# Patient Record
Sex: Female | Born: 1935
Health system: Southern US, Community
[De-identification: ages and names within clinical notes are randomized; demographics above are authoritative.]

## PROBLEM LIST (undated history)

## (undated) DIAGNOSIS — K5730 Diverticulosis of large intestine without perforation or abscess without bleeding: Secondary | ICD-10-CM

## (undated) DIAGNOSIS — R609 Edema, unspecified: Secondary | ICD-10-CM

## (undated) DIAGNOSIS — R143 Flatulence: Secondary | ICD-10-CM

## (undated) DIAGNOSIS — J3489 Other specified disorders of nose and nasal sinuses: Secondary | ICD-10-CM

## (undated) DIAGNOSIS — R142 Eructation: Secondary | ICD-10-CM

## (undated) DIAGNOSIS — K5289 Other specified noninfective gastroenteritis and colitis: Secondary | ICD-10-CM

## (undated) DIAGNOSIS — E785 Hyperlipidemia, unspecified: Secondary | ICD-10-CM

## (undated) DIAGNOSIS — H3530 Unspecified macular degeneration: Secondary | ICD-10-CM

## (undated) DIAGNOSIS — J069 Acute upper respiratory infection, unspecified: Secondary | ICD-10-CM

## (undated) DIAGNOSIS — I509 Heart failure, unspecified: Secondary | ICD-10-CM

## (undated) DIAGNOSIS — J189 Pneumonia, unspecified organism: Secondary | ICD-10-CM

## (undated) DIAGNOSIS — M810 Age-related osteoporosis without current pathological fracture: Secondary | ICD-10-CM

## (undated) DIAGNOSIS — R197 Diarrhea, unspecified: Secondary | ICD-10-CM

## (undated) DIAGNOSIS — K219 Gastro-esophageal reflux disease without esophagitis: Secondary | ICD-10-CM

## (undated) DIAGNOSIS — R220 Localized swelling, mass and lump, head: Secondary | ICD-10-CM

## (undated) DIAGNOSIS — K5150 Left sided colitis without complications: Secondary | ICD-10-CM

## (undated) DIAGNOSIS — I739 Peripheral vascular disease, unspecified: Secondary | ICD-10-CM

## (undated) DIAGNOSIS — K209 Esophagitis, unspecified without bleeding: Secondary | ICD-10-CM

## (undated) DIAGNOSIS — K222 Esophageal obstruction: Secondary | ICD-10-CM

## (undated) DIAGNOSIS — E859 Amyloidosis, unspecified: Secondary | ICD-10-CM

## (undated) DIAGNOSIS — R141 Gas pain: Secondary | ICD-10-CM

## (undated) DIAGNOSIS — Z800 Family history of malignant neoplasm of digestive organs: Secondary | ICD-10-CM

## (undated) DIAGNOSIS — E039 Hypothyroidism, unspecified: Secondary | ICD-10-CM

## (undated) DIAGNOSIS — C801 Malignant (primary) neoplasm, unspecified: Secondary | ICD-10-CM

## (undated) DIAGNOSIS — J8410 Pulmonary fibrosis, unspecified: Secondary | ICD-10-CM

## (undated) HISTORY — DX: Family history of malignant neoplasm of digestive organs: Z80.0

## (undated) HISTORY — DX: Other specified noninfective gastroenteritis and colitis: K52.89

## (undated) HISTORY — DX: Esophageal obstruction: K22.2

## (undated) HISTORY — DX: Left sided colitis without complications: K51.50

## (undated) HISTORY — DX: Amyloidosis, unspecified: E85.9

## (undated) HISTORY — DX: Diverticulosis of large intestine without perforation or abscess without bleeding: K57.30

## (undated) HISTORY — DX: Edema, unspecified: R60.9

## (undated) HISTORY — DX: Age-related osteoporosis without current pathological fracture: M81.0

## (undated) HISTORY — DX: Gastro-esophageal reflux disease without esophagitis: K21.9

## (undated) HISTORY — DX: Peripheral vascular disease, unspecified: I73.9

## (undated) HISTORY — DX: Pneumonia, unspecified organism: J18.9

## (undated) HISTORY — DX: Esophagitis, unspecified without bleeding: K20.90

## (undated) HISTORY — PX: FOOT SURGERY: SHX648

## (undated) HISTORY — DX: Eructation: R14.2

## (undated) HISTORY — DX: Gas pain: R14.3

## (undated) HISTORY — DX: Hypothyroidism, unspecified: E03.9

## (undated) HISTORY — DX: Unspecified macular degeneration: H35.30

## (undated) HISTORY — DX: Diarrhea, unspecified: R19.7

## (undated) HISTORY — DX: Hyperlipidemia, unspecified: E78.5

## (undated) HISTORY — PX: CATARACT EXTRACTION W/PHACO: SHX586

## (undated) HISTORY — PX: ABDOMINAL HYSTERECTOMY: SHX81

## (undated) HISTORY — DX: Esophagitis, unspecified: K20.9

## (undated) HISTORY — DX: Eructation: R14.1

## (undated) HISTORY — PX: CHOLECYSTECTOMY: SHX55

## (undated) HISTORY — PX: THYROIDECTOMY, PARTIAL: SHX18

## (undated) HISTORY — DX: Flatulence: R14.1

---

## 1997-02-07 ENCOUNTER — Encounter: Payer: Self-pay | Admitting: Gastroenterology

## 1998-11-26 ENCOUNTER — Ambulatory Visit (HOSPITAL_COMMUNITY): Admission: RE | Admit: 1998-11-26 | Discharge: 1998-11-27 | Payer: Self-pay | Admitting: *Deleted

## 1999-02-10 ENCOUNTER — Ambulatory Visit (HOSPITAL_COMMUNITY): Admission: RE | Admit: 1999-02-10 | Discharge: 1999-02-10 | Payer: Self-pay | Admitting: Gastroenterology

## 2002-06-28 ENCOUNTER — Ambulatory Visit (HOSPITAL_COMMUNITY): Admission: RE | Admit: 2002-06-28 | Discharge: 2002-06-28 | Payer: Self-pay | Admitting: Otolaryngology

## 2002-06-28 ENCOUNTER — Encounter: Payer: Self-pay | Admitting: Otolaryngology

## 2004-02-18 ENCOUNTER — Emergency Department (HOSPITAL_COMMUNITY): Admission: EM | Admit: 2004-02-18 | Discharge: 2004-02-18 | Payer: Self-pay | Admitting: Emergency Medicine

## 2004-02-18 ENCOUNTER — Encounter: Payer: Self-pay | Admitting: Emergency Medicine

## 2004-02-19 ENCOUNTER — Ambulatory Visit (HOSPITAL_BASED_OUTPATIENT_CLINIC_OR_DEPARTMENT_OTHER): Admission: RE | Admit: 2004-02-19 | Discharge: 2004-02-19 | Payer: Self-pay | Admitting: Orthopedic Surgery

## 2004-05-01 ENCOUNTER — Other Ambulatory Visit: Admission: RE | Admit: 2004-05-01 | Discharge: 2004-05-01 | Payer: Self-pay | Admitting: Dermatology

## 2004-05-01 ENCOUNTER — Ambulatory Visit: Payer: Self-pay | Admitting: Gastroenterology

## 2004-05-15 ENCOUNTER — Ambulatory Visit: Payer: Self-pay | Admitting: Gastroenterology

## 2004-05-15 DIAGNOSIS — K5730 Diverticulosis of large intestine without perforation or abscess without bleeding: Secondary | ICD-10-CM | POA: Insufficient documentation

## 2004-07-24 ENCOUNTER — Encounter (HOSPITAL_COMMUNITY): Admission: RE | Admit: 2004-07-24 | Discharge: 2004-08-23 | Payer: Self-pay | Admitting: Orthopedic Surgery

## 2004-09-18 ENCOUNTER — Ambulatory Visit: Payer: Self-pay | Admitting: Cardiology

## 2005-07-12 ENCOUNTER — Emergency Department (HOSPITAL_COMMUNITY): Admission: EM | Admit: 2005-07-12 | Discharge: 2005-07-13 | Payer: Self-pay | Admitting: Emergency Medicine

## 2005-07-27 ENCOUNTER — Ambulatory Visit: Payer: Self-pay | Admitting: Gastroenterology

## 2005-07-30 ENCOUNTER — Ambulatory Visit: Payer: Self-pay | Admitting: Gastroenterology

## 2005-08-07 ENCOUNTER — Ambulatory Visit: Payer: Self-pay | Admitting: Gastroenterology

## 2005-08-07 ENCOUNTER — Encounter (INDEPENDENT_AMBULATORY_CARE_PROVIDER_SITE_OTHER): Payer: Self-pay | Admitting: Specialist

## 2005-08-07 DIAGNOSIS — K5150 Left sided colitis without complications: Secondary | ICD-10-CM | POA: Insufficient documentation

## 2005-08-12 ENCOUNTER — Ambulatory Visit: Payer: Self-pay | Admitting: Gastroenterology

## 2005-08-28 ENCOUNTER — Ambulatory Visit: Payer: Self-pay | Admitting: Gastroenterology

## 2006-01-14 ENCOUNTER — Ambulatory Visit: Payer: Self-pay | Admitting: Gastroenterology

## 2006-01-19 ENCOUNTER — Ambulatory Visit: Payer: Self-pay | Admitting: Cardiology

## 2006-04-22 ENCOUNTER — Ambulatory Visit: Payer: Self-pay | Admitting: Cardiology

## 2006-09-11 ENCOUNTER — Emergency Department (HOSPITAL_COMMUNITY): Admission: EM | Admit: 2006-09-11 | Discharge: 2006-09-11 | Payer: Self-pay | Admitting: Emergency Medicine

## 2006-10-05 ENCOUNTER — Encounter: Admission: RE | Admit: 2006-10-05 | Discharge: 2006-10-05 | Payer: Self-pay | Admitting: Pulmonary Disease

## 2006-10-19 ENCOUNTER — Encounter: Admission: RE | Admit: 2006-10-19 | Discharge: 2006-10-19 | Payer: Self-pay | Admitting: Pulmonary Disease

## 2006-10-26 ENCOUNTER — Ambulatory Visit: Payer: Self-pay | Admitting: Gastroenterology

## 2007-03-21 ENCOUNTER — Ambulatory Visit: Payer: Self-pay | Admitting: Gastroenterology

## 2007-04-25 ENCOUNTER — Ambulatory Visit: Payer: Self-pay | Admitting: Cardiology

## 2007-05-09 DIAGNOSIS — E039 Hypothyroidism, unspecified: Secondary | ICD-10-CM | POA: Insufficient documentation

## 2007-05-09 DIAGNOSIS — K5289 Other specified noninfective gastroenteritis and colitis: Secondary | ICD-10-CM | POA: Insufficient documentation

## 2007-05-09 DIAGNOSIS — E785 Hyperlipidemia, unspecified: Secondary | ICD-10-CM | POA: Insufficient documentation

## 2007-05-09 DIAGNOSIS — K219 Gastro-esophageal reflux disease without esophagitis: Secondary | ICD-10-CM | POA: Insufficient documentation

## 2008-01-10 ENCOUNTER — Telehealth: Payer: Self-pay | Admitting: Gastroenterology

## 2008-04-25 ENCOUNTER — Encounter: Payer: Self-pay | Admitting: Gastroenterology

## 2008-06-27 ENCOUNTER — Ambulatory Visit: Payer: Self-pay | Admitting: Cardiology

## 2008-10-16 DIAGNOSIS — M810 Age-related osteoporosis without current pathological fracture: Secondary | ICD-10-CM | POA: Insufficient documentation

## 2008-10-16 DIAGNOSIS — R609 Edema, unspecified: Secondary | ICD-10-CM | POA: Insufficient documentation

## 2008-11-30 ENCOUNTER — Telehealth: Payer: Self-pay | Admitting: Gastroenterology

## 2009-01-03 ENCOUNTER — Ambulatory Visit: Payer: Self-pay | Admitting: Gastroenterology

## 2009-02-26 ENCOUNTER — Telehealth: Payer: Self-pay | Admitting: Gastroenterology

## 2009-04-24 ENCOUNTER — Encounter (INDEPENDENT_AMBULATORY_CARE_PROVIDER_SITE_OTHER): Payer: Self-pay | Admitting: *Deleted

## 2009-05-27 ENCOUNTER — Ambulatory Visit: Payer: Self-pay | Admitting: Gastroenterology

## 2009-05-27 DIAGNOSIS — K222 Esophageal obstruction: Secondary | ICD-10-CM | POA: Insufficient documentation

## 2009-06-06 ENCOUNTER — Ambulatory Visit: Payer: Self-pay | Admitting: Gastroenterology

## 2009-12-24 ENCOUNTER — Ambulatory Visit (HOSPITAL_COMMUNITY): Admission: RE | Admit: 2009-12-24 | Discharge: 2009-12-24 | Payer: Self-pay | Admitting: Pulmonary Disease

## 2010-03-01 ENCOUNTER — Emergency Department (HOSPITAL_COMMUNITY): Admission: EM | Admit: 2010-03-01 | Discharge: 2010-03-01 | Payer: Self-pay | Admitting: Emergency Medicine

## 2010-05-11 ENCOUNTER — Encounter: Payer: Self-pay | Admitting: Pulmonary Disease

## 2010-05-20 NOTE — Letter (Signed)
Summary: [facility] Instructions  [facility]  [place], [place] [address]   Phone: [phone]  Fax: [phone]       Cindy [name]    75-11-1932    MRN: [id number]        Procedure Day /Date:TUESDAY 06/04/2009     Arrival Time:1PM     Procedure Time:2PM     Location of Procedure:                    X   [facility] (4th Floor)   PREPARATION FOR COLONOSCOPY WITH MOVIPREP   Starting 5 days prior to your procedure 05/30/2009 do not eat nuts, seeds, popcorn, corn, beans, peas,  salads, or any raw vegetables.  Do not take any fiber supplements (e.g. Metamucil, Citrucel, and Benefiber).  THE DAY BEFORE YOUR PROCEDURE         DATE: 06/03/2009 DAY: MONDAY  1.  Drink clear liquids the entire day-NO SOLID FOOD  2.  Do not drink anything colored red or purple.  Avoid juices with pulp.  No orange juice.  3.  Drink at least 64 oz. (8 glasses) of fluid/clear liquids during the day to prevent dehydration and help the prep work efficiently.  CLEAR LIQUIDS INCLUDE: Water Jello Ice Popsicles Tea (sugar ok, no milk/cream) Powdered fruit flavored drinks Coffee (sugar ok, no milk/cream) Gatorade Juice: apple, white grape, white cranberry  Lemonade Clear bullion, consomm, broth Carbonated beverages (any kind) Strained chicken noodle soup Hard Candy                             4.  In the morning, mix first dose of MoviPrep solution:    Empty 1 Pouch A and 1 Pouch B into the disposable container    Add lukewarm drinking water to the top line of the container. Mix to dissolve    Refrigerate (mixed solution should be used within 24 hrs)  5.  Begin drinking the prep at 5:00 p.m. The MoviPrep container is divided by 4 marks.   Every 15 minutes drink the solution down to the next mark (approximately 8 oz) until the full liter is complete.   6.  Follow completed prep with 16 oz of clear liquid of your choice (Nothing red or purple).  Continue to  drink clear liquids until bedtime.  7.  Before going to bed, mix second dose of MoviPrep solution:    Empty 1 Pouch A and 1 Pouch B into the disposable container    Add lukewarm drinking water to the top line of the container. Mix to dissolve    Refrigerate  THE DAY OF YOUR PROCEDURE      DATE: 06/04/2009 DAY: TUESDAY  Beginning at 9a.m. (5 hours before procedure):         1. Every 15 minutes, drink the solution down to the next mark (approx 8 oz) until the full liter is complete.  2. Follow completed prep with 16 oz. of clear liquid of your choice.    3. You may drink clear liquids until 12:00PM(2 HOURS BEFORE PROCEDURE).   MEDICATION INSTRUCTIONS  Unless otherwise instructed, you should take regular prescription medications with a small sip of water   as early as possible the morning of your procedure.          OTHER INSTRUCTIONS  You will need a responsible adult at least 75 years of age to accompany you and  drive you home.   This person must remain in the waiting room during your procedure.  Wear loose fitting clothing that is easily removed.  Leave jewelry and other valuables at home.  However, you may wish to bring a book to read or  an iPod/MP3 player to listen to music as you wait for your procedure to start.  Remove all body piercing jewelry and leave at home.  Total time from sign-in until discharge is approximately 2-3 hours.  You should go home directly after your procedure and rest.  You can resume normal activities the  day after your procedure.  The day of your procedure you should not:   Drive   Make legal decisions   Operate machinery   Drink alcohol   Return to work  You will receive specific instructions about eating, activities and medications before you leave.    The above instructions have been reviewed and explained to me by   _______________________    I fully understand and can verbalize these instructions  _____________________________ Date _________

## 2010-05-20 NOTE — Progress Notes (Signed)
Summary: REFILL   Phone Note Call from Patient Call back at Home Phone [phone]   Caller: Patient Call For: [place] Reason for Call: Talk to Nurse Summary of Call: PT WANT TO [place]. PHARMACY IS SUPPOSE TO FAX SOMETHING TO NURSE Initial call taken by: Ronalee Red,  January 10, 2008 3:03 PM  Follow-up for Phone Call        RETURNED PTS CALL, SHE STATED SHE NEEDED A REFILL ON HER [place] FAXED OVER FROM [facility]. SENT IN RX BY FAX ALSO GAVE PT SAMPLES TO LAST TILL HER RX COMES IN Follow-up by: Genella Mech CMA,  January 10, 2008 4:22 PM

## 2010-05-20 NOTE — Miscellaneous (Signed)
Summary: Refill request from pharmacy  Clinical Lists Changes  Medications: Added new medication of LIALDA 1.2 GM TBEC (MESALAMINE) 2 tablets by mouth once daily - Signed Rx of LIALDA 1.2 GM TBEC (MESALAMINE) 2 tablets by mouth once daily;  #60 x 1;  Signed;  Entered by: Genella Mech CMA;  Authorized by: Inda Castle MD;  Method used: Printed then faxed to [facility] [place], [place], [place], [place]  [address], Ph: [phone], Fax: [phone]    Prescriptions: LIALDA 1.2 GM TBEC (MESALAMINE) 2 tablets by mouth once daily  #60 x 1   Entered by:   Genella Mech CMA   Authorized by:   Inda Castle MD   Signed by:   Genella Mech CMA on 04/25/2008   Method used:   Printed then faxed to ...       [facility] (retail)       [place] [address]       [place], [place]  [address]       Ph: [phone]       Fax: [phone]   RxID:   [id number]  Pt needs to call for a yearly follow up appointment

## 2010-05-20 NOTE — Progress Notes (Signed)
Summary: Lialda samples   Phone Note Call from Patient Call back at [facility] [phone]   Call For: DR [facility] Reason for Call: Talk to Nurse Summary of Call: Will be coming into town today-wonders if we have any Lialda samples she can pick up? Initial call taken by: Irwin Brakeman [facility],  November 30, 2008 9:05 AM  Follow-up for Phone Call        returned pts call. Informed her that she needed to make her a follow up appointment. She said when she comes in to get her samples she will make a follow up appointment then. She should be seen on a yearly basis for her colitis Follow-up by: Genella Mech CMA Deborra Medina),  November 30, 2008 9:34 AM

## 2010-05-20 NOTE — Procedures (Signed)
Summary: Gastroenterology Flex  Gastroenterology Flex   Imported By: Marlon Pel 05/09/2007 16:58:19  _____________________________________________________________________  External Attachment:    Type:   Image     Comment:   External Document

## 2010-05-20 NOTE — Procedures (Signed)
Summary: Gastroenterology EGD  Gastroenterology EGD   Imported By: Marlon Pel 05/09/2007 16:57:49  _____________________________________________________________________  External Attachment:    Type:   Image     Comment:   External Document

## 2010-05-20 NOTE — Progress Notes (Signed)
Summary: Sitting out in Wait Room-looking for samples   Phone Note Call from Patient   Caller: Pt is here sitting in [place] Call For: Dr Deatra Ina Reason for Call: Talk to Nurse Summary of Call: Her insurance does not want to cover her medicine. Wonders if we have samples of some alternatives she can try before buying. Initial call taken by: Irwin Brakeman [facility],  February 26, 2009 11:25 AM  Follow-up for Phone Call        Pts insurance will not cover her Lialda. She is suppose to have a colonoscopy is January. gave pt samples to last till her procedure Follow-up by: Genella Mech CMA Deborra Medina),  February 26, 2009 11:36 AM

## 2010-05-20 NOTE — Procedures (Signed)
Summary: Colonoscopy  Patient: Cindy [name] Note: All result statuses are Final unless otherwise noted.  Tests: (1) Colonoscopy (COL)   COL Colonoscopy           [facility]     [place], [place]  [address]           COLONOSCOPY PROCEDURE REPORT           PATIENT:  Cindy [name], Cindy [name]  MR#:  [id number]     BIRTHDATE:  Apr 21, 1932, 76 yrs. old  GENDER:  female           ENDOSCOPIST:  Sandy Salaam. Deatra Ina, MD     Referred by:           PROCEDURE DATE:  06/06/2009     PROCEDURE:  Colonoscopy, Diagnostic     ASA CLASS:  Class II     INDICATIONS:  Elevated Risk Screening, family history of colon     cancer Parent           MEDICATIONS:   Fentanyl 50 mcg IV, Versed 7 mg IV           DESCRIPTION OF PROCEDURE:   After the risks benefits and     alternatives of the procedure were thoroughly explained, informed     consent was obtained.  Digital rectal exam was performed and     revealed no abnormalities.   The LB [id number]-[id number] endoscope     was introduced through the anus and advanced to the cecum, which     was identified by both the appendix and ileocecal valve, without     limitations.  The quality of the prep was excellent, using     MoviPrep.  The instrument was then slowly withdrawn as the colon     was fully examined.     <<PROCEDUREIMAGES>>           FINDINGS:  Mild diverticulosis was found in the sigmoid colon (see     image1 and image11).  Scattered diverticula were found transverse     to sigmoid  This was otherwise a normal examination of the colon     (see image2, image4, image7, image9, image12, image14, and     image15).   Retroflexed views in the rectum revealed no     abnormalities.    The scope was then withdrawn from the patient     and the procedure completed.           COMPLICATIONS:  None           ENDOSCOPIC IMPRESSION:     1) Mild diverticulosis in the sigmoid colon     2) Diverticula, scattered in the transverse to  sigmoid     3) Otherwise normal examination     RECOMMENDATIONS:     1) Given your significant family history of colon cancer, you     should have a repeat colonoscopy in 5 years           REPEAT EXAM:  In 5 year(s) for Colonoscopy.           ______________________________     Sandy Salaam. Deatra Ina, MD           CC:  Velvet Bathe, MD           n.     Lorrin Mais:   Sandy Salaam. Alonie Gazzola at 06/06/2009 10:33 AM  Cindy [name], Cindy [name], [id number]  Note: An exclamation mark (!) indicates a result that was not dispersed into the flowsheet. Document Creation Date: 06/06/2009 10:33 AM _______________________________________________________________________  (1) Order result status: Final Collection or observation date-time: 06/06/2009 10:20 Requested date-time:  Receipt date-time:  Reported date-time:  Referring Physician:   Ordering Physician: Erskine Emery [phone]) Specimen Source:  Source: Cindy [name] Order Number: [id number] Lab site:   Appended Document: Colonoscopy    Clinical Lists Changes  Observations: Added new observation of COLONNXTDUE: 05/2014 (06/06/2009 11:04)

## 2010-05-20 NOTE — Letter (Signed)
Summary: Colonoscopy-Changed to Office Visit Letter  [facility]  [address] [place], [place] [address]   Phone: [phone]  Fax: [phone]      April 24, 2009 MRN: [id number]   Cindy [name] [address] [place], [place]  [address]   Dear Ms. Emory,   According to our records, it is time for you to schedule a Colonoscopy. However, after reviewing your medical record, I feel that an office visit would be most appropriate to more completely evaluate you and determine your need for a repeat procedure.  Please call [phone] (option #2) at your convenience to schedule an office visit. If you have any questions, concerns, or feel that this letter is in error, we would appreciate your call.   Sincerely,  Sandy Salaam. Deatra Ina, M.D.  [facility] [phone]

## 2010-05-20 NOTE — Assessment & Plan Note (Signed)
Summary: annual f/u...as.    History of Present Illness Visit Type: Follow-up Visit Primary GI MD: Erskine Emery MD [facility] Primary Provider: [name], MD Chief Complaint: Follow-up visit, No current GI problem, just here for her yearly visit.  Cindy [name] has returned for her annual visit for left-sided colitis.  She continues to do well on a regimen of lialda.  When she misses doses she claims that she can tell as evidenced by some looseness of her stools or lower abdominal discomfort.  She has had no bleeding.  Last colonoscopy in 2006 demonstrated diverticulosis.   GI Review of Systems      Denies abdominal pain, acid reflux, belching, bloating, chest pain, dysphagia with liquids, dysphagia with solids, heartburn, loss of appetite, nausea, vomiting, vomiting blood, weight loss, and  weight gain.        Denies anal fissure, black tarry stools, change in bowel habit, constipation, diarrhea, diverticulosis, fecal incontinence, heme positive stool, hemorrhoids, irritable bowel syndrome, jaundice, light color stool, liver problems, rectal bleeding, and  rectal pain.    Current Medications (verified): 1)  Lialda 1.2 Gm Tbec (Mesalamine) .... 2 Tablets By Mouth Once Daily 2)  Synthroid 25 Mcg Tabs (Levothyroxine Sodium) .... Take One By Mouth Once Daily 3)  Protonix 40 Mg Tbec (Pantoprazole Sodium) .... Take One By Mouth Once Daily  Allergies (verified): 1)  ! Sulfa 2)  ! * Codine  Past History:  Past Medical History: Reviewed history from 10/16/2008 and no changes required. OSTEOPOROSIS (ICD-733.00) EDEMA (ICD-782.3) HYPERLIPIDEMIA (ICD-272.4) HYPOTHYROIDISM (ICD-244.9) DIARRHEA (ICD-787.91) COLITIS (ICD-558.9) CARCINOMA, COLON, FAMILY HX (ICD-V16.0) ABDOMINAL BLOATING (ICD-787.3) GERD (ICD-530.81) ESOPHAGEAL STRICTURE (ICD-530.3) ESOPHAGITIS (ICD-530.10) DIVERTICULOSIS, COLON (ICD-562.10) COLITIS, ULCERATIVE, UNIVERSAL (ICD-556.6)  Past Surgical History: Abdominal  Hysterectomy-Total Cholecystectomy Thyroidectomy  Family History: Family History of Coronary Artery Disease:  Family History of Diabetes: son, neice, brothers Family History of Colon Cancer:mother Family History of Stomach Cancer:sister  Social History: Reviewed history from 10/16/2008 and no changes required. Retired  Married  Tobacco Use - No.  Alcohol Use - no  Review of Systems  The patient denies allergy/sinus, anemia, anxiety-new, arthritis/joint pain, back pain, blood in urine, breast changes/lumps, change in vision, confusion, cough, coughing up blood, depression-new, fainting, fatigue, fever, headaches-new, hearing problems, heart murmur, heart rhythm changes, itching, menstrual pain, muscle pains/cramps, night sweats, nosebleeds, pregnancy symptoms, shortness of breath, skin rash, sleeping problems, sore throat, swelling of feet/legs, swollen lymph glands, thirst - excessive , urination - excessive , urination changes/pain, urine leakage, vision changes, and voice change.    Vital Signs:  Patient profile:   77 year old female Height:      68 inches Weight:      155.8 pounds BMI:     23.77 Pulse rate:   76 / minute Pulse rhythm:   regular BP sitting:   110 / 60  (left arm) Cuff size:   regular  Vitals Entered By: Bernita Buffy CMA ([place]) (January 03, 2009 9:30 AM)  Physical Exam  Additional Exam:  On physical exam she is a healthy-appearing female  skin: anicteric HEENT: normocephalic; PEERLA; no nasal or pharyngeal abnormalities neck: supple nodes: no cervical lymphadenopathy chest: clear to ausculatation and percussion heart: no murmurs, gallops, or rubs abd: soft, nontender; BS normoactive; no abdominal masses, tenderness, organomegaly rectal: deferred ext: no cynanosis, clubbing, edema skeletal: no deformities neuro: oriented x 3; no focal abnormalities    Impression & Recommendations:  Problem # 1:  COLITIS (ICD-558.9) She has left-sided colitis  diagnosed by  sigmoidoscopy in 2007.  She remains in clinical remission on lialda.  Recommendations #1 continue current regimen  Problem # 2:  CARCINOMA, COLON, FAMILY HX (ICD-V16.0) She has a sibling with colon cancer  Recommendations #1 followup colonoscopy in January, 2011  Patient Instructions: 1)  cc Dr. [name]

## 2010-05-20 NOTE — Assessment & Plan Note (Signed)
Summary: OV RECALL...AS.    History of Present Illness Visit Type: Follow-up Visit Primary GI MD: Erskine Emery MD [facility] Primary Provider: [name], MD Chief Complaint: Schedule colon screening History of Present Illness:   Cindy [name] is return for follow up of her left-sided colitis.  Family history is positive for colon cancer.  She was last examined by colonoscopy 5 years ago.  She continues on lialda 2.4 g daily.  Unfortunately, her insurance will no longer cover this medication.  She has no current GI complaints.   GI Review of Systems      Denies abdominal pain, acid reflux, belching, bloating, chest pain, dysphagia with liquids, dysphagia with solids, heartburn, loss of appetite, nausea, vomiting, vomiting blood, weight loss, and  weight gain.        Denies anal fissure, black tarry stools, change in bowel habit, constipation, diarrhea, diverticulosis, fecal incontinence, heme positive stool, hemorrhoids, irritable bowel syndrome, jaundice, light color stool, liver problems, rectal bleeding, and  rectal pain.    Current Medications (verified): 1)  Lialda 1.2 Gm Tbec (Mesalamine) .... 2 Tablets By Mouth Once Daily 2)  Synthroid 25 Mcg Tabs (Levothyroxine Sodium) .... Take One By Mouth Once Daily 3)  Protonix 40 Mg Tbec (Pantoprazole Sodium) .... Take One By Mouth Once Daily  Allergies (verified): 1)  ! Sulfa 2)  ! * Codine  Past History:  Past Medical History: Reviewed history from 10/16/2008 and no changes required. OSTEOPOROSIS (ICD-733.00) EDEMA (ICD-782.3) HYPERLIPIDEMIA (ICD-272.4) HYPOTHYROIDISM (ICD-244.9) DIARRHEA (ICD-787.91) COLITIS (ICD-558.9) CARCINOMA, COLON, FAMILY HX (ICD-V16.0) ABDOMINAL BLOATING (ICD-787.3) GERD (ICD-530.81) ESOPHAGEAL STRICTURE (ICD-530.3) ESOPHAGITIS (ICD-530.10) DIVERTICULOSIS, COLON (ICD-562.10) COLITIS, ULCERATIVE, UNIVERSAL (ICD-556.6)  Past Surgical History: Reviewed history from 01/03/2009 and no changes  required. Abdominal Hysterectomy-Total Cholecystectomy Thyroidectomy  Family History: Reviewed history from 01/03/2009 and no changes required. Family History of Coronary Artery Disease:  Family History of Diabetes: son, neice, brothers Family History of Colon Cancer:mother Family History of Stomach Cancer:sister  Social History: Reviewed history from 10/16/2008 and no changes required. Retired  Married  Tobacco Use - No.  Alcohol Use - no  Review of Systems       The patient complains of allergy/sinus.    Vital Signs:  Patient profile:   77 year old female Height:      68 inches Weight:      159.38 pounds BMI:     24.32 Pulse rate:   60 / minute Pulse rhythm:   regular BP sitting:   110 / 70  (left arm) Cuff size:   regular  Vitals Entered By: June McMurray CMA Deborra Medina) (May 27, 2009 10:04 AM)   Impression & Recommendations:  Problem # 1:  COLITIS (ICD-558.9) Patient remains in clinical remission.  Mentation is #1 switch from lialda to apriso 1.5 g daily  Problem # 2:  CARCINOMA, COLON, FAMILY HX (ICD-V16.0)  Plan followup colonoscopy  Orders: Colonoscopy (Colon)  Patient Instructions: 1)  cc Dr. [name] 2)  Your colonoscopy is scheduled for 06/04/2009 at 2pm 3)  We are sending your MoviPrep to your pharmacy today 4)  We have sent your your Apriso to your mail order pharmacy 5)  The medication list was reviewed and reconciled.  All changed / newly prescribed medications were explained.  A complete medication list was provided to the patient / caregiver. Prescriptions: MOVIPREP 100 GM  SOLR (PEG-KCL-NACL-NASULF-NA ASC-C) As per prep instructions.  #1 x 0   Entered by:   Genella Mech CMA (AAMA)   Authorized  by:   Inda Castle MD   Signed by:   Genella Mech CMA ([place]) on 05/27/2009   Method used:   Electronically to        [facility] (retail)       [place] [address]       [place], [place]  [address]        Ph: [phone]       Fax: [phone]   RxID:   [id number] APRISO 0.375 GM XR24H-CAP (MESALAMINE) TAKE 4 by mouth once daily  #360 x 3   Entered by:   Genella Mech CMA ([place])   Authorized by:   Inda Castle MD   Signed by:   Genella Mech CMA ([place]) on 05/27/2009   Method used:   Faxed to ...       [facility]order)             , [place]         Ph:        Fax: [phone]   RxID:   [id number]

## 2010-05-20 NOTE — Procedures (Signed)
Summary: Gastroenterology Colon  Gastroenterology Colon   Imported By: Marlon Pel 05/09/2007 16:57:12  _____________________________________________________________________  External Attachment:    Type:   Image     Comment:   External Document

## 2010-07-01 LAB — URINALYSIS, ROUTINE W REFLEX MICROSCOPIC
Bilirubin Urine: NEGATIVE
Glucose, UA: NEGATIVE mg/dL
Ketones, ur: NEGATIVE mg/dL
Nitrite: NEGATIVE
Protein, ur: NEGATIVE mg/dL
Specific Gravity, Urine: <1.005 — ABNORMAL LOW (ref 1.005–1.030)
Urobilinogen, UA: 0.2 mg/dL (ref 0.0–1.0)
pH: 5.5 (ref 5.0–8.0)

## 2010-07-01 LAB — CBC
HCT: 37.7 % (ref 36.0–46.0)
Hemoglobin: 12.7 g/dL (ref 12.0–15.0)
MCH: 31.6 pg (ref 26.0–34.0)
MCHC: 33.7 g/dL (ref 30.0–36.0)
MCV: 93.7 fL (ref 78.0–100.0)
Platelets: 187 10*3/uL (ref 150–400)
RBC: 4.03 MIL/uL (ref 3.87–5.11)
RDW: 12.8 % (ref 11.5–15.5)
WBC: 4.6 10*3/uL (ref 4.0–10.5)

## 2010-07-01 LAB — URINE MICROSCOPIC-ADD ON

## 2010-07-01 LAB — DIFFERENTIAL
Basophils Absolute: 0 10*3/uL (ref 0.0–0.1)
Basophils Relative: 0 % (ref 0–1)
Eosinophils Absolute: 0.1 10*3/uL (ref 0.0–0.7)
Eosinophils Relative: 2 % (ref 0–5)
Lymphocytes Relative: 36 % (ref 12–46)
Lymphs Abs: 1.7 10*3/uL (ref 0.7–4.0)
Monocytes Absolute: 0.5 10*3/uL (ref 0.1–1.0)
Monocytes Relative: 12 % (ref 3–12)
Neutro Abs: 2.3 10*3/uL (ref 1.7–7.7)
Neutrophils Relative %: 50 % (ref 43–77)

## 2010-07-01 LAB — COMPREHENSIVE METABOLIC PANEL
ALT: 14 U/L (ref 0–35)
AST: 23 U/L (ref 0–37)
Albumin: 3.5 g/dL (ref 3.5–5.2)
Alkaline Phosphatase: 51 U/L (ref 39–117)
BUN: 8 mg/dL (ref 6–23)
CO2: 23 mEq/L (ref 19–32)
Calcium: 8.2 mg/dL — ABNORMAL LOW (ref 8.4–10.5)
Chloride: 100 mEq/L (ref 96–112)
Creatinine, Ser: 0.57 mg/dL (ref 0.4–1.2)
GFR calc Af Amer: >60 mL/min (ref >60)
GFR calc non Af Amer: >60 mL/min (ref >60)
Glucose, Bld: 111 mg/dL — ABNORMAL HIGH (ref 70–99)
Potassium: 3.5 mEq/L (ref 3.5–5.1)
Sodium: 130 mEq/L — ABNORMAL LOW (ref 135–145)
Total Bilirubin: 0.5 mg/dL (ref 0.3–1.2)
Total Protein: 6.1 g/dL (ref 6.0–8.3)

## 2010-09-02 NOTE — Letter (Signed)
June 27, 2008    [facility] [address], [place]  [place], [place] [address]   RE:  Cindy, [name]  MRN:  [id number]  /  DOB:  1932/07/10   Dear Cindy [name],   Cindy [name] returns to the office for continued assessment and treatment  of moderately severe hyperlipidemia.  She has had LDL levels around 170-  190.  Unfortunately, she has failed to tolerate any number of  medications including multiple statins, ezetimibe, and now WelChol.  She  started that at 1 tablet t.i.d., advanced to 2 tablets t.i.d., but  subsequently developed myalgias prompting her to reduce the dose to 2  tablets nightly.  Her most recent lipids show a total cholesterol of  286, triglycerides of 74, HDL of 82, and LDL of 189.   She continues to enjoy generally good health.  Ulcerative colitis is  under control with her current medication.  She has been told of  osteopenia or osteoporosis in the past, but this has not been checked  for a number of year.  She apparently did not require pharmacologic  agents in the past.  She has been taking calcium and vitamin D.  Her  gynecologist apparently added higher doses of vitamin D.   PHYSICAL EXAMINATION:  GENERAL:  Trim pleasant woman in no acute  distress.  VITAL SIGNS:  The weight is 162, 2 pounds less than last year.  Blood  pressure 115/70, heart rate 65 and regular, respirations 12 and  unlabored.  NECK:  No jugular venous distention; no carotid bruits.  LUNGS:  Straight back; clear lung fields.  CARDIAC:  Normal first and second heart sounds; minimal systolic  ejection murmur.  ABDOMEN:  Soft and nontender; no bruits.  EXTREMITIES:  No edema; normal distal pulses.   IMPRESSION:  Cindy [name] is doing well in terms of her overall health.  Isolated hyperlipidemia is not highly predictive of vascular disease.  It appears that further pharmacologic trials are pointless.  She will  continue her current regime which consists of red yeast rice, coenzyme  Q, fish oil, and  low-dose WelChol.  You may wish to repeat bone  densitometry to determine if she requires pharmacologic therapy of her  osteoporosis.  I will be happy to see this woman at any time in the  future that you deem appropriate.    Sincerely,      Cristopher Estimable. Lattie Haw, MD, [facility]  Electronically Signed    RMR/MedQ  DD: 06/27/2008  DT: 06/28/2008  Job #: [id number]

## 2010-09-02 NOTE — Assessment & Plan Note (Signed)
[facility] OFFICE NOTE   NAME:Morriss, Bridgette Habermann                       MRN:          [id number]  DATE:10/26/2006                            DOB:          1932/07/01    PROBLEM:  Left-sided colitis.   Ms. Elbert has returned for scheduled GI followup.  On Lialda 2.4 grams  a day, she continues to do extremely well.  She is in clinical  remission.  On occasion when she has missed 2 or 3 days of medicine she  has noted a change in her bowel habits.  Pyrosis is very well controlled  with daily Prevacid.  She has no other complaints.   EXAMINATION:  Pulse 76, blood pressure 100/58, weight 156.   IMPRESSION:  1. Left-sided colitis - in remission.  2. Gastroesophageal reflux disease.   RECOMMENDATIONS:  Patient will consider enrollment in IBD trial.  She  will continue with her current medications.     Sandy Salaam. Deatra Ina, MD,FACG  Electronically Signed    RDK/MedQ  DD: 10/26/2006  DT: 10/26/2006  Job #: [id number]   cc:   Percell Miller L. [facility], M.D.

## 2010-09-02 NOTE — Assessment & Plan Note (Signed)
[facility] OFFICE NOTE   NAME:[name], Cindy Habermann                       MRN:          [id number]  DATE:03/21/2007                            DOB:          [birth year]    PROBLEM LIST:  Diverticulitis.   [name] has returned for a scheduled followup.  She continues to  remain in clinical remission.  She has actually lowered her dose to 1.2  g of Lialda daily because of insurance considerations.  She has no GI  complaints.   PHYSICAL EXAMINATION:  Pulse 68, blood pressure 112/70, weight 159.   IMPRESSION:  Left-sided colitis--in remission.   RECOMMENDATIONS:  Continue Lialda.  I would like her to take 2.4 g a day  since this is the standard dose.     Sandy Salaam. Deatra Ina, MD,FACG  Electronically Signed    RDK/MedQ  DD: 03/21/2007  DT: 03/21/2007  Job #: [id number]   cc:   Percell Miller L. Luan Pulling, M.D.

## 2010-09-02 NOTE — Letter (Signed)
April 25, 2007    [place] Cindy [name], M.D.  [address]  [place]  [place] [address]   RE:  Cindy [name]  MRN:  [id number]  /  DOB:  [birth year]   Dear Cindy [name],   Cindy [name] returns the office 6 months beyond her scheduled appointment.  She decided to try nutritional supplements instead of pharmacologic  agents for her hyperlipidemia after finding that cholestyramine was too  much trouble.  She has been taking red yeast rice, coenzyme Q, and fish  oil.  Not surprisingly, there has been no improvement in her lipid  profile.  A recent total cholesterol was 269 with HDL of 83 and LDL of  165.  She describes no cardiopulmonary symptoms other than peripheral  edema, which is intermittent and mild.   CURRENT MEDICATIONS:  Unchanged from her last visit except for the  agents noted above.   PHYSICAL EXAMINATION:  On exam, a pleasant trim woman in no acute  distress.  The weight is 164, 6 pounds more than last year.  The blood  pressure 110/70, heart rate 70 and regular, respirations 18.  NECK:  There were no jugular venous distention; no carotid bruits.  LUNGS:  Clear.  CARDIAC:  Normal first and second heart sound; grade 3-3/0  basilar systolic ejection murmur.  ABDOMEN:  Soft and nontender; no  organomegaly.  EXTREMITIES:  No edema.   IMPRESSION:  Cindy [name] is doing well overall.  I am beginning to think  that our efforts to manage her hyperlipidemia are fruitless, and likely  doomed to failure.  She would like to try one more pharmacologic agent.  We will treat her with Welchol 1 t.i.d., and then 2 t.i.d. with meals if  well tolerated.  A lipid profile will be obtained in 3 months with a  return office visit 9 months thereafter.   We discussed measures for control of her mild pedal edema.  I believe  this reflects venous disease rather than heart disease.    Sincerely,      Cristopher Estimable. Lattie Haw, MD, [facility]  Electronically Signed    RMR/MedQ  DD: 04/25/2007  DT: 04/25/2007  Job #:  [id number]

## 2010-09-05 NOTE — Letter (Signed)
April 22, 2006    Percell Miller L. Luan Pulling, M.D.  [address]  [place], [place] [address]   RE:  Cindy [name], Cindy [name]  MRN:  [id number]  /  DOB:  [birth year]   Dear Ed:   [name] returns to the office for treatment of hyperlipidemia.  She  took pravastatin 20 mg daily with good results over the past 3 months,  but suffered leg discomfort.  She has discontinued the medicine over the  past 3 days with marked improvement of her symptoms.  She also continues  to be concerned about the possibility of hepatic damage.  Otherwise, she  has done well since her last visit without any significant medical  issues.   MEDICATIONS:  Are unchanged from her last visit except for the temporary  addition of pravastatin.   EXAMINATION:  A pleasant, trim woman in no acute distress.  The weight is 158, 4 pounds more than in October.  Blood pressure  110/70, heart rate 64 and regular.  Respirations 16.  NECK:  No jugular venous distention; normal carotid upstrokes without  bruits.  CARDIAC:  Normal 1st and 2nd heart sounds; 4th heart sound present.  ABDOMEN:  Soft and nontender; no bruits.  LUNGS:  Clear.  EXTREMITIES:  No edema; normal distal pulses.   IMPRESSION:  [name] has not tolerated 2 statins.  We could try a  third drug at low dose on a every other day basis, but I doubt that this  will be successful.  We will start cholestyramine at a dose of 4 g  t.i.d.  A repeat lipid profile will be obtained in 1 month.  At that  point, I suspect that the addition of ezetimibe will be necessary.  I  will plan a return office visit in 6 months.    Sincerely,      Cristopher Estimable. Lattie Haw, MD, [facility]  Electronically Signed    RMR/MedQ  DD: 04/22/2006  DT: 04/22/2006  Job #: [id number]

## 2010-09-05 NOTE — Op Note (Signed)
Cindy [name], Cindy [name]                ACCOUNT NO.:  [id number]   MEDICAL RECORD NO.:  [id number]          PATIENT TYPE:  AMB   LOCATION:  [place]                          FACILITY:  [facility]   PHYSICIAN:  Youlanda Mighty. Sypher Brooke Bonito., M.D.DATE OF BIRTH:  08/04/1932   DATE OF PROCEDURE:  02/19/2004  DATE OF DISCHARGE:                                 OPERATIVE REPORT   PREOPERATIVE DIAGNOSIS:  Comminuted fracture of right distal radius with  significant impaction and dorsal angulation.   POSTOPERATIVE DIAGNOSIS:  Comminuted fracture of right distal radius with  significant impaction and dorsal angulation.   OPERATION:  Open reduction and internal fixation of right radius utilizing a  seven peg DVR plate system.   SURGEON:  Youlanda Mighty. Sypher, M.D.   ASSISTANT:  Leverne Humbles, P.A.-C.   ANESTHESIA:  General by LMA/infraclavicular block.   ANESTHESIOLOGIST:  Jessy Oto. Albertina Parr, M.D.   INDICATIONS FOR PROCEDURE:  Cindy [name] is a 77 year old woman who fell on  the afternoon of February 18, 2004, and sustained an obvious fracture of her  right wrist.  She was initially evaluated at [facility] in  [place] where x-rays revealed a comminuted fracture.  She subsequently  was transferred to [facility] emergency room  where she was seen by Dr. Zenia Resides,  attending emergency room physician.  Dr. Zenia Resides reviewed her films and  obtained a hand trauma consult.  After informed consent, she was placed in a  sugar tong splint as she was not n.p.o.  We made arrangements for open  reduction and internal fixation of her wrist at this time.  We advised Ms.  [name] to proceed with open reduction and internal fixation to obtain and  maintain a near anatomic reduction of her fracture and to facilitate early  mobilization of her wrist and hand.  After informed consent, she is brought  to the operating room at this time.   PROCEDURE:  Cindy [name] is brought to the operating room and placed in  supine  position on the operating table.  Following anesthesia consultation  by Dr. Albertina Parr, an infraclavicular block was placed.  She had some  residual discomfort and was uncomfortable lying upon the operating table,  therefore, it was decided to proceed with general anesthesia by LMA.  1 gram  Ancef was administered as an IV prophylactic antibiotic.  Following  exsanguination of the right arm with an Esmarch bandage, the arterial  tourniquet on the proximal brachium was inflated to 230 mmHg.  The procedure  commenced with a standard DVR volar incision.  The subcutaneous tissues were  carefully divided taking care to identify and electrocauterize transverse  veins.  The fascia overlying the flexor carpi radialis was incised  longitudinally followed by release of the floor of the flexor carpi radialis  compartment.  The flexor pollicis longus was retracted in an ulnar direction  and the radial artery gently retracted in a radial direction.  The pronator  quadratus was elevated off the fracture site.  Clot and other debris was  cleared from the fracture site followed by three point molding with traction  to obtain an anatomic reduction.  A seven peg DVR plate was placed with  provisional fixation with a single screw in the sliding slot.  Anatomic  position of the plate was achieved.  The plate was then snugged down and we  proceeded to apply the seven pegs.  A combination of threaded pegs were used  in the proximal row particularly in the radial styloid and smooth pegs on  the ulnar aspect of the fracture.  During tightening of the plate, we got  out of phase, perhaps 3 degrees with respect to the position of the plate  via the radial endplate, however, this was inconsequential and ultimately  excellent fixation of the pegs was achieved anatomically securing the distal  fracture fragments.  The advantage of this position was that we had better  support of the lunate facet with the plate in a  slightly radial rotated  position.  The wound was then thoroughly lavaged with sterile saline  followed by placement of the remaining three cortical screws to secure the  plate to the shaft of the radius.  The pronator quadratus was then repaired  with a mattress suture of 0 Vicryl to the brachial radialis fascia and  periosteum followed by repair of the skin with subdermal sutures of 3-0  Vicryl and intradermal 3-0 Prolene.  A compressive dressing was applied with  the sugar tong splint maintaining the wrist in slight supination.   For aftercare, Cindy [name] has been given prescriptions for Percocet and  Motrin in the emergency room .  She is given Keflex 500 mg 1 p.o. q.8h. x 4  days as a prophylactic antibiotic.      [place]   RVS/MEDQ  D:  02/19/2004  T:  02/19/2004  Job:  [id number]   cc:   Percell Miller L. Luan Pulling, M.D.  [place]  [place] [address]  Fax: [phone]

## 2010-09-05 NOTE — Consult Note (Signed)
NAMETREVA, HUYETT NO.:  [id number]   MEDICAL RECORD NO.:  [id number]          PATIENT TYPE:  EMS   LOCATION:  [place]                         FACILITY:  [facility]   PHYSICIAN:  Youlanda Mighty. Sypher Brooke Bonito., M.D.DATE OF BIRTH:  1933/02/25   DATE OF CONSULTATION:  02/18/2004  DATE OF DISCHARGE:                                   CONSULTATION   CHIEF COMPLAINT:  Pain right wrist status post fall.   REFERRING PHYSICIAN:  Dr. Elberta Fortis T. Zenia Resides, attending emergency room  physician.   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  She developed immediate pain, swelling,  ecchymosis and deformity.  She was transported to [facility] emergency room  where she was evaluated by Dr. Lacretia Leigh who made the clinical diagnosis  of a probable wrist fracture.   X-rays were obtained documenting a comminuted intra-articular displaced  dorsally angulated and impacted fracture of the radius and an ulnar styloid  fracture.   Dr. Zenia Resides requested an upper extremity orthopedic consult.   PAST MEDICAL HISTORY:  Cindy [name]' past medical history is briefly reviewed.  She is basically healthy.   CLINICAL EXAMINATION:  Confirmed that she is an awake and alert 77 year old  woman. Inspection of her arm revealed profound swelling, ecchymosis and a  silver fork deformity.   She was noted to have intact sensibility in the medial ulnar and radial  distribution and intact function of her extrinsic and intrinsic muscles  innervated by the median ulnar and radial nerves.   After informed consent with her son and husband, I recommended proceeding  with casting in situ followed by anticipated reconstruction of her wrist  with a DVR volar plate system.   Arrangements will be made for her to be admitted to the outpatient center in  24 to 48 hours for elective reconstruction  of her wrist after she is n.p.o.  and properly evaluated from a medical stand-point.   After informed consent she was placed in a sugar tong splint maintaining her  fracture in situ.  Once this set she was advised to begin icing.  She was  given prescriptions for Vicodin 5 mg 1 p.o. q.4-6h p.r.n. pain #6 tablets  from the emergency room as a pre-pack.  Also a prescription for Percocet 5/325 1 p.o. q.4h p.r.n. pain #24 tablets  without refill, and Motrin 600 mg 1 p.o. q.6h p.r.n. pain, a total of 20  tablets with one refill.   We will contact her by phone within 24 hours to make arrangements for her  elective reconstructive surgery.      [place]   RVS/MEDQ  D:  02/18/2004  T:  02/19/2004  Job:  [id number]

## 2010-09-05 NOTE — Letter (Signed)
January 19, 2006     Renard Matter, M.D.  [address] [place], [place] [address]   RE:  ALOURA, MATSUOKA  MRN:  [id number]  /  DOB:  June 11, 1932   Dear Cindy [name]:   It is my pleasure reassessing Cindy [name] in the office today at your  request.  As you know, I saw her approximately a year ago for  hyperlipidemia.  She has been reluctant to treat this pharmacologically due  to a previous adverse reaction to atorvastatin.  At the time, I felt  continued dietary measures were appropriate.  She now returns with an even  higher total cholesterol and LDL as well as a lower HDL level.  She remains  asymptomatic.  There is no history of vascular disease.  Updating interval  events, she has had a fairly severe episode of ulcerative colitis that has  now been controlled with medical therapy.   Current medications are unchanged from her last visit except for the  addition of a medication for her inflammatory bowel disease.   PHYSICAL EXAMINATION:  GENERAL:  A trim, pleasant woman.  The weight is 154,  1 pound less than last year.  Blood pressure 100/60, heart rate 80 and  regular, respirations 16.  NECK:  No jugular venous distention; normal carotid upstrokes without  bruits.  LUNGS:  Clear.  CARDIAC:  Normal first and second heart sounds; prominent fourth heart  sound.  ABDOMEN:  Soft and nontender; aortic pulsation not palpable; no bruits.  EXTREMITIES:  No vascular bruits; normal distal pulses.   The most recent lipid profile shows total cholesterol of 288, LDL of 182,  HDL of 88.  Triglycerides are 89.   IMPRESSION:  Cindy [name] remains free of evidence for vascular disease and  cardiovascular risk factors except for her dyslipidemia, her age, and her  use of Premarin.  The latter is undoubtedly contributing to her high HDL  value.  She approaches treatment recommendations for LDL, for which values  above 190 can be treated pharmacologically.  I suggested that we try  Pravastatin  at a low dose.  A repeat lipid profile will be obtained in one  month.  I will reassess this nice woman again in three months.    Sincerely,     Cristopher Estimable. Lattie Haw, MD, [facility]     RMR/MedQ  DD:  01/19/2006  DT:  01/20/2006  Job #:  [id number]   CC:    Percell Miller L. Luan Pulling, M.D.

## 2010-09-05 NOTE — Assessment & Plan Note (Signed)
[facility] OFFICE NOTE   NAME:[name], Cindy Beam                       MRN:          [id number]  DATE:01/14/2006                            DOB:          27-Oct-1932    PROBLEM:  Left sided colitis.   Cindy [name] has returned for scheduled follow up.  On Lialda 2.4 grams a day,  she is feeling quite well.  She is in clinical remission.   PHYSICAL EXAMINATION:  VITAL SIGNS:  Pulse 72, blood pressure 122/64, weight  156.   IMPRESSION:  Left sided colitis - in remission.   RECOMMENDATIONS:  Continue Lialda.       Sandy Salaam. Deatra Ina, MD,FACG      RDK/MedQ  DD:  01/14/2006  DT:  01/15/2006  Job #:  [id number]   cc:   Percell Miller L. Luan Pulling, M.D.

## 2010-10-14 ENCOUNTER — Other Ambulatory Visit: Payer: Self-pay | Admitting: Gastroenterology

## 2010-10-14 MED ORDER — MESALAMINE ER 0.375 G PO CP24: 375.0000 mg | ORAL_CAPSULE | Freq: Every day | ORAL | Status: DC

## 2010-10-14 NOTE — Telephone Encounter (Signed)
MEDICATION SENT TO [facility]

## 2010-12-09 ENCOUNTER — Emergency Department (HOSPITAL_COMMUNITY): Payer: Medicare Other

## 2010-12-09 ENCOUNTER — Emergency Department (HOSPITAL_COMMUNITY)
Admission: EM | Admit: 2010-12-09 | Discharge: 2010-12-09 | Disposition: A | Discharge status: Home / Self Care | Payer: Medicare Other | Attending: Emergency Medicine | Admitting: Emergency Medicine

## 2010-12-09 DIAGNOSIS — IMO0002 Reserved for concepts with insufficient information to code with codable children: Secondary | ICD-10-CM | POA: Insufficient documentation

## 2010-12-09 DIAGNOSIS — W19XXXA Unspecified fall, initial encounter: Secondary | ICD-10-CM | POA: Insufficient documentation

## 2010-12-09 DIAGNOSIS — R1011 Right upper quadrant pain: Secondary | ICD-10-CM | POA: Insufficient documentation

## 2010-12-09 DIAGNOSIS — M25569 Pain in unspecified knee: Secondary | ICD-10-CM | POA: Insufficient documentation

## 2010-12-09 DIAGNOSIS — R079 Chest pain, unspecified: Secondary | ICD-10-CM | POA: Insufficient documentation

## 2010-12-09 DIAGNOSIS — R1013 Epigastric pain: Secondary | ICD-10-CM | POA: Insufficient documentation

## 2010-12-09 DIAGNOSIS — T07XXXA Unspecified multiple injuries, initial encounter: Secondary | ICD-10-CM

## 2010-12-09 LAB — BASIC METABOLIC PANEL
BUN: 12 mg/dL (ref 6–23)
CO2: 28 mEq/L (ref 19–32)
Calcium: 9.4 mg/dL (ref 8.4–10.5)
Chloride: 100 mEq/L (ref 96–112)
Creatinine, Ser: 0.58 mg/dL (ref 0.50–1.10)
GFR calc Af Amer: >60 mL/min (ref >60)
GFR calc non Af Amer: >60 mL/min (ref >60)
Glucose, Bld: 91 mg/dL (ref 70–99)
Potassium: 4.4 mEq/L (ref 3.5–5.1)
Sodium: 135 mEq/L (ref 135–145)

## 2010-12-09 MED ORDER — SODIUM CHLORIDE 0.9 % IV SOLN
Freq: Once | INTRAVENOUS | Status: AC
  Administered 2010-12-09: 16:00:00 via INTRAVENOUS

## 2010-12-09 MED ORDER — OXYCODONE-ACETAMINOPHEN 5-325 MG PO TABS
1.0000 | ORAL_TABLET | Freq: Once | ORAL | Status: AC
  Administered 2010-12-09: 1 via ORAL
  Filled 2010-12-09: qty 1

## 2010-12-09 MED ORDER — HYDROCODONE-ACETAMINOPHEN 7.5-325 MG PO TABS: 1.0000 | ORAL_TABLET | ORAL | Status: AC | PRN

## 2010-12-09 MED ORDER — IOHEXOL 300 MG/ML  SOLN
100.0000 mL | Freq: Once | INTRAMUSCULAR | Status: AC | PRN
  Administered 2010-12-09: 100 mL via INTRAVENOUS

## 2010-12-09 MED ORDER — ONDANSETRON HCL 4 MG PO TABS
4.0000 mg | ORAL_TABLET | Freq: Once | ORAL | Status: AC
  Administered 2010-12-09: 4 mg via ORAL
  Filled 2010-12-09: qty 1

## 2010-12-09 MED ORDER — METHOCARBAMOL 500 MG PO TABS: ORAL_TABLET | ORAL | Status: DC

## 2010-12-09 NOTE — ED Notes (Signed)
Complain of pain in right knee and bilateral rib pain

## 2010-12-09 NOTE — ED Provider Notes (Signed)
History     CSN: [id number] Arrival date & time: 12/09/2010  2:02 PM  Chief Complaint  Patient presents with  . Fall   Patient is a 77 y.o. female presenting with fall. The history is provided by the patient.  Fall The accident occurred 1 to 2 hours ago. The fall occurred while walking. She landed on a hard floor. The point of impact was the left elbow and right knee. The pain is present in the neck and right knee. The pain is at a severity of 7/10. The pain is moderate. She was ambulatory at the scene. There was no drug use involved in the accident. Associated symptoms include abdominal pain. Pertinent negatives include no visual change, no fever, no bowel incontinence, no nausea, no vomiting, no hematuria, no hearing loss and no loss of consciousness. Exacerbated by: nothing. She has tried nothing for the symptoms.    History reviewed. No pertinent past medical history.  Past Surgical History  Procedure Date  . Abdominal hysterectomy   . Cholecystectomy   . Thyroidectomy, partial     No family history on file.  History  Substance Use Topics  . Smoking status: Never Smoker   . Smokeless tobacco: Not on file  . Alcohol Use: No    OB History    Grav Para Term Preterm Abortions TAB SAB Ect Mult Living                  Review of Systems  Constitutional: Negative for fever and activity change.       All ROS Neg except as noted in HPI  HENT: Negative for nosebleeds and neck pain.   Eyes: Negative for photophobia and discharge.  Respiratory: Negative for cough, shortness of breath and wheezing.   Cardiovascular: Negative for chest pain and palpitations.  Gastrointestinal: Positive for abdominal pain. Negative for nausea, vomiting, blood in stool and bowel incontinence.  Genitourinary: Negative for dysuria, frequency and hematuria.  Musculoskeletal: Negative for back pain and arthralgias.  Skin: Negative.   Neurological: Negative for dizziness, seizures, loss of consciousness  and speech difficulty.  Psychiatric/Behavioral: Negative for hallucinations and confusion.    Physical Exam  BP 126/62  Pulse 78  Temp(Src) 98.1 F (36.7 C) (Oral)  Resp 20  Ht 5' 8"  (1.727 m)  Wt 155 lb (70.308 kg)  BMI 23.57 kg/m2  SpO2 98%  Physical Exam  Nursing note and vitals reviewed. Constitutional: She is oriented to person, place, and time. She appears well-developed and well-nourished.  Non-toxic appearance.  HENT:  Head: Normocephalic.  Right Ear: Tympanic membrane and external ear normal.  Left Ear: Tympanic membrane and external ear normal.  Nose: Nose normal.  Mouth/Throat: Oropharynx is clear and moist.  Eyes: EOM and lids are normal. Pupils are equal, round, and reactive to light.  Neck: Carotid bruit is not present.       Soreness at the base of the neck to palpation.  Cardiovascular: Normal rate, regular rhythm, normal heart sounds, intact distal pulses and normal pulses.   Pulmonary/Chest: Breath sounds normal. No respiratory distress.       bilat rib area pain. No deformity.  Abdominal: Soft. Bowel sounds are normal. There is tenderness. There is guarding.       Epigastric and RUQ pain to palpation.  Musculoskeletal: Normal range of motion.       Abrasion of the left elbow. FROM. Mild to mod pain of the right knee. No deformity.  Lymphadenopathy:  Head (right side): No submandibular adenopathy present.       Head (left side): No submandibular adenopathy present.    She has no cervical adenopathy.  Neurological: She is alert and oriented to person, place, and time. She has normal strength. No cranial nerve deficit or sensory deficit.  Skin: Skin is warm and dry.  Psychiatric: She has a normal mood and affect. Her speech is normal.    ED Course  Procedures  MDM xrays an labs reviewed. No fx or dislocation. Pt can be treated at home with medications.      Lenox Ahr, [place] 12/09/10 1819

## 2010-12-09 NOTE — ED Provider Notes (Signed)
Medical screening examination/treatment/procedure(s) were performed by non-physician practitioner and as supervising physician I was immediately available for consultation/collaboration.   Jasper Riling. Alvino Chapel, MD 12/09/10 [phone]

## 2010-12-09 NOTE — ED Notes (Signed)
Pt reports falling today face first out side.  Pt reports catching herself with her left elbow and rt knee.  Pt has some bruising and minor scratches to her extremities but c/o pain bilaterally to her ribs.  Pt reports increased pain upon taking a deep breath.

## 2010-12-09 NOTE — ED Notes (Signed)
Waiting to be reeval and disposition

## 2011-04-02 ENCOUNTER — Encounter: Payer: Self-pay | Admitting: Cardiology

## 2011-04-29 DIAGNOSIS — R000 Tachycardia, unspecified: Secondary | ICD-10-CM | POA: Diagnosis not present

## 2011-04-29 DIAGNOSIS — N390 Urinary tract infection, site not specified: Secondary | ICD-10-CM | POA: Diagnosis not present

## 2011-05-06 ENCOUNTER — Encounter: Payer: Self-pay | Admitting: Gastroenterology

## 2011-05-06 ENCOUNTER — Ambulatory Visit (INDEPENDENT_AMBULATORY_CARE_PROVIDER_SITE_OTHER): Payer: Medicare Other | Admitting: Gastroenterology

## 2011-05-06 DIAGNOSIS — K5289 Other specified noninfective gastroenteritis and colitis: Secondary | ICD-10-CM

## 2011-05-06 DIAGNOSIS — Z800 Family history of malignant neoplasm of digestive organs: Secondary | ICD-10-CM | POA: Diagnosis not present

## 2011-05-06 NOTE — Patient Instructions (Signed)
Call back as needed

## 2011-05-06 NOTE — Assessment & Plan Note (Signed)
Plan followup colonoscopy in 3 years

## 2011-05-06 NOTE — Assessment & Plan Note (Addendum)
Colitis remains in remission on maintenance apriso  Plan to continue with the same.

## 2011-05-06 NOTE — Progress Notes (Signed)
History of Present Illness:  Mrs. Willenbring has returned for followup of her ulcerative colitis. She has left-sided colitis that was diagnosed about 5 years ago. Family history is pertinent for colon cancer. Last colonoscopy 2 years ago demonstrated diverticulosis only. She remains on Apriso. She has no GI complaints except for occasional loose stools.    Review of Systems: Pertinent positive and negative review of systems were noted in the above HPI section. All other review of systems were otherwise negative.    Current Medications, Allergies, Past Medical History, Past Surgical History, Family History and Social History were reviewed in [facility] record  Vital signs were reviewed in today's medical record. Physical Exam: General: Well developed , well nourished, no acute distress Head: Normocephalic and atraumatic Eyes:  sclerae anicteric, EOMI Ears: Normal auditory acuity Mouth: No deformity or lesions Lungs: Clear throughout to auscultation Heart: Regular rate and rhythm; no murmurs, rubs or bruits Abdomen: Soft, non tender and non distended. No masses, hepatosplenomegaly or hernias noted. Normal Bowel sounds Rectal:deferred Musculoskeletal: Symmetrical with no gross deformities  Pulses:  Normal pulses noted Extremities: No clubbing, cyanosis, edema or deformities noted Neurological: Alert oriented x 4, grossly nonfocal Psychological:  Alert and cooperative. Normal mood and affect

## 2011-06-11 DIAGNOSIS — N390 Urinary tract infection, site not specified: Secondary | ICD-10-CM | POA: Diagnosis not present

## 2011-06-25 DIAGNOSIS — Z1231 Encounter for screening mammogram for malignant neoplasm of breast: Secondary | ICD-10-CM | POA: Diagnosis not present

## 2011-06-30 DIAGNOSIS — M2010 Hallux valgus (acquired), unspecified foot: Secondary | ICD-10-CM | POA: Diagnosis not present

## 2011-06-30 DIAGNOSIS — M79609 Pain in unspecified limb: Secondary | ICD-10-CM | POA: Diagnosis not present

## 2011-07-10 DIAGNOSIS — N3020 Other chronic cystitis without hematuria: Secondary | ICD-10-CM | POA: Diagnosis not present

## 2011-07-10 DIAGNOSIS — N3000 Acute cystitis without hematuria: Secondary | ICD-10-CM | POA: Diagnosis not present

## 2011-07-10 DIAGNOSIS — N952 Postmenopausal atrophic vaginitis: Secondary | ICD-10-CM | POA: Diagnosis not present

## 2011-07-14 DIAGNOSIS — N281 Cyst of kidney, acquired: Secondary | ICD-10-CM | POA: Diagnosis not present

## 2011-07-14 DIAGNOSIS — M1610 Unilateral primary osteoarthritis, unspecified hip: Secondary | ICD-10-CM | POA: Diagnosis not present

## 2011-07-14 DIAGNOSIS — N3020 Other chronic cystitis without hematuria: Secondary | ICD-10-CM | POA: Diagnosis not present

## 2011-07-14 DIAGNOSIS — N3000 Acute cystitis without hematuria: Secondary | ICD-10-CM | POA: Diagnosis not present

## 2011-07-24 DIAGNOSIS — N952 Postmenopausal atrophic vaginitis: Secondary | ICD-10-CM | POA: Diagnosis not present

## 2011-07-24 DIAGNOSIS — N3020 Other chronic cystitis without hematuria: Secondary | ICD-10-CM | POA: Diagnosis not present

## 2011-08-03 DIAGNOSIS — Z79899 Other long term (current) drug therapy: Secondary | ICD-10-CM | POA: Diagnosis not present

## 2011-08-03 DIAGNOSIS — E7800 Pure hypercholesterolemia, unspecified: Secondary | ICD-10-CM | POA: Diagnosis not present

## 2011-08-03 DIAGNOSIS — E039 Hypothyroidism, unspecified: Secondary | ICD-10-CM | POA: Diagnosis not present

## 2011-08-25 DIAGNOSIS — N3000 Acute cystitis without hematuria: Secondary | ICD-10-CM | POA: Diagnosis not present

## 2011-08-31 ENCOUNTER — Other Ambulatory Visit: Payer: Self-pay

## 2011-08-31 ENCOUNTER — Encounter (HOSPITAL_COMMUNITY): Payer: Self-pay | Admitting: *Deleted

## 2011-08-31 ENCOUNTER — Emergency Department (HOSPITAL_COMMUNITY): Payer: Medicare Other

## 2011-08-31 ENCOUNTER — Emergency Department (HOSPITAL_COMMUNITY)
Admission: EM | Admit: 2011-08-31 | Discharge: 2011-09-01 | Disposition: A | Discharge status: Home / Self Care | Payer: Medicare Other | Attending: Emergency Medicine | Admitting: Emergency Medicine

## 2011-08-31 DIAGNOSIS — R079 Chest pain, unspecified: Secondary | ICD-10-CM | POA: Diagnosis not present

## 2011-08-31 DIAGNOSIS — R0602 Shortness of breath: Secondary | ICD-10-CM | POA: Diagnosis not present

## 2011-08-31 DIAGNOSIS — R0789 Other chest pain: Secondary | ICD-10-CM

## 2011-08-31 DIAGNOSIS — R071 Chest pain on breathing: Secondary | ICD-10-CM | POA: Diagnosis not present

## 2011-08-31 DIAGNOSIS — R110 Nausea: Secondary | ICD-10-CM | POA: Insufficient documentation

## 2011-08-31 DIAGNOSIS — J984 Other disorders of lung: Secondary | ICD-10-CM | POA: Diagnosis not present

## 2011-08-31 DIAGNOSIS — N390 Urinary tract infection, site not specified: Secondary | ICD-10-CM | POA: Insufficient documentation

## 2011-08-31 DIAGNOSIS — E039 Hypothyroidism, unspecified: Secondary | ICD-10-CM | POA: Insufficient documentation

## 2011-08-31 DIAGNOSIS — Z79899 Other long term (current) drug therapy: Secondary | ICD-10-CM | POA: Insufficient documentation

## 2011-08-31 DIAGNOSIS — R918 Other nonspecific abnormal finding of lung field: Secondary | ICD-10-CM | POA: Diagnosis not present

## 2011-08-31 LAB — DIFFERENTIAL
Basophils Absolute: 0 10*3/uL (ref 0.0–0.1)
Basophils Relative: 1 % (ref 0–1)
Eosinophils Absolute: 0.2 10*3/uL (ref 0.0–0.7)
Eosinophils Relative: 3 % (ref 0–5)
Lymphocytes Relative: 35 % (ref 12–46)
Lymphs Abs: 2.2 10*3/uL (ref 0.7–4.0)
Monocytes Absolute: 0.6 10*3/uL (ref 0.1–1.0)
Monocytes Relative: 9 % (ref 3–12)
Neutro Abs: 3.4 10*3/uL (ref 1.7–7.7)
Neutrophils Relative %: 53 % (ref 43–77)

## 2011-08-31 LAB — CBC
HCT: 34.5 % — ABNORMAL LOW (ref 36.0–46.0)
Hemoglobin: 11.5 g/dL — ABNORMAL LOW (ref 12.0–15.0)
MCH: 31.2 pg (ref 26.0–34.0)
MCHC: 33.3 g/dL (ref 30.0–36.0)
MCV: 93.5 fL (ref 78.0–100.0)
Platelets: 232 10*3/uL (ref 150–400)
RBC: 3.69 MIL/uL — ABNORMAL LOW (ref 3.87–5.11)
RDW: 13.4 % (ref 11.5–15.5)
WBC: 6.5 10*3/uL (ref 4.0–10.5)

## 2011-08-31 MED ORDER — PANTOPRAZOLE SODIUM 40 MG IV SOLR
40.0000 mg | Freq: Once | INTRAVENOUS | Status: AC
  Administered 2011-09-01: 40 mg via INTRAVENOUS
  Filled 2011-08-31: qty 40

## 2011-08-31 MED ORDER — HYDROCODONE-ACETAMINOPHEN 5-325 MG PO TABS
1.0000 | ORAL_TABLET | Freq: Once | ORAL | Status: AC
  Administered 2011-08-31: 1 via ORAL
  Filled 2011-08-31: qty 1

## 2011-08-31 MED ORDER — SODIUM CHLORIDE 0.9 % IV SOLN
Freq: Once | INTRAVENOUS | Status: AC
  Administered 2011-09-01: via INTRAVENOUS

## 2011-08-31 MED ORDER — IBUPROFEN 800 MG PO TABS
800.0000 mg | ORAL_TABLET | Freq: Once | ORAL | Status: AC
  Administered 2011-08-31: 800 mg via ORAL
  Filled 2011-08-31: qty 1

## 2011-08-31 MED ORDER — FAMOTIDINE IN NACL 20-0.9 MG/50ML-% IV SOLN
20.0000 mg | Freq: Once | INTRAVENOUS | Status: AC
  Administered 2011-09-01: 20 mg via INTRAVENOUS
  Filled 2011-08-31: qty 50

## 2011-08-31 NOTE — ED Notes (Addendum)
Chest pain for 1 week ,  Being treated for UTI.  Pt thought was due to med for uti.    CP is intermittent. Lasting  few minutes.  Pain is sharp at times.  Nausea at times.skin warm and dry, color wnl

## 2011-08-31 NOTE — ED Provider Notes (Signed)
History  This chart was scribed for [employer]. Olin Hauser, MD by Cathe Mons. The patient was seen in room APA19/APA19. Patient's care was started at 2156.  CSN: [id number]  Arrival date & time 08/31/11  2156   First MD Initiated Contact with Patient 08/31/11 2311      Chief Complaint  Patient presents with  . Chest Pain    (Consider location/radiation/quality/duration/timing/severity/associated sxs/prior treatment) HPI  Cindy [name] is a 78 y.o. female with a h/o diarrhea, GERD, diverticulitis, and hyperlipidemia who presents to the Emergency Department complaining of 2 days of sudden onset, unchanged, waxing and waning chest pain localized to the left chest and described as "like having a heart attack" with associated SOB and nausea. Pt states that symptoms are worse with exertion and movement. Pt is being treated for a chronic UTI (more than 6 months) by a urologist in [place]. He prescribed Levaquin and Trimethoprim, which she began taking last week. After the first few doses she felt nauseated, but continued taking the medication. The chest pain began 2 days ago, and gets worse when taking the antibiotics. Pt denies vomiting, chills, diarrhea, and fever. Pt denies smoking and alcohol use.   Past Medical History  Diagnosis Date  . Osteoporosis, unspecified   . Edema   . Other and unspecified hyperlipidemia   . Unspecified hypothyroidism   . Diarrhea   . Other and unspecified noninfectious gastroenteritis and colitis   . Family history of malignant neoplasm of gastrointestinal tract   . Flatulence, eructation, and gas pain   . GERD (gastroesophageal reflux disease)   . Stricture and stenosis of esophagus   . Esophagitis, unspecified   . Diverticulosis of colon (without mention of hemorrhage)   . Left sided ulcerative colitis     Past Surgical History  Procedure Date  . Abdominal hysterectomy   . Cholecystectomy   . Thyroidectomy, partial     Family History  Problem  Relation Age of Onset  . Colon cancer Mother   . Stomach cancer Sister   . Diabetes Brother   . Coronary artery disease Other   . Diabetes Other     Niece  . Diabetes Son     History  Substance Use Topics  . Smoking status: Never Smoker   . Smokeless tobacco: Not on file  . Alcohol Use: No    OB History    Grav Para Term Preterm Abortions TAB SAB Ect Mult Living                  Review of Systems  Constitutional: Negative for fever and chills.  HENT: Negative for ear pain and neck pain.   Respiratory: Positive for shortness of breath. Negative for cough.   Cardiovascular: Positive for chest pain.  Gastrointestinal: Positive for nausea. Negative for vomiting and diarrhea.  Musculoskeletal: Negative for back pain.  Skin: Negative for rash.  Neurological: Negative for dizziness, light-headedness and headaches.  All other systems reviewed and are negative.    Allergies  Codeine and Sulfonamide derivatives  Home Medications   Current Outpatient Rx  Name Route Sig Dispense Refill  . ACETAMINOPHEN 500 MG PO TABS Oral Take 500 mg by mouth every 6 (six) hours as needed. For pain     . ASPIRIN EC 81 MG PO TBEC Oral Take 81 mg by mouth every evening.     Marland Kitchen CALCIUM 1200 PO Oral Take 1 tablet by mouth daily.      Marland Kitchen VITAMIN D 1000 UNITS PO  TABS Oral Take 1,000 Units by mouth daily.      Marland Kitchen EZETIMIBE 10 MG PO TABS Oral Take 10 mg by mouth daily.      Marland Kitchen LEVOFLOXACIN 500 MG PO TABS Oral Take 500 mg by mouth daily.    Marland Kitchen LEVOTHYROXINE SODIUM 112 MCG PO TABS Oral Take 112 mcg by mouth daily.      Marland Kitchen MESALAMINE ER 0.375 G PO CP24 Oral Take 375 mg by mouth at bedtime. 3 BY MOUTH ONCE DAILY    . OMEPRAZOLE 20 MG PO CPDR Oral Take 20 mg by mouth daily.      Marland Kitchen PHENAZOPYRIDINE HCL 200 MG PO TABS Oral Take 200 mg by mouth 3 (three) times daily as needed. FOR PAIN    . PROPYLENE GLYCOL 0.6 % OP SOLN Ophthalmic Apply 1 drop to eye daily.    Marland Kitchen TRIMETHOPRIM 100 MG PO TABS Oral Take 100 mg by  mouth every morning.      Triage Vitals: BP 135/65  Pulse 96  Temp(Src) 98.1 F (36.7 C) (Oral)  Resp 18  Ht 5' 8"  (1.727 m)  Wt 155 lb (70.308 kg)  BMI 23.57 kg/m2  SpO2 92%  Physical Exam  Nursing note and vitals reviewed. Constitutional: She is oriented to person, place, and time. She appears well-developed and well-nourished.  HENT:  Head: Normocephalic and atraumatic.  Eyes: Conjunctivae are normal. No scleral icterus.  Neck: Normal range of motion. Neck supple.  Cardiovascular: Normal rate, regular rhythm and normal heart sounds.   No murmur heard. Pulmonary/Chest: Effort normal. No respiratory distress. She exhibits tenderness (tenderness to entire anterior chest with palpation).  Abdominal: Soft. She exhibits no distension. There is no tenderness. There is no rebound.  Musculoskeletal: Normal range of motion. She exhibits no edema.  Neurological: She is alert and oriented to person, place, and time.  Skin: Skin is warm and dry.  Psychiatric: She has a normal mood and affect. Her behavior is normal.    ED Course  Procedures (including critical care time)  DIAGNOSTIC STUDIES: Oxygen Saturation is 95% on room air, adequate by my interpretation.    COORDINATION OF CARE: 11:38AM - discussed labs and scans with pt and husband. They agree with plan.  Results for orders placed during the hospital encounter of 08/31/11  CBC      Component Value Range   WBC 6.5  4.0 - 10.5 (K/uL)   RBC 3.69 (*) 3.87 - 5.11 (MIL/uL)   Hemoglobin 11.5 (*) 12.0 - 15.0 (g/dL)   HCT 34.5 (*) 36.0 - 46.0 (%)   MCV 93.5  78.0 - 100.0 (fL)   MCH 31.2  26.0 - 34.0 (pg)   MCHC 33.3  30.0 - 36.0 (g/dL)   RDW 13.4  11.5 - 15.5 (%)   Platelets 232  150 - 400 (K/uL)  DIFFERENTIAL      Component Value Range   Neutrophils Relative 53  43 - 77 (%)   Neutro Abs 3.4  1.7 - 7.7 (K/uL)   Lymphocytes Relative 35  12 - 46 (%)   Lymphs Abs 2.2  0.7 - 4.0 (K/uL)   Monocytes Relative 9  3 - 12 (%)    Monocytes Absolute 0.6  0.1 - 1.0 (K/uL)   Eosinophils Relative 3  0 - 5 (%)   Eosinophils Absolute 0.2  0.0 - 0.7 (K/uL)   Basophils Relative 1  0 - 1 (%)   Basophils Absolute 0.0  0.0 - 0.1 (K/uL)  BASIC METABOLIC PANEL  Component Value Range   Sodium 138  135 - 145 (mEq/L)   Potassium 3.8  3.5 - 5.1 (mEq/L)   Chloride 103  96 - 112 (mEq/L)   CO2 26  19 - 32 (mEq/L)   Glucose, Bld 104 (*) 70 - 99 (mg/dL)   BUN 11  6 - 23 (mg/dL)   Creatinine, Ser 0.67  0.50 - 1.10 (mg/dL)   Calcium 9.4  8.4 - 10.5 (mg/dL)   GFR calc non Af Amer 83 (*) >90 (mL/min)   GFR calc Af Amer >90  >90 (mL/min)  TROPONIN I      Component Value Range   Troponin I <0.30  <0.30 (ng/mL)  URINALYSIS, ROUTINE W REFLEX MICROSCOPIC      Component Value Range   Color, Urine YELLOW  YELLOW    APPearance CLEAR  CLEAR    Specific Gravity, Urine <1.005 (*) 1.005 - 1.030    pH 7.0  5.0 - 8.0    Glucose, UA NEGATIVE  NEGATIVE (mg/dL)   Hgb urine dipstick NEGATIVE  NEGATIVE    Bilirubin Urine NEGATIVE  NEGATIVE    Ketones, ur NEGATIVE  NEGATIVE (mg/dL)   Protein, ur NEGATIVE  NEGATIVE (mg/dL)   Urobilinogen, UA 1.0  0.0 - 1.0 (mg/dL)   Nitrite POSITIVE (*) NEGATIVE    Leukocytes, UA NEGATIVE  NEGATIVE   URINE MICROSCOPIC-ADD ON      Component Value Range   Squamous Epithelial / LPF MANY (*) RARE    WBC, UA 0-2  <3 (WBC/hpf)   Bacteria, UA FEW (*) RARE    Dg Chest 2 View  09/01/2011  *RADIOLOGY REPORT*  Clinical Data: Left upper chest pain for 6 days.  CHEST - 2 VIEW  Comparison: CT chest 12/09/2010  Findings: Normal heart size and pulmonary vascularity.  Calcified and tortuous aorta.  Emphysematous changes in the lungs with diffuse interstitial changes consistent with fibrosis.  Scarring in the apices.  Normal heart size.  There is some prominence of central pulmonary vascularity suggesting arterial hypertension.  No focal airspace consolidation.  No blunting of costophrenic angles. No pneumothorax.  Calcified  and tortuous aorta.  IMPRESSION: Emphysematous changes and interstitial fibrosis in the lungs with apical scarring.  Suggestion of pulmonary arterial hypertension.  Original Report Authenticated By: Neale Burly, M.D.      Date: 09/01/2011  2214  Rate: 100  Rhythm: normal sinus rhythm  QRS Axis: normal  Intervals: normal  ST/T Wave abnormalities: normal  Conduction Disutrbances: none  Narrative Interpretation: unremarkable      MDM  Patient under treatment for chronic urinary tract infection. She is here with episode of chest pain on Saturday and again tonight. Pain is reproducible with palpation. Labs are unremarkable. UTI is resolving on antibiotics. Troponin is negative, EKG is normal, chest x-ray without any acute findings. Patient was given analgesics with improvement in the discomfort. Reviewed results with both the patient and her husband.Pt stable in ED with no significant deterioration in condition.The patient appears reasonably screened and/or stabilized for discharge and I doubt any other medical condition or other [facility] requiring further screening, evaluation, or treatment in the ED at this time prior to discharge.  I personally performed the services described in this documentation, which was scribed in my presence. The recorded information has been reviewed and considered.  MDM Reviewed: nursing note and vitals Interpretation: labs, ECG and x-ray            Gypsy Balsam. Olin Hauser, MD 09/01/11 2376

## 2011-09-01 LAB — URINALYSIS, ROUTINE W REFLEX MICROSCOPIC
Bilirubin Urine: NEGATIVE
Glucose, UA: NEGATIVE mg/dL
Hgb urine dipstick: NEGATIVE
Ketones, ur: NEGATIVE mg/dL
Leukocytes, UA: NEGATIVE
Nitrite: POSITIVE — AB
Protein, ur: NEGATIVE mg/dL
Specific Gravity, Urine: <1.005 — ABNORMAL LOW (ref 1.005–1.030)
Urobilinogen, UA: 1 mg/dL (ref 0.0–1.0)
pH: 7 (ref 5.0–8.0)

## 2011-09-01 LAB — BASIC METABOLIC PANEL
BUN: 11 mg/dL (ref 6–23)
CO2: 26 mEq/L (ref 19–32)
Calcium: 9.4 mg/dL (ref 8.4–10.5)
Chloride: 103 mEq/L (ref 96–112)
Creatinine, Ser: 0.67 mg/dL (ref 0.50–1.10)
GFR calc Af Amer: >90 mL/min (ref >90)
GFR calc non Af Amer: 83 mL/min — ABNORMAL LOW (ref >90)
Glucose, Bld: 104 mg/dL — ABNORMAL HIGH (ref 70–99)
Potassium: 3.8 mEq/L (ref 3.5–5.1)
Sodium: 138 mEq/L (ref 135–145)

## 2011-09-01 LAB — URINE MICROSCOPIC-ADD ON

## 2011-09-01 LAB — TROPONIN I: Troponin I: <0.3 ng/mL (ref <0.30)

## 2011-09-01 MED ORDER — HYDROCODONE-ACETAMINOPHEN 5-325 MG PO TABS: ORAL_TABLET | ORAL | Status: DC

## 2011-09-01 NOTE — Discharge Instructions (Signed)
Your blood work here tonight was normal, including her heart numbers. Your urine is improving. Finish all of your antibiotics. Followup with Dr. Gaynelle Arabian or with Dr. Luan Pulling. Use the pain medicine as needed for your chest discomfort.   Chest Wall Pain Chest wall pain is pain in or around the bones and muscles of your chest. It may take up to 6 weeks to get better. It may take longer if you must stay physically active in your work and activities.  CAUSES  Chest wall pain may happen on its own. However, it may be caused by:  A viral illness like the flu.   Injury.   Coughing.   Exercise.   Arthritis.   Fibromyalgia.   Shingles.  HOME CARE INSTRUCTIONS   Avoid overtiring physical activity. Try not to strain or perform activities that cause pain. This includes any activities using your chest or your abdominal and side muscles, especially if heavy weights are used.   Put ice on the sore area.   Put ice in a plastic bag.   Place a towel between your skin and the bag.   Leave the ice on for 15 to 20 minutes per hour while awake for the first 2 days.   Only take over-the-counter or prescription medicines for pain, discomfort, or fever as directed by your caregiver.  SEEK IMMEDIATE MEDICAL CARE IF:   Your pain increases, or you are very uncomfortable.   You have a fever.   Your chest pain becomes worse.   You have new, unexplained symptoms.   You have nausea or vomiting.   You feel sweaty or lightheaded.   You have a cough with phlegm (sputum), or you cough up blood.  MAKE SURE YOU:   Understand these instructions.   Will watch your condition.   Will get help right away if you are not doing well or get worse.  Document Released: 04/06/2005 Document Revised: 03/26/2011 Document Reviewed: 12/01/2010 [facility] Patient Information 2012 [place].

## 2011-09-17 ENCOUNTER — Encounter (INDEPENDENT_AMBULATORY_CARE_PROVIDER_SITE_OTHER): Payer: Self-pay

## 2011-09-21 DIAGNOSIS — D235 Other benign neoplasm of skin of trunk: Secondary | ICD-10-CM | POA: Diagnosis not present

## 2011-09-21 DIAGNOSIS — L820 Inflamed seborrheic keratosis: Secondary | ICD-10-CM | POA: Diagnosis not present

## 2011-10-07 DIAGNOSIS — N3020 Other chronic cystitis without hematuria: Secondary | ICD-10-CM | POA: Diagnosis not present

## 2011-10-07 DIAGNOSIS — N952 Postmenopausal atrophic vaginitis: Secondary | ICD-10-CM | POA: Diagnosis not present

## 2011-10-07 DIAGNOSIS — N3000 Acute cystitis without hematuria: Secondary | ICD-10-CM | POA: Diagnosis not present

## 2011-10-27 DIAGNOSIS — N3020 Other chronic cystitis without hematuria: Secondary | ICD-10-CM | POA: Diagnosis not present

## 2011-10-27 DIAGNOSIS — N952 Postmenopausal atrophic vaginitis: Secondary | ICD-10-CM | POA: Diagnosis not present

## 2011-11-30 ENCOUNTER — Telehealth: Payer: Self-pay | Admitting: *Deleted

## 2011-11-30 MED ORDER — MESALAMINE ER 0.375 G PO CP24: 375.0000 mg | ORAL_CAPSULE | Freq: Every day | ORAL | Status: DC

## 2011-12-30 NOTE — Telephone Encounter (Signed)
Error

## 2012-01-12 ENCOUNTER — Telehealth: Payer: Self-pay

## 2012-01-12 MED ORDER — MESALAMINE ER 0.375 G PO CP24: ORAL_CAPSULE | ORAL | Status: DC

## 2012-01-12 MED ORDER — OMEPRAZOLE 20 MG PO CPDR: 20.0000 mg | DELAYED_RELEASE_CAPSULE | Freq: Every day | ORAL | Status: DC

## 2012-01-12 NOTE — Telephone Encounter (Signed)
Pt just "dropped by" to see if we have any samples of Apriso and Prilosec. Samples given to pt.

## 2012-01-15 DIAGNOSIS — Z23 Encounter for immunization: Secondary | ICD-10-CM | POA: Diagnosis not present

## 2012-02-02 ENCOUNTER — Telehealth: Payer: Self-pay | Admitting: Gastroenterology

## 2012-02-02 NOTE — Telephone Encounter (Signed)
Patient will be here tomorrow to pick up samples of Apriso

## 2012-02-03 DIAGNOSIS — N3020 Other chronic cystitis without hematuria: Secondary | ICD-10-CM | POA: Diagnosis not present

## 2012-02-03 DIAGNOSIS — N952 Postmenopausal atrophic vaginitis: Secondary | ICD-10-CM | POA: Diagnosis not present

## 2012-02-08 DIAGNOSIS — J440 Chronic obstructive pulmonary disease with acute lower respiratory infection: Secondary | ICD-10-CM | POA: Diagnosis not present

## 2012-03-29 ENCOUNTER — Telehealth: Payer: Self-pay | Admitting: Gastroenterology

## 2012-03-29 NOTE — Telephone Encounter (Signed)
PATIENT WILL COME IN TO THE OFFICE TOMORROW TO PICK UP SAMPLES

## 2012-04-29 ENCOUNTER — Telehealth: Payer: Self-pay | Admitting: Gastroenterology

## 2012-04-29 NOTE — Telephone Encounter (Signed)
PATIENT TO COME PICK UP SAMPLES TODAY

## 2012-05-27 DIAGNOSIS — N3941 Urge incontinence: Secondary | ICD-10-CM | POA: Diagnosis not present

## 2012-05-27 DIAGNOSIS — N3020 Other chronic cystitis without hematuria: Secondary | ICD-10-CM | POA: Diagnosis not present

## 2012-05-27 DIAGNOSIS — N952 Postmenopausal atrophic vaginitis: Secondary | ICD-10-CM | POA: Diagnosis not present

## 2012-07-07 DIAGNOSIS — M21619 Bunion of unspecified foot: Secondary | ICD-10-CM | POA: Diagnosis not present

## 2012-07-13 ENCOUNTER — Telehealth: Payer: Self-pay | Admitting: *Deleted

## 2012-07-13 NOTE — Telephone Encounter (Signed)
Has lab appt on friday

## 2012-07-14 ENCOUNTER — Encounter: Payer: Self-pay | Admitting: *Deleted

## 2012-07-15 ENCOUNTER — Other Ambulatory Visit: Payer: Medicare Other

## 2012-07-15 DIAGNOSIS — E785 Hyperlipidemia, unspecified: Secondary | ICD-10-CM

## 2012-07-15 LAB — COMPREHENSIVE METABOLIC PANEL
ALT: 11 U/L (ref 0–35)
AST: 18 U/L (ref 0–37)
Albumin: 3.9 g/dL (ref 3.5–5.2)
Alkaline Phosphatase: 63 U/L (ref 39–117)
BUN: 8 mg/dL (ref 6–23)
CO2: 26 mEq/L (ref 19–32)
Calcium: 9.5 mg/dL (ref 8.4–10.5)
Chloride: 101 mEq/L (ref 96–112)
Creat: 0.61 mg/dL (ref 0.50–1.10)
Glucose, Bld: 87 mg/dL (ref 70–99)
Potassium: 4.7 mEq/L (ref 3.5–5.3)
Sodium: 138 mEq/L (ref 135–145)
Total Bilirubin: 0.6 mg/dL (ref 0.3–1.2)
Total Protein: 6.4 g/dL (ref 6.0–8.3)

## 2012-07-15 LAB — CBC
HCT: 39.1 % (ref 36.0–46.0)
Hemoglobin: 13.1 g/dL (ref 12.0–15.0)
MCH: 30.3 pg (ref 26.0–34.0)
MCHC: 33.5 g/dL (ref 30.0–36.0)
MCV: 90.3 fL (ref 78.0–100.0)
Platelets: 273 10*3/uL (ref 150–400)
RBC: 4.33 MIL/uL (ref 3.87–5.11)
RDW: 13.4 % (ref 11.5–15.5)
WBC: 5.9 10*3/uL (ref 4.0–10.5)

## 2012-07-15 LAB — LIPID PANEL
Cholesterol: 221 mg/dL — ABNORMAL HIGH (ref 0–200)
HDL: 50 mg/dL (ref >39)
LDL Cholesterol: 154 mg/dL — ABNORMAL HIGH (ref 0–99)
Total CHOL/HDL Ratio: 4.4 Ratio
Triglycerides: 86 mg/dL (ref <150)
VLDL: 17 mg/dL (ref 0–40)

## 2012-07-15 LAB — TSH: TSH: 3.024 u[IU]/mL (ref 0.350–4.500)

## 2012-08-09 ENCOUNTER — Telehealth: Payer: Self-pay | Admitting: Gastroenterology

## 2012-08-09 NOTE — Telephone Encounter (Signed)
PT ON THE WAY TO PICK UP SAMPLES

## 2012-08-20 ENCOUNTER — Other Ambulatory Visit: Payer: Self-pay | Admitting: Obstetrics & Gynecology

## 2012-09-07 DIAGNOSIS — N952 Postmenopausal atrophic vaginitis: Secondary | ICD-10-CM | POA: Diagnosis not present

## 2012-09-07 DIAGNOSIS — N3941 Urge incontinence: Secondary | ICD-10-CM | POA: Diagnosis not present

## 2012-09-07 DIAGNOSIS — N3020 Other chronic cystitis without hematuria: Secondary | ICD-10-CM | POA: Diagnosis not present

## 2012-09-13 DIAGNOSIS — Z1231 Encounter for screening mammogram for malignant neoplasm of breast: Secondary | ICD-10-CM | POA: Diagnosis not present

## 2012-10-11 DIAGNOSIS — M2010 Hallux valgus (acquired), unspecified foot: Secondary | ICD-10-CM | POA: Diagnosis not present

## 2012-10-11 DIAGNOSIS — G8918 Other acute postprocedural pain: Secondary | ICD-10-CM | POA: Diagnosis not present

## 2012-10-11 DIAGNOSIS — M21619 Bunion of unspecified foot: Secondary | ICD-10-CM | POA: Diagnosis not present

## 2012-10-17 ENCOUNTER — Encounter (INDEPENDENT_AMBULATORY_CARE_PROVIDER_SITE_OTHER): Payer: Self-pay

## 2012-10-22 ENCOUNTER — Other Ambulatory Visit: Payer: Self-pay | Admitting: Obstetrics & Gynecology

## 2012-11-08 ENCOUNTER — Telehealth: Payer: Self-pay | Admitting: Gastroenterology

## 2012-11-11 NOTE — Telephone Encounter (Signed)
Patient to pick up her samples next week

## 2012-12-06 ENCOUNTER — Ambulatory Visit (INDEPENDENT_AMBULATORY_CARE_PROVIDER_SITE_OTHER): Payer: Medicare Other | Admitting: Obstetrics & Gynecology

## 2012-12-06 ENCOUNTER — Encounter: Payer: Self-pay | Admitting: Obstetrics & Gynecology

## 2012-12-06 VITALS — BP 110/80 | Ht 68.0 in | Wt 158.0 lb

## 2012-12-06 DIAGNOSIS — N950 Postmenopausal bleeding: Secondary | ICD-10-CM

## 2012-12-06 MED ORDER — LEVOTHYROXINE SODIUM 125 MCG PO TABS: 125.0000 ug | ORAL_TABLET | Freq: Every day | ORAL | Status: DC

## 2012-12-06 NOTE — Progress Notes (Signed)
Patient ID: Cindy [name], female   DOB: 08-06-1932, 79 y.o.   MRN: [id number] [name] is in stating that she had a small amount of bleeding 4 days ago which she has been having none at all in the past It is noted that she is many years status post hysterectomy She does have a vaginal Femring in place to provide local estrogen which her urologist is using for management of recurrent UTIs  On exam there is no blood in the vault The Femring is removed There is no abnormal discharge There is no evidence of any vaginal lesions or trauma which would explain the spotting  My impression is that the patient had a little vaginal mucosal operation from the [place] which came out as bleeding She is sexually active has sex about 2-3 weeks ago However she does remain somewhat atrophic and the Femring does sitting in fairly snugly which could certainly call some vaginal abrasion  Certainly there is no evidence of anything more serious going on in and [name] is reassured with that information  I'm bumping a percent or125 symptomatic as far as energy than 5 mcg from 112 mcg because she is

## 2012-12-07 ENCOUNTER — Ambulatory Visit: Payer: Medicare Other | Admitting: Obstetrics & Gynecology

## 2012-12-21 DIAGNOSIS — J019 Acute sinusitis, unspecified: Secondary | ICD-10-CM | POA: Diagnosis not present

## 2012-12-22 DIAGNOSIS — N952 Postmenopausal atrophic vaginitis: Secondary | ICD-10-CM | POA: Diagnosis not present

## 2013-01-09 DIAGNOSIS — J210 Acute bronchiolitis due to respiratory syncytial virus: Secondary | ICD-10-CM | POA: Diagnosis not present

## 2013-01-24 ENCOUNTER — Telehealth: Payer: Self-pay | Admitting: Gastroenterology

## 2013-01-24 DIAGNOSIS — J210 Acute bronchiolitis due to respiratory syncytial virus: Secondary | ICD-10-CM | POA: Diagnosis not present

## 2013-01-24 NOTE — Telephone Encounter (Signed)
I called pt to tell her that samples are out front

## 2013-01-25 DIAGNOSIS — M2669 Other specified disorders of temporomandibular joint: Secondary | ICD-10-CM | POA: Diagnosis not present

## 2013-01-25 DIAGNOSIS — M26629 Arthralgia of temporomandibular joint, unspecified side: Secondary | ICD-10-CM | POA: Diagnosis not present

## 2013-01-26 DIAGNOSIS — J320 Chronic maxillary sinusitis: Secondary | ICD-10-CM | POA: Diagnosis not present

## 2013-01-31 DIAGNOSIS — J209 Acute bronchitis, unspecified: Secondary | ICD-10-CM | POA: Diagnosis not present

## 2013-02-07 DIAGNOSIS — M79609 Pain in unspecified limb: Secondary | ICD-10-CM | POA: Diagnosis not present

## 2013-02-09 ENCOUNTER — Emergency Department (HOSPITAL_COMMUNITY): Payer: Medicare Other

## 2013-02-09 ENCOUNTER — Emergency Department (HOSPITAL_COMMUNITY)
Admission: EM | Admit: 2013-02-09 | Discharge: 2013-02-09 | Disposition: A | Discharge status: Home / Self Care | Payer: Medicare Other | Attending: Emergency Medicine | Admitting: Emergency Medicine

## 2013-02-09 ENCOUNTER — Encounter (HOSPITAL_COMMUNITY): Payer: Self-pay | Admitting: Emergency Medicine

## 2013-02-09 DIAGNOSIS — Z7982 Long term (current) use of aspirin: Secondary | ICD-10-CM | POA: Diagnosis not present

## 2013-02-09 DIAGNOSIS — M79604 Pain in right leg: Secondary | ICD-10-CM

## 2013-02-09 DIAGNOSIS — K219 Gastro-esophageal reflux disease without esophagitis: Secondary | ICD-10-CM | POA: Insufficient documentation

## 2013-02-09 DIAGNOSIS — Z79899 Other long term (current) drug therapy: Secondary | ICD-10-CM | POA: Diagnosis not present

## 2013-02-09 DIAGNOSIS — E785 Hyperlipidemia, unspecified: Secondary | ICD-10-CM | POA: Diagnosis not present

## 2013-02-09 DIAGNOSIS — M810 Age-related osteoporosis without current pathological fracture: Secondary | ICD-10-CM | POA: Insufficient documentation

## 2013-02-09 DIAGNOSIS — E039 Hypothyroidism, unspecified: Secondary | ICD-10-CM | POA: Insufficient documentation

## 2013-02-09 DIAGNOSIS — IMO0002 Reserved for concepts with insufficient information to code with codable children: Secondary | ICD-10-CM | POA: Insufficient documentation

## 2013-02-09 DIAGNOSIS — R609 Edema, unspecified: Secondary | ICD-10-CM | POA: Insufficient documentation

## 2013-02-09 DIAGNOSIS — Z792 Long term (current) use of antibiotics: Secondary | ICD-10-CM | POA: Diagnosis not present

## 2013-02-09 DIAGNOSIS — Z8709 Personal history of other diseases of the respiratory system: Secondary | ICD-10-CM | POA: Diagnosis not present

## 2013-02-09 DIAGNOSIS — S9000XA Contusion of unspecified ankle, initial encounter: Secondary | ICD-10-CM | POA: Diagnosis not present

## 2013-02-09 DIAGNOSIS — M7989 Other specified soft tissue disorders: Secondary | ICD-10-CM | POA: Diagnosis not present

## 2013-02-09 DIAGNOSIS — M79609 Pain in unspecified limb: Secondary | ICD-10-CM | POA: Diagnosis not present

## 2013-02-09 HISTORY — DX: Other specified disorders of nose and nasal sinuses: J34.89

## 2013-02-09 HISTORY — DX: Acute upper respiratory infection, unspecified: J06.9

## 2013-02-09 HISTORY — DX: Localized swelling, mass and lump, head: R22.0

## 2013-02-09 NOTE — ED Provider Notes (Signed)
CSN: [id number]     Arrival date & time 02/09/13  1349 History   First MD Initiated Contact with Patient 02/09/13 1456     Chief Complaint  Patient presents with  . Claudication   (Consider location/radiation/quality/duration/timing/severity/associated sxs/prior Treatment) The history is provided by the patient and the spouse.   patient presents for concern for having a blood clot in her right leg. Patient has been spending a lot of time in bed recently started walking on Monday just walking down the hallway got sudden pain in the calf and she noted local bruising around both sides of her ankle with some swelling this would be her right ankle. Patient denied any significant injury is not feels she overturned her ankle is no pain in the ankles her some mild discomfort and swelling noted to the right calf. No shortness of breath no chest pain. Patient seen earlier at urgent care recommended to treat with ice was unable to get confirmation about a DVT. Presented here for more information. Patient's primary care Dr. is at [facility].  Past Medical History  Diagnosis Date  . Osteoporosis, unspecified   . Edema   . Other and unspecified hyperlipidemia   . Unspecified hypothyroidism   . Diarrhea   . Other and unspecified noninfectious gastroenteritis and colitis(558.9)   . Family history of malignant neoplasm of gastrointestinal tract   . Flatulence, eructation, and gas pain   . GERD (gastroesophageal reflux disease)   . Stricture and stenosis of esophagus   . Esophagitis, unspecified   . Diverticulosis of colon (without mention of hemorrhage)   . Left sided ulcerative colitis   . URI (upper respiratory infection)   . Maxillary sinus mass    Past Surgical History  Procedure Laterality Date  . Abdominal hysterectomy    . Cholecystectomy    . Thyroidectomy, partial    . Foot surgery     Family History  Problem Relation Age of Onset  . Colon cancer Mother   . Stomach cancer Sister   .  Diabetes Brother   . Coronary artery disease Other   . Diabetes Other     Niece  . Diabetes Son    History  Substance Use Topics  . Smoking status: Never Smoker   . Smokeless tobacco: Never Used  . Alcohol Use: No   OB History   Grav Para Term Preterm Abortions TAB SAB Ect Mult Living   3 2 2  1  1         Review of Systems  Constitutional: Negative for fever.  HENT: Negative for congestion.   Eyes: Negative for visual disturbance.  Respiratory: Negative for shortness of breath.   Cardiovascular: Negative for chest pain.  Gastrointestinal: Negative for abdominal pain.  Genitourinary: Negative for dysuria and hematuria.  Musculoskeletal: Positive for joint swelling. Negative for back pain and neck pain.  Skin: Negative for rash.  Neurological: Negative for headaches.  Hematological: Does not bruise/bleed easily.  Psychiatric/Behavioral: Negative for confusion.    Allergies  Codeine and Sulfonamide derivatives  Home Medications   Current Outpatient Rx  Name  Route  Sig  Dispense  Refill  . acetaminophen (TYLENOL) 500 MG tablet   Oral   Take 500 mg by mouth every 6 (six) hours as needed. For pain          . aspirin EC 81 MG tablet   Oral   Take 81 mg by mouth 4 (four) times a week.          Marland Kitchen  Calcium Carbonate-Vit D-Min (CALCIUM 1200 PO)   Oral   Take 1 tablet by mouth at bedtime.          . cholecalciferol (VITAMIN D) 1000 UNITS tablet   Oral   Take 1,000 Units by mouth daily.           Marland Kitchen levothyroxine (SYNTHROID, LEVOTHROID) 112 MCG tablet   Oral   Take 112 mcg by mouth daily before breakfast.         . mesalamine (APRISO) 0.375 G 24 hr capsule      3 BY MOUTH ONCE DAILY   48 capsule   0     Samples given to patient    Lot# [id number]           ...   . omeprazole (PRILOSEC) 20 MG capsule   Oral   Take 1 capsule (20 mg total) by mouth daily.   30 capsule   0     Samples given Lot # [id number]  Exp:  4/15      2 ...   . PROAIR HFA 108 (90  BASE) MCG/ACT inhaler   Inhalation   Inhale 2 puffs into the lungs every 4 (four) hours as needed.         Marland Kitchen Propylene Glycol (SYSTANE BALANCE) 0.6 % SOLN   Ophthalmic   Apply 1 drop to eye daily as needed (dry eyes).          . vitamin C (ASCORBIC ACID) 500 MG tablet   Oral   Take 500 mg by mouth daily.         Marland Kitchen ZETIA 10 MG tablet      TAKE ONE TABLET BY MOUTH ONCE DAILY.   30 tablet   11   . amoxicillin-clavulanate (AUGMENTIN) 875-125 MG per tablet   Oral   Take 1 tablet by mouth 2 (two) times daily.         Marland Kitchen azithromycin (ZITHROMAX) 250 MG tablet   Oral   Take 1 tablet by mouth daily.         . cephALEXin (KEFLEX) 250 MG capsule   Oral   Take 1 capsule by mouth 4 (four) times daily - after meals and at bedtime. For 10 days         . ESTRING 2 MG vaginal ring               . fluticasone (FLONASE) 50 MCG/ACT nasal spray   Nasal   Place 2 sprays into the nose daily as needed.         Marland Kitchen levofloxacin (LEVAQUIN) 500 MG tablet   Oral   Take 500 mg by mouth daily. 9/22 for 10 days         . predniSONE (STERAPRED UNI-PAK) 5 MG TABS tablet                BP 116/60  Pulse 88  Temp(Src) 98 F (36.7 C)  Resp 20  Ht 5' 8"  (1.727 m)  Wt 150 lb (68.04 kg)  BMI 22.81 kg/m2  SpO2 98% Physical Exam  Nursing note and vitals reviewed. Constitutional: She is oriented to person, place, and time. She appears well-developed and well-nourished. No distress.  HENT:  Head: Normocephalic.  Mouth/Throat: Oropharynx is clear and moist.  Eyes: Conjunctivae and EOM are normal. Pupils are equal, round, and reactive to light. No scleral icterus.  Neck: Normal range of motion. Neck supple.  Cardiovascular: Normal rate, regular rhythm, normal heart sounds and intact  distal pulses.   No murmur heard. Pulmonary/Chest: Effort normal and breath sounds normal. No respiratory distress.  Abdominal: Soft. Bowel sounds are normal. There is no tenderness.   Musculoskeletal: Normal range of motion. She exhibits edema. She exhibits no tenderness.  Swelling to the right ankle with some ecchymosis evidence of old bruising. Deep purplish in color. Dorsalis pedis pulses 2+ Refill to the toes is 1 second or better. Mild calf discomfort mild swelling there. Achilles tendon appears to be intact no tenderness along the Achilles tendon.  Neurological: She is alert and oriented to person, place, and time. No cranial nerve deficit. She exhibits normal muscle tone. Coordination normal.  Skin: Skin is warm. No rash noted.    ED Course  Procedures (including critical care time) Labs Review Labs Reviewed - No data to display Imaging Review Dg Tibia/fibula Right  02/09/2013   CLINICAL DATA:  Right lower leg pain, throbbing sensation  EXAM: RIGHT TIBIA AND FIBULA - 2 VIEW  COMPARISON:  None.  FINDINGS: There is no evidence of fracture or other focal bone lesions. Soft tissues are unremarkable.  IMPRESSION: Negative.   Electronically Signed   By: Daryll Brod M.D.   On: 02/09/2013 18:12   Dg Ankle Complete Right  02/09/2013   CLINICAL DATA:  Right ankle pain, throbbing sensation, bruising  EXAM: RIGHT ANKLE - COMPLETE 3+ VIEW  COMPARISON:  02/09/2013  FINDINGS: There is no evidence of fracture, dislocation, or joint effusion. There is no evidence of arthropathy or other focal bone abnormality. Soft tissues are unremarkable.  IMPRESSION: Negative.   Electronically Signed   By: Daryll Brod M.D.   On: 02/09/2013 18:12   US Venous Img Lower Unilateral Right  02/09/2013   CLINICAL DATA:  Left lower extremity pain and swelling.  EXAM: RIGHT LOWER EXTREMITY VENOUS ULTRASOUND  TECHNIQUE: Gray-scale sonography with graded compression, as well as color Doppler and duplex ultrasound, were performed to evaluate the deep venous system from the level of the common femoral vein through the popliteal and proximal calf veins. Spectral Doppler was utilized to evaluate flow at rest  and with distal augmentation maneuvers.  COMPARISON:  None.  FINDINGS: Thrombus within deep veins:  None visualized.  Compressibility of deep veins:  Normal.  Duplex waveform respiratory phasicity:  Normal.  Duplex waveform response to augmentation:  Normal.  Venous reflux:  None visualized.  Other findings:  None visualized.  IMPRESSION: Negative venous Doppler examination for deep venous thrombosis.   Electronically Signed   By: Kalman Jewels M.D.   On: 02/09/2013 17:11    EKG Interpretation   None       MDM   1. Leg pain, diffuse, right    Workup for possible deep vein thrombosis was negative by Doppler. X-rays of the ankle and tib-fib without evidence of any bony injury. Patient's bruising around the ankle from may be secondary to a partial muscle tear up in the leg do not think that it was an ankle sprain because she has no tenderness in that area. Followup with her primary care doctor and/or orthopedics advised. Patient's nontoxic no acute distress.    Mervin Kung, MD 02/09/13 [phone]

## 2013-02-09 NOTE — ED Notes (Signed)
Patient c/o right calf pain. Per patient had been recently sick and laying around in bed, got up and started walking, sudden pain in calf. Per patient had swelling in calf and ankle. Large bruising with non-pitting edema noted to right ankle. Patient denies any injury. Patient reports going to urgent care but they were unable to tell her if she had DVT. Patient reports ice with some improvement in swelling.

## 2013-02-10 DIAGNOSIS — Z23 Encounter for immunization: Secondary | ICD-10-CM | POA: Diagnosis not present

## 2013-02-21 ENCOUNTER — Telehealth: Payer: Self-pay | Admitting: Gastroenterology

## 2013-02-23 NOTE — Telephone Encounter (Signed)
Patient came in the office and picked up samples

## 2013-03-02 ENCOUNTER — Other Ambulatory Visit: Payer: Self-pay | Admitting: Otolaryngology

## 2013-03-02 DIAGNOSIS — J322 Chronic ethmoidal sinusitis: Secondary | ICD-10-CM | POA: Diagnosis not present

## 2013-03-02 DIAGNOSIS — J320 Chronic maxillary sinusitis: Secondary | ICD-10-CM | POA: Diagnosis not present

## 2013-03-02 DIAGNOSIS — J328 Other chronic sinusitis: Secondary | ICD-10-CM | POA: Diagnosis not present

## 2013-03-13 ENCOUNTER — Telehealth: Payer: Self-pay | Admitting: Gastroenterology

## 2013-03-13 ENCOUNTER — Emergency Department (HOSPITAL_COMMUNITY): Payer: Medicare Other

## 2013-03-13 ENCOUNTER — Encounter (HOSPITAL_COMMUNITY): Payer: Self-pay | Admitting: Emergency Medicine

## 2013-03-13 ENCOUNTER — Observation Stay (HOSPITAL_COMMUNITY)
Admission: EM | Admit: 2013-03-13 | Discharge: 2013-03-15 | Disposition: A | Discharge status: Home / Self Care | Payer: Medicare Other | Attending: Internal Medicine | Admitting: Internal Medicine

## 2013-03-13 DIAGNOSIS — J40 Bronchitis, not specified as acute or chronic: Principal | ICD-10-CM | POA: Insufficient documentation

## 2013-03-13 DIAGNOSIS — Z800 Family history of malignant neoplasm of digestive organs: Secondary | ICD-10-CM

## 2013-03-13 DIAGNOSIS — E785 Hyperlipidemia, unspecified: Secondary | ICD-10-CM | POA: Diagnosis not present

## 2013-03-13 DIAGNOSIS — R0602 Shortness of breath: Secondary | ICD-10-CM | POA: Diagnosis not present

## 2013-03-13 DIAGNOSIS — K5150 Left sided colitis without complications: Secondary | ICD-10-CM

## 2013-03-13 DIAGNOSIS — R609 Edema, unspecified: Secondary | ICD-10-CM

## 2013-03-13 DIAGNOSIS — F411 Generalized anxiety disorder: Secondary | ICD-10-CM | POA: Diagnosis not present

## 2013-03-13 DIAGNOSIS — K7689 Other specified diseases of liver: Secondary | ICD-10-CM | POA: Diagnosis not present

## 2013-03-13 DIAGNOSIS — I059 Rheumatic mitral valve disease, unspecified: Secondary | ICD-10-CM | POA: Diagnosis not present

## 2013-03-13 DIAGNOSIS — J8410 Pulmonary fibrosis, unspecified: Secondary | ICD-10-CM | POA: Insufficient documentation

## 2013-03-13 DIAGNOSIS — K5289 Other specified noninfective gastroenteritis and colitis: Secondary | ICD-10-CM

## 2013-03-13 DIAGNOSIS — K219 Gastro-esophageal reflux disease without esophagitis: Secondary | ICD-10-CM | POA: Diagnosis not present

## 2013-03-13 DIAGNOSIS — R079 Chest pain, unspecified: Secondary | ICD-10-CM

## 2013-03-13 DIAGNOSIS — I5031 Acute diastolic (congestive) heart failure: Secondary | ICD-10-CM

## 2013-03-13 DIAGNOSIS — R9431 Abnormal electrocardiogram [ECG] [EKG]: Secondary | ICD-10-CM | POA: Diagnosis not present

## 2013-03-13 DIAGNOSIS — Z79899 Other long term (current) drug therapy: Secondary | ICD-10-CM | POA: Diagnosis not present

## 2013-03-13 DIAGNOSIS — Z86711 Personal history of pulmonary embolism: Secondary | ICD-10-CM | POA: Insufficient documentation

## 2013-03-13 DIAGNOSIS — R0789 Other chest pain: Secondary | ICD-10-CM | POA: Insufficient documentation

## 2013-03-13 DIAGNOSIS — J8489 Other specified interstitial pulmonary diseases: Secondary | ICD-10-CM | POA: Diagnosis present

## 2013-03-13 DIAGNOSIS — R599 Enlarged lymph nodes, unspecified: Secondary | ICD-10-CM | POA: Diagnosis not present

## 2013-03-13 DIAGNOSIS — M810 Age-related osteoporosis without current pathological fracture: Secondary | ICD-10-CM

## 2013-03-13 DIAGNOSIS — R0600 Dyspnea, unspecified: Secondary | ICD-10-CM

## 2013-03-13 DIAGNOSIS — K222 Esophageal obstruction: Secondary | ICD-10-CM

## 2013-03-13 DIAGNOSIS — E039 Hypothyroidism, unspecified: Secondary | ICD-10-CM | POA: Diagnosis not present

## 2013-03-13 DIAGNOSIS — Z7982 Long term (current) use of aspirin: Secondary | ICD-10-CM | POA: Insufficient documentation

## 2013-03-13 DIAGNOSIS — K5730 Diverticulosis of large intestine without perforation or abscess without bleeding: Secondary | ICD-10-CM

## 2013-03-13 HISTORY — DX: Pulmonary fibrosis, unspecified: J84.10

## 2013-03-13 LAB — COMPREHENSIVE METABOLIC PANEL
ALT: 9 U/L (ref 0–35)
AST: 17 U/L (ref 0–37)
Albumin: 3.3 g/dL — ABNORMAL LOW (ref 3.5–5.2)
Alkaline Phosphatase: 69 U/L (ref 39–117)
BUN: 9 mg/dL (ref 6–23)
CO2: 23 mEq/L (ref 19–32)
Calcium: 9.5 mg/dL (ref 8.4–10.5)
Chloride: 99 mEq/L (ref 96–112)
Creatinine, Ser: 0.49 mg/dL — ABNORMAL LOW (ref 0.50–1.10)
GFR calc Af Amer: >90 mL/min (ref >90)
GFR calc non Af Amer: >90 mL/min (ref >90)
Glucose, Bld: 121 mg/dL — ABNORMAL HIGH (ref 70–99)
Potassium: 4 mEq/L (ref 3.5–5.1)
Sodium: 132 mEq/L — ABNORMAL LOW (ref 135–145)
Total Bilirubin: 0.1 mg/dL — ABNORMAL LOW (ref 0.3–1.2)
Total Protein: 6.2 g/dL (ref 6.0–8.3)

## 2013-03-13 LAB — CBC WITH DIFFERENTIAL/PLATELET
Basophils Absolute: 0 10*3/uL (ref 0.0–0.1)
Basophils Relative: 1 % (ref 0–1)
Eosinophils Absolute: 0.1 10*3/uL (ref 0.0–0.7)
Eosinophils Relative: 2 % (ref 0–5)
HCT: 35 % — ABNORMAL LOW (ref 36.0–46.0)
Hemoglobin: 11.7 g/dL — ABNORMAL LOW (ref 12.0–15.0)
Lymphocytes Relative: 37 % (ref 12–46)
Lymphs Abs: 2.6 10*3/uL (ref 0.7–4.0)
MCH: 30.8 pg (ref 26.0–34.0)
MCHC: 33.4 g/dL (ref 30.0–36.0)
MCV: 92.1 fL (ref 78.0–100.0)
Monocytes Absolute: 0.8 10*3/uL (ref 0.1–1.0)
Monocytes Relative: 11 % (ref 3–12)
Neutro Abs: 3.5 10*3/uL (ref 1.7–7.7)
Neutrophils Relative %: 50 % (ref 43–77)
Platelets: 275 10*3/uL (ref 150–400)
RBC: 3.8 MIL/uL — ABNORMAL LOW (ref 3.87–5.11)
RDW: 13.5 % (ref 11.5–15.5)
WBC: 7 10*3/uL (ref 4.0–10.5)

## 2013-03-13 LAB — URINALYSIS, ROUTINE W REFLEX MICROSCOPIC
Bilirubin Urine: NEGATIVE
Glucose, UA: NEGATIVE mg/dL
Hgb urine dipstick: NEGATIVE
Ketones, ur: NEGATIVE mg/dL
Leukocytes, UA: NEGATIVE
Nitrite: NEGATIVE
Protein, ur: NEGATIVE mg/dL
Specific Gravity, Urine: 1.016 (ref 1.005–1.030)
Urobilinogen, UA: 0.2 mg/dL (ref 0.0–1.0)
pH: 7 (ref 5.0–8.0)

## 2013-03-13 LAB — POCT I-STAT TROPONIN I: Troponin i, poc: 0 ng/mL (ref 0.00–0.08)

## 2013-03-13 LAB — D-DIMER, QUANTITATIVE: D-Dimer, Quant: 1.43 ug/mL-FEU — ABNORMAL HIGH (ref 0.00–0.48)

## 2013-03-13 LAB — TROPONIN I: Troponin I: <0.3 ng/mL (ref <0.30)

## 2013-03-13 MED ORDER — ASPIRIN 81 MG PO CHEW
324.0000 mg | CHEWABLE_TABLET | Freq: Once | ORAL | Status: AC
  Administered 2013-03-13: 324 mg via ORAL
  Filled 2013-03-13: qty 4

## 2013-03-13 MED ORDER — SODIUM CHLORIDE 0.9 % IV BOLUS (SEPSIS)
500.0000 mL | Freq: Once | INTRAVENOUS | Status: AC
  Administered 2013-03-13: 500 mL via INTRAVENOUS

## 2013-03-13 MED ORDER — IOHEXOL 350 MG/ML SOLN
100.0000 mL | Freq: Once | INTRAVENOUS | Status: AC | PRN
  Administered 2013-03-13: 100 mL via INTRAVENOUS

## 2013-03-13 NOTE — Telephone Encounter (Signed)
.  left samples out front for patient

## 2013-03-13 NOTE — ED Notes (Signed)
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 Patient states that she began having SOB, tightness in her chest, and weakness since 03/03/13.

## 2013-03-13 NOTE — ED Provider Notes (Signed)
TIME SEEN: 7:28 PM  CHIEF COMPLAINT: Chest pain, shortness of breath  HPI: Patient is a 79 year old female with a history of hypothyroidism, hyperlipidemia who presents to the emergency department with complaints of chest pain shortness of breath since a recent sinus surgery on 03/02/13. Patient reports that she had a fungal mass removed by [facility]. She reports anesthesiologist told her that they had a difficult time intubating her. She states she was feeling fine after surgery but the next day began having shortness of breath with exertion and a dry cough. She also complains of chest tightness and feeling very tired and lightheaded. She denies any fever. No lower extremity swelling but states that 3 weeks ago she did have significant amount of pain in her right leg for 2 days improved after massage.  No prior history of PE or DVT. She denies a prior history of MI, stress test or cardiac catheterization. She has seen [facility] in the past. She denies having chest pain or shortness of breath currently at rest.  PCP is Hawkins in [employer] is [employer]) - last seen in 2012  ROS: See HPI Constitutional: no fever  Eyes: no drainage  ENT: no runny nose   Cardiovascular:   chest pain  Resp:  SOB  GI: no vomiting GU: no dysuria Integumentary: no rash  Allergy: no hives  Musculoskeletal: no leg swelling  Neurological: no slurred speech ROS otherwise negative  PAST MEDICAL HISTORY/PAST SURGICAL HISTORY:  Past Medical History  Diagnosis Date  . Osteoporosis, unspecified   . Edema   . Other and unspecified hyperlipidemia   . Unspecified hypothyroidism   . Diarrhea   . Other and unspecified noninfectious gastroenteritis and colitis(558.9)   . Family history of malignant neoplasm of gastrointestinal tract   . Flatulence, eructation, and gas pain   . GERD (gastroesophageal reflux disease)   . Stricture and stenosis of esophagus   . Esophagitis, unspecified   .  Diverticulosis of colon (without mention of hemorrhage)   . Left sided ulcerative colitis   . URI (upper respiratory infection)   . Maxillary sinus mass     MEDICATIONS:  Prior to Admission medications   Medication Sig Start Date End Date Taking? Authorizing Provider  aspirin EC 81 MG tablet Take 81 mg by mouth 4 (four) times a week.    Yes Historical Provider, MD  cephALEXin (KEFLEX) 250 MG capsule Take 1 capsule by mouth 4 (four) times daily - after meals and at bedtime. For 10 days 01/26/13  Yes Historical Provider, MD  cholecalciferol (VITAMIN D) 1000 UNITS tablet Take 1,000 Units by mouth daily.     Yes Historical Provider, MD  fluticasone (FLONASE) 50 MCG/ACT nasal spray Place 2 sprays into the nose daily as needed. 12/17/12  Yes Historical Provider, MD  levothyroxine (SYNTHROID, LEVOTHROID) 112 MCG tablet Take 112 mcg by mouth daily before breakfast.   Yes Historical Provider, MD  mesalamine (APRISO) 0.375 G 24 hr capsule 3 BY MOUTH ONCE DAILY 01/12/12  Yes Inda Castle, MD  naproxen (NAPROSYN) 250 MG tablet Take 250 mg by mouth 2 (two) times daily as needed (pain).   Yes Historical Provider, MD  omeprazole (PRILOSEC) 20 MG capsule Take 1 capsule (20 mg total) by mouth daily. 01/12/12  Yes Inda Castle, MD  Propylene Glycol (SYSTANE BALANCE) 0.6 % SOLN Apply 1 drop to eye daily as needed (dry eyes).    Yes Historical Provider, MD  vitamin C (ASCORBIC ACID) 500 MG tablet Take  500 mg by mouth daily.   Yes Historical Provider, MD  ZETIA 10 MG tablet TAKE ONE TABLET BY MOUTH ONCE DAILY. 10/22/12  Yes Florian Buff, MD    ALLERGIES:  Allergies  Allergen Reactions  . Codeine Nausea Only  . Sulfonamide Derivatives Nausea Only    SOCIAL HISTORY:  History  Substance Use Topics  . Smoking status: Never Smoker   . Smokeless tobacco: Never Used  . Alcohol Use: No    FAMILY HISTORY: Family History  Problem Relation Age of Onset  . Colon cancer Mother   . Stomach cancer Sister   .  Diabetes Brother   . Coronary artery disease Other   . Diabetes Other     Niece  . Diabetes Son    multiple brothers with coronary artery disease.  EXAM: BP 138/71  Pulse 102  Temp(Src) 98.3 F (36.8 C) (Oral)  Resp 16  SpO2 94% CONSTITUTIONAL: Alert and oriented and responds appropriately to questions. Well-appearing; well-nourished HEAD: Normocephalic EYES: Conjunctivae clear, PERRL ENT: normal nose; no rhinorrhea; moist mucous membranes; pharynx without lesions noted NECK: Supple, no meningismus, no LAD  CARD: RRR; S1 and S2 appreciated; no murmurs, no clicks, no rubs, no gallops RESP: Normal chest excursion without splinting or tachypnea; breath sounds clear and equal bilaterally; no wheezes, no rhonchi, no rales,  ABD/GI: Normal bowel sounds; non-distended; soft, non-tender, no rebound, no guarding BACK:  The back appears normal and is non-tender to palpation, there is no CVA tenderness EXT: Normal ROM in all joints; non-tender to palpation; no edema; normal capillary refill; no cyanosis    SKIN: Normal color for age and race; warm NEURO: Moves all extremities equally PSYCH: The patient's mood and manner are appropriate. Grooming and personal hygiene are appropriate.  MEDICAL DECISION MAKING: Patient here with concerning story for ACS. Her EKG shows new T wave inversions in her anterior leads compared to prior. Will obtain cardiac labs, d-dimer given her history of right lower extremity pain. Will give aspirin. Anticipate admission to the hospital for ACS rule out.  ED PROGRESS: Pt's labs are unremarkable. Troponin negative. D-dimer however is elevated. We'll obtain CT of her chest to rule out pulmonary embolus. Chest x-ray clear.   CT chest shows no evidence of pulmonary embolus. Patient does have vascular congestion and interstitial edema. She is stable chronic fibrosis. Will discuss with cardiology given her anterior T wave inversions and chest pain.   Spoke with Dr.  Donneta Romberg with cardiology.  He recommends admission to hospitalist with cardiology consult in AM.  Will d/w hospitalist.  EKG Interpretation    Date/Time:  Monday March 13 2013 18:54:41 EST Ventricular Rate:  106 PR Interval:  166 QRS Duration: 74 QT Interval:  347 QTC Calculation: 461 R Axis:   36 Text Interpretation:  Sinus tachycardia Baseline wander in lead(s) I III aVL aVF When compared with ECG of 08/31/2011 T wave inversions in anterior leads are new Confirmed by Nasia Cannan  DO, Maalle Starrett ([id number]) on 03/13/2013 7:28:05 PM               [place], DO 03/14/13 8891

## 2013-03-14 ENCOUNTER — Encounter (HOSPITAL_COMMUNITY): Payer: Self-pay | Admitting: Internal Medicine

## 2013-03-14 DIAGNOSIS — R0789 Other chest pain: Secondary | ICD-10-CM | POA: Diagnosis not present

## 2013-03-14 DIAGNOSIS — J40 Bronchitis, not specified as acute or chronic: Secondary | ICD-10-CM | POA: Diagnosis present

## 2013-03-14 DIAGNOSIS — R079 Chest pain, unspecified: Secondary | ICD-10-CM

## 2013-03-14 DIAGNOSIS — J8489 Other specified interstitial pulmonary diseases: Secondary | ICD-10-CM | POA: Diagnosis present

## 2013-03-14 DIAGNOSIS — R0609 Other forms of dyspnea: Secondary | ICD-10-CM

## 2013-03-14 DIAGNOSIS — I059 Rheumatic mitral valve disease, unspecified: Secondary | ICD-10-CM | POA: Diagnosis not present

## 2013-03-14 DIAGNOSIS — J8410 Pulmonary fibrosis, unspecified: Secondary | ICD-10-CM | POA: Diagnosis not present

## 2013-03-14 DIAGNOSIS — R0989 Other specified symptoms and signs involving the circulatory and respiratory systems: Secondary | ICD-10-CM | POA: Diagnosis not present

## 2013-03-14 HISTORY — DX: Pulmonary fibrosis, unspecified: J84.10

## 2013-03-14 LAB — HEMOGLOBIN A1C
Hgb A1c MFr Bld: 6.1 % — ABNORMAL HIGH (ref <5.7)
Mean Plasma Glucose: 128 mg/dL — ABNORMAL HIGH (ref <117)

## 2013-03-14 LAB — CBC
HCT: 35.4 % — ABNORMAL LOW (ref 36.0–46.0)
Hemoglobin: 11.6 g/dL — ABNORMAL LOW (ref 12.0–15.0)
MCH: 30.1 pg (ref 26.0–34.0)
MCHC: 32.8 g/dL (ref 30.0–36.0)
MCV: 91.9 fL (ref 78.0–100.0)
Platelets: 271 10*3/uL (ref 150–400)
RBC: 3.85 MIL/uL — ABNORMAL LOW (ref 3.87–5.11)
RDW: 13.4 % (ref 11.5–15.5)
WBC: 7.4 10*3/uL (ref 4.0–10.5)

## 2013-03-14 LAB — TROPONIN I
Troponin I: <0.3 ng/mL (ref <0.30)
Troponin I: <0.3 ng/mL (ref <0.30)
Troponin I: <0.3 ng/mL (ref <0.30)

## 2013-03-14 LAB — CREATININE, SERUM
Creatinine, Ser: 0.52 mg/dL (ref 0.50–1.10)
GFR calc Af Amer: >90 mL/min (ref >90)
GFR calc non Af Amer: 89 mL/min — ABNORMAL LOW (ref >90)

## 2013-03-14 LAB — TSH: TSH: 3.005 u[IU]/mL (ref 0.350–4.500)

## 2013-03-14 LAB — PRO B NATRIURETIC PEPTIDE: Pro B Natriuretic peptide (BNP): 386 pg/mL (ref 0–450)

## 2013-03-14 MED ORDER — MESALAMINE ER 0.375 G PO CP24
1.1250 g | ORAL_CAPSULE | Freq: Every day | ORAL | Status: DC
  Administered 2013-03-14: 1.125 g via ORAL

## 2013-03-14 MED ORDER — LEVOTHYROXINE SODIUM 112 MCG PO TABS
112.0000 ug | ORAL_TABLET | Freq: Every day | ORAL | Status: DC
  Administered 2013-03-14 – 2013-03-15 (×2): 112 ug via ORAL
  Filled 2013-03-14 (×3): qty 1

## 2013-03-14 MED ORDER — PREDNISONE 20 MG PO TABS
40.0000 mg | ORAL_TABLET | Freq: Every day | ORAL | Status: DC
  Administered 2013-03-14 – 2013-03-15 (×2): 40 mg via ORAL
  Filled 2013-03-14 (×3): qty 2

## 2013-03-14 MED ORDER — FLUTICASONE PROPIONATE 50 MCG/ACT NA SUSP: 2.0000 | Freq: Every day | NASAL | Status: DC | PRN

## 2013-03-14 MED ORDER — FUROSEMIDE 40 MG PO TABS
40.0000 mg | ORAL_TABLET | Freq: Two times a day (BID) | ORAL | Status: DC
  Administered 2013-03-14 – 2013-03-15 (×2): 40 mg via ORAL
  Filled 2013-03-14 (×5): qty 1

## 2013-03-14 MED ORDER — VITAMIN D3 25 MCG (1000 UNIT) PO TABS
1000.0000 [IU] | ORAL_TABLET | Freq: Every day | ORAL | Status: DC
  Administered 2013-03-14 – 2013-03-15 (×2): 1000 [IU] via ORAL
  Filled 2013-03-14 (×2): qty 1

## 2013-03-14 MED ORDER — FUROSEMIDE 10 MG/ML IJ SOLN
40.0000 mg | Freq: Once | INTRAMUSCULAR | Status: AC
  Administered 2013-03-14: 40 mg via INTRAVENOUS
  Filled 2013-03-14: qty 4

## 2013-03-14 MED ORDER — NAPROXEN 250 MG PO TABS
250.0000 mg | ORAL_TABLET | Freq: Two times a day (BID) | ORAL | Status: DC | PRN
  Administered 2013-03-14: 250 mg via ORAL
  Filled 2013-03-14 (×2): qty 1

## 2013-03-14 MED ORDER — CEPHALEXIN 250 MG PO CAPS
250.0000 mg | ORAL_CAPSULE | Freq: Three times a day (TID) | ORAL | Status: DC
  Administered 2013-03-14 – 2013-03-15 (×5): 250 mg via ORAL
  Filled 2013-03-14 (×9): qty 1

## 2013-03-14 MED ORDER — ACETAMINOPHEN 325 MG PO TABS
650.0000 mg | ORAL_TABLET | Freq: Four times a day (QID) | ORAL | Status: DC | PRN
  Administered 2013-03-15: 650 mg via ORAL
  Filled 2013-03-14: qty 2

## 2013-03-14 MED ORDER — POTASSIUM CHLORIDE CRYS ER 20 MEQ PO TBCR
20.0000 meq | EXTENDED_RELEASE_TABLET | Freq: Once | ORAL | Status: AC
  Administered 2013-03-14: 20 meq via ORAL
  Filled 2013-03-14: qty 1

## 2013-03-14 MED ORDER — ALBUTEROL SULFATE (5 MG/ML) 0.5% IN NEBU: 2.5000 mg | INHALATION_SOLUTION | RESPIRATORY_TRACT | Status: DC | PRN

## 2013-03-14 MED ORDER — ALBUTEROL SULFATE (5 MG/ML) 0.5% IN NEBU
2.5000 mg | INHALATION_SOLUTION | Freq: Four times a day (QID) | RESPIRATORY_TRACT | Status: DC
  Administered 2013-03-14: 2.5 mg via RESPIRATORY_TRACT
  Filled 2013-03-14: qty 0.5

## 2013-03-14 MED ORDER — ALBUTEROL SULFATE (5 MG/ML) 0.5% IN NEBU
2.5000 mg | INHALATION_SOLUTION | Freq: Four times a day (QID) | RESPIRATORY_TRACT | Status: DC
  Administered 2013-03-14 – 2013-03-15 (×4): 2.5 mg via RESPIRATORY_TRACT
  Filled 2013-03-14 (×4): qty 0.5

## 2013-03-14 MED ORDER — ALPRAZOLAM 0.5 MG PO TABS
0.5000 mg | ORAL_TABLET | Freq: Once | ORAL | Status: AC
  Administered 2013-03-14: 0.5 mg via ORAL
  Filled 2013-03-14: qty 1

## 2013-03-14 MED ORDER — ASPIRIN EC 81 MG PO TBEC
81.0000 mg | DELAYED_RELEASE_TABLET | ORAL | Status: DC
  Administered 2013-03-14: 81 mg via ORAL
  Filled 2013-03-14 (×2): qty 1

## 2013-03-14 MED ORDER — EZETIMIBE 10 MG PO TABS
10.0000 mg | ORAL_TABLET | Freq: Every day | ORAL | Status: DC
  Administered 2013-03-14 – 2013-03-15 (×2): 10 mg via ORAL
  Filled 2013-03-14 (×2): qty 1

## 2013-03-14 MED ORDER — PANTOPRAZOLE SODIUM 40 MG PO TBEC
40.0000 mg | DELAYED_RELEASE_TABLET | Freq: Every day | ORAL | Status: DC
  Administered 2013-03-14 – 2013-03-15 (×2): 40 mg via ORAL
  Filled 2013-03-14 (×2): qty 1

## 2013-03-14 MED ORDER — POLYVINYL ALCOHOL 1.4 % OP SOLN
1.0000 [drp] | OPHTHALMIC | Status: DC | PRN
  Filled 2013-03-14: qty 15

## 2013-03-14 MED ORDER — ACETAMINOPHEN 650 MG RE SUPP: 650.0000 mg | Freq: Four times a day (QID) | RECTAL | Status: DC | PRN

## 2013-03-14 MED ORDER — ENOXAPARIN SODIUM 40 MG/0.4ML ~~LOC~~ SOLN
40.0000 mg | SUBCUTANEOUS | Status: DC
  Administered 2013-03-14: 40 mg via SUBCUTANEOUS
  Filled 2013-03-14 (×2): qty 0.4

## 2013-03-14 MED ORDER — GUAIFENESIN-DM 100-10 MG/5ML PO SYRP: 5.0000 mL | ORAL_SOLUTION | ORAL | Status: DC | PRN

## 2013-03-14 MED ORDER — SODIUM CHLORIDE 0.9 % IJ SOLN
3.0000 mL | Freq: Two times a day (BID) | INTRAMUSCULAR | Status: DC
  Administered 2013-03-14 (×2): 3 mL via INTRAVENOUS

## 2013-03-14 MED ORDER — VITAMIN C 500 MG PO TABS
500.0000 mg | ORAL_TABLET | Freq: Every day | ORAL | Status: DC
  Administered 2013-03-14 – 2013-03-15 (×2): 500 mg via ORAL
  Filled 2013-03-14 (×2): qty 1

## 2013-03-14 MED ORDER — PROPYLENE GLYCOL 0.6 % OP SOLN: 1.0000 [drp] | Freq: Every day | OPHTHALMIC | Status: DC | PRN

## 2013-03-14 MED ORDER — ALBUTEROL SULFATE HFA 108 (90 BASE) MCG/ACT IN AERS
2.0000 | INHALATION_SPRAY | Freq: Four times a day (QID) | RESPIRATORY_TRACT | Status: DC
  Filled 2013-03-14: qty 6.7

## 2013-03-14 MED ORDER — MESALAMINE ER 0.375 G PO CP24: 1.5000 g | ORAL_CAPSULE | Freq: Every day | ORAL | Status: DC

## 2013-03-14 NOTE — Progress Notes (Signed)
Echocardiogram 2D Echocardiogram has been performed.  Cindy [name] 03/14/2013, 10:15 AM

## 2013-03-14 NOTE — Progress Notes (Addendum)
[facility] PROGRESS NOTE   Cindy [name] ZOX:[id number] DOB: 1933-03-18 DOA: 03/13/2013 PCP: [name], MD  Brief narrative: Cindy [name] is an 79 y.o. female with a PMH of hyperlipidemia, hypothyroidism, history of ulcerative colitis who presented to the ED 03/14/2013 with dyspnea on exertion for past 10 days associated with cough and chest tightness for the same duration. Her symptoms were thought to be due to bronchitis and interstitial pneumonitis. She was admitted for further evaluation and workup.  Assessment/Plan: Principal Problem:   Shortness of breath / bronchitis / chronic fibrosis / interstitial pneumonitis Patient underwent a CT angiogram of the chest which showed: No evidence of significant pulmonary embolus. Prominent pulmonary vascularity with interstitial changes likely representing vascular congestion and interstitial edema. Stable chronic fibrosis and mild mediastinal lymphadenopathy. Two-dimensional echocardiogram ordered for further evaluation, which showed grade 2 diastolic dysfunction and an EF of 55-65%. Pro BNP 386. Troponin I negative. Currently on oral prednisone and scheduled bronchodilator treatments, without significant improvement. Also on Keflex secondary to recent sinus surgery, which has been continued. This time, would treat her with Lasix and if this improves her symptoms, attribute her shortness of breath to decompensated diastolic CHF as opposed to an infectious etiology. Active Problems:   HYPOTHYROIDISM Continue home dose of Synthroid. TSH 3.005.   HYPERLIPIDEMIA Continue Zetia.   GERD Continue PPI therapy.   Chest tightness Ruling out ACS with serial enzymes and EKG.  Troponins negative.   Code Status: Full. Family Communication: Husband updated at bedside. Disposition Plan: Home when stable.   IV access:  Peripheral IV  Medical Consultants:  None.  Other  Consultants:  None.  Anti-infectives:  Keflex  HPI/Subjective: Cindy [name] tells me she does not feel significantly improved since admission. Still a bit short of breath and with some chest tightness.  Objective: Filed Vitals:   03/13/13 1849 03/14/13 0100 03/14/13 0113 03/14/13 0500  BP: 138/71 145/67  131/75  Pulse: 102 92  79  Temp: 98.3 F (36.8 C) 98.2 F (36.8 C)  98 F (36.7 C)  TempSrc: Oral Oral  Oral  Resp: 16 14  15   Height:  5' 8"  (1.727 m)    Weight:  72.7 kg (160 lb 4.4 oz)    SpO2: 94% 97% 94% 95%    Intake/Output Summary (Last 24 hours) at 03/14/13 0820 Last data filed at 03/14/13 0319  Gross per 24 hour  Intake      3 ml  Output      0 ml  Net      3 ml    Exam: Gen:  NAD Cardiovascular:  RRR, No M/R/G Respiratory:  Lungs CTAB Gastrointestinal:  Abdomen soft, NT/ND, + BS Extremities:  No C/E/C  Data Reviewed: asic Metabolic Panel:  Recent Labs Lab 03/13/13 2010 03/14/13 0120  NA 132*  --   K 4.0  --   CL 99  --   CO2 23  --   GLUCOSE 121*  --   BUN 9  --   CREATININE 0.49* 0.52  CALCIUM 9.5  --    GFR Estimated Creatinine Clearance: 58.5 ml/min (by C-G formula based on Cr of 0.52). Liver Function Tests:  Recent Labs Lab 03/13/13 2010  AST 17  ALT 9  ALKPHOS 69  BILITOT 0.1*  PROT 6.2  ALBUMIN 3.3*    CBC:  Recent Labs Lab 03/13/13 2010 03/14/13 0120  WBC 7.0 7.4  NEUTROABS 3.5  --   HGB 11.7* 11.6*  HCT 35.0* 35.4*  MCV 92.1 91.9  PLT 275 271   Cardiac Enzymes:  Recent Labs Lab 03/13/13 2010  TROPONINI <0.30   BNP (last 3 results)  Recent Labs  03/13/13 2010  PROBNP 386.0   D-Dimer  Recent Labs  03/13/13 2010  DDIMER 1.43*   Hgb A1c  Recent Labs  03/14/13 0120  HGBA1C 6.1*   Thyroid function studies  Recent Labs  03/14/13 0120  TSH 3.005    Procedures and Diagnostic Studies: Dg Chest 2 View  03/13/2013   CLINICAL DATA:  Chest pain and shortness of breath  EXAM: CHEST  2  VIEW  COMPARISON:  01/09/2013  FINDINGS: Cardiac shadow is stable. Diffuse fibrotic changes are again identified throughout both lungs. No focal infiltrate or sizable effusion is seen. No acute bony abnormality is noted.  IMPRESSION: Chronic changes without acute abnormality.   Electronically Signed   By: Inez Catalina M.D.   On: 03/13/2013 20:39   Ct Angio Chest Pe W/cm &/or Wo Cm  03/13/2013   CLINICAL DATA:  Shortness of breath.  History of pulmonary embolus.  EXAM: CT ANGIOGRAPHY CHEST WITH CONTRAST  TECHNIQUE: Multidetector CT imaging of the chest was performed using the standard protocol during bolus administration of intravenous contrast. Multiplanar CT image reconstructions including MIPs were obtained to evaluate the vascular anatomy.  CONTRAST:  135m OMNIPAQUE IOHEXOL 350 MG/ML SOLN  COMPARISON:  12/09/2010  FINDINGS: Technically adequate study with good opacification of the central and segmental pulmonary arteries. No focal filling defects are demonstrated. No evidence of significant pulmonary embolus. Prominent pulmonary vascularity suggesting fluid overload or failure.  Normal heart size. There is lymphadenopathy in the mediastinum predominantly in the pretracheal and aortopulmonic window region. Lymph nodes appear stable in size since previous study. Largest lymph node is measured at about 11 mm. The esophagus is decompressed. There is slight pleural thickening in the left costophrenic angle, new since previous study. No significant pleural effusions. Images of the upper abdominal organs demonstrate a cyst in the left lobe of the liver, stable since previous study.  There is diffuse peripheral infiltration in the lungs consistent with fibrosis. Since the previous study, there is increased interstitial pattern with thickening of the interlobular septa suggesting edema or interstitial pneumonitis. Patchy fibrotic infiltrative changes in the lung apices are stable since previous study. No  pneumothorax. Airways appear patent. No destructive bone lesions are appreciated. The  Review of the MIP images confirms the above findings.  IMPRESSION: No evidence of significant pulmonary embolus. Prominent pulmonary vascularity with interstitial changes likely representing vascular congestion and interstitial edema. Stable chronic fibrosis and mild mediastinal lymphadenopathy.   Electronically Signed   By: WLucienne CapersM.D.   On: 03/13/2013 22:33    Scheduled Meds: . albuterol  2.5 mg Nebulization Q6H WA  . aspirin EC  81 mg Oral 4 times weekly  . cephALEXin  250 mg Oral TID PC & HS  . cholecalciferol  1,000 Units Oral Daily  . enoxaparin (LOVENOX) injection  40 mg Subcutaneous Q24H  . ezetimibe  10 mg Oral Daily  . furosemide  40 mg Oral BID  . levothyroxine  112 mcg Oral QAC breakfast  . mesalamine  1.5 g Oral Daily  . pantoprazole  40 mg Oral Daily  . predniSONE  40 mg Oral Q breakfast  . sodium chloride  3 mL Intravenous Q12H  . vitamin C  500 mg Oral Daily   Continuous Infusions:   Time spent: 35 minutes with > 50% of time  discussing current diagnostic test results, clinical impression and plan of care.    LOS: 1 day   Cindy [name]  [facility] Pager [phone].   *Please note that the hospitalists switch teams on Wednesdays. Please call the flow manager at [phone] if you are having difficulty reaching the hospitalist taking care of this patient as she can update you and provide the most up-to-date pager number of provider caring for the patient. If 8PM-8AM, please contact night-coverage at [email or web address], password [facility]  03/14/2013, 8:20 AM

## 2013-03-14 NOTE — Progress Notes (Signed)
UR completed 

## 2013-03-14 NOTE — H&P (Signed)
[facility] History and Physical  Cindy [name] TXM:[id number] DOB: Nov 18, 1932 DOA: 03/13/2013  Referring physician: Dr. Leonides Schanz PCP: [name], MD   Chief Complaint:  Dyspnea on exertion for 10 days Chest tightness for 10 days  HPI:  79 year old female with history of Hyperlipidemia, hypothyroidism, history of ulcerative colitis who presented to the ED with dyspnea on exertion for past 10 days associated with cough and chest tightness for the same duration. She denies orthopnea or PND. Patient reports having associated for fungal sinusitis about 10 days back by Dr Erik Obey ( ENT) during which she had difficult intubation. Post surgery she has failed increased dyspnea on exertion associated with cough with clear phlegm. She also had central chest tightness with dyspnea for the same duration. She reports being on different medications for bronchitis for several weeks prior to the sinus infection. She denies any fever or chills, headache, blurred vision, nausea, vomiting, abdominal pain, palpitations, dyspnea, bowel symptoms. She denies any stress test done in the past. Patient does report off and on leg swelling.  Course in the ED Patient's vitals were stable. Blood work done was unremarkable. Chest x-ray done showed diffuse fibrotic changes. Patient had also complained of right leg swelling recently interstitial changes suggestive of possible interstitial pneumonitis. An EKG done showed T-wave inversion in anterior leads which was new compared to previous EKG in 2013.  cardiology fellow on call was consulted and recommended admission to hospital service given low risk of symptoms and consult cardiology if needed.   Review of Systems:  Constitutional: Denies fever, chills, diaphoresis, appetite change. Reports fatigue HEENT: Denies photophobia, ear pain, congestion, sore throat, rhinorrhea, sneezing, mouth sores, trouble swallowing, neck pain, neck stiffness and tinnitus.    Respiratory: SOB, DOE, cough, chest tightness,   Denies wheezing.   Cardiovascular: chest pain, and leg swelling. Denies  Palpitations. Gastrointestinal: Denies nausea, vomiting, abdominal pain, diarrhea, constipation, blood in stool and abdominal distention.  Genitourinary: Denies dysuria, urgency, frequency, hematuria, flank pain and difficulty urinating.  Endocrine: Denies polyuria, polydipsia. Musculoskeletal: Denies myalgias, back pain, joint swelling, arthralgias and gait problem.  Neurological: Denies dizziness, seizures, syncope, weakness, light-headedness, numbness and headaches.  Hematological: Denies adenopathy.  Psychiatric/Behavioral: Denies nervousness, sleep disturbance and agitation   Past Medical History  Diagnosis Date  . Osteoporosis, unspecified   . Edema   . Other and unspecified hyperlipidemia   . Unspecified hypothyroidism   . Diarrhea   . Other and unspecified noninfectious gastroenteritis and colitis(558.9)   . Family history of malignant neoplasm of gastrointestinal tract   . Flatulence, eructation, and gas pain   . GERD (gastroesophageal reflux disease)   . Stricture and stenosis of esophagus   . Esophagitis, unspecified   . Diverticulosis of colon (without mention of hemorrhage)   . Left sided ulcerative colitis   . URI (upper respiratory infection)   . Maxillary sinus mass    Past Surgical History  Procedure Laterality Date  . Abdominal hysterectomy    . Cholecystectomy    . Thyroidectomy, partial    . Foot surgery     Social History:  reports that she has never smoked. She has never used smokeless tobacco. She reports that she does not drink alcohol or use illicit drugs.  Allergies  Allergen Reactions  . Codeine Nausea Only  . Sulfonamide Derivatives Nausea Only    Family History  Problem Relation Age of Onset  . Colon cancer Mother   . Stomach cancer Sister   . Diabetes Brother   .  Coronary artery disease Other   . Diabetes Other      Niece  . Diabetes Son     Prior to Admission medications   Medication Sig Start Date End Date Taking? Authorizing Provider  aspirin EC 81 MG tablet Take 81 mg by mouth 4 (four) times a week.    Yes Historical Provider, MD  cephALEXin (KEFLEX) 250 MG capsule Take 1 capsule by mouth 4 (four) times daily - after meals and at bedtime. For 10 days 01/26/13  Yes Historical Provider, MD  cholecalciferol (VITAMIN D) 1000 UNITS tablet Take 1,000 Units by mouth daily.     Yes Historical Provider, MD  fluticasone (FLONASE) 50 MCG/ACT nasal spray Place 2 sprays into the nose daily as needed. 12/17/12  Yes Historical Provider, MD  levothyroxine (SYNTHROID, LEVOTHROID) 112 MCG tablet Take 112 mcg by mouth daily before breakfast.   Yes Historical Provider, MD  mesalamine (APRISO) 0.375 G 24 hr capsule 3 BY MOUTH ONCE DAILY 01/12/12  Yes Inda Castle, MD  naproxen (NAPROSYN) 250 MG tablet Take 250 mg by mouth 2 (two) times daily as needed (pain).   Yes Historical Provider, MD  omeprazole (PRILOSEC) 20 MG capsule Take 1 capsule (20 mg total) by mouth daily. 01/12/12  Yes Inda Castle, MD  Propylene Glycol (SYSTANE BALANCE) 0.6 % SOLN Apply 1 drop to eye daily as needed (dry eyes).    Yes Historical Provider, MD  vitamin C (ASCORBIC ACID) 500 MG tablet Take 500 mg by mouth daily.   Yes Historical Provider, MD  ZETIA 10 MG tablet TAKE ONE TABLET BY MOUTH ONCE DAILY. 10/22/12  Yes Florian Buff, MD    Physical Exam:  Filed Vitals:   03/13/13 1849  BP: 138/71  Pulse: 102  Temp: 98.3 F (36.8 C)  TempSrc: Oral  Resp: 16  SpO2: 94%    Constitutional: Vital signs reviewed.  Patient is an elderly female lying in bed in no acute distress HEENT: No pallor, no icterus, moist oral mucosa Cardiovascular: RRR, S1 normal, S2 normal, no MRG, Pulmonary/Chest: Equal air entry bilaterally, coarse crackles over bilateral lung bases, no rhonchi or wheeze Abdominal: Soft. Non-tender, non-distended, bowel sounds are  normal, no masses, organomegaly, or guarding present.   extremities: Warm, trace edema CNS: AAO x3  Labs on Admission:  Basic Metabolic Panel:  Recent Labs Lab 03/13/13 2010  NA 132*  K 4.0  CL 99  CO2 23  GLUCOSE 121*  BUN 9  CREATININE 0.49*  CALCIUM 9.5   Liver Function Tests:  Recent Labs Lab 03/13/13 2010  AST 17  ALT 9  ALKPHOS 69  BILITOT 0.1*  PROT 6.2  ALBUMIN 3.3*   No results found for this basename: LIPASE, AMYLASE,  in the last 168 hours No results found for this basename: AMMONIA,  in the last 168 hours CBC:  Recent Labs Lab 03/13/13 2010  WBC 7.0  NEUTROABS 3.5  HGB 11.7*  HCT 35.0*  MCV 92.1  PLT 275   Cardiac Enzymes:  Recent Labs Lab 03/13/13 2010  TROPONINI <0.30   BNP: No components found with this basename: POCBNP,  CBG: No results found for this basename: GLUCAP,  in the last 168 hours  Radiological Exams on Admission: Dg Chest 2 View  03/13/2013   CLINICAL DATA:  Chest pain and shortness of breath  EXAM: CHEST  2 VIEW  COMPARISON:  01/09/2013  FINDINGS: Cardiac shadow is stable. Diffuse fibrotic changes are again identified throughout both lungs. No  focal infiltrate or sizable effusion is seen. No acute bony abnormality is noted.  IMPRESSION: Chronic changes without acute abnormality.   Electronically Signed   By: Inez Catalina M.D.   On: 03/13/2013 20:39   Ct Angio Chest Pe W/cm &/or Wo Cm  03/13/2013   CLINICAL DATA:  Shortness of breath.  History of pulmonary embolus.  EXAM: CT ANGIOGRAPHY CHEST WITH CONTRAST  TECHNIQUE: Multidetector CT imaging of the chest was performed using the standard protocol during bolus administration of intravenous contrast. Multiplanar CT image reconstructions including MIPs were obtained to evaluate the vascular anatomy.  CONTRAST:  117m OMNIPAQUE IOHEXOL 350 MG/ML SOLN  COMPARISON:  12/09/2010  FINDINGS: Technically adequate study with good opacification of the central and segmental pulmonary  arteries. No focal filling defects are demonstrated. No evidence of significant pulmonary embolus. Prominent pulmonary vascularity suggesting fluid overload or failure.  Normal heart size. There is lymphadenopathy in the mediastinum predominantly in the pretracheal and aortopulmonic window region. Lymph nodes appear stable in size since previous study. Largest lymph node is measured at about 11 mm. The esophagus is decompressed. There is slight pleural thickening in the left costophrenic angle, new since previous study. No significant pleural effusions. Images of the upper abdominal organs demonstrate a cyst in the left lobe of the liver, stable since previous study.  There is diffuse peripheral infiltration in the lungs consistent with fibrosis. Since the previous study, there is increased interstitial pattern with thickening of the interlobular septa suggesting edema or interstitial pneumonitis. Patchy fibrotic infiltrative changes in the lung apices are stable since previous study. No pneumothorax. Airways appear patent. No destructive bone lesions are appreciated. The  Review of the MIP images confirms the above findings.  IMPRESSION: No evidence of significant pulmonary embolus. Prominent pulmonary vascularity with interstitial changes likely representing vascular congestion and interstitial edema. Stable chronic fibrosis and mild mediastinal lymphadenopathy.   Electronically Signed   By: WLucienne CapersM.D.   On: 03/13/2013 22:33    EKG: NSR, TWI in v1-v3  Assessment/Plan Principal Problem:   Shortness of breath Symptoms likely in the setting of mild acute bronchitis and interstitial pneumonitis seen on imaging with diffuse fibrotic changes. Patient has bibasilar crackles on exam and trace pedal edema. Check proBNP and order a 2-D echo to evaluate for LV function. -I would place her on oral prednisone and scheduled nebs . -Patient is on Keflex post her recent sinus surgery which I will  continue.  Active Problems: Chest tightness Appears respiratory in nature with interstitial penumonitis seen on imaging. She does have T-wave inversion on anterior leads TDorothea Ogledoes not have typical chest pain symptoms. However her MACE score is 4 and thus will r/o for ACS with serial cardiac enzymes and EKG. Will check pro BNP and 2D echo. If her symptoms subside and she did enzymes are negative she can followup with outpatient cardiology for further evaluation.    HYPOTHYROIDISM Continue Synthroid. We'll check TSH    HYPERLIPIDEMIA Continues Zetia    GERD continue PPI   Diet: Cardiac DVT prophylaxis: Subcutaneous Lovenox  Code Status: Full code Family Communication: Son at bedside Disposition Plan: Home once improved  DLouellen Molder[facility] Pager [phone] If 7PM-7AM, please contact night-coverage [email or web address] Password [facility]11/25/2014, 12:39 AM   Total time spent on admission: 55 minutes

## 2013-03-15 DIAGNOSIS — R0609 Other forms of dyspnea: Secondary | ICD-10-CM | POA: Diagnosis not present

## 2013-03-15 DIAGNOSIS — R079 Chest pain, unspecified: Secondary | ICD-10-CM | POA: Diagnosis not present

## 2013-03-15 DIAGNOSIS — K219 Gastro-esophageal reflux disease without esophagitis: Secondary | ICD-10-CM

## 2013-03-15 DIAGNOSIS — I5031 Acute diastolic (congestive) heart failure: Secondary | ICD-10-CM | POA: Diagnosis not present

## 2013-03-15 DIAGNOSIS — R0989 Other specified symptoms and signs involving the circulatory and respiratory systems: Secondary | ICD-10-CM | POA: Diagnosis not present

## 2013-03-15 DIAGNOSIS — I509 Heart failure, unspecified: Secondary | ICD-10-CM

## 2013-03-15 MED ORDER — POTASSIUM CHLORIDE ER 20 MEQ PO TBCR: 10.0000 meq | EXTENDED_RELEASE_TABLET | Freq: Every day | ORAL | Status: DC

## 2013-03-15 MED ORDER — FUROSEMIDE 40 MG PO TABS: 40.0000 mg | ORAL_TABLET | Freq: Every day | ORAL | Status: DC

## 2013-03-15 MED ORDER — ALPRAZOLAM 0.5 MG PO TABS: 0.5000 mg | ORAL_TABLET | Freq: Every evening | ORAL | Status: AC | PRN

## 2013-03-15 NOTE — Discharge Summary (Signed)
Physician Discharge Summary  Cindy [name] GNF:[id number] DOB: 12-Sep-1932 DOA: 03/13/2013  PCP: [name], MD  Admit date: 03/13/2013 Discharge date: 03/15/2013  Recommendations for Outpatient Follow-up:  1. For diastolic heart failure we have started you on lasix 40 mg  A day. You will be called by [place] cardiology for follow up appointment. Call them (number in follow up section) if you don't hear from them in next few days. 2. Lasix can cause lower potassium so please take potassium supplement 20 meq daily as prescribed.  Discharge Diagnoses:  Principal Problem:   Shortness of breath Active Problems:   HYPOTHYROIDISM   HYPERLIPIDEMIA   GERD   Bronchitis   Chest tightness   Interstitial pneumonitis   Fibrosis of lung    Discharge Condition: medically stable for discharge home today  Diet recommendation: heart healthy diet  History of present illness:  79 y.o. female with a PMH of hyperlipidemia, hypothyroidism, history of ulcerative colitis who presented to the ED 03/14/2013 with dyspnea on exertion for past 10 days prior to this admission associated with cough and chest tightness for the same duration. Her symptoms were thought to be due to bronchitis and interstitial pneumonitis. She was admitted for further evaluation and workup.   Assessment/Plan:   Principal Problem:  Shortness of breath / bronchitis / chronic fibrosis / interstitial pneumonitis  - Patient underwent a CT angiogram of the chest which showed: No evidence of significant pulmonary embolus. Prominent pulmonary vascularity with interstitial changes likely representing vascular congestion and interstitial edema. Stable chronic fibrosis and mild mediastinal lymphadenopathy.  - Two-dimensional echocardiogram ordered for further evaluation, which showed grade 2 diastolic dysfunction and an EF of 55-65%. Pro BNP 386.  - cardiac enzymes negative - will have follow up appt with [place] cardiology - on lasix  40 mg a day which cardio agreed with instead of twice a day regimen - xanax for slight anxiety which pt reported helped very much  Active Problems:  HYPOTHYROIDISM  - Continue home dose of Synthroid. TSH 3.005.  HYPERLIPIDEMIA  - Continue Zetia.  GERD  - Continue PPI therapy.   Code Status: Full.  Family Communication: Husband updated at bedside.    SignedLeisa Lenz, MD  [facility] 03/15/2013, 11:30 AM  Pager #: [phone]  Procedures:  None   Consultations:  [facility], Trish to have them set up appt for the pt  Discharge Exam: Filed Vitals:   03/15/13 0646  BP: 121/66  Pulse: 87  Temp: 97.7 F (36.5 C)  Resp: 18   Filed Vitals:   03/14/13 1933 03/14/13 2202 03/15/13 0646 03/15/13 0903  BP:  114/55 121/66   Pulse:  93 87   Temp:  97.4 F (36.3 C) 97.7 F (36.5 C)   TempSrc:  Oral Oral   Resp:  18 18   Height:      Weight:      SpO2: 95% 97% 97% 96%    General: Pt is alert, follows commands appropriately, not in acute distress Cardiovascular: Regular rate and rhythm, S1/S2 +, no murmurs, no rubs, no gallops Respiratory: Clear to auscultation bilaterally, no wheezing, no crackles, no rhonchi Abdominal: Soft, non tender, non distended, bowel sounds +, no guarding Extremities: no edema, no cyanosis, pulses palpable bilaterally DP and PT Neuro: Grossly nonfocal  Discharge Instructions  Discharge Orders   Future Appointments Provider Department Dept Phone   03/28/2013 11:00 AM Dorothy Spark, MD [place] Office [phone]   Future Orders  Complete By Expires   Call MD for:  difficulty breathing, headache or visual disturbances  As directed    Call MD for:  persistant dizziness or light-headedness  As directed    Call MD for:  persistant nausea and vomiting  As directed    Call MD for:  severe uncontrolled pain  As directed    Diet - low sodium heart healthy  As directed    Discharge instructions  As  directed    Comments:     For diastolic heart failure we have started you on lasix 40 mg  A day. You will be called by [facility] cardiology for follow up appointment. Call them (number in follow up section) if you don't hear from them in next few days. 2. Lasix can cause lower potassium so please take potassium supplement 20 meq daily as prescribed.   Increase activity slowly  As directed        Medication List    STOP taking these medications       amoxicillin-clavulanate 875-125 MG per tablet  Commonly known as:  AUGMENTIN     cephALEXin 250 MG capsule  Commonly known as:  KEFLEX      TAKE these medications       ALPRAZolam 0.5 MG tablet  Commonly known as:  XANAX  Take 1 tablet (0.5 mg total) by mouth at bedtime as needed for anxiety.     aspirin EC 81 MG tablet  Take 81 mg by mouth 4 (four) times a week.     cholecalciferol 1000 UNITS tablet  Commonly known as:  VITAMIN D  Take 1,000 Units by mouth daily.     fluticasone 50 MCG/ACT nasal spray  Commonly known as:  FLONASE  Place 2 sprays into the nose daily as needed.     furosemide 40 MG tablet  Commonly known as:  LASIX  Take 1 tablet (40 mg total) by mouth daily.     levothyroxine 112 MCG tablet  Commonly known as:  SYNTHROID, LEVOTHROID  Take 112 mcg by mouth daily before breakfast.     mesalamine 0.375 G 24 hr capsule  Commonly known as:  APRISO  3 BY MOUTH ONCE DAILY     naproxen 250 MG tablet  Commonly known as:  NAPROSYN  Take 250 mg by mouth 2 (two) times daily as needed (pain).     omeprazole 20 MG capsule  Commonly known as:  PRILOSEC  Take 1 capsule (20 mg total) by mouth daily.     Potassium Chloride ER 20 MEQ Tbcr  Take 10 mEq by mouth daily.     SYSTANE BALANCE 0.6 % Soln  Generic drug:  Propylene Glycol  Apply 1 drop to eye daily as needed (dry eyes).     vitamin C 500 MG tablet  Commonly known as:  ASCORBIC ACID  Take 500 mg by mouth daily.     ZETIA 10 MG tablet  Generic drug:   ezetimibe  TAKE ONE TABLET BY MOUTH ONCE DAILY.           Follow-up Information   Schedule an appointment as soon as possible for a visit with [facility]). (they will call the pt for appointment)    Specialty:  Cardiology   Contact information:   [address] [place] [address] [phone]       The results of significant diagnostics from this hospitalization (including imaging, microbiology, ancillary and laboratory) are listed below for  reference.    Significant Diagnostic Studies: Dg Chest 2 View  03/13/2013   CLINICAL DATA:  Chest pain and shortness of breath  EXAM: CHEST  2 VIEW  COMPARISON:  01/09/2013  FINDINGS: Cardiac shadow is stable. Diffuse fibrotic changes are again identified throughout both lungs. No focal infiltrate or sizable effusion is seen. No acute bony abnormality is noted.  IMPRESSION: Chronic changes without acute abnormality.   Electronically Signed   By: Inez Catalina M.D.   On: 03/13/2013 20:39   Ct Angio Chest Pe W/cm &/or Wo Cm  03/13/2013   CLINICAL DATA:  Shortness of breath.  History of pulmonary embolus.  EXAM: CT ANGIOGRAPHY CHEST WITH CONTRAST  TECHNIQUE: Multidetector CT imaging of the chest was performed using the standard protocol during bolus administration of intravenous contrast. Multiplanar CT image reconstructions including MIPs were obtained to evaluate the vascular anatomy.  CONTRAST:  114m OMNIPAQUE IOHEXOL 350 MG/ML SOLN  COMPARISON:  12/09/2010  FINDINGS: Technically adequate study with good opacification of the central and segmental pulmonary arteries. No focal filling defects are demonstrated. No evidence of significant pulmonary embolus. Prominent pulmonary vascularity suggesting fluid overload or failure.  Normal heart size. There is lymphadenopathy in the mediastinum predominantly in the pretracheal and aortopulmonic window region. Lymph nodes appear stable in size since previous study.  Largest lymph node is measured at about 11 mm. The esophagus is decompressed. There is slight pleural thickening in the left costophrenic angle, new since previous study. No significant pleural effusions. Images of the upper abdominal organs demonstrate a cyst in the left lobe of the liver, stable since previous study.  There is diffuse peripheral infiltration in the lungs consistent with fibrosis. Since the previous study, there is increased interstitial pattern with thickening of the interlobular septa suggesting edema or interstitial pneumonitis. Patchy fibrotic infiltrative changes in the lung apices are stable since previous study. No pneumothorax. Airways appear patent. No destructive bone lesions are appreciated. The  Review of the MIP images confirms the above findings.  IMPRESSION: No evidence of significant pulmonary embolus. Prominent pulmonary vascularity with interstitial changes likely representing vascular congestion and interstitial edema. Stable chronic fibrosis and mild mediastinal lymphadenopathy.   Electronically Signed   By: WLucienne CapersM.D.   On: 03/13/2013 22:33    Microbiology: No results found for this or any previous visit (from the past 240 hour(s)).   Labs: Basic Metabolic Panel:  Recent Labs Lab 03/13/13 2010 03/14/13 0120  NA 132*  --   K 4.0  --   CL 99  --   CO2 23  --   GLUCOSE 121*  --   BUN 9  --   CREATININE 0.49* 0.52  CALCIUM 9.5  --    Liver Function Tests:  Recent Labs Lab 03/13/13 2010  AST 17  ALT 9  ALKPHOS 69  BILITOT 0.1*  PROT 6.2  ALBUMIN 3.3*   No results found for this basename: LIPASE, AMYLASE,  in the last 168 hours No results found for this basename: AMMONIA,  in the last 168 hours CBC:  Recent Labs Lab 03/13/13 2010 03/14/13 0120  WBC 7.0 7.4  NEUTROABS 3.5  --   HGB 11.7* 11.6*  HCT 35.0* 35.4*  MCV 92.1 91.9  PLT 275 271   Cardiac Enzymes:  Recent Labs Lab 03/13/13 2010 03/14/13 0905 03/14/13 1432  03/14/13 2043  TROPONINI <0.30 <0.30 <0.30 <0.30   BNP: BNP (last 3 results)  Recent Labs  03/13/13 2010  PROBNP 386.0  CBG: No results found for this basename: GLUCAP,  in the last 168 hours  Time coordinating discharge: Over 30 minutes

## 2013-03-21 DIAGNOSIS — J320 Chronic maxillary sinusitis: Secondary | ICD-10-CM | POA: Diagnosis not present

## 2013-03-21 DIAGNOSIS — Z9889 Other specified postprocedural states: Secondary | ICD-10-CM | POA: Diagnosis not present

## 2013-03-24 DIAGNOSIS — N952 Postmenopausal atrophic vaginitis: Secondary | ICD-10-CM | POA: Diagnosis not present

## 2013-03-28 ENCOUNTER — Ambulatory Visit (INDEPENDENT_AMBULATORY_CARE_PROVIDER_SITE_OTHER): Payer: Medicare Other | Admitting: Cardiology

## 2013-03-28 ENCOUNTER — Ambulatory Visit (HOSPITAL_COMMUNITY): Payer: Medicare Other | Attending: Cardiology

## 2013-03-28 ENCOUNTER — Encounter: Payer: Self-pay | Admitting: Cardiology

## 2013-03-28 VITALS — BP 124/70 | HR 90 | Ht 68.0 in | Wt 156.1 lb

## 2013-03-28 DIAGNOSIS — R0789 Other chest pain: Secondary | ICD-10-CM

## 2013-03-28 DIAGNOSIS — M79604 Pain in right leg: Secondary | ICD-10-CM

## 2013-03-28 DIAGNOSIS — M79609 Pain in unspecified limb: Secondary | ICD-10-CM

## 2013-03-28 DIAGNOSIS — M79605 Pain in left leg: Secondary | ICD-10-CM

## 2013-03-28 DIAGNOSIS — M7989 Other specified soft tissue disorders: Secondary | ICD-10-CM

## 2013-03-28 DIAGNOSIS — R0602 Shortness of breath: Secondary | ICD-10-CM

## 2013-03-28 DIAGNOSIS — I5031 Acute diastolic (congestive) heart failure: Secondary | ICD-10-CM | POA: Insufficient documentation

## 2013-03-28 LAB — BASIC METABOLIC PANEL
BUN: 8 mg/dL (ref 6–23)
CO2: 27 mEq/L (ref 19–32)
Calcium: 9.2 mg/dL (ref 8.4–10.5)
Chloride: 98 mEq/L (ref 96–112)
Creatinine, Ser: 0.6 mg/dL (ref 0.4–1.2)
GFR: 111.16 mL/min (ref >60.00)
Glucose, Bld: 106 mg/dL — ABNORMAL HIGH (ref 70–99)
Potassium: 3.6 mEq/L (ref 3.5–5.1)
Sodium: 134 mEq/L — ABNORMAL LOW (ref 135–145)

## 2013-03-28 NOTE — Addendum Note (Signed)
Addended by: Eulis Foster on: 03/28/2013 12:45 PM   Modules accepted: Orders

## 2013-03-28 NOTE — Progress Notes (Signed)
Patient ID: Cindy [name], female   DOB: [birth year], 79 y.o.   MRN: [id number]    Patient Name: Cindy [name] Date of Encounter: 03/28/2013  Primary Care Provider:  Alonza [name], [name] Primary Cardiologist:  Cindy [name], H  Problem List   Past Medical History  Diagnosis Date  . Osteoporosis, unspecified   . Edema   . Other and unspecified hyperlipidemia   . Unspecified hypothyroidism   . Diarrhea   . Other and unspecified noninfectious gastroenteritis and colitis(558.9)   . Family history of malignant neoplasm of gastrointestinal tract   . Flatulence, eructation, and gas pain   . GERD (gastroesophageal reflux disease)   . Stricture and stenosis of esophagus   . Esophagitis, unspecified   . Diverticulosis of colon (without mention of hemorrhage)   . Left sided ulcerative colitis   . URI (upper respiratory infection)   . Maxillary sinus mass   . Fibrosis of lung 03/14/2013   Past Surgical History  Procedure Laterality Date  . Abdominal hysterectomy    . Cholecystectomy    . Thyroidectomy, partial    . Foot surgery      Allergies  Allergies  Allergen Reactions  . Codeine Nausea Only  . Sulfonamide Derivatives Nausea Only    HPI  79 y.o. female with a PMH of hyperlipidemia, hypothyroidism, history of ulcerative colitis who was recently underwent a sinus surgery after which she developed symptoms of SOB and lower extremity edema.  Her symptoms were thought to be due to bronchitis and interstitial pneumonitis. She was admitted for further evaluation and workup. She was diagnosed with acute diastolic heart failure and started on oral Lasix with good response. The patient comes 2 weeks after the discharge she states that her shortness of breath has almost resolved, and her lower extremity edema has improved. She denies any orthopnea, paroxysmal nocturnal dyspnea dizziness or syncope. She states that she never had a chest pain, and she doesn't experiences significant  shortness of breath however she's not involved in any physical activity currently. The patient states that she has significant family history of coronary artery disease, her father died of myocardial infarction in his 81s but multiple brothers died of myocardial infarction one of them in at age of 42 and the others at their 51s.  Home Medications  Prior to Admission medications   Medication Sig Start Date End Date Taking? Authorizing Provider  ALPRAZolam Cindy [name]) 0.5 MG tablet Take 1 tablet (0.5 mg total) by mouth at bedtime as needed for anxiety. 03/15/13  Yes Cindy [name], [name]  aspirin EC 81 MG tablet Take 81 mg by mouth 4 (four) times a week.    Yes Historical Provider, [name]  cholecalciferol (VITAMIN D) 1000 UNITS tablet Take 1,000 Units by mouth daily.     Yes Historical Provider, [name]  fluticasone (FLONASE) 50 MCG/ACT nasal spray Place 2 sprays into the nose daily as needed. 12/17/12  Yes Historical Provider, [name]  furosemide (LASIX) 40 MG tablet Take 1 tablet (40 mg total) by mouth daily. 03/15/13  Yes Cindy [name], [name]  levothyroxine (SYNTHROID, LEVOTHROID) 112 MCG tablet Take 112 mcg by mouth daily before breakfast.   Yes Historical Provider, [name]  mesalamine (APRISO) 0.375 G 24 hr capsule 3 BY MOUTH ONCE DAILY 01/12/12  Yes Cindy [name], [name]  naproxen (NAPROSYN) 250 MG tablet Take 250 mg by mouth 2 (two) times daily as needed (pain).   Yes Historical Provider, [name]  omeprazole (PRILOSEC) 20 MG capsule Take 1  capsule (20 mg total) by mouth daily. 01/12/12  Yes Cindy [name], [name]  Potassium Chloride ER 20 MEQ TBCR Take 10 mEq by mouth daily. 03/15/13  Yes Cindy [name], [name]  Propylene Glycol (SYSTANE BALANCE) 0.6 % SOLN Apply 1 drop to eye daily as needed (dry eyes).    Yes Historical Provider, [name]  vitamin C (ASCORBIC ACID) 500 MG tablet Take 500 mg by mouth daily.   Yes Historical Provider, [name]  ZETIA 10 MG tablet TAKE ONE TABLET BY MOUTH ONCE DAILY. 10/22/12  Yes Cindy [name], [name]    Family  History  Family History  Problem Relation Age of Onset  . Colon cancer Mother   . Stomach cancer Sister   . Diabetes Brother   . Coronary artery disease Other   . Diabetes Other     Niece  . Diabetes Son     Social History  History   Social History  . Marital Status: Married    Spouse Name: N/A    Number of Children: N/A  . Years of Education: N/A   Occupational History  . Retired    Social History Main Topics  . Smoking status: Never Smoker   . Smokeless tobacco: Never Used  . Alcohol Use: No  . Drug Use: No  . Sexual Activity: Yes    Birth Control/ Protection: Surgical   Other Topics Concern  . Not on file   Social History Narrative  . No narrative on file     Review of Systems, as per HPI, otherwise negative General:  No chills, fever, night sweats or weight changes.  Cardiovascular:  No chest pain, dyspnea on exertion, edema, orthopnea, palpitations, paroxysmal nocturnal dyspnea. Dermatological: No rash, lesions/masses Respiratory: No cough, dyspnea Urologic: No hematuria, dysuria Abdominal:   No nausea, vomiting, diarrhea, bright red blood per rectum, melena, or hematemesis Neurologic:  No visual changes, wkns, changes in mental status. All other systems reviewed and are otherwise negative except as noted above.  Physical Exam  Blood pressure 124/70, pulse 90, height 5' 8"  (1.727 m), weight 156 lb 1.9 oz (70.816 kg).  General: Pleasant, NAD Psych: Normal affect. Neuro: Alert and oriented X 3. Moves all extremities spontaneously. HEENT: Normal  Neck: Supple without bruits or JVD. Lungs:  Resp regular and unlabored, CTA. Heart: RRR no s3, s4, or murmurs. Abdomen: Soft, non-tender, non-distended, BS + x 4.  Extremities: No clubbing, cyanosis, non-pitting edema up to the knees, calves tender to palpation B/L. DP/PT/Radials 2+ and equal bilaterally.  Labs:  No results found for this basename: CKTOTAL, CKMB, TROPONINI,  in the last 72 hours Lab  Results  Component Value Date   WBC 7.4 03/14/2013   HGB 11.6* 03/14/2013   HCT 35.4* 03/14/2013   MCV 91.9 03/14/2013   PLT 271 03/14/2013   No results found for this basename: NA, K, CL, CO2, BUN, CREATININE, CALCIUM, LABALBU, PROT, BILITOT, ALKPHOS, ALT, AST, GLUCOSE,  in the last 168 hours Lab Results  Component Value Date   CHOL 221* 07/15/2012   HDL 50 07/15/2012   LDLCALC 154* 07/15/2012   TRIG 86 07/15/2012   Lab Results  Component Value Date   DDIMER 1.43* 03/13/2013   No components found with this basename: POCBNP,   Accessory Clinical Findings  Echocardiogram 03/14/2013  - Left ventricle: The cavity size was normal. Wall thickness was increased in a pattern of mild LVH. Systolic function was normal. The estimated ejection fraction was in the range of 60% to  65%. Wall motion was normal; there were no regional wall motion abnormalities. Features are consistent with a pseudonormal left ventricular filling pattern, with concomitant abnormal relaxation and increased filling pressure (grade 2 diastolic dysfunction). - Aortic valve: There was no stenosis. - Mitral valve: Mild to moderate regurgitation. - Right ventricle: Poorly visualized. The cavity size was normal. Systolic function was normal. - Tricuspid valve: Peak RV-RA gradient: 59m Hg (S). - Pulmonary arteries: PA peak pressure: 332mHg (S). - Inferior vena cava: The vessel was normal in size; the respirophasic diameter changes were in the normal range (= 50%); findings are consistent with normal central venous pressure.  ECG - sinus rhythm, 75 beats per minute, negative T waves in V1 through V3 possible anterior ischemia.    Assessment & Plan  Principal Problem:   1. New diagnosis of CHF with preserved LV function, grade 2 diastolic dysfunction.  The patient is almost euvolemic, we will continue lasix 40 mg po daily and 10 mEq of KCl until the next visit, check BMP today  2. Abnormal ECG with possible  anterior ischemia, new dg of CHF, significant FH of CAD, we will order an exercise nuclear stress test   3. Hyperlipidemia - intolerance to statins, currently on Zetia, we will re-evaluate after the stress test, possibly refer to the lipid clinic  4. Hypertension - controlled  5. Lower extremity pain and swelling - order LE venous [place]/L  Follow up in 3 weeks  NEDorothy SparkMD, [facility]2/12/2012, 11:43 AM

## 2013-03-28 NOTE — Patient Instructions (Signed)
Your physician has requested that you have en exercise stress myoview. For further information please visit [email or web address]. Please follow instruction sheet, as given.  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 This test is an ultrasound of the veins in the legs It looks at venous blood flow that carries blood from the heart to the legs. Allow one hour for a Lower Venous exam. Allow thirty minutes for BILAT LOWER EXTRMITY VENOUS DOPPLER Venous exam. There are no restrictions or special instructions.  Your physician recommends that you return for lab work in: [place]

## 2013-04-03 ENCOUNTER — Telehealth: Payer: Self-pay | Admitting: Cardiology

## 2013-04-03 NOTE — Telephone Encounter (Signed)
Reviewed results with patient per her request; patient verbalized understanding.

## 2013-04-03 NOTE — Telephone Encounter (Signed)
New Problem:  Pt is requesting a call back with her test results.

## 2013-04-04 DIAGNOSIS — Z9889 Other specified postprocedural states: Secondary | ICD-10-CM | POA: Diagnosis not present

## 2013-04-04 DIAGNOSIS — J320 Chronic maxillary sinusitis: Secondary | ICD-10-CM | POA: Diagnosis not present

## 2013-04-18 ENCOUNTER — Encounter: Payer: Self-pay | Admitting: Cardiovascular Disease

## 2013-04-18 ENCOUNTER — Ambulatory Visit (HOSPITAL_COMMUNITY): Payer: Medicare Other | Attending: Cardiovascular Disease | Admitting: Radiology

## 2013-04-18 VITALS — BP 134/73 | HR 95 | Ht 68.0 in | Wt 158.0 lb

## 2013-04-18 DIAGNOSIS — I5030 Unspecified diastolic (congestive) heart failure: Secondary | ICD-10-CM | POA: Insufficient documentation

## 2013-04-18 DIAGNOSIS — R0789 Other chest pain: Secondary | ICD-10-CM

## 2013-04-18 DIAGNOSIS — R0602 Shortness of breath: Secondary | ICD-10-CM | POA: Diagnosis not present

## 2013-04-18 DIAGNOSIS — R079 Chest pain, unspecified: Secondary | ICD-10-CM | POA: Diagnosis not present

## 2013-04-18 DIAGNOSIS — Z8249 Family history of ischemic heart disease and other diseases of the circulatory system: Secondary | ICD-10-CM | POA: Insufficient documentation

## 2013-04-18 DIAGNOSIS — I10 Essential (primary) hypertension: Secondary | ICD-10-CM | POA: Diagnosis not present

## 2013-04-18 MED ORDER — TECHNETIUM TC 99M SESTAMIBI GENERIC - CARDIOLITE
11.0000 | Freq: Once | INTRAVENOUS | Status: AC | PRN
  Administered 2013-04-18: 11 via INTRAVENOUS

## 2013-04-18 MED ORDER — TECHNETIUM TC 99M SESTAMIBI GENERIC - CARDIOLITE
33.0000 | Freq: Once | INTRAVENOUS | Status: AC | PRN
  Administered 2013-04-18: 33 via INTRAVENOUS

## 2013-04-18 NOTE — Telephone Encounter (Signed)
Please call  Pt called states that she has no more lasix or potassium pills that were given to her because she has fluid around her heart// she states that now her ankles are swollen. She is calling for results of stress tests as well. She states she was advised that her refill is contingent upon the stress test results// please call back to discuss.

## 2013-04-18 NOTE — Telephone Encounter (Signed)
Spoke with patient who would like to know if Dr. Meda Coffee wants her to continue Lasix 40 mg QD and Potassium supplement 10 mEq QD.  Patient had Rx for these from hospital with no refills and finished those on either 12/25 or 12/26.  Patient states she thinks Dr. Meda Coffee wanted to review the echo before prescribing, so patient is calling to remind Dr. Meda Coffee to review echo and then make decision regarding these 2 meds.  Patient denies SOB, weight gain; states she has noted some bilateral ankle swelling.  I advised patient that I am sending message to Dr. Meda Coffee for advice and that someone from our office will call her with Dr. Francesca Oman advice.

## 2013-04-18 NOTE — Progress Notes (Signed)
[facility] [address] [place], [place] [address] [phone]    Cardiology Nuclear Med Study  Cindy [name] is a 79 y.o. female     MRN : [id number]     DOB: 1933/01/30  Procedure Date: 04/18/2013  Nuclear Med Background Indication for Stress Test:  Evaluation for Ischemia History:  Recently diagnosed w/ Diastolic HF; Echo 8648- EF= 60-65% Cardiac Risk Factors: Family History - CAD, Hypertension and Lipids  Symptoms:  Chest Tightness and SOB   Nuclear Pre-Procedure Caffeine/Decaff Intake:  None NPO After: 7:00pm   Lungs:  clear O2 Sat: 97% on room air. IV 0.9% NS with Angio Cath:  22g  IV Site: R Antecubital  IV Started by:  Mariann Laster Deal, RT-N  Chest Size (in):  36 Cup Size: C  Height: 5' 8"  (1.727 m)  Weight:  158 lb (71.668 kg)  BMI:  Body mass index is 24.03 kg/(m^2). Tech Comments:  N/A    Nuclear Med Study 1 or 2 day study: 1 day  Stress Test Type:  Stress  Reading MD: Jenkins Rouge, MD  Order Authorizing Provider:  K.Nelson, MD  Resting Radionuclide: Technetium 63mSestamibi  Resting Radionuclide Dose: 11.0 mCi   Stress Radionuclide:  Technetium 952mestamibi  Stress Radionuclide Dose: 33.0 mCi           Stress Protocol Rest HR: 95 Stress HR: 126  Rest BP: 134/73 Stress BP: 175/77  Exercise Time (min): 3:30 METS: 4.6           Dose of Adenosine (mg):  n/a Dose of Lexiscan: n/a mg  Dose of Atropine (mg): n/a Dose of Dobutamine: n/a mcg/kg/min (at max HR)  Stress Test Technologist: ShGlade LloydBS-ES  Nuclear Technologist:  StCharlton AmorCNMT     Rest Procedure:  Myocardial perfusion imaging was performed at rest 45 minutes following the intravenous administration of Technetium 9965mstamibi. Rest ECG: NSR - Normal EKG  Stress Procedure:  The patient exercised on the treadmill utilizing the Bruce Protocol for 3:30 minutes. The patient stopped due to being very SOB and denied any chest pain.  Technetium 14m76mtamibi was  injected at peak exercise and myocardial perfusion imaging was performed after a brief delay. Stress ECG: No significant change from baseline ECG  QPS Raw Data Images:  Normal; no motion artifact; normal heart/lung ratio. Stress Images:  Normal homogeneous uptake in all areas of the myocardium. Rest Images:  Normal homogeneous uptake in all areas of the myocardium. Subtraction (SDS):  Normal Transient Ischemic Dilatation (Normal <1.22):  1.03 Lung/Heart Ratio (Normal <0.45):  0.30  Quantitative Gated Spect Images QGS EDV:  54 ml QGS ESV:  8 ml  Impression Exercise Capacity:  Poor exercise capacity. BP Response:  Normal blood pressure response. Clinical Symptoms:  There is dyspnea. ECG Impression:  No significant ST segment change suggestive of ischemia. Comparison with Prior Nuclear Study: No images to compare  Overall Impression:  Normal stress nuclear study. Limited exercise due to dyspnea  LV Ejection Fraction: 84%.  LV Wall Motion:  NL LV Function; NL Wall Motion  PeteJenkins Rouge

## 2013-04-18 NOTE — Telephone Encounter (Signed)
Cheryl, Could you refill Lasix 40 mg PO daily and KCl 10 mEq daily and call this patient about it? Thank you, Houston Siren

## 2013-04-19 MED ORDER — POTASSIUM CHLORIDE ER 10 MEQ PO TBCR: 10.0000 meq | EXTENDED_RELEASE_TABLET | Freq: Every day | ORAL | Status: DC

## 2013-04-19 MED ORDER — FUROSEMIDE 40 MG PO TABS: 40.0000 mg | ORAL_TABLET | Freq: Every day | ORAL | Status: DC

## 2013-04-19 NOTE — Telephone Encounter (Signed)
Spoke to patient refills for Lasix 40 mg and Kdur 10 meq sent to pharmacy.Patient stated swelling in ankles better today.

## 2013-04-26 ENCOUNTER — Ambulatory Visit (INDEPENDENT_AMBULATORY_CARE_PROVIDER_SITE_OTHER): Payer: Medicare Other | Admitting: Cardiology

## 2013-04-26 ENCOUNTER — Telehealth: Payer: Self-pay | Admitting: Cardiology

## 2013-04-26 VITALS — BP 116/69 | HR 94 | Ht 68.0 in | Wt 157.0 lb

## 2013-04-26 DIAGNOSIS — I5031 Acute diastolic (congestive) heart failure: Secondary | ICD-10-CM | POA: Diagnosis not present

## 2013-04-26 DIAGNOSIS — R0789 Other chest pain: Secondary | ICD-10-CM

## 2013-04-26 NOTE — Progress Notes (Signed)
Patient ID: Cindy [name], female   DOB: 1933-04-14, 80 y.o.   MRN: [id number]    Patient Name: Cindy [name] Date of Encounter: 04/26/2013  Primary Care Provider:  [name], MD Primary Cardiologist:  Ena Dawley, H  Problem List   Past Medical History  Diagnosis Date  . Osteoporosis, unspecified   . Edema   . Other and unspecified hyperlipidemia   . Unspecified hypothyroidism   . Diarrhea   . Other and unspecified noninfectious gastroenteritis and colitis(558.9)   . Family history of malignant neoplasm of gastrointestinal tract   . Flatulence, eructation, and gas pain   . GERD (gastroesophageal reflux disease)   . Stricture and stenosis of esophagus   . Esophagitis, unspecified   . Diverticulosis of colon (without mention of hemorrhage)   . Left sided ulcerative colitis   . URI (upper respiratory infection)   . Maxillary sinus mass   . Fibrosis of lung 03/14/2013   Past Surgical History  Procedure Laterality Date  . Abdominal hysterectomy    . Cholecystectomy    . Thyroidectomy, partial    . Foot surgery      Allergies  Allergies  Allergen Reactions  . Codeine Nausea Only  . Sulfonamide Derivatives Nausea Only    HPI  80 y.o. female with a PMH of hyperlipidemia, hypothyroidism, history of ulcerative colitis who was recently underwent a sinus surgery after which she developed symptoms of SOB and lower extremity edema.  Her symptoms were thought to be due to bronchitis and interstitial pneumonitis. She was admitted for further evaluation and workup. She was diagnosed with acute diastolic heart failure and started on oral Lasix with good response. The patient comes 2 weeks after the discharge she states that her shortness of breath has almost resolved, and her lower extremity edema has improved. She denies any orthopnea, paroxysmal nocturnal dyspnea dizziness or syncope. She states that she never had a chest pain, and she doesn't experiences significant  shortness of breath however she's not involved in any physical activity currently. The patient states that she has significant family history of coronary artery disease, her father died of myocardial infarction in his 40s but multiple brothers died of myocardial infarction one of them in at age of 38 and the others at their 94s.  Home Medications  Prior to Admission medications   Medication Sig Start Date End Date Taking? Authorizing Provider  ALPRAZolam Duanne Moron) 0.5 MG tablet Take 1 tablet (0.5 mg total) by mouth at bedtime as needed for anxiety. 03/15/13  Yes Robbie Lis, MD  aspirin EC 81 MG tablet Take 81 mg by mouth 4 (four) times a week.    Yes Historical Provider, MD  cholecalciferol (VITAMIN D) 1000 UNITS tablet Take 1,000 Units by mouth daily.     Yes Historical Provider, MD  fluticasone (FLONASE) 50 MCG/ACT nasal spray Place 2 sprays into the nose daily as needed. 12/17/12  Yes Historical Provider, MD  furosemide (LASIX) 40 MG tablet Take 1 tablet (40 mg total) by mouth daily. 03/15/13  Yes Robbie Lis, MD  levothyroxine (SYNTHROID, LEVOTHROID) 112 MCG tablet Take 112 mcg by mouth daily before breakfast.   Yes Historical Provider, MD  mesalamine (APRISO) 0.375 G 24 hr capsule 3 BY MOUTH ONCE DAILY 01/12/12  Yes Inda Castle, MD  naproxen (NAPROSYN) 250 MG tablet Take 250 mg by mouth 2 (two) times daily as needed (pain).   Yes Historical Provider, MD  omeprazole (PRILOSEC) 20 MG capsule Take 1  capsule (20 mg total) by mouth daily. 01/12/12  Yes Inda Castle, MD  Potassium Chloride ER 20 MEQ TBCR Take 10 mEq by mouth daily. 03/15/13  Yes Robbie Lis, MD  Propylene Glycol (SYSTANE BALANCE) 0.6 % SOLN Apply 1 drop to eye daily as needed (dry eyes).    Yes Historical Provider, MD  vitamin C (ASCORBIC ACID) 500 MG tablet Take 500 mg by mouth daily.   Yes Historical Provider, MD  ZETIA 10 MG tablet TAKE ONE TABLET BY MOUTH ONCE DAILY. 10/22/12  Yes Florian Buff, MD    Family  History  Family History  Problem Relation Age of Onset  . Colon cancer Mother   . Stomach cancer Sister   . Diabetes Brother   . Coronary artery disease Other   . Diabetes Other     Niece  . Diabetes Son     Social History  History   Social History  . Marital Status: Married    Spouse Name: N/A    Number of Children: N/A  . Years of Education: N/A   Occupational History  . Retired    Social History Main Topics  . Smoking status: Never Smoker   . Smokeless tobacco: Never Used  . Alcohol Use: No  . Drug Use: No  . Sexual Activity: Yes    Birth Control/ Protection: Surgical   Other Topics Concern  . Not on file   Social History Narrative  . No narrative on file     Review of Systems, as per HPI, otherwise negative General:  No chills, fever, night sweats or weight changes.  Cardiovascular:  No chest pain, dyspnea on exertion, edema, orthopnea, palpitations, paroxysmal nocturnal dyspnea. Dermatological: No rash, lesions/masses Respiratory: No cough, dyspnea Urologic: No hematuria, dysuria Abdominal:   No nausea, vomiting, diarrhea, bright red blood per rectum, melena, or hematemesis Neurologic:  No visual changes, wkns, changes in mental status. All other systems reviewed and are otherwise negative except as noted above.  Physical Exam  Blood pressure 116/69, pulse 94, height 5' 8"  (1.727 m), weight 157 lb (71.215 kg).  General: Pleasant, NAD Psych: Normal affect. Neuro: Alert and oriented X 3. Moves all extremities spontaneously. HEENT: Normal  Neck: Supple without bruits or JVD. Lungs:  Resp regular and unlabored, CTA. Heart: RRR no s3, s4, or murmurs. Abdomen: Soft, non-tender, non-distended, BS + x 4.  Extremities: No clubbing, cyanosis, non-pitting edema up to the knees, calves tender to palpation B/L. DP/PT/Radials 2+ and equal bilaterally.  Labs:  No results found for this basename: CKTOTAL, CKMB, TROPONINI,  in the last 72 hours Lab Results   Component Value Date   WBC 7.4 03/14/2013   HGB 11.6* 03/14/2013   HCT 35.4* 03/14/2013   MCV 91.9 03/14/2013   PLT 271 03/14/2013   No results found for this basename: NA, K, CL, CO2, BUN, CREATININE, CALCIUM, LABALBU, PROT, BILITOT, ALKPHOS, ALT, AST, GLUCOSE,  in the last 168 hours Lab Results  Component Value Date   CHOL 221* 07/15/2012   HDL 50 07/15/2012   LDLCALC 154* 07/15/2012   TRIG 86 07/15/2012   Lab Results  Component Value Date   DDIMER 1.43* 03/13/2013   No components found with this basename: POCBNP,   Accessory Clinical Findings  Echocardiogram 03/14/2013  - Left ventricle: The cavity size was normal. Wall thickness was increased in a pattern of mild LVH. Systolic function was normal. The estimated ejection fraction was in the range of 60% to 65%. Wall  motion was normal; there were no regional wall motion abnormalities. Features are consistent with a pseudonormal left ventricular filling pattern, with concomitant abnormal relaxation and increased filling pressure (grade 2 diastolic dysfunction). - Aortic valve: There was no stenosis. - Mitral valve: Mild to moderate regurgitation. - Right ventricle: Poorly visualized. The cavity size was normal. Systolic function was normal. - Tricuspid valve: Peak RV-RA gradient: 10m Hg (S). - Pulmonary arteries: PA peak pressure: 32mHg (S). - Inferior vena cava: The vessel was normal in size; the respirophasic diameter changes were in the normal range (= 50%); findings are consistent with normal central venous pressure.  ECG - sinus rhythm, 75 beats per minute, negative T waves in V1 through V3 possible anterior ischemia.  Exercise nuclear stress test: 04/19/2013  Quantitative Gated Spect Images  QGS EDV: 54 ml  QGS ESV: 8 ml  Impression  Exercise Capacity: Poor exercise capacity.  BP Response: Normal blood pressure response.  Clinical Symptoms: There is dyspnea.  ECG Impression: No significant ST segment  change suggestive of ischemia.  Comparison with Prior Nuclear Study: No images to compare  Overall Impression: Normal stress nuclear study. Limited exercise due to dyspnea  LV Ejection Fraction: 84%. LV Wall Motion: NL LV Function; NL Wall Motion  PeJenkins Rouge Assessment & Plan  Principal Problem:   1. New diagnosis of CHF with preserved LV function, grade 2 diastolic dysfunction.  The patient is now euvolemic, we will continue lasix 40 mg po daily and 10 mEq of KCl.  We will check BMP in 2 weeks again.  [facility] 2. Abnormal ECG with possible anterior ischemia, new dg of CHF, significant FH of CAD, negative stress test with poor exercise capacity, cardiac rehab referral  3. Hyperlipidemia - intolerance to statins, currently on Zetia  4. Hypertension - controlled  5. Lower extremity pain and swelling - negative LE venous [place]/L  Follow up in 2 months   NEDorothy SparkMD, FAProvidence Medford Medical Center/10/2013, 9:38 AM

## 2013-04-26 NOTE — Telephone Encounter (Signed)
New message       Pt needs information about diastolicheart failure. Pt states dr Meda Coffee was going to give her that information.

## 2013-04-26 NOTE — Telephone Encounter (Signed)
**Note De-Identified  Obfuscation** Per pt request I mailed information concerning diastolic heart failure to the pt's address.

## 2013-04-26 NOTE — Patient Instructions (Addendum)
**Note De-Identified  Obfuscation** Your physician recommends that you continue on your current medications as directed. Please refer to the Current Medication list given to you today.  Your physician recommends that you return for lab work in: 2 weeks on 06/10/13    Your physician recommends that you schedule a follow-up appointment in: 2 months

## 2013-04-26 NOTE — Addendum Note (Signed)
**Note De-Identified Tinea Nobile Obfuscation** Addended by: Dennie Fetters on: 04/26/2013 10:33 AM   Modules accepted: Orders

## 2013-05-01 ENCOUNTER — Other Ambulatory Visit: Payer: Self-pay

## 2013-05-01 ENCOUNTER — Telehealth: Payer: Self-pay | Admitting: Cardiology

## 2013-05-01 DIAGNOSIS — I5031 Acute diastolic (congestive) heart failure: Secondary | ICD-10-CM

## 2013-05-01 NOTE — Telephone Encounter (Signed)
New message    Status of cardiac rehab referral & information.

## 2013-05-01 NOTE — Telephone Encounter (Signed)
**Note De-Identified  Obfuscation** Pt states that she was advised by Dr Meda Coffee at [facility] on 1/7 that she needs cardiac rehab. No orders have been placed. I placed a Amb referral to cardiac rehab at [facility]. Pt is advised that someone from [facility] cardiac rehab will contact her. She verbalized understanding.

## 2013-05-06 ENCOUNTER — Other Ambulatory Visit: Payer: Self-pay | Admitting: Gastroenterology

## 2013-05-08 NOTE — Telephone Encounter (Signed)
Needs an ov

## 2013-05-09 ENCOUNTER — Other Ambulatory Visit (INDEPENDENT_AMBULATORY_CARE_PROVIDER_SITE_OTHER): Payer: Medicare Other

## 2013-05-09 DIAGNOSIS — R0789 Other chest pain: Secondary | ICD-10-CM | POA: Diagnosis not present

## 2013-05-09 DIAGNOSIS — J320 Chronic maxillary sinusitis: Secondary | ICD-10-CM | POA: Diagnosis not present

## 2013-05-09 LAB — BASIC METABOLIC PANEL
BUN: 9 mg/dL (ref 6–23)
CO2: 28 mEq/L (ref 19–32)
Calcium: 9.2 mg/dL (ref 8.4–10.5)
Chloride: 97 mEq/L (ref 96–112)
Creatinine, Ser: 0.7 mg/dL (ref 0.4–1.2)
GFR: 90.35 mL/min (ref >60.00)
Glucose, Bld: 90 mg/dL (ref 70–99)
Potassium: 4.1 mEq/L (ref 3.5–5.1)
Sodium: 133 mEq/L — ABNORMAL LOW (ref 135–145)

## 2013-05-10 ENCOUNTER — Other Ambulatory Visit: Payer: Medicare Other

## 2013-05-18 ENCOUNTER — Encounter (HOSPITAL_COMMUNITY): Payer: Self-pay

## 2013-05-18 ENCOUNTER — Encounter (HOSPITAL_COMMUNITY)
Admission: RE | Admit: 2013-05-18 | Discharge: 2013-05-18 | Disposition: A | Discharge status: Home / Self Care | Payer: Medicare Other | Source: Ambulatory Visit | Attending: Cardiology | Admitting: Cardiology

## 2013-05-18 VITALS — BP 90/60 | HR 85 | Ht 68.0 in | Wt 158.7 lb

## 2013-05-18 DIAGNOSIS — I5031 Acute diastolic (congestive) heart failure: Secondary | ICD-10-CM

## 2013-05-18 HISTORY — DX: Heart failure, unspecified: I50.9

## 2013-05-18 NOTE — Progress Notes (Signed)
Patient has been referred to Cardiac Rehab by Dr. Ena Dawley due to Acute diastolic heart failure 240.31. During orientation advised patient on arrival and appointment times what to wear, what to do before, during and after exercise. Reviewed attendance and class policy. Talked about inclement weather and class consultation policy. Pt is scheduled to start Cardiac Rehab on 05/22/13 at 9:30. Pt was advised to come to class 5 minutes before class starts. He was also given instructions on meeting with the dietician and attending the Family Structure classes. Pt is eager to get started. Patient was able to do the 6 minute walk test.

## 2013-05-18 NOTE — Patient Instructions (Signed)
Pt has finished orientation and is scheduled to start CR on 05/22/13 at 9:30 am. Pt has been instructed to arrive to class 15 minutes early for scheduled class. Pt has been instructed to wear comfortable clothing and shoes with rubber soles. Pt has been told to take their medications 1 hour prior to coming to class.  If the patient is not going to attend class, he/she has been instructed to call.

## 2013-05-22 ENCOUNTER — Encounter (HOSPITAL_COMMUNITY)
Admission: RE | Admit: 2013-05-22 | Discharge: 2013-05-22 | Disposition: A | Discharge status: Home / Self Care | Payer: Medicare Other | Source: Ambulatory Visit | Attending: Cardiology | Admitting: Cardiology

## 2013-05-22 DIAGNOSIS — I5031 Acute diastolic (congestive) heart failure: Secondary | ICD-10-CM | POA: Insufficient documentation

## 2013-05-22 DIAGNOSIS — I509 Heart failure, unspecified: Secondary | ICD-10-CM | POA: Insufficient documentation

## 2013-05-22 DIAGNOSIS — Z5189 Encounter for other specified aftercare: Secondary | ICD-10-CM | POA: Insufficient documentation

## 2013-05-24 ENCOUNTER — Encounter (HOSPITAL_COMMUNITY)
Admission: RE | Admit: 2013-05-24 | Discharge: 2013-05-24 | Disposition: A | Discharge status: Home / Self Care | Payer: Medicare Other | Source: Ambulatory Visit | Attending: Cardiology | Admitting: Cardiology

## 2013-05-24 DIAGNOSIS — Z5189 Encounter for other specified aftercare: Secondary | ICD-10-CM | POA: Diagnosis not present

## 2013-05-26 ENCOUNTER — Encounter (HOSPITAL_COMMUNITY)
Admission: RE | Admit: 2013-05-26 | Discharge: 2013-05-26 | Disposition: A | Discharge status: Home / Self Care | Payer: Medicare Other | Source: Ambulatory Visit | Attending: Cardiology | Admitting: Cardiology

## 2013-05-26 DIAGNOSIS — Z5189 Encounter for other specified aftercare: Secondary | ICD-10-CM | POA: Diagnosis not present

## 2013-05-29 ENCOUNTER — Encounter (HOSPITAL_COMMUNITY)
Admission: RE | Admit: 2013-05-29 | Discharge: 2013-05-29 | Disposition: A | Discharge status: Home / Self Care | Payer: Medicare Other | Source: Ambulatory Visit | Attending: Cardiology | Admitting: Cardiology

## 2013-05-29 DIAGNOSIS — Z5189 Encounter for other specified aftercare: Secondary | ICD-10-CM | POA: Diagnosis not present

## 2013-05-31 ENCOUNTER — Encounter (HOSPITAL_COMMUNITY)
Admission: RE | Admit: 2013-05-31 | Discharge: 2013-05-31 | Disposition: A | Discharge status: Home / Self Care | Payer: Medicare Other | Source: Ambulatory Visit | Attending: Cardiology | Admitting: Cardiology

## 2013-05-31 DIAGNOSIS — Z5189 Encounter for other specified aftercare: Secondary | ICD-10-CM | POA: Diagnosis not present

## 2013-06-02 ENCOUNTER — Telehealth: Payer: Self-pay | Admitting: Cardiology

## 2013-06-02 ENCOUNTER — Encounter (HOSPITAL_COMMUNITY)
Admission: RE | Admit: 2013-06-02 | Discharge: 2013-06-02 | Disposition: A | Discharge status: Home / Self Care | Payer: Medicare Other | Source: Ambulatory Visit | Attending: Cardiology | Admitting: Cardiology

## 2013-06-02 DIAGNOSIS — I4891 Unspecified atrial fibrillation: Secondary | ICD-10-CM

## 2013-06-02 DIAGNOSIS — Z5189 Encounter for other specified aftercare: Secondary | ICD-10-CM | POA: Diagnosis not present

## 2013-06-02 NOTE — Telephone Encounter (Signed)
**Note De-Identified  Obfuscation** The pt is advised and she agrees with plan to wear holter monitor for 48 hours. Holter monitor ordered and a message has been sent to [facility] pool to call pt to schedule time to attach monitor. The pt verbalized understanding.

## 2013-06-02 NOTE — Telephone Encounter (Signed)
Can we start her on 48 hour holter to screen for a-fib? Thank you, Houston Siren

## 2013-06-02 NOTE — Telephone Encounter (Signed)
New message    C/o left fingers get numb at night. Some on the right hand . 118 heart rate while working around in the house.

## 2013-06-02 NOTE — Telephone Encounter (Signed)
**Note De-identified  Obfuscation** LMTCB

## 2013-06-02 NOTE — Telephone Encounter (Signed)
**Note De-Identified  Obfuscation** The pt states that her left hand and fingers and some fingers on her right hand gets numb during the night and are numb when she first gets up every morning and that her HR was 118 today while cooking. She is concerned and wants to know what she can do. Please advise.

## 2013-06-05 ENCOUNTER — Encounter (HOSPITAL_COMMUNITY)
Admission: RE | Admit: 2013-06-05 | Discharge: 2013-06-05 | Disposition: A | Discharge status: Home / Self Care | Payer: Medicare Other | Source: Ambulatory Visit | Attending: Cardiology | Admitting: Cardiology

## 2013-06-05 DIAGNOSIS — Z5189 Encounter for other specified aftercare: Secondary | ICD-10-CM | POA: Diagnosis not present

## 2013-06-07 ENCOUNTER — Encounter (HOSPITAL_COMMUNITY): Payer: Medicare Other

## 2013-06-09 ENCOUNTER — Encounter: Payer: Self-pay | Admitting: Radiology

## 2013-06-09 ENCOUNTER — Encounter (INDEPENDENT_AMBULATORY_CARE_PROVIDER_SITE_OTHER): Payer: Medicare Other

## 2013-06-09 ENCOUNTER — Encounter (HOSPITAL_COMMUNITY)
Admission: RE | Admit: 2013-06-09 | Discharge: 2013-06-09 | Disposition: A | Discharge status: Home / Self Care | Payer: Medicare Other | Source: Ambulatory Visit | Attending: Cardiology | Admitting: Cardiology

## 2013-06-09 DIAGNOSIS — Z5189 Encounter for other specified aftercare: Secondary | ICD-10-CM | POA: Diagnosis not present

## 2013-06-09 DIAGNOSIS — Z9889 Other specified postprocedural states: Secondary | ICD-10-CM | POA: Diagnosis not present

## 2013-06-09 DIAGNOSIS — I4891 Unspecified atrial fibrillation: Secondary | ICD-10-CM

## 2013-06-09 DIAGNOSIS — J328 Other chronic sinusitis: Secondary | ICD-10-CM | POA: Diagnosis not present

## 2013-06-09 NOTE — Progress Notes (Signed)
Patient ID: Cindy [name], female   DOB: Sep 05, 1932, 80 y.o.   MRN: [id number] Evo 48hr holter applied

## 2013-06-12 ENCOUNTER — Encounter (HOSPITAL_COMMUNITY)
Admission: RE | Admit: 2013-06-12 | Discharge: 2013-06-12 | Disposition: A | Discharge status: Home / Self Care | Payer: Medicare Other | Source: Ambulatory Visit | Attending: Cardiology | Admitting: Cardiology

## 2013-06-12 DIAGNOSIS — Z5189 Encounter for other specified aftercare: Secondary | ICD-10-CM | POA: Diagnosis not present

## 2013-06-13 NOTE — Progress Notes (Signed)
Cardiac Rehabilitation Program Outcomes Report   Orientation:  05/18/2013 1st week report:05/26/2013 Graduate Date:  tbd Discharge Date:  tbd # of sessions completed: 3 DX Acute diastolic Heart Failure  Cardiologist: Ottie Glazier Family MD:  Timmothy Euler Time:  09:30  A.  Exercise Program:  Tolerates exercise @ 3.74 METS for 15 minutes and Walk Test Results:  Pre: Pre Walk Test: Rest HR 85, BP 90/60, O2 98%, RPE 6 and RPD 6, 6 min HR 112, BP 118/72, BP 118/72, O2 90% RPE 9 and RPD 11, Post HR 84, BP 102/60 O2 100% , RPE 6 abd RPD 6. Walked 1245f at 2.3 mph at 2.7 MiETS.  B.  Mental Health:  Good mental attitude  C.  Education/Instruction/Skills  Accurately checks own pulse.  Rest:  103  Exercise:  116, Knows THR for exercise and Uses Perceived Exertion Scale and/or Dyspnea Scale  Uses Perceived Exertion Scale and/or Dyspnea Scale  D.  Nutrition/Weight Control/Body Composition:  Adherence to prescribed nutrition program: good    E.  Blood Lipids    Lab Results  Component Value Date   CHOL 221* 07/15/2012   HDL 50 07/15/2012   LDLCALC 154* 07/15/2012   TRIG 86 07/15/2012   CHOLHDL 4.4 07/15/2012    F.  Lifestyle Changes:  Making positive lifestyle changes and Not smoking:  Quit never smoked  G.  Symptoms noted with exercise:  Asymptomatic  Report Completed By:  NOletta Lamas Bryelle Spiewak RN   Comments:  This is patients 1st week report. She has done well in rehab so far. She achieved a peak METS of 3.74. Her resting HR was 103 and resting BP was 90/60. Her peak HR was 116 and her peak BP was 122/72. A halfway report will follow upon her 18 th visit.

## 2013-06-14 ENCOUNTER — Encounter (HOSPITAL_COMMUNITY)
Admission: RE | Admit: 2013-06-14 | Discharge: 2013-06-14 | Disposition: A | Discharge status: Home / Self Care | Payer: Medicare Other | Source: Ambulatory Visit | Attending: Cardiology | Admitting: Cardiology

## 2013-06-14 DIAGNOSIS — Z5189 Encounter for other specified aftercare: Secondary | ICD-10-CM | POA: Diagnosis not present

## 2013-06-16 ENCOUNTER — Encounter (HOSPITAL_COMMUNITY)
Admission: RE | Admit: 2013-06-16 | Discharge: 2013-06-16 | Disposition: A | Discharge status: Home / Self Care | Payer: Medicare Other | Source: Ambulatory Visit | Attending: Cardiology | Admitting: Cardiology

## 2013-06-16 ENCOUNTER — Telehealth: Payer: Self-pay | Admitting: Cardiology

## 2013-06-16 DIAGNOSIS — Z5189 Encounter for other specified aftercare: Secondary | ICD-10-CM | POA: Diagnosis not present

## 2013-06-16 NOTE — Telephone Encounter (Signed)
lmovm    Monitor was returned on 06/12/13. Dr. Meda Coffee is out of the office & we will call with the results of the monitor early next week Horton Chin RN

## 2013-06-16 NOTE — Telephone Encounter (Signed)
New Problem:   Pt is requesting a call back to hear her heart monitor results.

## 2013-06-19 ENCOUNTER — Encounter (HOSPITAL_COMMUNITY)
Admission: RE | Admit: 2013-06-19 | Discharge: 2013-06-19 | Disposition: A | Discharge status: Home / Self Care | Payer: Medicare Other | Source: Ambulatory Visit | Attending: Cardiology | Admitting: Cardiology

## 2013-06-19 DIAGNOSIS — I10 Essential (primary) hypertension: Secondary | ICD-10-CM | POA: Insufficient documentation

## 2013-06-19 DIAGNOSIS — I5032 Chronic diastolic (congestive) heart failure: Secondary | ICD-10-CM | POA: Diagnosis not present

## 2013-06-19 DIAGNOSIS — I509 Heart failure, unspecified: Secondary | ICD-10-CM | POA: Insufficient documentation

## 2013-06-19 DIAGNOSIS — E039 Hypothyroidism, unspecified: Secondary | ICD-10-CM | POA: Diagnosis not present

## 2013-06-19 DIAGNOSIS — Z5189 Encounter for other specified aftercare: Secondary | ICD-10-CM | POA: Diagnosis not present

## 2013-06-19 DIAGNOSIS — E785 Hyperlipidemia, unspecified: Secondary | ICD-10-CM | POA: Diagnosis not present

## 2013-06-20 NOTE — Telephone Encounter (Signed)
Follow up     Pt want results of her monitor

## 2013-06-20 NOTE — Telephone Encounter (Signed)
I spoke with Dr Nelson/ she will be in clinic tomorrow and will call pt back with monitor results. Pt was told she will be called 06/21/13, pt agreed to plan.

## 2013-06-21 ENCOUNTER — Encounter (HOSPITAL_COMMUNITY)
Admission: RE | Admit: 2013-06-21 | Discharge: 2013-06-21 | Disposition: A | Discharge status: Home / Self Care | Payer: Medicare Other | Source: Ambulatory Visit | Attending: Cardiology | Admitting: Cardiology

## 2013-06-21 ENCOUNTER — Telehealth: Payer: Self-pay | Admitting: Cardiology

## 2013-06-21 NOTE — Telephone Encounter (Signed)
**Note De-Identified  Obfuscation** Per Dr Meda Coffee the pt is advised of normal 48 hour holter results, she verbalized understanding.

## 2013-06-21 NOTE — Telephone Encounter (Signed)
Follow up    Returning call back to [facility]

## 2013-06-21 NOTE — Telephone Encounter (Signed)
**Note De-Identified  Obfuscation** LMTCB. Per Dr Meda Coffee the pts monitor results are normal.

## 2013-06-21 NOTE — Telephone Encounter (Signed)
New message     Returning a call to [employer]

## 2013-06-23 ENCOUNTER — Encounter (HOSPITAL_COMMUNITY): Payer: Medicare Other

## 2013-06-26 ENCOUNTER — Encounter: Payer: Self-pay | Admitting: Cardiology

## 2013-06-26 ENCOUNTER — Ambulatory Visit (INDEPENDENT_AMBULATORY_CARE_PROVIDER_SITE_OTHER): Payer: Medicare Other | Admitting: Cardiology

## 2013-06-26 ENCOUNTER — Telehealth: Payer: Self-pay | Admitting: Gastroenterology

## 2013-06-26 ENCOUNTER — Encounter (HOSPITAL_COMMUNITY): Payer: Medicare Other

## 2013-06-26 VITALS — BP 120/74 | HR 95 | Ht 68.0 in | Wt 155.0 lb

## 2013-06-26 DIAGNOSIS — E785 Hyperlipidemia, unspecified: Secondary | ICD-10-CM

## 2013-06-26 DIAGNOSIS — R0789 Other chest pain: Secondary | ICD-10-CM | POA: Diagnosis not present

## 2013-06-26 DIAGNOSIS — E039 Hypothyroidism, unspecified: Secondary | ICD-10-CM | POA: Diagnosis not present

## 2013-06-26 DIAGNOSIS — R000 Tachycardia, unspecified: Secondary | ICD-10-CM | POA: Insufficient documentation

## 2013-06-26 DIAGNOSIS — N952 Postmenopausal atrophic vaginitis: Secondary | ICD-10-CM | POA: Diagnosis not present

## 2013-06-26 LAB — COMPREHENSIVE METABOLIC PANEL
ALT: 10 U/L (ref 0–35)
AST: 22 U/L (ref 0–37)
Albumin: 4.1 g/dL (ref 3.5–5.2)
Alkaline Phosphatase: 89 U/L (ref 39–117)
BUN: 8 mg/dL (ref 6–23)
CO2: 29 mEq/L (ref 19–32)
Calcium: 9.1 mg/dL (ref 8.4–10.5)
Chloride: 96 mEq/L (ref 96–112)
Creatinine, Ser: 0.8 mg/dL (ref 0.4–1.2)
GFR: 76.92 mL/min (ref >60.00)
Glucose, Bld: 100 mg/dL — ABNORMAL HIGH (ref 70–99)
Potassium: 4.4 mEq/L (ref 3.5–5.1)
Sodium: 133 mEq/L — ABNORMAL LOW (ref 135–145)
Total Bilirubin: 0.5 mg/dL (ref 0.3–1.2)
Total Protein: 7.4 g/dL (ref 6.0–8.3)

## 2013-06-26 LAB — TSH: TSH: 2.6 u[IU]/mL (ref 0.35–5.50)

## 2013-06-26 MED ORDER — METOPROLOL SUCCINATE ER 25 MG PO TB24: 25.0000 mg | ORAL_TABLET | Freq: Every day | ORAL | Status: DC

## 2013-06-26 MED ORDER — FUROSEMIDE 20 MG PO TABS: 20.0000 mg | ORAL_TABLET | Freq: Every day | ORAL | Status: DC

## 2013-06-26 NOTE — Patient Instructions (Signed)
**Note De-Identified  Obfuscation** Your physician has recommended you make the following change in your medication: decrease Lasix to 20 mg daily and start taking Metoprolol XL 25 mg daily  Your physician recommends that you return for lab work in: today and one morning soon in [place]. Please do not eat or drink after midnight the night before labs are drawn.  Your physician wants you to follow-up in: 6 months. You will receive a reminder letter in the mail two months in advance. If you don't receive a letter, please call our office to schedule the follow-up appointment.

## 2013-06-26 NOTE — Progress Notes (Signed)
Patient ID: Cindy [name], female   DOB: [birth year], 80 y.o.   MRN: [id number]     Patient Name: Cindy [name] Date of Encounter: 06/26/2013  Primary Care Provider:  [name], MD Primary Cardiologist:  Dorothy Spark  Problem List   Past Medical History  Diagnosis Date  . Osteoporosis, unspecified   . Edema   . Other and unspecified hyperlipidemia   . Unspecified hypothyroidism   . Diarrhea   . Other and unspecified noninfectious gastroenteritis and colitis(558.9)   . Family history of malignant neoplasm of gastrointestinal tract   . Flatulence, eructation, and gas pain   . GERD (gastroesophageal reflux disease)   . Stricture and stenosis of esophagus   . Esophagitis, unspecified   . Diverticulosis of colon (without mention of hemorrhage)   . Left sided ulcerative colitis   . URI (upper respiratory infection)   . Maxillary sinus mass   . Fibrosis of lung 03/14/2013  . CHF (congestive heart failure)    Past Surgical History  Procedure Laterality Date  . Abdominal hysterectomy    . Cholecystectomy    . Thyroidectomy, partial    . Foot surgery      Allergies  Allergies  Allergen Reactions  . Codeine Nausea Only  . Sulfonamide Derivatives Nausea Only    HPI  80 y.o. female with a PMH of hyperlipidemia, hypothyroidism, history of ulcerative colitis who was recently underwent a sinus surgery after which she developed symptoms of SOB and lower extremity edema.  Her symptoms were thought to be due to bronchitis and interstitial pneumonitis. She was admitted for further evaluation and workup. She was diagnosed with acute diastolic heart failure and started on oral Lasix with good response. The patient comes 2 weeks after the discharge she states that her shortness of breath has almost resolved, and her lower extremity edema has improved. She denies any orthopnea, paroxysmal nocturnal dyspnea dizziness or syncope. She states that she never had a chest pain, and she  doesn't experiences significant shortness of breath however she's not involved in any physical activity currently. The patient states that she has significant family history of coronary artery disease, her father died of myocardial infarction in his 58s but multiple brothers died of myocardial infarction one of them in at age of 21 and the others at their 29s.  She is coming after 2 months,her energy level has increased but still feeling tired even after 2 months of rehab.   Home Medications  Prior to Admission medications   Medication Sig Start Date End Date Taking? Authorizing Provider  ALPRAZolam Duanne Moron) 0.5 MG tablet Take 1 tablet (0.5 mg total) by mouth at bedtime as needed for anxiety. 03/15/13  Yes Robbie Lis, MD  aspirin EC 81 MG tablet Take 81 mg by mouth 4 (four) times a week.    Yes Historical Provider, MD  cholecalciferol (VITAMIN D) 1000 UNITS tablet Take 1,000 Units by mouth daily.     Yes Historical Provider, MD  fluticasone (FLONASE) 50 MCG/ACT nasal spray Place 2 sprays into the nose daily as needed. 12/17/12  Yes Historical Provider, MD  furosemide (LASIX) 40 MG tablet Take 1 tablet (40 mg total) by mouth daily. 03/15/13  Yes Robbie Lis, MD  levothyroxine (SYNTHROID, LEVOTHROID) 112 MCG tablet Take 112 mcg by mouth daily before breakfast.   Yes Historical Provider, MD  mesalamine (APRISO) 0.375 G 24 hr capsule 3 BY MOUTH ONCE DAILY 01/12/12  Yes Inda Castle, MD  naproxen (  NAPROSYN) 250 MG tablet Take 250 mg by mouth 2 (two) times daily as needed (pain).   Yes Historical Provider, MD  omeprazole (PRILOSEC) 20 MG capsule Take 1 capsule (20 mg total) by mouth daily. 01/12/12  Yes Inda Castle, MD  Potassium Chloride ER 20 MEQ TBCR Take 10 mEq by mouth daily. 03/15/13  Yes Robbie Lis, MD  Propylene Glycol (SYSTANE BALANCE) 0.6 % SOLN Apply 1 drop to eye daily as needed (dry eyes).    Yes Historical Provider, MD  vitamin C (ASCORBIC ACID) 500 MG tablet Take 500 mg by  mouth daily.   Yes Historical Provider, MD  ZETIA 10 MG tablet TAKE ONE TABLET BY MOUTH ONCE DAILY. 10/22/12  Yes Florian Buff, MD    Family History  Family History  Problem Relation Age of Onset  . Colon cancer Mother   . Stomach cancer Sister   . Diabetes Brother   . Coronary artery disease Other   . Diabetes Other     Niece  . Diabetes Son     Social History  History   Social History  . Marital Status: Married    Spouse Name: N/A    Number of Children: N/A  . Years of Education: N/A   Occupational History  . Retired    Social History Main Topics  . Smoking status: Never Smoker   . Smokeless tobacco: Never Used  . Alcohol Use: No  . Drug Use: No  . Sexual Activity: Yes    Birth Control/ Protection: Surgical   Other Topics Concern  . Not on file   Social History Narrative  . No narrative on file     Review of Systems, as per HPI, otherwise negative General:  No chills, fever, night sweats or weight changes.  Cardiovascular:  No chest pain, dyspnea on exertion, edema, orthopnea, palpitations, paroxysmal nocturnal dyspnea. Dermatological: No rash, lesions/masses Respiratory: No cough, dyspnea Urologic: No hematuria, dysuria Abdominal:   No nausea, vomiting, diarrhea, bright red blood per rectum, melena, or hematemesis Neurologic:  No visual changes, wkns, changes in mental status. All other systems reviewed and are otherwise negative except as noted above.  Physical Exam  Blood pressure 120/74, pulse 95, height 5' 8"  (1.727 m), weight 155 lb (70.308 kg).  General: Pleasant, NAD Psych: Normal affect. Neuro: Alert and oriented X 3. Moves all extremities spontaneously. HEENT: Normal  Neck: Supple without bruits or JVD. Lungs:  Resp regular and unlabored, CTA. Heart: RRR no s3, s4, or murmurs. Abdomen: Soft, non-tender, non-distended, BS + x 4.  Extremities: No clubbing, cyanosis, non-pitting edema up to the knees, calves tender to palpation B/L.  DP/PT/Radials 2+ and equal bilaterally.  Labs:  No results found for this basename: CKTOTAL, CKMB, TROPONINI,  in the last 72 hours Lab Results  Component Value Date   WBC 7.4 03/14/2013   HGB 11.6* 03/14/2013   HCT 35.4* 03/14/2013   MCV 91.9 03/14/2013   PLT 271 03/14/2013   No results found for this basename: NA, K, CL, CO2, BUN, CREATININE, CALCIUM, LABALBU, PROT, BILITOT, ALKPHOS, ALT, AST, GLUCOSE,  in the last 168 hours Lab Results  Component Value Date   CHOL 221* 07/15/2012   HDL 50 07/15/2012   LDLCALC 154* 07/15/2012   TRIG 86 07/15/2012   Lab Results  Component Value Date   DDIMER 1.43* 03/13/2013   No components found with this basename: POCBNP,   Accessory Clinical Findings  Echocardiogram 03/14/2013  - Left ventricle: The cavity  size was normal. Wall thickness was increased in a pattern of mild LVH. Systolic function was normal. The estimated ejection fraction was in the range of 60% to 65%. Wall motion was normal; there were no regional wall motion abnormalities. Features are consistent with a pseudonormal left ventricular filling pattern, with concomitant abnormal relaxation and increased filling pressure (grade 2 diastolic dysfunction). - Aortic valve: There was no stenosis. - Mitral valve: Mild to moderate regurgitation. - Right ventricle: Poorly visualized. The cavity size was normal. Systolic function was normal. - Tricuspid valve: Peak RV-RA gradient: 87m Hg (S). - Pulmonary arteries: PA peak pressure: 362mHg (S). - Inferior vena cava: The vessel was normal in size; the respirophasic diameter changes were in the normal range (= 50%); findings are consistent with normal central venous pressure.  ECG - sinus rhythm, 75 beats per minute, negative T waves in V1 through V3 possible anterior ischemia.  Exercise nuclear stress test: 04/19/2013  Quantitative Gated Spect Images  QGS EDV: 54 ml  QGS ESV: 8 ml  Impression  Exercise Capacity: Poor  exercise capacity.  BP Response: Normal blood pressure response.  Clinical Symptoms: There is dyspnea.  ECG Impression: No significant ST segment change suggestive of ischemia.  Comparison with Prior Nuclear Study: No images to compare  Overall Impression: Normal stress nuclear study. Limited exercise due to dyspnea  LV Ejection Fraction: 84%. LV Wall Motion: NL LV Function; NL Wall Motion  PeJenkins [place] Assessment & Plan  Principal Problem:   1. New diagnosis of CHF with preserved LV function, grade 2 diastolic dysfunction.  The patient is now euvolemic, we will continue 10 mEq of KCl and cut lasix to 20 mg po daily. l check CMP today. [facility] 2. Abnormal ECG with possible anterior ischemia, new dg of CHF, significant FH of CAD, negative stress test with poor exercise capacity, continue rehab.  3. Hyperlipidemia - intolerance to statins, currently on Zetia, we will check now.  4. Hypertension - controlled  5. Lower extremity pain and swelling - negative LE venous [place]/L  6. Tachycardia - baseline and with exercise at rehab, we will start Toprol XL 25 mg po daily.   7. Hypothyroidism - check TSH today.  Follow up in 6 months.    NEDorothy SparkMD, FACentral Coast [facility]/12/2013, 9:53 AM

## 2013-06-26 NOTE — Telephone Encounter (Signed)
Patient picked up samples today  Made an appointment for follow up

## 2013-06-27 DIAGNOSIS — J209 Acute bronchitis, unspecified: Secondary | ICD-10-CM | POA: Diagnosis not present

## 2013-06-27 DIAGNOSIS — J111 Influenza due to unidentified influenza virus with other respiratory manifestations: Secondary | ICD-10-CM | POA: Diagnosis not present

## 2013-06-28 ENCOUNTER — Encounter (HOSPITAL_COMMUNITY): Payer: Medicare Other

## 2013-06-29 ENCOUNTER — Other Ambulatory Visit: Payer: Self-pay | Admitting: Cardiology

## 2013-06-29 DIAGNOSIS — E785 Hyperlipidemia, unspecified: Secondary | ICD-10-CM | POA: Diagnosis not present

## 2013-06-30 ENCOUNTER — Encounter (HOSPITAL_COMMUNITY)
Admission: RE | Admit: 2013-06-30 | Discharge: 2013-06-30 | Disposition: A | Discharge status: Home / Self Care | Payer: Medicare Other | Source: Ambulatory Visit | Attending: Cardiology | Admitting: Cardiology

## 2013-06-30 LAB — LIPID PANEL
Cholesterol: 200 mg/dL (ref 0–200)
HDL: 49 mg/dL (ref >39)
LDL Cholesterol: 140 mg/dL — ABNORMAL HIGH (ref 0–99)
Total CHOL/HDL Ratio: 4.1 Ratio
Triglycerides: 57 mg/dL (ref <150)
VLDL: 11 mg/dL (ref 0–40)

## 2013-07-03 ENCOUNTER — Encounter (HOSPITAL_COMMUNITY)
Admission: RE | Admit: 2013-07-03 | Discharge: 2013-07-03 | Disposition: A | Discharge status: Home / Self Care | Payer: Medicare Other | Source: Ambulatory Visit | Attending: Cardiology | Admitting: Cardiology

## 2013-07-05 ENCOUNTER — Encounter (HOSPITAL_COMMUNITY)
Admission: RE | Admit: 2013-07-05 | Discharge: 2013-07-05 | Disposition: A | Discharge status: Home / Self Care | Payer: Medicare Other | Source: Ambulatory Visit | Attending: Cardiology | Admitting: Cardiology

## 2013-07-07 ENCOUNTER — Encounter (HOSPITAL_COMMUNITY)
Admission: RE | Admit: 2013-07-07 | Discharge: 2013-07-07 | Disposition: A | Discharge status: Home / Self Care | Payer: Medicare Other | Source: Ambulatory Visit | Attending: Cardiology | Admitting: Cardiology

## 2013-07-10 ENCOUNTER — Encounter (HOSPITAL_COMMUNITY)
Admission: RE | Admit: 2013-07-10 | Discharge: 2013-07-10 | Disposition: A | Discharge status: Home / Self Care | Payer: Medicare Other | Source: Ambulatory Visit | Attending: Cardiology | Admitting: Cardiology

## 2013-07-12 ENCOUNTER — Encounter (HOSPITAL_COMMUNITY)
Admission: RE | Admit: 2013-07-12 | Discharge: 2013-07-12 | Disposition: A | Discharge status: Home / Self Care | Payer: Medicare Other | Source: Ambulatory Visit | Attending: Cardiology | Admitting: Cardiology

## 2013-07-13 ENCOUNTER — Other Ambulatory Visit: Payer: Self-pay | Admitting: *Deleted

## 2013-07-13 MED ORDER — METOPROLOL SUCCINATE ER 25 MG PO TB24: 25.0000 mg | ORAL_TABLET | Freq: Every day | ORAL | Status: DC

## 2013-07-14 ENCOUNTER — Encounter (HOSPITAL_COMMUNITY)
Admission: RE | Admit: 2013-07-14 | Discharge: 2013-07-14 | Disposition: A | Discharge status: Home / Self Care | Payer: Medicare Other | Source: Ambulatory Visit | Attending: Cardiology | Admitting: Cardiology

## 2013-07-14 NOTE — Progress Notes (Signed)
Cardiac Rehabilitation Program Outcomes Report   Orientation:  05/18/2013 Halfway Report; 07/10/2013 Graduate Date:  tbd Discharge Date:  tbd # of sessions completed: 18  Cardiologist: Ottie Glazier Family MD:  Timmothy Euler Time:  09:30  A.  Exercise Program:  Tolerates exercise @ 1.92 METS for 15 minutes  B.  Mental Health:  Good mental attitude  C.  Education/Instruction/Skills  Accurately checks own pulse.  Rest:  75  Exercise:90, Knows THR for exercise and Uses Perceived Exertion Scale and/or Dyspnea Scale  Uses Perceived Exertion Scale and/or Dyspnea Scale  D.  Nutrition/Weight Control/Body Composition:  Adherence to prescribed nutrition program: good    E.  Blood Lipids    Lab Results  Component Value Date   CHOL 200 06/29/2013   HDL 49 06/29/2013   LDLCALC 140* 06/29/2013   TRIG 57 06/29/2013   CHOLHDL 4.1 06/29/2013    F.  Lifestyle Changes:  Making positive lifestyle changes and Not smoking:  Quit never smoked  G.  Symptoms noted with exercise:  Asymptomatic  Report Completed By:  Oletta Lamas. Gerry Blanchfield RN   Comments:  This is patyients halfway report. She has completed 18 sessions. She achieved a peak METS of 1.92. Her resting HR was 75 and resting BP was 120/68. Her peak HR was 90 and peak BP was 124/70. A graduation report will follow upon the 36 th visit.

## 2013-07-17 ENCOUNTER — Other Ambulatory Visit: Payer: Self-pay | Admitting: Gastroenterology

## 2013-07-17 ENCOUNTER — Encounter (HOSPITAL_COMMUNITY)
Admission: RE | Admit: 2013-07-17 | Discharge: 2013-07-17 | Disposition: A | Discharge status: Home / Self Care | Payer: Medicare Other | Source: Ambulatory Visit | Attending: Cardiology | Admitting: Cardiology

## 2013-07-19 ENCOUNTER — Encounter (HOSPITAL_COMMUNITY)
Admission: RE | Admit: 2013-07-19 | Discharge: 2013-07-19 | Disposition: A | Discharge status: Home / Self Care | Payer: Medicare Other | Source: Ambulatory Visit | Attending: Cardiology | Admitting: Cardiology

## 2013-07-19 ENCOUNTER — Other Ambulatory Visit: Payer: Self-pay

## 2013-07-19 DIAGNOSIS — E785 Hyperlipidemia, unspecified: Secondary | ICD-10-CM | POA: Diagnosis not present

## 2013-07-19 DIAGNOSIS — E039 Hypothyroidism, unspecified: Secondary | ICD-10-CM | POA: Diagnosis not present

## 2013-07-19 DIAGNOSIS — Z5189 Encounter for other specified aftercare: Secondary | ICD-10-CM | POA: Diagnosis not present

## 2013-07-19 DIAGNOSIS — M810 Age-related osteoporosis without current pathological fracture: Secondary | ICD-10-CM | POA: Insufficient documentation

## 2013-07-19 DIAGNOSIS — I509 Heart failure, unspecified: Secondary | ICD-10-CM | POA: Diagnosis not present

## 2013-07-19 DIAGNOSIS — R0789 Other chest pain: Secondary | ICD-10-CM | POA: Insufficient documentation

## 2013-07-19 MED ORDER — POTASSIUM CHLORIDE ER 10 MEQ PO TBCR: 10.0000 meq | EXTENDED_RELEASE_TABLET | Freq: Every day | ORAL | Status: DC

## 2013-07-21 ENCOUNTER — Emergency Department (HOSPITAL_COMMUNITY)
Admission: EM | Admit: 2013-07-21 | Discharge: 2013-07-21 | Disposition: A | Discharge status: Home / Self Care | Payer: Medicare Other | Attending: Emergency Medicine | Admitting: Emergency Medicine

## 2013-07-21 ENCOUNTER — Encounter (HOSPITAL_COMMUNITY): Payer: Medicare Other

## 2013-07-21 ENCOUNTER — Encounter (HOSPITAL_COMMUNITY): Payer: Self-pay | Admitting: Emergency Medicine

## 2013-07-21 ENCOUNTER — Emergency Department (HOSPITAL_COMMUNITY): Payer: Medicare Other

## 2013-07-21 DIAGNOSIS — Y929 Unspecified place or not applicable: Secondary | ICD-10-CM | POA: Insufficient documentation

## 2013-07-21 DIAGNOSIS — S8001XA Contusion of right knee, initial encounter: Secondary | ICD-10-CM

## 2013-07-21 DIAGNOSIS — W010XXA Fall on same level from slipping, tripping and stumbling without subsequent striking against object, initial encounter: Secondary | ICD-10-CM | POA: Insufficient documentation

## 2013-07-21 DIAGNOSIS — S8990XA Unspecified injury of unspecified lower leg, initial encounter: Secondary | ICD-10-CM | POA: Diagnosis not present

## 2013-07-21 DIAGNOSIS — S8000XA Contusion of unspecified knee, initial encounter: Secondary | ICD-10-CM | POA: Diagnosis not present

## 2013-07-21 DIAGNOSIS — Z79899 Other long term (current) drug therapy: Secondary | ICD-10-CM | POA: Insufficient documentation

## 2013-07-21 DIAGNOSIS — E039 Hypothyroidism, unspecified: Secondary | ICD-10-CM | POA: Insufficient documentation

## 2013-07-21 DIAGNOSIS — R079 Chest pain, unspecified: Secondary | ICD-10-CM | POA: Diagnosis not present

## 2013-07-21 DIAGNOSIS — Z7982 Long term (current) use of aspirin: Secondary | ICD-10-CM | POA: Insufficient documentation

## 2013-07-21 DIAGNOSIS — S298XXA Other specified injuries of thorax, initial encounter: Secondary | ICD-10-CM | POA: Insufficient documentation

## 2013-07-21 DIAGNOSIS — Z8709 Personal history of other diseases of the respiratory system: Secondary | ICD-10-CM | POA: Diagnosis not present

## 2013-07-21 DIAGNOSIS — I509 Heart failure, unspecified: Secondary | ICD-10-CM | POA: Insufficient documentation

## 2013-07-21 DIAGNOSIS — Z8739 Personal history of other diseases of the musculoskeletal system and connective tissue: Secondary | ICD-10-CM | POA: Diagnosis not present

## 2013-07-21 DIAGNOSIS — E785 Hyperlipidemia, unspecified: Secondary | ICD-10-CM | POA: Insufficient documentation

## 2013-07-21 DIAGNOSIS — Y9301 Activity, walking, marching and hiking: Secondary | ICD-10-CM | POA: Insufficient documentation

## 2013-07-21 DIAGNOSIS — R0789 Other chest pain: Secondary | ICD-10-CM

## 2013-07-21 DIAGNOSIS — R071 Chest pain on breathing: Secondary | ICD-10-CM | POA: Diagnosis not present

## 2013-07-21 DIAGNOSIS — K219 Gastro-esophageal reflux disease without esophagitis: Secondary | ICD-10-CM | POA: Insufficient documentation

## 2013-07-21 DIAGNOSIS — S99919A Unspecified injury of unspecified ankle, initial encounter: Secondary | ICD-10-CM | POA: Diagnosis not present

## 2013-07-21 DIAGNOSIS — M25569 Pain in unspecified knee: Secondary | ICD-10-CM | POA: Diagnosis not present

## 2013-07-21 MED ORDER — HYDROCODONE-ACETAMINOPHEN 5-325 MG PO TABS: 1.0000 | ORAL_TABLET | ORAL | Status: DC | PRN

## 2013-07-21 NOTE — ED Notes (Signed)
Pt tripped on cement yesterday and c/o pain to bilateral knees right worse than left knee, denies hitting her head, denies LOC, also c/o pain across chest from fall, bruising and swelling noted to right knee

## 2013-07-21 NOTE — ED Notes (Signed)
Olga Millers, PA at bedside.

## 2013-07-21 NOTE — Discharge Instructions (Signed)
Chest Wall Pain Chest wall pain is pain felt in or around the chest bones and muscles. It may take up to 6 weeks to get better. It may take longer if you are active. Chest wall pain can happen on its own. Other times, things like germs, injury, coughing, or exercise can cause the pain. HOME CARE   Avoid activities that make you tired or cause pain. Try not to use your chest, belly (abdominal), or side muscles. Do not use heavy weights.  Put ice on the sore area.  Put ice in a plastic bag.  Place a towel between your skin and the bag.  Leave the ice on for 15-20 minutes for the first 2 days.  Only take medicine as told by your doctor. GET HELP RIGHT AWAY IF:   You have more pain or are very uncomfortable.  You have a fever.  Your chest pain gets worse.  You have new problems.  You feel sick to your stomach (nauseous) or throw up (vomit).  You start to sweat or feel lightheaded.  You have a cough with mucus (phlegm).  You cough up blood. MAKE SURE YOU:   Understand these instructions.  Will watch your condition.  Will get help right away if you are not doing well or get worse. Document Released: 09/23/2007 Document Revised: 06/29/2011 Document Reviewed: 12/01/2010 [facility] Patient Information 2014 [place], [place].  Contusion A contusion is a deep bruise. Contusions happen when an injury causes bleeding under the skin. Signs of bruising include pain, puffiness (swelling), and discolored skin. The contusion may turn blue, purple, or yellow. HOME CARE   Put ice on the injured area.  Put ice in a plastic bag.  Place a towel between your skin and the bag.  Leave the ice on for 15-20 minutes, 03-04 times a day.  Only take medicine as told by your doctor.  Rest the injured area.  If possible, raise (elevate) the injured area to lessen puffiness. GET HELP RIGHT AWAY IF:   You have more bruising or puffiness.  You have pain that is getting worse.  Your puffiness  or pain is not helped by medicine. MAKE SURE YOU:   Understand these instructions.  Will watch your condition.  Will get help right away if you are not doing well or get worse. Document Released: 09/23/2007 Document Revised: 06/29/2011 Document Reviewed: 02/09/2011 [facility] Patient Information 2014 [place], [place].   Apply a heating pad to your your chest wall for 20 minutes several times daily.  You may sue the medicine prescribed for pain relief, but use caution with this medicine as it will make you sleepy - do not drive within 4 hours of taking.  Your xrays are negative today for any bony injuries (no broken ribs).

## 2013-07-22 NOTE — ED Provider Notes (Signed)
CSN: [id number]     Arrival date & time 07/21/13  1045 History   First MD Initiated Contact with Patient 07/21/13 1106     Chief Complaint  Patient presents with  . Fall     (Consider location/radiation/quality/duration/timing/severity/associated sxs/prior Treatment) HPI Comments: Cindy [name] is a 80 y.o. Female presenting for evaluation of pain along her left chest wall and her right knee after tripping while walking on cement yesterday,  Falling forward with her chest against a curb edge,and landing directly on her right knee and indirectly on the left knee.  Her pain is described as aching, the knee is worsened by palpation and movement and weight bearing although is able to bear weight.  Chest pain is worsened with palpation, twisting of her torso and with deep inspiration.  She denies shortness of breath and did not hit her head during the fall. She also denies dizziness or lightheadedness prior to the incident.  She denies neck and back pain.  She does take an 81 mg asa daily, no other blood thinners. She has applied an ice pack to her knee which has given some relief.  She has taken no other home pain relievers.      The history is provided by the patient.    Past Medical History  Diagnosis Date  . Osteoporosis, unspecified   . Edema   . Other and unspecified hyperlipidemia   . Unspecified hypothyroidism   . Diarrhea   . Other and unspecified noninfectious gastroenteritis and colitis(558.9)   . Family history of malignant neoplasm of gastrointestinal tract   . Flatulence, eructation, and gas pain   . GERD (gastroesophageal reflux disease)   . Stricture and stenosis of esophagus   . Esophagitis, unspecified   . Diverticulosis of colon (without mention of hemorrhage)   . Left sided ulcerative colitis   . URI (upper respiratory infection)   . Maxillary sinus mass   . Fibrosis of lung 03/14/2013  . CHF (congestive heart failure)    Past Surgical History  Procedure  Laterality Date  . Abdominal hysterectomy    . Cholecystectomy    . Thyroidectomy, partial    . Foot surgery     Family History  Problem Relation Age of Onset  . Colon cancer Mother   . Stomach cancer Sister   . Diabetes Brother   . Coronary artery disease Other   . Diabetes Other     Niece  . Diabetes Son    History  Substance Use Topics  . Smoking status: Never Smoker   . Smokeless tobacco: Never Used  . Alcohol Use: No   OB History   Grav Para Term Preterm Abortions TAB SAB Ect Mult Living   3 2 2  1  1         Review of Systems  Constitutional: Negative for fever and chills.  Respiratory: Negative for shortness of breath.   Cardiovascular: Positive for chest pain.  Musculoskeletal: Positive for arthralgias and joint swelling. Negative for back pain, myalgias and neck pain.  Skin: Positive for color change. Negative for wound.  Neurological: Negative for dizziness, weakness, light-headedness, numbness and headaches.      Allergies  Codeine and Sulfonamide derivatives  Home Medications   Current Outpatient Rx  Name  Route  Sig  Dispense  Refill  . ALPRAZolam (XANAX) 0.5 MG tablet   Oral   Take 1 tablet (0.5 mg total) by mouth at bedtime as needed for anxiety.   30 tablet  3   . aspirin EC 81 MG tablet   Oral   Take 81 mg by mouth 4 (four) times a week.          . calcium carbonate (OS-CAL) 600 MG TABS tablet   Oral   Take 600 mg by mouth daily with breakfast.         . cholecalciferol (VITAMIN D) 1000 UNITS tablet   Oral   Take 1,000 Units by mouth daily.           Marland Kitchen ezetimibe (ZETIA) 10 MG tablet      TAKE ONE TABLET BY MOUTH at bedtime         . fluticasone (FLONASE) 50 MCG/ACT nasal spray   Nasal   Place 2 sprays into the nose daily as needed.         . furosemide (LASIX) 20 MG tablet   Oral   Take 1 tablet (20 mg total) by mouth daily.   90 tablet   3     Decrease in dose   . levothyroxine (SYNTHROID, LEVOTHROID) 112 MCG  tablet   Oral   Take 112 mcg by mouth daily before breakfast.         . mesalamine (APRISO) 0.375 G 24 hr capsule      Take 4 capsules by mouth  at bedtime         . metoprolol succinate (TOPROL-XL) 25 MG 24 hr tablet   Oral   Take 1 tablet (25 mg total) by mouth daily.   90 tablet   1   . omeprazole (PRILOSEC) 20 MG capsule   Oral   Take 1 capsule (20 mg total) by mouth daily.   30 capsule   0     Samples given Lot # [id number]  Exp:  4/15      2 ...   . potassium chloride (K-DUR) 10 MEQ tablet   Oral   Take 1 tablet (10 mEq total) by mouth daily.   90 tablet   1   . Propylene Glycol (SYSTANE BALANCE) 0.6 % SOLN   Ophthalmic   Apply 1 drop to eye daily as needed (dry eyes).          . vitamin B-12 (CYANOCOBALAMIN) 1000 MCG tablet   Oral   Take 1,000 mcg by mouth daily.         Marland Kitchen ESTRING 2 MG vaginal ring               . HYDROcodone-acetaminophen (NORCO/VICODIN) 5-325 MG per tablet   Oral   Take 1 tablet by mouth every 4 (four) hours as needed for moderate pain.   15 tablet   0    BP 92/55  Pulse 73  Temp(Src) 97.9 F (36.6 C) (Oral)  Resp 18  Ht 5' 8"  (1.727 m)  Wt 150 lb (68.04 kg)  BMI 22.81 kg/m2  SpO2 99% Physical Exam  Constitutional: She appears well-developed and well-nourished.  HENT:  Head: Atraumatic.  Eyes: EOM are normal. Pupils are equal, round, and reactive to light.  Neck: Normal range of motion. Neck supple. No spinous process tenderness and no muscular tenderness present.  Cardiovascular: Normal rate and regular rhythm.   Pulses equal bilaterally  Pulmonary/Chest: Breath sounds normal. No respiratory distress. She has no wheezes. She has no rhonchi. She has no rales.  ttp along left chest wall above breast at midclavicular line.  No crepitus, no ecchymosis.  Musculoskeletal: She exhibits tenderness.  Right knee: She exhibits swelling, ecchymosis and bony tenderness. She exhibits no deformity, no laceration, no  erythema, normal alignment, no LCL laxity and no MCL laxity.  ttp right patella.  No deformity.  Passive ROM beyond 90 degrees, mild discomfort, no crepitus. No effusion.   Left knee nontender, mild ecchymosis noted over patella.    Neurological: She is alert. She has normal strength. She displays normal reflexes. No sensory deficit.  Skin: Skin is warm and dry.  Psychiatric: She has a normal mood and affect.    ED Course  Procedures (including critical care time) Labs Review Labs Reviewed - No data to display Imaging Review Dg Ribs Unilateral W/chest Left  07/21/2013   CLINICAL DATA:  Left-sided pain post fall  EXAM: LEFT RIBS AND CHEST - 3+ VIEW  COMPARISON:  CT 03/13/2013  FINDINGS: Some increase in interstitial prominence primarily in the lung bases. Lungs mildly hyperinflated. No pneumothorax or effusion. Detailed views show no displaced fracture or other focal lesion. . Atheromatous aorta.  IMPRESSION: 1. Negative for displaced fracture, pneumothorax, or other acute abnormality. 2. Interval progression of interstitial opacities in both lung bases.   Electronically Signed   By: Arne Cleveland M.D.   On: 07/21/2013 12:16   Dg Knee Complete 4 Views Right  07/21/2013   CLINICAL DATA:  Pain post fall  EXAM: RIGHT KNEE - COMPLETE 4+ VIEW  COMPARISON:  02/09/2013  FINDINGS: Small marginal spurs about the lateral compartment. Normal alignment. Negative for fracture, dislocation, or other acute bone abnormality. No effusion. Normal mineralization.  IMPRESSION: 1. Negative for fracture or other acute abnormality. 2. Small lateral compartment spurs.   Electronically Signed   By: Arne Cleveland M.D.   On: 07/21/2013 12:14     EKG Interpretation None      MDM   Final diagnoses:  Contusion of knee, right  Chest wall pain    Patients labs and/or radiological studies were viewed and considered during the medical decision making and disposition process. No bony injury per xrays.  Pt was advised  RICE for knee for the  Next 2 days, may add heat tx starting on day including left chest wall.  She was prescribed hydrocodone, which she endorses has taken in the past and tolerated well,  Cautioned regarding drowsiness,  Suggested 1/2 to 1 tab dosing. F/u with pcp if sx persist beyond the next 7-10 days.   Husband at bedside,  Both he and wife agree with plan.    Pt was seen by Dr. Jeneen Rinks prior to dc home.    Evalee Jefferson, PA-C 07/22/13 1519

## 2013-07-24 ENCOUNTER — Encounter (HOSPITAL_COMMUNITY): Payer: Medicare Other

## 2013-07-26 ENCOUNTER — Telehealth: Payer: Self-pay | Admitting: Gastroenterology

## 2013-07-26 ENCOUNTER — Encounter (HOSPITAL_COMMUNITY)
Admission: RE | Admit: 2013-07-26 | Discharge: 2013-07-26 | Disposition: A | Discharge status: Home / Self Care | Payer: Medicare Other | Source: Ambulatory Visit | Attending: Cardiology | Admitting: Cardiology

## 2013-07-26 DIAGNOSIS — Z5189 Encounter for other specified aftercare: Secondary | ICD-10-CM | POA: Diagnosis not present

## 2013-07-27 MED ORDER — MESALAMINE ER 0.375 G PO CP24: ORAL_CAPSULE | ORAL | Status: DC

## 2013-07-27 NOTE — Telephone Encounter (Signed)
Patient to pick up samples enough to last till her appointment

## 2013-07-28 ENCOUNTER — Encounter (HOSPITAL_COMMUNITY)
Admission: RE | Admit: 2013-07-28 | Discharge: 2013-07-28 | Disposition: A | Discharge status: Home / Self Care | Payer: Medicare Other | Source: Ambulatory Visit | Attending: Cardiology | Admitting: Cardiology

## 2013-07-28 DIAGNOSIS — Z5189 Encounter for other specified aftercare: Secondary | ICD-10-CM | POA: Diagnosis not present

## 2013-07-31 ENCOUNTER — Encounter (HOSPITAL_COMMUNITY)
Admission: RE | Admit: 2013-07-31 | Discharge: 2013-07-31 | Disposition: A | Discharge status: Home / Self Care | Payer: Medicare Other | Source: Ambulatory Visit | Attending: Cardiology | Admitting: Cardiology

## 2013-07-31 DIAGNOSIS — Z5189 Encounter for other specified aftercare: Secondary | ICD-10-CM | POA: Diagnosis not present

## 2013-07-31 NOTE — ED Provider Notes (Signed)
Medical screening examination/treatment/procedure(s) were performed by non-physician practitioner and as supervising physician I was immediately available for consultation/collaboration.   EKG Interpretation None        Tanna Furry, MD 07/31/13 2358

## 2013-08-02 ENCOUNTER — Encounter (HOSPITAL_COMMUNITY)
Admission: RE | Admit: 2013-08-02 | Discharge: 2013-08-02 | Disposition: A | Discharge status: Home / Self Care | Payer: Medicare Other | Source: Ambulatory Visit | Attending: Cardiology | Admitting: Cardiology

## 2013-08-02 DIAGNOSIS — Z5189 Encounter for other specified aftercare: Secondary | ICD-10-CM | POA: Diagnosis not present

## 2013-08-04 ENCOUNTER — Encounter (HOSPITAL_COMMUNITY)
Admission: RE | Admit: 2013-08-04 | Discharge: 2013-08-04 | Disposition: A | Discharge status: Home / Self Care | Payer: Medicare Other | Source: Ambulatory Visit | Attending: Cardiology | Admitting: Cardiology

## 2013-08-04 DIAGNOSIS — Z5189 Encounter for other specified aftercare: Secondary | ICD-10-CM | POA: Diagnosis not present

## 2013-08-07 ENCOUNTER — Encounter (HOSPITAL_COMMUNITY)
Admission: RE | Admit: 2013-08-07 | Discharge: 2013-08-07 | Disposition: A | Discharge status: Home / Self Care | Payer: Medicare Other | Source: Ambulatory Visit | Attending: Cardiology | Admitting: Cardiology

## 2013-08-07 DIAGNOSIS — Z5189 Encounter for other specified aftercare: Secondary | ICD-10-CM | POA: Diagnosis not present

## 2013-08-08 ENCOUNTER — Encounter: Payer: Self-pay | Admitting: Gastroenterology

## 2013-08-08 ENCOUNTER — Ambulatory Visit (INDEPENDENT_AMBULATORY_CARE_PROVIDER_SITE_OTHER): Payer: Medicare Other | Admitting: Gastroenterology

## 2013-08-08 VITALS — BP 122/80 | HR 76 | Ht 68.0 in | Wt 153.0 lb

## 2013-08-08 DIAGNOSIS — Z800 Family history of malignant neoplasm of digestive organs: Secondary | ICD-10-CM

## 2013-08-08 DIAGNOSIS — K5150 Left sided colitis without complications: Secondary | ICD-10-CM

## 2013-08-08 MED ORDER — MESALAMINE ER 0.375 G PO CP24: ORAL_CAPSULE | ORAL | Status: DC

## 2013-08-08 NOTE — Addendum Note (Signed)
Addended by: Oda Kilts on: 08/08/2013 09:31 AM   Modules accepted: Orders

## 2013-08-08 NOTE — Patient Instructions (Addendum)
Follow up in one year Apriso 90 day sent to [facility]

## 2013-08-08 NOTE — Assessment & Plan Note (Signed)
Patient remains in clinical remission on mesalamine (either lialda or apriso)  Recommendations #1 2 consider followup colonoscopy in one year with which will be 5 years from her last exam

## 2013-08-08 NOTE — Progress Notes (Signed)
          History of Present Illness:  Mrs. Goldin has returned for followup of left-sided colitis.  On either lialda or apriso she has remained in clinical remission.  She has no GI complaints.  Last colonoscopy was 4 years ago.  Since her last visit she had surgery on her foot and on her sinus.  The latter was complicated by what she describes as pericardial fluid required hospitalization.  She is undergoing physical therapy and is feeling well.    Review of Systems: Pertinent positive and negative review of systems were noted in the above HPI section. All other review of systems were otherwise negative.    Current Medications, Allergies, Past Medical History, Past Surgical History, Family History and Social History were reviewed in [facility] record  Vital signs were reviewed in today's medical record. Physical Exam: General: Well developed , well nourished, no acute distress Skin: anicteric Head: Normocephalic and atraumatic Eyes:  sclerae anicteric, EOMI Ears: Normal auditory acuity Mouth: No deformity or lesions Lungs: Clear throughout to auscultation Heart: Regular rate and rhythm; no murmurs, rubs or bruits Abdomen: Soft, non tender and non distended. No masses, hepatosplenomegaly or hernias noted. Normal Bowel sounds Rectal:deferred Musculoskeletal: Symmetrical with no gross deformities  Pulses:  Normal pulses noted Extremities: No clubbing, cyanosis, edema or deformities noted Neurological: Alert oriented x 4, grossly nonfocal Psychological:  Alert and cooperative. Normal mood and affect  See Assessment and Plan under Problem List

## 2013-08-08 NOTE — Assessment & Plan Note (Signed)
Plan followup colonoscopy in one year provided that the patient is medically stable

## 2013-08-09 ENCOUNTER — Encounter (HOSPITAL_COMMUNITY)
Admission: RE | Admit: 2013-08-09 | Discharge: 2013-08-09 | Disposition: A | Discharge status: Home / Self Care | Payer: Medicare Other | Source: Ambulatory Visit | Attending: Cardiology | Admitting: Cardiology

## 2013-08-09 DIAGNOSIS — Z5189 Encounter for other specified aftercare: Secondary | ICD-10-CM | POA: Diagnosis not present

## 2013-08-11 ENCOUNTER — Encounter (HOSPITAL_COMMUNITY)
Admission: RE | Admit: 2013-08-11 | Discharge: 2013-08-11 | Disposition: A | Discharge status: Home / Self Care | Payer: Medicare Other | Source: Ambulatory Visit | Attending: Cardiology | Admitting: Cardiology

## 2013-08-11 DIAGNOSIS — Z5189 Encounter for other specified aftercare: Secondary | ICD-10-CM | POA: Diagnosis not present

## 2013-08-14 ENCOUNTER — Encounter (HOSPITAL_COMMUNITY)
Admission: RE | Admit: 2013-08-14 | Discharge: 2013-08-14 | Disposition: A | Discharge status: Home / Self Care | Payer: Medicare Other | Source: Ambulatory Visit | Attending: Cardiology | Admitting: Cardiology

## 2013-08-14 DIAGNOSIS — Z5189 Encounter for other specified aftercare: Secondary | ICD-10-CM | POA: Diagnosis not present

## 2013-08-16 ENCOUNTER — Encounter (HOSPITAL_COMMUNITY): Payer: Medicare Other

## 2013-08-18 ENCOUNTER — Encounter (HOSPITAL_COMMUNITY)
Admission: RE | Admit: 2013-08-18 | Discharge: 2013-08-18 | Disposition: A | Discharge status: Home / Self Care | Payer: Medicare Other | Source: Ambulatory Visit | Attending: Cardiology | Admitting: Cardiology

## 2013-08-18 DIAGNOSIS — Z5189 Encounter for other specified aftercare: Secondary | ICD-10-CM | POA: Diagnosis not present

## 2013-08-18 DIAGNOSIS — I509 Heart failure, unspecified: Secondary | ICD-10-CM | POA: Insufficient documentation

## 2013-08-21 ENCOUNTER — Encounter (HOSPITAL_COMMUNITY)
Admission: RE | Admit: 2013-08-21 | Discharge: 2013-08-21 | Disposition: A | Discharge status: Home / Self Care | Payer: Medicare Other | Source: Ambulatory Visit | Attending: Cardiology | Admitting: Cardiology

## 2013-08-21 DIAGNOSIS — Z5189 Encounter for other specified aftercare: Secondary | ICD-10-CM | POA: Diagnosis not present

## 2013-08-21 DIAGNOSIS — I509 Heart failure, unspecified: Secondary | ICD-10-CM | POA: Diagnosis not present

## 2013-08-23 ENCOUNTER — Encounter (HOSPITAL_COMMUNITY)
Admission: RE | Admit: 2013-08-23 | Discharge: 2013-08-23 | Disposition: A | Discharge status: Home / Self Care | Payer: Medicare Other | Source: Ambulatory Visit | Attending: Cardiology | Admitting: Cardiology

## 2013-08-23 DIAGNOSIS — Z5189 Encounter for other specified aftercare: Secondary | ICD-10-CM | POA: Diagnosis not present

## 2013-08-23 DIAGNOSIS — I509 Heart failure, unspecified: Secondary | ICD-10-CM | POA: Diagnosis not present

## 2013-08-25 ENCOUNTER — Encounter (HOSPITAL_COMMUNITY)
Admission: RE | Admit: 2013-08-25 | Discharge: 2013-08-25 | Disposition: A | Discharge status: Home / Self Care | Payer: Medicare Other | Source: Ambulatory Visit | Attending: Cardiology | Admitting: Cardiology

## 2013-08-25 DIAGNOSIS — Z5189 Encounter for other specified aftercare: Secondary | ICD-10-CM | POA: Diagnosis not present

## 2013-08-25 DIAGNOSIS — I509 Heart failure, unspecified: Secondary | ICD-10-CM | POA: Diagnosis not present

## 2013-08-28 ENCOUNTER — Encounter (HOSPITAL_COMMUNITY)
Admission: RE | Admit: 2013-08-28 | Discharge: 2013-08-28 | Disposition: A | Discharge status: Home / Self Care | Payer: Medicare Other | Source: Ambulatory Visit | Attending: Cardiology | Admitting: Cardiology

## 2013-08-28 DIAGNOSIS — I509 Heart failure, unspecified: Secondary | ICD-10-CM | POA: Diagnosis not present

## 2013-08-28 DIAGNOSIS — Z5189 Encounter for other specified aftercare: Secondary | ICD-10-CM | POA: Diagnosis not present

## 2013-08-29 NOTE — Progress Notes (Signed)
Patient graduated from [facility] today on 08/28/13 after completing 36 sessions. He achieved LTG of 30 minutes of aerobic exercise at Max Met level of 3.7. All patients vitals are WNL. Patient has met with dietician. Discharge instruction has been reviewed in detail and patient stated an understanding of material given. Patient plans to exercise at [facility] and walk when permits on outdoors. Discussed your home exercise program and went over medications. Cardiac Rehab staff will make f/u calls at 1 month, 6 months, and 1 year. Patient had no complaints of any abnormal S/S or pain on their exit visit.  Russella Dar, Manager

## 2013-09-01 ENCOUNTER — Telehealth: Payer: Self-pay | Admitting: Cardiology

## 2013-09-01 NOTE — Telephone Encounter (Signed)
For the last two weeks  Pt has been heaving LE are  Weak  and achy. Pt called the pharmacist which stated that it could be the Zetia. Pt is taking 10 mg at bedtime. Pt said when she was taking Lipitor some time ago. She said had pain in her legs, it was so bad that it was a crippling pain. At the time she did continued taking the medication because she did not realized that it was the Lipitor that was causing the pain. Pt is aware that this message will be send to MD for reviewing.

## 2013-09-01 NOTE — Telephone Encounter (Signed)
New Message:  Pt is c/o of her legs hurting and her hip.. Pt states the pharmicist thought it might be her zetia Rx that is causing this... Pt would like to speak to the nurse .Marland KitchenMarland KitchenMarland KitchenMarland Kitchen

## 2013-09-01 NOTE — Telephone Encounter (Signed)
Hi Cindy [name], This is a weird message as I didn't start this patient on Lipitor, I am not sure who started it, but she should come sometimes the next week for CMP. Thank you, k

## 2013-09-01 NOTE — Telephone Encounter (Signed)
**Note De-Identified  Obfuscation** The pt states that she is no longer taking Lipitor due to leg pain and that she has been on Zetia that was prescribed by Dr Elonda Husky, her OBGYN for about 2 years now. She says that her leg pain just recently started. I advised her to stop taking Zetia for 1 month to see if her leg pain stops and to call us at that time to let us know. She verbalized understanding and agrees with plan.

## 2013-09-04 ENCOUNTER — Encounter (HOSPITAL_COMMUNITY)
Admission: RE | Admit: 2013-09-04 | Discharge: 2013-09-04 | Disposition: A | Discharge status: Home / Self Care | Payer: Medicare Other | Source: Ambulatory Visit | Attending: Cardiology | Admitting: Cardiology

## 2013-09-06 ENCOUNTER — Encounter (HOSPITAL_COMMUNITY)
Admission: RE | Admit: 2013-09-06 | Discharge: 2013-09-06 | Disposition: A | Discharge status: Home / Self Care | Payer: Medicare Other | Source: Ambulatory Visit | Attending: Cardiology | Admitting: Cardiology

## 2013-09-08 ENCOUNTER — Encounter (HOSPITAL_COMMUNITY)
Admission: RE | Admit: 2013-09-08 | Discharge: 2013-09-08 | Disposition: A | Discharge status: Home / Self Care | Payer: Medicare Other | Source: Ambulatory Visit | Attending: Cardiology | Admitting: Cardiology

## 2013-09-11 ENCOUNTER — Encounter (HOSPITAL_COMMUNITY): Payer: Medicare Other

## 2013-09-12 DIAGNOSIS — R070 Pain in throat: Secondary | ICD-10-CM | POA: Diagnosis not present

## 2013-09-13 ENCOUNTER — Encounter (HOSPITAL_COMMUNITY)
Admission: RE | Admit: 2013-09-13 | Discharge: 2013-09-13 | Disposition: A | Discharge status: Home / Self Care | Payer: Medicare Other | Source: Ambulatory Visit | Attending: Cardiology | Admitting: Cardiology

## 2013-09-14 DIAGNOSIS — Z803 Family history of malignant neoplasm of breast: Secondary | ICD-10-CM | POA: Diagnosis not present

## 2013-09-14 DIAGNOSIS — Z1231 Encounter for screening mammogram for malignant neoplasm of breast: Secondary | ICD-10-CM | POA: Diagnosis not present

## 2013-09-15 ENCOUNTER — Encounter (HOSPITAL_COMMUNITY)
Admission: RE | Admit: 2013-09-15 | Discharge: 2013-09-15 | Disposition: A | Discharge status: Home / Self Care | Payer: Medicare Other | Source: Ambulatory Visit | Attending: Cardiology | Admitting: Cardiology

## 2013-09-18 ENCOUNTER — Encounter (HOSPITAL_COMMUNITY)
Admission: RE | Admit: 2013-09-18 | Discharge: 2013-09-18 | Disposition: A | Discharge status: Home / Self Care | Payer: Medicare Other | Source: Ambulatory Visit | Attending: Cardiology | Admitting: Cardiology

## 2013-09-18 DIAGNOSIS — I509 Heart failure, unspecified: Secondary | ICD-10-CM | POA: Insufficient documentation

## 2013-09-18 DIAGNOSIS — Z5189 Encounter for other specified aftercare: Secondary | ICD-10-CM | POA: Insufficient documentation

## 2013-09-20 ENCOUNTER — Encounter (HOSPITAL_COMMUNITY)
Admission: RE | Admit: 2013-09-20 | Discharge: 2013-09-20 | Disposition: A | Discharge status: Home / Self Care | Payer: Medicare Other | Source: Ambulatory Visit | Attending: Cardiology | Admitting: Cardiology

## 2013-09-20 DIAGNOSIS — N63 Unspecified lump in unspecified breast: Secondary | ICD-10-CM | POA: Diagnosis not present

## 2013-09-21 DIAGNOSIS — M5430 Sciatica, unspecified side: Secondary | ICD-10-CM | POA: Diagnosis not present

## 2013-09-21 DIAGNOSIS — M47817 Spondylosis without myelopathy or radiculopathy, lumbosacral region: Secondary | ICD-10-CM | POA: Diagnosis not present

## 2013-09-21 DIAGNOSIS — M999 Biomechanical lesion, unspecified: Secondary | ICD-10-CM | POA: Diagnosis not present

## 2013-09-22 ENCOUNTER — Encounter (HOSPITAL_COMMUNITY)
Admission: RE | Admit: 2013-09-22 | Discharge: 2013-09-22 | Disposition: A | Discharge status: Home / Self Care | Payer: Medicare Other | Source: Ambulatory Visit | Attending: Cardiology | Admitting: Cardiology

## 2013-09-25 ENCOUNTER — Encounter (HOSPITAL_COMMUNITY)
Admission: RE | Admit: 2013-09-25 | Discharge: 2013-09-25 | Disposition: A | Discharge status: Home / Self Care | Payer: Medicare Other | Source: Ambulatory Visit | Attending: Cardiology | Admitting: Cardiology

## 2013-09-25 DIAGNOSIS — M47817 Spondylosis without myelopathy or radiculopathy, lumbosacral region: Secondary | ICD-10-CM | POA: Diagnosis not present

## 2013-09-25 DIAGNOSIS — M5430 Sciatica, unspecified side: Secondary | ICD-10-CM | POA: Diagnosis not present

## 2013-09-25 DIAGNOSIS — M999 Biomechanical lesion, unspecified: Secondary | ICD-10-CM | POA: Diagnosis not present

## 2013-09-26 DIAGNOSIS — N952 Postmenopausal atrophic vaginitis: Secondary | ICD-10-CM | POA: Diagnosis not present

## 2013-09-27 ENCOUNTER — Encounter (HOSPITAL_COMMUNITY)
Admission: RE | Admit: 2013-09-27 | Discharge: 2013-09-27 | Disposition: A | Discharge status: Home / Self Care | Payer: Medicare Other | Source: Ambulatory Visit | Attending: Cardiology | Admitting: Cardiology

## 2013-09-27 DIAGNOSIS — M47817 Spondylosis without myelopathy or radiculopathy, lumbosacral region: Secondary | ICD-10-CM | POA: Diagnosis not present

## 2013-09-27 DIAGNOSIS — M5430 Sciatica, unspecified side: Secondary | ICD-10-CM | POA: Diagnosis not present

## 2013-09-27 DIAGNOSIS — M999 Biomechanical lesion, unspecified: Secondary | ICD-10-CM | POA: Diagnosis not present

## 2013-09-29 ENCOUNTER — Encounter (HOSPITAL_COMMUNITY)
Admission: RE | Admit: 2013-09-29 | Discharge: 2013-09-29 | Disposition: A | Discharge status: Home / Self Care | Payer: Medicare Other | Source: Ambulatory Visit | Attending: Cardiology | Admitting: Cardiology

## 2013-10-02 ENCOUNTER — Encounter (HOSPITAL_COMMUNITY)
Admission: RE | Admit: 2013-10-02 | Discharge: 2013-10-02 | Disposition: A | Discharge status: Home / Self Care | Payer: Medicare Other | Source: Ambulatory Visit | Attending: Cardiology | Admitting: Cardiology

## 2013-10-02 DIAGNOSIS — M5430 Sciatica, unspecified side: Secondary | ICD-10-CM | POA: Diagnosis not present

## 2013-10-02 DIAGNOSIS — M47817 Spondylosis without myelopathy or radiculopathy, lumbosacral region: Secondary | ICD-10-CM | POA: Diagnosis not present

## 2013-10-02 DIAGNOSIS — M999 Biomechanical lesion, unspecified: Secondary | ICD-10-CM | POA: Diagnosis not present

## 2013-10-04 ENCOUNTER — Encounter (HOSPITAL_COMMUNITY)
Admission: RE | Admit: 2013-10-04 | Discharge: 2013-10-04 | Disposition: A | Discharge status: Home / Self Care | Payer: Medicare Other | Source: Ambulatory Visit | Attending: Cardiology | Admitting: Cardiology

## 2013-10-06 ENCOUNTER — Encounter (HOSPITAL_COMMUNITY): Payer: Medicare Other

## 2013-10-11 DIAGNOSIS — M5430 Sciatica, unspecified side: Secondary | ICD-10-CM | POA: Diagnosis not present

## 2013-10-11 DIAGNOSIS — M999 Biomechanical lesion, unspecified: Secondary | ICD-10-CM | POA: Diagnosis not present

## 2013-10-11 DIAGNOSIS — M47817 Spondylosis without myelopathy or radiculopathy, lumbosacral region: Secondary | ICD-10-CM | POA: Diagnosis not present

## 2013-10-12 DIAGNOSIS — N952 Postmenopausal atrophic vaginitis: Secondary | ICD-10-CM | POA: Diagnosis not present

## 2013-10-23 ENCOUNTER — Telehealth: Payer: Self-pay | Admitting: Cardiology

## 2013-10-23 NOTE — Telephone Encounter (Signed)
New message    patient calling C/O every night numbness in left hand & finger. More on left. Legs are weak. Tired.

## 2013-10-23 NOTE — Telephone Encounter (Signed)
Calling stating she has been having numbness and weakness in fingers on (L) hand and big toe esp at night. No symptoms of stroke.  Has also been having pain in buttocks that radiates down her leg.  She is seeing a chiropractor for that pain.  She thinks it could be her CHF causing symptoms.  Advised to call Dr. Luan Pulling for him to evaluate.  Does not sound like cardiac related symptoms of CHF. She said she will call an make an appointment.

## 2013-10-26 ENCOUNTER — Other Ambulatory Visit (HOSPITAL_COMMUNITY): Payer: Self-pay | Admitting: Pulmonary Disease

## 2013-10-26 DIAGNOSIS — M5410 Radiculopathy, site unspecified: Secondary | ICD-10-CM

## 2013-10-26 DIAGNOSIS — I509 Heart failure, unspecified: Secondary | ICD-10-CM | POA: Diagnosis not present

## 2013-10-26 DIAGNOSIS — E039 Hypothyroidism, unspecified: Secondary | ICD-10-CM | POA: Diagnosis not present

## 2013-10-26 DIAGNOSIS — J209 Acute bronchitis, unspecified: Secondary | ICD-10-CM | POA: Diagnosis not present

## 2013-10-27 ENCOUNTER — Ambulatory Visit
Admission: RE | Admit: 2013-10-27 | Discharge: 2013-10-27 | Disposition: A | Discharge status: Home / Self Care | Payer: Medicare Other | Source: Ambulatory Visit | Attending: Pulmonary Disease | Admitting: Pulmonary Disease

## 2013-10-27 DIAGNOSIS — M5137 Other intervertebral disc degeneration, lumbosacral region: Secondary | ICD-10-CM | POA: Diagnosis not present

## 2013-10-27 DIAGNOSIS — M5410 Radiculopathy, site unspecified: Secondary | ICD-10-CM

## 2013-10-31 ENCOUNTER — Ambulatory Visit (HOSPITAL_COMMUNITY): Payer: Medicare Other

## 2013-11-13 ENCOUNTER — Ambulatory Visit (HOSPITAL_COMMUNITY)
Admission: RE | Admit: 2013-11-13 | Discharge: 2013-11-13 | Disposition: A | Discharge status: Home / Self Care | Payer: Medicare Other | Source: Ambulatory Visit | Attending: Pulmonary Disease | Admitting: Pulmonary Disease

## 2013-11-13 ENCOUNTER — Other Ambulatory Visit (HOSPITAL_COMMUNITY): Payer: Self-pay | Admitting: Pulmonary Disease

## 2013-11-13 DIAGNOSIS — R0989 Other specified symptoms and signs involving the circulatory and respiratory systems: Secondary | ICD-10-CM | POA: Diagnosis not present

## 2013-11-14 ENCOUNTER — Other Ambulatory Visit: Payer: Self-pay | Admitting: *Deleted

## 2013-11-14 MED ORDER — POTASSIUM CHLORIDE ER 10 MEQ PO TBCR: 10.0000 meq | EXTENDED_RELEASE_TABLET | Freq: Every day | ORAL | Status: DC

## 2013-11-28 ENCOUNTER — Telehealth: Payer: Self-pay | Admitting: Cardiology

## 2013-11-28 DIAGNOSIS — R609 Edema, unspecified: Secondary | ICD-10-CM

## 2013-11-28 DIAGNOSIS — R000 Tachycardia, unspecified: Secondary | ICD-10-CM

## 2013-11-28 DIAGNOSIS — R0602 Shortness of breath: Secondary | ICD-10-CM

## 2013-11-28 DIAGNOSIS — I5031 Acute diastolic (congestive) heart failure: Secondary | ICD-10-CM

## 2013-11-28 DIAGNOSIS — E785 Hyperlipidemia, unspecified: Secondary | ICD-10-CM

## 2013-11-28 NOTE — Telephone Encounter (Signed)
Contacted pt to inform her that per Dr Meda Coffee, she can discontinue her zetia and proceed with taking the red yeast rice instead.  Informed pt that per Dr Meda Coffee, she needs to come in for labs in 3 weeks (CMP and CPK).  Scheduled lab appt for 9/3.  Pt verbalized understanding and agrees with this plan.

## 2013-11-28 NOTE — Telephone Encounter (Signed)
Yes, she can take that, but we will have to schedule CMP and CPK in 3 weeks, thank you

## 2013-11-28 NOTE — Telephone Encounter (Signed)
Will route this message to Dr Meda Coffee for further review and recommendation and follow-up thereafter.

## 2013-11-28 NOTE — Telephone Encounter (Signed)
New message     Pt stopped zetia because of leg pain.  Her legs stopped hurting.  Can she take red yeast rice capsule?

## 2013-12-05 DIAGNOSIS — H26499 Other secondary cataract, unspecified eye: Secondary | ICD-10-CM | POA: Diagnosis not present

## 2013-12-05 DIAGNOSIS — Z961 Presence of intraocular lens: Secondary | ICD-10-CM | POA: Diagnosis not present

## 2013-12-08 ENCOUNTER — Encounter (HOSPITAL_COMMUNITY): Payer: Self-pay | Admitting: Pharmacy Technician

## 2013-12-12 ENCOUNTER — Encounter (HOSPITAL_COMMUNITY): Payer: Self-pay | Admitting: *Deleted

## 2013-12-12 ENCOUNTER — Ambulatory Visit (HOSPITAL_COMMUNITY)
Admission: RE | Admit: 2013-12-12 | Discharge: 2013-12-12 | Disposition: A | Discharge status: Home / Self Care | Payer: Medicare Other | Source: Ambulatory Visit | Attending: Ophthalmology | Admitting: Ophthalmology

## 2013-12-12 ENCOUNTER — Encounter (HOSPITAL_COMMUNITY)
Admission: RE | Disposition: A | Discharge status: Home / Self Care | Payer: Self-pay | Source: Ambulatory Visit | Attending: Ophthalmology

## 2013-12-12 DIAGNOSIS — H26499 Other secondary cataract, unspecified eye: Secondary | ICD-10-CM | POA: Diagnosis not present

## 2013-12-12 DIAGNOSIS — Z7982 Long term (current) use of aspirin: Secondary | ICD-10-CM | POA: Insufficient documentation

## 2013-12-12 HISTORY — PX: YAG LASER APPLICATION: SHX6189

## 2013-12-12 SURGERY — TREATMENT, USING YAG LASER
Anesthesia: LOCAL | Laterality: Right

## 2013-12-12 MED ORDER — CYCLOPENTOLATE-PHENYLEPHRINE 0.2-1 % OP SOLN: 1.0000 [drp] | OPHTHALMIC | Status: DC | PRN

## 2013-12-12 MED ORDER — CYCLOPENTOLATE-PHENYLEPHRINE 0.2-1 % OP SOLN
1.0000 [drp] | OPHTHALMIC | Status: AC
  Administered 2013-12-12 (×2): 1 [drp] via OPHTHALMIC

## 2013-12-12 MED ORDER — CYCLOPENTOLATE-PHENYLEPHRINE OP SOLN OPTIME - NO CHARGE
OPHTHALMIC | Status: AC
  Filled 2013-12-12: qty 2

## 2013-12-12 NOTE — Op Note (Signed)
Kadian Barcellos T. Gershon Crane, MD  Procedure: Yag Capsulotomy  Yag Laser Self Test Completedyes. Procedure: Posterior Capsulotomy, Eye Protection Worn by Staff yes. Laser In Use Sign on Door yes.  Laser: Nd:YAG Spot Size: Fixed Burst Mode: III Power Setting: 3,5 mJ/burst Number of shots: 13 Total energy delivered: 45.8 mJ   The patient tolerated the procedure without difficulty. No complications were encountered.   The patient was discharged home with the instructions to continue all her current glaucoma medications, if any.   Patient instructed to go to office at 0100 for intraocular pressure check.  Patient verbalizes understanding of discharge instructions Yes.  .    Pre-Operative Diagnosis: After-Cataract, obscuring vision, 366.53 OD Post-Operative Diagnosis: After-Cataract, obscuring vision, 366.53 OD

## 2013-12-12 NOTE — H&P (Signed)
The patient was re examined and there is no change in the patients condition since the original H and P. 

## 2013-12-12 NOTE — Discharge Instructions (Signed)
Cindy [name]  12/12/2013     Instructions    Activity: No Restrictions.   Diet: Resume Diet you were on at home.   Pain Medication: Tylenol if Needed.   CONTACT YOUR DOCTOR IF YOU HAVE PAIN, REDNESS IN YOUR EYE, OR DECREASED VISION.   Follow-up:today with Elta Guadeloupe T. Gershon Crane, MD.   Dr. Loran Senters: [phone]       If you find that you cannot contact your physician, but feel that your signs and   Symptoms warrant a physician's attention, call the Emergency Room at   [phone] ext.[phone].

## 2013-12-14 ENCOUNTER — Encounter (HOSPITAL_COMMUNITY): Payer: Self-pay | Admitting: Ophthalmology

## 2013-12-21 ENCOUNTER — Other Ambulatory Visit (INDEPENDENT_AMBULATORY_CARE_PROVIDER_SITE_OTHER): Payer: Medicare Other

## 2013-12-21 DIAGNOSIS — R609 Edema, unspecified: Secondary | ICD-10-CM | POA: Diagnosis not present

## 2013-12-21 DIAGNOSIS — E785 Hyperlipidemia, unspecified: Secondary | ICD-10-CM

## 2013-12-21 DIAGNOSIS — R000 Tachycardia, unspecified: Secondary | ICD-10-CM

## 2013-12-21 DIAGNOSIS — I498 Other specified cardiac arrhythmias: Secondary | ICD-10-CM

## 2013-12-21 DIAGNOSIS — I5031 Acute diastolic (congestive) heart failure: Secondary | ICD-10-CM | POA: Diagnosis not present

## 2013-12-21 DIAGNOSIS — R0602 Shortness of breath: Secondary | ICD-10-CM

## 2013-12-21 LAB — COMPREHENSIVE METABOLIC PANEL
ALT: 11 U/L (ref 0–35)
AST: 18 U/L (ref 0–37)
Albumin: 3.7 g/dL (ref 3.5–5.2)
Alkaline Phosphatase: 82 U/L (ref 39–117)
BUN: 8 mg/dL (ref 6–23)
CO2: 28 mEq/L (ref 19–32)
Calcium: 9.4 mg/dL (ref 8.4–10.5)
Chloride: 99 mEq/L (ref 96–112)
Creatinine, Ser: 0.6 mg/dL (ref 0.4–1.2)
GFR: 108.71 mL/min (ref >60.00)
Glucose, Bld: 93 mg/dL (ref 70–99)
Potassium: 4.1 mEq/L (ref 3.5–5.1)
Sodium: 132 mEq/L — ABNORMAL LOW (ref 135–145)
Total Bilirubin: 1 mg/dL (ref 0.2–1.2)
Total Protein: 6.9 g/dL (ref 6.0–8.3)

## 2013-12-21 LAB — LIPID PANEL
Cholesterol: 242 mg/dL — ABNORMAL HIGH (ref 0–200)
HDL: 43.7 mg/dL (ref >39.00)
LDL Cholesterol: 183 mg/dL — ABNORMAL HIGH (ref 0–99)
NonHDL: 198.3
Total CHOL/HDL Ratio: 6
Triglycerides: 75 mg/dL (ref 0.0–149.0)
VLDL: 15 mg/dL (ref 0.0–40.0)

## 2013-12-21 LAB — CK: Total CK: 33 U/L (ref 7–177)

## 2013-12-29 ENCOUNTER — Telehealth: Payer: Self-pay | Admitting: Gastroenterology

## 2013-12-29 NOTE — Telephone Encounter (Signed)
Explained to patient that we are not giving out any more samples unless you are in the office for a visit, We are out of  Lialda samples

## 2014-01-01 ENCOUNTER — Encounter: Payer: Self-pay | Admitting: Cardiology

## 2014-01-01 ENCOUNTER — Ambulatory Visit (INDEPENDENT_AMBULATORY_CARE_PROVIDER_SITE_OTHER): Payer: Medicare Other | Admitting: Cardiology

## 2014-01-01 VITALS — BP 118/74 | HR 84 | Ht 68.0 in | Wt 149.8 lb

## 2014-01-01 DIAGNOSIS — R0789 Other chest pain: Secondary | ICD-10-CM

## 2014-01-01 DIAGNOSIS — E785 Hyperlipidemia, unspecified: Secondary | ICD-10-CM | POA: Diagnosis not present

## 2014-01-01 DIAGNOSIS — R079 Chest pain, unspecified: Secondary | ICD-10-CM

## 2014-01-01 MED ORDER — PRAVASTATIN SODIUM 20 MG PO TABS: 20.0000 mg | ORAL_TABLET | Freq: Every evening | ORAL | Status: DC

## 2014-01-01 NOTE — Progress Notes (Signed)
Patient ID: Cindy [name], female   DOB: 1932/07/09, 80 y.o.   MRN: [id number]     Patient Name: Cindy [name] Date of Encounter: 01/01/2014  Primary Care Provider:  [name], MD Primary Cardiologist:  Dorothy Spark  Problem List   Past Medical History  Diagnosis Date  . Osteoporosis, unspecified   . Edema   . Other and unspecified hyperlipidemia   . Unspecified hypothyroidism   . Diarrhea   . Other and unspecified noninfectious gastroenteritis and colitis(558.9)   . Family history of malignant neoplasm of gastrointestinal tract   . Flatulence, eructation, and gas pain   . GERD (gastroesophageal reflux disease)   . Stricture and stenosis of esophagus   . Esophagitis, unspecified   . Diverticulosis of colon (without mention of hemorrhage)   . Left sided ulcerative colitis   . URI (upper respiratory infection)   . Maxillary sinus mass   . Fibrosis of lung 03/14/2013  . CHF (congestive heart failure)    Past Surgical History  Procedure Laterality Date  . Abdominal hysterectomy    . Cholecystectomy    . Thyroidectomy, partial    . Foot surgery    . Yag laser application Right 5/42/7062    Procedure: YAG LASER APPLICATION;  Surgeon: Elta Guadeloupe T. Gershon Crane, MD;  Location: [facility];  Service: Ophthalmology;  Laterality: Right;    Allergies  Allergies  Allergen Reactions  . Codeine Nausea Only  . Sulfonamide Derivatives Nausea Only    HPI  80 y.o. female with a PMH of hyperlipidemia, hypothyroidism, history of ulcerative colitis who was recently underwent a sinus surgery after which she developed symptoms of SOB and lower extremity edema.  Her symptoms were thought to be due to bronchitis and interstitial pneumonitis. She was admitted for further evaluation and workup. She was diagnosed with acute diastolic heart failure and started on oral Lasix with good response. The patient comes 2 weeks after the discharge she states that her shortness of breath has almost  resolved, and her lower extremity edema has improved. She denies any orthopnea, paroxysmal nocturnal dyspnea dizziness or syncope. She states that she never had a chest pain, and she doesn't experiences significant shortness of breath however she's not involved in any physical activity currently. The patient states that she has significant family history of coronary artery disease, her father died of myocardial infarction in his 42s but multiple brothers died of myocardial infarction one of them in at age of 56 and the others at their 47s.  She is coming after 6 months, her energy level has significantly improved, she has completed cardiac rehab in April 2015 and now continues to exercise as part of silver sneakers. She denies CP, SOB, palpitations. Also denies LE edema, orthopnea, PND, or DOE.  Her major problem is intolerance to statin, had to stop taking Lipitor, also Zetia due to muscle pain.    Home Medications  Prior to Admission medications   Medication Sig Start Date End Date Taking? Authorizing Provider  ALPRAZolam Duanne Moron) 0.5 MG tablet Take 1 tablet (0.5 mg total) by mouth at bedtime as needed for anxiety. 03/15/13  Yes Robbie Lis, MD  aspirin EC 81 MG tablet Take 81 mg by mouth 4 (four) times a week.    Yes Historical Provider, MD  cholecalciferol (VITAMIN D) 1000 UNITS tablet Take 1,000 Units by mouth daily.     Yes Historical Provider, MD  fluticasone (FLONASE) 50 MCG/ACT nasal spray Place 2 sprays into the nose daily  as needed. 12/17/12  Yes Historical Provider, MD  furosemide (LASIX) 40 MG tablet Take 1 tablet (40 mg total) by mouth daily. 03/15/13  Yes Robbie Lis, MD  levothyroxine (SYNTHROID, LEVOTHROID) 112 MCG tablet Take 112 mcg by mouth daily before breakfast.   Yes Historical Provider, MD  mesalamine (APRISO) 0.375 G 24 hr capsule 3 BY MOUTH ONCE DAILY 01/12/12  Yes Inda Castle, MD  naproxen (NAPROSYN) 250 MG tablet Take 250 mg by mouth 2 (two) times daily as needed  (pain).   Yes Historical Provider, MD  omeprazole (PRILOSEC) 20 MG capsule Take 1 capsule (20 mg total) by mouth daily. 01/12/12  Yes Inda Castle, MD  Potassium Chloride ER 20 MEQ TBCR Take 10 mEq by mouth daily. 03/15/13  Yes Robbie Lis, MD  Propylene Glycol (SYSTANE BALANCE) 0.6 % SOLN Apply 1 drop to eye daily as needed (dry eyes).    Yes Historical Provider, MD  vitamin C (ASCORBIC ACID) 500 MG tablet Take 500 mg by mouth daily.   Yes Historical Provider, MD  ZETIA 10 MG tablet TAKE ONE TABLET BY MOUTH ONCE DAILY. 10/22/12  Yes Florian Buff, MD    Family History  Family History  Problem Relation Age of Onset  . Colon cancer Mother   . Stomach cancer Sister   . Diabetes Brother   . Coronary artery disease Other   . Diabetes Other     Niece  . Diabetes Son     Social History  History   Social History  . Marital Status: Married    Spouse Name: N/A    Number of Children: N/A  . Years of Education: N/A   Occupational History  . Retired    Social History Main Topics  . Smoking status: Never Smoker   . Smokeless tobacco: Never Used  . Alcohol Use: No  . Drug Use: No  . Sexual Activity: Yes    Birth Control/ Protection: Surgical   Other Topics Concern  . Not on file   Social History Narrative  . No narrative on file     Review of Systems, as per HPI, otherwise negative General:  No chills, fever, night sweats or weight changes.  Cardiovascular:  No chest pain, dyspnea on exertion, edema, orthopnea, palpitations, paroxysmal nocturnal dyspnea. Dermatological: No rash, lesions/masses Respiratory: No cough, dyspnea Urologic: No hematuria, dysuria Abdominal:   No nausea, vomiting, diarrhea, bright red blood per rectum, melena, or hematemesis Neurologic:  No visual changes, wkns, changes in mental status. All other systems reviewed and are otherwise negative except as noted above.  Physical Exam  Blood pressure 118/74, pulse 84, height 5' 8"  (1.727 m), weight  149 lb 12.8 oz (67.949 kg).  General: Pleasant, NAD Psych: Normal affect. Neuro: Alert and oriented X 3. Moves all extremities spontaneously. HEENT: Normal  Neck: Supple without bruits or JVD. Lungs:  Resp regular and unlabored, CTA. Heart: RRR no s3, s4, or murmurs. Abdomen: Soft, non-tender, non-distended, BS + x 4.  Extremities: No clubbing, cyanosis, non-pitting edema up to the knees, calves tender to palpation B/L. DP/PT/Radials 2+ and equal bilaterally.  Labs:  No results found for this basename: CKTOTAL, CKMB, TROPONINI,  in the last 72 hours Lab Results  Component Value Date   WBC 7.4 03/14/2013   HGB 11.6* 03/14/2013   HCT 35.4* 03/14/2013   MCV 91.9 03/14/2013   PLT 271 03/14/2013   No results found for this basename: NA, K, CL, CO2, BUN, CREATININE, CALCIUM,  LABALBU, PROT, BILITOT, ALKPHOS, ALT, AST, GLUCOSE,  in the last 168 hours Lab Results  Component Value Date   CHOL 242* 12/21/2013   HDL 43.70 12/21/2013   LDLCALC 183* 12/21/2013   TRIG 75.0 12/21/2013   Lab Results  Component Value Date   DDIMER 1.43* 03/13/2013   No components found with this basename: POCBNP,   Accessory Clinical Findings  Echocardiogram 03/14/2013  - Left ventricle: The cavity size was normal. Wall thickness was increased in a pattern of mild LVH. Systolic function was normal. The estimated ejection fraction was in the range of 60% to 65%. Wall motion was normal; there were no regional wall motion abnormalities. Features are consistent with a pseudonormal left ventricular filling pattern, with concomitant abnormal relaxation and increased filling pressure (grade 2 diastolic dysfunction). - Aortic valve: There was no stenosis. - Mitral valve: Mild to moderate regurgitation. - Right ventricle: Poorly visualized. The cavity size was normal. Systolic function was normal. - Tricuspid valve: Peak RV-RA gradient: 76m Hg (S). - Pulmonary arteries: PA peak pressure: 383mHg (S). - Inferior  vena cava: The vessel was normal in size; the respirophasic diameter changes were in the normal range (= 50%); findings are consistent with normal central venous pressure.  ECG - sinus rhythm, 75 beats per minute, negative T waves in V1 through V3 possible anterior ischemia.  Exercise nuclear stress test: 04/19/2013  Quantitative Gated Spect Images  QGS EDV: 54 ml  QGS ESV: 8 ml  Impression  Exercise Capacity: Poor exercise capacity.  BP Response: Normal blood pressure response.  Clinical Symptoms: There is dyspnea.  ECG Impression: No significant ST segment change suggestive of ischemia.  Comparison with Prior Nuclear Study: No images to compare  Overall Impression: Normal stress nuclear study. Limited exercise due to dyspnea  LV Ejection Fraction: 84%. LV Wall Motion: NL LV Function; NL Wall Motion  [place] Assessment & Plan  Principal Problem:   1. New diagnosis of CHF with preserved LV function, grade 2 diastolic dysfunction.  The patient is now euvolemic, we will continue 10 mEq of KCl and cut lasix to 20 mg po daily.  2. Abnormal ECG with possible anterior ischemia, new dg of CHF, significant FH of CAD, negative stress test with poor exercise capacity. She completed rehab and her ECG is normal.  3. Hyperlipidemia - intolerance to statins, and Zetia, we will try pravastatin 20 mg po daily, if intolerant we will start CPSK-9 inhibitors.  4. Hypertension - controlled  5. Lower extremity pain and swelling - negative LE venous [place]/L  6. Tachycardia - baseline and with exercise at rehab, we will start Toprol XL 25 mg po daily.   7. Hypothyroidism - check TSH today.  Follow up in 3 months with CMP and lipids prior to the visit.    NEDorothy SparkMD, FAAlliancehealth Seminole/14/2015, 10:08 AM

## 2014-01-01 NOTE — Patient Instructions (Signed)
**Note De-Identified  Obfuscation** Your physician has recommended you make the following change in your medication: stop taking Red yeast rice and start taking Pravastatin 20 mg at bedtime.  Your physician recommends that you return for fasting lab work in: 3 months just before your next follow up. (Lipids and CMET)  Your physician recommends that you schedule a follow-up appointment in: 3 months.

## 2014-01-03 ENCOUNTER — Emergency Department (HOSPITAL_COMMUNITY): Payer: Medicare Other

## 2014-01-03 ENCOUNTER — Emergency Department (HOSPITAL_COMMUNITY)
Admission: EM | Admit: 2014-01-03 | Discharge: 2014-01-03 | Disposition: A | Discharge status: Home / Self Care | Payer: Medicare Other | Attending: Emergency Medicine | Admitting: Emergency Medicine

## 2014-01-03 ENCOUNTER — Encounter (HOSPITAL_COMMUNITY): Payer: Self-pay | Admitting: Emergency Medicine

## 2014-01-03 DIAGNOSIS — I509 Heart failure, unspecified: Secondary | ICD-10-CM | POA: Diagnosis not present

## 2014-01-03 DIAGNOSIS — S46909A Unspecified injury of unspecified muscle, fascia and tendon at shoulder and upper arm level, unspecified arm, initial encounter: Secondary | ICD-10-CM | POA: Insufficient documentation

## 2014-01-03 DIAGNOSIS — S4980XA Other specified injuries of shoulder and upper arm, unspecified arm, initial encounter: Secondary | ICD-10-CM | POA: Diagnosis not present

## 2014-01-03 DIAGNOSIS — Y9289 Other specified places as the place of occurrence of the external cause: Secondary | ICD-10-CM | POA: Insufficient documentation

## 2014-01-03 DIAGNOSIS — R079 Chest pain, unspecified: Secondary | ICD-10-CM | POA: Diagnosis not present

## 2014-01-03 DIAGNOSIS — M25519 Pain in unspecified shoulder: Secondary | ICD-10-CM | POA: Diagnosis not present

## 2014-01-03 DIAGNOSIS — K219 Gastro-esophageal reflux disease without esophagitis: Secondary | ICD-10-CM | POA: Insufficient documentation

## 2014-01-03 DIAGNOSIS — Y9389 Activity, other specified: Secondary | ICD-10-CM | POA: Insufficient documentation

## 2014-01-03 DIAGNOSIS — E039 Hypothyroidism, unspecified: Secondary | ICD-10-CM | POA: Diagnosis not present

## 2014-01-03 DIAGNOSIS — Z79899 Other long term (current) drug therapy: Secondary | ICD-10-CM | POA: Insufficient documentation

## 2014-01-03 DIAGNOSIS — Z8739 Personal history of other diseases of the musculoskeletal system and connective tissue: Secondary | ICD-10-CM | POA: Diagnosis not present

## 2014-01-03 DIAGNOSIS — M7989 Other specified soft tissue disorders: Secondary | ICD-10-CM | POA: Diagnosis not present

## 2014-01-03 DIAGNOSIS — S298XXA Other specified injuries of thorax, initial encounter: Secondary | ICD-10-CM | POA: Diagnosis not present

## 2014-01-03 DIAGNOSIS — Z791 Long term (current) use of non-steroidal anti-inflammatories (NSAID): Secondary | ICD-10-CM | POA: Insufficient documentation

## 2014-01-03 DIAGNOSIS — S8000XA Contusion of unspecified knee, initial encounter: Secondary | ICD-10-CM | POA: Diagnosis not present

## 2014-01-03 DIAGNOSIS — E785 Hyperlipidemia, unspecified: Secondary | ICD-10-CM | POA: Diagnosis not present

## 2014-01-03 DIAGNOSIS — Z8709 Personal history of other diseases of the respiratory system: Secondary | ICD-10-CM | POA: Insufficient documentation

## 2014-01-03 DIAGNOSIS — S99919A Unspecified injury of unspecified ankle, initial encounter: Secondary | ICD-10-CM | POA: Diagnosis not present

## 2014-01-03 DIAGNOSIS — Z9889 Other specified postprocedural states: Secondary | ICD-10-CM | POA: Diagnosis not present

## 2014-01-03 DIAGNOSIS — W1809XA Striking against other object with subsequent fall, initial encounter: Secondary | ICD-10-CM | POA: Insufficient documentation

## 2014-01-03 DIAGNOSIS — W19XXXA Unspecified fall, initial encounter: Secondary | ICD-10-CM

## 2014-01-03 DIAGNOSIS — Z7982 Long term (current) use of aspirin: Secondary | ICD-10-CM | POA: Diagnosis not present

## 2014-01-03 DIAGNOSIS — M25569 Pain in unspecified knee: Secondary | ICD-10-CM | POA: Diagnosis not present

## 2014-01-03 DIAGNOSIS — S8990XA Unspecified injury of unspecified lower leg, initial encounter: Secondary | ICD-10-CM | POA: Diagnosis not present

## 2014-01-03 NOTE — Discharge Instructions (Signed)
Follow up with dr. Aline Brochure next week.  Keep leg elevated

## 2014-01-03 NOTE — ED Notes (Signed)
MD at bedside. 

## 2014-01-03 NOTE — ED Notes (Signed)
Patient states she was at [facility] today and was carrying her bags to the car and fell. States she doesn't know what happened and just wound up on the floor. States pain and swelling of left knee. Can't put weight on left knee. Also complains of bruising of right knee and left shoulder.

## 2014-01-03 NOTE — ED Provider Notes (Signed)
CSN: [id number]     Arrival date & time 01/03/14  1421 History   First MD Initiated Contact with Patient 01/03/14 1608     Chief Complaint  Patient presents with  . Fall     (Consider location/radiation/quality/duration/timing/severity/associated sxs/prior Treatment) Patient is a 80 y.o. female presenting with fall. The history is provided by the patient (the pt fell in the parking lot and hit her left knee face and left shoulder.  no loc).  Fall This is a new problem. The current episode started 1 to 2 hours ago. The problem occurs constantly. The problem has not changed since onset.Associated symptoms include chest pain. Exacerbated by: movement of left shoulder and knee. Nothing relieves the symptoms.    Past Medical History  Diagnosis Date  . Osteoporosis, unspecified   . Edema   . Other and unspecified hyperlipidemia   . Unspecified hypothyroidism   . Diarrhea   . Other and unspecified noninfectious gastroenteritis and colitis(558.9)   . Family history of malignant neoplasm of gastrointestinal tract   . Flatulence, eructation, and gas pain   . GERD (gastroesophageal reflux disease)   . Stricture and stenosis of esophagus   . Esophagitis, unspecified   . Diverticulosis of colon (without mention of hemorrhage)   . Left sided ulcerative colitis   . URI (upper respiratory infection)   . Maxillary sinus mass   . Fibrosis of lung 03/14/2013  . CHF (congestive heart failure)    Past Surgical History  Procedure Laterality Date  . Abdominal hysterectomy    . Cholecystectomy    . Thyroidectomy, partial    . Foot surgery    . Yag laser application Right 7/86/7544    Procedure: YAG LASER APPLICATION;  Surgeon: Elta Guadeloupe T. Gershon Crane, MD;  Location: [facility];  Service: Ophthalmology;  Laterality: Right;   Family History  Problem Relation Age of Onset  . Colon cancer Mother   . Stomach cancer Sister   . Diabetes Brother   . Coronary artery disease Other   . Diabetes Other     Niece   . Diabetes Son    History  Substance Use Topics  . Smoking status: Never Smoker   . Smokeless tobacco: Never Used  . Alcohol Use: No   OB History   Grav Para Term Preterm Abortions TAB SAB Ect Mult Living   3 2 2  1  1         Review of Systems  Cardiovascular: Positive for chest pain.      Allergies  Codeine and Sulfonamide derivatives  Home Medications   Prior to Admission medications   Medication Sig Start Date End Date Taking? Authorizing Provider  ALPRAZolam Duanne Moron) 0.5 MG tablet Take 1 tablet (0.5 mg total) by mouth at bedtime as needed for anxiety. 03/15/13  Yes Robbie Lis, MD  Ascorbic Acid (VITAMIN C PO) Take 1 tablet by mouth daily.   Yes Historical Provider, MD  aspirin EC 81 MG tablet Take 81 mg by mouth 4 (four) times a week.    Yes Historical Provider, MD  calcium carbonate (OS-CAL) 600 MG TABS tablet Take 600 mg by mouth daily with breakfast.   Yes Historical Provider, MD  cholecalciferol (VITAMIN D) 1000 UNITS tablet Take 1,000 Units by mouth daily.     Yes Historical Provider, MD  ESTRING 2 MG vaginal ring Place 2 mg vaginally every 3 (three) months.  06/24/13  Yes Historical Provider, MD  furosemide (LASIX) 20 MG tablet Take 1 tablet (20 mg  total) by mouth daily. 06/26/13  Yes Dorothy Spark, MD  levothyroxine (SYNTHROID, LEVOTHROID) 112 MCG tablet Take 112 mcg by mouth daily before breakfast.   Yes Historical Provider, MD  mesalamine (APRISO) 0.375 G 24 hr capsule Take by mouth at bedtime. Take 2 capsules by mouth  at bedtime 08/08/13  Yes Inda Castle, MD  metoprolol succinate (TOPROL-XL) 25 MG 24 hr tablet Take 1 tablet (25 mg total) by mouth daily. 07/13/13  Yes Dorothy Spark, MD  naproxen sodium (ANAPROX) 220 MG tablet Take 220 mg by mouth daily as needed (pain).   Yes Historical Provider, MD  omeprazole (PRILOSEC) 20 MG capsule Take 1 capsule (20 mg total) by mouth daily. 01/12/12  Yes Inda Castle, MD  potassium chloride (K-DUR) 10 MEQ tablet  Take 1 tablet (10 mEq total) by mouth daily. 11/14/13  Yes Darlin Coco, MD  vitamin B-12 (CYANOCOBALAMIN) 1000 MCG tablet Take 1,000 mcg by mouth daily.   Yes Historical Provider, MD  pravastatin (PRAVACHOL) 20 MG tablet Take 1 tablet (20 mg total) by mouth every evening. 01/01/14   Dorothy Spark, MD   BP 112/73  Pulse 81  Temp(Src) 98.6 F (37 C) (Oral)  Resp 18  Ht 5' 8"  (1.727 m)  Wt 147 lb (66.679 kg)  BMI 22.36 kg/m2  SpO2 95% Physical Exam  Constitutional: She is oriented to person, place, and time. She appears well-developed.  HENT:  Head: Normocephalic.  Eyes: Conjunctivae and EOM are normal. No scleral icterus.  Neck: Neck supple. No thyromegaly present.  Cardiovascular: Normal rate and regular rhythm.  Exam reveals no gallop and no friction rub.   No murmur heard. Pulmonary/Chest: No stridor. She has no wheezes. She has no rales. She exhibits tenderness.  Abdominal: She exhibits no distension. There is no tenderness. There is no rebound.  Musculoskeletal: She exhibits no edema.  Bruise to right knee and left knee.  Swollen tender left knee and shoulder  Lymphadenopathy:    She has no cervical adenopathy.  Neurological: She is oriented to person, place, and time. She exhibits normal muscle tone. Coordination normal.  Skin: No rash noted. No erythema.  Psychiatric: She has a normal mood and affect. Her behavior is normal.    ED Course  Procedures (including critical care time) Labs Review Labs Reviewed - No data to display  Imaging Review Dg Ribs Unilateral W/chest Left  01/03/2014   CLINICAL DATA:  Fall in parking lot with left rib pain.  EXAM: LEFT RIBS AND CHEST - 3+ VIEW  COMPARISON:  Chest x-ray on 11/13/2013  FINDINGS: Stable chronic lung disease. There is no evidence of pulmonary edema, consolidation, pneumothorax, nodule or pleural fluid. Stable calcification in the left breast tissue.  Left rib films show no evidence of acute fracture or bony lesion.   IMPRESSION: Stable chronic lung disease. No acute findings or evidence of left rib fracture.   Electronically Signed   By: Aletta Edouard M.D.   On: 01/03/2014 17:15   Dg Shoulder Left  01/03/2014   CLINICAL DATA:  Left shoulder pain following recent fall  EXAM: LEFT SHOULDER - 2+ VIEW  COMPARISON:  None.  FINDINGS: No acute fracture or dislocation is noted. No gross soft tissue abnormality is seen. There is some suggestion of irregularity of the anterior aspect of the left fourth rib which may represent a nondisplaced fracture. Further evaluation may be helpful. There is also some mottled appearance of the proximal left humerus of uncertain significance. Would  be difficult to exclude a myelomatous abnormality on the basis of this exam.  IMPRESSION: No acute fracture is noted.  Questionable changes in the anterior left fourth rib which may represent a nondisplaced fracture. Clinical correlation is recommended.  Somewhat mottled appearance of the proximal left humerus of uncertain significance. An underlying myelomatous abnormality could not be totally excluded.   Electronically Signed   By: Inez Catalina M.D.   On: 01/03/2014 15:24   Dg Knee Complete 4 Views Left  01/03/2014   CLINICAL DATA:  Fall, pain and swelling of left knee  EXAM: LEFT KNEE - COMPLETE 4+ VIEW  COMPARISON:  None.  FINDINGS: No fracture of the proximal tibia or distal femur. Patella is normal. No joint effusion.  IMPRESSION: No acute osseous abnormality.   Electronically Signed   By: Suzy Bouchard M.D.   On: 01/03/2014 15:12     EKG Interpretation None      MDM   Final diagnoses:  Fall, initial encounter    Pt to follow up with dr. Harrold Donath, MD 01/03/14 [phone]

## 2014-01-05 ENCOUNTER — Ambulatory Visit (INDEPENDENT_AMBULATORY_CARE_PROVIDER_SITE_OTHER): Payer: Medicare Other | Admitting: Family Medicine

## 2014-01-05 ENCOUNTER — Encounter: Payer: Self-pay | Admitting: Family Medicine

## 2014-01-05 VITALS — BP 114/61 | HR 91 | Ht 68.0 in | Wt 147.0 lb

## 2014-01-05 DIAGNOSIS — M25562 Pain in left knee: Secondary | ICD-10-CM

## 2014-01-05 DIAGNOSIS — M25462 Effusion, left knee: Secondary | ICD-10-CM

## 2014-01-05 DIAGNOSIS — M25519 Pain in unspecified shoulder: Secondary | ICD-10-CM

## 2014-01-05 DIAGNOSIS — M25469 Effusion, unspecified knee: Secondary | ICD-10-CM

## 2014-01-05 DIAGNOSIS — M25512 Pain in left shoulder: Secondary | ICD-10-CM

## 2014-01-05 DIAGNOSIS — M25569 Pain in unspecified knee: Secondary | ICD-10-CM | POA: Diagnosis not present

## 2014-01-05 MED ORDER — METHYLPREDNISOLONE ACETATE 40 MG/ML IJ SUSP
40.0000 mg | Freq: Once | INTRAMUSCULAR | Status: AC
  Administered 2014-01-05: 40 mg via INTRA_ARTICULAR

## 2014-01-05 NOTE — Progress Notes (Addendum)
Subjective:    Patient ID: Cindy [name], female    DOB: [birth year], 80 y.o.   MRN: [id number]  Shoulder Injury   Very pleasant 80 y.o. female here after fall in grocery store parking lot 2 days prior. She states that was carrying groceries and this caused her to be off-balance. She fell to her left side, striking her left lateral knee, anterior right knee and did strike her chin as well. She did not outstretch her hands as she was carrying the groceries. She denies LOC, can recall full event. She went with husband to outside hospital [facility] where she had plain films of her left knee, left ribs, and left shoulder. All were read as negative. .   Her left shoulder hurts, most in the posterior aspect, with no radiation, no swelling that she can recall, or bruising. She is having trouble with abduction and forward flexion since the fall. She also states concern over her left knee which she states has been swollen since the fall - she has had decreased extension which she attributes to the swelling of this knee and she has ambulated in with a 4 pronged cane. She also cites some bruising to that knee. She denies any numbness, tingling, radiation of either sites.    PERTINENT  PMH / PSH: I have reviewed the patient's medications, allergies, past medical and surgical history. Pertinent findings that relate to today's visit / issues include: History of pulmonary fibrosis, hyperlipidemia, hypothyroidism, ulcerative colitis, osteoporosis Surgical history includes thyroidectomy, partial. SOCIAL history: Lives with her husband who accompanies her today.  Review of Systems See HPI---additionally no fever sweats chills or numbness.    Objective:   Physical Exam Filed Vitals:   01/05/14 1026  BP: 114/61  Pulse: 91  Height: 5' 8"  (1.727 m)  Weight: 147 lb (66.679 kg)   General: well-developed, well-nourished caucasian female in NAD, AAOx3  L shoulder Inspection reveals no abnormalities,  atrophy or asymmetry. Palpation is with tenderness over posterior capsule ROM is markedly reduced with abduction and flexion due to pain, IR and ER somewhat preserved but painful - all compared to contra Rotator cuff strength normal but elicits sharp pain throughout. Normal scapular function observed. No drop arm sign. No apprehension sign  L Knee: Inspection reveals inferolateral 2x3cm bluish discoloration, knee with effusion Palpation with warmth, tenderness on anterolateral knee ROM normal in flexion and with limited extension due to pain and swelling Ligaments with solid consistent endpoints including ACL, PCL, LCL, MCL. Negative Mcmurray's and provocative meniscal tests. Non painful patellar compression. Patellar unremarkable. Quadriceps tendons with some tenderness on lateral side Hamstring and quadriceps strength is normal.  Diagnostic Ultrasound Evalution: [employer] E, MSK ultrasound, MSK probe  Findings: Left shoulder rotator cuff seen well and in entirety with no tears noted; KNEE: meniscus seems appropriate for age, no obvious deformity. Suprapatellar pouch fluid noted. QT and PT intact.  IMAGING: reviewed X rays 01/03/2014  Left knee--agree with findings and additionally / specifically no sign of tibial plateau fracture. Left shoulder--they comment on "mottled appearance of humerus" which I do not really appreciate---seems consistent with age. Shoulder without sign of fracture. Rib abnormality questionably seen on shoulder films further evaluated with rib films--no fracture and I agree with that. There was a darker linear area within the [facility] joint space seen on [facility] view that was most likely overlying soft tissue as it was seen as a linear density on the axilary view within the soft tussue, not  within the [facility] joint.  ASPIRATIONINJECTION: Patient was given informed consent, signed copy in the chart. Appropriate time out was taken. Area prepped and draped in usual  sterile fashion. 5 cc 1% lidocaine used for local injection. After anesthesia was obtained a 60 cc urine she fitted on an 18-gauge needle was used to aspirate small amount of fluid from the joint. We only got about 3-5 cc of bloody fluid. We then removed the syringe leaving the 18-gauge needle in the knee joint, replaced a 60 cc syringe with 3 cc syringe containing 1 cc of 40 mg/mL methylprednisolone and injected that into the knee joint. All instruments were then removed. Patient tolerated procedure well. There were no complications. A Band-Aid was applied. Post procedure instructions were given.       Assessment & Plan:  1. Left knee effusion and contusion  Aspiration / injection CSI. Will have her do leg extension against gravity, ICE, elevate, WBAT Patient to ice site Provide quad strengthening exercises 2. Left shoulder pain Secondary to recent trauma, no rotator cuff strength deficit noted. Will treat as strain/contusion - provided internal and external rotation exercises with theraband  Follow-up in 2 weeks to reassess response, caa in interim with new or worsening sx

## 2014-01-11 DIAGNOSIS — N952 Postmenopausal atrophic vaginitis: Secondary | ICD-10-CM | POA: Diagnosis not present

## 2014-01-19 ENCOUNTER — Ambulatory Visit (INDEPENDENT_AMBULATORY_CARE_PROVIDER_SITE_OTHER): Payer: Medicare Other | Admitting: Family Medicine

## 2014-01-19 ENCOUNTER — Other Ambulatory Visit: Payer: Self-pay | Admitting: Cardiology

## 2014-01-19 ENCOUNTER — Encounter (INDEPENDENT_AMBULATORY_CARE_PROVIDER_SITE_OTHER): Payer: Self-pay

## 2014-01-19 ENCOUNTER — Encounter: Payer: Self-pay | Admitting: Family Medicine

## 2014-01-19 VITALS — BP 110/71 | HR 79 | Ht 68.0 in | Wt 147.0 lb

## 2014-01-19 DIAGNOSIS — S8002XS Contusion of left knee, sequela: Secondary | ICD-10-CM

## 2014-01-19 DIAGNOSIS — M25512 Pain in left shoulder: Secondary | ICD-10-CM | POA: Diagnosis not present

## 2014-01-19 MED ORDER — METHYLPREDNISOLONE ACETATE 40 MG/ML IJ SUSP
40.0000 mg | Freq: Once | INTRAMUSCULAR | Status: AC
Start: 2014-01-19 — End: 2014-01-19
  Administered 2014-01-19: 40 mg via INTRA_ARTICULAR

## 2014-01-22 NOTE — Progress Notes (Signed)
Patient ID: Cindy [name], female   DOB: 1933/04/12, 80 y.o.   MRN: [id number]  Cindy [name] - 80 y.o. female MRN [id number]  Date of birth: October 23, 1932    SUBJECTIVE:     Followup left knee and left shoulder injury. Last office visit we aspirated a small amount of bloody fluid from her left knee joint and intact with corticosteroid. At the time she was not able to achieve full extension. Over the interim she has worked on flexion and extension exercises. She's much improved. She still has some stiffness and is not quite walking normally but 75% better.  Followup left shoulder pain. At last office visit we did not pay much attention to this because her knee was a bigger issue. Since then she's had some improvement in her shoulder pain but he continues to be stiff and painful. He she's trying to do any motion of her head. ROS:     She's had no increase in pain of either joint, she has had some bruising show up but no erythema or warmth of either shoulder or knee joint. No fever, sweats, chills. No numbness or tingling that is new for her.  PERTINENT  PMH / PSH FH / / SH:  Past Medical, Surgical, Social, and Family History Reviewed & Updated in the EMR.  Pertinent findings include:  CHF, history of left-sided ulcerative colitis, pulmonary fibrosis, GERD, osteoporosis.  OBJECTIVE: BP 110/71  Pulse 79  Ht 5' 8"  (1.727 m)  Wt 147 lb (66.679 kg)  BMI 22.36 kg/m2  Physical Exam:  Vital signs are reviewed. GENERAL: Well-developed female no acute distress KNEE: left. Still some mild ecchymoses on bilateral knees but no sign of effusion. Left knee now has full range of motion in flexion extension. She has full active range of motion strength 5 out of 5 and extension. Calf is soft. Distally neurovascularly intact. She has no tenderness to palpation of the lateral joint line but mild tenderness to palpation over the anterior medial joint line area appeared. The quadriceps tendon and patellar tendon  are nontender to palpation.  SHOULDER: Left. Painlwith  supraspinatus testing but no weakness. Internal and external rotation range of motion strength is intact. There is no effusion located in the shoulder. Distally she is neurovascularly intact. Biceps tendon is nontender and she has intact biceps strength.  INJECTION: Patient was given informed consent, signed copy in the chart. Appropriate time out was taken. Area prepped and draped in usual sterile fashion. One cc of methylprednisolone 40 mg/ml plus  4 cc of 1% lidocaine without epinephrine was injected into the left subacromial bursa using a(n) posterior approach. The patient tolerated the procedure well. There were no complications. Post procedure instructions were given.   ASSESSMENT & PLAN:  See problem based charting & AVS for pt instructions. I think she's done really great with the knee exercises. We gave her some strengthening exercises specifically short arc quadriceps raises with the goal of doing eventually 100 a day. At that point she can discontinue her home exercise program. Should she have any new or worsening symptoms she'll let us know. #2. Regarding the shoulder, I think she has some subacromial bursitis and would benefit from a corticosteroid injection which we did today. She'll continue with rotator cuff strengthening exercises on that. She can followup when necessary and we would be happy to see her back but I think she's well on the road to recovery.

## 2014-02-02 DIAGNOSIS — Z23 Encounter for immunization: Secondary | ICD-10-CM | POA: Diagnosis not present

## 2014-02-19 ENCOUNTER — Encounter: Payer: Self-pay | Admitting: Family Medicine

## 2014-03-06 NOTE — Progress Notes (Signed)
Cardiac Rehabilitation Program Outcomes Report   Orientation:  05/18/13 Graduate Date:  08/28/13 Discharge Date:  08/28/13 # of sessions completed: 36  Cardiologist: Ottie Glazier Family MD:  Timmothy Euler Time:  0930  A.  Exercise Program:  Tolerates exercise @ 3.73 METS for 15 minutes and Walk Test Results:  Post: 2.81 mets  B.  Mental Health:  Good mental attitude  C.  Education/Instruction/Skills  Accurately checks own pulse.  Rest:  78 Exercise:  97 and Knows THR for exercise  Uses Perceived Exertion Scale and/or Dyspnea Scale  D.  Nutrition/Weight Control/Body Composition:  Adherence to prescribed nutrition program: good    E.  Blood Lipids    Lab Results  Component Value Date   CHOL 242* 12/21/2013   HDL 43.70 12/21/2013   LDLCALC 183* 12/21/2013   TRIG 75.0 12/21/2013   CHOLHDL 6 12/21/2013    F.  Lifestyle Changes:  Making positive lifestyle changes  G.  Symptoms noted with exercise:  Asymptomatic  Report Completed By:  Benay Pillow RN    Comments:  This is patients graduation/discharge note. Patient has finished CR with 36 sessions. Patient plans to join [facility].

## 2014-03-06 NOTE — Addendum Note (Signed)
Encounter addended by: Cathie Olden, RN on: 03/06/2014  4:04 PM<BR>     Documentation filed: Notes Section

## 2014-03-13 DIAGNOSIS — J209 Acute bronchitis, unspecified: Secondary | ICD-10-CM | POA: Diagnosis not present

## 2014-03-13 DIAGNOSIS — I509 Heart failure, unspecified: Secondary | ICD-10-CM | POA: Diagnosis not present

## 2014-03-13 DIAGNOSIS — I10 Essential (primary) hypertension: Secondary | ICD-10-CM | POA: Diagnosis not present

## 2014-03-13 DIAGNOSIS — K5100 Ulcerative (chronic) pancolitis without complications: Secondary | ICD-10-CM | POA: Diagnosis not present

## 2014-03-19 ENCOUNTER — Telehealth: Payer: Self-pay | Admitting: Cardiology

## 2014-03-19 NOTE — Telephone Encounter (Signed)
Called stating she has been weak, having a dry cough, SOB, legs and feet swollen and tightness in chest for over a week.  Saw Dr. Luan Pulling last Tuesday who increased her Lasix to 40 mg daily and K+ to 20 meq daily. Was told by him to come back today if not any better.  She went by their office and there was a lobby full of pts waiting to be seen so she called here.  States her legs and feet down.  Wt has decreased from 146.8 last Tuesday to 143.7 today.  Still has dry cough, weak and has chest tightness.  States Dr. Luan Pulling did not do CXR last week. Spoke w/Lori Gerhardt,NP/flex today who suggests that she stay on Lasix 40 mg and K+ 20 meq and be seen next Monday.  Made her an appointment with Cecille Rubin next Monday at 2:00.  Advised if she continues to have SOB and cough to call our office before next Monday. She verbalizes understanding and will call if not any better.

## 2014-03-19 NOTE — Telephone Encounter (Signed)
New problem    Pt is SOB and weak,

## 2014-03-26 ENCOUNTER — Ambulatory Visit (INDEPENDENT_AMBULATORY_CARE_PROVIDER_SITE_OTHER): Payer: Medicare Other | Admitting: Nurse Practitioner

## 2014-03-26 ENCOUNTER — Ambulatory Visit
Admission: RE | Admit: 2014-03-26 | Discharge: 2014-03-26 | Disposition: A | Discharge status: Home / Self Care | Payer: Medicare Other | Source: Ambulatory Visit | Attending: Nurse Practitioner | Admitting: Nurse Practitioner

## 2014-03-26 ENCOUNTER — Encounter: Payer: Self-pay | Admitting: Nurse Practitioner

## 2014-03-26 VITALS — BP 100/60 | HR 90 | Ht 68.5 in | Wt 145.6 lb

## 2014-03-26 DIAGNOSIS — I5032 Chronic diastolic (congestive) heart failure: Secondary | ICD-10-CM

## 2014-03-26 DIAGNOSIS — J8410 Pulmonary fibrosis, unspecified: Secondary | ICD-10-CM

## 2014-03-26 DIAGNOSIS — R0600 Dyspnea, unspecified: Secondary | ICD-10-CM | POA: Diagnosis not present

## 2014-03-26 DIAGNOSIS — E785 Hyperlipidemia, unspecified: Secondary | ICD-10-CM

## 2014-03-26 LAB — BASIC METABOLIC PANEL
BUN: 8 mg/dL (ref 6–23)
CO2: 27 mEq/L (ref 19–32)
Calcium: 9.6 mg/dL (ref 8.4–10.5)
Chloride: 99 mEq/L (ref 96–112)
Creatinine, Ser: 0.6 mg/dL (ref 0.4–1.2)
GFR: 110.87 mL/min (ref >60.00)
Glucose, Bld: 96 mg/dL (ref 70–99)
Potassium: 4.5 mEq/L (ref 3.5–5.1)
Sodium: 134 mEq/L — ABNORMAL LOW (ref 135–145)

## 2014-03-26 LAB — HEPATIC FUNCTION PANEL
ALT: 13 U/L (ref 0–35)
AST: 23 U/L (ref 0–37)
Albumin: 3.9 g/dL (ref 3.5–5.2)
Alkaline Phosphatase: 106 U/L (ref 39–117)
Bilirubin, Direct: 0.1 mg/dL (ref 0.0–0.3)
Total Bilirubin: 0.9 mg/dL (ref 0.2–1.2)
Total Protein: 7 g/dL (ref 6.0–8.3)

## 2014-03-26 LAB — CBC
HCT: 39.3 % (ref 36.0–46.0)
Hemoglobin: 12.8 g/dL (ref 12.0–15.0)
MCHC: 32.4 g/dL (ref 30.0–36.0)
MCV: 92.2 fl (ref 78.0–100.0)
Platelets: 264 10*3/uL (ref 150.0–400.0)
RBC: 4.27 Mil/uL (ref 3.87–5.11)
RDW: 13.8 % (ref 11.5–15.5)
WBC: 6.6 10*3/uL (ref 4.0–10.5)

## 2014-03-26 LAB — BRAIN NATRIURETIC PEPTIDE: Pro B Natriuretic peptide (BNP): 193 pg/mL — ABNORMAL HIGH (ref 0.0–100.0)

## 2014-03-26 MED ORDER — FUROSEMIDE 40 MG PO TABS: 40.0000 mg | ORAL_TABLET | Freq: Every day | ORAL | Status: DC

## 2014-03-26 NOTE — Progress Notes (Signed)
Cindy [name] Date of Birth: 06-21-1932 Medical Record #[id number]  History of Present Illness: [name] is seen back today for a work in visit. Seen for Dr. Meda Coffee. She is a 80 year old female with HLD, hypothyroidism, ulcerative colitis and history of swelling. No known CAD noted but her FH is significantly + for CAD with her father dying of myocardial infarction in his 83s and multiple brothers dying of myocardial infarction - one of them in at age of 42 and the others at their 92s.   She has had chronic but stable fibrosis on prior chest CT and CXRs.   Seen here back in September of 2015 - had previously had sinus surgery for some dyspnea and swelling. She had been admitted, diagnosed with acute diastolic HF and given lasix with improvement. Was doing well at her last visit with Dr. Meda Coffee.   Called here at the end of November - "Called stating she has been weak, having a dry cough, SOB, legs and feet swollen and tightness in chest for over a week. Saw Dr. Luan Pulling last Tuesday who increased her Lasix to 40 mg daily and K+ to 20 meq daily. Was told by him to come back today if not any better. She went by their office and there was a lobby full of pts waiting to be seen so she called here. States her legs and feet down. Wt has decreased from 146.8 last Tuesday to 143.7 today. Still has dry cough, weak and has chest tightness. States Dr. Luan Pulling did not do CXR last week. Spoke w/Wilhelmine Krogstad,NP/flex today who suggests that she stay on Lasix 40 mg and K+ 20 meq and be seen next Monday. Made her an appointment with Cecille Rubin next Monday at 2:00. Advised if she continues to have SOB and cough to call our office before next Monday. She verbalizes understanding and will call if not any better."  Thus added to my schedule today.   Comes in today. Here alone. She has lots of issues. More swelling in her feet/legs about 2 to 3 weeks ago. She has been short of breath. Little chest tightness.  Cough - lots of phlegm at times - typically white. No recent CXR. Has lost her appetite but still goes out to eat - getting too much salt. Taking some NSAID occasionally as well. Since she increased the Lasix, she has improved but the sporadic productive cough is still present and she feels weak.   Current Outpatient Prescriptions  Medication Sig Dispense Refill  . ALPRAZolam (XANAX) 0.5 MG tablet Take 1 tablet (0.5 mg total) by mouth at bedtime as needed for anxiety. 30 tablet 3  . Ascorbic Acid (VITAMIN C PO) Take 1 tablet by mouth daily.    Marland Kitchen aspirin EC 81 MG tablet Take 81 mg by mouth 4 (four) times a week.     . calcium carbonate (OS-CAL) 600 MG TABS tablet Take 600 mg by mouth daily with breakfast.    . cholecalciferol (VITAMIN D) 1000 UNITS tablet Take 1,000 Units by mouth daily.      Marland Kitchen ESTRING 2 MG vaginal ring Place 2 mg vaginally every 3 (three) months.     . furosemide (LASIX) 20 MG tablet Take 1 tablet (20 mg total) by mouth daily. (Patient taking differently: Take 40 mg by mouth daily. ) 90 tablet 3  . levothyroxine (SYNTHROID, LEVOTHROID) 112 MCG tablet Take 112 mcg by mouth daily before breakfast.    . mesalamine (APRISO) 0.375 G 24 hr  capsule Take by mouth at bedtime. Take 2 capsules by mouth  at bedtime    . metoprolol succinate (TOPROL-XL) 25 MG 24 hr tablet Take 1 tablet by mouth  daily 30 tablet 2  . naproxen sodium (ANAPROX) 220 MG tablet Take 220 mg by mouth daily as needed (pain).    Marland Kitchen omeprazole (PRILOSEC) 20 MG capsule Take 1 capsule (20 mg total) by mouth daily. 30 capsule 0  . potassium chloride (K-DUR) 10 MEQ tablet Take 1 tablet (10 mEq total) by mouth daily. (Patient taking differently: Take 20 mEq by mouth daily. ) 30 tablet 6  . pravastatin (PRAVACHOL) 20 MG tablet Take 1 tablet (20 mg total) by mouth every evening. 30 tablet 3  . vitamin B-12 (CYANOCOBALAMIN) 1000 MCG tablet Take 1,000 mcg by mouth daily.     No current facility-administered medications for this  visit.    Allergies  Allergen Reactions  . Codeine Nausea Only  . Sulfonamide Derivatives Nausea Only    Past Medical History  Diagnosis Date  . Osteoporosis, unspecified   . Edema   . Other and unspecified hyperlipidemia   . Unspecified hypothyroidism   . Diarrhea   . Other and unspecified noninfectious gastroenteritis and colitis(558.9)   . Family history of malignant neoplasm of gastrointestinal tract   . Flatulence, eructation, and gas pain   . GERD (gastroesophageal reflux disease)   . Stricture and stenosis of esophagus   . Esophagitis, unspecified   . Diverticulosis of colon (without mention of hemorrhage)   . Left sided ulcerative colitis   . URI (upper respiratory infection)   . Maxillary sinus mass   . Fibrosis of lung 03/14/2013  . CHF (congestive heart failure)     Past Surgical History  Procedure Laterality Date  . Abdominal hysterectomy    . Cholecystectomy    . Thyroidectomy, partial    . Foot surgery    . Yag laser application Right 7/35/3299    Procedure: YAG LASER APPLICATION;  Surgeon: Elta Guadeloupe T. Gershon Crane, MD;  Location: [facility];  Service: Ophthalmology;  Laterality: Right;    History  Smoking status  . Never Smoker   Smokeless tobacco  . Never Used    History  Alcohol Use No    Family History  Problem Relation Age of Onset  . Colon cancer Mother   . Stomach cancer Sister   . Diabetes Brother   . Coronary artery disease Other   . Diabetes Other     Niece  . Diabetes Son     Review of Systems: The review of systems is per the HPI.  All other systems were reviewed and are negative.  Physical Exam: BP 100/60 mmHg  Pulse 90  Ht 5' 8.5" (1.74 m)  Wt 145 lb 9.6 oz (66.044 kg)  BMI 21.81 kg/m2 Patient is very pleasant and in no acute distress. Little anxious and very talkative. Her weight is down 2 pounds. Skin is warm and dry. Color is normal.  HEENT is unremarkable. Normocephalic/atraumatic. PERRL. Sclera are nonicteric. Neck is supple.  No masses. No JVD. Lungs are clear. Cardiac exam shows a regular rate and rhythm. Abdomen is soft. Extremities are without edema. Gait and ROM are intact. No gross neurologic deficits noted.  Wt Readings from Last 3 Encounters:  03/26/14 145 lb 9.6 oz (66.044 kg)  01/19/14 147 lb (66.679 kg)  01/05/14 147 lb (66.679 kg)    LABORATORY DATA/PROCEDURES:  EKG today with sinus rhythm.   Lab Results  Component  Value Date   WBC 7.4 03/14/2013   HGB 11.6* 03/14/2013   HCT 35.4* 03/14/2013   PLT 271 03/14/2013   GLUCOSE 93 12/21/2013   CHOL 242* 12/21/2013   TRIG 75.0 12/21/2013   HDL 43.70 12/21/2013   LDLCALC 183* 12/21/2013   ALT 11 12/21/2013   AST 18 12/21/2013   NA 132* 12/21/2013   K 4.1 12/21/2013   CL 99 12/21/2013   CREATININE 0.6 12/21/2013   BUN 8 12/21/2013   CO2 28 12/21/2013   TSH 2.60 06/26/2013   HGBA1C 6.1* 03/14/2013    BNP (last 3 results) No results for input(s): PROBNP in the last 8760 hours.  Myoview Impression from December 2014 Exercise Capacity: Poor exercise capacity. BP Response: Normal blood pressure response. Clinical Symptoms: There is dyspnea. ECG Impression: No significant ST segment change suggestive of ischemia. Comparison with Prior Nuclear Study: No images to compare  Overall Impression: Normal stress nuclear study. Limited exercise due to dyspnea  LV Ejection Fraction: 84%. LV Wall Motion: NL LV Function; NL Wall Motion  [place]  Echo Study Conclusions from November 2014  - Left ventricle: The cavity size was normal. Wall thickness was increased in a pattern of mild LVH. Systolic function was normal. The estimated ejection fraction was in the range of 60% to 65%. Wall motion was normal; there were no regional wall motion abnormalities. Features are consistent with a pseudonormal left ventricular filling pattern, with concomitant abnormal relaxation and increased filling pressure (grade 2  diastolic dysfunction). - Aortic valve: There was no stenosis. - Mitral valve: Mild to moderate regurgitation. - Right ventricle: Poorly visualized. The cavity size was normal. Systolic function was normal. - Tricuspid valve: Peak RV-RA gradient: 55m Hg (S). - Pulmonary arteries: PA peak pressure: 361mHg (S). - Inferior vena cava: The vessel was normal in size; the respirophasic diameter changes were in the normal range (= 50%); findings are consistent with normal central venous pressure. Impressions:  - Normal LV size with mild LV hypertrophy. EF 60-65%. Moderate diastolic dysfunction. There appeared to be mild to moderate mitral regurgitation. Normal RV size and systolic function. Borderline pulmonary hypertension.  Assessment / Plan:  1. Dyspnea - most likely multifactorial - patient was not aware of her CT or prior CXR showing fibrosis - will update her CXR. Send for PFTs - may need to see pulmonary. Explained the need for sodium restriction. I have left her on the increased dose of Lasix for now. We are checking BMET, BNP, CBC and HPF here today. She is not fasting today. 2. Cough  3. No appetite  4. HLD - not fasting today - will get her lipids as previously planned  5. Fibrosis - noted on prior CXR/CT - patient was made aware.   Patient is agreeable to this plan and will call if any problems develop in the interim.   LoBurtis JunesRN, ANOsage[address]uGene AutryrAtticaNC  [phone]

## 2014-03-26 NOTE — Patient Instructions (Addendum)
We will be checking the following labs today BMET, HPF, CBC, BNP  Stay on your current medicines including the 40 mg of lasix. - This has been refilled today to both your mail order and your local drug store  We will arrange for PFTs - this is a breathing test  Please go to [facility] to [facility] on the first floor for a chest Xray - you may walk in.   Restrict your salt -  Goal is to have less than 2000 mg per day  Keep your planned follow up for next week.  Call the [place] office at [phone] if you have any questions, problems or concerns.

## 2014-03-28 ENCOUNTER — Other Ambulatory Visit: Payer: Self-pay | Admitting: *Deleted

## 2014-03-28 MED ORDER — FUROSEMIDE 40 MG PO TABS: 40.0000 mg | ORAL_TABLET | Freq: Every day | ORAL | Status: DC

## 2014-03-29 ENCOUNTER — Telehealth: Payer: Self-pay | Admitting: Nurse Practitioner

## 2014-03-29 ENCOUNTER — Telehealth: Payer: Self-pay | Admitting: Gastroenterology

## 2014-03-29 DIAGNOSIS — E785 Hyperlipidemia, unspecified: Secondary | ICD-10-CM

## 2014-03-29 NOTE — Telephone Encounter (Signed)
Dr Deatra Ina Please advise what else to send in for her

## 2014-03-29 NOTE — Telephone Encounter (Signed)
New problem   Pt was told by Cindy [name] that she needed an appt w/ pulmonary physician. Pt need nurse to call her concerning this appt.

## 2014-03-29 NOTE — Telephone Encounter (Signed)
Calling requesting breathing test be done on Mon 12/14 while she is in [place].  Worked appointments out for her to have PFT's in [place] at [facility] on 12/16 at 11:00. She will have fasting LP also done on 12/16 at [facility] in [place]. Will cx labs in our office and reschedule in [place]. Pt aware and verbalizes understanding.

## 2014-03-30 ENCOUNTER — Ambulatory Visit: Payer: Medicare Other | Admitting: Nurse Practitioner

## 2014-03-30 NOTE — Telephone Encounter (Signed)
See if her insurance will cover lialda 2.4 g daily

## 2014-04-02 ENCOUNTER — Other Ambulatory Visit: Payer: Medicare Other

## 2014-04-02 MED ORDER — MESALAMINE 1.2 G PO TBEC: 2.4000 g | DELAYED_RELEASE_TABLET | Freq: Every day | ORAL | Status: DC

## 2014-04-02 NOTE — Telephone Encounter (Signed)
Sent in [place] to her pharmacy  Called patient to inform med sent

## 2014-04-04 ENCOUNTER — Ambulatory Visit (HOSPITAL_COMMUNITY)
Admission: RE | Admit: 2014-04-04 | Discharge: 2014-04-04 | Disposition: A | Discharge status: Home / Self Care | Payer: Medicare Other | Source: Ambulatory Visit | Attending: Nurse Practitioner | Admitting: Nurse Practitioner

## 2014-04-04 ENCOUNTER — Other Ambulatory Visit: Payer: Medicare Other

## 2014-04-04 DIAGNOSIS — E785 Hyperlipidemia, unspecified: Secondary | ICD-10-CM | POA: Diagnosis not present

## 2014-04-04 DIAGNOSIS — R0600 Dyspnea, unspecified: Secondary | ICD-10-CM | POA: Diagnosis not present

## 2014-04-04 DIAGNOSIS — I5032 Chronic diastolic (congestive) heart failure: Secondary | ICD-10-CM | POA: Insufficient documentation

## 2014-04-04 LAB — PULMONARY FUNCTION TEST
DL/VA % pred: 48 %
DL/VA: 2.55 ml/min/mmHg/L
DLCO cor % pred: 48 %
DLCO cor: 14.27 ml/min/mmHg
DLCO unc % pred: 48 %
DLCO unc: 14.27 ml/min/mmHg
FEF 25-75 Post: 1.61 L/sec
FEF 25-75 Pre: 1.25 L/sec
FEF2575-%Change-Post: 28 %
FEF2575-%Pred-Post: 95 %
FEF2575-%Pred-Pre: 74 %
FEV1-%Change-Post: 4 %
FEV1-%Pred-Post: 71 %
FEV1-%Pred-Pre: 68 %
FEV1-Post: 1.68 L
FEV1-Pre: 1.61 L
FEV1FVC-%Change-Post: 8 %
FEV1FVC-%Pred-Pre: 103 %
FEV6-%Change-Post: -3 %
FEV6-%Pred-Post: 67 %
FEV6-%Pred-Pre: 70 %
FEV6-Post: 2.02 L
FEV6-Pre: 2.09 L
FEV6FVC-%Change-Post: 0 %
FEV6FVC-%Pred-Post: 105 %
FEV6FVC-%Pred-Pre: 104 %
FVC-%Change-Post: -3 %
FVC-%Pred-Post: 64 %
FVC-%Pred-Pre: 66 %
FVC-Post: 2.02 L
FVC-Pre: 2.1 L
Post FEV1/FVC ratio: 83 %
Post FEV6/FVC ratio: 100 %
Pre FEV1/FVC ratio: 77 %
Pre FEV6/FVC Ratio: 99 %
RV % pred: 82 %
RV: 2.13 L
TLC % pred: 69 %
TLC: 3.92 L

## 2014-04-04 MED ORDER — ALBUTEROL SULFATE (2.5 MG/3ML) 0.083% IN NEBU
2.5000 mg | INHALATION_SOLUTION | Freq: Once | RESPIRATORY_TRACT | Status: AC
  Administered 2014-04-04: 2.5 mg via RESPIRATORY_TRACT

## 2014-04-06 ENCOUNTER — Encounter: Payer: Self-pay | Admitting: Cardiology

## 2014-04-06 ENCOUNTER — Telehealth: Payer: Self-pay | Admitting: *Deleted

## 2014-04-06 ENCOUNTER — Other Ambulatory Visit: Payer: Self-pay | Admitting: *Deleted

## 2014-04-06 ENCOUNTER — Ambulatory Visit (INDEPENDENT_AMBULATORY_CARE_PROVIDER_SITE_OTHER): Payer: Medicare Other | Admitting: Cardiology

## 2014-04-06 ENCOUNTER — Other Ambulatory Visit (INDEPENDENT_AMBULATORY_CARE_PROVIDER_SITE_OTHER): Payer: Medicare Other | Admitting: *Deleted

## 2014-04-06 VITALS — BP 124/64 | HR 89 | Ht 68.5 in | Wt 147.0 lb

## 2014-04-06 DIAGNOSIS — I270 Primary pulmonary hypertension: Secondary | ICD-10-CM | POA: Diagnosis not present

## 2014-04-06 DIAGNOSIS — I5032 Chronic diastolic (congestive) heart failure: Secondary | ICD-10-CM | POA: Diagnosis not present

## 2014-04-06 DIAGNOSIS — R600 Localized edema: Secondary | ICD-10-CM

## 2014-04-06 DIAGNOSIS — R0602 Shortness of breath: Secondary | ICD-10-CM | POA: Diagnosis not present

## 2014-04-06 DIAGNOSIS — E785 Hyperlipidemia, unspecified: Secondary | ICD-10-CM

## 2014-04-06 DIAGNOSIS — J8410 Pulmonary fibrosis, unspecified: Secondary | ICD-10-CM | POA: Diagnosis not present

## 2014-04-06 DIAGNOSIS — I272 Pulmonary hypertension, unspecified: Secondary | ICD-10-CM

## 2014-04-06 LAB — LIPID PANEL
Cholesterol: 201 mg/dL — ABNORMAL HIGH (ref 0–200)
HDL: 39.3 mg/dL (ref >39.00)
LDL Cholesterol: 147 mg/dL — ABNORMAL HIGH (ref 0–99)
NonHDL: 161.7
Total CHOL/HDL Ratio: 5
Triglycerides: 73 mg/dL (ref 0.0–149.0)
VLDL: 14.6 mg/dL (ref 0.0–40.0)

## 2014-04-06 MED ORDER — PRAVASTATIN SODIUM 40 MG PO TABS
40.0000 mg | ORAL_TABLET | Freq: Every evening | ORAL | Status: DC
Start: 2014-04-06 — End: 2014-08-20

## 2014-04-06 MED ORDER — ALBUTEROL SULFATE HFA 108 (90 BASE) MCG/ACT IN AERS: 2.0000 | INHALATION_SPRAY | RESPIRATORY_TRACT | Status: DC | PRN

## 2014-04-06 NOTE — Telephone Encounter (Signed)
Informed the pt that per Dr Meda Coffee her LDL is elevated and she recommends we increase the pts pravastatin to 40 mg po daily.  Confirmed the pharmacy of choice with the pt.  Pt verbalized understanding and agrees with this plan.

## 2014-04-06 NOTE — Telephone Encounter (Signed)
-----   Message from Dorothy Spark, MD sent at 04/06/2014  4:09 PM EST ----- Her LDL is still elevated, I would increase pravastatin to 40 mg po daily.

## 2014-04-06 NOTE — Progress Notes (Signed)
Patient ID: Cindy [name], female   DOB: 16-Jul-1932, 80 y.o.   MRN: [id number]   Cindy [name] Date of Birth: [birth year] Medical Record #[id number]  History of Present Illness: Cindy [name] is seen back today for a work in visit. Seen for Dr. Meda Coffee. She is a 80 year old female with HLD, hypothyroidism, ulcerative colitis and history of swelling. No known CAD noted but her FH is significantly + for CAD with her father dying of myocardial infarction in his 86s and multiple brothers dying of myocardial infarction - one of them in at age of 55 and the others at their 37s.   She has had chronic but stable fibrosis on prior chest CT and CXRs.   Seen here back in September of 2015 - had previously had sinus surgery for some dyspnea and swelling. She had been admitted, diagnosed with acute diastolic HF and given lasix with improvement. Was doing well at her last visit with Dr. Meda Coffee.   Called here at the end of November - "Called stating she has been weak, having a dry cough, SOB, legs and feet swollen and tightness in chest for over a week. Saw Dr. Luan Pulling last Tuesday who increased her Lasix to 40 mg daily and K+ to 20 meq daily. Was told by him to come back today if not any better. She went by their office and there was a lobby full of pts waiting to be seen so she called here. States her legs and feet down. Wt has decreased from 146.8 last Tuesday to 143.7 today. Still has dry cough, weak and has chest tightness. States Dr. Luan Pulling did not do CXR last week. Spoke w/Lori Gerhardt,NP/flex today who suggests that she stay on Lasix 40 mg and K+ 20 meq and be seen next Monday. Made her an appointment with Cecille Rubin next Monday at 2:00. Advised if she continues to have SOB and cough to call our office before next Monday. She verbalizes understanding and will call if not any better."  Thus added to my schedule today.   Comes in today. Here alone. She has lots of issues. More swelling in her feet/legs  about 2 to 3 weeks ago. She has been short of breath. Little chest tightness. Cough - lots of phlegm at times - typically white. No recent CXR. Has lost her appetite but still goes out to eat - getting too much salt. Taking some NSAID occasionally as well. Since she increased the Lasix, she has improved but the sporadic productive cough is still present and she feels weak.   03/07/2014 - the patient states that Lasix restarted resolved her lower extremity edema however she still experiencing significant shortness of breath and fatigue after minimal exertion. No chest pain. She underwent pulmonary function test that was abnormal. She states that with breathing treatment and using of bronchodilators give her significant relief at the pulmonary function test. Her cough is nonproductive. No palpitations or syncope.  Current Outpatient Prescriptions  Medication Sig Dispense Refill  . acetaminophen (TYLENOL) 325 MG tablet Take 325 mg by mouth as needed for mild pain or headache.    . ALPRAZolam (XANAX) 0.5 MG tablet Take 1 tablet (0.5 mg total) by mouth at bedtime as needed for anxiety. 30 tablet 3  . Ascorbic Acid (VITAMIN C PO) Take 1 tablet by mouth daily.    Marland Kitchen aspirin EC 81 MG tablet Take 81 mg by mouth 4 (four) times a week.     . calcium carbonate (OS-CAL) 600 MG  TABS tablet Take 600 mg by mouth daily with breakfast.    . cholecalciferol (VITAMIN D) 1000 UNITS tablet Take 1,000 Units by mouth daily.      Marland Kitchen ESTRING 2 MG vaginal ring Place 2 mg vaginally every 3 (three) months.     . furosemide (LASIX) 40 MG tablet Take 1 tablet (40 mg total) by mouth daily. 90 tablet 3  . levothyroxine (SYNTHROID, LEVOTHROID) 112 MCG tablet Take 112 mcg by mouth daily before breakfast.    . mesalamine (APRISO) 0.375 G 24 hr capsule Take by mouth at bedtime. Take 2 capsules by mouth  at bedtime    . mesalamine (LIALDA) 1.2 G EC tablet Take 2 tablets (2.4 g total) by mouth daily with breakfast. 60 tablet 3  .  metoprolol succinate (TOPROL-XL) 25 MG 24 hr tablet Take 1 tablet by mouth  daily 30 tablet 2  . omeprazole (PRILOSEC) 20 MG capsule Take 1 capsule (20 mg total) by mouth daily. 30 capsule 0  . potassium chloride (K-DUR) 10 MEQ tablet Take 1 tablet (10 mEq total) by mouth daily. (Patient taking differently: Take 20 mEq by mouth daily. ) 30 tablet 6  . pravastatin (PRAVACHOL) 20 MG tablet Take 1 tablet (20 mg total) by mouth every evening. 30 tablet 3   No current facility-administered medications for this visit.    Allergies  Allergen Reactions  . Codeine Nausea Only  . Sulfonamide Derivatives Nausea Only    Past Medical History  Diagnosis Date  . Osteoporosis, unspecified   . Edema   . Other and unspecified hyperlipidemia   . Unspecified hypothyroidism   . Diarrhea   . Other and unspecified noninfectious gastroenteritis and colitis(558.9)   . Family history of malignant neoplasm of gastrointestinal tract   . Flatulence, eructation, and gas pain   . GERD (gastroesophageal reflux disease)   . Stricture and stenosis of esophagus   . Esophagitis, unspecified   . Diverticulosis of colon (without mention of hemorrhage)   . Left sided ulcerative colitis   . URI (upper respiratory infection)   . Maxillary sinus mass   . Fibrosis of lung 03/14/2013  . CHF (congestive heart failure)     Past Surgical History  Procedure Laterality Date  . Abdominal hysterectomy    . Cholecystectomy    . Thyroidectomy, partial    . Foot surgery    . Yag laser application Right 01/11/2682    Procedure: YAG LASER APPLICATION;  Surgeon: Elta Guadeloupe T. Gershon Crane, MD;  Location: [facility];  Service: Ophthalmology;  Laterality: Right;    History  Smoking status  . Never Smoker   Smokeless tobacco  . Never Used    History  Alcohol Use No    Family History  Problem Relation Age of Onset  . Colon cancer Mother   . Stomach cancer Sister   . Diabetes Brother   . Coronary artery disease Other   . Diabetes  Other     Niece  . Diabetes Son     Review of Systems: The review of systems is per the HPI.  All other systems were reviewed and are negative.  Physical Exam: BP 124/64 mmHg  Pulse 89  Ht 5' 8.5" (1.74 m)  Wt 147 lb (66.679 kg)  BMI 22.02 kg/m2 Patient is very pleasant and in no acute distress. Little anxious and very talkative. Her weight is down 2 pounds. Skin is warm and dry. Color is normal.  HEENT is unremarkable. Normocephalic/atraumatic. PERRL. Sclera are nonicteric. Neck  is supple. No masses. No JVD. Lungs are clear. Cardiac exam shows a regular rate and rhythm. Abdomen is soft. Extremities are without edema. Gait and ROM are intact. No gross neurologic deficits noted.  Wt Readings from Last 3 Encounters:  04/06/14 147 lb (66.679 kg)  03/26/14 145 lb 9.6 oz (66.044 kg)  01/19/14 147 lb (66.679 kg)    LABORATORY DATA/PROCEDURES:  EKG today with sinus rhythm.   Lab Results  Component Value Date   WBC 6.6 03/26/2014   HGB 12.8 03/26/2014   HCT 39.3 03/26/2014   PLT 264.0 03/26/2014   GLUCOSE 96 03/26/2014   CHOL 242* 12/21/2013   TRIG 75.0 12/21/2013   HDL 43.70 12/21/2013   LDLCALC 183* 12/21/2013   ALT 13 03/26/2014   AST 23 03/26/2014   NA 134* 03/26/2014   K 4.5 03/26/2014   CL 99 03/26/2014   CREATININE 0.6 03/26/2014   BUN 8 03/26/2014   CO2 27 03/26/2014   TSH 2.60 06/26/2013   HGBA1C 6.1* 03/14/2013    BNP (last 3 results)  Recent Labs  03/26/14 1227  PROBNP 193.0*    Myoview Impression from December 2014 Exercise Capacity: Poor exercise capacity. BP Response: Normal blood pressure response. Clinical Symptoms: There is dyspnea. ECG Impression: No significant ST segment change suggestive of ischemia. Comparison with Prior Nuclear Study: No images to compare  Overall Impression: Normal stress nuclear study. Limited exercise due to dyspnea  LV Ejection Fraction: 84%. LV Wall Motion: NL LV Function; NL Wall Motion  [place]  Echo Study Conclusions from November 2014  - Left ventricle: The cavity size was normal. Wall thickness was increased in a pattern of mild LVH. Systolic function was normal. The estimated ejection fraction was in the range of 60% to 65%. Wall motion was normal; there were no regional wall motion abnormalities. Features are consistent with a pseudonormal left ventricular filling pattern, with concomitant abnormal relaxation and increased filling pressure (grade 2 diastolic dysfunction). - Aortic valve: There was no stenosis. - Mitral valve: Mild to moderate regurgitation. - Right ventricle: Poorly visualized. The cavity size was normal. Systolic function was normal. - Tricuspid valve: Peak RV-RA gradient: 63m Hg (S). - Pulmonary arteries: PA peak pressure: 335mHg (S). - Inferior vena cava: The vessel was normal in size; the respirophasic diameter changes were in the normal range (= 50%); findings are consistent with normal central venous pressure. Impressions:  - Normal LV size with mild LV hypertrophy. EF 60-65%. Moderate diastolic dysfunction. There appeared to be mild to moderate mitral regurgitation. Normal RV size and systolic function. Borderline pulmonary hypertension.  Assessment / Plan:  1. Dyspnea/cough - appears to be related to underlying pulmonary fibrosis, despite no improvement in numbers after bronchodilators during PFTs, the patient felt significant subjective improvement. We will refer her to pulmonology and start albuterol inhaler PRN. We will also switch metoprolol to Nebivolol (Bystolic 5 mg PO BID). This will also help with mild pulmonary hypertension  2. Chronic diastolic CHF, LE edema - continue lasix 40 mg po daily   3. HLD - not fasting today - will get her lipids as previously planned  4. Fibrosis - noted on prior CXR/CT - patient was made aware.   Follow up in 3 months.  NEDorothy Spark2/18/2015

## 2014-04-06 NOTE — Patient Instructions (Addendum)
Your physician recommends that you schedule a follow-up appointment in:   [place] have been referred to PULMONARY Your physician has recommended you make the following change in your medication:  STOP   METOPROLOL START  BYSTOLIC  5 MG  TWICE  DAILY

## 2014-04-11 DIAGNOSIS — I509 Heart failure, unspecified: Secondary | ICD-10-CM | POA: Diagnosis not present

## 2014-04-11 DIAGNOSIS — R5383 Other fatigue: Secondary | ICD-10-CM | POA: Diagnosis not present

## 2014-04-11 DIAGNOSIS — R0600 Dyspnea, unspecified: Secondary | ICD-10-CM | POA: Diagnosis not present

## 2014-04-11 DIAGNOSIS — R0602 Shortness of breath: Secondary | ICD-10-CM | POA: Diagnosis not present

## 2014-04-11 DIAGNOSIS — R634 Abnormal weight loss: Secondary | ICD-10-CM | POA: Diagnosis not present

## 2014-04-12 ENCOUNTER — Institutional Professional Consult (permissible substitution): Payer: Medicare Other | Admitting: Internal Medicine

## 2014-04-17 ENCOUNTER — Ambulatory Visit (INDEPENDENT_AMBULATORY_CARE_PROVIDER_SITE_OTHER): Payer: Medicare Other | Admitting: Internal Medicine

## 2014-04-17 ENCOUNTER — Other Ambulatory Visit: Payer: Self-pay | Admitting: *Deleted

## 2014-04-17 ENCOUNTER — Other Ambulatory Visit (INDEPENDENT_AMBULATORY_CARE_PROVIDER_SITE_OTHER): Payer: Medicare Other

## 2014-04-17 ENCOUNTER — Encounter: Payer: Self-pay | Admitting: Internal Medicine

## 2014-04-17 VITALS — BP 104/60 | HR 82 | Temp 98.1°F | Ht 68.0 in | Wt 151.6 lb

## 2014-04-17 DIAGNOSIS — J8410 Pulmonary fibrosis, unspecified: Secondary | ICD-10-CM

## 2014-04-17 DIAGNOSIS — N952 Postmenopausal atrophic vaginitis: Secondary | ICD-10-CM | POA: Diagnosis not present

## 2014-04-17 DIAGNOSIS — R0600 Dyspnea, unspecified: Secondary | ICD-10-CM | POA: Diagnosis not present

## 2014-04-17 DIAGNOSIS — E039 Hypothyroidism, unspecified: Secondary | ICD-10-CM

## 2014-04-17 DIAGNOSIS — E785 Hyperlipidemia, unspecified: Secondary | ICD-10-CM

## 2014-04-17 DIAGNOSIS — J9611 Chronic respiratory failure with hypoxia: Secondary | ICD-10-CM | POA: Insufficient documentation

## 2014-04-17 DIAGNOSIS — R0609 Other forms of dyspnea: Secondary | ICD-10-CM | POA: Insufficient documentation

## 2014-04-17 DIAGNOSIS — R05 Cough: Secondary | ICD-10-CM | POA: Diagnosis not present

## 2014-04-17 DIAGNOSIS — N3020 Other chronic cystitis without hematuria: Secondary | ICD-10-CM | POA: Diagnosis not present

## 2014-04-17 DIAGNOSIS — R058 Other specified cough: Secondary | ICD-10-CM

## 2014-04-17 DIAGNOSIS — M810 Age-related osteoporosis without current pathological fracture: Secondary | ICD-10-CM

## 2014-04-17 LAB — CBC WITH DIFFERENTIAL/PLATELET
Basophils Absolute: 0.1 10*3/uL (ref 0.0–0.1)
Basophils Relative: 0.8 % (ref 0.0–3.0)
Eosinophils Absolute: 0.2 10*3/uL (ref 0.0–0.7)
Eosinophils Relative: 2.4 % (ref 0.0–5.0)
HCT: 39.4 % (ref 36.0–46.0)
Hemoglobin: 12.8 g/dL (ref 12.0–15.0)
Lymphocytes Relative: 21.2 % (ref 12.0–46.0)
Lymphs Abs: 1.5 10*3/uL (ref 0.7–4.0)
MCHC: 32.5 g/dL (ref 30.0–36.0)
MCV: 92.7 fl (ref 78.0–100.0)
Monocytes Absolute: 0.5 10*3/uL (ref 0.1–1.0)
Monocytes Relative: 7.3 % (ref 3.0–12.0)
Neutro Abs: 4.9 10*3/uL (ref 1.4–7.7)
Neutrophils Relative %: 68.3 % (ref 43.0–77.0)
Platelets: 245 10*3/uL (ref 150.0–400.0)
RBC: 4.25 Mil/uL (ref 3.87–5.11)
RDW: 13.8 % (ref 11.5–15.5)
WBC: 7.2 10*3/uL (ref 4.0–10.5)

## 2014-04-17 LAB — SEDIMENTATION RATE: Sed Rate: 30 mm/hr — ABNORMAL HIGH (ref 0–22)

## 2014-04-17 LAB — TSH: TSH: 3.83 u[IU]/mL (ref 0.35–4.50)

## 2014-04-17 MED ORDER — PREDNISONE 10 MG PO TABS: ORAL_TABLET | ORAL | Status: DC

## 2014-04-17 MED ORDER — LEVOTHYROXINE SODIUM 112 MCG PO TABS: 112.0000 ug | ORAL_TABLET | Freq: Every day | ORAL | Status: DC

## 2014-04-17 NOTE — Progress Notes (Signed)
Quick Note:  Spoke with pt and notified of results per Dr. Wert. Pt verbalized understanding and denied any questions.  ______ 

## 2014-04-17 NOTE — Patient Instructions (Addendum)
Prilosec Take 30- 60 min before your first and last meals of the day   GERD (REFLUX)  is an extremely common cause of respiratory symptoms just like yours , many times with no obvious heartburn at all.    It can be treated with medication, but also with lifestyle changes including avoidance of late meals, excessive alcohol, smoking cessation, and avoid fatty foods, chocolate, peppermint, colas, red wine, and acidic juices such as orange juice.  NO MINT OR MENTHOL PRODUCTS SO NO COUGH DROPS  USE SUGARLESS CANDY INSTEAD (Jolley ranchers or Stover's or Life Savers) or even ice chips will also do - the key is to swallow to prevent all throat clearing. NO OIL BASED VITAMINS - use powdered substitutes.   Prednisone 10 mg take  4 each am x 2 days,   2 each am x 2 days,  1 each am x 2 days and stop   Please remember to go to the lab  department downstairs for your tests - we will call you with the results when they are available.  Please schedule a follow up office visit in 2 weeks, sooner if needed

## 2014-04-17 NOTE — Progress Notes (Signed)
Subjective:     Patient ID: Cindy [name], female   DOB: 02-16-1933,    MRN: [id number]  HPI  22 yowf never smoker perfectly healthy until around 2013 sense of throat drainage sinus sugery in 02/2013 > with coughing daily since referred by K nelson for sob to pulmonary clinic 04/17/2014    04/17/2014 1st [facility] office visit/ Cindy [name]   Chief Complaint  Patient presents with  . Advice Only    Referred by Dr. Meda Coffee; SOB; DOE;  Weakness; fatigue  indolent onset persistent daily sense of drainage x 2 years> much worse p sinus Surgey and then assoc with  breathing difficulty > 100% better while on prednisone and for  one week  p stops, No better on clariton, and not on inhalers of any kind  Drainage daytime not noct assoc with minimal excess mucus, white.does not wake her up  Walk x across parking then give out, no progression x 6 m UC good control / no exp to amiodarone or macrodantin she can recall   Seen by Dr Kenton Kingfisher 04/06/14 for leg swelling that resolved on lasix but did not help her breathing/ cough and subjective response to saba so changed to bystolic   No obvious day to day or daytime variabilty or assoc  cp or chest tightness, subjective wheeze overt sinus or hb symptoms. No unusual exp hx or h/o childhood pna/ asthma or knowledge of premature birth.  Sleeping ok without nocturnal  or early am exacerbation  of respiratory  c/o's or need for noct saba. Also denies any obvious fluctuation of symptoms with weather or environmental changes or other aggravating or alleviating factors except as outlined above   Current Medications, Allergies, Complete Past Medical History, Past Surgical History, Family History, and Social History were reviewed in [employer] record.  ROS  The following are not active complaints unless bolded sore throat, dysphagia, dental problems, itching, sneezing,  nasal congestion or excess/ purulent secretions, ear ache,   fever,  chills, sweats, unintended wt loss, pleuritic or exertional cp, hemoptysis,  orthopnea pnd or leg swelling improved on lasix, presyncope, palpitations, heartburn, abdominal pain, anorexia, nausea, vomiting, diarrhea  or change in bowel or urinary habits, change in stools or urine, dysuria,hematuria,  rash, arthralgias, visual complaints, headache, numbness weakness or ataxia or problems with walking or coordination,  change in mood/affect or memory.       Review of Systems     Objective:   Physical Exam     Wt Readings from Last 3 Encounters:  04/17/14 151 lb 9.6 oz (68.765 kg)  04/06/14 147 lb (66.679 kg)  03/26/14 145 lb 9.6 oz (66.044 kg)    Vital signs reviewed   HEENT: nl dentition, turbinates, and orophanx. Nl external ear canals without cough reflex   NECK :  without JVD/Nodes/TM/ nl carotid upstrokes bilaterally   LUNGS: no acc muscle use,  Very min insp crackles bases bilaterally  without cough on insp or exp maneuvers   CV:  RRR  no s3 or murmur or increase in P2, no edema   ABD:  soft and nontender with nl excursion in the supine position. No bruits or organomegaly, bowel sounds nl  MS:  warm without deformities, calf tenderness, cyanosis or clubbing  SKIN: warm and dry without lesions    NEURO:  alert, approp, no deficits   03/26/14  cxr Stable chronic interstitial prominence and bronchitic changes. No acute infiltrate or pulmonary edema     Chemistry  Component Value Date/Time   NA 134* 03/26/2014 1227   K 4.5 03/26/2014 1227   CL 99 03/26/2014 1227   CO2 27 03/26/2014 1227   BUN 8 03/26/2014 1227   CREATININE 0.6 03/26/2014 1227   CREATININE 0.61 07/15/2012 1107      Component Value Date/Time   CALCIUM 9.6 03/26/2014 1227   ALKPHOS 106 03/26/2014 1227   AST 23 03/26/2014 1227   ALT 13 03/26/2014 1227   BILITOT 0.9 03/26/2014 1227         Recent Labs Lab 04/17/14 1009  HGB 12.8  HCT 39.4  WBC 7.2  PLT 245.0     Lab Results   Component Value Date   TSH 3.83 04/17/2014     Lab Results  Component Value Date   PROBNP 193.0* 03/26/2014     Lab Results  Component Value Date   ESRSEDRATE 30* 04/17/2014        Assessment:

## 2014-04-18 LAB — ALLERGY FULL PROFILE
Allergen, D pternoyssinus,d7: <0.1 kU/L
Allergen,Goose feathers, e70: <0.1 kU/L
Alternaria Alternata: <0.1 kU/L
Aspergillus fumigatus, m3: <0.1 kU/L
Bahia Grass: <0.1 kU/L
Bermuda Grass: <0.1 kU/L
Box Elder IgE: <0.1 kU/L
Candida Albicans: <0.1 kU/L
Cat Dander: <0.1 kU/L
Common Ragweed: <0.1 kU/L
Curvularia lunata: <0.1 kU/L
D. farinae: <0.1 kU/L
Dog Dander: <0.1 kU/L
Elm IgE: <0.1 kU/L
Fescue: <0.1 kU/L
G005 Rye, Perennial: <0.1 kU/L
G009 Red Top: <0.1 kU/L
Goldenrod: <0.1 kU/L
Helminthosporium halodes: <0.1 kU/L
House Dust Hollister: <0.1 kU/L
IgE (Immunoglobulin E), Serum: 68 kU/L (ref <115)
Lamb's Quarters: <0.1 kU/L
Oak: <0.1 kU/L
Plantain: <0.1 kU/L
Stemphylium Botryosum: <0.1 kU/L
Sycamore Tree: <0.1 kU/L
Timothy Grass: <0.1 kU/L

## 2014-04-18 NOTE — Progress Notes (Signed)
Quick Note:  Spoke with pt and notified of results per Dr. Wert. Pt verbalized understanding and denied any questions.  ______ 

## 2014-04-21 DIAGNOSIS — R05 Cough: Secondary | ICD-10-CM | POA: Insufficient documentation

## 2014-04-21 DIAGNOSIS — R058 Other specified cough: Secondary | ICD-10-CM | POA: Insufficient documentation

## 2014-04-21 NOTE — Assessment & Plan Note (Signed)
The most common causes of chronic cough in immunocompetent adults include the following: upper airway cough syndrome (UACS), previously referred to as postnasal drip syndrome (PNDS), which is caused by variety of rhinosinus conditions; (2) asthma; (3) GERD; (4) chronic bronchitis from cigarette smoking or other inhaled environmental irritants; (5) nonasthmatic eosinophilic bronchitis; and (6) bronchiectasis.   These conditions, singly or in combination, have accounted for up to 94% of the causes of chronic cough in prospective studies.   Other conditions have constituted no >6% of the causes in prospective studies These have included bronchogenic carcinoma, chronic interstitial pneumonia, sarcoidosis, left ventricular failure, ACEI-induced cough, and aspiration from a condition associated with pharyngeal dysfunction.    Chronic cough is often simultaneously caused by more than one condition. A single cause has been found from 38 to 82% of the time, multiple causes from 18 to 62%. Multiply caused cough has been the result of three diseases up to 42% of the time.       Response to prednisone strongly suggests cough variant asthma or eos rhinitis/sinusitis > will rechallenge x 6 days then regroup.  See instructions for specific recommendations which were reviewed directly with the patient who was given a copy with highlighter outlining the key components.

## 2014-04-21 NOTE — Assessment & Plan Note (Addendum)
Not able to reproduce this chronic complaint in office today even though she said she experienced walking in   Symptoms are markedly disproportionate to objective findings and not clear this is a lung problem but pt does appear to have difficult airway management issues. DDX of  difficult airways management all start with A and  include Adherence, Ace Inhibitors, Acid Reflux, Active Sinus Disease, Alpha 1 Antitripsin deficiency, Anxiety masquerading as Airways dz,  ABPA,  allergy(esp in young), Aspiration (esp in elderly), Adverse effects of DPI,  Active smokers, plus two Bs  = Bronchiectasis and Beta blocker use..and one C= CHF  Adherence is always the initial "prime suspect" and is a multilayered concern that requires a "trust but verify" approach in every patient - starting with knowing how to use medications, especially inhalers, correctly, keeping up with refills and understanding the fundamental difference between maintenance and prns vs those medications only taken for a very short course and then stopped and not refilled.   ? Acid (or non-acid) GERD > always difficult to exclude as up to 75% of pts in some series report no assoc GI/ Heartburn symptoms> rec max (24h)  acid suppression and diet restrictions/ reviewed and instructions given in writing.   ? Anxiety > usually dx of exclusion but much higher based on interview, exam and observed walking test  ? Allergy / asthma > would be unusual at her age having never had it before > Prednisone 10 mg take  4 each am x 2 days,   2 each am x 2 days,  1 each am x 2 days and stop   ? Active sinus dz > note symptoms worsened p sinus surgery > low threshold to repeat sinus CT   ? chf > excluded with bnp so low though does have h/o diastolic G2 chf by echo one year ago >agree with bisoprolol here  See instructions for specific recommendations which were reviewed directly with the patient who was given a copy with highlighter outlining the key  components.

## 2014-04-21 NOTE — Assessment & Plan Note (Addendum)
Present on cxr's dating back to at least 2011 - pfts 04/04/14  VC  2.10 (66%) no airflow obst - 04/17/14  ESR 30  Walked RA  2 laps @ 185 ft each stopped due to  Fatigue, slow pace, no desat   She has probably had previous acute lung injury rather than a progressive fibrosis illness but note assoc with UC could be due to the dz or due to the meds used for the dz > will need serial walking sats/ esr's for multiple points on the curve and keep as dry as possible so as not to confuse with pulmonary edema.   She and her family did not appear happy with my assessment because I could not give them a precise look into the future for her condition and I explained in as great detail why this wasn't possible and the "points on the curve" concept but if they are not happy with this, having already been followed by Dr Luan Pulling for 4 yrs and upset with my first opportunity to help her, she needs to be referred to [facility].

## 2014-04-24 ENCOUNTER — Telehealth: Payer: Self-pay | Admitting: Cardiology

## 2014-04-24 NOTE — Telephone Encounter (Signed)
That's fine with me, I am glad to hear that she feels better. KN

## 2014-04-24 NOTE — Telephone Encounter (Signed)
New MEssage  Pt wanted to speak with Rn about medication. Please call back and discuss.

## 2014-04-24 NOTE — Telephone Encounter (Signed)
Pt calling to inform Dr Meda Coffee that she tried the Bystolic samples for awhile but she felt like she could not tolerate this medication as well as the metoprolol succinate.  Pt states when she started taking this med it caused her extreme fatigue, weakness, and increased her sob.  Pt states she took herself off of this before Christmas and started back on her old regimen of metoprolol succinate 25 mg po daily, and states she feels much better now.  Pt states she apologizes for not notifying Dr Meda Coffee before switching back to metoprolol succinate.  Pt states she would still like for Dr Meda Coffee to advise if this is safe or not.  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  Informed the pt that Dr Meda Coffee is out of the office today, but I will route this message to her for further review and recommendation and follow-up thereafter.  Pt verbalized understanding and agrees with this plan.

## 2014-04-25 NOTE — Telephone Encounter (Signed)
Informed the pt that per Dr Meda Coffee she is fine with her resuming back the Bystolic and stopping the metoprolol succinate.  Informed the pt to notify our office with any other issues.  Pt verbalized understanding and agrees with this plan.

## 2014-04-30 ENCOUNTER — Other Ambulatory Visit: Payer: Self-pay | Admitting: Cardiology

## 2014-05-01 ENCOUNTER — Encounter: Payer: Self-pay | Admitting: Internal Medicine

## 2014-05-01 ENCOUNTER — Ambulatory Visit (INDEPENDENT_AMBULATORY_CARE_PROVIDER_SITE_OTHER): Payer: Medicare Other | Admitting: Internal Medicine

## 2014-05-01 ENCOUNTER — Other Ambulatory Visit (INDEPENDENT_AMBULATORY_CARE_PROVIDER_SITE_OTHER): Payer: Medicare Other

## 2014-05-01 VITALS — BP 100/60 | HR 73 | Ht 68.0 in | Wt 143.0 lb

## 2014-05-01 DIAGNOSIS — J8410 Pulmonary fibrosis, unspecified: Secondary | ICD-10-CM | POA: Diagnosis not present

## 2014-05-01 DIAGNOSIS — R0600 Dyspnea, unspecified: Secondary | ICD-10-CM

## 2014-05-01 DIAGNOSIS — R058 Other specified cough: Secondary | ICD-10-CM

## 2014-05-01 DIAGNOSIS — R05 Cough: Secondary | ICD-10-CM

## 2014-05-01 DIAGNOSIS — E785 Hyperlipidemia, unspecified: Secondary | ICD-10-CM

## 2014-05-01 LAB — BASIC METABOLIC PANEL
BUN: 11 mg/dL (ref 6–23)
CO2: 24 mEq/L (ref 19–32)
Calcium: 9.5 mg/dL (ref 8.4–10.5)
Chloride: 100 mEq/L (ref 96–112)
Creatinine, Ser: 0.7 mg/dL (ref 0.4–1.2)
GFR: 88.6 mL/min (ref >60.00)
Glucose, Bld: 98 mg/dL (ref 70–99)
Potassium: 4.2 mEq/L (ref 3.5–5.1)
Sodium: 132 mEq/L — ABNORMAL LOW (ref 135–145)

## 2014-05-01 NOTE — Patient Instructions (Addendum)
Please remember to go to the lab  department downstairs for your tests - we will call you with the results when they are available.  Pulmonary follow up is as needed if breathing is what stops you from doing desired activities

## 2014-05-01 NOTE — Progress Notes (Signed)
Subjective:     Patient ID: Cindy [name], female   DOB: 01-14-1933     MRN: [id number]    Brief patient profile:  49 yowf never smoker perfectly healthy until around 2013 sense of throat drainage sinus sugery in 02/2013 >  coughing daily since referred by K nelson for cough/sob to pulmonary clinic 04/17/2014.   History of Present Illness  04/17/2014 1st [facility] office visit/ [place] [facility]   Chief Complaint  Patient presents with  . Advice Only    Referred by Dr. Meda Coffee; SOB; DOE;  Weakness; fatigue  indolent onset persistent daily sense of drainage x 2 years> much worse p sinus Surgey and then assoc with  breathing difficulty > 100% better while on prednisone and for  one week  p stops, No better on clariton, and not on inhalers of any kind  Drainage daytime not noct assoc with minimal excess mucus, white.does not wake her up  Walk x across parking then give out, no progression x 6 m UC good control / no exp to amiodarone or macrodantin she can recall  Seen by Dr Kenton Kingfisher 04/06/14 for leg swelling that resolved on lasix but did not help her breathing/ cough and subjective response to saba so changed to bystolic  rec Prilosec Take 30- 60 min before your first and last meals of the day  GERD  Diet  Prednisone 10 mg take  4 each am x 2 days,   2 each am x 2 days,  1 each am x 2 days and stop      05/01/2014 f/u ov/[facility] Seibold re:  Chief Complaint  Patient presents with  . Follow-up    Pt states that her breathing has improved since her last visit. She states that "I still get exhausted and tired"- walking from room to room at home. She is concerned about loss of appetite and is losing wt unintentionally.   drainage much better  Appetite poor, ? early satiety> more tired than sob    No obvious day to day or daytime variabilty or assoc  cp or chest tightness, subjective wheeze overt sinus or hb symptoms. No unusual exp hx or h/o childhood pna/ asthma or knowledge of premature  birth.  Sleeping ok without nocturnal  or early am exacerbation  of respiratory  c/o's or need for noct saba. Also denies any obvious fluctuation of symptoms with weather or environmental changes or other aggravating or alleviating factors except as outlined above   Current Medications, Allergies, Complete Past Medical History, Past Surgical History, Family History, and Social History were reviewed in [employer] record.  ROS  The following are not active complaints unless bolded sore throat, dysphagia, dental problems, itching, sneezing,  nasal congestion or excess/ purulent secretions, ear ache,   fever, chills, sweats, unintended wt loss, pleuritic or exertional cp, hemoptysis,  orthopnea pnd or leg swelling improved on lasix, presyncope, palpitations, heartburn, abdominal pain, anorexia, nausea, vomiting, diarrhea  or change in bowel or urinary habits, change in stools or urine, dysuria,hematuria,  rash, arthralgias, visual complaints, headache, numbness weakness or ataxia or problems with walking or coordination,  change in mood/affect or memory.             Objective:   Physical Exam  05/01/2014          143  Wt Readings from Last 3 Encounters:  04/17/14 151 lb 9.6 oz (68.765 kg)  04/06/14 147 lb (66.679 kg)  03/26/14 145 lb 9.6 oz (66.044 kg)  Vital signs reviewed   HEENT: nl dentition, turbinates, and orophanx. Nl external ear canals without cough reflex   NECK :  without JVD/Nodes/TM/ nl carotid upstrokes bilaterally   LUNGS: no acc muscle use,  Very min insp crackles bases bilaterally  without cough on insp or exp maneuvers   CV:  RRR  no s3 or murmur or increase in P2, no edema   ABD:  soft and nontender with nl excursion in the supine position. No bruits or organomegaly, bowel sounds nl  MS:  warm without deformities, calf tenderness, cyanosis or clubbing  SKIN: warm and dry without lesions    NEURO:  alert, approp, no deficits    CXR:   03/26/14  I personally reviewed images and agree with radiology impression as follows:   Stable chronic interstitial prominence and bronchitic changes. No acute infiltrate or pulmonary edema     Chemistry      Component Value Date/Time   NA 134* 03/26/2014 1227   K 4.5 03/26/2014 1227   CL 99 03/26/2014 1227   CO2 27 03/26/2014 1227   BUN 8 03/26/2014 1227   CREATININE 0.6 03/26/2014 1227   CREATININE 0.61 07/15/2012 1107      Component Value Date/Time   CALCIUM 9.6 03/26/2014 1227   ALKPHOS 106 03/26/2014 1227   AST 23 03/26/2014 1227   ALT 13 03/26/2014 1227   BILITOT 0.9 03/26/2014 1227         Recent Labs Lab 04/17/14 1009  HGB 12.8  HCT 39.4  WBC 7.2  PLT 245.0     Lab Results  Component Value Date   TSH 3.83 04/17/2014     Lab Results  Component Value Date   PROBNP 193.0* 03/26/2014     Lab Results  Component Value Date   ESRSEDRATE 30* 04/17/2014        Assessment:

## 2014-05-02 NOTE — Progress Notes (Signed)
Quick Note:  Spoke with pt and notified of results per Dr. Wert. Pt verbalized understanding and denied any questions.  ______ 

## 2014-05-03 ENCOUNTER — Encounter: Payer: Self-pay | Admitting: Internal Medicine

## 2014-05-03 NOTE — Assessment & Plan Note (Signed)
Not able to reproduce this "daily" symptom in office setting. Symptoms are markedly disproportionate to objective findings and not clear this is a lung problem but pt does appear to have difficult airway management issues.   ? Acid (or non-acid) GERD > always difficult to exclude as up to 75% of pts in some series report no assoc GI/ Heartburn symptoms> rec continue max (24h)  acid suppression and diet restrictions/ reviewed     ? Anxiety/ depression > at the top of the usual list of suspects at this point > referred back to primary care

## 2014-05-03 NOTE — Assessment & Plan Note (Signed)
-  pfts 04/04/14  VC  2.10 (66%) no airflow obst   DLC0 48%  - 04/17/14  ESR 30  Walked RA  2 laps @ 185 ft each stopped due to  Fatigue, slow pace, no desat  - 05/01/2014  Walked RA x 3 laps @ 185 ft each stopped due to end of study, no desat, legs weak and felt dizzy, slow pace   I had an extended discussion with the patient and fm reviewing all relevant studies completed to date and  lasting 15 to 20 minutes of a 25 minute visit on the following ongoing concerns:  1) she has very mild ILD and does not explain any of her symptoms  2) if she does start to notice that breathing is the limiting problem with exertion we need to see her back for repeat pfts  3) needs primary care eval for FTT with anorexia/ prob gi eval as well as has underlying UC by hx

## 2014-05-03 NOTE — Assessment & Plan Note (Signed)
-   much worse p sinus surgery 2014  - Allergy profile 04/17/14 Eos 2.4   IgE 68 with neg RAST  Improved with rx for gerd, can use 1st gen H1 prn  Pulmonary f/u prn

## 2014-05-04 ENCOUNTER — Telehealth: Payer: Self-pay | Admitting: *Deleted

## 2014-05-04 NOTE — Telephone Encounter (Signed)
Patient also states that her potassium was changed to 34mq by Dr NMeda Coffee I do not see where she changed it, per Dr NFrancesca Omanlast note Dr HLuan Pullingchanged it. Should she call his office to get this refilled? Please advise. Thanks, MI

## 2014-05-04 NOTE — Telephone Encounter (Signed)
Patient stated that she would like to resume the metoprolol. She would like to know if she should resume with her previous dose. Please advise. Thanks, MI

## 2014-05-07 NOTE — Telephone Encounter (Signed)
Last encounter that was advised on the pts med K, was on 11/30, as instructed by Truitt Merle NP to continue taking the Potassium 20 mEq po daily.  Will obtain clarification for potassium chloride being 10 mEq daily or 20 mEq daily by Primary Cardiologist Dr Meda Coffee.  Will route this message appropriately and follow-up with the pt thereafter.

## 2014-05-08 ENCOUNTER — Other Ambulatory Visit (HOSPITAL_COMMUNITY): Payer: Self-pay | Admitting: Pulmonary Disease

## 2014-05-08 DIAGNOSIS — I10 Essential (primary) hypertension: Secondary | ICD-10-CM | POA: Diagnosis not present

## 2014-05-08 DIAGNOSIS — K210 Gastro-esophageal reflux disease with esophagitis: Secondary | ICD-10-CM | POA: Diagnosis not present

## 2014-05-08 DIAGNOSIS — I509 Heart failure, unspecified: Secondary | ICD-10-CM | POA: Diagnosis not present

## 2014-05-08 DIAGNOSIS — R0602 Shortness of breath: Secondary | ICD-10-CM

## 2014-05-08 DIAGNOSIS — K5100 Ulcerative (chronic) pancolitis without complications: Secondary | ICD-10-CM | POA: Diagnosis not present

## 2014-05-09 MED ORDER — POTASSIUM CHLORIDE CRYS ER 20 MEQ PO TBCR: 20.0000 meq | EXTENDED_RELEASE_TABLET | Freq: Every day | ORAL | Status: DC

## 2014-05-09 MED ORDER — METOPROLOL SUCCINATE ER 25 MG PO TB24: 25.0000 mg | ORAL_TABLET | Freq: Every day | ORAL | Status: DC

## 2014-05-09 NOTE — Telephone Encounter (Signed)
Notified the pt that per Dr Meda Coffee she should take potassium 20 mEq po daily, and resume back on the Toprol XL 25 mg po daily in place of the non-tolerated Bystolic.  Confirmed the pharmacy of choice with the pt. Pt verbalized understanding and agrees with this plan.

## 2014-05-09 NOTE — Telephone Encounter (Signed)
20 mEq daily

## 2014-05-10 ENCOUNTER — Ambulatory Visit (HOSPITAL_COMMUNITY)
Admission: RE | Admit: 2014-05-10 | Discharge: 2014-05-10 | Disposition: A | Discharge status: Home / Self Care | Payer: Medicare Other | Source: Ambulatory Visit | Attending: Pulmonary Disease | Admitting: Pulmonary Disease

## 2014-05-10 DIAGNOSIS — I288 Other diseases of pulmonary vessels: Secondary | ICD-10-CM | POA: Diagnosis not present

## 2014-05-10 DIAGNOSIS — R0602 Shortness of breath: Secondary | ICD-10-CM | POA: Insufficient documentation

## 2014-05-10 DIAGNOSIS — J849 Interstitial pulmonary disease, unspecified: Secondary | ICD-10-CM | POA: Diagnosis not present

## 2014-05-10 DIAGNOSIS — J90 Pleural effusion, not elsewhere classified: Secondary | ICD-10-CM | POA: Diagnosis not present

## 2014-05-10 DIAGNOSIS — R05 Cough: Secondary | ICD-10-CM | POA: Diagnosis not present

## 2014-05-10 DIAGNOSIS — R0789 Other chest pain: Secondary | ICD-10-CM | POA: Diagnosis not present

## 2014-05-10 MED ORDER — IOHEXOL 300 MG/ML  SOLN
80.0000 mL | Freq: Once | INTRAMUSCULAR | Status: AC | PRN
  Administered 2014-05-10: 80 mL via INTRAVENOUS

## 2014-05-10 MED ORDER — SODIUM CHLORIDE 0.9 % IJ SOLN
INTRAMUSCULAR | Status: AC
  Filled 2014-05-10: qty 750

## 2014-05-10 MED ORDER — SODIUM CHLORIDE 0.9 % IJ SOLN
INTRAMUSCULAR | Status: AC
  Filled 2014-05-10: qty 30

## 2014-05-11 ENCOUNTER — Ambulatory Visit (HOSPITAL_COMMUNITY): Payer: Medicare Other

## 2014-05-23 DIAGNOSIS — Z09 Encounter for follow-up examination after completed treatment for conditions other than malignant neoplasm: Secondary | ICD-10-CM | POA: Diagnosis not present

## 2014-05-23 DIAGNOSIS — N63 Unspecified lump in breast: Secondary | ICD-10-CM | POA: Diagnosis not present

## 2014-05-23 DIAGNOSIS — Z803 Family history of malignant neoplasm of breast: Secondary | ICD-10-CM | POA: Diagnosis not present

## 2014-05-28 DIAGNOSIS — R918 Other nonspecific abnormal finding of lung field: Secondary | ICD-10-CM | POA: Diagnosis not present

## 2014-05-28 DIAGNOSIS — J984 Other disorders of lung: Secondary | ICD-10-CM | POA: Diagnosis not present

## 2014-05-28 DIAGNOSIS — J90 Pleural effusion, not elsewhere classified: Secondary | ICD-10-CM | POA: Diagnosis not present

## 2014-05-29 ENCOUNTER — Other Ambulatory Visit: Payer: Self-pay | Admitting: Radiology

## 2014-05-29 DIAGNOSIS — N63 Unspecified lump in breast: Secondary | ICD-10-CM | POA: Diagnosis not present

## 2014-05-29 DIAGNOSIS — D242 Benign neoplasm of left breast: Secondary | ICD-10-CM | POA: Diagnosis not present

## 2014-06-12 ENCOUNTER — Encounter: Payer: Self-pay | Admitting: Gastroenterology

## 2014-06-26 DIAGNOSIS — I5030 Unspecified diastolic (congestive) heart failure: Secondary | ICD-10-CM | POA: Insufficient documentation

## 2014-06-26 DIAGNOSIS — J849 Interstitial pulmonary disease, unspecified: Secondary | ICD-10-CM | POA: Diagnosis not present

## 2014-06-26 DIAGNOSIS — R918 Other nonspecific abnormal finding of lung field: Secondary | ICD-10-CM | POA: Diagnosis not present

## 2014-06-26 DIAGNOSIS — I509 Heart failure, unspecified: Secondary | ICD-10-CM | POA: Insufficient documentation

## 2014-06-26 DIAGNOSIS — R0609 Other forms of dyspnea: Secondary | ICD-10-CM | POA: Diagnosis not present

## 2014-06-27 DIAGNOSIS — I361 Nonrheumatic tricuspid (valve) insufficiency: Secondary | ICD-10-CM | POA: Diagnosis not present

## 2014-06-27 DIAGNOSIS — R931 Abnormal findings on diagnostic imaging of heart and coronary circulation: Secondary | ICD-10-CM | POA: Diagnosis not present

## 2014-06-27 DIAGNOSIS — I509 Heart failure, unspecified: Secondary | ICD-10-CM | POA: Diagnosis not present

## 2014-06-28 ENCOUNTER — Ambulatory Visit (INDEPENDENT_AMBULATORY_CARE_PROVIDER_SITE_OTHER): Payer: Medicare Other | Admitting: Cardiology

## 2014-06-28 ENCOUNTER — Encounter: Payer: Self-pay | Admitting: Cardiology

## 2014-06-28 VITALS — BP 124/66 | HR 92 | Ht 68.0 in | Wt 145.0 lb

## 2014-06-28 DIAGNOSIS — R0609 Other forms of dyspnea: Secondary | ICD-10-CM

## 2014-06-28 DIAGNOSIS — I5032 Chronic diastolic (congestive) heart failure: Secondary | ICD-10-CM | POA: Diagnosis not present

## 2014-06-28 DIAGNOSIS — R0600 Dyspnea, unspecified: Secondary | ICD-10-CM

## 2014-06-28 NOTE — Patient Instructions (Signed)
Your physician recommends that you continue on your current medications as directed. Please refer to the Current Medication list given to you today.    Your physician recommends that you schedule a follow-up appointment in: 4 TO [place]

## 2014-06-28 NOTE — Progress Notes (Signed)
Patient ID: [name], female   DOB: February 25, 1933, 81 y.o.   MRN: [id number]      BRYONY KAMAN Date of Birth: 05-17-1932 Medical Record #[id number]  History of Present Illness: Ms. Curfman is seen back today for a work in visit. Seen for Dr. Meda Coffee. She is a 81 year old female with HLD, hypothyroidism, ulcerative colitis and history of swelling. No known CAD noted but her FH is significantly + for CAD with her father dying of myocardial infarction in his 64s and multiple brothers dying of myocardial infarction - one of them in at age of 79 and the others at their 36s.   She has had chronic but stable fibrosis on prior chest CT and CXRs.   Seen here back in September of 2015 - had previously had sinus surgery for some dyspnea and swelling. She had been admitted, diagnosed with acute diastolic HF and given lasix with improvement. Was doing well at her last visit with Dr. Meda Coffee.   Called here at the end of November - "Called stating she has been weak, having a dry cough, SOB, legs and feet swollen and tightness in chest for over a week. Saw Dr. Luan Pulling last Tuesday who increased her Lasix to 40 mg daily and K+ to 20 meq daily. Was told by him to come back today if not any better. She went by their office and there was a lobby full of pts waiting to be seen so she called here. States her legs and feet down. Wt has decreased from 146.8 last Tuesday to 143.7 today. Still has dry cough, weak and has chest tightness. States Dr. Luan Pulling did not do CXR last week. Spoke w/Lori Gerhardt,NP/flex today who suggests that she stay on Lasix 40 mg and K+ 20 meq and be seen next Monday. Made her an appointment with Cecille Rubin next Monday at 2:00. Advised if she continues to have SOB and cough to call our office before next Monday. She verbalizes understanding and will call if not any better."  Thus added to my schedule today.   Comes in today. Here alone. She has lots of issues. More swelling in her  feet/legs about 2 to 3 weeks ago. She has been short of breath. Little chest tightness. Cough - lots of phlegm at times - typically white. No recent CXR. Has lost her appetite but still goes out to eat - getting too much salt. Taking some NSAID occasionally as well. Since she increased the Lasix, she has improved but the sporadic productive cough is still present and she feels weak.   03/07/2014 - the patient states that Lasix restarted resolved her lower extremity edema however she still experiencing significant shortness of breath and fatigue after minimal exertion. No chest pain. She underwent pulmonary function test that was abnormal. She states that with breathing treatment and using of bronchodilators give her significant relief at the pulmonary function test. Her cough is nonproductive. No palpitations or syncope.  06/28/2014 - patient is coming after 4 months, patient continues having significant fatigue and shortness of breath with minimal exertion. In the meantime she saw Dr. Marlene Lard at pulmonary and was very dissatisfied with the visit as she felt that Dr. was of no help and was disrespectful. She went to [facility] for second opinion just this week, underwent multiple tests and is awaiting results. She denies any chest pain, orthopnea, paroxysmal nocturnal dyspnea, her lower extremity edema has resolved.  Current Outpatient Prescriptions  Medication Sig Dispense Refill  . acetaminophen (  TYLENOL) 325 MG tablet Take 325 mg by mouth as needed for mild pain or headache.    . ALPRAZolam (XANAX) 0.5 MG tablet Take 1 tablet (0.5 mg total) by mouth at bedtime as needed for anxiety. 30 tablet 3  . aspirin EC 81 MG tablet Take 81 mg by mouth every 7 (seven) days.     . calcium carbonate (OS-CAL) 600 MG TABS tablet Take 600 mg by mouth daily with breakfast.    . cholecalciferol (VITAMIN D) 1000 UNITS tablet Take 1,000 Units by mouth daily.      Marland Kitchen ESTRING 2 MG vaginal ring Place 2 mg vaginally every 3 (three)  months.     . furosemide (LASIX) 40 MG tablet Take 1 tablet (40 mg total) by mouth daily. 90 tablet 3  . levothyroxine (SYNTHROID, LEVOTHROID) 125 MCG tablet Take 1 tablet by mouth daily.    . metoprolol succinate (TOPROL XL) 25 MG 24 hr tablet Take 1 tablet (25 mg total) by mouth daily. 60 tablet 1  . omeprazole (PRILOSEC) 20 MG capsule Take 1 capsule (20 mg total) by mouth daily. (Patient taking differently: Take 20 mg by mouth 2 (two) times daily before a meal. ) 30 capsule 0  . Polyethyl Glycol-Propyl Glycol (SYSTANE OP) Apply to eye. TWICE WEEKLY AS NEEDED    . potassium chloride SA (K-DUR,KLOR-CON) 20 MEQ tablet Take 1 tablet (20 mEq total) by mouth daily. 60 tablet 1  . pravastatin (PRAVACHOL) 40 MG tablet Take 1 tablet (40 mg total) by mouth every evening. 90 tablet 3  . sodium chloride (OCEAN) 0.65 % nasal spray      No current facility-administered medications for this visit.    Allergies  Allergen Reactions  . Nebivolol Hcl     Other reaction(s): Other (See Comments) Chest tightness,sob,cough,severe tiredness,confusion  . Codeine Nausea Only  . Sulfa Antibiotics Nausea Only  . Sulfonamide Derivatives Nausea Only    Past Medical History  Diagnosis Date  . Osteoporosis, unspecified   . Edema   . Other and unspecified hyperlipidemia   . Unspecified hypothyroidism   . Diarrhea   . Other and unspecified noninfectious gastroenteritis and colitis(558.9)   . Family history of malignant neoplasm of gastrointestinal tract   . Flatulence, eructation, and gas pain   . GERD (gastroesophageal reflux disease)   . Stricture and stenosis of esophagus   . Esophagitis, unspecified   . Diverticulosis of colon (without mention of hemorrhage)   . Left sided ulcerative colitis   . URI (upper respiratory infection)   . Maxillary sinus mass   . Fibrosis of lung 03/14/2013  . CHF (congestive heart failure)     Past Surgical History  Procedure Laterality Date  . Abdominal hysterectomy     . Cholecystectomy    . Thyroidectomy, partial    . Foot surgery    . Yag laser application Right 1/85/6314    Procedure: YAG LASER APPLICATION;  Surgeon: Elta Guadeloupe T. Gershon Crane, MD;  Location: [facility];  Service: Ophthalmology;  Laterality: Right;    History  Smoking status  . Never Smoker   Smokeless tobacco  . Never Used    History  Alcohol Use No    Family History  Problem Relation Age of Onset  . Colon cancer Mother   . Stomach cancer Sister   . Diabetes Son   . Kidney cancer Son   . Adrenal disorder Son   . Heart disease Brother   . Heart attack Father  CHF    Review of Systems: The review of systems is per the HPI.  All other systems were reviewed and are negative.  Physical Exam: BP 124/66 mmHg  Pulse 92  Ht 5' 8"  (1.727 m)  Wt 145 lb (65.772 kg)  BMI 22.05 kg/m2  SpO2 97% Patient is very pleasant and in no acute distress. Little anxious and very talkative. Her weight is down 2 pounds. Skin is warm and dry. Color is normal.  HEENT is unremarkable. Normocephalic/atraumatic. PERRL. Sclera are nonicteric. Neck is supple. No masses. No JVD. Lungs are clear. Cardiac exam shows a regular rate and rhythm. Abdomen is soft. Extremities are without edema. Gait and ROM are intact. No gross neurologic deficits noted.  Wt Readings from Last 3 Encounters:  06/28/14 145 lb (65.772 kg)  05/01/14 143 lb (64.864 kg)  04/17/14 151 lb 9.6 oz (68.765 kg)    LABORATORY DATA/PROCEDURES:  EKG today with sinus rhythm.   Lab Results  Component Value Date   WBC 7.2 04/17/2014   HGB 12.8 04/17/2014   HCT 39.4 04/17/2014   PLT 245.0 04/17/2014   GLUCOSE 98 05/01/2014   CHOL 201* 04/06/2014   TRIG 73.0 04/06/2014   HDL 39.30 04/06/2014   LDLCALC 147* 04/06/2014   ALT 13 03/26/2014   AST 23 03/26/2014   NA 132* 05/01/2014   K 4.2 05/01/2014   CL 100 05/01/2014   CREATININE 0.7 05/01/2014   BUN 11 05/01/2014   CO2 24 05/01/2014   TSH 3.83 04/17/2014   HGBA1C 6.1* 03/14/2013      BNP (last 3 results)  Recent Labs  03/26/14 1227  PROBNP 193.0*    Myoview Impression from December 2014 Exercise Capacity: Poor exercise capacity. BP Response: Normal blood pressure response. Clinical Symptoms: There is dyspnea. ECG Impression: No significant ST segment change suggestive of ischemia. Comparison with Prior Nuclear Study: No images to compare  Overall Impression: Normal stress nuclear study. Limited exercise due to dyspnea  LV Ejection Fraction: 84%. LV Wall Motion: NL LV Function; NL Wall Motion  [place]  Echo Study Conclusions from November 2014  - Left ventricle: The cavity size was normal. Wall thickness was increased in a pattern of mild LVH. Systolic function was normal. The estimated ejection fraction was in the range of 60% to 65%. Wall motion was normal; there were no regional wall motion abnormalities. Features are consistent with a pseudonormal left ventricular filling pattern, with concomitant abnormal relaxation and increased filling pressure (grade 2 diastolic dysfunction). - Aortic valve: There was no stenosis. - Mitral valve: Mild to moderate regurgitation. - Right ventricle: Poorly visualized. The cavity size was normal. Systolic function was normal. - Tricuspid valve: Peak RV-RA gradient: 69m Hg (S). - Pulmonary arteries: PA peak pressure: 314mHg (S). - Inferior vena cava: The vessel was normal in size; the respirophasic diameter changes were in the normal range (= 50%); findings are consistent with normal central venous pressure. Impressions:  - Normal LV size with mild LV hypertrophy. EF 60-65%. Moderate diastolic dysfunction. There appeared to be mild to moderate mitral regurgitation. Normal RV size and systolic function. Borderline pulmonary hypertension.    Assessment / Plan:  1. Dyspnea/cough - again I believe that her problems are related to underlying interstitial lung  disease as she has normal systolic heart function and does have diastolic heart failure however she is currently euvolemic.  I will await for the results from [facility] follow.  In the past we tried to switch her  metoprolol to Nebivolol (Bystolic 5 mg PO BID) however patient didn't tolerate this medication as it made her very tired.  2. Chronic diastolic CHF - resolved LE edema, currently euvolemic, we will continue maintenance dose of lasix 40 mg po daily   3. HLD - triglycerides normal, HDL low normal, LDL 147, she was started and pravastatin 40 that she is tolerating well.   Follow up in 3 months.  Dorothy Spark 06/28/2014

## 2014-07-03 ENCOUNTER — Other Ambulatory Visit: Payer: Self-pay

## 2014-07-03 MED ORDER — POTASSIUM CHLORIDE CRYS ER 20 MEQ PO TBCR: 20.0000 meq | EXTENDED_RELEASE_TABLET | Freq: Every day | ORAL | Status: DC

## 2014-07-06 DIAGNOSIS — I071 Rheumatic tricuspid insufficiency: Secondary | ICD-10-CM | POA: Insufficient documentation

## 2014-07-06 DIAGNOSIS — J849 Interstitial pulmonary disease, unspecified: Secondary | ICD-10-CM | POA: Diagnosis not present

## 2014-07-06 DIAGNOSIS — I340 Nonrheumatic mitral (valve) insufficiency: Secondary | ICD-10-CM | POA: Insufficient documentation

## 2014-07-06 DIAGNOSIS — I429 Cardiomyopathy, unspecified: Secondary | ICD-10-CM | POA: Diagnosis not present

## 2014-07-06 DIAGNOSIS — I509 Heart failure, unspecified: Secondary | ICD-10-CM | POA: Diagnosis not present

## 2014-07-06 DIAGNOSIS — R0602 Shortness of breath: Secondary | ICD-10-CM | POA: Diagnosis not present

## 2014-07-15 DIAGNOSIS — R0602 Shortness of breath: Secondary | ICD-10-CM | POA: Diagnosis not present

## 2014-07-16 ENCOUNTER — Other Ambulatory Visit: Payer: Self-pay | Admitting: Cardiology

## 2014-07-16 DIAGNOSIS — I429 Cardiomyopathy, unspecified: Secondary | ICD-10-CM | POA: Diagnosis not present

## 2014-07-16 DIAGNOSIS — I340 Nonrheumatic mitral (valve) insufficiency: Secondary | ICD-10-CM | POA: Diagnosis not present

## 2014-07-16 DIAGNOSIS — I071 Rheumatic tricuspid insufficiency: Secondary | ICD-10-CM | POA: Diagnosis not present

## 2014-07-16 DIAGNOSIS — R0602 Shortness of breath: Secondary | ICD-10-CM | POA: Diagnosis not present

## 2014-07-16 DIAGNOSIS — I509 Heart failure, unspecified: Secondary | ICD-10-CM | POA: Diagnosis not present

## 2014-07-26 DIAGNOSIS — N952 Postmenopausal atrophic vaginitis: Secondary | ICD-10-CM | POA: Diagnosis not present

## 2014-08-03 DIAGNOSIS — C9000 Multiple myeloma not having achieved remission: Secondary | ICD-10-CM | POA: Diagnosis not present

## 2014-08-03 DIAGNOSIS — J849 Interstitial pulmonary disease, unspecified: Secondary | ICD-10-CM | POA: Diagnosis not present

## 2014-08-03 DIAGNOSIS — E440 Moderate protein-calorie malnutrition: Secondary | ICD-10-CM | POA: Diagnosis not present

## 2014-08-03 DIAGNOSIS — Z1379 Encounter for other screening for genetic and chromosomal anomalies: Secondary | ICD-10-CM | POA: Diagnosis not present

## 2014-08-03 DIAGNOSIS — R7989 Other specified abnormal findings of blood chemistry: Secondary | ICD-10-CM | POA: Diagnosis not present

## 2014-08-03 DIAGNOSIS — J811 Chronic pulmonary edema: Secondary | ICD-10-CM | POA: Diagnosis not present

## 2014-08-03 DIAGNOSIS — K5190 Ulcerative colitis, unspecified, without complications: Secondary | ICD-10-CM | POA: Diagnosis not present

## 2014-08-03 DIAGNOSIS — I429 Cardiomyopathy, unspecified: Secondary | ICD-10-CM | POA: Diagnosis not present

## 2014-08-03 DIAGNOSIS — R262 Difficulty in walking, not elsewhere classified: Secondary | ICD-10-CM | POA: Diagnosis not present

## 2014-08-03 DIAGNOSIS — R768 Other specified abnormal immunological findings in serum: Secondary | ICD-10-CM | POA: Diagnosis not present

## 2014-08-03 DIAGNOSIS — I509 Heart failure, unspecified: Secondary | ICD-10-CM | POA: Diagnosis not present

## 2014-08-03 DIAGNOSIS — R600 Localized edema: Secondary | ICD-10-CM | POA: Diagnosis not present

## 2014-08-03 DIAGNOSIS — R0602 Shortness of breath: Secondary | ICD-10-CM | POA: Diagnosis not present

## 2014-08-03 DIAGNOSIS — J90 Pleural effusion, not elsewhere classified: Secondary | ICD-10-CM | POA: Diagnosis not present

## 2014-08-03 DIAGNOSIS — I071 Rheumatic tricuspid insufficiency: Secondary | ICD-10-CM | POA: Diagnosis not present

## 2014-08-03 DIAGNOSIS — E859 Amyloidosis, unspecified: Secondary | ICD-10-CM | POA: Diagnosis not present

## 2014-08-03 DIAGNOSIS — I43 Cardiomyopathy in diseases classified elsewhere: Secondary | ICD-10-CM | POA: Diagnosis not present

## 2014-08-03 DIAGNOSIS — I340 Nonrheumatic mitral (valve) insufficiency: Secondary | ICD-10-CM | POA: Diagnosis not present

## 2014-08-03 DIAGNOSIS — E854 Organ-limited amyloidosis: Secondary | ICD-10-CM | POA: Diagnosis not present

## 2014-08-03 DIAGNOSIS — R0789 Other chest pain: Secondary | ICD-10-CM | POA: Diagnosis not present

## 2014-08-03 DIAGNOSIS — I5033 Acute on chronic diastolic (congestive) heart failure: Secondary | ICD-10-CM | POA: Diagnosis not present

## 2014-08-13 ENCOUNTER — Telehealth: Payer: Self-pay | Admitting: Cardiology

## 2014-08-13 NOTE — Telephone Encounter (Signed)
New message    Patient need to discuss upcoming appt on Wednesday.

## 2014-08-13 NOTE — Telephone Encounter (Signed)
Pt calling to cancel her appt with Dr Meda Coffee for this Wednesday 4/27.  Pt states she is being followed by [facility] for some hematology issues and prefers to follow-up with Dr Meda Coffee on a as needed basis for now.  Informed the pt that I will cancel her appt for Wednesday and route this message to Dr Meda Coffee to make her aware of pts decision. Pt verbalized understanding and agrees with this plan.

## 2014-08-15 ENCOUNTER — Ambulatory Visit: Payer: Medicare Other | Admitting: Cardiology

## 2014-08-17 DIAGNOSIS — E859 Amyloidosis, unspecified: Secondary | ICD-10-CM | POA: Diagnosis not present

## 2014-08-17 DIAGNOSIS — Z79899 Other long term (current) drug therapy: Secondary | ICD-10-CM | POA: Diagnosis not present

## 2014-08-17 DIAGNOSIS — R0609 Other forms of dyspnea: Secondary | ICD-10-CM | POA: Diagnosis not present

## 2014-08-17 DIAGNOSIS — I429 Cardiomyopathy, unspecified: Secondary | ICD-10-CM | POA: Diagnosis not present

## 2014-08-17 DIAGNOSIS — I509 Heart failure, unspecified: Secondary | ICD-10-CM | POA: Diagnosis not present

## 2014-08-20 ENCOUNTER — Encounter: Payer: Self-pay | Admitting: Gastroenterology

## 2014-08-20 ENCOUNTER — Ambulatory Visit (INDEPENDENT_AMBULATORY_CARE_PROVIDER_SITE_OTHER): Payer: Medicare Other | Admitting: Gastroenterology

## 2014-08-20 VITALS — BP 90/50 | HR 100 | Ht 68.0 in | Wt 139.4 lb

## 2014-08-20 DIAGNOSIS — R194 Change in bowel habit: Secondary | ICD-10-CM | POA: Insufficient documentation

## 2014-08-20 DIAGNOSIS — Z800 Family history of malignant neoplasm of digestive organs: Secondary | ICD-10-CM | POA: Diagnosis not present

## 2014-08-20 DIAGNOSIS — K5150 Left sided colitis without complications: Secondary | ICD-10-CM

## 2014-08-20 MED ORDER — NA SULFATE-K SULFATE-MG SULF 17.5-3.13-1.6 GM/177ML PO SOLN: 1.0000 | Freq: Once | ORAL | Status: DC

## 2014-08-20 NOTE — Patient Instructions (Signed)

## 2014-08-20 NOTE — Addendum Note (Signed)
Addended by: Oda Kilts on: 08/20/2014 02:03 PM   Modules accepted: Orders

## 2014-08-20 NOTE — Progress Notes (Signed)
_                                                                                                                History of Present Illness:  Cindy [name] has a family history of colon cancer (mother) and undergoes periodic colonoscopy.  Last examined 2011 demonstrated left-sided diverticulosis.  She also has had left sided colitis for which she takes lialda.  For several months she has been complaining of change in stool caliber.  She's not having pencil thin stools.  She denies abdominal pain or rectal bleeding.  Since her last office visit she was diagnosed with amyloidosis affecting the heart.  She's had problems with congestive heart failure.  Cardiac status has been stable for the last couple weeks.  Except for easy fatigability she has no other complaints.   Past Medical History  Diagnosis Date  . Osteoporosis, unspecified   . Edema   . Other and unspecified hyperlipidemia   . Unspecified hypothyroidism   . Diarrhea   . Other and unspecified noninfectious gastroenteritis and colitis(558.9)   . Family history of malignant neoplasm of gastrointestinal tract   . Flatulence, eructation, and gas pain   . GERD (gastroesophageal reflux disease)   . Stricture and stenosis of esophagus   . Esophagitis, unspecified   . Diverticulosis of colon (without mention of hemorrhage)   . Left sided ulcerative colitis   . URI (upper respiratory infection)   . Maxillary sinus mass   . Fibrosis of lung 03/14/2013  . CHF (congestive heart failure)     Amyloidosis   Past Surgical History  Procedure Laterality Date  . Abdominal hysterectomy    . Cholecystectomy    . Thyroidectomy, partial    . Foot surgery    . Yag laser application Right 0/86/7619    Procedure: YAG LASER APPLICATION;  Surgeon: Elta Guadeloupe T. Gershon Crane, MD;  Location: [facility];  Service: Ophthalmology;  Laterality: Right;   family history includes Adrenal disorder in her son; Colon cancer in her mother; Diabetes in her son;  Heart attack in her father; Heart disease in her brother; Kidney cancer in her son; Stomach cancer in her sister. Current Outpatient Prescriptions  Medication Sig Dispense Refill  . acetaminophen (TYLENOL) 325 MG tablet Take 325 mg by mouth as needed for mild pain or headache.    . ALPRAZolam (XANAX) 0.5 MG tablet Take 1 tablet (0.5 mg total) by mouth at bedtime as needed for anxiety. 30 tablet 3  . calcium carbonate (OS-CAL) 600 MG TABS tablet Take 600 mg by mouth daily with breakfast.    . cholecalciferol (VITAMIN D) 1000 UNITS tablet Take 1,000 Units by mouth daily.      Marland Kitchen ESTRING 2 MG vaginal ring Place 2 mg vaginally every 3 (three) months.     . levothyroxine (SYNTHROID, LEVOTHROID) 125 MCG tablet Take 1 tablet by mouth daily.    . metoprolol succinate (TOPROL-XL) 25 MG 24 hr tablet Take 1 tablet by mouth  daily 90 tablet 1  .  omeprazole (PRILOSEC) 20 MG capsule Take 1 capsule (20 mg total) by mouth daily. (Patient taking differently: Take 20 mg by mouth 2 (two) times daily before a meal. ) 30 capsule 0  . Polyethyl Glycol-Propyl Glycol (SYSTANE OP) Apply to eye. TWICE WEEKLY AS NEEDED    . potassium chloride SA (K-DUR,KLOR-CON) 20 MEQ tablet Take 1 tablet (20 mEq total) by mouth daily. 60 tablet 1  . sodium chloride (OCEAN) 0.65 % nasal spray     . spironolactone (ALDACTONE) 25 MG tablet Take by mouth.    . torsemide (DEMADEX) 10 MG tablet Take by mouth.     No current facility-administered medications for this visit.   Allergies as of 08/20/2014 - Review Complete 08/20/2014  Allergen Reaction Noted  . Nebivolol hcl  06/28/2014  . Codeine Nausea Only   . Sulfa antibiotics Nausea Only 06/28/2014  . Sulfonamide derivatives Nausea Only     reports that she has never smoked. She has never used smokeless tobacco. She reports that she does not drink alcohol or use illicit drugs.   Review of Systems: Pertinent positive and negative review of systems were noted in the above HPI section.  All other review of systems were otherwise negative.  Vital signs were reviewed in today's medical record Physical Exam: General: Well developed , well nourished, no acute distress Skin: anicteric Head: Normocephalic and atraumatic Eyes:  sclerae anicteric, EOMI Ears: Normal auditory acuity Mouth: No deformity or lesions Neck: Supple, no masses or thyromegaly Lymph Nodes: no lymphadenopathy Lungs: Clear throughout to auscultation Heart: Regular rate and rhythm; no murmurs, rubs or bruits Gastroinestinal: Soft, non tender and non distended. No masses, hepatosplenomegaly or hernias noted. Normal Bowel sounds Rectal:deferred Musculoskeletal: Symmetrical with no gross deformities  Skin: No lesions on visible extremities Pulses:  Normal pulses noted Extremities: No clubbing, cyanosis, edema or deformities noted Neurological: Alert oriented x 4, grossly nonfocal Cervical Nodes:  No significant cervical adenopathy Inguinal Nodes: No significant inguinal adenopathy Psychological:  Alert and cooperative. Normal mood and affect  See Assessment and Plan under Problem List

## 2014-08-20 NOTE — Assessment & Plan Note (Signed)
Change in bowel habits with pencil thin stools raises the question of a structural left Cindy [name] the colon.  Previously she has had mild left-sided diverticular disease.  Recommendations #1 colonoscopy.  I believe her cardiac status is stable and that she will tolerate the exam

## 2014-08-20 NOTE — Assessment & Plan Note (Signed)
She remains in clinical remission on lialda.  Plan to continue with the same.

## 2014-08-20 NOTE — Assessment & Plan Note (Signed)
While generally stopping surveillance examinations at 78-80, she has had a change in bowel habits raising the question of a structural left [place].  Her cardiac status seems stable.  Accordingly, will proceed with colonoscopy

## 2014-08-21 ENCOUNTER — Telehealth: Payer: Self-pay | Admitting: Gastroenterology

## 2014-08-21 NOTE — Telephone Encounter (Signed)
She needs a low sodium prep for the colonoscopy. Her cardiologist has given her instructions on how to take her medications prior to the procedure and the day of her procedure. She has been prescribed Suprep. No contraindication found.

## 2014-08-27 ENCOUNTER — Telehealth: Payer: Self-pay | Admitting: Gastroenterology

## 2014-08-28 NOTE — Telephone Encounter (Signed)
Returned patients call. Not sure what records she is looking for. Called. No voice mail came on

## 2014-08-30 ENCOUNTER — Ambulatory Visit (AMBULATORY_SURGERY_CENTER): Payer: Medicare Other | Admitting: Gastroenterology

## 2014-08-30 ENCOUNTER — Encounter: Payer: Self-pay | Admitting: Gastroenterology

## 2014-08-30 VITALS — BP 102/66 | HR 93 | Temp 98.3°F | Resp 47 | Ht 68.0 in | Wt 139.0 lb

## 2014-08-30 DIAGNOSIS — E859 Amyloidosis, unspecified: Secondary | ICD-10-CM | POA: Diagnosis not present

## 2014-08-30 DIAGNOSIS — R194 Change in bowel habit: Secondary | ICD-10-CM

## 2014-08-30 DIAGNOSIS — Z800 Family history of malignant neoplasm of digestive organs: Secondary | ICD-10-CM

## 2014-08-30 DIAGNOSIS — E039 Hypothyroidism, unspecified: Secondary | ICD-10-CM | POA: Diagnosis not present

## 2014-08-30 DIAGNOSIS — K5190 Ulcerative colitis, unspecified, without complications: Secondary | ICD-10-CM | POA: Diagnosis not present

## 2014-08-30 DIAGNOSIS — K5730 Diverticulosis of large intestine without perforation or abscess without bleeding: Secondary | ICD-10-CM

## 2014-08-30 MED ORDER — SODIUM CHLORIDE 0.9 % IV SOLN: 500.0000 mL | INTRAVENOUS | Status: DC

## 2014-08-30 NOTE — Patient Instructions (Signed)
YOU HAD AN ENDOSCOPIC PROCEDURE TODAY AT [facility]:   Refer to the procedure report that was given to you for any specific questions about what was found during the examination.  If the procedure report does not answer your questions, please call your gastroenterologist to clarify.  If you requested that your care partner not be given the details of your procedure findings, then the procedure report has been included in a sealed envelope for you to review at your convenience later.  YOU SHOULD EXPECT: Some feelings of bloating in the abdomen. Passage of more gas than usual.  Walking can help get rid of the air that was put into your GI tract during the procedure and reduce the bloating. If you had a lower endoscopy (such as a colonoscopy or flexible sigmoidoscopy) you may notice spotting of blood in your stool or on the toilet paper. If you underwent a bowel prep for your procedure, you may not have a normal bowel movement for a few days.  Please Note:  You might notice some irritation and congestion in your nose or some drainage.  This is from the oxygen used during your procedure.  There is no need for concern and it should clear up in a day or so.  SYMPTOMS TO REPORT IMMEDIATELY:   Following lower endoscopy (colonoscopy or flexible sigmoidoscopy):  Excessive amounts of blood in the stool  Significant tenderness or worsening of abdominal pains  Swelling of the abdomen that is new, acute  Fever of 100F or higher   For urgent or emergent issues, a gastroenterologist can be reached at any hour by calling [phone].   DIET: Your first meal following the procedure should be a small meal and then it is ok to progress to your normal diet. Heavy or fried foods are harder to digest and may make you feel nauseous or bloated.  Likewise, meals heavy in dairy and vegetables can increase bloating.  Drink plenty of fluids but you should avoid alcoholic beverages for 24  hours.  ACTIVITY:  You should plan to take it easy for the rest of today and you should NOT DRIVE or use heavy machinery until tomorrow (because of the sedation medicines used during the test).    FOLLOW UP: Our staff will call the number listed on your records the next business day following your procedure to check on you and address any questions or concerns that you may have regarding the information given to you following your procedure. If we do not reach you, we will leave a message.  However, if you are feeling well and you are not experiencing any problems, there is no need to return our call.  We will assume that you have returned to your regular daily activities without incident.  If any biopsies were taken you will be contacted by phone or by letter within the next 1-3 weeks.  Please call us at [phone] if you have not heard about the biopsies in 3 weeks.    SIGNATURES/CONFIDENTIALITY: You and/or your care partner have signed paperwork which will be entered into your electronic medical record.  These signatures attest to the fact that that the information above on your After Visit Summary has been reviewed and is understood.  Full responsibility of the confidentiality of this discharge information lies with you and/or your care-partner.  Please review diverticulosis and high fiber diet handouts provided.

## 2014-08-30 NOTE — Op Note (Signed)
[place]  [address] [place], [id number]   COLONOSCOPY PROCEDURE REPORT  PATIENT: Cindy, [name]  MR#: #[id number] BIRTHDATE: 08-19-1932 , 42  yrs. old GENDER: female ENDOSCOPIST: Inda Castle, MD REFERRED QM:VHQION Luan Pulling, M.D. PROCEDURE DATE:  08/30/2014 PROCEDURE:   Colonoscopy, screening First Screening Colonoscopy - Avg.  risk and is 50 yrs.  old or older - No.  Prior Negative Screening - Now for repeat screening. Above average risk  History of Adenoma - Now for follow-up colonoscopy & has been > or = to 3 yrs.  N/A  Polyps removed today = NO ASA CLASS:   Class II INDICATIONS:FH Colon or Rectal Adenocarcinoma and change in bowel habits. MEDICATIONS: Monitored anesthesia care and Propofol 250 mg IV  DESCRIPTION OF PROCEDURE:   After the risks benefits and alternatives of the procedure were thoroughly explained, informed consent was obtained.  The digital rectal exam      The LB CF-H180AL Loaner [id number]  endoscope was introduced through the anus and advanced to the cecum, which was identified by both the appendix and ileocecal valve. No adverse events experienced.   The quality of the prep was (Suprep was used) good.  The instrument was then slowly withdrawn as the colon was fully examined.      COLON FINDINGS: There was severe diverticulosis noted in the sigmoid colon with associated colonic spasm and muscular hypertrophy.   The examination was otherwise normal.  Retroflexed views revealed no abnormalities. The time to cecum = 8.3 Withdrawal time = 6.6   The scope was withdrawn and the procedure completed. COMPLICATIONS: There were no immediate complications.  ENDOSCOPIC IMPRESSION: 1.   There was severe diverticulosis noted in the sigmoid colon 2.   The examination was otherwise normal  RECOMMENDATIONS: 1.  High fiber diet 2.  Given your age, you will not need another colonoscopy for colon cancer screening or polyp surveillance.  These  types of tests usually stop around the age 77.  eSigned:  Inda Castle, MD 08/30/2014 9:02 AM   cc:   PATIENT NAME:  Cindy, [name] MR#: #[id number]

## 2014-08-30 NOTE — Progress Notes (Signed)
To recovery, report to Doyle Askew, RN, VSS.

## 2014-08-30 NOTE — Telephone Encounter (Signed)
Patient had procedure today. Asked someone in Endoscopy about her records from [facility] today.  By the time I received her message she was discharged from Endoscopy  I have tried to call the Scan center to see if they have them. These records would have been reviewed by Dr Deatra Ina and I would have sent them to be scanned into [facility].  She never said she needed them back so we more than Likley  sent them to be scanned in.  Will try scan center again

## 2014-08-31 ENCOUNTER — Telehealth: Payer: Self-pay | Admitting: *Deleted

## 2014-08-31 NOTE — Telephone Encounter (Signed)
  Follow up Call-  Call back number 08/30/2014  Post procedure Call Back phone  # [phone]  Permission to leave phone message Yes     Patient questions:  Do you have a fever, pain , or abdominal swelling? No. Pain Score  0 *  Have you tolerated food without any problems? Yes.    Have you been able to return to your normal activities? Yes.    Do you have any questions about your discharge instructions: Diet   No. Medications  No. Follow up visit  No.  Do you have questions or concerns about your Care? No.  Actions: * If pain score is 4 or above: No action needed, pain <4.

## 2014-09-04 DIAGNOSIS — J90 Pleural effusion, not elsewhere classified: Secondary | ICD-10-CM | POA: Diagnosis not present

## 2014-09-04 DIAGNOSIS — I509 Heart failure, unspecified: Secondary | ICD-10-CM | POA: Diagnosis not present

## 2014-09-04 DIAGNOSIS — Z79899 Other long term (current) drug therapy: Secondary | ICD-10-CM | POA: Diagnosis not present

## 2014-09-04 DIAGNOSIS — I340 Nonrheumatic mitral (valve) insufficiency: Secondary | ICD-10-CM | POA: Diagnosis not present

## 2014-09-04 DIAGNOSIS — E871 Hypo-osmolality and hyponatremia: Secondary | ICD-10-CM | POA: Diagnosis not present

## 2014-09-04 DIAGNOSIS — E858 Other amyloidosis: Secondary | ICD-10-CM | POA: Diagnosis not present

## 2014-09-07 ENCOUNTER — Telehealth: Payer: Self-pay | Admitting: Gastroenterology

## 2014-09-07 NOTE — Telephone Encounter (Signed)
I have left message for the patient to call back. She is out of the house per the female answering the phone.

## 2014-09-10 NOTE — Telephone Encounter (Signed)
I have left message for the patient to call back

## 2014-09-11 ENCOUNTER — Other Ambulatory Visit: Payer: Self-pay | Admitting: Gastroenterology

## 2014-09-11 NOTE — Telephone Encounter (Signed)
Spoke to the patient and advised her. Staff message to myself to refill her Lialda in 21 days to [facility] mail order per her request.

## 2014-09-11 NOTE — Telephone Encounter (Signed)
She should stay on lialda since it is clearly keeping her in remission

## 2014-09-11 NOTE — Telephone Encounter (Signed)
She had a return of her diarrhea. Multiple stools each day. She took 1 imodium and then decided to restart the Lialda once daily. Diarrhea went away. She is concerned she shouldn't be taking Lialda because you had taken her off of it in December. What do you advise?

## 2014-09-18 DIAGNOSIS — E854 Organ-limited amyloidosis: Secondary | ICD-10-CM | POA: Diagnosis not present

## 2014-09-18 DIAGNOSIS — R0602 Shortness of breath: Secondary | ICD-10-CM | POA: Diagnosis not present

## 2014-09-18 DIAGNOSIS — E858 Other amyloidosis: Secondary | ICD-10-CM | POA: Diagnosis not present

## 2014-09-18 DIAGNOSIS — R110 Nausea: Secondary | ICD-10-CM | POA: Diagnosis not present

## 2014-09-18 DIAGNOSIS — Z5111 Encounter for antineoplastic chemotherapy: Secondary | ICD-10-CM | POA: Diagnosis not present

## 2014-09-21 ENCOUNTER — Other Ambulatory Visit: Payer: Self-pay | Admitting: *Deleted

## 2014-09-21 MED ORDER — METOPROLOL SUCCINATE ER 25 MG PO TB24: ORAL_TABLET | ORAL | Status: DC

## 2014-09-26 DIAGNOSIS — Z0389 Encounter for observation for other suspected diseases and conditions ruled out: Secondary | ICD-10-CM | POA: Diagnosis not present

## 2014-09-26 DIAGNOSIS — E858 Other amyloidosis: Secondary | ICD-10-CM | POA: Diagnosis not present

## 2014-09-26 DIAGNOSIS — K5190 Ulcerative colitis, unspecified, without complications: Secondary | ICD-10-CM | POA: Diagnosis not present

## 2014-09-26 DIAGNOSIS — Z1379 Encounter for other screening for genetic and chromosomal anomalies: Secondary | ICD-10-CM | POA: Diagnosis not present

## 2014-09-26 DIAGNOSIS — Z006 Encounter for examination for normal comparison and control in clinical research program: Secondary | ICD-10-CM | POA: Diagnosis not present

## 2014-09-26 DIAGNOSIS — Z79899 Other long term (current) drug therapy: Secondary | ICD-10-CM | POA: Diagnosis not present

## 2014-09-27 DIAGNOSIS — E858 Other amyloidosis: Secondary | ICD-10-CM | POA: Diagnosis not present

## 2014-09-27 DIAGNOSIS — I429 Cardiomyopathy, unspecified: Secondary | ICD-10-CM | POA: Diagnosis not present

## 2014-09-27 DIAGNOSIS — I361 Nonrheumatic tricuspid (valve) insufficiency: Secondary | ICD-10-CM | POA: Diagnosis not present

## 2014-09-27 DIAGNOSIS — I340 Nonrheumatic mitral (valve) insufficiency: Secondary | ICD-10-CM | POA: Diagnosis not present

## 2014-09-27 DIAGNOSIS — Z006 Encounter for examination for normal comparison and control in clinical research program: Secondary | ICD-10-CM | POA: Diagnosis not present

## 2014-10-02 ENCOUNTER — Other Ambulatory Visit: Payer: Self-pay

## 2014-10-02 MED ORDER — MESALAMINE 1.2 G PO TBEC: DELAYED_RELEASE_TABLET | ORAL | Status: DC

## 2014-10-09 DIAGNOSIS — E858 Other amyloidosis: Secondary | ICD-10-CM | POA: Diagnosis not present

## 2014-10-09 DIAGNOSIS — Z006 Encounter for examination for normal comparison and control in clinical research program: Secondary | ICD-10-CM | POA: Diagnosis not present

## 2014-10-09 DIAGNOSIS — Z5111 Encounter for antineoplastic chemotherapy: Secondary | ICD-10-CM | POA: Diagnosis not present

## 2014-10-11 DIAGNOSIS — J984 Other disorders of lung: Secondary | ICD-10-CM | POA: Diagnosis present

## 2014-10-11 DIAGNOSIS — D649 Anemia, unspecified: Secondary | ICD-10-CM | POA: Diagnosis present

## 2014-10-11 DIAGNOSIS — R635 Abnormal weight gain: Secondary | ICD-10-CM | POA: Diagnosis not present

## 2014-10-11 DIAGNOSIS — F419 Anxiety disorder, unspecified: Secondary | ICD-10-CM | POA: Diagnosis present

## 2014-10-11 DIAGNOSIS — I441 Atrioventricular block, second degree: Secondary | ICD-10-CM | POA: Diagnosis not present

## 2014-10-11 DIAGNOSIS — I5031 Acute diastolic (congestive) heart failure: Secondary | ICD-10-CM | POA: Diagnosis not present

## 2014-10-11 DIAGNOSIS — K5190 Ulcerative colitis, unspecified, without complications: Secondary | ICD-10-CM | POA: Diagnosis present

## 2014-10-11 DIAGNOSIS — I471 Supraventricular tachycardia: Secondary | ICD-10-CM | POA: Diagnosis not present

## 2014-10-11 DIAGNOSIS — Z9079 Acquired absence of other genital organ(s): Secondary | ICD-10-CM | POA: Diagnosis present

## 2014-10-11 DIAGNOSIS — E858 Other amyloidosis: Secondary | ICD-10-CM | POA: Diagnosis not present

## 2014-10-11 DIAGNOSIS — I43 Cardiomyopathy in diseases classified elsewhere: Secondary | ICD-10-CM | POA: Diagnosis not present

## 2014-10-11 DIAGNOSIS — Z006 Encounter for examination for normal comparison and control in clinical research program: Secondary | ICD-10-CM | POA: Diagnosis not present

## 2014-10-11 DIAGNOSIS — E859 Amyloidosis, unspecified: Secondary | ICD-10-CM | POA: Diagnosis not present

## 2014-10-11 DIAGNOSIS — R002 Palpitations: Secondary | ICD-10-CM | POA: Diagnosis not present

## 2014-10-11 DIAGNOSIS — R918 Other nonspecific abnormal finding of lung field: Secondary | ICD-10-CM | POA: Diagnosis not present

## 2014-10-11 DIAGNOSIS — Z882 Allergy status to sulfonamides status: Secondary | ICD-10-CM | POA: Diagnosis not present

## 2014-10-11 DIAGNOSIS — E854 Organ-limited amyloidosis: Secondary | ICD-10-CM | POA: Diagnosis present

## 2014-10-11 DIAGNOSIS — E039 Hypothyroidism, unspecified: Secondary | ICD-10-CM | POA: Diagnosis present

## 2014-10-16 DIAGNOSIS — R0600 Dyspnea, unspecified: Secondary | ICD-10-CM | POA: Diagnosis not present

## 2014-10-16 DIAGNOSIS — Z5111 Encounter for antineoplastic chemotherapy: Secondary | ICD-10-CM | POA: Diagnosis not present

## 2014-10-16 DIAGNOSIS — I43 Cardiomyopathy in diseases classified elsewhere: Secondary | ICD-10-CM | POA: Diagnosis not present

## 2014-10-16 DIAGNOSIS — Z006 Encounter for examination for normal comparison and control in clinical research program: Secondary | ICD-10-CM | POA: Diagnosis not present

## 2014-10-16 DIAGNOSIS — E854 Organ-limited amyloidosis: Secondary | ICD-10-CM | POA: Diagnosis not present

## 2014-10-16 DIAGNOSIS — K5180 Other ulcerative colitis without complications: Secondary | ICD-10-CM | POA: Diagnosis not present

## 2014-10-16 DIAGNOSIS — J984 Other disorders of lung: Secondary | ICD-10-CM | POA: Diagnosis not present

## 2014-10-16 DIAGNOSIS — E858 Other amyloidosis: Secondary | ICD-10-CM | POA: Diagnosis not present

## 2014-10-16 DIAGNOSIS — R110 Nausea: Secondary | ICD-10-CM | POA: Diagnosis not present

## 2014-10-16 DIAGNOSIS — I5033 Acute on chronic diastolic (congestive) heart failure: Secondary | ICD-10-CM | POA: Diagnosis not present

## 2014-10-16 DIAGNOSIS — R002 Palpitations: Secondary | ICD-10-CM | POA: Diagnosis not present

## 2014-10-16 DIAGNOSIS — E859 Amyloidosis, unspecified: Secondary | ICD-10-CM | POA: Diagnosis not present

## 2014-10-16 DIAGNOSIS — J849 Interstitial pulmonary disease, unspecified: Secondary | ICD-10-CM | POA: Diagnosis not present

## 2014-10-16 DIAGNOSIS — I509 Heart failure, unspecified: Secondary | ICD-10-CM | POA: Diagnosis not present

## 2014-10-23 DIAGNOSIS — I5032 Chronic diastolic (congestive) heart failure: Secondary | ICD-10-CM | POA: Diagnosis not present

## 2014-10-23 DIAGNOSIS — E854 Organ-limited amyloidosis: Secondary | ICD-10-CM | POA: Diagnosis not present

## 2014-10-23 DIAGNOSIS — Z006 Encounter for examination for normal comparison and control in clinical research program: Secondary | ICD-10-CM | POA: Diagnosis not present

## 2014-10-23 DIAGNOSIS — Z5112 Encounter for antineoplastic immunotherapy: Secondary | ICD-10-CM | POA: Diagnosis not present

## 2014-10-23 DIAGNOSIS — Z5111 Encounter for antineoplastic chemotherapy: Secondary | ICD-10-CM | POA: Diagnosis not present

## 2014-10-23 DIAGNOSIS — R110 Nausea: Secondary | ICD-10-CM | POA: Diagnosis not present

## 2014-10-23 DIAGNOSIS — I43 Cardiomyopathy in diseases classified elsewhere: Secondary | ICD-10-CM | POA: Diagnosis not present

## 2014-10-23 DIAGNOSIS — E858 Other amyloidosis: Secondary | ICD-10-CM | POA: Diagnosis not present

## 2014-10-25 ENCOUNTER — Inpatient Hospital Stay (HOSPITAL_COMMUNITY)
Admission: EM | Admit: 2014-10-25 | Discharge: 2014-10-26 | DRG: 292 | Payer: Medicare Other | Attending: Pulmonary Disease | Admitting: Pulmonary Disease

## 2014-10-25 ENCOUNTER — Emergency Department (HOSPITAL_COMMUNITY): Payer: Medicare Other

## 2014-10-25 ENCOUNTER — Encounter (HOSPITAL_COMMUNITY): Payer: Self-pay | Admitting: Emergency Medicine

## 2014-10-25 DIAGNOSIS — E871 Hypo-osmolality and hyponatremia: Secondary | ICD-10-CM | POA: Diagnosis present

## 2014-10-25 DIAGNOSIS — Z888 Allergy status to other drugs, medicaments and biological substances status: Secondary | ICD-10-CM | POA: Diagnosis not present

## 2014-10-25 DIAGNOSIS — E039 Hypothyroidism, unspecified: Secondary | ICD-10-CM | POA: Diagnosis present

## 2014-10-25 DIAGNOSIS — K222 Esophageal obstruction: Secondary | ICD-10-CM | POA: Diagnosis present

## 2014-10-25 DIAGNOSIS — E859 Amyloidosis, unspecified: Secondary | ICD-10-CM

## 2014-10-25 DIAGNOSIS — I509 Heart failure, unspecified: Secondary | ICD-10-CM | POA: Diagnosis not present

## 2014-10-25 DIAGNOSIS — K5190 Ulcerative colitis, unspecified, without complications: Secondary | ICD-10-CM | POA: Diagnosis present

## 2014-10-25 DIAGNOSIS — N179 Acute kidney failure, unspecified: Secondary | ICD-10-CM | POA: Diagnosis not present

## 2014-10-25 DIAGNOSIS — E860 Dehydration: Secondary | ICD-10-CM | POA: Diagnosis present

## 2014-10-25 DIAGNOSIS — R079 Chest pain, unspecified: Secondary | ICD-10-CM | POA: Diagnosis not present

## 2014-10-25 DIAGNOSIS — J984 Other disorders of lung: Secondary | ICD-10-CM | POA: Diagnosis present

## 2014-10-25 DIAGNOSIS — I959 Hypotension, unspecified: Secondary | ICD-10-CM | POA: Diagnosis present

## 2014-10-25 DIAGNOSIS — Z791 Long term (current) use of non-steroidal anti-inflammatories (NSAID): Secondary | ICD-10-CM

## 2014-10-25 DIAGNOSIS — R0602 Shortness of breath: Secondary | ICD-10-CM

## 2014-10-25 DIAGNOSIS — R000 Tachycardia, unspecified: Secondary | ICD-10-CM | POA: Diagnosis not present

## 2014-10-25 DIAGNOSIS — I43 Cardiomyopathy in diseases classified elsewhere: Secondary | ICD-10-CM | POA: Diagnosis present

## 2014-10-25 DIAGNOSIS — E854 Organ-limited amyloidosis: Secondary | ICD-10-CM | POA: Diagnosis present

## 2014-10-25 DIAGNOSIS — I471 Supraventricular tachycardia: Secondary | ICD-10-CM | POA: Diagnosis present

## 2014-10-25 DIAGNOSIS — D72829 Elevated white blood cell count, unspecified: Secondary | ICD-10-CM | POA: Diagnosis present

## 2014-10-25 DIAGNOSIS — I5031 Acute diastolic (congestive) heart failure: Secondary | ICD-10-CM

## 2014-10-25 DIAGNOSIS — J8410 Pulmonary fibrosis, unspecified: Secondary | ICD-10-CM | POA: Diagnosis present

## 2014-10-25 DIAGNOSIS — D472 Monoclonal gammopathy: Secondary | ICD-10-CM | POA: Diagnosis present

## 2014-10-25 DIAGNOSIS — Z885 Allergy status to narcotic agent status: Secondary | ICD-10-CM | POA: Diagnosis not present

## 2014-10-25 DIAGNOSIS — I5033 Acute on chronic diastolic (congestive) heart failure: Principal | ICD-10-CM | POA: Diagnosis present

## 2014-10-25 DIAGNOSIS — K219 Gastro-esophageal reflux disease without esophagitis: Secondary | ICD-10-CM | POA: Diagnosis present

## 2014-10-25 DIAGNOSIS — Z8249 Family history of ischemic heart disease and other diseases of the circulatory system: Secondary | ICD-10-CM

## 2014-10-25 DIAGNOSIS — I425 Other restrictive cardiomyopathy: Secondary | ICD-10-CM | POA: Diagnosis present

## 2014-10-25 DIAGNOSIS — E785 Hyperlipidemia, unspecified: Secondary | ICD-10-CM | POA: Diagnosis present

## 2014-10-25 DIAGNOSIS — R071 Chest pain on breathing: Secondary | ICD-10-CM | POA: Diagnosis not present

## 2014-10-25 DIAGNOSIS — Z79899 Other long term (current) drug therapy: Secondary | ICD-10-CM | POA: Diagnosis not present

## 2014-10-25 DIAGNOSIS — R0902 Hypoxemia: Secondary | ICD-10-CM | POA: Diagnosis present

## 2014-10-25 DIAGNOSIS — M810 Age-related osteoporosis without current pathological fracture: Secondary | ICD-10-CM | POA: Diagnosis present

## 2014-10-25 DIAGNOSIS — Z882 Allergy status to sulfonamides status: Secondary | ICD-10-CM | POA: Diagnosis not present

## 2014-10-25 DIAGNOSIS — E440 Moderate protein-calorie malnutrition: Secondary | ICD-10-CM | POA: Diagnosis not present

## 2014-10-25 DIAGNOSIS — Z743 Need for continuous supervision: Secondary | ICD-10-CM | POA: Diagnosis not present

## 2014-10-25 DIAGNOSIS — J849 Interstitial pulmonary disease, unspecified: Secondary | ICD-10-CM | POA: Diagnosis present

## 2014-10-25 DIAGNOSIS — C801 Malignant (primary) neoplasm, unspecified: Secondary | ICD-10-CM | POA: Diagnosis not present

## 2014-10-25 HISTORY — DX: Malignant (primary) neoplasm, unspecified: C80.1

## 2014-10-25 LAB — COMPREHENSIVE METABOLIC PANEL
ALT: 19 U/L (ref 14–54)
AST: 27 U/L (ref 15–41)
Albumin: 3.9 g/dL (ref 3.5–5.0)
Alkaline Phosphatase: 114 U/L (ref 38–126)
Anion gap: 10 (ref 5–15)
BUN: 26 mg/dL — ABNORMAL HIGH (ref 6–20)
CO2: 26 mmol/L (ref 22–32)
Calcium: 8.7 mg/dL — ABNORMAL LOW (ref 8.9–10.3)
Chloride: 93 mmol/L — ABNORMAL LOW (ref 101–111)
Creatinine, Ser: 1.13 mg/dL — ABNORMAL HIGH (ref 0.44–1.00)
GFR calc Af Amer: 52 mL/min — ABNORMAL LOW (ref >60)
GFR calc non Af Amer: 45 mL/min — ABNORMAL LOW (ref >60)
Glucose, Bld: 103 mg/dL — ABNORMAL HIGH (ref 65–99)
Potassium: 3.9 mmol/L (ref 3.5–5.1)
Sodium: 129 mmol/L — ABNORMAL LOW (ref 135–145)
Total Bilirubin: 1 mg/dL (ref 0.3–1.2)
Total Protein: 6.9 g/dL (ref 6.5–8.1)

## 2014-10-25 LAB — MRSA PCR SCREENING: MRSA by PCR: NEGATIVE

## 2014-10-25 LAB — CBC WITH DIFFERENTIAL/PLATELET
Basophils Absolute: 0 10*3/uL (ref 0.0–0.1)
Basophils Relative: 0 % (ref 0–1)
Eosinophils Absolute: 0.1 10*3/uL (ref 0.0–0.7)
Eosinophils Relative: 1 % (ref 0–5)
HCT: 35.7 % — ABNORMAL LOW (ref 36.0–46.0)
Hemoglobin: 11.8 g/dL — ABNORMAL LOW (ref 12.0–15.0)
Lymphocytes Relative: 7 % — ABNORMAL LOW (ref 12–46)
Lymphs Abs: 0.8 10*3/uL (ref 0.7–4.0)
MCH: 30.6 pg (ref 26.0–34.0)
MCHC: 33.1 g/dL (ref 30.0–36.0)
MCV: 92.5 fL (ref 78.0–100.0)
Monocytes Absolute: 0.5 10*3/uL (ref 0.1–1.0)
Monocytes Relative: 4 % (ref 3–12)
Neutro Abs: 9.6 10*3/uL — ABNORMAL HIGH (ref 1.7–7.7)
Neutrophils Relative %: 88 % — ABNORMAL HIGH (ref 43–77)
Platelets: 170 10*3/uL (ref 150–400)
RBC: 3.86 MIL/uL — ABNORMAL LOW (ref 3.87–5.11)
RDW: 14.3 % (ref 11.5–15.5)
WBC: 11 10*3/uL — ABNORMAL HIGH (ref 4.0–10.5)

## 2014-10-25 LAB — TROPONIN I
Troponin I: <0.03 ng/mL (ref <0.031)
Troponin I: <0.03 ng/mL (ref <0.031)
Troponin I: <0.03 ng/mL (ref <0.031)
Troponin I: <0.03 ng/mL (ref <0.031)

## 2014-10-25 LAB — BRAIN NATRIURETIC PEPTIDE: B Natriuretic Peptide: 184 pg/mL — ABNORMAL HIGH (ref 0.0–100.0)

## 2014-10-25 MED ORDER — ACETAMINOPHEN 325 MG PO TABS
650.0000 mg | ORAL_TABLET | Freq: Once | ORAL | Status: AC
  Administered 2014-10-25: 650 mg via ORAL
  Filled 2014-10-25: qty 2

## 2014-10-25 MED ORDER — CALCIUM CARBONATE 1250 (500 CA) MG PO TABS
1.0000 | ORAL_TABLET | Freq: Every day | ORAL | Status: DC
  Administered 2014-10-25 – 2014-10-26 (×2): 500 mg via ORAL
  Filled 2014-10-25 (×2): qty 1

## 2014-10-25 MED ORDER — FENTANYL CITRATE (PF) 100 MCG/2ML IJ SOLN
50.0000 ug | Freq: Once | INTRAMUSCULAR | Status: AC
  Administered 2014-10-25: 50 ug via INTRAVENOUS
  Filled 2014-10-25: qty 2

## 2014-10-25 MED ORDER — ENSURE ENLIVE PO LIQD: 237.0000 mL | ORAL | Status: DC

## 2014-10-25 MED ORDER — GI COCKTAIL ~~LOC~~
30.0000 mL | Freq: Once | ORAL | Status: AC
  Administered 2014-10-25: 30 mL via ORAL
  Filled 2014-10-25 (×2): qty 30

## 2014-10-25 MED ORDER — SODIUM CHLORIDE 0.9 % IV SOLN: 250.0000 mL | INTRAVENOUS | Status: DC | PRN

## 2014-10-25 MED ORDER — ENSURE ENLIVE PO LIQD
237.0000 mL | Freq: Two times a day (BID) | ORAL | Status: DC
  Administered 2014-10-25: 237 mL via ORAL

## 2014-10-25 MED ORDER — LEVOTHYROXINE SODIUM 25 MCG PO TABS
125.0000 ug | ORAL_TABLET | Freq: Every day | ORAL | Status: DC
  Administered 2014-10-25 – 2014-10-26 (×2): 125 ug via ORAL
  Filled 2014-10-25 (×4): qty 1

## 2014-10-25 MED ORDER — SODIUM CHLORIDE 0.9 % IJ SOLN
3.0000 mL | Freq: Two times a day (BID) | INTRAMUSCULAR | Status: DC
  Administered 2014-10-25 – 2014-10-26 (×2): 3 mL via INTRAVENOUS

## 2014-10-25 MED ORDER — MAGNESIUM CITRATE PO SOLN: 1.0000 | Freq: Once | ORAL | Status: AC | PRN

## 2014-10-25 MED ORDER — PANTOPRAZOLE SODIUM 40 MG PO TBEC
40.0000 mg | DELAYED_RELEASE_TABLET | Freq: Every day | ORAL | Status: DC
  Administered 2014-10-25 – 2014-10-26 (×2): 40 mg via ORAL
  Filled 2014-10-25 (×2): qty 1

## 2014-10-25 MED ORDER — ALPRAZOLAM 0.5 MG PO TABS
0.5000 mg | ORAL_TABLET | Freq: Every evening | ORAL | Status: DC | PRN
  Administered 2014-10-25 – 2014-10-26 (×2): 0.5 mg via ORAL
  Filled 2014-10-25 (×2): qty 1

## 2014-10-25 MED ORDER — FUROSEMIDE 10 MG/ML IJ SOLN
40.0000 mg | Freq: Every day | INTRAMUSCULAR | Status: DC
  Administered 2014-10-26: 40 mg via INTRAVENOUS
  Filled 2014-10-25: qty 4

## 2014-10-25 MED ORDER — ENSURE ENLIVE PO LIQD: 237.0000 mL | Freq: Two times a day (BID) | ORAL | Status: DC

## 2014-10-25 MED ORDER — TRAZODONE HCL 50 MG PO TABS: 25.0000 mg | ORAL_TABLET | Freq: Every evening | ORAL | Status: DC | PRN

## 2014-10-25 MED ORDER — VITAMIN D 1000 UNITS PO TABS
1000.0000 [IU] | ORAL_TABLET | Freq: Every day | ORAL | Status: DC
  Administered 2014-10-25 – 2014-10-26 (×2): 1000 [IU] via ORAL
  Filled 2014-10-25 (×2): qty 1

## 2014-10-25 MED ORDER — ACETAMINOPHEN 650 MG RE SUPP: 650.0000 mg | Freq: Four times a day (QID) | RECTAL | Status: DC | PRN

## 2014-10-25 MED ORDER — BISACODYL 10 MG RE SUPP: 10.0000 mg | Freq: Every day | RECTAL | Status: DC | PRN

## 2014-10-25 MED ORDER — ASPIRIN EC 325 MG PO TBEC
325.0000 mg | DELAYED_RELEASE_TABLET | Freq: Every day | ORAL | Status: DC
  Administered 2014-10-25 – 2014-10-26 (×2): 325 mg via ORAL
  Filled 2014-10-25 (×2): qty 1

## 2014-10-25 MED ORDER — ONDANSETRON HCL 4 MG PO TABS: 4.0000 mg | ORAL_TABLET | Freq: Four times a day (QID) | ORAL | Status: DC | PRN

## 2014-10-25 MED ORDER — FUROSEMIDE 10 MG/ML IJ SOLN
60.0000 mg | Freq: Once | INTRAMUSCULAR | Status: AC
  Administered 2014-10-25: 60 mg via INTRAVENOUS
  Filled 2014-10-25: qty 6

## 2014-10-25 MED ORDER — BOOST / RESOURCE BREEZE PO LIQD
1.0000 | ORAL | Status: DC
  Administered 2014-10-26: 1 via ORAL

## 2014-10-25 MED ORDER — SODIUM CHLORIDE 0.9 % IJ SOLN: 3.0000 mL | INTRAMUSCULAR | Status: DC | PRN

## 2014-10-25 MED ORDER — MORPHINE SULFATE 2 MG/ML IJ SOLN
1.0000 mg | INTRAMUSCULAR | Status: DC | PRN
  Administered 2014-10-25 – 2014-10-26 (×2): 1 mg via INTRAVENOUS
  Filled 2014-10-25 (×2): qty 1

## 2014-10-25 MED ORDER — POTASSIUM CHLORIDE CRYS ER 20 MEQ PO TBCR
20.0000 meq | EXTENDED_RELEASE_TABLET | Freq: Every day | ORAL | Status: DC
  Administered 2014-10-25 – 2014-10-26 (×2): 20 meq via ORAL
  Filled 2014-10-25 (×2): qty 1

## 2014-10-25 MED ORDER — ALUM & MAG HYDROXIDE-SIMETH 200-200-20 MG/5ML PO SUSP
30.0000 mL | Freq: Four times a day (QID) | ORAL | Status: DC | PRN
  Administered 2014-10-25 – 2014-10-26 (×2): 30 mL via ORAL
  Filled 2014-10-25 (×2): qty 30

## 2014-10-25 MED ORDER — SALINE SPRAY 0.65 % NA SOLN: 1.0000 | NASAL | Status: DC | PRN

## 2014-10-25 MED ORDER — MORPHINE SULFATE 2 MG/ML IJ SOLN
2.0000 mg | Freq: Once | INTRAMUSCULAR | Status: AC
  Administered 2014-10-25: 2 mg via INTRAVENOUS
  Filled 2014-10-25: qty 1

## 2014-10-25 MED ORDER — NITROGLYCERIN 2 % TD OINT
1.0000 [in_us] | TOPICAL_OINTMENT | Freq: Once | TRANSDERMAL | Status: AC
  Administered 2014-10-25: 1 [in_us] via TOPICAL
  Filled 2014-10-25: qty 1

## 2014-10-25 MED ORDER — ENOXAPARIN SODIUM 40 MG/0.4ML ~~LOC~~ SOLN
40.0000 mg | Freq: Every day | SUBCUTANEOUS | Status: DC
  Administered 2014-10-25 – 2014-10-26 (×2): 40 mg via SUBCUTANEOUS
  Filled 2014-10-25 (×2): qty 0.4

## 2014-10-25 MED ORDER — ACETAMINOPHEN 325 MG PO TABS: 650.0000 mg | ORAL_TABLET | Freq: Four times a day (QID) | ORAL | Status: DC | PRN

## 2014-10-25 MED ORDER — ONDANSETRON HCL 4 MG/2ML IJ SOLN
4.0000 mg | Freq: Once | INTRAMUSCULAR | Status: AC
  Administered 2014-10-25: 4 mg via INTRAVENOUS
  Filled 2014-10-25: qty 2

## 2014-10-25 MED ORDER — POLYETHYLENE GLYCOL 3350 17 G PO PACK
17.0000 g | PACK | Freq: Every day | ORAL | Status: DC | PRN
Start: 2014-10-25 — End: 2014-10-27

## 2014-10-25 MED ORDER — ONDANSETRON HCL 4 MG/2ML IJ SOLN: 4.0000 mg | Freq: Four times a day (QID) | INTRAMUSCULAR | Status: DC | PRN

## 2014-10-25 NOTE — ED Notes (Signed)
Dr Marin Comment at bedside.

## 2014-10-25 NOTE — ED Notes (Signed)
Placed heat pack behind patient's neck as requested.

## 2014-10-25 NOTE — ED Notes (Signed)
Pt is being treated to cancer at [facility] pt is c/o chest pain and pain all over.

## 2014-10-25 NOTE — ED Notes (Addendum)
   10/25/14 0532  Chest Pain Assessment  Occurrence Today  Chest Pain Location Central chest  Pain Descriptors / Indicators Sharp  chest pain started in left chest and now has radiated to the central chest. Pt took asa 342m prior to ems arrival and blood sugar was 96

## 2014-10-25 NOTE — H&P (Signed)
[facility] History and Physical  Cindy [name] PPI:[id number] DOB: 25-May-1932 DOA: 10/25/2014  Referring physician: Rolland Porter PCP: [name], MD   Chief Complaint: chest pain Cindy [name]  HPI: Cindy [name] is a very pleasant 81 y.o. female with a past medical history that includes amyloidosis followed weekly at [facility], grade 2 diastolic dysfunction, ulcerative colitis, GERD, presents emergency department with the chief complaint of sudden onset chest pain shortness of breath. Initial evaluation in the emergency department reveals chest x-ray consistent with CHF. She reports feeling well over the last several days but awakened this morning with chest pain located center of her chest radiated to her left arm described as a pressure. Associated symptoms include shortness of breath. She denies headache visual disturbances dizziness syncope or near-syncope. She denies nausea vomiting diaphoresis worsening lower extremity edema or orthopnea. She states that she took Gas-X and vinegar thinking she was 7 reflux with no improvement. She also took 4 baby aspirin. Reports pain improved when she gets the emergency room received nitroglycerin ointment. Workup in the emergency department significant for WBCs 11.0 hemoglobin 11.8 sodium 129 chloride 93 creatinine 1.13 serum glucose 103 show troponin negative EKG without acute changes. Chest x-ray diffuse interstitial opacity above baseline with small right pleural effusion consistent with CHF pattern. She is afebrile and not hypoxic. Baseline blood pressure is soft. She is given 60 mg Lasix in the emergency department systolic blood pressure dropped from 107 to 86.    Review of Systems:  10 point review of systems complete and all systems are negative except as indicated in the history of present illness.   Past Medical History  Diagnosis Date  . Osteoporosis, unspecified   . Edema   . Other and unspecified hyperlipidemia   . Unspecified  hypothyroidism   . Diarrhea   . Other and unspecified noninfectious gastroenteritis and colitis(558.9)   . Family history of malignant neoplasm of gastrointestinal tract   . Flatulence, eructation, and gas pain   . GERD (gastroesophageal reflux disease)   . Stricture and stenosis of esophagus   . Esophagitis, unspecified   . Diverticulosis of colon (without mention of hemorrhage)   . Left sided ulcerative colitis   . URI (upper respiratory infection)   . Maxillary sinus mass   . Fibrosis of lung 03/14/2013  . CHF (congestive heart failure)     Amyloidosis  . Cancer    Past Surgical History  Procedure Laterality Date  . Abdominal hysterectomy    . Cholecystectomy    . Thyroidectomy, partial    . Foot surgery    . Yag laser application Right 0/63/0160    Procedure: YAG LASER APPLICATION;  Surgeon: Elta Guadeloupe T. Gershon Crane, MD;  Location: [facility];  Service: Ophthalmology;  Laterality: Right;   Social History:  reports that she has never smoked. She has never used smokeless tobacco. She reports that she does not drink alcohol or use illicit drugs. She is married lives at home with her husband is independent Allergies  Allergen Reactions  . Nebivolol Hcl     Other reaction(s): Other (See Comments) Chest tightness,sob,cough,severe tiredness,confusion  . Codeine Nausea Only  . Sulfa Antibiotics Nausea Only  . Sulfonamide Derivatives Nausea Only    Family History  Problem Relation Age of Onset  . Colon cancer Mother   . Stomach cancer Sister   . Diabetes Son   . Kidney cancer Son   . Adrenal disorder Son   . Heart disease Brother   . Heart attack  Father     CHF     Prior to Admission medications   Medication Sig Start Date End Date Taking? Authorizing Provider  acetaminophen (TYLENOL) 325 MG tablet Take 325 mg by mouth as needed for mild pain or headache.    Historical Provider, MD  ALPRAZolam Duanne Moron) 0.5 MG tablet Take 1 tablet (0.5 mg total) by mouth at bedtime as needed for  anxiety. 03/15/13   Robbie Lis, MD  calcium carbonate (OS-CAL) 600 MG TABS tablet Take 600 mg by mouth daily with breakfast.    Historical Provider, MD  cholecalciferol (VITAMIN D) 1000 UNITS tablet Take 1,000 Units by mouth daily.      Historical Provider, MD  ESTRING 2 MG vaginal ring Place 2 mg vaginally every 3 (three) months.  06/24/13   Historical Provider, MD  levothyroxine (SYNTHROID, LEVOTHROID) 125 MCG tablet Take 1 tablet by mouth daily.    Historical Provider, MD  mesalamine (LIALDA) 1.2 G EC tablet TAKE 2 TABLETS BY MOUTH DAILY WITH BREAKFAST. 10/02/14   Inda Castle, MD  metoprolol succinate (TOPROL-XL) 25 MG 24 hr tablet Take 1 tablet by mouth  daily 09/21/14   Dorothy Spark, MD  omeprazole (PRILOSEC) 20 MG capsule Take 1 capsule (20 mg total) by mouth daily. Patient taking differently: Take 20 mg by mouth 2 (two) times daily before a meal.  01/12/12   Inda Castle, MD  Polyethyl Glycol-Propyl Glycol (SYSTANE OP) Apply to eye. TWICE WEEKLY AS NEEDED    Historical Provider, MD  potassium chloride SA (K-DUR,KLOR-CON) 20 MEQ tablet Take 1 tablet (20 mEq total) by mouth daily. 07/03/14   Dorothy Spark, MD  sodium chloride (OCEAN) 0.65 % nasal spray     Historical Provider, MD  spironolactone (ALDACTONE) 25 MG tablet Take by mouth. 08/10/14 08/10/15  Historical Provider, MD  torsemide (DEMADEX) 10 MG tablet Take by mouth. 08/10/14 08/10/15  Historical Provider, MD   Physical Exam: Filed Vitals:   10/25/14 0630 10/25/14 0700 10/25/14 0730 10/25/14 0738  BP: 93/60 94/54 82/46  86/52  Pulse: 81 83 81 89  Temp:      TempSrc:      Resp: 21 21 19 26   SpO2: 91% 91% 93% 93%    Wt Readings from Last 3 Encounters:  08/30/14 63.05 kg (139 lb)  08/20/14 63.231 kg (139 lb 6.4 oz)  06/28/14 65.772 kg (145 lb)    General:  Appears slightly anxious but comfortable Eyes: PERRL, normal lids, irises & conjunctiva ENT: grossly normal hearing, lips & tongue Neck: no LAD, masses or  thyromegaly Cardiovascular: RRR, no m/r/g. No LE edema. +JVD Telemetry: SR, no arrhythmias  Respiratory: Mild increased work of breathing. Breath sounds with crackles bilateral bases. Hear no wheeze Abdomen: soft, ntnd positive bowel sounds no guarding Skin: no rash or induration seen on limited exam Musculoskeletal: grossly normal tone BUE/BLE Psychiatric: grossly normal mood and affect, speech fluent and appropriate Neurologic: grossly non-focal. Speech clear facial symmetry           Labs on Admission:  Basic Metabolic Panel:  Recent Labs Lab 10/25/14 0542  NA 129*  K 3.9  CL 93*  CO2 26  GLUCOSE 103*  BUN 26*  CREATININE 1.13*  CALCIUM 8.7*   Liver Function Tests:  Recent Labs Lab 10/25/14 0542  AST 27  ALT 19  ALKPHOS 114  BILITOT 1.0  PROT 6.9  ALBUMIN 3.9   No results for input(s): LIPASE, AMYLASE in the last 168 hours. No  results for input(s): AMMONIA in the last 168 hours. CBC:  Recent Labs Lab 10/25/14 0542  WBC 11.0*  NEUTROABS 9.6*  HGB 11.8*  HCT 35.7*  MCV 92.5  PLT 170   Cardiac Enzymes:  Recent Labs Lab 10/25/14 0542 10/25/14 0753  TROPONINI <0.03 <0.03    BNP (last 3 results)  Recent Labs  10/25/14 0542  BNP 184.0*    ProBNP (last 3 results)  Recent Labs  03/26/14 1227  PROBNP 193.0*    CBG: No results for input(s): GLUCAP in the last 168 hours.  Radiological Exams on Admission: Dg Chest Port 1 View  10/25/2014   CLINICAL DATA:  Central chest pain. Shortness of breath. History of amyloidosis.  EXAM: PORTABLE CHEST - 1 VIEW  COMPARISON:  03/26/2014  FINDINGS: There is borderline cardiomegaly. The vascular pedicle is widened and the hila are congested. Diffuse interstitial opacity above baseline with small right pleural effusion.  IMPRESSION: CHF pattern.   Electronically Signed   By: Monte Fantasia M.D.   On: 10/25/2014 06:32    EKG: Independently reviewed. NSR  Assessment/Plan Principal Problem:   Chest pain:  atypical. Likely related to acute on chronic diastolic HF in patient with AL cardiac amyloidosis. Will admit to SD given soft BP. Will cycle cardiac enzymes and obtain serial EKG. She was given 69m lasix in ED and SBP 85 on admission. She will likely need further diuresing but will be done slowly given hypotension. Obtain lipid panel.  Continue aspirin and consider statin.   Active Problems: Acute on chronic diastolic HF. [facility] mild RV systolic dysfunction grade 2 diastolic dysfunction mild LVH. Chest x-ray and exam consistent with CHF. Had similar episode in June presumably related to dexamethasone. Will continue Lasix but judiciously given her hypotension. Monitor daily weights intake and output. I medications include metoprolol hold this for now.   Acute renal failure: [facility] related to dehydration and medications. She will need to continue with Lasix as noted above. Monitor urine output. Hold any other nephrotoxic   Hypotension: Baseline systolic blood pressure range mid 90s to 105. Systolic blood pressure dropped 85 after Lasix given in the emergency department. Will hold any antihypertensives. Will continue Lasix daily and monitor blood pressure closely.    Hyponatremia: Mild. Likely related to above. Will monitor closely     Hypothyroidism: Continue home meds    GERD: History of same. Will continue PPI    ILD (interstitial lung disease)/ AL cardiac amyloidosis: [facility] Chart review indicates started weekly VCD and dexamethasone.      Code Status: limited DVT Prophylaxis: Family Communication: husband at bedside Disposition Plan: home when ready  Time spent: 77minutes  [facility] Pager [phone]

## 2014-10-25 NOTE — ED Provider Notes (Signed)
CSN: [id number]     Arrival date & time 10/25/14  0522 History   First MD Initiated Contact with Patient 10/25/14 [phone]     Chief Complaint  Patient presents with  . Chest Pain     (Consider location/radiation/quality/duration/timing/severity/associated sxs/prior Treatment) HPI  Patient reports she has a history of amyloidosis and is followed at [facility]. She states she has cardiomyopathy. She states she felt fine all day yesterday. She woke up at 2 AM with pain in the center of her chest that radiates down her left arm. She states she's never had it before. She describes it as being sharp and tight. She states she feels short of breath but she denies diaphoresis, nausea, vomiting, or swelling. She thought she had indigestion and she took some vinegar, 2 Gas-X, and one of her reflux pills without improvement. She took 4 baby aspirin after talking to 911. She states breathing makes her pain feel worse, nothing makes it feel better.  Family history she states her father had congestive heart failure, she has 4 brothers who all have some type of heart disease although she does not know what kind. She knows that one brother had a MI.    PCP Dr Luan Pulling  Past Medical History  Diagnosis Date  . Osteoporosis, unspecified   . Edema   . Other and unspecified hyperlipidemia   . Unspecified hypothyroidism   . Diarrhea   . Other and unspecified noninfectious gastroenteritis and colitis(558.9)   . Family history of malignant neoplasm of gastrointestinal tract   . Flatulence, eructation, and gas pain   . GERD (gastroesophageal reflux disease)   . Stricture and stenosis of esophagus   . Esophagitis, unspecified   . Diverticulosis of colon (without mention of hemorrhage)   . Left sided ulcerative colitis   . URI (upper respiratory infection)   . Maxillary sinus mass   . Fibrosis of lung 03/14/2013  . CHF (congestive heart failure)     Amyloidosis  . Cancer    Past Surgical History    Procedure Laterality Date  . Abdominal hysterectomy    . Cholecystectomy    . Thyroidectomy, partial    . Foot surgery    . Yag laser application Right 1/60/7371    Procedure: YAG LASER APPLICATION;  Surgeon: Elta Guadeloupe T. Gershon Crane, MD;  Location: [facility];  Service: Ophthalmology;  Laterality: Right;   Family History  Problem Relation Age of Onset  . Colon cancer Mother   . Stomach cancer Sister   . Diabetes Son   . Kidney cancer Son   . Adrenal disorder Son   . Heart disease Brother   . Heart attack Father     CHF   History  Substance Use Topics  . Smoking status: Never Smoker   . Smokeless tobacco: Never Used  . Alcohol Use: No   Lives at home  OB History    Gravida Para Term Preterm AB TAB SAB Ectopic Multiple Living   3 2 2  1  1         Review of Systems  All other systems reviewed and are negative.     Allergies  Nebivolol hcl; Codeine; Sulfa antibiotics; and Sulfonamide derivatives  Home Medications   Prior to Admission medications   Medication Sig Start Date End Date Taking? Authorizing Provider  acetaminophen (TYLENOL) 325 MG tablet Take 325 mg by mouth as needed for mild pain or headache.    Historical Provider, MD  ALPRAZolam Duanne Moron) 0.5 MG tablet Take  1 tablet (0.5 mg total) by mouth at bedtime as needed for anxiety. 03/15/13   Robbie Lis, MD  calcium carbonate (OS-CAL) 600 MG TABS tablet Take 600 mg by mouth daily with breakfast.    Historical Provider, MD  cholecalciferol (VITAMIN D) 1000 UNITS tablet Take 1,000 Units by mouth daily.      Historical Provider, MD  ESTRING 2 MG vaginal ring Place 2 mg vaginally every 3 (three) months.  06/24/13   Historical Provider, MD  levothyroxine (SYNTHROID, LEVOTHROID) 125 MCG tablet Take 1 tablet by mouth daily.    Historical Provider, MD  mesalamine (LIALDA) 1.2 G EC tablet TAKE 2 TABLETS BY MOUTH DAILY WITH BREAKFAST. 10/02/14   Inda Castle, MD  metoprolol succinate (TOPROL-XL) 25 MG 24 hr tablet Take 1 tablet by  mouth  daily 09/21/14   Dorothy Spark, MD  omeprazole (PRILOSEC) 20 MG capsule Take 1 capsule (20 mg total) by mouth daily. Patient taking differently: Take 20 mg by mouth 2 (two) times daily before a meal.  01/12/12   Inda Castle, MD  Polyethyl Glycol-Propyl Glycol (SYSTANE OP) Apply to eye. TWICE WEEKLY AS NEEDED    Historical Provider, MD  potassium chloride SA (K-DUR,KLOR-CON) 20 MEQ tablet Take 1 tablet (20 mEq total) by mouth daily. 07/03/14   Dorothy Spark, MD  sodium chloride (OCEAN) 0.65 % nasal spray     Historical Provider, MD  spironolactone (ALDACTONE) 25 MG tablet Take by mouth. 08/10/14 08/10/15  Historical Provider, MD  torsemide (DEMADEX) 10 MG tablet Take by mouth. 08/10/14 08/10/15  Historical Provider, MD   BP 107/62 mmHg  Pulse 78  Temp(Src) 97.4 F (36.3 C) (Oral)  Resp 18  SpO2 90% on PA  Vital signs normal except for borderline hypoxia  Physical Exam  Constitutional: She is oriented to person, place, and time.  Non-toxic appearance. She does not appear ill. She appears distressed.  Frail elderly female  HENT:  Head: Normocephalic and atraumatic.  Right Ear: External ear normal.  Left Ear: External ear normal.  Nose: Nose normal. No mucosal edema or rhinorrhea.  Mouth/Throat: Oropharynx is clear and moist and mucous membranes are normal. No dental abscesses or uvula swelling.  Eyes: Conjunctivae and EOM are normal. Pupils are equal, round, and reactive to light.  Neck: Normal range of motion and full passive range of motion without pain. Neck supple.  Cardiovascular: Normal rate, regular rhythm and normal heart sounds.  Exam reveals no gallop and no friction rub.   No murmur heard. Pt clasping her chest  Pulmonary/Chest: Effort normal and breath sounds normal. No respiratory distress. She has no wheezes. She has no rhonchi. She has no rales. She exhibits no tenderness and no crepitus.    Few rales at bases Area of pain noted  Abdominal: Soft. Normal  appearance and bowel sounds are normal. She exhibits no distension. There is no tenderness. There is no rebound and no guarding.  Musculoskeletal: Normal range of motion. She exhibits no edema or tenderness.  Moves all extremities well.   Neurological: She is alert and oriented to person, place, and time. She has normal strength. No cranial nerve deficit.  Skin: Skin is warm, dry and intact. No rash noted. No erythema. No pallor.  Psychiatric: Her speech is normal and behavior is normal. Her mood appears anxious.  Nursing note and vitals reviewed.   ED Course  Procedures (including critical care time) Medications  fentaNYL (SUBLIMAZE) injection 50 mcg (50 mcg Intravenous Given  10/25/14 0550)  ondansetron (ZOFRAN) injection 4 mg (4 mg Intravenous Given 10/25/14 0550)  nitroGLYCERIN (NITROGLYN) 2 % ointment 1 inch (1 inch Topical Given 10/25/14 0550)  acetaminophen (TYLENOL) tablet 650 mg (650 mg Oral Given 10/25/14 0550)  furosemide (LASIX) injection 60 mg (60 mg Intravenous Given 10/25/14 0618)  morphine 2 MG/ML injection 2 mg (2 mg Intravenous Given 10/25/14 0701)   Patient was given nitroglycerin 1 inch to the chest with acetaminophen for her chest pain, and IV Zofran for her complaints of nausea. Her pulse ox was 90 % on RA and she had oxygen 2 lpm [place] placed. She was also given fentanyl for her pain.  6:15 AM I reviewed patient's chest x-ray which looks like congestive heart failure. IV Lasix was ordered. This was discussed with the patient and her family.  6:45 AM pt still c/o chest pain, BP dropped to 86/42 with NTG and/or fentanyl, now is 93/60 which she states is her normal BP. I did not want to give a fluid bolus since I felt she was in CHF. Her test results were discussed with the patient and her son.   Recheck at 07:00 Discussed patient should be admitted for diuresis and patient and family in agreement.   07:22 Dr Marin Comment, admit to stepdown, Dr Luan Pulling attending.   Records from [place], Cyndie Mull, MD - 10/16/2014 9:06 AM EDT INTERSTITIAL LUNG DISEASE PROGRAM  Clinical Summary: 81 y.o. female initially referred to the [facility] clinic in March 2016 by Dr. Luan Pulling [facility]) for further evaluation of possible fibrotic lung disease. The patient carries the diagnosis of diastolic heart failure since December 2014. At that time, she had exertional dyspnea and leg swelling. She was referred to cardiology (Dr. Ena Dawley) and treated with PO Lasix with good result. She was referred to cardiac rehab which she completed at [facility] in April 2015. She reported no significant issues with exertional dyspnea and improved performance status after completion of rehab. She did well up until late November 2015 when she developed a dry cough, exertional dyspnea, and leg swelling. The lasix dose was doubled and a beta blocker added to her regimen with some relief. Due to persistence in her symptoms of dyspnea, she was sent for a chest X-ray which showed prominent interstitial changes. This finding prompted referral to a pulmonologist (Dr. Melvyn Novas) in late December 2015. He found no airflow obstruction and only mild restriction. She was treated with a short prednisone taper. Her symptoms improved somewhat although she still had exertional dyspnea that was associated with generalized fatigue. She was seen by her primary physician Dr. Luan Pulling who ordered a chest CT. The chest CT revealed small bilateral pleural effusions R>L, septal thickening with associated areas of ground glass opacification, and a denser focal infiltrate in the RUL. Based on these findings, she was referred to the [facility] clinic.  On initial evaluation, the patient described symptoms of exertional dyspnea when climbing stairs or steep inclines and walking long distances. Her walking is also limited by generalized fatigue and weakness. Her previous cough is now mostly resolved which she attributes to  the use of Mucinex. Sputum is mostly clear with only an occasional yellow color and never any hemoptysis. She denies any wheezing, chest tightness, or chest pain. Leg edema is only minimal. She is a never smoker. There is a strong family history of heart disease but no lung disease. 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  She lives in a house with no known mold issues. There are no pets at home.   PFTs revealed mild obstruction, moderate restriction, and a decreased diffusion capacity (47% pred). A six minute walk test done on RA showed desaturation to only 92%. Review of outside chest CT revealed small bilateral pleural effusions, scattered ground glass opacities, and septal thickening that was most consistent with pulmonary edema. Echocardiography was ordered and revealed a pattern of restrictive cardiomyopathy. She was referred to [facility]. Cardiac MRI was scheduled and revealed changes consistent with cardiac amyloidosis. She was admitted to [facility] 4/15-22/2016 for IV diuresis and further work-up of amyloidosis. She was found to have elevated serum free light chains. She was referred to Dr. Amalia Hailey ([place]). He began treatment for AL cardiac amyloidosis with bortezomib, cyclophosphamide and dex (VCD) (+ NEOD001 vs placebo on VITAL study) earlier this month.  Interval History: Ms. Pietsch returns to ILD clinic for follow-up. Since her initial visit, she has been treated by [facility] for heart failure with volume overload and has just recently begun treatment for cardiac amyloidosis. Her symptoms of exertional dyspnea are much improved since she was hospitalized at [facility] for IV diuresis. She is on daily Torsemide and weighs herself religiously. She denies any significant cough, wheezing, or chest pain. She did have a recent admission to the [facility] service for palpitations. She was found to have atrial tachycardia which resolved after IV diuresis. Volume overload  was felt secondary to increased fluid intake in the setting of chemotherapy. She denies recurrence in palpitations since discharge. Her only significant complaint today is generalized fatigue. She was seen by [facility] and Cardiology earlier today.   Impression: Interstitial lung disease in a patient with newly diagnosed cardiac amyloidosis. Appearance on chest CT could be consistent with pulmonary vascular congestion but pulmonary amyloidosis is also a possibility. Initiated on therapy for systemic amyloidosis (primary AL disease) earlier this month. Symptoms of exertional dyspnea improved after aggressive diuresis.  Plan:  Given systemic chemotherapy for amyloidosis, I do not think that biopsy of lung to evaluate for pulmonary amyloidosis is necessary at this point. She is to continue to follow-up with [facility] for heart failure management and [place] for treatment of systemic amyloidosis. Follow-up in ILD clinic will be arranged in 4 months with PFTs to be repeated at that time.    Overview:   - 06/27/2014 Echo: EF >55%, LVIDd 3.9cm, LVIDs 2.4cm, LA 3.5cm, GLS= -14%; mild LVH. Mod MR, rheumatic, restricted motion of posterior leaflet. MildTR, trivial PR. Restrictive filling pattern, Grade 3-4. Right pleural effusion.  - 11/ 2014; EF 41%; grade 2 diastolic dysfunction; mild moderate MR; normal IVC size and collapse - 02/2013: CTA - no PE but pulmonary edema and increased pulmonary vascular markings - 08/06/2014 Adenosine stress cardiac MRI:  1. LV normal. Noted though focal hypertrophy at the basal inferior and lateral wall to max. 12 mm, and basal sigmoid septum. LVEF 60%. There are no regional wall motion abnormalities. Noted though near absence of basal descent towards the apex, suggestive of diastolic dysfunction.  2. RV normal. Global systolic function is mildly reduced with hypokinesia of the apical segments. 3. The left atrium is moderately dilated, absence of atrial  contractions noted. The right atrium is mildly enlarged. 4. The aortic valve is trileaflet, there is no aortic valve stenosis or regurgitation. There is moderate mitral and tricuspid valve regurgitation, and mild pulmonic insufficiency. 5. Delayed enhancement imaging for viability is abnormal. There is patchy  hyperenhancement in the basal inferior and lateral wall, which is non-ischemic pattern. In addition, marked enhancement of the left atrial wall. These findings are suggestive of an infiltrative process, for example amyloidosis.  6. Adenosine stress perfusion imaging demonstrates mild perfusion defects in the basal lateral wall. This may be secondary to the infiltrative process seen on delayed enhancement or focal ischemia in the LCx territory.  7. No LV thrombus seen. 8. Moderate right and small left pleural effusions. Incidental note of 14 mm cyst-like structure in the liver seen.  Labs Review Results for orders placed or performed during the hospital encounter of 10/25/14  Comprehensive metabolic panel  Result Value Ref Range   Sodium 129 (L) 135 - 145 mmol/L   Potassium 3.9 3.5 - 5.1 mmol/L   Chloride 93 (L) 101 - 111 mmol/L   CO2 26 22 - 32 mmol/L   Glucose, Bld 103 (H) 65 - 99 mg/dL   BUN 26 (H) 6 - 20 mg/dL   Creatinine, Ser 1.13 (H) 0.44 - 1.00 mg/dL   Calcium 8.7 (L) 8.9 - 10.3 mg/dL   Total Protein 6.9 6.5 - 8.1 g/dL   Albumin 3.9 3.5 - 5.0 g/dL   AST 27 15 - 41 U/L   ALT 19 14 - 54 U/L   Alkaline Phosphatase 114 38 - 126 U/L   Total Bilirubin 1.0 0.3 - 1.2 mg/dL   GFR calc non Af Amer 45 (L) >60 mL/min   GFR calc Af Amer 52 (L) >60 mL/min   Anion gap 10 5 - 15  Brain natriuretic peptide  Result Value Ref Range   B Natriuretic Peptide 184.0 (H) 0.0 - 100.0 pg/mL  Troponin I  Result Value Ref Range   Troponin I <0.03 <0.031 ng/mL  CBC with Differential  Result Value Ref Range   WBC 11.0 (H) 4.0 - 10.5 K/uL   RBC 3.86 (L) 3.87 - 5.11 MIL/uL   Hemoglobin 11.8  (L) 12.0 - 15.0 g/dL   HCT 35.7 (L) 36.0 - 46.0 %   MCV 92.5 78.0 - 100.0 fL   MCH 30.6 26.0 - 34.0 pg   MCHC 33.1 30.0 - 36.0 g/dL   RDW 14.3 11.5 - 15.5 %   Platelets 170 150 - 400 K/uL   Neutrophils Relative % 88 (H) 43 - 77 %   Neutro Abs 9.6 (H) 1.7 - 7.7 K/uL   Lymphocytes Relative 7 (L) 12 - 46 %   Lymphs Abs 0.8 0.7 - 4.0 K/uL   Monocytes Relative 4 3 - 12 %   Monocytes Absolute 0.5 0.1 - 1.0 K/uL   Eosinophils Relative 1 0 - 5 %   Eosinophils Absolute 0.1 0.0 - 0.7 K/uL   Basophils Relative 0 0 - 1 %   Basophils Absolute 0.0 0.0 - 0.1 K/uL   Laboratory interpretation all normal except mild anemia, mild leukocytosis, hyponatremia, renal insufficiency     Imaging Review Dg Chest Port 1 View  10/25/2014   CLINICAL DATA:  Central chest pain. Shortness of breath. History of amyloidosis.  EXAM: PORTABLE CHEST - 1 VIEW  COMPARISON:  03/26/2014  FINDINGS: There is borderline cardiomegaly. The vascular pedicle is widened and the hila are congested. Diffuse interstitial opacity above baseline with small right pleural effusion.  IMPRESSION: CHF pattern.   Electronically Signed   By: Monte Fantasia M.D.   On: 10/25/2014 06:32     EKG Interpretation   Date/Time:  Thursday October 25 2014 05:28:09 EDT Ventricular Rate:  94  PR Interval:  185 QRS Duration: 78 QT Interval:  376 QTC Calculation: 428 R Axis:   47 Text Interpretation:  Sinus rhythm Anterior infarct, old Since last  tracing rate slower 13 Mar 2013 Confirmed by [facility]  MD-I, Ha Shannahan ([id number]) on  10/25/2014 5:35:40 AM      MDM   Final diagnoses:  Chest pain, unspecified chest pain type  SOB (shortness of breath)  Acute diastolic congestive heart failure  Amyloidosis    Plan admission  Rolland Porter, MD, Barbette Or, MD 10/25/14 [phone]

## 2014-10-25 NOTE — ED Notes (Signed)
   10/25/14 0533  Cardiac  Cardiac (WDL) X  Cardiac Regularity Regular  Cardiac Rhythm NSR  Bedside Cardiac Monitor On Yes  Bedside Cardiac Audible Yes  Bedside Cardiac Alarms Set Yes  Cap Refill >3 Sec  Antiarrhythmic device No

## 2014-10-25 NOTE — Progress Notes (Signed)
Initial Nutrition Assessment  DOCUMENTATION CODES: Non-severe (moderate) malnutrition in context of chronic illness  INTERVENTION: Ensure Enlive daily (each supplement provides 350kcal and 20 grams of protein)   Resource Breeze po Daily, each supplement provides 250 kcal and 9 grams of protein  Recommend MVI   Gave some diet education on increasing calorie/protein intake  NUTRITION DIAGNOSIS: Inadequate oral intake related to fatigue, gastric/chest pain upon eating and lack of appetite as evidenced by moderate depletions of muscle mass, moderate depletion of body fat and loss of another 5 lbs in the last 4 months  GOAL: Patient will meet greater than or equal to 90% of their needs  MONITOR: PO intake, Supplement acceptance, Labs, Weight trends, I & O's  REASON FOR ASSESSMENT: Malnutrition Screening Tool    ASSESSMENT: 81 y.o. female PMHx  amyloidosis, grade 2 diastolic dysfunction, UC, GERD, presents to ED with the chief complaint of sudden onset chest pain + SOB. Initial evaluation in the ED reveals chest x-ray consistent with CHF.  Spoke with Pt and her husband at bedside. Pt states that in 2014 she was found to have the blood disorder of amyloidosis. She reports that this is what has caused her CHF. She has started struggling to eat since that time. She has a decreased appetite. Her husband states she eats "very little". She also suffers from severe pain that can last for "5 days at a time". Husband reports pt is exhausted much of the time and will sleep after spending time out of the house. There has been multiple changes to her medications and they believe this has caused many of her symptoms.   Due to pt's decreased intake and weight loss, RD discussed tips on how to increase protein and calories. Recommended adding sauces/dressings to foods to increase calories. Also emphasized fats/protein sources over carbohydrates. Discussed supplements she could drink/eat to gain weight.    Pt has been constipated. She has been on and off of fluid restrictions and my guess is this is what has caused her constipation.   Since her dx with the disease she says she has lost 25-40 lbs. Looking at [facility] documentation, she appears to have lost ~15-20 lbs since 2014, 5 lbs in the last 4 months  As of now pt reports having very little appetite. Would like to have Resource Breeze in morning and the Ensure later in the day. Declined snacks  Physical assessment reveals generalized moderate fat/muscle wasting. No edema at this time.   Height: Ht Readings from Last 1 Encounters:  10/25/14 5' 8"  (1.727 m)    Weight: Wt Readings from Last 1 Encounters:  10/25/14 140 lb 6.9 oz (63.7 kg)    Ideal Body Weight:  63.6 kg  Wt Readings from Last 10 Encounters:  10/25/14 140 lb 6.9 oz (63.7 kg)  08/30/14 139 lb (63.05 kg)  08/20/14 139 lb 6.4 oz (63.231 kg)  06/28/14 145 lb (65.772 kg)  05/01/14 143 lb (64.864 kg)  04/17/14 151 lb 9.6 oz (68.765 kg)  04/06/14 147 lb (66.679 kg)  03/26/14 145 lb 9.6 oz (66.044 kg)  01/19/14 147 lb (66.679 kg)  01/05/14 147 lb (66.679 kg)  Lost ~ 5 lbs in 4 months  BMI:  Body mass index is 21.36 kg/(m^2).  Estimated Nutritional Needs: Kcal:  1600-1700 (25-27 kcal/kg Protein:  64-76 (1-1.2 g/kg BW) Fluid:  per MD  Skin:  Reviewed, no issues  Diet Order:  Diet Heart Room service appropriate?: Yes; Fluid consistency:: Thin  EDUCATION NEEDS: Education needs  addressed   Intake/Output Summary (Last 24 hours) at 10/25/14 1209 Last data filed at 10/25/14 1100  Gross per 24 hour  Intake    240 ml  Output      0 ml  Net    240 ml    Last BM: Unknown  Burtis Junes RD, LDN Nutrition Pager: [phone] 10/25/2014 12:09 PM

## 2014-10-26 DIAGNOSIS — N179 Acute kidney failure, unspecified: Secondary | ICD-10-CM

## 2014-10-26 DIAGNOSIS — K219 Gastro-esophageal reflux disease without esophagitis: Secondary | ICD-10-CM

## 2014-10-26 DIAGNOSIS — I429 Cardiomyopathy, unspecified: Secondary | ICD-10-CM

## 2014-10-26 DIAGNOSIS — E440 Moderate protein-calorie malnutrition: Secondary | ICD-10-CM

## 2014-10-26 DIAGNOSIS — I9589 Other hypotension: Secondary | ICD-10-CM

## 2014-10-26 DIAGNOSIS — E871 Hypo-osmolality and hyponatremia: Secondary | ICD-10-CM

## 2014-10-26 DIAGNOSIS — R079 Chest pain, unspecified: Secondary | ICD-10-CM

## 2014-10-26 DIAGNOSIS — I5033 Acute on chronic diastolic (congestive) heart failure: Principal | ICD-10-CM

## 2014-10-26 DIAGNOSIS — R000 Tachycardia, unspecified: Secondary | ICD-10-CM

## 2014-10-26 DIAGNOSIS — E859 Amyloidosis, unspecified: Secondary | ICD-10-CM

## 2014-10-26 DIAGNOSIS — J849 Interstitial pulmonary disease, unspecified: Secondary | ICD-10-CM

## 2014-10-26 LAB — COMPREHENSIVE METABOLIC PANEL
ALT: 14 U/L (ref 14–54)
AST: 19 U/L (ref 15–41)
Albumin: 3.1 g/dL — ABNORMAL LOW (ref 3.5–5.0)
Alkaline Phosphatase: 95 U/L (ref 38–126)
Anion gap: 9 (ref 5–15)
BUN: 23 mg/dL — ABNORMAL HIGH (ref 6–20)
CO2: 27 mmol/L (ref 22–32)
Calcium: 8.5 mg/dL — ABNORMAL LOW (ref 8.9–10.3)
Chloride: 95 mmol/L — ABNORMAL LOW (ref 101–111)
Creatinine, Ser: 0.81 mg/dL (ref 0.44–1.00)
GFR calc Af Amer: >60 mL/min (ref >60)
GFR calc non Af Amer: >60 mL/min (ref >60)
Glucose, Bld: 92 mg/dL (ref 65–99)
Potassium: 4.4 mmol/L (ref 3.5–5.1)
Sodium: 131 mmol/L — ABNORMAL LOW (ref 135–145)
Total Bilirubin: 1.4 mg/dL — ABNORMAL HIGH (ref 0.3–1.2)
Total Protein: 5.8 g/dL — ABNORMAL LOW (ref 6.5–8.1)

## 2014-10-26 LAB — CBC
HCT: 31.1 % — ABNORMAL LOW (ref 36.0–46.0)
Hemoglobin: 10.3 g/dL — ABNORMAL LOW (ref 12.0–15.0)
MCH: 30.4 pg (ref 26.0–34.0)
MCHC: 33.1 g/dL (ref 30.0–36.0)
MCV: 91.7 fL (ref 78.0–100.0)
Platelets: 129 10*3/uL — ABNORMAL LOW (ref 150–400)
RBC: 3.39 MIL/uL — ABNORMAL LOW (ref 3.87–5.11)
RDW: 14.3 % (ref 11.5–15.5)
WBC: 8.3 10*3/uL (ref 4.0–10.5)

## 2014-10-26 MED ORDER — SENNOSIDES-DOCUSATE SODIUM 8.6-50 MG PO TABS: 2.0000 | ORAL_TABLET | Freq: Every day | ORAL | Status: DC

## 2014-10-26 MED ORDER — FUROSEMIDE 10 MG/ML IJ SOLN: 40.0000 mg | Freq: Two times a day (BID) | INTRAMUSCULAR | Status: DC

## 2014-10-26 MED ORDER — ENSURE ENLIVE PO LIQD
237.0000 mL | Freq: Two times a day (BID) | ORAL | Status: DC
  Administered 2014-10-26: 237 mL via ORAL

## 2014-10-26 MED ORDER — ALPRAZOLAM 0.5 MG PO TABS: 0.5000 mg | ORAL_TABLET | Freq: Three times a day (TID) | ORAL | Status: DC | PRN

## 2014-10-26 MED ORDER — METOPROLOL TARTRATE 1 MG/ML IV SOLN
2.5000 mg | Freq: Once | INTRAVENOUS | Status: AC
  Administered 2014-10-26: 2.5 mg via INTRAVENOUS
  Filled 2014-10-26: qty 5

## 2014-10-26 MED ORDER — METOPROLOL TARTRATE 25 MG PO TABS
25.0000 mg | ORAL_TABLET | Freq: Two times a day (BID) | ORAL | Status: DC
  Administered 2014-10-26: 25 mg via ORAL
  Filled 2014-10-26: qty 1

## 2014-10-26 MED ORDER — ASPIRIN EC 81 MG PO TBEC
81.0000 mg | DELAYED_RELEASE_TABLET | Freq: Every day | ORAL | Status: DC
Start: 2014-10-27 — End: 2014-10-27

## 2014-10-26 NOTE — Progress Notes (Signed)
[facility] lift flight picked up the patient at 22.30. Report was given at the bedside. Patient's vital signs were stable and patient denied any pain. Patient's husband was notified about patients room 7317 at [facility]. Report was given via the phone to RN accepting the patient at 2320.

## 2014-10-26 NOTE — Progress Notes (Signed)
Dr. Emmit Alexanders from [facility] was called and notified about patient's low SBP in low 70s, HR in 80s. Patient is alert and oriented, denies any pain at this time, feels comfortable. Dr. Emmit Alexanders was reviewing patient's history, no new orders received at this time. Continue to monitor the patient.

## 2014-10-26 NOTE — Consult Note (Signed)
CARDIOLOGY CONSULT NOTE   Patient ID: Cindy [name] MRN: [id number] DOB/AGE: [birth year] 81 y.o.  Admit Date: 10/25/2014 Referring Physician: [name] MD Primary Physician: [name], MD Consulting Cardiologist:[name], [name] MD Primary Cardiologist:  [name] MD [facility]) Oncologist: [name] MD [place]) Reason for Consultation: Tachycardia with hx of cardiac amyloidosis (AL) and CHF  Clinical Summary Cindy [name] is a 81 y.o.female with known history of AL cardiac amyloidosis, Mayo Stage 2, followed by oncology and cardiology at [facility] on chemo weekly. She was last seen at [facility] on 10/23/2014 by oncology. Other history includes ulcerative colitis, acquired hypothyroidism, restrictive cardiomyopathy, and mild restrictive lung disease.   She was seen on 10/23/2014 by oncology and was noted to by hyponatremic :but improved from last visit"  and having palpitations with mild LEE. Dexamethasone was decreased to help with this. She is on a study drug as well, clinical trial [id number] - Prothena - NEOD0001 plus SOC versus Placebo plus SOC, and is seen monthly by oncology.   She was admitted with profound weakness, chest pain and dyspnea.  Pain radiated around her left breast and upper left epigastric area. CXR demonstrated "CHF pattern" diffuse interstitial opacity above baseline with small right pleural effusion.  Labs revealed Na+ 129, CL 93, Creatinine 1.13, Pro-BNP 184. Leukocytosis WBC 11.0; Hgb 11.8, Hct 35.7. She was treated with fentanyl injection, NTG paste, lasix 60 mg IV X1.  EKG revealed NSR with rate of 78 bpm with anterior Q-waves. Follow up EKG demonstrated supraventricular tachycardia with rates in the 140's.   She has not diuresed from lasix of any significance but is now pain free after receiving morphine. HR remains elevated sinus tachycardia vs supraventricular tachycardia. She is coughing productively. Son would like her to be transferred to [facility] where her  oncologist and cardiologist are located. Transfer request has been placed by Dr. Luan Pulling. He has asked local cardiologist to see her in the interim. Dr. Luan Pulling has ordered 2.5 mg of lopressor X1 for HR.    Allergies  Allergen Reactions  . Nebivolol Hcl     Other reaction(s): Other (See Comments) Chest tightness,sob,cough,severe tiredness,confusion  . Codeine Nausea Only  . Sulfa Antibiotics Nausea Only  . Sulfonamide Derivatives Nausea Only    Medications Scheduled Medications: . aspirin EC  325 mg Oral Daily  . calcium carbonate  1 tablet Oral Q breakfast  . cholecalciferol  1,000 Units Oral Daily  . enoxaparin (LOVENOX) injection  40 mg Subcutaneous Daily  . feeding supplement (ENSURE ENLIVE)  237 mL Oral BID BM  . feeding supplement (RESOURCE BREEZE)  1 Container Oral Q24H  . furosemide  40 mg Intravenous Daily  . levothyroxine  125 mcg Oral QAC breakfast  . pantoprazole  40 mg Oral Daily  . potassium chloride SA  20 mEq Oral Daily  . senna-docusate  2 tablet Oral QHS  . sodium chloride  3 mL Intravenous Q12H    Infusions:    PRN Medications: sodium chloride, acetaminophen **OR** acetaminophen, ALPRAZolam, alum & mag hydroxide-simeth, bisacodyl, morphine injection, ondansetron **OR** ondansetron (ZOFRAN) IV, polyethylene glycol, sodium chloride, sodium chloride, traZODone   Past Medical History  Diagnosis Date  . Osteoporosis, unspecified   . Edema   . Other and unspecified hyperlipidemia   . Unspecified hypothyroidism   . Diarrhea   . Other and unspecified noninfectious gastroenteritis and colitis(558.9)   . Family history of malignant neoplasm of gastrointestinal tract   . Flatulence, eructation, and gas pain   . GERD (gastroesophageal  reflux disease)   . Stricture and stenosis of esophagus   . Esophagitis, unspecified   . Diverticulosis of colon (without mention of hemorrhage)   . Left sided ulcerative colitis   . URI (upper respiratory infection)   .  Maxillary sinus mass   . Fibrosis of lung 03/14/2013  . CHF (congestive heart failure)     Amyloidosis  . Cancer     Past Surgical History  Procedure Laterality Date  . Abdominal hysterectomy    . Cholecystectomy    . Thyroidectomy, partial    . Foot surgery    . Yag laser application Right 3/78/5885    Procedure: YAG LASER APPLICATION;  Surgeon: Elta Guadeloupe T. Gershon Crane, MD;  Location: [facility];  Service: Ophthalmology;  Laterality: Right;    Family History  Problem Relation Age of Onset  . Colon cancer Mother   . Stomach cancer Sister   . Diabetes Son   . Kidney cancer Son   . Adrenal disorder Son   . Heart disease Brother   . Heart attack Father     CHF    Social History Cindy [name] reports that she has never smoked. She has never used smokeless tobacco. Cindy [name] reports that she does not drink alcohol.  Review of Systems Complete review of systems are found to be negative unless outlined in H&P above.  Physical Examination Blood pressure 102/59, pulse 138, temperature 98 F (36.7 C), temperature source Oral, resp. rate 27, height 5' 8"  (1.727 m), weight 140 lb 6.9 oz (63.7 kg), SpO2 99 %.  Intake/Output Summary (Last 24 hours) at 10/26/14 1107 Last data filed at 10/25/14 2000  Gross per 24 hour  Intake    600 ml  Output      0 ml  Net    600 ml    Telemetry: Sinus tachycardia rate of 127 bpm  GEN: Weak, frail, coughing.  HEENT: Conjunctiva and lids normal, oropharynx clear with moist mucosa. Neck: Supple, elevated JVP with + HJR,  or carotid bruits, no thyromegaly. Lungs: Diminished in the bases, no wheezes or rhonchi. Wearing O2.  Cardiac: Regular rate and rhythm, tachycardic, no S3 or significant systolic murmur, no pericardial rub. Abdomen: Soft, nontender, no hepatomegaly, bowel sounds present, no guarding or rebound. Extremities: No pitting edema, distal pulses 2+. Skin: Warm and pale, dry. Musculoskeletal: No kyphosis. Neuropsychiatric: Alert and oriented  x3, affect grossly appropriate.  Prior Cardiac Testing/Procedures 03/14/2013 Echo [facility]) Left ventricle: The cavity size was normal. Wall thickness was increased in a pattern of mild LVH. Systolic function was normal. The estimated ejection fraction was in the range of 60% to 65%. Wall motion was normal; there were no regional wall motion abnormalities. Features are consistent with a pseudonormal left ventricular filling pattern, with concomitant abnormal relaxation and increased filling pressure (grade 2 diastolic dysfunction). - Aortic valve: There was no stenosis. - Mitral valve: Mild to moderate regurgitation. - Right ventricle: Poorly visualized. The cavity size was normal. Systolic function was normal. - Tricuspid valve: Peak RV-RA gradient: 62m Hg (S). - Pulmonary arteries: PA peak pressure: 322mHg (S). - Inferior vena cava: The vessel was normal in size; the respirophasic diameter changes were in the normal range (= 50%); findings are consistent with normal central venous pressure.  [facility]  INTERPRETATION --------------------------------------------------------------- NORMAL LEFT VENTRICULAR SYSTOLIC FUNCTION WITH MILD LVH ELEVATED LA PRESSURES WITH DIASTOLIC DYSFUNCTION MILD RV SYSTOLIC DYSFUNCTION (See above) VALVULAR REGURGITATION: MODERATE MR, TRIVIAL PR, MODERATE TR NO VALVULAR STENOSIS  LINEAR DENSITY SEEN IN ASCENDING AORTA, LIKELY IMAGING ARTIFACT (SEEN ON PRIOR ECHO 09/27/2014) Compared with prior Echo study on 09/27/2014: NO SIGNIFICANT CHANGES  Adenosine stress MRI Adenosine stress cardiac MRI with and without contrast  Scan Date:   2014-08-06 11:58:03   1. The left ventricle is normal in cavity size and overall wall thickness. Noted though focal hypertrophy at the basal inferior and lateral wall to max. 12 mm, and basal sigmoid septum. Global systolic function is normal, the LV ejection fraction is 60%.  There are no regional wall motion abnormalities. Noted though near absence of basal descent towards the apex, suggestive of diastolic dysfunction.  2. The right ventricle is normal in cavity size and wall thickness. Global systolic function is mildly reduced with hypokinesia of the apical segments. 3. The left atrium is moderately dilated, absence of atrial contractions noted. The right atrium is mildly enlarged. 4. The aortic valve is trileaflet, there is no aortic valve stenosis or regurgitation. There is moderate mitral and tricuspid valve regurgitation, and mild pulmonic insufficiency. 5. Delayed enhancement imaging for viability is abnormal. There is patchy hyperenhancement in the basal inferior and lateral wall, which is non-ischemic pattern. In addition, marked enhancement of the left atrial wall. These findings are suggestive of an infiltrative process, for example amyloidosis.  6. Adenosine stress perfusion imaging demonstrates mild perfusion defects in the basal lateral wall. This may be secondary to the infiltrative process seen on delayed enhancement or focal ischemia in the LCx territory.  7. No LV thrombus seen. 8. Moderate right and small left pleural effusions. Incidental note of 14 mm cyst-like structure in the liver seen.    Lab Results  Basic Metabolic Panel:  Recent Labs Lab 10/25/14 0542 10/26/14 0434  NA 129* 131*  K 3.9 4.4  CL 93* 95*  CO2 26 27  GLUCOSE 103* 92  BUN 26* 23*  CREATININE 1.13* 0.81  CALCIUM 8.7* 8.5*    Liver Function Tests:  Recent Labs Lab 10/25/14 0542 10/26/14 0434  AST 27 19  ALT 19 14  ALKPHOS 114 95  BILITOT 1.0 1.4*  PROT 6.9 5.8*  ALBUMIN 3.9 3.1*    CBC:  Recent Labs Lab 10/25/14 0542 10/26/14 0434  WBC 11.0* 8.3  NEUTROABS 9.6*  --   HGB 11.8* 10.3*  HCT 35.7* 31.1*  MCV 92.5 91.7  PLT 170 129*    Cardiac Enzymes:  Recent Labs Lab 10/25/14 0542 10/25/14 0753 10/25/14 1421 10/25/14 2127    TROPONINI <0.03 <0.03 <0.03 <0.03    Radiology: Dg Chest Port 1 View  10/25/2014   CLINICAL DATA:  Central chest pain. Shortness of breath. History of amyloidosis.  EXAM: PORTABLE CHEST - 1 VIEW  COMPARISON:  03/26/2014  FINDINGS: There is borderline cardiomegaly. The vascular pedicle is widened and the hila are congested. Diffuse interstitial opacity above baseline with small right pleural effusion.  IMPRESSION: CHF pattern.   Electronically Signed   By: Monte Fantasia M.D.   On: 10/25/2014 06:32     ECG: Current Supraventricular tachycardia rate of 144 bpm.   Impression and Recommendations  1. Supraventricular tachycardia: She has been given IV metoprolol 2.5 mg X1 with some reduction in HR from 140's to low 120's. She denies further chest pain. Review of home medications reveals she is on metoprolol XL 25 mg daily, Last echo from [facility] demonstrated normal EF in June of 2016. Will continue po metoprolol as she was taking at home but in divided doses.  Troponin is negative  X 3.   2. Acute on Chronic Diastolic CHF: Foley catheter has been placed to evaluate urine output closely. She has elevated JVD from restrictive disease. Since insertion, she has diuresed 1400 cc within the last 20 minutes. Review of home medications has her on spironolactone 25 mg daily. Last office visit with oncology on 10/23/2014 listed torsemide 10 mg daily with 40 mg on days of chemotherapy. Creatinine is currently 0.81. Will continue lasix 40 mg IV BID for now and monitor urine output. Will not start back spironolactone at this time.  3. Cardiac Amyloidosis (AL): She is followed by [facility] oncology and is part of a study concerning chemo-therapy. Will defer to oncology at [facility] for further treatment.    [facility] states that they will take patient on transfer when HR is controlled.    Signed: Phill Myron. Lawrence NP [place]  10/26/2014, 11:07 AM Co-Sign MD  The patient was seen and examined, and I agree with the assessment and  plan as documented above, with modifications as noted below.  Pt with aforementioned history of cardiac amyloidosis (AL) and consequent restrictive cardiomyopathy admitted with atypical chest pain and mild acute on chronic diastolic heart failure. BNP 184. I have reviewed all labs, studies, and notes from workup and consequent follow up at [facility]. Most recently evaluated by Dr. Theodis Sato (cardiology) on 10/16/14.  She received IV Lasix earlier this morning and recently put out 1.5 L. Admission weight here says 140 lbs but weighed 137 lbs on her home scale, up from her baseline weight of 132 lbs. She is hypotensive but denies dizziness at rest. Sitting up comfortably in bed.  Troponins normal. Cardiac stress MRI showing infiltrative process vs focal circumflex region of ischemia.  I reviewed all telemetry strips which show sinus tachycardia with clearly discernible P waves (no SVT). During my exam, telemetry automated a report of "atrial fibrillation" but upon close inspection, she was having sinus tachycardia with PAC's in couplets. However, given left atrial infiltrative process, she is certainly at risk for this. HR down to 102 bpm during my exam.  No gross rales on physical exam, JVP 9-10 cm H2O. No pretibial edema. No significant hepatojugular reflux.  Would avoid all calcium channel blockers altogether as these are contraindicated in amyloid cardiomyopathy. Will diurese with IV Lasix 40 mg bid for now. Will withhold home spironolactone due to low SBP in order to facilitate diuresis.  Will reduce ASA to 81 mg. I agree with PPI therapy given her h/o ulcerative colitis and probable GERD, albeit chest pain may simply have been due to mild decompensated diastolic heart failure.  Will treat tachycardia with lower dose and short-acting metoprolol 25 mg bid (had been taking Toprol-XL 37.5 mg bid) given low SBP. However, would not reduce HR too much as tachycardia is compensatory given her CHF and  infiltrative cardiomyopathy. Digoxin could also theoretically be used but no need at this point as HR is gradually coming down with diuresis and short-acting beta blockers. Await transfer to [facility].

## 2014-10-26 NOTE — Progress Notes (Signed)
Subjective: This is a patient with a complicated illness that includes AL amyloidosis of the heart. She is being treated with chemotherapy. She said she started having chest pain about 6 hours before admission. Her pain had been rated as high as an 8 or 9 and she says it's down to about a 4 now. She does not have elevated troponin.  Objective: Vital signs in last 24 hours: Temp:  [97.4 F (36.3 C)-98.3 F (36.8 C)] 98.3 F (36.8 C) (07/08 0400) Pulse Rate:  [79-92] 87 (07/08 0630) Resp:  [16-26] 17 (07/08 0630) BP: (75-98)/(42-67) 93/49 mmHg (07/08 0630) SpO2:  [91 %-100 %] 99 % (07/08 0630) FiO2 (%):  [28 %] 28 % (07/07 1600) Weight:  [63.7 kg (140 lb 6.9 oz)] 63.7 kg (140 lb 6.9 oz) (07/08 0500) Weight change:  Last BM Date: 10/24/14  Intake/Output from previous day: 07/07 0701 - 07/08 0700 In: 840 [P.O.:840] Out: -   PHYSICAL EXAM General appearance: alert, cooperative and moderate distress Resp: clear to auscultation bilaterally Cardio: regular rate and rhythm, S1, S2 normal, no murmur, click, rub or gallop GI: soft, non-tender; bowel sounds normal; no masses,  no organomegaly Extremities: Trace edema  Lab Results:  Results for orders placed or performed during the hospital encounter of 10/25/14 (from the past 48 hour(s))  Comprehensive metabolic panel     Status: Abnormal   Collection Time: 10/25/14  5:42 AM  Result Value Ref Range   Sodium 129 (L) 135 - 145 mmol/L   Potassium 3.9 3.5 - 5.1 mmol/L   Chloride 93 (L) 101 - 111 mmol/L   CO2 26 22 - 32 mmol/L   Glucose, Bld 103 (H) 65 - 99 mg/dL   BUN 26 (H) 6 - 20 mg/dL   Creatinine, Ser 1.13 (H) 0.44 - 1.00 mg/dL   Calcium 8.7 (L) 8.9 - 10.3 mg/dL   Total Protein 6.9 6.5 - 8.1 g/dL   Albumin 3.9 3.5 - 5.0 g/dL   AST 27 15 - 41 U/L   ALT 19 14 - 54 U/L   Alkaline Phosphatase 114 38 - 126 U/L   Total Bilirubin 1.0 0.3 - 1.2 mg/dL   GFR calc non Af Amer 45 (L) >60 mL/min   GFR calc Af Amer 52 (L) >60 mL/min     Comment: (NOTE) The eGFR has been calculated using the CKD EPI equation. This calculation has not been validated in all clinical situations. eGFR's persistently <60 mL/min signify possible Chronic Kidney Disease.    Anion gap 10 5 - 15  Brain natriuretic peptide     Status: Abnormal   Collection Time: 10/25/14  5:42 AM  Result Value Ref Range   B Natriuretic Peptide 184.0 (H) 0.0 - 100.0 pg/mL  Troponin I     Status: None   Collection Time: 10/25/14  5:42 AM  Result Value Ref Range   Troponin I <0.03 <0.031 ng/mL    Comment:        NO INDICATION OF MYOCARDIAL INJURY.   CBC with Differential     Status: Abnormal   Collection Time: 10/25/14  5:42 AM  Result Value Ref Range   WBC 11.0 (H) 4.0 - 10.5 K/uL   RBC 3.86 (L) 3.87 - 5.11 MIL/uL   Hemoglobin 11.8 (L) 12.0 - 15.0 g/dL   HCT 35.7 (L) 36.0 - 46.0 %   MCV 92.5 78.0 - 100.0 fL   MCH 30.6 26.0 - 34.0 pg   MCHC 33.1 30.0 - 36.0 g/dL  RDW 14.3 11.5 - 15.5 %   Platelets 170 150 - 400 K/uL   Neutrophils Relative % 88 (H) 43 - 77 %   Neutro Abs 9.6 (H) 1.7 - 7.7 K/uL   Lymphocytes Relative 7 (L) 12 - 46 %   Lymphs Abs 0.8 0.7 - 4.0 K/uL   Monocytes Relative 4 3 - 12 %   Monocytes Absolute 0.5 0.1 - 1.0 K/uL   Eosinophils Relative 1 0 - 5 %   Eosinophils Absolute 0.1 0.0 - 0.7 K/uL   Basophils Relative 0 0 - 1 %   Basophils Absolute 0.0 0.0 - 0.1 K/uL  Troponin I     Status: None   Collection Time: 10/25/14  7:53 AM  Result Value Ref Range   Troponin I <0.03 <0.031 ng/mL    Comment:        NO INDICATION OF MYOCARDIAL INJURY.   Troponin I     Status: None   Collection Time: 10/25/14  2:21 PM  Result Value Ref Range   Troponin I <0.03 <0.031 ng/mL    Comment:        NO INDICATION OF MYOCARDIAL INJURY.   MRSA PCR Screening     Status: None   Collection Time: 10/25/14  3:55 PM  Result Value Ref Range   MRSA by PCR NEGATIVE NEGATIVE    Comment:        The GeneXpert MRSA Assay (FDA approved for NASAL  specimens only), is one component of a comprehensive MRSA colonization surveillance program. It is not intended to diagnose MRSA infection nor to guide or monitor treatment for MRSA infections.   Troponin I     Status: None   Collection Time: 10/25/14  9:27 PM  Result Value Ref Range   Troponin I <0.03 <0.031 ng/mL    Comment:        NO INDICATION OF MYOCARDIAL INJURY.   Comprehensive metabolic panel     Status: Abnormal   Collection Time: 10/26/14  4:34 AM  Result Value Ref Range   Sodium 131 (L) 135 - 145 mmol/L   Potassium 4.4 3.5 - 5.1 mmol/L   Chloride 95 (L) 101 - 111 mmol/L   CO2 27 22 - 32 mmol/L   Glucose, Bld 92 65 - 99 mg/dL   BUN 23 (H) 6 - 20 mg/dL   Creatinine, Ser 0.81 0.44 - 1.00 mg/dL   Calcium 8.5 (L) 8.9 - 10.3 mg/dL   Total Protein 5.8 (L) 6.5 - 8.1 g/dL   Albumin 3.1 (L) 3.5 - 5.0 g/dL   AST 19 15 - 41 U/L   ALT 14 14 - 54 U/L   Alkaline Phosphatase 95 38 - 126 U/L   Total Bilirubin 1.4 (H) 0.3 - 1.2 mg/dL   GFR calc non Af Amer >60 >60 mL/min   GFR calc Af Amer >60 >60 mL/min    Comment: (NOTE) The eGFR has been calculated using the CKD EPI equation. This calculation has not been validated in all clinical situations. eGFR's persistently <60 mL/min signify possible Chronic Kidney Disease.    Anion gap 9 5 - 15  CBC     Status: Abnormal   Collection Time: 10/26/14  4:34 AM  Result Value Ref Range   WBC 8.3 4.0 - 10.5 K/uL   RBC 3.39 (L) 3.87 - 5.11 MIL/uL   Hemoglobin 10.3 (L) 12.0 - 15.0 g/dL   HCT 31.1 (L) 36.0 - 46.0 %   MCV 91.7 78.0 - 100.0 fL  MCH 30.4 26.0 - 34.0 pg   MCHC 33.1 30.0 - 36.0 g/dL   RDW 14.3 11.5 - 15.5 %   Platelets 129 (L) 150 - 400 K/uL    ABGS No results for input(s): PHART, PO2ART, TCO2, HCO3 in the last 72 hours.  Invalid input(s): PCO2 CULTURES Recent Results (from the past 240 hour(s))  MRSA PCR Screening     Status: None   Collection Time: 10/25/14  3:55 PM  Result Value Ref Range Status   MRSA by PCR  NEGATIVE NEGATIVE Final    Comment:        The GeneXpert MRSA Assay (FDA approved for NASAL specimens only), is one component of a comprehensive MRSA colonization surveillance program. It is not intended to diagnose MRSA infection nor to guide or monitor treatment for MRSA infections.    Studies/Results: Dg Chest Port 1 View  10/25/2014   CLINICAL DATA:  Central chest pain. Shortness of breath. History of amyloidosis.  EXAM: PORTABLE CHEST - 1 VIEW  COMPARISON:  03/26/2014  FINDINGS: There is borderline cardiomegaly. The vascular pedicle is widened and the hila are congested. Diffuse interstitial opacity above baseline with small right pleural effusion.  IMPRESSION: CHF pattern.   Electronically Signed   By: Jonathon  Watts M.D.   On: 10/25/2014 06:32    Medications:  Prior to Admission:  Prescriptions prior to admission  Medication Sig Dispense Refill Last Dose  . acetaminophen (TYLENOL) 325 MG tablet Take 325 mg by mouth as needed for mild pain or headache.   10/24/2014 at Unknown time  . acyclovir (ZOVIRAX) 400 MG tablet Take 400 mg by mouth 2 (two) times daily.    10/24/2014 at Unknown time  . ALPRAZolam (XANAX) 0.5 MG tablet Take 1 tablet (0.5 mg total) by mouth at bedtime as needed for anxiety. 30 tablet 3 10/24/2014 at Unknown time  . aspirin 81 MG chewable tablet Chew by mouth.   10/24/2014 at Unknown time  . calcium carbonate (OS-CAL) 600 MG TABS tablet Take 600 mg by mouth daily with breakfast.   10/24/2014 at Unknown time  . cholecalciferol (VITAMIN D) 1000 UNITS tablet Take 1,000 Units by mouth daily.     10/24/2014 at Unknown time  . ESTRING 2 MG vaginal ring Place 2 mg vaginally every 3 (three) months.    Unknown  . levothyroxine (SYNTHROID, LEVOTHROID) 125 MCG tablet Take 1 tablet by mouth daily.   10/25/2014 at Unknown time  . Magnesium Oxide 500 MG TABS Take 500 mg by mouth daily.    10/24/2014 at Unknown time  . mesalamine (LIALDA) 1.2 G EC tablet TAKE 2 TABLETS BY MOUTH DAILY WITH  BREAKFAST. 180 tablet 3 10/24/2014 at Unknown time  . metoprolol succinate (TOPROL-XL) 25 MG 24 hr tablet Take 1 tablet by mouth  daily (Patient taking differently: Take 1 1/2 tablets morning and night.) 90 tablet 3 10/24/2014 at 1700  . omeprazole (PRILOSEC) 20 MG capsule Take 1 capsule (20 mg total) by mouth daily. (Patient taking differently: Take 20 mg by mouth 2 (two) times daily before a meal. ) 30 capsule 0 10/25/2014 at Unknown time  . ondansetron (ZOFRAN) 8 MG tablet Take 8 mg by mouth once.    10/25/2014 at Unknown time  . Polyethyl Glycol-Propyl Glycol (SYSTANE OP) Apply to eye. TWICE WEEKLY AS NEEDED   Past Week at Unknown time  . potassium chloride SA (K-DUR,KLOR-CON) 20 MEQ tablet Take 1 tablet (20 mEq total) by mouth daily. 60 tablet 1 10/25/2014 at Unknown   time  . sodium chloride (OCEAN) 0.65 % nasal spray    10/24/2014 at Unknown time  . spironolactone (ALDACTONE) 25 MG tablet Take 12.5 mg by mouth 2 (two) times daily.    10/24/2014 at Unknown time  . torsemide (DEMADEX) 10 MG tablet Take 10 mg by mouth 2 (two) times daily.    10/24/2014 at Unknown time   Scheduled: . aspirin EC  325 mg Oral Daily  . calcium carbonate  1 tablet Oral Q breakfast  . cholecalciferol  1,000 Units Oral Daily  . enoxaparin (LOVENOX) injection  40 mg Subcutaneous Daily  . feeding supplement (ENSURE ENLIVE)  237 mL Oral BID BM  . feeding supplement (RESOURCE BREEZE)  1 Container Oral Q24H  . furosemide  40 mg Intravenous Daily  . levothyroxine  125 mcg Oral QAC breakfast  . pantoprazole  40 mg Oral Daily  . potassium chloride SA  20 mEq Oral Daily  . senna-docusate  2 tablet Oral QHS  . sodium chloride  3 mL Intravenous Q12H   Continuous:  PRN:sodium chloride, acetaminophen **OR** acetaminophen, ALPRAZolam, alum & mag hydroxide-simeth, bisacodyl, morphine injection, ondansetron **OR** ondansetron (ZOFRAN) IV, polyethylene glycol, sodium chloride, sodium chloride, traZODone  Assesment: She was admitted with chest  pain and has ruled out for MI. She has what appears to be acute on chronic diastolic heart failure. This is related to cardiac amyloid. Her pain is better but not resolved  She continues with hypotension.  She has acute on chronic diastolic heart failure and she is being diuresed but slowly because of her hypotension.  Her situation is complicated by malnutrition  She has what appears to be some element of interstitial lung disease but some of the findings on her x-rays and pulmonary function testing were felt to be secondary to pulmonary edema. .Principal Problem:   Chest pain Active Problems:   Hypothyroidism   GERD   ILD (interstitial lung disease)   Acute renal failure   Hyponatremia   Hypotension   Acute on chronic diastolic heart failure   Malnutrition of moderate degree    Plan: I will request cardiology consultation. Continue current treatments. I don't think there is anything to add at this time. Continue nutritional support  LOS: 1 day   , L 10/26/2014, 7:56 AM   

## 2014-10-26 NOTE — Progress Notes (Signed)
She developed more chest pain and cardiac arrhythmia. I have requested  Cardiology consult and transfer to [facility]

## 2014-10-27 DIAGNOSIS — I425 Other restrictive cardiomyopathy: Secondary | ICD-10-CM | POA: Diagnosis present

## 2014-10-27 DIAGNOSIS — R0602 Shortness of breath: Secondary | ICD-10-CM | POA: Diagnosis not present

## 2014-10-27 DIAGNOSIS — R0902 Hypoxemia: Secondary | ICD-10-CM | POA: Diagnosis present

## 2014-10-27 DIAGNOSIS — E854 Organ-limited amyloidosis: Secondary | ICD-10-CM | POA: Diagnosis present

## 2014-10-27 DIAGNOSIS — I272 Other secondary pulmonary hypertension: Secondary | ICD-10-CM | POA: Diagnosis present

## 2014-10-27 DIAGNOSIS — I484 Atypical atrial flutter: Secondary | ICD-10-CM | POA: Diagnosis not present

## 2014-10-27 DIAGNOSIS — I2789 Other specified pulmonary heart diseases: Secondary | ICD-10-CM | POA: Diagnosis not present

## 2014-10-27 DIAGNOSIS — R262 Difficulty in walking, not elsewhere classified: Secondary | ICD-10-CM | POA: Diagnosis not present

## 2014-10-27 DIAGNOSIS — I4891 Unspecified atrial fibrillation: Secondary | ICD-10-CM | POA: Diagnosis not present

## 2014-10-27 DIAGNOSIS — E876 Hypokalemia: Secondary | ICD-10-CM | POA: Diagnosis not present

## 2014-10-27 DIAGNOSIS — R002 Palpitations: Secondary | ICD-10-CM | POA: Diagnosis not present

## 2014-10-27 DIAGNOSIS — E859 Amyloidosis, unspecified: Secondary | ICD-10-CM | POA: Diagnosis not present

## 2014-10-27 DIAGNOSIS — I5031 Acute diastolic (congestive) heart failure: Secondary | ICD-10-CM | POA: Diagnosis not present

## 2014-10-27 DIAGNOSIS — I509 Heart failure, unspecified: Secondary | ICD-10-CM | POA: Diagnosis not present

## 2014-10-27 DIAGNOSIS — J811 Chronic pulmonary edema: Secondary | ICD-10-CM | POA: Diagnosis not present

## 2014-10-27 DIAGNOSIS — E858 Other amyloidosis: Secondary | ICD-10-CM | POA: Diagnosis not present

## 2014-10-27 DIAGNOSIS — I429 Cardiomyopathy, unspecified: Secondary | ICD-10-CM | POA: Diagnosis not present

## 2014-10-27 DIAGNOSIS — Z90710 Acquired absence of both cervix and uterus: Secondary | ICD-10-CM | POA: Diagnosis not present

## 2014-10-27 DIAGNOSIS — E46 Unspecified protein-calorie malnutrition: Secondary | ICD-10-CM | POA: Diagnosis present

## 2014-10-27 DIAGNOSIS — I340 Nonrheumatic mitral (valve) insufficiency: Secondary | ICD-10-CM | POA: Diagnosis present

## 2014-10-27 DIAGNOSIS — I480 Paroxysmal atrial fibrillation: Secondary | ICD-10-CM | POA: Diagnosis not present

## 2014-10-27 DIAGNOSIS — I471 Supraventricular tachycardia: Secondary | ICD-10-CM | POA: Diagnosis not present

## 2014-10-27 DIAGNOSIS — R339 Retention of urine, unspecified: Secondary | ICD-10-CM | POA: Diagnosis present

## 2014-10-27 DIAGNOSIS — Z6820 Body mass index (BMI) 20.0-20.9, adult: Secondary | ICD-10-CM | POA: Diagnosis not present

## 2014-10-27 DIAGNOSIS — R918 Other nonspecific abnormal finding of lung field: Secondary | ICD-10-CM | POA: Diagnosis not present

## 2014-10-27 DIAGNOSIS — E871 Hypo-osmolality and hyponatremia: Secondary | ICD-10-CM | POA: Diagnosis not present

## 2014-10-27 DIAGNOSIS — I5033 Acute on chronic diastolic (congestive) heart failure: Secondary | ICD-10-CM | POA: Diagnosis not present

## 2014-10-27 DIAGNOSIS — Z882 Allergy status to sulfonamides status: Secondary | ICD-10-CM | POA: Diagnosis not present

## 2014-10-27 DIAGNOSIS — I43 Cardiomyopathy in diseases classified elsewhere: Secondary | ICD-10-CM | POA: Diagnosis present

## 2014-10-27 DIAGNOSIS — K5190 Ulcerative colitis, unspecified, without complications: Secondary | ICD-10-CM | POA: Diagnosis present

## 2014-10-27 DIAGNOSIS — E039 Hypothyroidism, unspecified: Secondary | ICD-10-CM | POA: Diagnosis present

## 2014-10-27 DIAGNOSIS — I361 Nonrheumatic tricuspid (valve) insufficiency: Secondary | ICD-10-CM | POA: Diagnosis present

## 2014-10-27 DIAGNOSIS — J90 Pleural effusion, not elsewhere classified: Secondary | ICD-10-CM | POA: Diagnosis not present

## 2014-10-27 DIAGNOSIS — I441 Atrioventricular block, second degree: Secondary | ICD-10-CM | POA: Diagnosis present

## 2014-10-27 NOTE — Discharge Summary (Signed)
Final diagnoses: Chest pain myocardial infarction ruled out Cardiac amyloidosis Sinus tachycardia with PACs Pulmonary fibrosis Malnutrition of moderate degree Ulcerative colitis GERD Acute on chronic diastolic heart failure Acute kidney injury  This is a 81 year old who has a fairly recent diagnosis of cardiac amyloidosis made . This is the AL type associated with monoclonal gammopathy and she has been undergoing chemotherapy for this. She developed chest pain about 4 hours prior to admission and eventually came to the emergency department. In the emergency department she was found to be having significant chest pain but without acute changes on her EKG and initial troponin level was negative. She was treated with multiple modalities for her chest pain did not resolve and she was admitted to stepdown unit. She was also noted to have some acute on chronic diastolic heart failure appear to have had an acute kidney injury. Her blood pressure was relatively low but she was asymptomatic from that. She was treated with diuresis pain medications medications for esophagitis and her pain was better but did not resolve. Cardiology consultation was obtained and on the morning of discharge she developed tachycardia to about 140. By monitor this looked like it might be atrial fibrillation but EKG did not show that. Multiple troponins were negative. She had cardiology evaluation was treated with Lopressor intravenously and orally and her heart rate came back down. Her chest pain eventually resolved. Discussion with family revealed that they would like to have her transferred to [facility] for further care because of her relationship with the cardiology and oncology team there.  She was transferred to [facility]. She was continued on IV fluids and disposition will be made after her evaluation at [facility] is completed

## 2014-11-18 DIAGNOSIS — E858 Other amyloidosis: Secondary | ICD-10-CM | POA: Diagnosis not present

## 2014-11-18 DIAGNOSIS — I509 Heart failure, unspecified: Secondary | ICD-10-CM | POA: Diagnosis not present

## 2014-11-20 DIAGNOSIS — R918 Other nonspecific abnormal finding of lung field: Secondary | ICD-10-CM | POA: Diagnosis not present

## 2014-11-20 DIAGNOSIS — E859 Amyloidosis, unspecified: Secondary | ICD-10-CM | POA: Diagnosis not present

## 2014-11-20 DIAGNOSIS — I5031 Acute diastolic (congestive) heart failure: Secondary | ICD-10-CM | POA: Diagnosis not present

## 2014-11-20 DIAGNOSIS — I429 Cardiomyopathy, unspecified: Secondary | ICD-10-CM | POA: Diagnosis not present

## 2014-11-20 DIAGNOSIS — R627 Adult failure to thrive: Secondary | ICD-10-CM | POA: Diagnosis not present

## 2014-11-20 DIAGNOSIS — I5033 Acute on chronic diastolic (congestive) heart failure: Secondary | ICD-10-CM | POA: Diagnosis not present

## 2014-11-20 DIAGNOSIS — E858 Other amyloidosis: Secondary | ICD-10-CM | POA: Diagnosis not present

## 2014-11-20 DIAGNOSIS — I4891 Unspecified atrial fibrillation: Secondary | ICD-10-CM | POA: Diagnosis present

## 2014-11-20 DIAGNOSIS — E46 Unspecified protein-calorie malnutrition: Secondary | ICD-10-CM | POA: Diagnosis present

## 2014-11-20 DIAGNOSIS — I349 Nonrheumatic mitral valve disorder, unspecified: Secondary | ICD-10-CM | POA: Diagnosis not present

## 2014-11-20 DIAGNOSIS — R0602 Shortness of breath: Secondary | ICD-10-CM | POA: Diagnosis not present

## 2014-11-20 DIAGNOSIS — Z515 Encounter for palliative care: Secondary | ICD-10-CM | POA: Diagnosis not present

## 2014-11-20 DIAGNOSIS — E871 Hypo-osmolality and hyponatremia: Secondary | ICD-10-CM | POA: Diagnosis present

## 2014-11-20 DIAGNOSIS — J90 Pleural effusion, not elsewhere classified: Secondary | ICD-10-CM | POA: Diagnosis not present

## 2014-11-20 DIAGNOSIS — Z86718 Personal history of other venous thrombosis and embolism: Secondary | ICD-10-CM | POA: Diagnosis not present

## 2014-11-20 DIAGNOSIS — Z7189 Other specified counseling: Secondary | ICD-10-CM | POA: Diagnosis not present

## 2014-11-20 DIAGNOSIS — I425 Other restrictive cardiomyopathy: Secondary | ICD-10-CM | POA: Diagnosis present

## 2014-11-20 DIAGNOSIS — R0600 Dyspnea, unspecified: Secondary | ICD-10-CM | POA: Diagnosis not present

## 2014-11-20 DIAGNOSIS — I509 Heart failure, unspecified: Secondary | ICD-10-CM | POA: Diagnosis not present

## 2014-11-20 DIAGNOSIS — E854 Organ-limited amyloidosis: Secondary | ICD-10-CM | POA: Diagnosis present

## 2014-11-20 DIAGNOSIS — Z006 Encounter for examination for normal comparison and control in clinical research program: Secondary | ICD-10-CM | POA: Diagnosis not present

## 2014-11-20 DIAGNOSIS — E039 Hypothyroidism, unspecified: Secondary | ICD-10-CM | POA: Diagnosis present

## 2014-11-20 DIAGNOSIS — I43 Cardiomyopathy in diseases classified elsewhere: Secondary | ICD-10-CM | POA: Diagnosis present

## 2014-11-20 DIAGNOSIS — K5190 Ulcerative colitis, unspecified, without complications: Secondary | ICD-10-CM | POA: Diagnosis present

## 2014-11-20 DIAGNOSIS — J84115 Respiratory bronchiolitis interstitial lung disease: Secondary | ICD-10-CM | POA: Diagnosis not present

## 2014-11-20 DIAGNOSIS — J849 Interstitial pulmonary disease, unspecified: Secondary | ICD-10-CM | POA: Diagnosis present

## 2014-11-20 DIAGNOSIS — H04129 Dry eye syndrome of unspecified lacrimal gland: Secondary | ICD-10-CM | POA: Diagnosis not present

## 2014-11-20 DIAGNOSIS — I471 Supraventricular tachycardia: Secondary | ICD-10-CM | POA: Diagnosis present

## 2014-11-20 DIAGNOSIS — N959 Unspecified menopausal and perimenopausal disorder: Secondary | ICD-10-CM | POA: Diagnosis not present

## 2014-11-28 DIAGNOSIS — N952 Postmenopausal atrophic vaginitis: Secondary | ICD-10-CM | POA: Diagnosis not present

## 2015-01-08 DIAGNOSIS — H5712 Ocular pain, left eye: Secondary | ICD-10-CM | POA: Diagnosis not present

## 2015-01-18 DIAGNOSIS — Z23 Encounter for immunization: Secondary | ICD-10-CM | POA: Diagnosis not present

## 2015-01-29 DIAGNOSIS — Z23 Encounter for immunization: Secondary | ICD-10-CM | POA: Diagnosis not present

## 2015-01-30 ENCOUNTER — Telehealth: Payer: Self-pay | Admitting: Internal Medicine

## 2015-01-30 NOTE — Telephone Encounter (Signed)
Happily.

## 2015-01-30 NOTE — Telephone Encounter (Signed)
Spoke w/Pt and will CB to sch'd an appt

## 2015-02-27 DIAGNOSIS — N952 Postmenopausal atrophic vaginitis: Secondary | ICD-10-CM | POA: Diagnosis not present

## 2015-03-21 DIAGNOSIS — J84115 Respiratory bronchiolitis interstitial lung disease: Secondary | ICD-10-CM | POA: Diagnosis not present

## 2015-03-21 DIAGNOSIS — I429 Cardiomyopathy, unspecified: Secondary | ICD-10-CM | POA: Diagnosis not present

## 2015-03-21 DIAGNOSIS — I349 Nonrheumatic mitral valve disorder, unspecified: Secondary | ICD-10-CM | POA: Diagnosis not present

## 2015-03-21 DIAGNOSIS — E859 Amyloidosis, unspecified: Secondary | ICD-10-CM | POA: Diagnosis not present

## 2015-03-21 DIAGNOSIS — N959 Unspecified menopausal and perimenopausal disorder: Secondary | ICD-10-CM | POA: Diagnosis not present

## 2015-03-21 DIAGNOSIS — H04129 Dry eye syndrome of unspecified lacrimal gland: Secondary | ICD-10-CM | POA: Diagnosis not present

## 2015-03-21 DIAGNOSIS — K5190 Ulcerative colitis, unspecified, without complications: Secondary | ICD-10-CM | POA: Diagnosis not present

## 2015-03-21 DIAGNOSIS — Z86718 Personal history of other venous thrombosis and embolism: Secondary | ICD-10-CM | POA: Diagnosis not present

## 2015-03-21 DIAGNOSIS — E039 Hypothyroidism, unspecified: Secondary | ICD-10-CM | POA: Diagnosis not present

## 2015-03-22 DIAGNOSIS — J84115 Respiratory bronchiolitis interstitial lung disease: Secondary | ICD-10-CM | POA: Diagnosis not present

## 2015-03-22 DIAGNOSIS — K5190 Ulcerative colitis, unspecified, without complications: Secondary | ICD-10-CM | POA: Diagnosis not present

## 2015-03-22 DIAGNOSIS — I349 Nonrheumatic mitral valve disorder, unspecified: Secondary | ICD-10-CM | POA: Diagnosis not present

## 2015-03-22 DIAGNOSIS — N959 Unspecified menopausal and perimenopausal disorder: Secondary | ICD-10-CM | POA: Diagnosis not present

## 2015-03-22 DIAGNOSIS — I429 Cardiomyopathy, unspecified: Secondary | ICD-10-CM | POA: Diagnosis not present

## 2015-03-22 DIAGNOSIS — E859 Amyloidosis, unspecified: Secondary | ICD-10-CM | POA: Diagnosis not present

## 2015-03-25 DIAGNOSIS — I349 Nonrheumatic mitral valve disorder, unspecified: Secondary | ICD-10-CM | POA: Diagnosis not present

## 2015-03-25 DIAGNOSIS — E859 Amyloidosis, unspecified: Secondary | ICD-10-CM | POA: Diagnosis not present

## 2015-03-25 DIAGNOSIS — J84115 Respiratory bronchiolitis interstitial lung disease: Secondary | ICD-10-CM | POA: Diagnosis not present

## 2015-03-25 DIAGNOSIS — I429 Cardiomyopathy, unspecified: Secondary | ICD-10-CM | POA: Diagnosis not present

## 2015-03-25 DIAGNOSIS — N959 Unspecified menopausal and perimenopausal disorder: Secondary | ICD-10-CM | POA: Diagnosis not present

## 2015-03-25 DIAGNOSIS — K5190 Ulcerative colitis, unspecified, without complications: Secondary | ICD-10-CM | POA: Diagnosis not present

## 2015-03-26 DIAGNOSIS — K5190 Ulcerative colitis, unspecified, without complications: Secondary | ICD-10-CM | POA: Diagnosis not present

## 2015-03-26 DIAGNOSIS — N959 Unspecified menopausal and perimenopausal disorder: Secondary | ICD-10-CM | POA: Diagnosis not present

## 2015-03-26 DIAGNOSIS — I349 Nonrheumatic mitral valve disorder, unspecified: Secondary | ICD-10-CM | POA: Diagnosis not present

## 2015-03-26 DIAGNOSIS — J84115 Respiratory bronchiolitis interstitial lung disease: Secondary | ICD-10-CM | POA: Diagnosis not present

## 2015-03-26 DIAGNOSIS — E859 Amyloidosis, unspecified: Secondary | ICD-10-CM | POA: Diagnosis not present

## 2015-03-26 DIAGNOSIS — I429 Cardiomyopathy, unspecified: Secondary | ICD-10-CM | POA: Diagnosis not present

## 2015-03-27 DIAGNOSIS — E859 Amyloidosis, unspecified: Secondary | ICD-10-CM | POA: Diagnosis not present

## 2015-03-27 DIAGNOSIS — J84115 Respiratory bronchiolitis interstitial lung disease: Secondary | ICD-10-CM | POA: Diagnosis not present

## 2015-03-27 DIAGNOSIS — K5190 Ulcerative colitis, unspecified, without complications: Secondary | ICD-10-CM | POA: Diagnosis not present

## 2015-03-27 DIAGNOSIS — I429 Cardiomyopathy, unspecified: Secondary | ICD-10-CM | POA: Diagnosis not present

## 2015-03-27 DIAGNOSIS — N959 Unspecified menopausal and perimenopausal disorder: Secondary | ICD-10-CM | POA: Diagnosis not present

## 2015-03-27 DIAGNOSIS — I349 Nonrheumatic mitral valve disorder, unspecified: Secondary | ICD-10-CM | POA: Diagnosis not present

## 2015-03-28 DIAGNOSIS — I349 Nonrheumatic mitral valve disorder, unspecified: Secondary | ICD-10-CM | POA: Diagnosis not present

## 2015-03-28 DIAGNOSIS — E859 Amyloidosis, unspecified: Secondary | ICD-10-CM | POA: Diagnosis not present

## 2015-03-28 DIAGNOSIS — J84115 Respiratory bronchiolitis interstitial lung disease: Secondary | ICD-10-CM | POA: Diagnosis not present

## 2015-03-28 DIAGNOSIS — K5190 Ulcerative colitis, unspecified, without complications: Secondary | ICD-10-CM | POA: Diagnosis not present

## 2015-03-28 DIAGNOSIS — N959 Unspecified menopausal and perimenopausal disorder: Secondary | ICD-10-CM | POA: Diagnosis not present

## 2015-03-28 DIAGNOSIS — I429 Cardiomyopathy, unspecified: Secondary | ICD-10-CM | POA: Diagnosis not present

## 2015-04-01 DIAGNOSIS — E859 Amyloidosis, unspecified: Secondary | ICD-10-CM | POA: Diagnosis not present

## 2015-04-01 DIAGNOSIS — I349 Nonrheumatic mitral valve disorder, unspecified: Secondary | ICD-10-CM | POA: Diagnosis not present

## 2015-04-01 DIAGNOSIS — J84115 Respiratory bronchiolitis interstitial lung disease: Secondary | ICD-10-CM | POA: Diagnosis not present

## 2015-04-01 DIAGNOSIS — K5190 Ulcerative colitis, unspecified, without complications: Secondary | ICD-10-CM | POA: Diagnosis not present

## 2015-04-01 DIAGNOSIS — N959 Unspecified menopausal and perimenopausal disorder: Secondary | ICD-10-CM | POA: Diagnosis not present

## 2015-04-01 DIAGNOSIS — I429 Cardiomyopathy, unspecified: Secondary | ICD-10-CM | POA: Diagnosis not present

## 2015-04-03 DIAGNOSIS — K5190 Ulcerative colitis, unspecified, without complications: Secondary | ICD-10-CM | POA: Diagnosis not present

## 2015-04-03 DIAGNOSIS — N959 Unspecified menopausal and perimenopausal disorder: Secondary | ICD-10-CM | POA: Diagnosis not present

## 2015-04-03 DIAGNOSIS — J84115 Respiratory bronchiolitis interstitial lung disease: Secondary | ICD-10-CM | POA: Diagnosis not present

## 2015-04-03 DIAGNOSIS — I349 Nonrheumatic mitral valve disorder, unspecified: Secondary | ICD-10-CM | POA: Diagnosis not present

## 2015-04-03 DIAGNOSIS — E859 Amyloidosis, unspecified: Secondary | ICD-10-CM | POA: Diagnosis not present

## 2015-04-03 DIAGNOSIS — I429 Cardiomyopathy, unspecified: Secondary | ICD-10-CM | POA: Diagnosis not present

## 2015-04-05 DIAGNOSIS — N959 Unspecified menopausal and perimenopausal disorder: Secondary | ICD-10-CM | POA: Diagnosis not present

## 2015-04-05 DIAGNOSIS — I349 Nonrheumatic mitral valve disorder, unspecified: Secondary | ICD-10-CM | POA: Diagnosis not present

## 2015-04-05 DIAGNOSIS — E859 Amyloidosis, unspecified: Secondary | ICD-10-CM | POA: Diagnosis not present

## 2015-04-05 DIAGNOSIS — J84115 Respiratory bronchiolitis interstitial lung disease: Secondary | ICD-10-CM | POA: Diagnosis not present

## 2015-04-05 DIAGNOSIS — I429 Cardiomyopathy, unspecified: Secondary | ICD-10-CM | POA: Diagnosis not present

## 2015-04-05 DIAGNOSIS — K5190 Ulcerative colitis, unspecified, without complications: Secondary | ICD-10-CM | POA: Diagnosis not present

## 2015-04-08 DIAGNOSIS — I349 Nonrheumatic mitral valve disorder, unspecified: Secondary | ICD-10-CM | POA: Diagnosis not present

## 2015-04-08 DIAGNOSIS — J84115 Respiratory bronchiolitis interstitial lung disease: Secondary | ICD-10-CM | POA: Diagnosis not present

## 2015-04-08 DIAGNOSIS — N959 Unspecified menopausal and perimenopausal disorder: Secondary | ICD-10-CM | POA: Diagnosis not present

## 2015-04-08 DIAGNOSIS — E859 Amyloidosis, unspecified: Secondary | ICD-10-CM | POA: Diagnosis not present

## 2015-04-08 DIAGNOSIS — K5190 Ulcerative colitis, unspecified, without complications: Secondary | ICD-10-CM | POA: Diagnosis not present

## 2015-04-08 DIAGNOSIS — I429 Cardiomyopathy, unspecified: Secondary | ICD-10-CM | POA: Diagnosis not present

## 2015-04-09 DIAGNOSIS — N959 Unspecified menopausal and perimenopausal disorder: Secondary | ICD-10-CM | POA: Diagnosis not present

## 2015-04-09 DIAGNOSIS — K5190 Ulcerative colitis, unspecified, without complications: Secondary | ICD-10-CM | POA: Diagnosis not present

## 2015-04-09 DIAGNOSIS — J84115 Respiratory bronchiolitis interstitial lung disease: Secondary | ICD-10-CM | POA: Diagnosis not present

## 2015-04-09 DIAGNOSIS — I349 Nonrheumatic mitral valve disorder, unspecified: Secondary | ICD-10-CM | POA: Diagnosis not present

## 2015-04-09 DIAGNOSIS — I429 Cardiomyopathy, unspecified: Secondary | ICD-10-CM | POA: Diagnosis not present

## 2015-04-09 DIAGNOSIS — E859 Amyloidosis, unspecified: Secondary | ICD-10-CM | POA: Diagnosis not present

## 2015-04-10 DIAGNOSIS — J84115 Respiratory bronchiolitis interstitial lung disease: Secondary | ICD-10-CM | POA: Diagnosis not present

## 2015-04-10 DIAGNOSIS — K5190 Ulcerative colitis, unspecified, without complications: Secondary | ICD-10-CM | POA: Diagnosis not present

## 2015-04-10 DIAGNOSIS — I429 Cardiomyopathy, unspecified: Secondary | ICD-10-CM | POA: Diagnosis not present

## 2015-04-10 DIAGNOSIS — N959 Unspecified menopausal and perimenopausal disorder: Secondary | ICD-10-CM | POA: Diagnosis not present

## 2015-04-10 DIAGNOSIS — E859 Amyloidosis, unspecified: Secondary | ICD-10-CM | POA: Diagnosis not present

## 2015-04-10 DIAGNOSIS — I349 Nonrheumatic mitral valve disorder, unspecified: Secondary | ICD-10-CM | POA: Diagnosis not present

## 2015-04-11 DIAGNOSIS — I349 Nonrheumatic mitral valve disorder, unspecified: Secondary | ICD-10-CM | POA: Diagnosis not present

## 2015-04-11 DIAGNOSIS — E859 Amyloidosis, unspecified: Secondary | ICD-10-CM | POA: Diagnosis not present

## 2015-04-11 DIAGNOSIS — I429 Cardiomyopathy, unspecified: Secondary | ICD-10-CM | POA: Diagnosis not present

## 2015-04-11 DIAGNOSIS — K5190 Ulcerative colitis, unspecified, without complications: Secondary | ICD-10-CM | POA: Diagnosis not present

## 2015-04-11 DIAGNOSIS — N959 Unspecified menopausal and perimenopausal disorder: Secondary | ICD-10-CM | POA: Diagnosis not present

## 2015-04-11 DIAGNOSIS — J84115 Respiratory bronchiolitis interstitial lung disease: Secondary | ICD-10-CM | POA: Diagnosis not present

## 2015-04-12 DIAGNOSIS — I349 Nonrheumatic mitral valve disorder, unspecified: Secondary | ICD-10-CM | POA: Diagnosis not present

## 2015-04-12 DIAGNOSIS — K5190 Ulcerative colitis, unspecified, without complications: Secondary | ICD-10-CM | POA: Diagnosis not present

## 2015-04-12 DIAGNOSIS — I429 Cardiomyopathy, unspecified: Secondary | ICD-10-CM | POA: Diagnosis not present

## 2015-04-12 DIAGNOSIS — N959 Unspecified menopausal and perimenopausal disorder: Secondary | ICD-10-CM | POA: Diagnosis not present

## 2015-04-12 DIAGNOSIS — E859 Amyloidosis, unspecified: Secondary | ICD-10-CM | POA: Diagnosis not present

## 2015-04-12 DIAGNOSIS — J84115 Respiratory bronchiolitis interstitial lung disease: Secondary | ICD-10-CM | POA: Diagnosis not present

## 2015-04-17 DIAGNOSIS — K5190 Ulcerative colitis, unspecified, without complications: Secondary | ICD-10-CM | POA: Diagnosis not present

## 2015-04-17 DIAGNOSIS — N959 Unspecified menopausal and perimenopausal disorder: Secondary | ICD-10-CM | POA: Diagnosis not present

## 2015-04-17 DIAGNOSIS — E859 Amyloidosis, unspecified: Secondary | ICD-10-CM | POA: Diagnosis not present

## 2015-04-17 DIAGNOSIS — I429 Cardiomyopathy, unspecified: Secondary | ICD-10-CM | POA: Diagnosis not present

## 2015-04-17 DIAGNOSIS — I349 Nonrheumatic mitral valve disorder, unspecified: Secondary | ICD-10-CM | POA: Diagnosis not present

## 2015-04-17 DIAGNOSIS — J84115 Respiratory bronchiolitis interstitial lung disease: Secondary | ICD-10-CM | POA: Diagnosis not present

## 2015-04-19 DIAGNOSIS — I349 Nonrheumatic mitral valve disorder, unspecified: Secondary | ICD-10-CM | POA: Diagnosis not present

## 2015-04-19 DIAGNOSIS — J84115 Respiratory bronchiolitis interstitial lung disease: Secondary | ICD-10-CM | POA: Diagnosis not present

## 2015-04-19 DIAGNOSIS — N959 Unspecified menopausal and perimenopausal disorder: Secondary | ICD-10-CM | POA: Diagnosis not present

## 2015-04-19 DIAGNOSIS — K5190 Ulcerative colitis, unspecified, without complications: Secondary | ICD-10-CM | POA: Diagnosis not present

## 2015-04-19 DIAGNOSIS — E859 Amyloidosis, unspecified: Secondary | ICD-10-CM | POA: Diagnosis not present

## 2015-04-19 DIAGNOSIS — I429 Cardiomyopathy, unspecified: Secondary | ICD-10-CM | POA: Diagnosis not present

## 2015-04-21 DIAGNOSIS — J84115 Respiratory bronchiolitis interstitial lung disease: Secondary | ICD-10-CM | POA: Diagnosis not present

## 2015-04-21 DIAGNOSIS — K5190 Ulcerative colitis, unspecified, without complications: Secondary | ICD-10-CM | POA: Diagnosis not present

## 2015-04-21 DIAGNOSIS — H04129 Dry eye syndrome of unspecified lacrimal gland: Secondary | ICD-10-CM | POA: Diagnosis not present

## 2015-04-21 DIAGNOSIS — N959 Unspecified menopausal and perimenopausal disorder: Secondary | ICD-10-CM | POA: Diagnosis not present

## 2015-04-21 DIAGNOSIS — I349 Nonrheumatic mitral valve disorder, unspecified: Secondary | ICD-10-CM | POA: Diagnosis not present

## 2015-04-21 DIAGNOSIS — I429 Cardiomyopathy, unspecified: Secondary | ICD-10-CM | POA: Diagnosis not present

## 2015-04-21 DIAGNOSIS — E039 Hypothyroidism, unspecified: Secondary | ICD-10-CM | POA: Diagnosis not present

## 2015-04-21 DIAGNOSIS — E859 Amyloidosis, unspecified: Secondary | ICD-10-CM | POA: Diagnosis not present

## 2015-04-21 DIAGNOSIS — Z86718 Personal history of other venous thrombosis and embolism: Secondary | ICD-10-CM | POA: Diagnosis not present

## 2015-04-23 DIAGNOSIS — K5190 Ulcerative colitis, unspecified, without complications: Secondary | ICD-10-CM | POA: Diagnosis not present

## 2015-04-23 DIAGNOSIS — N959 Unspecified menopausal and perimenopausal disorder: Secondary | ICD-10-CM | POA: Diagnosis not present

## 2015-04-23 DIAGNOSIS — I349 Nonrheumatic mitral valve disorder, unspecified: Secondary | ICD-10-CM | POA: Diagnosis not present

## 2015-04-23 DIAGNOSIS — J84115 Respiratory bronchiolitis interstitial lung disease: Secondary | ICD-10-CM | POA: Diagnosis not present

## 2015-04-23 DIAGNOSIS — E859 Amyloidosis, unspecified: Secondary | ICD-10-CM | POA: Diagnosis not present

## 2015-04-23 DIAGNOSIS — I429 Cardiomyopathy, unspecified: Secondary | ICD-10-CM | POA: Diagnosis not present

## 2015-04-24 DIAGNOSIS — I429 Cardiomyopathy, unspecified: Secondary | ICD-10-CM | POA: Diagnosis not present

## 2015-04-24 DIAGNOSIS — I349 Nonrheumatic mitral valve disorder, unspecified: Secondary | ICD-10-CM | POA: Diagnosis not present

## 2015-04-24 DIAGNOSIS — K5190 Ulcerative colitis, unspecified, without complications: Secondary | ICD-10-CM | POA: Diagnosis not present

## 2015-04-24 DIAGNOSIS — E859 Amyloidosis, unspecified: Secondary | ICD-10-CM | POA: Diagnosis not present

## 2015-04-24 DIAGNOSIS — N959 Unspecified menopausal and perimenopausal disorder: Secondary | ICD-10-CM | POA: Diagnosis not present

## 2015-04-24 DIAGNOSIS — J84115 Respiratory bronchiolitis interstitial lung disease: Secondary | ICD-10-CM | POA: Diagnosis not present

## 2015-04-26 DIAGNOSIS — J84115 Respiratory bronchiolitis interstitial lung disease: Secondary | ICD-10-CM | POA: Diagnosis not present

## 2015-04-26 DIAGNOSIS — E859 Amyloidosis, unspecified: Secondary | ICD-10-CM | POA: Diagnosis not present

## 2015-04-26 DIAGNOSIS — I349 Nonrheumatic mitral valve disorder, unspecified: Secondary | ICD-10-CM | POA: Diagnosis not present

## 2015-04-26 DIAGNOSIS — I429 Cardiomyopathy, unspecified: Secondary | ICD-10-CM | POA: Diagnosis not present

## 2015-04-26 DIAGNOSIS — N959 Unspecified menopausal and perimenopausal disorder: Secondary | ICD-10-CM | POA: Diagnosis not present

## 2015-04-26 DIAGNOSIS — K5190 Ulcerative colitis, unspecified, without complications: Secondary | ICD-10-CM | POA: Diagnosis not present

## 2015-05-01 DIAGNOSIS — I429 Cardiomyopathy, unspecified: Secondary | ICD-10-CM | POA: Diagnosis not present

## 2015-05-01 DIAGNOSIS — K5190 Ulcerative colitis, unspecified, without complications: Secondary | ICD-10-CM | POA: Diagnosis not present

## 2015-05-01 DIAGNOSIS — E859 Amyloidosis, unspecified: Secondary | ICD-10-CM | POA: Diagnosis not present

## 2015-05-01 DIAGNOSIS — J84115 Respiratory bronchiolitis interstitial lung disease: Secondary | ICD-10-CM | POA: Diagnosis not present

## 2015-05-01 DIAGNOSIS — N959 Unspecified menopausal and perimenopausal disorder: Secondary | ICD-10-CM | POA: Diagnosis not present

## 2015-05-01 DIAGNOSIS — I349 Nonrheumatic mitral valve disorder, unspecified: Secondary | ICD-10-CM | POA: Diagnosis not present

## 2015-05-03 DIAGNOSIS — J84115 Respiratory bronchiolitis interstitial lung disease: Secondary | ICD-10-CM | POA: Diagnosis not present

## 2015-05-03 DIAGNOSIS — N959 Unspecified menopausal and perimenopausal disorder: Secondary | ICD-10-CM | POA: Diagnosis not present

## 2015-05-03 DIAGNOSIS — K5190 Ulcerative colitis, unspecified, without complications: Secondary | ICD-10-CM | POA: Diagnosis not present

## 2015-05-03 DIAGNOSIS — I429 Cardiomyopathy, unspecified: Secondary | ICD-10-CM | POA: Diagnosis not present

## 2015-05-03 DIAGNOSIS — E859 Amyloidosis, unspecified: Secondary | ICD-10-CM | POA: Diagnosis not present

## 2015-05-03 DIAGNOSIS — I349 Nonrheumatic mitral valve disorder, unspecified: Secondary | ICD-10-CM | POA: Diagnosis not present

## 2015-05-07 DIAGNOSIS — J84115 Respiratory bronchiolitis interstitial lung disease: Secondary | ICD-10-CM | POA: Diagnosis not present

## 2015-05-07 DIAGNOSIS — E859 Amyloidosis, unspecified: Secondary | ICD-10-CM | POA: Diagnosis not present

## 2015-05-07 DIAGNOSIS — N959 Unspecified menopausal and perimenopausal disorder: Secondary | ICD-10-CM | POA: Diagnosis not present

## 2015-05-07 DIAGNOSIS — I429 Cardiomyopathy, unspecified: Secondary | ICD-10-CM | POA: Diagnosis not present

## 2015-05-07 DIAGNOSIS — I349 Nonrheumatic mitral valve disorder, unspecified: Secondary | ICD-10-CM | POA: Diagnosis not present

## 2015-05-07 DIAGNOSIS — K5190 Ulcerative colitis, unspecified, without complications: Secondary | ICD-10-CM | POA: Diagnosis not present

## 2015-05-08 DIAGNOSIS — J84115 Respiratory bronchiolitis interstitial lung disease: Secondary | ICD-10-CM | POA: Diagnosis not present

## 2015-05-08 DIAGNOSIS — E859 Amyloidosis, unspecified: Secondary | ICD-10-CM | POA: Diagnosis not present

## 2015-05-08 DIAGNOSIS — K5190 Ulcerative colitis, unspecified, without complications: Secondary | ICD-10-CM | POA: Diagnosis not present

## 2015-05-08 DIAGNOSIS — I349 Nonrheumatic mitral valve disorder, unspecified: Secondary | ICD-10-CM | POA: Diagnosis not present

## 2015-05-08 DIAGNOSIS — I429 Cardiomyopathy, unspecified: Secondary | ICD-10-CM | POA: Diagnosis not present

## 2015-05-08 DIAGNOSIS — N959 Unspecified menopausal and perimenopausal disorder: Secondary | ICD-10-CM | POA: Diagnosis not present

## 2015-05-10 DIAGNOSIS — N959 Unspecified menopausal and perimenopausal disorder: Secondary | ICD-10-CM | POA: Diagnosis not present

## 2015-05-10 DIAGNOSIS — I429 Cardiomyopathy, unspecified: Secondary | ICD-10-CM | POA: Diagnosis not present

## 2015-05-10 DIAGNOSIS — I349 Nonrheumatic mitral valve disorder, unspecified: Secondary | ICD-10-CM | POA: Diagnosis not present

## 2015-05-10 DIAGNOSIS — K5190 Ulcerative colitis, unspecified, without complications: Secondary | ICD-10-CM | POA: Diagnosis not present

## 2015-05-10 DIAGNOSIS — E859 Amyloidosis, unspecified: Secondary | ICD-10-CM | POA: Diagnosis not present

## 2015-05-10 DIAGNOSIS — J84115 Respiratory bronchiolitis interstitial lung disease: Secondary | ICD-10-CM | POA: Diagnosis not present

## 2015-05-14 DIAGNOSIS — E859 Amyloidosis, unspecified: Secondary | ICD-10-CM | POA: Diagnosis not present

## 2015-05-14 DIAGNOSIS — K5190 Ulcerative colitis, unspecified, without complications: Secondary | ICD-10-CM | POA: Diagnosis not present

## 2015-05-14 DIAGNOSIS — I349 Nonrheumatic mitral valve disorder, unspecified: Secondary | ICD-10-CM | POA: Diagnosis not present

## 2015-05-14 DIAGNOSIS — J84115 Respiratory bronchiolitis interstitial lung disease: Secondary | ICD-10-CM | POA: Diagnosis not present

## 2015-05-14 DIAGNOSIS — I429 Cardiomyopathy, unspecified: Secondary | ICD-10-CM | POA: Diagnosis not present

## 2015-05-14 DIAGNOSIS — N959 Unspecified menopausal and perimenopausal disorder: Secondary | ICD-10-CM | POA: Diagnosis not present

## 2015-05-15 DIAGNOSIS — N959 Unspecified menopausal and perimenopausal disorder: Secondary | ICD-10-CM | POA: Diagnosis not present

## 2015-05-15 DIAGNOSIS — E859 Amyloidosis, unspecified: Secondary | ICD-10-CM | POA: Diagnosis not present

## 2015-05-15 DIAGNOSIS — J84115 Respiratory bronchiolitis interstitial lung disease: Secondary | ICD-10-CM | POA: Diagnosis not present

## 2015-05-15 DIAGNOSIS — K5190 Ulcerative colitis, unspecified, without complications: Secondary | ICD-10-CM | POA: Diagnosis not present

## 2015-05-15 DIAGNOSIS — I429 Cardiomyopathy, unspecified: Secondary | ICD-10-CM | POA: Diagnosis not present

## 2015-05-15 DIAGNOSIS — I349 Nonrheumatic mitral valve disorder, unspecified: Secondary | ICD-10-CM | POA: Diagnosis not present

## 2015-05-17 DIAGNOSIS — E859 Amyloidosis, unspecified: Secondary | ICD-10-CM | POA: Diagnosis not present

## 2015-05-17 DIAGNOSIS — I349 Nonrheumatic mitral valve disorder, unspecified: Secondary | ICD-10-CM | POA: Diagnosis not present

## 2015-05-17 DIAGNOSIS — N959 Unspecified menopausal and perimenopausal disorder: Secondary | ICD-10-CM | POA: Diagnosis not present

## 2015-05-17 DIAGNOSIS — I429 Cardiomyopathy, unspecified: Secondary | ICD-10-CM | POA: Diagnosis not present

## 2015-05-17 DIAGNOSIS — J84115 Respiratory bronchiolitis interstitial lung disease: Secondary | ICD-10-CM | POA: Diagnosis not present

## 2015-05-17 DIAGNOSIS — K5190 Ulcerative colitis, unspecified, without complications: Secondary | ICD-10-CM | POA: Diagnosis not present

## 2015-05-20 DIAGNOSIS — E859 Amyloidosis, unspecified: Secondary | ICD-10-CM | POA: Diagnosis not present

## 2015-05-20 DIAGNOSIS — J84115 Respiratory bronchiolitis interstitial lung disease: Secondary | ICD-10-CM | POA: Diagnosis not present

## 2015-05-20 DIAGNOSIS — I429 Cardiomyopathy, unspecified: Secondary | ICD-10-CM | POA: Diagnosis not present

## 2015-05-20 DIAGNOSIS — I349 Nonrheumatic mitral valve disorder, unspecified: Secondary | ICD-10-CM | POA: Diagnosis not present

## 2015-05-20 DIAGNOSIS — N959 Unspecified menopausal and perimenopausal disorder: Secondary | ICD-10-CM | POA: Diagnosis not present

## 2015-05-20 DIAGNOSIS — K5190 Ulcerative colitis, unspecified, without complications: Secondary | ICD-10-CM | POA: Diagnosis not present

## 2015-05-21 DIAGNOSIS — K5190 Ulcerative colitis, unspecified, without complications: Secondary | ICD-10-CM | POA: Diagnosis not present

## 2015-05-21 DIAGNOSIS — J84115 Respiratory bronchiolitis interstitial lung disease: Secondary | ICD-10-CM | POA: Diagnosis not present

## 2015-05-21 DIAGNOSIS — E859 Amyloidosis, unspecified: Secondary | ICD-10-CM | POA: Diagnosis not present

## 2015-05-21 DIAGNOSIS — I429 Cardiomyopathy, unspecified: Secondary | ICD-10-CM | POA: Diagnosis not present

## 2015-05-21 DIAGNOSIS — N959 Unspecified menopausal and perimenopausal disorder: Secondary | ICD-10-CM | POA: Diagnosis not present

## 2015-05-21 DIAGNOSIS — I349 Nonrheumatic mitral valve disorder, unspecified: Secondary | ICD-10-CM | POA: Diagnosis not present

## 2015-05-22 DIAGNOSIS — J84115 Respiratory bronchiolitis interstitial lung disease: Secondary | ICD-10-CM | POA: Diagnosis not present

## 2015-05-22 DIAGNOSIS — N959 Unspecified menopausal and perimenopausal disorder: Secondary | ICD-10-CM | POA: Diagnosis not present

## 2015-05-22 DIAGNOSIS — E859 Amyloidosis, unspecified: Secondary | ICD-10-CM | POA: Diagnosis not present

## 2015-05-22 DIAGNOSIS — K5190 Ulcerative colitis, unspecified, without complications: Secondary | ICD-10-CM | POA: Diagnosis not present

## 2015-05-22 DIAGNOSIS — I349 Nonrheumatic mitral valve disorder, unspecified: Secondary | ICD-10-CM | POA: Diagnosis not present

## 2015-05-22 DIAGNOSIS — H04129 Dry eye syndrome of unspecified lacrimal gland: Secondary | ICD-10-CM | POA: Diagnosis not present

## 2015-05-22 DIAGNOSIS — Z86718 Personal history of other venous thrombosis and embolism: Secondary | ICD-10-CM | POA: Diagnosis not present

## 2015-05-22 DIAGNOSIS — E039 Hypothyroidism, unspecified: Secondary | ICD-10-CM | POA: Diagnosis not present

## 2015-05-22 DIAGNOSIS — I429 Cardiomyopathy, unspecified: Secondary | ICD-10-CM | POA: Diagnosis not present

## 2015-05-24 DIAGNOSIS — I349 Nonrheumatic mitral valve disorder, unspecified: Secondary | ICD-10-CM | POA: Diagnosis not present

## 2015-05-24 DIAGNOSIS — I429 Cardiomyopathy, unspecified: Secondary | ICD-10-CM | POA: Diagnosis not present

## 2015-05-24 DIAGNOSIS — N959 Unspecified menopausal and perimenopausal disorder: Secondary | ICD-10-CM | POA: Diagnosis not present

## 2015-05-24 DIAGNOSIS — J84115 Respiratory bronchiolitis interstitial lung disease: Secondary | ICD-10-CM | POA: Diagnosis not present

## 2015-05-24 DIAGNOSIS — E859 Amyloidosis, unspecified: Secondary | ICD-10-CM | POA: Diagnosis not present

## 2015-05-24 DIAGNOSIS — K5190 Ulcerative colitis, unspecified, without complications: Secondary | ICD-10-CM | POA: Diagnosis not present

## 2015-05-29 DIAGNOSIS — I349 Nonrheumatic mitral valve disorder, unspecified: Secondary | ICD-10-CM | POA: Diagnosis not present

## 2015-05-29 DIAGNOSIS — K5190 Ulcerative colitis, unspecified, without complications: Secondary | ICD-10-CM | POA: Diagnosis not present

## 2015-05-29 DIAGNOSIS — N959 Unspecified menopausal and perimenopausal disorder: Secondary | ICD-10-CM | POA: Diagnosis not present

## 2015-05-29 DIAGNOSIS — I429 Cardiomyopathy, unspecified: Secondary | ICD-10-CM | POA: Diagnosis not present

## 2015-05-29 DIAGNOSIS — J84115 Respiratory bronchiolitis interstitial lung disease: Secondary | ICD-10-CM | POA: Diagnosis not present

## 2015-05-29 DIAGNOSIS — E859 Amyloidosis, unspecified: Secondary | ICD-10-CM | POA: Diagnosis not present

## 2015-05-31 DIAGNOSIS — J84115 Respiratory bronchiolitis interstitial lung disease: Secondary | ICD-10-CM | POA: Diagnosis not present

## 2015-05-31 DIAGNOSIS — N959 Unspecified menopausal and perimenopausal disorder: Secondary | ICD-10-CM | POA: Diagnosis not present

## 2015-05-31 DIAGNOSIS — I349 Nonrheumatic mitral valve disorder, unspecified: Secondary | ICD-10-CM | POA: Diagnosis not present

## 2015-05-31 DIAGNOSIS — I429 Cardiomyopathy, unspecified: Secondary | ICD-10-CM | POA: Diagnosis not present

## 2015-05-31 DIAGNOSIS — E859 Amyloidosis, unspecified: Secondary | ICD-10-CM | POA: Diagnosis not present

## 2015-05-31 DIAGNOSIS — K5190 Ulcerative colitis, unspecified, without complications: Secondary | ICD-10-CM | POA: Diagnosis not present

## 2015-06-03 DIAGNOSIS — K5190 Ulcerative colitis, unspecified, without complications: Secondary | ICD-10-CM | POA: Diagnosis not present

## 2015-06-03 DIAGNOSIS — N959 Unspecified menopausal and perimenopausal disorder: Secondary | ICD-10-CM | POA: Diagnosis not present

## 2015-06-03 DIAGNOSIS — E859 Amyloidosis, unspecified: Secondary | ICD-10-CM | POA: Diagnosis not present

## 2015-06-03 DIAGNOSIS — I349 Nonrheumatic mitral valve disorder, unspecified: Secondary | ICD-10-CM | POA: Diagnosis not present

## 2015-06-03 DIAGNOSIS — I429 Cardiomyopathy, unspecified: Secondary | ICD-10-CM | POA: Diagnosis not present

## 2015-06-03 DIAGNOSIS — J84115 Respiratory bronchiolitis interstitial lung disease: Secondary | ICD-10-CM | POA: Diagnosis not present

## 2015-06-04 DIAGNOSIS — N959 Unspecified menopausal and perimenopausal disorder: Secondary | ICD-10-CM | POA: Diagnosis not present

## 2015-06-04 DIAGNOSIS — I429 Cardiomyopathy, unspecified: Secondary | ICD-10-CM | POA: Diagnosis not present

## 2015-06-04 DIAGNOSIS — I349 Nonrheumatic mitral valve disorder, unspecified: Secondary | ICD-10-CM | POA: Diagnosis not present

## 2015-06-04 DIAGNOSIS — K5190 Ulcerative colitis, unspecified, without complications: Secondary | ICD-10-CM | POA: Diagnosis not present

## 2015-06-04 DIAGNOSIS — E859 Amyloidosis, unspecified: Secondary | ICD-10-CM | POA: Diagnosis not present

## 2015-06-04 DIAGNOSIS — J84115 Respiratory bronchiolitis interstitial lung disease: Secondary | ICD-10-CM | POA: Diagnosis not present

## 2015-06-05 DIAGNOSIS — N952 Postmenopausal atrophic vaginitis: Secondary | ICD-10-CM | POA: Diagnosis not present

## 2015-06-05 DIAGNOSIS — E859 Amyloidosis, unspecified: Secondary | ICD-10-CM | POA: Diagnosis not present

## 2015-06-05 DIAGNOSIS — I429 Cardiomyopathy, unspecified: Secondary | ICD-10-CM | POA: Diagnosis not present

## 2015-06-05 DIAGNOSIS — I349 Nonrheumatic mitral valve disorder, unspecified: Secondary | ICD-10-CM | POA: Diagnosis not present

## 2015-06-05 DIAGNOSIS — N959 Unspecified menopausal and perimenopausal disorder: Secondary | ICD-10-CM | POA: Diagnosis not present

## 2015-06-05 DIAGNOSIS — J84115 Respiratory bronchiolitis interstitial lung disease: Secondary | ICD-10-CM | POA: Diagnosis not present

## 2015-06-05 DIAGNOSIS — K5190 Ulcerative colitis, unspecified, without complications: Secondary | ICD-10-CM | POA: Diagnosis not present

## 2015-06-07 DIAGNOSIS — I429 Cardiomyopathy, unspecified: Secondary | ICD-10-CM | POA: Diagnosis not present

## 2015-06-07 DIAGNOSIS — E859 Amyloidosis, unspecified: Secondary | ICD-10-CM | POA: Diagnosis not present

## 2015-06-07 DIAGNOSIS — K5190 Ulcerative colitis, unspecified, without complications: Secondary | ICD-10-CM | POA: Diagnosis not present

## 2015-06-07 DIAGNOSIS — N959 Unspecified menopausal and perimenopausal disorder: Secondary | ICD-10-CM | POA: Diagnosis not present

## 2015-06-07 DIAGNOSIS — J84115 Respiratory bronchiolitis interstitial lung disease: Secondary | ICD-10-CM | POA: Diagnosis not present

## 2015-06-07 DIAGNOSIS — I349 Nonrheumatic mitral valve disorder, unspecified: Secondary | ICD-10-CM | POA: Diagnosis not present

## 2015-06-11 DIAGNOSIS — I429 Cardiomyopathy, unspecified: Secondary | ICD-10-CM | POA: Diagnosis not present

## 2015-06-11 DIAGNOSIS — E859 Amyloidosis, unspecified: Secondary | ICD-10-CM | POA: Diagnosis not present

## 2015-06-11 DIAGNOSIS — J84115 Respiratory bronchiolitis interstitial lung disease: Secondary | ICD-10-CM | POA: Diagnosis not present

## 2015-06-11 DIAGNOSIS — K5190 Ulcerative colitis, unspecified, without complications: Secondary | ICD-10-CM | POA: Diagnosis not present

## 2015-06-11 DIAGNOSIS — I349 Nonrheumatic mitral valve disorder, unspecified: Secondary | ICD-10-CM | POA: Diagnosis not present

## 2015-06-11 DIAGNOSIS — N959 Unspecified menopausal and perimenopausal disorder: Secondary | ICD-10-CM | POA: Diagnosis not present

## 2015-06-12 DIAGNOSIS — E859 Amyloidosis, unspecified: Secondary | ICD-10-CM | POA: Diagnosis not present

## 2015-06-12 DIAGNOSIS — N959 Unspecified menopausal and perimenopausal disorder: Secondary | ICD-10-CM | POA: Diagnosis not present

## 2015-06-12 DIAGNOSIS — J84115 Respiratory bronchiolitis interstitial lung disease: Secondary | ICD-10-CM | POA: Diagnosis not present

## 2015-06-12 DIAGNOSIS — K5190 Ulcerative colitis, unspecified, without complications: Secondary | ICD-10-CM | POA: Diagnosis not present

## 2015-06-12 DIAGNOSIS — I429 Cardiomyopathy, unspecified: Secondary | ICD-10-CM | POA: Diagnosis not present

## 2015-06-12 DIAGNOSIS — I349 Nonrheumatic mitral valve disorder, unspecified: Secondary | ICD-10-CM | POA: Diagnosis not present

## 2015-06-14 ENCOUNTER — Other Ambulatory Visit: Payer: Self-pay

## 2015-06-14 DIAGNOSIS — E859 Amyloidosis, unspecified: Secondary | ICD-10-CM | POA: Diagnosis not present

## 2015-06-14 DIAGNOSIS — J84115 Respiratory bronchiolitis interstitial lung disease: Secondary | ICD-10-CM | POA: Diagnosis not present

## 2015-06-14 DIAGNOSIS — N959 Unspecified menopausal and perimenopausal disorder: Secondary | ICD-10-CM | POA: Diagnosis not present

## 2015-06-14 DIAGNOSIS — K5190 Ulcerative colitis, unspecified, without complications: Secondary | ICD-10-CM | POA: Diagnosis not present

## 2015-06-14 DIAGNOSIS — I429 Cardiomyopathy, unspecified: Secondary | ICD-10-CM | POA: Diagnosis not present

## 2015-06-14 DIAGNOSIS — I349 Nonrheumatic mitral valve disorder, unspecified: Secondary | ICD-10-CM | POA: Diagnosis not present

## 2015-06-14 MED ORDER — POTASSIUM CHLORIDE CRYS ER 20 MEQ PO TBCR: 20.0000 meq | EXTENDED_RELEASE_TABLET | Freq: Every day | ORAL | Status: DC

## 2015-06-14 NOTE — Telephone Encounter (Signed)
  Call Documentation      Nuala Alpha, LPN at 01/10/3006 62:26 AM     Status: Signed       Expand All Collapse All   Pt calling to cancel her appt with Dr Meda Coffee for this Wednesday 4/27.  Pt states she is being followed by [facility] for some hematology issues and prefers to follow-up with Dr Meda Coffee on a as needed basis for now.  Informed the pt that I will cancel her appt for Wednesday and route this message to Dr Meda Coffee to make her aware of pts decision. Pt verbalized understanding and agrees with this plan.              Comprehensive metabolic panel  Status: Finalresult Visible to patient:  Not Released Nextappt: None             Ref Range 73moago    Sodium 135 - 145 mmol/L 131 (L)   Potassium 3.5 - 5.1 mmol/L 4.4   Chloride 101 - 111 mmol/L 95 (L)   CO2 22 - 32 mmol/L 27   Glucose, Bld 65 - 99 mg/dL 92   BUN 6 - 20 mg/dL 23 (H)   Creatinine, Ser 0.44 - 1.00 mg/dL 0.81   Calcium 8.9 - 10.3 mg/dL 8.5 (L)   Total Protein 6.5 - 8.1 g/dL 5.8 (L)   Albumin 3.5 - 5.0 g/dL 3.1 (L)   AST 15 - 41 U/L 19   ALT 14 - 54 U/L 14   Alkaline Phosphatase 38 - 126 U/L 95   Total Bilirubin 0.3 - 1.2 mg/dL 1.4 (H)   GFR calc non Af Amer >60 mL/min >60   GFR calc Af Amer >60 mL/min >60   Comments: (NOTE)  The eGFR has been calculated using the CKD EPI equation.  This calculation has not been validated in all clinical situations.  eGFR's persistently <60 mL/min signify possible Chronic Kidney  Disease.     Anion gap 5 - 15  9   Resulting Agency [facility]       Specimen Collected: 10/26/14 4:34 AM Last Resulted: 10/26/14 6:10 AM

## 2015-06-19 DIAGNOSIS — I429 Cardiomyopathy, unspecified: Secondary | ICD-10-CM | POA: Diagnosis not present

## 2015-06-19 DIAGNOSIS — H04129 Dry eye syndrome of unspecified lacrimal gland: Secondary | ICD-10-CM | POA: Diagnosis not present

## 2015-06-19 DIAGNOSIS — E859 Amyloidosis, unspecified: Secondary | ICD-10-CM | POA: Diagnosis not present

## 2015-06-19 DIAGNOSIS — K5190 Ulcerative colitis, unspecified, without complications: Secondary | ICD-10-CM | POA: Diagnosis not present

## 2015-06-19 DIAGNOSIS — J84115 Respiratory bronchiolitis interstitial lung disease: Secondary | ICD-10-CM | POA: Diagnosis not present

## 2015-06-19 DIAGNOSIS — I349 Nonrheumatic mitral valve disorder, unspecified: Secondary | ICD-10-CM | POA: Diagnosis not present

## 2015-06-19 DIAGNOSIS — E039 Hypothyroidism, unspecified: Secondary | ICD-10-CM | POA: Diagnosis not present

## 2015-06-19 DIAGNOSIS — Z86718 Personal history of other venous thrombosis and embolism: Secondary | ICD-10-CM | POA: Diagnosis not present

## 2015-06-19 DIAGNOSIS — N959 Unspecified menopausal and perimenopausal disorder: Secondary | ICD-10-CM | POA: Diagnosis not present

## 2015-06-20 DIAGNOSIS — I429 Cardiomyopathy, unspecified: Secondary | ICD-10-CM | POA: Diagnosis not present

## 2015-06-20 DIAGNOSIS — N959 Unspecified menopausal and perimenopausal disorder: Secondary | ICD-10-CM | POA: Diagnosis not present

## 2015-06-20 DIAGNOSIS — J84115 Respiratory bronchiolitis interstitial lung disease: Secondary | ICD-10-CM | POA: Diagnosis not present

## 2015-06-20 DIAGNOSIS — K5190 Ulcerative colitis, unspecified, without complications: Secondary | ICD-10-CM | POA: Diagnosis not present

## 2015-06-20 DIAGNOSIS — E859 Amyloidosis, unspecified: Secondary | ICD-10-CM | POA: Diagnosis not present

## 2015-06-20 DIAGNOSIS — I349 Nonrheumatic mitral valve disorder, unspecified: Secondary | ICD-10-CM | POA: Diagnosis not present

## 2015-06-21 DIAGNOSIS — I429 Cardiomyopathy, unspecified: Secondary | ICD-10-CM | POA: Diagnosis not present

## 2015-06-21 DIAGNOSIS — I349 Nonrheumatic mitral valve disorder, unspecified: Secondary | ICD-10-CM | POA: Diagnosis not present

## 2015-06-21 DIAGNOSIS — E859 Amyloidosis, unspecified: Secondary | ICD-10-CM | POA: Diagnosis not present

## 2015-06-21 DIAGNOSIS — K5190 Ulcerative colitis, unspecified, without complications: Secondary | ICD-10-CM | POA: Diagnosis not present

## 2015-06-21 DIAGNOSIS — N959 Unspecified menopausal and perimenopausal disorder: Secondary | ICD-10-CM | POA: Diagnosis not present

## 2015-06-21 DIAGNOSIS — J84115 Respiratory bronchiolitis interstitial lung disease: Secondary | ICD-10-CM | POA: Diagnosis not present

## 2015-06-25 DIAGNOSIS — J84115 Respiratory bronchiolitis interstitial lung disease: Secondary | ICD-10-CM | POA: Diagnosis not present

## 2015-06-25 DIAGNOSIS — I429 Cardiomyopathy, unspecified: Secondary | ICD-10-CM | POA: Diagnosis not present

## 2015-06-25 DIAGNOSIS — I349 Nonrheumatic mitral valve disorder, unspecified: Secondary | ICD-10-CM | POA: Diagnosis not present

## 2015-06-25 DIAGNOSIS — N959 Unspecified menopausal and perimenopausal disorder: Secondary | ICD-10-CM | POA: Diagnosis not present

## 2015-06-25 DIAGNOSIS — K5190 Ulcerative colitis, unspecified, without complications: Secondary | ICD-10-CM | POA: Diagnosis not present

## 2015-06-25 DIAGNOSIS — E859 Amyloidosis, unspecified: Secondary | ICD-10-CM | POA: Diagnosis not present

## 2015-06-26 DIAGNOSIS — I429 Cardiomyopathy, unspecified: Secondary | ICD-10-CM | POA: Diagnosis not present

## 2015-06-26 DIAGNOSIS — N959 Unspecified menopausal and perimenopausal disorder: Secondary | ICD-10-CM | POA: Diagnosis not present

## 2015-06-26 DIAGNOSIS — I349 Nonrheumatic mitral valve disorder, unspecified: Secondary | ICD-10-CM | POA: Diagnosis not present

## 2015-06-26 DIAGNOSIS — J84115 Respiratory bronchiolitis interstitial lung disease: Secondary | ICD-10-CM | POA: Diagnosis not present

## 2015-06-26 DIAGNOSIS — E859 Amyloidosis, unspecified: Secondary | ICD-10-CM | POA: Diagnosis not present

## 2015-06-26 DIAGNOSIS — K5190 Ulcerative colitis, unspecified, without complications: Secondary | ICD-10-CM | POA: Diagnosis not present

## 2015-06-28 DIAGNOSIS — J84115 Respiratory bronchiolitis interstitial lung disease: Secondary | ICD-10-CM | POA: Diagnosis not present

## 2015-06-28 DIAGNOSIS — K5190 Ulcerative colitis, unspecified, without complications: Secondary | ICD-10-CM | POA: Diagnosis not present

## 2015-06-28 DIAGNOSIS — I349 Nonrheumatic mitral valve disorder, unspecified: Secondary | ICD-10-CM | POA: Diagnosis not present

## 2015-06-28 DIAGNOSIS — N959 Unspecified menopausal and perimenopausal disorder: Secondary | ICD-10-CM | POA: Diagnosis not present

## 2015-06-28 DIAGNOSIS — I429 Cardiomyopathy, unspecified: Secondary | ICD-10-CM | POA: Diagnosis not present

## 2015-06-28 DIAGNOSIS — E859 Amyloidosis, unspecified: Secondary | ICD-10-CM | POA: Diagnosis not present

## 2015-07-01 DIAGNOSIS — I429 Cardiomyopathy, unspecified: Secondary | ICD-10-CM | POA: Diagnosis not present

## 2015-07-01 DIAGNOSIS — N959 Unspecified menopausal and perimenopausal disorder: Secondary | ICD-10-CM | POA: Diagnosis not present

## 2015-07-01 DIAGNOSIS — J84115 Respiratory bronchiolitis interstitial lung disease: Secondary | ICD-10-CM | POA: Diagnosis not present

## 2015-07-01 DIAGNOSIS — I349 Nonrheumatic mitral valve disorder, unspecified: Secondary | ICD-10-CM | POA: Diagnosis not present

## 2015-07-01 DIAGNOSIS — K5190 Ulcerative colitis, unspecified, without complications: Secondary | ICD-10-CM | POA: Diagnosis not present

## 2015-07-01 DIAGNOSIS — E859 Amyloidosis, unspecified: Secondary | ICD-10-CM | POA: Diagnosis not present

## 2015-07-02 DIAGNOSIS — N959 Unspecified menopausal and perimenopausal disorder: Secondary | ICD-10-CM | POA: Diagnosis not present

## 2015-07-02 DIAGNOSIS — J84115 Respiratory bronchiolitis interstitial lung disease: Secondary | ICD-10-CM | POA: Diagnosis not present

## 2015-07-02 DIAGNOSIS — E859 Amyloidosis, unspecified: Secondary | ICD-10-CM | POA: Diagnosis not present

## 2015-07-02 DIAGNOSIS — K5190 Ulcerative colitis, unspecified, without complications: Secondary | ICD-10-CM | POA: Diagnosis not present

## 2015-07-02 DIAGNOSIS — I429 Cardiomyopathy, unspecified: Secondary | ICD-10-CM | POA: Diagnosis not present

## 2015-07-02 DIAGNOSIS — I349 Nonrheumatic mitral valve disorder, unspecified: Secondary | ICD-10-CM | POA: Diagnosis not present

## 2015-07-03 DIAGNOSIS — I429 Cardiomyopathy, unspecified: Secondary | ICD-10-CM | POA: Diagnosis not present

## 2015-07-03 DIAGNOSIS — J84115 Respiratory bronchiolitis interstitial lung disease: Secondary | ICD-10-CM | POA: Diagnosis not present

## 2015-07-03 DIAGNOSIS — E859 Amyloidosis, unspecified: Secondary | ICD-10-CM | POA: Diagnosis not present

## 2015-07-03 DIAGNOSIS — N959 Unspecified menopausal and perimenopausal disorder: Secondary | ICD-10-CM | POA: Diagnosis not present

## 2015-07-03 DIAGNOSIS — K5190 Ulcerative colitis, unspecified, without complications: Secondary | ICD-10-CM | POA: Diagnosis not present

## 2015-07-03 DIAGNOSIS — I349 Nonrheumatic mitral valve disorder, unspecified: Secondary | ICD-10-CM | POA: Diagnosis not present

## 2015-07-04 DIAGNOSIS — I349 Nonrheumatic mitral valve disorder, unspecified: Secondary | ICD-10-CM | POA: Diagnosis not present

## 2015-07-04 DIAGNOSIS — J84115 Respiratory bronchiolitis interstitial lung disease: Secondary | ICD-10-CM | POA: Diagnosis not present

## 2015-07-04 DIAGNOSIS — I429 Cardiomyopathy, unspecified: Secondary | ICD-10-CM | POA: Diagnosis not present

## 2015-07-04 DIAGNOSIS — E859 Amyloidosis, unspecified: Secondary | ICD-10-CM | POA: Diagnosis not present

## 2015-07-04 DIAGNOSIS — N959 Unspecified menopausal and perimenopausal disorder: Secondary | ICD-10-CM | POA: Diagnosis not present

## 2015-07-04 DIAGNOSIS — K5190 Ulcerative colitis, unspecified, without complications: Secondary | ICD-10-CM | POA: Diagnosis not present

## 2015-07-05 DIAGNOSIS — I349 Nonrheumatic mitral valve disorder, unspecified: Secondary | ICD-10-CM | POA: Diagnosis not present

## 2015-07-05 DIAGNOSIS — J84115 Respiratory bronchiolitis interstitial lung disease: Secondary | ICD-10-CM | POA: Diagnosis not present

## 2015-07-05 DIAGNOSIS — N959 Unspecified menopausal and perimenopausal disorder: Secondary | ICD-10-CM | POA: Diagnosis not present

## 2015-07-05 DIAGNOSIS — I429 Cardiomyopathy, unspecified: Secondary | ICD-10-CM | POA: Diagnosis not present

## 2015-07-05 DIAGNOSIS — K5190 Ulcerative colitis, unspecified, without complications: Secondary | ICD-10-CM | POA: Diagnosis not present

## 2015-07-05 DIAGNOSIS — E859 Amyloidosis, unspecified: Secondary | ICD-10-CM | POA: Diagnosis not present

## 2015-07-09 DIAGNOSIS — N959 Unspecified menopausal and perimenopausal disorder: Secondary | ICD-10-CM | POA: Diagnosis not present

## 2015-07-09 DIAGNOSIS — I349 Nonrheumatic mitral valve disorder, unspecified: Secondary | ICD-10-CM | POA: Diagnosis not present

## 2015-07-09 DIAGNOSIS — I429 Cardiomyopathy, unspecified: Secondary | ICD-10-CM | POA: Diagnosis not present

## 2015-07-09 DIAGNOSIS — K5190 Ulcerative colitis, unspecified, without complications: Secondary | ICD-10-CM | POA: Diagnosis not present

## 2015-07-09 DIAGNOSIS — E859 Amyloidosis, unspecified: Secondary | ICD-10-CM | POA: Diagnosis not present

## 2015-07-09 DIAGNOSIS — J84115 Respiratory bronchiolitis interstitial lung disease: Secondary | ICD-10-CM | POA: Diagnosis not present

## 2015-07-10 DIAGNOSIS — N959 Unspecified menopausal and perimenopausal disorder: Secondary | ICD-10-CM | POA: Diagnosis not present

## 2015-07-10 DIAGNOSIS — I429 Cardiomyopathy, unspecified: Secondary | ICD-10-CM | POA: Diagnosis not present

## 2015-07-10 DIAGNOSIS — I349 Nonrheumatic mitral valve disorder, unspecified: Secondary | ICD-10-CM | POA: Diagnosis not present

## 2015-07-10 DIAGNOSIS — J84115 Respiratory bronchiolitis interstitial lung disease: Secondary | ICD-10-CM | POA: Diagnosis not present

## 2015-07-10 DIAGNOSIS — E859 Amyloidosis, unspecified: Secondary | ICD-10-CM | POA: Diagnosis not present

## 2015-07-10 DIAGNOSIS — K5190 Ulcerative colitis, unspecified, without complications: Secondary | ICD-10-CM | POA: Diagnosis not present

## 2015-07-11 DIAGNOSIS — J84115 Respiratory bronchiolitis interstitial lung disease: Secondary | ICD-10-CM | POA: Diagnosis not present

## 2015-07-11 DIAGNOSIS — K5190 Ulcerative colitis, unspecified, without complications: Secondary | ICD-10-CM | POA: Diagnosis not present

## 2015-07-11 DIAGNOSIS — I429 Cardiomyopathy, unspecified: Secondary | ICD-10-CM | POA: Diagnosis not present

## 2015-07-11 DIAGNOSIS — E859 Amyloidosis, unspecified: Secondary | ICD-10-CM | POA: Diagnosis not present

## 2015-07-11 DIAGNOSIS — I349 Nonrheumatic mitral valve disorder, unspecified: Secondary | ICD-10-CM | POA: Diagnosis not present

## 2015-07-11 DIAGNOSIS — N959 Unspecified menopausal and perimenopausal disorder: Secondary | ICD-10-CM | POA: Diagnosis not present

## 2015-07-12 DIAGNOSIS — J84115 Respiratory bronchiolitis interstitial lung disease: Secondary | ICD-10-CM | POA: Diagnosis not present

## 2015-07-12 DIAGNOSIS — I429 Cardiomyopathy, unspecified: Secondary | ICD-10-CM | POA: Diagnosis not present

## 2015-07-12 DIAGNOSIS — I349 Nonrheumatic mitral valve disorder, unspecified: Secondary | ICD-10-CM | POA: Diagnosis not present

## 2015-07-12 DIAGNOSIS — N959 Unspecified menopausal and perimenopausal disorder: Secondary | ICD-10-CM | POA: Diagnosis not present

## 2015-07-12 DIAGNOSIS — K5190 Ulcerative colitis, unspecified, without complications: Secondary | ICD-10-CM | POA: Diagnosis not present

## 2015-07-12 DIAGNOSIS — E859 Amyloidosis, unspecified: Secondary | ICD-10-CM | POA: Diagnosis not present

## 2015-07-15 DIAGNOSIS — E859 Amyloidosis, unspecified: Secondary | ICD-10-CM | POA: Diagnosis not present

## 2015-07-15 DIAGNOSIS — N959 Unspecified menopausal and perimenopausal disorder: Secondary | ICD-10-CM | POA: Diagnosis not present

## 2015-07-15 DIAGNOSIS — J84115 Respiratory bronchiolitis interstitial lung disease: Secondary | ICD-10-CM | POA: Diagnosis not present

## 2015-07-15 DIAGNOSIS — I349 Nonrheumatic mitral valve disorder, unspecified: Secondary | ICD-10-CM | POA: Diagnosis not present

## 2015-07-15 DIAGNOSIS — K5190 Ulcerative colitis, unspecified, without complications: Secondary | ICD-10-CM | POA: Diagnosis not present

## 2015-07-15 DIAGNOSIS — I429 Cardiomyopathy, unspecified: Secondary | ICD-10-CM | POA: Diagnosis not present

## 2015-07-16 DIAGNOSIS — I349 Nonrheumatic mitral valve disorder, unspecified: Secondary | ICD-10-CM | POA: Diagnosis not present

## 2015-07-16 DIAGNOSIS — J84115 Respiratory bronchiolitis interstitial lung disease: Secondary | ICD-10-CM | POA: Diagnosis not present

## 2015-07-16 DIAGNOSIS — E859 Amyloidosis, unspecified: Secondary | ICD-10-CM | POA: Diagnosis not present

## 2015-07-16 DIAGNOSIS — I429 Cardiomyopathy, unspecified: Secondary | ICD-10-CM | POA: Diagnosis not present

## 2015-07-16 DIAGNOSIS — N959 Unspecified menopausal and perimenopausal disorder: Secondary | ICD-10-CM | POA: Diagnosis not present

## 2015-07-16 DIAGNOSIS — K5190 Ulcerative colitis, unspecified, without complications: Secondary | ICD-10-CM | POA: Diagnosis not present

## 2015-07-17 DIAGNOSIS — J84115 Respiratory bronchiolitis interstitial lung disease: Secondary | ICD-10-CM | POA: Diagnosis not present

## 2015-07-17 DIAGNOSIS — E859 Amyloidosis, unspecified: Secondary | ICD-10-CM | POA: Diagnosis not present

## 2015-07-17 DIAGNOSIS — I429 Cardiomyopathy, unspecified: Secondary | ICD-10-CM | POA: Diagnosis not present

## 2015-07-17 DIAGNOSIS — I349 Nonrheumatic mitral valve disorder, unspecified: Secondary | ICD-10-CM | POA: Diagnosis not present

## 2015-07-17 DIAGNOSIS — K5190 Ulcerative colitis, unspecified, without complications: Secondary | ICD-10-CM | POA: Diagnosis not present

## 2015-07-17 DIAGNOSIS — N959 Unspecified menopausal and perimenopausal disorder: Secondary | ICD-10-CM | POA: Diagnosis not present

## 2015-07-19 DIAGNOSIS — J84115 Respiratory bronchiolitis interstitial lung disease: Secondary | ICD-10-CM | POA: Diagnosis not present

## 2015-07-19 DIAGNOSIS — N959 Unspecified menopausal and perimenopausal disorder: Secondary | ICD-10-CM | POA: Diagnosis not present

## 2015-07-19 DIAGNOSIS — I349 Nonrheumatic mitral valve disorder, unspecified: Secondary | ICD-10-CM | POA: Diagnosis not present

## 2015-07-19 DIAGNOSIS — K5190 Ulcerative colitis, unspecified, without complications: Secondary | ICD-10-CM | POA: Diagnosis not present

## 2015-07-19 DIAGNOSIS — E859 Amyloidosis, unspecified: Secondary | ICD-10-CM | POA: Diagnosis not present

## 2015-07-19 DIAGNOSIS — I429 Cardiomyopathy, unspecified: Secondary | ICD-10-CM | POA: Diagnosis not present

## 2015-07-20 DIAGNOSIS — Z86718 Personal history of other venous thrombosis and embolism: Secondary | ICD-10-CM | POA: Diagnosis not present

## 2015-07-20 DIAGNOSIS — I429 Cardiomyopathy, unspecified: Secondary | ICD-10-CM | POA: Diagnosis not present

## 2015-07-20 DIAGNOSIS — N959 Unspecified menopausal and perimenopausal disorder: Secondary | ICD-10-CM | POA: Diagnosis not present

## 2015-07-20 DIAGNOSIS — E859 Amyloidosis, unspecified: Secondary | ICD-10-CM | POA: Diagnosis not present

## 2015-07-20 DIAGNOSIS — E039 Hypothyroidism, unspecified: Secondary | ICD-10-CM | POA: Diagnosis not present

## 2015-07-20 DIAGNOSIS — I349 Nonrheumatic mitral valve disorder, unspecified: Secondary | ICD-10-CM | POA: Diagnosis not present

## 2015-07-20 DIAGNOSIS — K5190 Ulcerative colitis, unspecified, without complications: Secondary | ICD-10-CM | POA: Diagnosis not present

## 2015-07-20 DIAGNOSIS — J84115 Respiratory bronchiolitis interstitial lung disease: Secondary | ICD-10-CM | POA: Diagnosis not present

## 2015-07-20 DIAGNOSIS — H04129 Dry eye syndrome of unspecified lacrimal gland: Secondary | ICD-10-CM | POA: Diagnosis not present

## 2015-07-22 DIAGNOSIS — R627 Adult failure to thrive: Secondary | ICD-10-CM | POA: Diagnosis not present

## 2015-07-22 DIAGNOSIS — K5190 Ulcerative colitis, unspecified, without complications: Secondary | ICD-10-CM | POA: Diagnosis not present

## 2015-07-22 DIAGNOSIS — R918 Other nonspecific abnormal finding of lung field: Secondary | ICD-10-CM | POA: Diagnosis not present

## 2015-07-22 DIAGNOSIS — J84115 Respiratory bronchiolitis interstitial lung disease: Secondary | ICD-10-CM | POA: Diagnosis not present

## 2015-07-22 DIAGNOSIS — Z79899 Other long term (current) drug therapy: Secondary | ICD-10-CM | POA: Diagnosis not present

## 2015-07-22 DIAGNOSIS — Z7982 Long term (current) use of aspirin: Secondary | ICD-10-CM | POA: Diagnosis not present

## 2015-07-22 DIAGNOSIS — E859 Amyloidosis, unspecified: Secondary | ICD-10-CM | POA: Diagnosis not present

## 2015-07-22 DIAGNOSIS — E854 Organ-limited amyloidosis: Secondary | ICD-10-CM | POA: Diagnosis not present

## 2015-07-22 DIAGNOSIS — I349 Nonrheumatic mitral valve disorder, unspecified: Secondary | ICD-10-CM | POA: Diagnosis not present

## 2015-07-22 DIAGNOSIS — I5032 Chronic diastolic (congestive) heart failure: Secondary | ICD-10-CM | POA: Diagnosis not present

## 2015-07-22 DIAGNOSIS — I429 Cardiomyopathy, unspecified: Secondary | ICD-10-CM | POA: Diagnosis not present

## 2015-07-22 DIAGNOSIS — J90 Pleural effusion, not elsewhere classified: Secondary | ICD-10-CM | POA: Diagnosis not present

## 2015-07-22 DIAGNOSIS — N959 Unspecified menopausal and perimenopausal disorder: Secondary | ICD-10-CM | POA: Diagnosis not present

## 2015-07-22 DIAGNOSIS — Z7901 Long term (current) use of anticoagulants: Secondary | ICD-10-CM | POA: Diagnosis not present

## 2015-07-22 DIAGNOSIS — J8489 Other specified interstitial pulmonary diseases: Secondary | ICD-10-CM | POA: Diagnosis not present

## 2015-07-23 DIAGNOSIS — N959 Unspecified menopausal and perimenopausal disorder: Secondary | ICD-10-CM | POA: Diagnosis not present

## 2015-07-23 DIAGNOSIS — R0602 Shortness of breath: Secondary | ICD-10-CM | POA: Diagnosis not present

## 2015-07-23 DIAGNOSIS — I349 Nonrheumatic mitral valve disorder, unspecified: Secondary | ICD-10-CM | POA: Diagnosis not present

## 2015-07-23 DIAGNOSIS — E858 Other amyloidosis: Secondary | ICD-10-CM | POA: Diagnosis not present

## 2015-07-23 DIAGNOSIS — I5030 Unspecified diastolic (congestive) heart failure: Secondary | ICD-10-CM | POA: Diagnosis not present

## 2015-07-23 DIAGNOSIS — Z5181 Encounter for therapeutic drug level monitoring: Secondary | ICD-10-CM | POA: Diagnosis not present

## 2015-07-23 DIAGNOSIS — J84115 Respiratory bronchiolitis interstitial lung disease: Secondary | ICD-10-CM | POA: Diagnosis not present

## 2015-07-23 DIAGNOSIS — E859 Amyloidosis, unspecified: Secondary | ICD-10-CM | POA: Diagnosis not present

## 2015-07-23 DIAGNOSIS — I429 Cardiomyopathy, unspecified: Secondary | ICD-10-CM | POA: Diagnosis not present

## 2015-07-23 DIAGNOSIS — K5190 Ulcerative colitis, unspecified, without complications: Secondary | ICD-10-CM | POA: Diagnosis not present

## 2015-07-24 DIAGNOSIS — K5190 Ulcerative colitis, unspecified, without complications: Secondary | ICD-10-CM | POA: Diagnosis not present

## 2015-07-24 DIAGNOSIS — I349 Nonrheumatic mitral valve disorder, unspecified: Secondary | ICD-10-CM | POA: Diagnosis not present

## 2015-07-24 DIAGNOSIS — J84115 Respiratory bronchiolitis interstitial lung disease: Secondary | ICD-10-CM | POA: Diagnosis not present

## 2015-07-24 DIAGNOSIS — N959 Unspecified menopausal and perimenopausal disorder: Secondary | ICD-10-CM | POA: Diagnosis not present

## 2015-07-24 DIAGNOSIS — E859 Amyloidosis, unspecified: Secondary | ICD-10-CM | POA: Diagnosis not present

## 2015-07-24 DIAGNOSIS — I429 Cardiomyopathy, unspecified: Secondary | ICD-10-CM | POA: Diagnosis not present

## 2015-07-26 DIAGNOSIS — I429 Cardiomyopathy, unspecified: Secondary | ICD-10-CM | POA: Diagnosis not present

## 2015-07-26 DIAGNOSIS — I349 Nonrheumatic mitral valve disorder, unspecified: Secondary | ICD-10-CM | POA: Diagnosis not present

## 2015-07-26 DIAGNOSIS — N959 Unspecified menopausal and perimenopausal disorder: Secondary | ICD-10-CM | POA: Diagnosis not present

## 2015-07-26 DIAGNOSIS — J84115 Respiratory bronchiolitis interstitial lung disease: Secondary | ICD-10-CM | POA: Diagnosis not present

## 2015-07-26 DIAGNOSIS — K5190 Ulcerative colitis, unspecified, without complications: Secondary | ICD-10-CM | POA: Diagnosis not present

## 2015-07-26 DIAGNOSIS — E859 Amyloidosis, unspecified: Secondary | ICD-10-CM | POA: Diagnosis not present

## 2015-07-30 DIAGNOSIS — J84115 Respiratory bronchiolitis interstitial lung disease: Secondary | ICD-10-CM | POA: Diagnosis not present

## 2015-07-30 DIAGNOSIS — N959 Unspecified menopausal and perimenopausal disorder: Secondary | ICD-10-CM | POA: Diagnosis not present

## 2015-07-30 DIAGNOSIS — I429 Cardiomyopathy, unspecified: Secondary | ICD-10-CM | POA: Diagnosis not present

## 2015-07-30 DIAGNOSIS — I349 Nonrheumatic mitral valve disorder, unspecified: Secondary | ICD-10-CM | POA: Diagnosis not present

## 2015-07-30 DIAGNOSIS — E859 Amyloidosis, unspecified: Secondary | ICD-10-CM | POA: Diagnosis not present

## 2015-07-30 DIAGNOSIS — K5190 Ulcerative colitis, unspecified, without complications: Secondary | ICD-10-CM | POA: Diagnosis not present

## 2015-07-31 DIAGNOSIS — N959 Unspecified menopausal and perimenopausal disorder: Secondary | ICD-10-CM | POA: Diagnosis not present

## 2015-07-31 DIAGNOSIS — I429 Cardiomyopathy, unspecified: Secondary | ICD-10-CM | POA: Diagnosis not present

## 2015-07-31 DIAGNOSIS — J84115 Respiratory bronchiolitis interstitial lung disease: Secondary | ICD-10-CM | POA: Diagnosis not present

## 2015-07-31 DIAGNOSIS — E859 Amyloidosis, unspecified: Secondary | ICD-10-CM | POA: Diagnosis not present

## 2015-07-31 DIAGNOSIS — I349 Nonrheumatic mitral valve disorder, unspecified: Secondary | ICD-10-CM | POA: Diagnosis not present

## 2015-07-31 DIAGNOSIS — K5190 Ulcerative colitis, unspecified, without complications: Secondary | ICD-10-CM | POA: Diagnosis not present

## 2015-08-02 DIAGNOSIS — E859 Amyloidosis, unspecified: Secondary | ICD-10-CM | POA: Diagnosis not present

## 2015-08-02 DIAGNOSIS — N959 Unspecified menopausal and perimenopausal disorder: Secondary | ICD-10-CM | POA: Diagnosis not present

## 2015-08-02 DIAGNOSIS — I429 Cardiomyopathy, unspecified: Secondary | ICD-10-CM | POA: Diagnosis not present

## 2015-08-02 DIAGNOSIS — I349 Nonrheumatic mitral valve disorder, unspecified: Secondary | ICD-10-CM | POA: Diagnosis not present

## 2015-08-02 DIAGNOSIS — K5190 Ulcerative colitis, unspecified, without complications: Secondary | ICD-10-CM | POA: Diagnosis not present

## 2015-08-02 DIAGNOSIS — J84115 Respiratory bronchiolitis interstitial lung disease: Secondary | ICD-10-CM | POA: Diagnosis not present

## 2015-08-05 DIAGNOSIS — E859 Amyloidosis, unspecified: Secondary | ICD-10-CM | POA: Diagnosis not present

## 2015-08-05 DIAGNOSIS — J84115 Respiratory bronchiolitis interstitial lung disease: Secondary | ICD-10-CM | POA: Diagnosis not present

## 2015-08-05 DIAGNOSIS — N959 Unspecified menopausal and perimenopausal disorder: Secondary | ICD-10-CM | POA: Diagnosis not present

## 2015-08-05 DIAGNOSIS — I349 Nonrheumatic mitral valve disorder, unspecified: Secondary | ICD-10-CM | POA: Diagnosis not present

## 2015-08-05 DIAGNOSIS — K5190 Ulcerative colitis, unspecified, without complications: Secondary | ICD-10-CM | POA: Diagnosis not present

## 2015-08-05 DIAGNOSIS — I429 Cardiomyopathy, unspecified: Secondary | ICD-10-CM | POA: Diagnosis not present

## 2015-08-06 DIAGNOSIS — N959 Unspecified menopausal and perimenopausal disorder: Secondary | ICD-10-CM | POA: Diagnosis not present

## 2015-08-06 DIAGNOSIS — I429 Cardiomyopathy, unspecified: Secondary | ICD-10-CM | POA: Diagnosis not present

## 2015-08-06 DIAGNOSIS — K5190 Ulcerative colitis, unspecified, without complications: Secondary | ICD-10-CM | POA: Diagnosis not present

## 2015-08-06 DIAGNOSIS — E859 Amyloidosis, unspecified: Secondary | ICD-10-CM | POA: Diagnosis not present

## 2015-08-06 DIAGNOSIS — I349 Nonrheumatic mitral valve disorder, unspecified: Secondary | ICD-10-CM | POA: Diagnosis not present

## 2015-08-06 DIAGNOSIS — J84115 Respiratory bronchiolitis interstitial lung disease: Secondary | ICD-10-CM | POA: Diagnosis not present

## 2015-08-07 DIAGNOSIS — I429 Cardiomyopathy, unspecified: Secondary | ICD-10-CM | POA: Diagnosis not present

## 2015-08-07 DIAGNOSIS — E859 Amyloidosis, unspecified: Secondary | ICD-10-CM | POA: Diagnosis not present

## 2015-08-07 DIAGNOSIS — K5190 Ulcerative colitis, unspecified, without complications: Secondary | ICD-10-CM | POA: Diagnosis not present

## 2015-08-07 DIAGNOSIS — J84115 Respiratory bronchiolitis interstitial lung disease: Secondary | ICD-10-CM | POA: Diagnosis not present

## 2015-08-07 DIAGNOSIS — I349 Nonrheumatic mitral valve disorder, unspecified: Secondary | ICD-10-CM | POA: Diagnosis not present

## 2015-08-07 DIAGNOSIS — N959 Unspecified menopausal and perimenopausal disorder: Secondary | ICD-10-CM | POA: Diagnosis not present

## 2015-08-09 DIAGNOSIS — I349 Nonrheumatic mitral valve disorder, unspecified: Secondary | ICD-10-CM | POA: Diagnosis not present

## 2015-08-09 DIAGNOSIS — E859 Amyloidosis, unspecified: Secondary | ICD-10-CM | POA: Diagnosis not present

## 2015-08-09 DIAGNOSIS — J84115 Respiratory bronchiolitis interstitial lung disease: Secondary | ICD-10-CM | POA: Diagnosis not present

## 2015-08-09 DIAGNOSIS — I429 Cardiomyopathy, unspecified: Secondary | ICD-10-CM | POA: Diagnosis not present

## 2015-08-09 DIAGNOSIS — K5190 Ulcerative colitis, unspecified, without complications: Secondary | ICD-10-CM | POA: Diagnosis not present

## 2015-08-09 DIAGNOSIS — N959 Unspecified menopausal and perimenopausal disorder: Secondary | ICD-10-CM | POA: Diagnosis not present

## 2015-08-13 DIAGNOSIS — E859 Amyloidosis, unspecified: Secondary | ICD-10-CM | POA: Diagnosis not present

## 2015-08-13 DIAGNOSIS — N959 Unspecified menopausal and perimenopausal disorder: Secondary | ICD-10-CM | POA: Diagnosis not present

## 2015-08-13 DIAGNOSIS — J84115 Respiratory bronchiolitis interstitial lung disease: Secondary | ICD-10-CM | POA: Diagnosis not present

## 2015-08-13 DIAGNOSIS — I349 Nonrheumatic mitral valve disorder, unspecified: Secondary | ICD-10-CM | POA: Diagnosis not present

## 2015-08-13 DIAGNOSIS — I429 Cardiomyopathy, unspecified: Secondary | ICD-10-CM | POA: Diagnosis not present

## 2015-08-13 DIAGNOSIS — K5190 Ulcerative colitis, unspecified, without complications: Secondary | ICD-10-CM | POA: Diagnosis not present

## 2015-08-14 DIAGNOSIS — J84115 Respiratory bronchiolitis interstitial lung disease: Secondary | ICD-10-CM | POA: Diagnosis not present

## 2015-08-14 DIAGNOSIS — K5190 Ulcerative colitis, unspecified, without complications: Secondary | ICD-10-CM | POA: Diagnosis not present

## 2015-08-14 DIAGNOSIS — N959 Unspecified menopausal and perimenopausal disorder: Secondary | ICD-10-CM | POA: Diagnosis not present

## 2015-08-14 DIAGNOSIS — E859 Amyloidosis, unspecified: Secondary | ICD-10-CM | POA: Diagnosis not present

## 2015-08-14 DIAGNOSIS — I429 Cardiomyopathy, unspecified: Secondary | ICD-10-CM | POA: Diagnosis not present

## 2015-08-14 DIAGNOSIS — I349 Nonrheumatic mitral valve disorder, unspecified: Secondary | ICD-10-CM | POA: Diagnosis not present

## 2015-08-15 DIAGNOSIS — K5190 Ulcerative colitis, unspecified, without complications: Secondary | ICD-10-CM | POA: Diagnosis not present

## 2015-08-15 DIAGNOSIS — N959 Unspecified menopausal and perimenopausal disorder: Secondary | ICD-10-CM | POA: Diagnosis not present

## 2015-08-15 DIAGNOSIS — E859 Amyloidosis, unspecified: Secondary | ICD-10-CM | POA: Diagnosis not present

## 2015-08-15 DIAGNOSIS — I429 Cardiomyopathy, unspecified: Secondary | ICD-10-CM | POA: Diagnosis not present

## 2015-08-15 DIAGNOSIS — I349 Nonrheumatic mitral valve disorder, unspecified: Secondary | ICD-10-CM | POA: Diagnosis not present

## 2015-08-15 DIAGNOSIS — J84115 Respiratory bronchiolitis interstitial lung disease: Secondary | ICD-10-CM | POA: Diagnosis not present

## 2015-08-19 DIAGNOSIS — H04129 Dry eye syndrome of unspecified lacrimal gland: Secondary | ICD-10-CM | POA: Diagnosis not present

## 2015-08-19 DIAGNOSIS — K5190 Ulcerative colitis, unspecified, without complications: Secondary | ICD-10-CM | POA: Diagnosis not present

## 2015-08-19 DIAGNOSIS — N959 Unspecified menopausal and perimenopausal disorder: Secondary | ICD-10-CM | POA: Diagnosis not present

## 2015-08-19 DIAGNOSIS — E859 Amyloidosis, unspecified: Secondary | ICD-10-CM | POA: Diagnosis not present

## 2015-08-19 DIAGNOSIS — J84115 Respiratory bronchiolitis interstitial lung disease: Secondary | ICD-10-CM | POA: Diagnosis not present

## 2015-08-19 DIAGNOSIS — I429 Cardiomyopathy, unspecified: Secondary | ICD-10-CM | POA: Diagnosis not present

## 2015-08-19 DIAGNOSIS — Z86718 Personal history of other venous thrombosis and embolism: Secondary | ICD-10-CM | POA: Diagnosis not present

## 2015-08-19 DIAGNOSIS — E039 Hypothyroidism, unspecified: Secondary | ICD-10-CM | POA: Diagnosis not present

## 2015-08-19 DIAGNOSIS — I349 Nonrheumatic mitral valve disorder, unspecified: Secondary | ICD-10-CM | POA: Diagnosis not present

## 2015-08-21 DIAGNOSIS — I349 Nonrheumatic mitral valve disorder, unspecified: Secondary | ICD-10-CM | POA: Diagnosis not present

## 2015-08-21 DIAGNOSIS — J84115 Respiratory bronchiolitis interstitial lung disease: Secondary | ICD-10-CM | POA: Diagnosis not present

## 2015-08-21 DIAGNOSIS — I429 Cardiomyopathy, unspecified: Secondary | ICD-10-CM | POA: Diagnosis not present

## 2015-08-21 DIAGNOSIS — N959 Unspecified menopausal and perimenopausal disorder: Secondary | ICD-10-CM | POA: Diagnosis not present

## 2015-08-21 DIAGNOSIS — E859 Amyloidosis, unspecified: Secondary | ICD-10-CM | POA: Diagnosis not present

## 2015-08-21 DIAGNOSIS — K5190 Ulcerative colitis, unspecified, without complications: Secondary | ICD-10-CM | POA: Diagnosis not present

## 2015-08-22 DIAGNOSIS — J84115 Respiratory bronchiolitis interstitial lung disease: Secondary | ICD-10-CM | POA: Diagnosis not present

## 2015-08-22 DIAGNOSIS — K5190 Ulcerative colitis, unspecified, without complications: Secondary | ICD-10-CM | POA: Diagnosis not present

## 2015-08-22 DIAGNOSIS — I349 Nonrheumatic mitral valve disorder, unspecified: Secondary | ICD-10-CM | POA: Diagnosis not present

## 2015-08-22 DIAGNOSIS — I429 Cardiomyopathy, unspecified: Secondary | ICD-10-CM | POA: Diagnosis not present

## 2015-08-22 DIAGNOSIS — E859 Amyloidosis, unspecified: Secondary | ICD-10-CM | POA: Diagnosis not present

## 2015-08-22 DIAGNOSIS — N959 Unspecified menopausal and perimenopausal disorder: Secondary | ICD-10-CM | POA: Diagnosis not present

## 2015-08-23 DIAGNOSIS — J84115 Respiratory bronchiolitis interstitial lung disease: Secondary | ICD-10-CM | POA: Diagnosis not present

## 2015-08-23 DIAGNOSIS — N959 Unspecified menopausal and perimenopausal disorder: Secondary | ICD-10-CM | POA: Diagnosis not present

## 2015-08-23 DIAGNOSIS — E859 Amyloidosis, unspecified: Secondary | ICD-10-CM | POA: Diagnosis not present

## 2015-08-23 DIAGNOSIS — I349 Nonrheumatic mitral valve disorder, unspecified: Secondary | ICD-10-CM | POA: Diagnosis not present

## 2015-08-23 DIAGNOSIS — I429 Cardiomyopathy, unspecified: Secondary | ICD-10-CM | POA: Diagnosis not present

## 2015-08-23 DIAGNOSIS — K5190 Ulcerative colitis, unspecified, without complications: Secondary | ICD-10-CM | POA: Diagnosis not present

## 2015-08-27 DIAGNOSIS — K5190 Ulcerative colitis, unspecified, without complications: Secondary | ICD-10-CM | POA: Diagnosis not present

## 2015-08-27 DIAGNOSIS — N959 Unspecified menopausal and perimenopausal disorder: Secondary | ICD-10-CM | POA: Diagnosis not present

## 2015-08-27 DIAGNOSIS — I429 Cardiomyopathy, unspecified: Secondary | ICD-10-CM | POA: Diagnosis not present

## 2015-08-27 DIAGNOSIS — I349 Nonrheumatic mitral valve disorder, unspecified: Secondary | ICD-10-CM | POA: Diagnosis not present

## 2015-08-27 DIAGNOSIS — J84115 Respiratory bronchiolitis interstitial lung disease: Secondary | ICD-10-CM | POA: Diagnosis not present

## 2015-08-27 DIAGNOSIS — E859 Amyloidosis, unspecified: Secondary | ICD-10-CM | POA: Diagnosis not present

## 2015-08-28 DIAGNOSIS — I349 Nonrheumatic mitral valve disorder, unspecified: Secondary | ICD-10-CM | POA: Diagnosis not present

## 2015-08-28 DIAGNOSIS — I429 Cardiomyopathy, unspecified: Secondary | ICD-10-CM | POA: Diagnosis not present

## 2015-08-28 DIAGNOSIS — J84115 Respiratory bronchiolitis interstitial lung disease: Secondary | ICD-10-CM | POA: Diagnosis not present

## 2015-08-28 DIAGNOSIS — E859 Amyloidosis, unspecified: Secondary | ICD-10-CM | POA: Diagnosis not present

## 2015-08-28 DIAGNOSIS — K5190 Ulcerative colitis, unspecified, without complications: Secondary | ICD-10-CM | POA: Diagnosis not present

## 2015-08-28 DIAGNOSIS — N959 Unspecified menopausal and perimenopausal disorder: Secondary | ICD-10-CM | POA: Diagnosis not present

## 2015-08-29 DIAGNOSIS — E859 Amyloidosis, unspecified: Secondary | ICD-10-CM | POA: Diagnosis not present

## 2015-08-29 DIAGNOSIS — K5190 Ulcerative colitis, unspecified, without complications: Secondary | ICD-10-CM | POA: Diagnosis not present

## 2015-08-29 DIAGNOSIS — J84115 Respiratory bronchiolitis interstitial lung disease: Secondary | ICD-10-CM | POA: Diagnosis not present

## 2015-08-29 DIAGNOSIS — N959 Unspecified menopausal and perimenopausal disorder: Secondary | ICD-10-CM | POA: Diagnosis not present

## 2015-08-29 DIAGNOSIS — I429 Cardiomyopathy, unspecified: Secondary | ICD-10-CM | POA: Diagnosis not present

## 2015-08-29 DIAGNOSIS — I349 Nonrheumatic mitral valve disorder, unspecified: Secondary | ICD-10-CM | POA: Diagnosis not present

## 2015-08-30 DIAGNOSIS — N959 Unspecified menopausal and perimenopausal disorder: Secondary | ICD-10-CM | POA: Diagnosis not present

## 2015-08-30 DIAGNOSIS — I429 Cardiomyopathy, unspecified: Secondary | ICD-10-CM | POA: Diagnosis not present

## 2015-08-30 DIAGNOSIS — K5190 Ulcerative colitis, unspecified, without complications: Secondary | ICD-10-CM | POA: Diagnosis not present

## 2015-08-30 DIAGNOSIS — I349 Nonrheumatic mitral valve disorder, unspecified: Secondary | ICD-10-CM | POA: Diagnosis not present

## 2015-08-30 DIAGNOSIS — J84115 Respiratory bronchiolitis interstitial lung disease: Secondary | ICD-10-CM | POA: Diagnosis not present

## 2015-08-30 DIAGNOSIS — E859 Amyloidosis, unspecified: Secondary | ICD-10-CM | POA: Diagnosis not present

## 2015-08-31 DIAGNOSIS — N959 Unspecified menopausal and perimenopausal disorder: Secondary | ICD-10-CM | POA: Diagnosis not present

## 2015-08-31 DIAGNOSIS — I429 Cardiomyopathy, unspecified: Secondary | ICD-10-CM | POA: Diagnosis not present

## 2015-08-31 DIAGNOSIS — I349 Nonrheumatic mitral valve disorder, unspecified: Secondary | ICD-10-CM | POA: Diagnosis not present

## 2015-08-31 DIAGNOSIS — J84115 Respiratory bronchiolitis interstitial lung disease: Secondary | ICD-10-CM | POA: Diagnosis not present

## 2015-08-31 DIAGNOSIS — K5190 Ulcerative colitis, unspecified, without complications: Secondary | ICD-10-CM | POA: Diagnosis not present

## 2015-08-31 DIAGNOSIS — E859 Amyloidosis, unspecified: Secondary | ICD-10-CM | POA: Diagnosis not present

## 2015-09-02 DIAGNOSIS — J84115 Respiratory bronchiolitis interstitial lung disease: Secondary | ICD-10-CM | POA: Diagnosis not present

## 2015-09-02 DIAGNOSIS — E859 Amyloidosis, unspecified: Secondary | ICD-10-CM | POA: Diagnosis not present

## 2015-09-02 DIAGNOSIS — N959 Unspecified menopausal and perimenopausal disorder: Secondary | ICD-10-CM | POA: Diagnosis not present

## 2015-09-02 DIAGNOSIS — I349 Nonrheumatic mitral valve disorder, unspecified: Secondary | ICD-10-CM | POA: Diagnosis not present

## 2015-09-02 DIAGNOSIS — K5190 Ulcerative colitis, unspecified, without complications: Secondary | ICD-10-CM | POA: Diagnosis not present

## 2015-09-02 DIAGNOSIS — I429 Cardiomyopathy, unspecified: Secondary | ICD-10-CM | POA: Diagnosis not present

## 2015-09-03 DIAGNOSIS — K5190 Ulcerative colitis, unspecified, without complications: Secondary | ICD-10-CM | POA: Diagnosis not present

## 2015-09-03 DIAGNOSIS — I349 Nonrheumatic mitral valve disorder, unspecified: Secondary | ICD-10-CM | POA: Diagnosis not present

## 2015-09-03 DIAGNOSIS — I429 Cardiomyopathy, unspecified: Secondary | ICD-10-CM | POA: Diagnosis not present

## 2015-09-03 DIAGNOSIS — N959 Unspecified menopausal and perimenopausal disorder: Secondary | ICD-10-CM | POA: Diagnosis not present

## 2015-09-03 DIAGNOSIS — E859 Amyloidosis, unspecified: Secondary | ICD-10-CM | POA: Diagnosis not present

## 2015-09-03 DIAGNOSIS — J84115 Respiratory bronchiolitis interstitial lung disease: Secondary | ICD-10-CM | POA: Diagnosis not present

## 2015-09-03 DIAGNOSIS — N952 Postmenopausal atrophic vaginitis: Secondary | ICD-10-CM | POA: Diagnosis not present

## 2015-09-04 DIAGNOSIS — N959 Unspecified menopausal and perimenopausal disorder: Secondary | ICD-10-CM | POA: Diagnosis not present

## 2015-09-04 DIAGNOSIS — I349 Nonrheumatic mitral valve disorder, unspecified: Secondary | ICD-10-CM | POA: Diagnosis not present

## 2015-09-04 DIAGNOSIS — E859 Amyloidosis, unspecified: Secondary | ICD-10-CM | POA: Diagnosis not present

## 2015-09-04 DIAGNOSIS — K5190 Ulcerative colitis, unspecified, without complications: Secondary | ICD-10-CM | POA: Diagnosis not present

## 2015-09-04 DIAGNOSIS — J84115 Respiratory bronchiolitis interstitial lung disease: Secondary | ICD-10-CM | POA: Diagnosis not present

## 2015-09-04 DIAGNOSIS — I429 Cardiomyopathy, unspecified: Secondary | ICD-10-CM | POA: Diagnosis not present

## 2015-09-05 DIAGNOSIS — N959 Unspecified menopausal and perimenopausal disorder: Secondary | ICD-10-CM | POA: Diagnosis not present

## 2015-09-05 DIAGNOSIS — I349 Nonrheumatic mitral valve disorder, unspecified: Secondary | ICD-10-CM | POA: Diagnosis not present

## 2015-09-05 DIAGNOSIS — I429 Cardiomyopathy, unspecified: Secondary | ICD-10-CM | POA: Diagnosis not present

## 2015-09-05 DIAGNOSIS — E859 Amyloidosis, unspecified: Secondary | ICD-10-CM | POA: Diagnosis not present

## 2015-09-05 DIAGNOSIS — J84115 Respiratory bronchiolitis interstitial lung disease: Secondary | ICD-10-CM | POA: Diagnosis not present

## 2015-09-05 DIAGNOSIS — K5190 Ulcerative colitis, unspecified, without complications: Secondary | ICD-10-CM | POA: Diagnosis not present

## 2015-09-06 DIAGNOSIS — J84115 Respiratory bronchiolitis interstitial lung disease: Secondary | ICD-10-CM | POA: Diagnosis not present

## 2015-09-06 DIAGNOSIS — E859 Amyloidosis, unspecified: Secondary | ICD-10-CM | POA: Diagnosis not present

## 2015-09-06 DIAGNOSIS — I429 Cardiomyopathy, unspecified: Secondary | ICD-10-CM | POA: Diagnosis not present

## 2015-09-06 DIAGNOSIS — K5190 Ulcerative colitis, unspecified, without complications: Secondary | ICD-10-CM | POA: Diagnosis not present

## 2015-09-06 DIAGNOSIS — N959 Unspecified menopausal and perimenopausal disorder: Secondary | ICD-10-CM | POA: Diagnosis not present

## 2015-09-06 DIAGNOSIS — I349 Nonrheumatic mitral valve disorder, unspecified: Secondary | ICD-10-CM | POA: Diagnosis not present

## 2015-09-10 DIAGNOSIS — N959 Unspecified menopausal and perimenopausal disorder: Secondary | ICD-10-CM | POA: Diagnosis not present

## 2015-09-10 DIAGNOSIS — K5190 Ulcerative colitis, unspecified, without complications: Secondary | ICD-10-CM | POA: Diagnosis not present

## 2015-09-10 DIAGNOSIS — J84115 Respiratory bronchiolitis interstitial lung disease: Secondary | ICD-10-CM | POA: Diagnosis not present

## 2015-09-10 DIAGNOSIS — E859 Amyloidosis, unspecified: Secondary | ICD-10-CM | POA: Diagnosis not present

## 2015-09-10 DIAGNOSIS — I349 Nonrheumatic mitral valve disorder, unspecified: Secondary | ICD-10-CM | POA: Diagnosis not present

## 2015-09-10 DIAGNOSIS — I429 Cardiomyopathy, unspecified: Secondary | ICD-10-CM | POA: Diagnosis not present

## 2015-09-11 DIAGNOSIS — E859 Amyloidosis, unspecified: Secondary | ICD-10-CM | POA: Diagnosis not present

## 2015-09-11 DIAGNOSIS — I10 Essential (primary) hypertension: Secondary | ICD-10-CM | POA: Diagnosis not present

## 2015-09-11 DIAGNOSIS — I509 Heart failure, unspecified: Secondary | ICD-10-CM | POA: Diagnosis not present

## 2015-09-11 DIAGNOSIS — I349 Nonrheumatic mitral valve disorder, unspecified: Secondary | ICD-10-CM | POA: Diagnosis not present

## 2015-09-11 DIAGNOSIS — I429 Cardiomyopathy, unspecified: Secondary | ICD-10-CM | POA: Diagnosis not present

## 2015-09-11 DIAGNOSIS — N959 Unspecified menopausal and perimenopausal disorder: Secondary | ICD-10-CM | POA: Diagnosis not present

## 2015-09-11 DIAGNOSIS — E854 Organ-limited amyloidosis: Secondary | ICD-10-CM | POA: Diagnosis not present

## 2015-09-11 DIAGNOSIS — J84115 Respiratory bronchiolitis interstitial lung disease: Secondary | ICD-10-CM | POA: Diagnosis not present

## 2015-09-11 DIAGNOSIS — K5190 Ulcerative colitis, unspecified, without complications: Secondary | ICD-10-CM | POA: Diagnosis not present

## 2015-09-11 DIAGNOSIS — I482 Chronic atrial fibrillation: Secondary | ICD-10-CM | POA: Diagnosis not present

## 2015-09-12 DIAGNOSIS — I349 Nonrheumatic mitral valve disorder, unspecified: Secondary | ICD-10-CM | POA: Diagnosis not present

## 2015-09-12 DIAGNOSIS — E859 Amyloidosis, unspecified: Secondary | ICD-10-CM | POA: Diagnosis not present

## 2015-09-12 DIAGNOSIS — N959 Unspecified menopausal and perimenopausal disorder: Secondary | ICD-10-CM | POA: Diagnosis not present

## 2015-09-12 DIAGNOSIS — I429 Cardiomyopathy, unspecified: Secondary | ICD-10-CM | POA: Diagnosis not present

## 2015-09-12 DIAGNOSIS — K5190 Ulcerative colitis, unspecified, without complications: Secondary | ICD-10-CM | POA: Diagnosis not present

## 2015-09-12 DIAGNOSIS — J84115 Respiratory bronchiolitis interstitial lung disease: Secondary | ICD-10-CM | POA: Diagnosis not present

## 2015-09-13 DIAGNOSIS — N959 Unspecified menopausal and perimenopausal disorder: Secondary | ICD-10-CM | POA: Diagnosis not present

## 2015-09-13 DIAGNOSIS — I349 Nonrheumatic mitral valve disorder, unspecified: Secondary | ICD-10-CM | POA: Diagnosis not present

## 2015-09-13 DIAGNOSIS — I429 Cardiomyopathy, unspecified: Secondary | ICD-10-CM | POA: Diagnosis not present

## 2015-09-13 DIAGNOSIS — J84115 Respiratory bronchiolitis interstitial lung disease: Secondary | ICD-10-CM | POA: Diagnosis not present

## 2015-09-13 DIAGNOSIS — E859 Amyloidosis, unspecified: Secondary | ICD-10-CM | POA: Diagnosis not present

## 2015-09-13 DIAGNOSIS — K5190 Ulcerative colitis, unspecified, without complications: Secondary | ICD-10-CM | POA: Diagnosis not present

## 2015-09-16 DIAGNOSIS — J84115 Respiratory bronchiolitis interstitial lung disease: Secondary | ICD-10-CM | POA: Diagnosis not present

## 2015-09-16 DIAGNOSIS — I429 Cardiomyopathy, unspecified: Secondary | ICD-10-CM | POA: Diagnosis not present

## 2015-09-16 DIAGNOSIS — E859 Amyloidosis, unspecified: Secondary | ICD-10-CM | POA: Diagnosis not present

## 2015-09-16 DIAGNOSIS — I349 Nonrheumatic mitral valve disorder, unspecified: Secondary | ICD-10-CM | POA: Diagnosis not present

## 2015-09-16 DIAGNOSIS — N959 Unspecified menopausal and perimenopausal disorder: Secondary | ICD-10-CM | POA: Diagnosis not present

## 2015-09-16 DIAGNOSIS — K5190 Ulcerative colitis, unspecified, without complications: Secondary | ICD-10-CM | POA: Diagnosis not present

## 2015-09-17 DIAGNOSIS — J90 Pleural effusion, not elsewhere classified: Secondary | ICD-10-CM | POA: Diagnosis not present

## 2015-09-17 DIAGNOSIS — E859 Amyloidosis, unspecified: Secondary | ICD-10-CM | POA: Diagnosis not present

## 2015-09-17 DIAGNOSIS — N959 Unspecified menopausal and perimenopausal disorder: Secondary | ICD-10-CM | POA: Diagnosis not present

## 2015-09-17 DIAGNOSIS — K5190 Ulcerative colitis, unspecified, without complications: Secondary | ICD-10-CM | POA: Diagnosis not present

## 2015-09-17 DIAGNOSIS — J84115 Respiratory bronchiolitis interstitial lung disease: Secondary | ICD-10-CM | POA: Diagnosis not present

## 2015-09-17 DIAGNOSIS — I349 Nonrheumatic mitral valve disorder, unspecified: Secondary | ICD-10-CM | POA: Diagnosis not present

## 2015-09-17 DIAGNOSIS — I429 Cardiomyopathy, unspecified: Secondary | ICD-10-CM | POA: Diagnosis not present

## 2015-09-18 DIAGNOSIS — E859 Amyloidosis, unspecified: Secondary | ICD-10-CM | POA: Diagnosis not present

## 2015-09-18 DIAGNOSIS — J84115 Respiratory bronchiolitis interstitial lung disease: Secondary | ICD-10-CM | POA: Diagnosis not present

## 2015-09-18 DIAGNOSIS — N959 Unspecified menopausal and perimenopausal disorder: Secondary | ICD-10-CM | POA: Diagnosis not present

## 2015-09-18 DIAGNOSIS — I349 Nonrheumatic mitral valve disorder, unspecified: Secondary | ICD-10-CM | POA: Diagnosis not present

## 2015-09-18 DIAGNOSIS — K5190 Ulcerative colitis, unspecified, without complications: Secondary | ICD-10-CM | POA: Diagnosis not present

## 2015-09-18 DIAGNOSIS — I429 Cardiomyopathy, unspecified: Secondary | ICD-10-CM | POA: Diagnosis not present

## 2015-09-19 DIAGNOSIS — Z86718 Personal history of other venous thrombosis and embolism: Secondary | ICD-10-CM | POA: Diagnosis not present

## 2015-09-19 DIAGNOSIS — E859 Amyloidosis, unspecified: Secondary | ICD-10-CM | POA: Diagnosis not present

## 2015-09-19 DIAGNOSIS — I349 Nonrheumatic mitral valve disorder, unspecified: Secondary | ICD-10-CM | POA: Diagnosis not present

## 2015-09-19 DIAGNOSIS — E039 Hypothyroidism, unspecified: Secondary | ICD-10-CM | POA: Diagnosis not present

## 2015-09-19 DIAGNOSIS — N959 Unspecified menopausal and perimenopausal disorder: Secondary | ICD-10-CM | POA: Diagnosis not present

## 2015-09-19 DIAGNOSIS — I429 Cardiomyopathy, unspecified: Secondary | ICD-10-CM | POA: Diagnosis not present

## 2015-09-19 DIAGNOSIS — H04129 Dry eye syndrome of unspecified lacrimal gland: Secondary | ICD-10-CM | POA: Diagnosis not present

## 2015-09-19 DIAGNOSIS — K5190 Ulcerative colitis, unspecified, without complications: Secondary | ICD-10-CM | POA: Diagnosis not present

## 2015-09-19 DIAGNOSIS — J84115 Respiratory bronchiolitis interstitial lung disease: Secondary | ICD-10-CM | POA: Diagnosis not present

## 2015-09-20 DIAGNOSIS — I349 Nonrheumatic mitral valve disorder, unspecified: Secondary | ICD-10-CM | POA: Diagnosis not present

## 2015-09-20 DIAGNOSIS — J84115 Respiratory bronchiolitis interstitial lung disease: Secondary | ICD-10-CM | POA: Diagnosis not present

## 2015-09-20 DIAGNOSIS — K5190 Ulcerative colitis, unspecified, without complications: Secondary | ICD-10-CM | POA: Diagnosis not present

## 2015-09-20 DIAGNOSIS — E859 Amyloidosis, unspecified: Secondary | ICD-10-CM | POA: Diagnosis not present

## 2015-09-20 DIAGNOSIS — N959 Unspecified menopausal and perimenopausal disorder: Secondary | ICD-10-CM | POA: Diagnosis not present

## 2015-09-20 DIAGNOSIS — I429 Cardiomyopathy, unspecified: Secondary | ICD-10-CM | POA: Diagnosis not present

## 2015-09-24 DIAGNOSIS — N959 Unspecified menopausal and perimenopausal disorder: Secondary | ICD-10-CM | POA: Diagnosis not present

## 2015-09-24 DIAGNOSIS — I349 Nonrheumatic mitral valve disorder, unspecified: Secondary | ICD-10-CM | POA: Diagnosis not present

## 2015-09-24 DIAGNOSIS — J84115 Respiratory bronchiolitis interstitial lung disease: Secondary | ICD-10-CM | POA: Diagnosis not present

## 2015-09-24 DIAGNOSIS — I429 Cardiomyopathy, unspecified: Secondary | ICD-10-CM | POA: Diagnosis not present

## 2015-09-24 DIAGNOSIS — E859 Amyloidosis, unspecified: Secondary | ICD-10-CM | POA: Diagnosis not present

## 2015-09-24 DIAGNOSIS — K5190 Ulcerative colitis, unspecified, without complications: Secondary | ICD-10-CM | POA: Diagnosis not present

## 2015-09-25 DIAGNOSIS — J84115 Respiratory bronchiolitis interstitial lung disease: Secondary | ICD-10-CM | POA: Diagnosis not present

## 2015-09-25 DIAGNOSIS — I349 Nonrheumatic mitral valve disorder, unspecified: Secondary | ICD-10-CM | POA: Diagnosis not present

## 2015-09-25 DIAGNOSIS — N959 Unspecified menopausal and perimenopausal disorder: Secondary | ICD-10-CM | POA: Diagnosis not present

## 2015-09-25 DIAGNOSIS — K5190 Ulcerative colitis, unspecified, without complications: Secondary | ICD-10-CM | POA: Diagnosis not present

## 2015-09-25 DIAGNOSIS — I429 Cardiomyopathy, unspecified: Secondary | ICD-10-CM | POA: Diagnosis not present

## 2015-09-25 DIAGNOSIS — E859 Amyloidosis, unspecified: Secondary | ICD-10-CM | POA: Diagnosis not present

## 2015-09-26 DIAGNOSIS — I429 Cardiomyopathy, unspecified: Secondary | ICD-10-CM | POA: Diagnosis not present

## 2015-09-26 DIAGNOSIS — N959 Unspecified menopausal and perimenopausal disorder: Secondary | ICD-10-CM | POA: Diagnosis not present

## 2015-09-26 DIAGNOSIS — K5190 Ulcerative colitis, unspecified, without complications: Secondary | ICD-10-CM | POA: Diagnosis not present

## 2015-09-26 DIAGNOSIS — E859 Amyloidosis, unspecified: Secondary | ICD-10-CM | POA: Diagnosis not present

## 2015-09-26 DIAGNOSIS — I349 Nonrheumatic mitral valve disorder, unspecified: Secondary | ICD-10-CM | POA: Diagnosis not present

## 2015-09-26 DIAGNOSIS — J84115 Respiratory bronchiolitis interstitial lung disease: Secondary | ICD-10-CM | POA: Diagnosis not present

## 2015-09-27 DIAGNOSIS — N959 Unspecified menopausal and perimenopausal disorder: Secondary | ICD-10-CM | POA: Diagnosis not present

## 2015-09-27 DIAGNOSIS — I429 Cardiomyopathy, unspecified: Secondary | ICD-10-CM | POA: Diagnosis not present

## 2015-09-27 DIAGNOSIS — K5190 Ulcerative colitis, unspecified, without complications: Secondary | ICD-10-CM | POA: Diagnosis not present

## 2015-09-27 DIAGNOSIS — I349 Nonrheumatic mitral valve disorder, unspecified: Secondary | ICD-10-CM | POA: Diagnosis not present

## 2015-09-27 DIAGNOSIS — E859 Amyloidosis, unspecified: Secondary | ICD-10-CM | POA: Diagnosis not present

## 2015-09-27 DIAGNOSIS — J84115 Respiratory bronchiolitis interstitial lung disease: Secondary | ICD-10-CM | POA: Diagnosis not present

## 2015-09-30 DIAGNOSIS — K5190 Ulcerative colitis, unspecified, without complications: Secondary | ICD-10-CM | POA: Diagnosis not present

## 2015-09-30 DIAGNOSIS — I429 Cardiomyopathy, unspecified: Secondary | ICD-10-CM | POA: Diagnosis not present

## 2015-09-30 DIAGNOSIS — E859 Amyloidosis, unspecified: Secondary | ICD-10-CM | POA: Diagnosis not present

## 2015-09-30 DIAGNOSIS — J84115 Respiratory bronchiolitis interstitial lung disease: Secondary | ICD-10-CM | POA: Diagnosis not present

## 2015-09-30 DIAGNOSIS — I349 Nonrheumatic mitral valve disorder, unspecified: Secondary | ICD-10-CM | POA: Diagnosis not present

## 2015-09-30 DIAGNOSIS — N959 Unspecified menopausal and perimenopausal disorder: Secondary | ICD-10-CM | POA: Diagnosis not present

## 2015-10-01 DIAGNOSIS — I349 Nonrheumatic mitral valve disorder, unspecified: Secondary | ICD-10-CM | POA: Diagnosis not present

## 2015-10-01 DIAGNOSIS — J84115 Respiratory bronchiolitis interstitial lung disease: Secondary | ICD-10-CM | POA: Diagnosis not present

## 2015-10-01 DIAGNOSIS — N959 Unspecified menopausal and perimenopausal disorder: Secondary | ICD-10-CM | POA: Diagnosis not present

## 2015-10-01 DIAGNOSIS — K5190 Ulcerative colitis, unspecified, without complications: Secondary | ICD-10-CM | POA: Diagnosis not present

## 2015-10-01 DIAGNOSIS — E859 Amyloidosis, unspecified: Secondary | ICD-10-CM | POA: Diagnosis not present

## 2015-10-01 DIAGNOSIS — I429 Cardiomyopathy, unspecified: Secondary | ICD-10-CM | POA: Diagnosis not present

## 2015-10-02 DIAGNOSIS — I349 Nonrheumatic mitral valve disorder, unspecified: Secondary | ICD-10-CM | POA: Diagnosis not present

## 2015-10-02 DIAGNOSIS — E859 Amyloidosis, unspecified: Secondary | ICD-10-CM | POA: Diagnosis not present

## 2015-10-02 DIAGNOSIS — N959 Unspecified menopausal and perimenopausal disorder: Secondary | ICD-10-CM | POA: Diagnosis not present

## 2015-10-02 DIAGNOSIS — J84115 Respiratory bronchiolitis interstitial lung disease: Secondary | ICD-10-CM | POA: Diagnosis not present

## 2015-10-02 DIAGNOSIS — I429 Cardiomyopathy, unspecified: Secondary | ICD-10-CM | POA: Diagnosis not present

## 2015-10-02 DIAGNOSIS — K5190 Ulcerative colitis, unspecified, without complications: Secondary | ICD-10-CM | POA: Diagnosis not present

## 2015-10-03 DIAGNOSIS — I349 Nonrheumatic mitral valve disorder, unspecified: Secondary | ICD-10-CM | POA: Diagnosis not present

## 2015-10-03 DIAGNOSIS — N959 Unspecified menopausal and perimenopausal disorder: Secondary | ICD-10-CM | POA: Diagnosis not present

## 2015-10-03 DIAGNOSIS — E859 Amyloidosis, unspecified: Secondary | ICD-10-CM | POA: Diagnosis not present

## 2015-10-03 DIAGNOSIS — J84115 Respiratory bronchiolitis interstitial lung disease: Secondary | ICD-10-CM | POA: Diagnosis not present

## 2015-10-03 DIAGNOSIS — I429 Cardiomyopathy, unspecified: Secondary | ICD-10-CM | POA: Diagnosis not present

## 2015-10-03 DIAGNOSIS — K5190 Ulcerative colitis, unspecified, without complications: Secondary | ICD-10-CM | POA: Diagnosis not present

## 2015-10-04 DIAGNOSIS — I349 Nonrheumatic mitral valve disorder, unspecified: Secondary | ICD-10-CM | POA: Diagnosis not present

## 2015-10-04 DIAGNOSIS — K5190 Ulcerative colitis, unspecified, without complications: Secondary | ICD-10-CM | POA: Diagnosis not present

## 2015-10-04 DIAGNOSIS — N959 Unspecified menopausal and perimenopausal disorder: Secondary | ICD-10-CM | POA: Diagnosis not present

## 2015-10-04 DIAGNOSIS — J84115 Respiratory bronchiolitis interstitial lung disease: Secondary | ICD-10-CM | POA: Diagnosis not present

## 2015-10-04 DIAGNOSIS — E859 Amyloidosis, unspecified: Secondary | ICD-10-CM | POA: Diagnosis not present

## 2015-10-04 DIAGNOSIS — I429 Cardiomyopathy, unspecified: Secondary | ICD-10-CM | POA: Diagnosis not present

## 2015-10-08 DIAGNOSIS — E859 Amyloidosis, unspecified: Secondary | ICD-10-CM | POA: Diagnosis not present

## 2015-10-08 DIAGNOSIS — J84115 Respiratory bronchiolitis interstitial lung disease: Secondary | ICD-10-CM | POA: Diagnosis not present

## 2015-10-08 DIAGNOSIS — N959 Unspecified menopausal and perimenopausal disorder: Secondary | ICD-10-CM | POA: Diagnosis not present

## 2015-10-08 DIAGNOSIS — I349 Nonrheumatic mitral valve disorder, unspecified: Secondary | ICD-10-CM | POA: Diagnosis not present

## 2015-10-08 DIAGNOSIS — K5190 Ulcerative colitis, unspecified, without complications: Secondary | ICD-10-CM | POA: Diagnosis not present

## 2015-10-08 DIAGNOSIS — I429 Cardiomyopathy, unspecified: Secondary | ICD-10-CM | POA: Diagnosis not present

## 2015-10-09 DIAGNOSIS — E859 Amyloidosis, unspecified: Secondary | ICD-10-CM | POA: Diagnosis not present

## 2015-10-09 DIAGNOSIS — I429 Cardiomyopathy, unspecified: Secondary | ICD-10-CM | POA: Diagnosis not present

## 2015-10-09 DIAGNOSIS — K5190 Ulcerative colitis, unspecified, without complications: Secondary | ICD-10-CM | POA: Diagnosis not present

## 2015-10-09 DIAGNOSIS — J84115 Respiratory bronchiolitis interstitial lung disease: Secondary | ICD-10-CM | POA: Diagnosis not present

## 2015-10-09 DIAGNOSIS — I349 Nonrheumatic mitral valve disorder, unspecified: Secondary | ICD-10-CM | POA: Diagnosis not present

## 2015-10-09 DIAGNOSIS — N959 Unspecified menopausal and perimenopausal disorder: Secondary | ICD-10-CM | POA: Diagnosis not present

## 2015-10-10 DIAGNOSIS — N959 Unspecified menopausal and perimenopausal disorder: Secondary | ICD-10-CM | POA: Diagnosis not present

## 2015-10-10 DIAGNOSIS — J84115 Respiratory bronchiolitis interstitial lung disease: Secondary | ICD-10-CM | POA: Diagnosis not present

## 2015-10-10 DIAGNOSIS — K5190 Ulcerative colitis, unspecified, without complications: Secondary | ICD-10-CM | POA: Diagnosis not present

## 2015-10-10 DIAGNOSIS — I349 Nonrheumatic mitral valve disorder, unspecified: Secondary | ICD-10-CM | POA: Diagnosis not present

## 2015-10-10 DIAGNOSIS — E859 Amyloidosis, unspecified: Secondary | ICD-10-CM | POA: Diagnosis not present

## 2015-10-10 DIAGNOSIS — I429 Cardiomyopathy, unspecified: Secondary | ICD-10-CM | POA: Diagnosis not present

## 2015-10-11 DIAGNOSIS — I429 Cardiomyopathy, unspecified: Secondary | ICD-10-CM | POA: Diagnosis not present

## 2015-10-11 DIAGNOSIS — E859 Amyloidosis, unspecified: Secondary | ICD-10-CM | POA: Diagnosis not present

## 2015-10-11 DIAGNOSIS — N959 Unspecified menopausal and perimenopausal disorder: Secondary | ICD-10-CM | POA: Diagnosis not present

## 2015-10-11 DIAGNOSIS — J84115 Respiratory bronchiolitis interstitial lung disease: Secondary | ICD-10-CM | POA: Diagnosis not present

## 2015-10-11 DIAGNOSIS — K5190 Ulcerative colitis, unspecified, without complications: Secondary | ICD-10-CM | POA: Diagnosis not present

## 2015-10-11 DIAGNOSIS — I349 Nonrheumatic mitral valve disorder, unspecified: Secondary | ICD-10-CM | POA: Diagnosis not present

## 2015-10-14 DIAGNOSIS — N959 Unspecified menopausal and perimenopausal disorder: Secondary | ICD-10-CM | POA: Diagnosis not present

## 2015-10-14 DIAGNOSIS — K5190 Ulcerative colitis, unspecified, without complications: Secondary | ICD-10-CM | POA: Diagnosis not present

## 2015-10-14 DIAGNOSIS — I429 Cardiomyopathy, unspecified: Secondary | ICD-10-CM | POA: Diagnosis not present

## 2015-10-14 DIAGNOSIS — E859 Amyloidosis, unspecified: Secondary | ICD-10-CM | POA: Diagnosis not present

## 2015-10-14 DIAGNOSIS — I349 Nonrheumatic mitral valve disorder, unspecified: Secondary | ICD-10-CM | POA: Diagnosis not present

## 2015-10-14 DIAGNOSIS — J84115 Respiratory bronchiolitis interstitial lung disease: Secondary | ICD-10-CM | POA: Diagnosis not present

## 2015-10-15 DIAGNOSIS — E859 Amyloidosis, unspecified: Secondary | ICD-10-CM | POA: Diagnosis not present

## 2015-10-15 DIAGNOSIS — I349 Nonrheumatic mitral valve disorder, unspecified: Secondary | ICD-10-CM | POA: Diagnosis not present

## 2015-10-15 DIAGNOSIS — J84115 Respiratory bronchiolitis interstitial lung disease: Secondary | ICD-10-CM | POA: Diagnosis not present

## 2015-10-15 DIAGNOSIS — N959 Unspecified menopausal and perimenopausal disorder: Secondary | ICD-10-CM | POA: Diagnosis not present

## 2015-10-15 DIAGNOSIS — K5190 Ulcerative colitis, unspecified, without complications: Secondary | ICD-10-CM | POA: Diagnosis not present

## 2015-10-15 DIAGNOSIS — I429 Cardiomyopathy, unspecified: Secondary | ICD-10-CM | POA: Diagnosis not present

## 2015-10-16 DIAGNOSIS — K5190 Ulcerative colitis, unspecified, without complications: Secondary | ICD-10-CM | POA: Diagnosis not present

## 2015-10-16 DIAGNOSIS — E859 Amyloidosis, unspecified: Secondary | ICD-10-CM | POA: Diagnosis not present

## 2015-10-16 DIAGNOSIS — N959 Unspecified menopausal and perimenopausal disorder: Secondary | ICD-10-CM | POA: Diagnosis not present

## 2015-10-16 DIAGNOSIS — J84115 Respiratory bronchiolitis interstitial lung disease: Secondary | ICD-10-CM | POA: Diagnosis not present

## 2015-10-16 DIAGNOSIS — I349 Nonrheumatic mitral valve disorder, unspecified: Secondary | ICD-10-CM | POA: Diagnosis not present

## 2015-10-16 DIAGNOSIS — I429 Cardiomyopathy, unspecified: Secondary | ICD-10-CM | POA: Diagnosis not present

## 2015-10-17 DIAGNOSIS — I349 Nonrheumatic mitral valve disorder, unspecified: Secondary | ICD-10-CM | POA: Diagnosis not present

## 2015-10-17 DIAGNOSIS — I429 Cardiomyopathy, unspecified: Secondary | ICD-10-CM | POA: Diagnosis not present

## 2015-10-17 DIAGNOSIS — K5190 Ulcerative colitis, unspecified, without complications: Secondary | ICD-10-CM | POA: Diagnosis not present

## 2015-10-17 DIAGNOSIS — E859 Amyloidosis, unspecified: Secondary | ICD-10-CM | POA: Diagnosis not present

## 2015-10-17 DIAGNOSIS — N959 Unspecified menopausal and perimenopausal disorder: Secondary | ICD-10-CM | POA: Diagnosis not present

## 2015-10-17 DIAGNOSIS — J84115 Respiratory bronchiolitis interstitial lung disease: Secondary | ICD-10-CM | POA: Diagnosis not present

## 2015-10-18 DIAGNOSIS — K5190 Ulcerative colitis, unspecified, without complications: Secondary | ICD-10-CM | POA: Diagnosis not present

## 2015-10-18 DIAGNOSIS — E859 Amyloidosis, unspecified: Secondary | ICD-10-CM | POA: Diagnosis not present

## 2015-10-18 DIAGNOSIS — J84115 Respiratory bronchiolitis interstitial lung disease: Secondary | ICD-10-CM | POA: Diagnosis not present

## 2015-10-18 DIAGNOSIS — I349 Nonrheumatic mitral valve disorder, unspecified: Secondary | ICD-10-CM | POA: Diagnosis not present

## 2015-10-18 DIAGNOSIS — N959 Unspecified menopausal and perimenopausal disorder: Secondary | ICD-10-CM | POA: Diagnosis not present

## 2015-10-18 DIAGNOSIS — I429 Cardiomyopathy, unspecified: Secondary | ICD-10-CM | POA: Diagnosis not present

## 2015-10-19 DIAGNOSIS — K5190 Ulcerative colitis, unspecified, without complications: Secondary | ICD-10-CM | POA: Diagnosis not present

## 2015-10-19 DIAGNOSIS — Z86718 Personal history of other venous thrombosis and embolism: Secondary | ICD-10-CM | POA: Diagnosis not present

## 2015-10-19 DIAGNOSIS — I349 Nonrheumatic mitral valve disorder, unspecified: Secondary | ICD-10-CM | POA: Diagnosis not present

## 2015-10-19 DIAGNOSIS — E859 Amyloidosis, unspecified: Secondary | ICD-10-CM | POA: Diagnosis not present

## 2015-10-19 DIAGNOSIS — I429 Cardiomyopathy, unspecified: Secondary | ICD-10-CM | POA: Diagnosis not present

## 2015-10-19 DIAGNOSIS — E039 Hypothyroidism, unspecified: Secondary | ICD-10-CM | POA: Diagnosis not present

## 2015-10-19 DIAGNOSIS — J84115 Respiratory bronchiolitis interstitial lung disease: Secondary | ICD-10-CM | POA: Diagnosis not present

## 2015-10-19 DIAGNOSIS — N959 Unspecified menopausal and perimenopausal disorder: Secondary | ICD-10-CM | POA: Diagnosis not present

## 2015-10-19 DIAGNOSIS — H04129 Dry eye syndrome of unspecified lacrimal gland: Secondary | ICD-10-CM | POA: Diagnosis not present

## 2015-10-23 DIAGNOSIS — I349 Nonrheumatic mitral valve disorder, unspecified: Secondary | ICD-10-CM | POA: Diagnosis not present

## 2015-10-23 DIAGNOSIS — E859 Amyloidosis, unspecified: Secondary | ICD-10-CM | POA: Diagnosis not present

## 2015-10-23 DIAGNOSIS — K5190 Ulcerative colitis, unspecified, without complications: Secondary | ICD-10-CM | POA: Diagnosis not present

## 2015-10-23 DIAGNOSIS — N959 Unspecified menopausal and perimenopausal disorder: Secondary | ICD-10-CM | POA: Diagnosis not present

## 2015-10-23 DIAGNOSIS — I429 Cardiomyopathy, unspecified: Secondary | ICD-10-CM | POA: Diagnosis not present

## 2015-10-23 DIAGNOSIS — J84115 Respiratory bronchiolitis interstitial lung disease: Secondary | ICD-10-CM | POA: Diagnosis not present

## 2015-10-24 DIAGNOSIS — I429 Cardiomyopathy, unspecified: Secondary | ICD-10-CM | POA: Diagnosis not present

## 2015-10-24 DIAGNOSIS — J84115 Respiratory bronchiolitis interstitial lung disease: Secondary | ICD-10-CM | POA: Diagnosis not present

## 2015-10-24 DIAGNOSIS — E859 Amyloidosis, unspecified: Secondary | ICD-10-CM | POA: Diagnosis not present

## 2015-10-24 DIAGNOSIS — N959 Unspecified menopausal and perimenopausal disorder: Secondary | ICD-10-CM | POA: Diagnosis not present

## 2015-10-24 DIAGNOSIS — K5190 Ulcerative colitis, unspecified, without complications: Secondary | ICD-10-CM | POA: Diagnosis not present

## 2015-10-24 DIAGNOSIS — I349 Nonrheumatic mitral valve disorder, unspecified: Secondary | ICD-10-CM | POA: Diagnosis not present

## 2015-10-25 DIAGNOSIS — I429 Cardiomyopathy, unspecified: Secondary | ICD-10-CM | POA: Diagnosis not present

## 2015-10-25 DIAGNOSIS — K5190 Ulcerative colitis, unspecified, without complications: Secondary | ICD-10-CM | POA: Diagnosis not present

## 2015-10-25 DIAGNOSIS — J84115 Respiratory bronchiolitis interstitial lung disease: Secondary | ICD-10-CM | POA: Diagnosis not present

## 2015-10-25 DIAGNOSIS — N959 Unspecified menopausal and perimenopausal disorder: Secondary | ICD-10-CM | POA: Diagnosis not present

## 2015-10-25 DIAGNOSIS — E859 Amyloidosis, unspecified: Secondary | ICD-10-CM | POA: Diagnosis not present

## 2015-10-25 DIAGNOSIS — I349 Nonrheumatic mitral valve disorder, unspecified: Secondary | ICD-10-CM | POA: Diagnosis not present

## 2015-10-28 DIAGNOSIS — N959 Unspecified menopausal and perimenopausal disorder: Secondary | ICD-10-CM | POA: Diagnosis not present

## 2015-10-28 DIAGNOSIS — I349 Nonrheumatic mitral valve disorder, unspecified: Secondary | ICD-10-CM | POA: Diagnosis not present

## 2015-10-28 DIAGNOSIS — J84115 Respiratory bronchiolitis interstitial lung disease: Secondary | ICD-10-CM | POA: Diagnosis not present

## 2015-10-28 DIAGNOSIS — I429 Cardiomyopathy, unspecified: Secondary | ICD-10-CM | POA: Diagnosis not present

## 2015-10-28 DIAGNOSIS — K5190 Ulcerative colitis, unspecified, without complications: Secondary | ICD-10-CM | POA: Diagnosis not present

## 2015-10-28 DIAGNOSIS — E859 Amyloidosis, unspecified: Secondary | ICD-10-CM | POA: Diagnosis not present

## 2015-10-29 DIAGNOSIS — J84115 Respiratory bronchiolitis interstitial lung disease: Secondary | ICD-10-CM | POA: Diagnosis not present

## 2015-10-29 DIAGNOSIS — N959 Unspecified menopausal and perimenopausal disorder: Secondary | ICD-10-CM | POA: Diagnosis not present

## 2015-10-29 DIAGNOSIS — I349 Nonrheumatic mitral valve disorder, unspecified: Secondary | ICD-10-CM | POA: Diagnosis not present

## 2015-10-29 DIAGNOSIS — K5190 Ulcerative colitis, unspecified, without complications: Secondary | ICD-10-CM | POA: Diagnosis not present

## 2015-10-29 DIAGNOSIS — E859 Amyloidosis, unspecified: Secondary | ICD-10-CM | POA: Diagnosis not present

## 2015-10-29 DIAGNOSIS — I429 Cardiomyopathy, unspecified: Secondary | ICD-10-CM | POA: Diagnosis not present

## 2015-10-30 DIAGNOSIS — I429 Cardiomyopathy, unspecified: Secondary | ICD-10-CM | POA: Diagnosis not present

## 2015-10-30 DIAGNOSIS — J84115 Respiratory bronchiolitis interstitial lung disease: Secondary | ICD-10-CM | POA: Diagnosis not present

## 2015-10-30 DIAGNOSIS — K5190 Ulcerative colitis, unspecified, without complications: Secondary | ICD-10-CM | POA: Diagnosis not present

## 2015-10-30 DIAGNOSIS — I349 Nonrheumatic mitral valve disorder, unspecified: Secondary | ICD-10-CM | POA: Diagnosis not present

## 2015-10-30 DIAGNOSIS — E859 Amyloidosis, unspecified: Secondary | ICD-10-CM | POA: Diagnosis not present

## 2015-10-30 DIAGNOSIS — N959 Unspecified menopausal and perimenopausal disorder: Secondary | ICD-10-CM | POA: Diagnosis not present

## 2015-10-31 DIAGNOSIS — K5190 Ulcerative colitis, unspecified, without complications: Secondary | ICD-10-CM | POA: Diagnosis not present

## 2015-10-31 DIAGNOSIS — I429 Cardiomyopathy, unspecified: Secondary | ICD-10-CM | POA: Diagnosis not present

## 2015-10-31 DIAGNOSIS — E859 Amyloidosis, unspecified: Secondary | ICD-10-CM | POA: Diagnosis not present

## 2015-10-31 DIAGNOSIS — I349 Nonrheumatic mitral valve disorder, unspecified: Secondary | ICD-10-CM | POA: Diagnosis not present

## 2015-10-31 DIAGNOSIS — N959 Unspecified menopausal and perimenopausal disorder: Secondary | ICD-10-CM | POA: Diagnosis not present

## 2015-10-31 DIAGNOSIS — J84115 Respiratory bronchiolitis interstitial lung disease: Secondary | ICD-10-CM | POA: Diagnosis not present

## 2015-11-01 DIAGNOSIS — N959 Unspecified menopausal and perimenopausal disorder: Secondary | ICD-10-CM | POA: Diagnosis not present

## 2015-11-01 DIAGNOSIS — K5190 Ulcerative colitis, unspecified, without complications: Secondary | ICD-10-CM | POA: Diagnosis not present

## 2015-11-01 DIAGNOSIS — E859 Amyloidosis, unspecified: Secondary | ICD-10-CM | POA: Diagnosis not present

## 2015-11-01 DIAGNOSIS — I349 Nonrheumatic mitral valve disorder, unspecified: Secondary | ICD-10-CM | POA: Diagnosis not present

## 2015-11-01 DIAGNOSIS — I429 Cardiomyopathy, unspecified: Secondary | ICD-10-CM | POA: Diagnosis not present

## 2015-11-01 DIAGNOSIS — J84115 Respiratory bronchiolitis interstitial lung disease: Secondary | ICD-10-CM | POA: Diagnosis not present

## 2015-11-04 DIAGNOSIS — E859 Amyloidosis, unspecified: Secondary | ICD-10-CM | POA: Diagnosis not present

## 2015-11-04 DIAGNOSIS — J84115 Respiratory bronchiolitis interstitial lung disease: Secondary | ICD-10-CM | POA: Diagnosis not present

## 2015-11-04 DIAGNOSIS — K5190 Ulcerative colitis, unspecified, without complications: Secondary | ICD-10-CM | POA: Diagnosis not present

## 2015-11-04 DIAGNOSIS — I349 Nonrheumatic mitral valve disorder, unspecified: Secondary | ICD-10-CM | POA: Diagnosis not present

## 2015-11-04 DIAGNOSIS — N959 Unspecified menopausal and perimenopausal disorder: Secondary | ICD-10-CM | POA: Diagnosis not present

## 2015-11-04 DIAGNOSIS — I429 Cardiomyopathy, unspecified: Secondary | ICD-10-CM | POA: Diagnosis not present

## 2015-11-06 DIAGNOSIS — K5190 Ulcerative colitis, unspecified, without complications: Secondary | ICD-10-CM | POA: Diagnosis not present

## 2015-11-06 DIAGNOSIS — N959 Unspecified menopausal and perimenopausal disorder: Secondary | ICD-10-CM | POA: Diagnosis not present

## 2015-11-06 DIAGNOSIS — I429 Cardiomyopathy, unspecified: Secondary | ICD-10-CM | POA: Diagnosis not present

## 2015-11-06 DIAGNOSIS — J84115 Respiratory bronchiolitis interstitial lung disease: Secondary | ICD-10-CM | POA: Diagnosis not present

## 2015-11-06 DIAGNOSIS — I349 Nonrheumatic mitral valve disorder, unspecified: Secondary | ICD-10-CM | POA: Diagnosis not present

## 2015-11-06 DIAGNOSIS — E859 Amyloidosis, unspecified: Secondary | ICD-10-CM | POA: Diagnosis not present

## 2015-11-07 DIAGNOSIS — I429 Cardiomyopathy, unspecified: Secondary | ICD-10-CM | POA: Diagnosis not present

## 2015-11-07 DIAGNOSIS — K5190 Ulcerative colitis, unspecified, without complications: Secondary | ICD-10-CM | POA: Diagnosis not present

## 2015-11-07 DIAGNOSIS — E859 Amyloidosis, unspecified: Secondary | ICD-10-CM | POA: Diagnosis not present

## 2015-11-07 DIAGNOSIS — J84115 Respiratory bronchiolitis interstitial lung disease: Secondary | ICD-10-CM | POA: Diagnosis not present

## 2015-11-07 DIAGNOSIS — I349 Nonrheumatic mitral valve disorder, unspecified: Secondary | ICD-10-CM | POA: Diagnosis not present

## 2015-11-07 DIAGNOSIS — N959 Unspecified menopausal and perimenopausal disorder: Secondary | ICD-10-CM | POA: Diagnosis not present

## 2015-11-08 DIAGNOSIS — N959 Unspecified menopausal and perimenopausal disorder: Secondary | ICD-10-CM | POA: Diagnosis not present

## 2015-11-08 DIAGNOSIS — I349 Nonrheumatic mitral valve disorder, unspecified: Secondary | ICD-10-CM | POA: Diagnosis not present

## 2015-11-08 DIAGNOSIS — K5190 Ulcerative colitis, unspecified, without complications: Secondary | ICD-10-CM | POA: Diagnosis not present

## 2015-11-08 DIAGNOSIS — J84115 Respiratory bronchiolitis interstitial lung disease: Secondary | ICD-10-CM | POA: Diagnosis not present

## 2015-11-08 DIAGNOSIS — I429 Cardiomyopathy, unspecified: Secondary | ICD-10-CM | POA: Diagnosis not present

## 2015-11-08 DIAGNOSIS — E859 Amyloidosis, unspecified: Secondary | ICD-10-CM | POA: Diagnosis not present

## 2015-11-11 DIAGNOSIS — I10 Essential (primary) hypertension: Secondary | ICD-10-CM | POA: Diagnosis not present

## 2015-11-11 DIAGNOSIS — J84115 Respiratory bronchiolitis interstitial lung disease: Secondary | ICD-10-CM | POA: Diagnosis not present

## 2015-11-11 DIAGNOSIS — N959 Unspecified menopausal and perimenopausal disorder: Secondary | ICD-10-CM | POA: Diagnosis not present

## 2015-11-11 DIAGNOSIS — I482 Chronic atrial fibrillation: Secondary | ICD-10-CM | POA: Diagnosis not present

## 2015-11-11 DIAGNOSIS — I509 Heart failure, unspecified: Secondary | ICD-10-CM | POA: Diagnosis not present

## 2015-11-11 DIAGNOSIS — E859 Amyloidosis, unspecified: Secondary | ICD-10-CM | POA: Diagnosis not present

## 2015-11-11 DIAGNOSIS — I429 Cardiomyopathy, unspecified: Secondary | ICD-10-CM | POA: Diagnosis not present

## 2015-11-11 DIAGNOSIS — I349 Nonrheumatic mitral valve disorder, unspecified: Secondary | ICD-10-CM | POA: Diagnosis not present

## 2015-11-11 DIAGNOSIS — K5190 Ulcerative colitis, unspecified, without complications: Secondary | ICD-10-CM | POA: Diagnosis not present

## 2015-11-11 DIAGNOSIS — E854 Organ-limited amyloidosis: Secondary | ICD-10-CM | POA: Diagnosis not present

## 2015-11-13 DIAGNOSIS — K5190 Ulcerative colitis, unspecified, without complications: Secondary | ICD-10-CM | POA: Diagnosis not present

## 2015-11-13 DIAGNOSIS — E859 Amyloidosis, unspecified: Secondary | ICD-10-CM | POA: Diagnosis not present

## 2015-11-13 DIAGNOSIS — N959 Unspecified menopausal and perimenopausal disorder: Secondary | ICD-10-CM | POA: Diagnosis not present

## 2015-11-13 DIAGNOSIS — I349 Nonrheumatic mitral valve disorder, unspecified: Secondary | ICD-10-CM | POA: Diagnosis not present

## 2015-11-13 DIAGNOSIS — I429 Cardiomyopathy, unspecified: Secondary | ICD-10-CM | POA: Diagnosis not present

## 2015-11-13 DIAGNOSIS — J84115 Respiratory bronchiolitis interstitial lung disease: Secondary | ICD-10-CM | POA: Diagnosis not present

## 2015-11-14 DIAGNOSIS — I349 Nonrheumatic mitral valve disorder, unspecified: Secondary | ICD-10-CM | POA: Diagnosis not present

## 2015-11-14 DIAGNOSIS — E859 Amyloidosis, unspecified: Secondary | ICD-10-CM | POA: Diagnosis not present

## 2015-11-14 DIAGNOSIS — J84115 Respiratory bronchiolitis interstitial lung disease: Secondary | ICD-10-CM | POA: Diagnosis not present

## 2015-11-14 DIAGNOSIS — K5190 Ulcerative colitis, unspecified, without complications: Secondary | ICD-10-CM | POA: Diagnosis not present

## 2015-11-14 DIAGNOSIS — I429 Cardiomyopathy, unspecified: Secondary | ICD-10-CM | POA: Diagnosis not present

## 2015-11-14 DIAGNOSIS — N959 Unspecified menopausal and perimenopausal disorder: Secondary | ICD-10-CM | POA: Diagnosis not present

## 2015-11-15 DIAGNOSIS — K5190 Ulcerative colitis, unspecified, without complications: Secondary | ICD-10-CM | POA: Diagnosis not present

## 2015-11-15 DIAGNOSIS — E859 Amyloidosis, unspecified: Secondary | ICD-10-CM | POA: Diagnosis not present

## 2015-11-15 DIAGNOSIS — J84115 Respiratory bronchiolitis interstitial lung disease: Secondary | ICD-10-CM | POA: Diagnosis not present

## 2015-11-15 DIAGNOSIS — I429 Cardiomyopathy, unspecified: Secondary | ICD-10-CM | POA: Diagnosis not present

## 2015-11-15 DIAGNOSIS — N959 Unspecified menopausal and perimenopausal disorder: Secondary | ICD-10-CM | POA: Diagnosis not present

## 2015-11-15 DIAGNOSIS — I349 Nonrheumatic mitral valve disorder, unspecified: Secondary | ICD-10-CM | POA: Diagnosis not present

## 2015-12-05 DIAGNOSIS — S9031XA Contusion of right foot, initial encounter: Secondary | ICD-10-CM | POA: Diagnosis not present

## 2015-12-09 DIAGNOSIS — Z4682 Encounter for fitting and adjustment of non-vascular catheter: Secondary | ICD-10-CM | POA: Diagnosis not present

## 2015-12-09 DIAGNOSIS — J849 Interstitial pulmonary disease, unspecified: Secondary | ICD-10-CM | POA: Diagnosis not present

## 2015-12-09 DIAGNOSIS — J90 Pleural effusion, not elsewhere classified: Secondary | ICD-10-CM | POA: Diagnosis not present

## 2015-12-09 DIAGNOSIS — Z7982 Long term (current) use of aspirin: Secondary | ICD-10-CM | POA: Diagnosis not present

## 2015-12-09 DIAGNOSIS — I5032 Chronic diastolic (congestive) heart failure: Secondary | ICD-10-CM | POA: Diagnosis not present

## 2015-12-09 DIAGNOSIS — Z7901 Long term (current) use of anticoagulants: Secondary | ICD-10-CM | POA: Diagnosis not present

## 2015-12-11 DIAGNOSIS — N952 Postmenopausal atrophic vaginitis: Secondary | ICD-10-CM | POA: Diagnosis not present

## 2015-12-13 ENCOUNTER — Ambulatory Visit (INDEPENDENT_AMBULATORY_CARE_PROVIDER_SITE_OTHER): Payer: Medicare Other | Admitting: Orthopedic Surgery

## 2015-12-13 ENCOUNTER — Encounter: Payer: Self-pay | Admitting: Orthopedic Surgery

## 2015-12-13 VITALS — BP 106/59 | HR 88 | Ht 69.0 in | Wt 122.0 lb

## 2015-12-13 DIAGNOSIS — S9031XA Contusion of right foot, initial encounter: Secondary | ICD-10-CM | POA: Diagnosis not present

## 2015-12-13 NOTE — Patient Instructions (Signed)
WEAR SHOE X 4 WEEKS

## 2015-12-13 NOTE — Progress Notes (Signed)
New patient  Chief complaint pain right foot  History patient dropped a frying pan on her foot week ago still complains of pain  Review of systems she listed her review of systems as normal  Expanded exam BP (!) 106/59   Pulse 88   Ht 5' 9"  (1.753 m)   Wt 122 lb (55.3 kg)   BMI 18.02 kg/m  She is awake she is alert and oriented 3 her mood and affect are normal she walks with a postop shoe with an altered gait her right foot is swollen she has a previous bunion incision with recurrent bunion. She has painful restricted range of motion at the MTP joint without instability skin looks a little bruised color is normal.  X-ray on the disc shows questionable fracture of the sesamoid and the metatarsal. I suspect this is a contusion  Contusion right foot  Treatment won't change with new x-ray so we will use symptomatic treatment continue the postop shoe and follow-up in 4 weeks

## 2015-12-24 ENCOUNTER — Other Ambulatory Visit: Payer: Self-pay

## 2016-01-11 DIAGNOSIS — Z23 Encounter for immunization: Secondary | ICD-10-CM | POA: Diagnosis not present

## 2016-01-13 ENCOUNTER — Ambulatory Visit (INDEPENDENT_AMBULATORY_CARE_PROVIDER_SITE_OTHER): Payer: Medicare Other | Admitting: Orthopedic Surgery

## 2016-01-13 ENCOUNTER — Encounter: Payer: Self-pay | Admitting: Orthopedic Surgery

## 2016-01-13 VITALS — BP 101/59 | HR 116 | Wt 123.0 lb

## 2016-01-13 DIAGNOSIS — S9031XA Contusion of right foot, initial encounter: Secondary | ICD-10-CM | POA: Diagnosis not present

## 2016-01-13 NOTE — Progress Notes (Signed)
Follow-up visit status post contusion right foot  Treated with postop shoe  Patient complains of improved symptoms walking well  Exam shows no tenderness  Patient can be released

## 2016-02-11 DIAGNOSIS — J9611 Chronic respiratory failure with hypoxia: Secondary | ICD-10-CM | POA: Diagnosis not present

## 2016-02-11 DIAGNOSIS — K210 Gastro-esophageal reflux disease with esophagitis: Secondary | ICD-10-CM | POA: Diagnosis not present

## 2016-02-11 DIAGNOSIS — E854 Organ-limited amyloidosis: Secondary | ICD-10-CM | POA: Diagnosis not present

## 2016-02-11 DIAGNOSIS — I482 Chronic atrial fibrillation: Secondary | ICD-10-CM | POA: Diagnosis not present

## 2016-04-21 DIAGNOSIS — N952 Postmenopausal atrophic vaginitis: Secondary | ICD-10-CM | POA: Diagnosis not present

## 2016-05-13 DIAGNOSIS — R634 Abnormal weight loss: Secondary | ICD-10-CM | POA: Diagnosis not present

## 2016-05-13 DIAGNOSIS — E854 Organ-limited amyloidosis: Secondary | ICD-10-CM | POA: Diagnosis not present

## 2016-05-13 DIAGNOSIS — K5901 Slow transit constipation: Secondary | ICD-10-CM | POA: Diagnosis not present

## 2016-05-13 DIAGNOSIS — J9611 Chronic respiratory failure with hypoxia: Secondary | ICD-10-CM | POA: Diagnosis not present

## 2016-07-09 DIAGNOSIS — N952 Postmenopausal atrophic vaginitis: Secondary | ICD-10-CM | POA: Diagnosis not present

## 2016-08-11 DIAGNOSIS — I509 Heart failure, unspecified: Secondary | ICD-10-CM | POA: Diagnosis not present

## 2016-08-11 DIAGNOSIS — K5904 Chronic idiopathic constipation: Secondary | ICD-10-CM | POA: Diagnosis not present

## 2016-08-11 DIAGNOSIS — E854 Organ-limited amyloidosis: Secondary | ICD-10-CM | POA: Diagnosis not present

## 2016-08-11 DIAGNOSIS — J9611 Chronic respiratory failure with hypoxia: Secondary | ICD-10-CM | POA: Diagnosis not present

## 2016-08-14 DIAGNOSIS — I480 Paroxysmal atrial fibrillation: Secondary | ICD-10-CM | POA: Diagnosis not present

## 2016-08-14 DIAGNOSIS — I425 Other restrictive cardiomyopathy: Secondary | ICD-10-CM | POA: Diagnosis not present

## 2016-08-14 DIAGNOSIS — I43 Cardiomyopathy in diseases classified elsewhere: Secondary | ICD-10-CM | POA: Diagnosis not present

## 2016-08-14 DIAGNOSIS — I5030 Unspecified diastolic (congestive) heart failure: Secondary | ICD-10-CM | POA: Diagnosis not present

## 2016-08-14 DIAGNOSIS — E854 Organ-limited amyloidosis: Secondary | ICD-10-CM | POA: Diagnosis not present

## 2016-08-14 DIAGNOSIS — E8581 Light chain (AL) amyloidosis: Secondary | ICD-10-CM | POA: Diagnosis not present

## 2016-09-16 DIAGNOSIS — I272 Pulmonary hypertension, unspecified: Secondary | ICD-10-CM | POA: Diagnosis not present

## 2016-09-16 DIAGNOSIS — I340 Nonrheumatic mitral (valve) insufficiency: Secondary | ICD-10-CM | POA: Diagnosis not present

## 2016-09-16 DIAGNOSIS — I5030 Unspecified diastolic (congestive) heart failure: Secondary | ICD-10-CM | POA: Diagnosis not present

## 2016-09-16 DIAGNOSIS — I425 Other restrictive cardiomyopathy: Secondary | ICD-10-CM | POA: Diagnosis not present

## 2016-09-16 DIAGNOSIS — I081 Rheumatic disorders of both mitral and tricuspid valves: Secondary | ICD-10-CM | POA: Diagnosis not present

## 2016-09-16 DIAGNOSIS — I361 Nonrheumatic tricuspid (valve) insufficiency: Secondary | ICD-10-CM | POA: Diagnosis not present

## 2016-10-27 DIAGNOSIS — N952 Postmenopausal atrophic vaginitis: Secondary | ICD-10-CM | POA: Diagnosis not present

## 2016-11-10 DIAGNOSIS — Z23 Encounter for immunization: Secondary | ICD-10-CM | POA: Diagnosis not present

## 2016-11-10 DIAGNOSIS — I10 Essential (primary) hypertension: Secondary | ICD-10-CM | POA: Diagnosis not present

## 2016-11-10 DIAGNOSIS — E854 Organ-limited amyloidosis: Secondary | ICD-10-CM | POA: Diagnosis not present

## 2016-11-10 DIAGNOSIS — I5030 Unspecified diastolic (congestive) heart failure: Secondary | ICD-10-CM | POA: Diagnosis not present

## 2016-11-10 DIAGNOSIS — S81811A Laceration without foreign body, right lower leg, initial encounter: Secondary | ICD-10-CM | POA: Diagnosis not present

## 2016-11-17 DIAGNOSIS — H6123 Impacted cerumen, bilateral: Secondary | ICD-10-CM | POA: Diagnosis not present

## 2016-11-17 DIAGNOSIS — H6091 Unspecified otitis externa, right ear: Secondary | ICD-10-CM | POA: Diagnosis not present

## 2016-12-07 ENCOUNTER — Ambulatory Visit (INDEPENDENT_AMBULATORY_CARE_PROVIDER_SITE_OTHER): Payer: Medicare Other | Admitting: Otolaryngology

## 2016-12-07 DIAGNOSIS — H903 Sensorineural hearing loss, bilateral: Secondary | ICD-10-CM

## 2016-12-07 DIAGNOSIS — H6123 Impacted cerumen, bilateral: Secondary | ICD-10-CM | POA: Diagnosis not present

## 2017-01-11 DIAGNOSIS — M1812 Unilateral primary osteoarthritis of first carpometacarpal joint, left hand: Secondary | ICD-10-CM | POA: Insufficient documentation

## 2017-01-11 DIAGNOSIS — M25532 Pain in left wrist: Secondary | ICD-10-CM | POA: Diagnosis not present

## 2017-01-19 DIAGNOSIS — Z23 Encounter for immunization: Secondary | ICD-10-CM | POA: Diagnosis not present

## 2017-02-01 DIAGNOSIS — M25532 Pain in left wrist: Secondary | ICD-10-CM | POA: Diagnosis not present

## 2017-02-16 DIAGNOSIS — I10 Essential (primary) hypertension: Secondary | ICD-10-CM | POA: Diagnosis not present

## 2017-02-16 DIAGNOSIS — G5602 Carpal tunnel syndrome, left upper limb: Secondary | ICD-10-CM | POA: Diagnosis not present

## 2017-02-16 DIAGNOSIS — I482 Chronic atrial fibrillation: Secondary | ICD-10-CM | POA: Diagnosis not present

## 2017-02-16 DIAGNOSIS — M25532 Pain in left wrist: Secondary | ICD-10-CM | POA: Diagnosis not present

## 2017-02-16 DIAGNOSIS — G5612 Other lesions of median nerve, left upper limb: Secondary | ICD-10-CM | POA: Diagnosis not present

## 2017-02-16 DIAGNOSIS — E854 Organ-limited amyloidosis: Secondary | ICD-10-CM | POA: Diagnosis not present

## 2017-02-16 DIAGNOSIS — I5030 Unspecified diastolic (congestive) heart failure: Secondary | ICD-10-CM | POA: Diagnosis not present

## 2017-02-16 DIAGNOSIS — M1812 Unilateral primary osteoarthritis of first carpometacarpal joint, left hand: Secondary | ICD-10-CM | POA: Diagnosis not present

## 2017-02-19 DIAGNOSIS — J40 Bronchitis, not specified as acute or chronic: Secondary | ICD-10-CM | POA: Diagnosis not present

## 2017-02-25 DIAGNOSIS — N952 Postmenopausal atrophic vaginitis: Secondary | ICD-10-CM | POA: Diagnosis not present

## 2017-03-25 DIAGNOSIS — I480 Paroxysmal atrial fibrillation: Secondary | ICD-10-CM | POA: Diagnosis not present

## 2017-03-25 DIAGNOSIS — I425 Other restrictive cardiomyopathy: Secondary | ICD-10-CM | POA: Diagnosis not present

## 2017-03-25 DIAGNOSIS — R0789 Other chest pain: Secondary | ICD-10-CM | POA: Diagnosis not present

## 2017-03-25 DIAGNOSIS — I517 Cardiomegaly: Secondary | ICD-10-CM | POA: Diagnosis not present

## 2017-03-25 DIAGNOSIS — I081 Rheumatic disorders of both mitral and tricuspid valves: Secondary | ICD-10-CM | POA: Diagnosis not present

## 2017-03-25 DIAGNOSIS — Z515 Encounter for palliative care: Secondary | ICD-10-CM | POA: Diagnosis not present

## 2017-03-25 DIAGNOSIS — I43 Cardiomyopathy in diseases classified elsewhere: Secondary | ICD-10-CM | POA: Diagnosis not present

## 2017-03-25 DIAGNOSIS — Z66 Do not resuscitate: Secondary | ICD-10-CM | POA: Diagnosis not present

## 2017-03-25 DIAGNOSIS — R079 Chest pain, unspecified: Secondary | ICD-10-CM | POA: Diagnosis not present

## 2017-03-25 DIAGNOSIS — I491 Atrial premature depolarization: Secondary | ICD-10-CM | POA: Diagnosis not present

## 2017-03-25 DIAGNOSIS — E8581 Light chain (AL) amyloidosis: Secondary | ICD-10-CM | POA: Diagnosis not present

## 2017-03-25 DIAGNOSIS — Z7901 Long term (current) use of anticoagulants: Secondary | ICD-10-CM | POA: Diagnosis not present

## 2017-03-25 DIAGNOSIS — R931 Abnormal findings on diagnostic imaging of heart and coronary circulation: Secondary | ICD-10-CM | POA: Diagnosis not present

## 2017-03-25 DIAGNOSIS — E039 Hypothyroidism, unspecified: Secondary | ICD-10-CM | POA: Diagnosis not present

## 2017-03-25 DIAGNOSIS — R002 Palpitations: Secondary | ICD-10-CM | POA: Diagnosis not present

## 2017-03-25 DIAGNOSIS — I5032 Chronic diastolic (congestive) heart failure: Secondary | ICD-10-CM | POA: Diagnosis not present

## 2017-03-25 DIAGNOSIS — Z79899 Other long term (current) drug therapy: Secondary | ICD-10-CM | POA: Diagnosis not present

## 2017-03-25 DIAGNOSIS — R9431 Abnormal electrocardiogram [ECG] [EKG]: Secondary | ICD-10-CM | POA: Diagnosis not present

## 2017-03-25 DIAGNOSIS — R918 Other nonspecific abnormal finding of lung field: Secondary | ICD-10-CM | POA: Diagnosis not present

## 2017-03-25 DIAGNOSIS — R0602 Shortness of breath: Secondary | ICD-10-CM | POA: Diagnosis not present

## 2017-03-25 DIAGNOSIS — Z5181 Encounter for therapeutic drug level monitoring: Secondary | ICD-10-CM | POA: Diagnosis not present

## 2017-03-25 DIAGNOSIS — I5030 Unspecified diastolic (congestive) heart failure: Secondary | ICD-10-CM | POA: Diagnosis not present

## 2017-03-25 DIAGNOSIS — E854 Organ-limited amyloidosis: Secondary | ICD-10-CM | POA: Diagnosis not present

## 2017-03-25 DIAGNOSIS — J849 Interstitial pulmonary disease, unspecified: Secondary | ICD-10-CM | POA: Diagnosis not present

## 2017-03-26 DIAGNOSIS — E039 Hypothyroidism, unspecified: Secondary | ICD-10-CM | POA: Diagnosis not present

## 2017-03-26 DIAGNOSIS — I480 Paroxysmal atrial fibrillation: Secondary | ICD-10-CM | POA: Diagnosis not present

## 2017-03-26 DIAGNOSIS — R0602 Shortness of breath: Secondary | ICD-10-CM | POA: Diagnosis not present

## 2017-03-26 DIAGNOSIS — I517 Cardiomegaly: Secondary | ICD-10-CM | POA: Diagnosis not present

## 2017-03-26 DIAGNOSIS — E8581 Light chain (AL) amyloidosis: Secondary | ICD-10-CM | POA: Diagnosis not present

## 2017-03-26 DIAGNOSIS — R931 Abnormal findings on diagnostic imaging of heart and coronary circulation: Secondary | ICD-10-CM | POA: Diagnosis not present

## 2017-03-26 DIAGNOSIS — J849 Interstitial pulmonary disease, unspecified: Secondary | ICD-10-CM | POA: Diagnosis not present

## 2017-03-26 DIAGNOSIS — I081 Rheumatic disorders of both mitral and tricuspid valves: Secondary | ICD-10-CM | POA: Diagnosis not present

## 2017-03-26 DIAGNOSIS — I491 Atrial premature depolarization: Secondary | ICD-10-CM | POA: Diagnosis not present

## 2017-03-26 DIAGNOSIS — R002 Palpitations: Secondary | ICD-10-CM | POA: Diagnosis not present

## 2017-03-26 DIAGNOSIS — I5030 Unspecified diastolic (congestive) heart failure: Secondary | ICD-10-CM | POA: Diagnosis not present

## 2017-03-26 DIAGNOSIS — I425 Other restrictive cardiomyopathy: Secondary | ICD-10-CM | POA: Diagnosis not present

## 2017-04-05 DIAGNOSIS — I440 Atrioventricular block, first degree: Secondary | ICD-10-CM | POA: Diagnosis not present

## 2017-04-05 DIAGNOSIS — R918 Other nonspecific abnormal finding of lung field: Secondary | ICD-10-CM | POA: Diagnosis not present

## 2017-04-05 DIAGNOSIS — R0789 Other chest pain: Secondary | ICD-10-CM | POA: Diagnosis not present

## 2017-04-05 DIAGNOSIS — J984 Other disorders of lung: Secondary | ICD-10-CM | POA: Diagnosis not present

## 2017-04-05 DIAGNOSIS — I480 Paroxysmal atrial fibrillation: Secondary | ICD-10-CM | POA: Diagnosis not present

## 2017-04-05 DIAGNOSIS — E854 Organ-limited amyloidosis: Secondary | ICD-10-CM | POA: Diagnosis not present

## 2017-04-05 DIAGNOSIS — I5033 Acute on chronic diastolic (congestive) heart failure: Secondary | ICD-10-CM | POA: Diagnosis not present

## 2017-04-05 DIAGNOSIS — J811 Chronic pulmonary edema: Secondary | ICD-10-CM | POA: Diagnosis not present

## 2017-04-05 DIAGNOSIS — E8581 Light chain (AL) amyloidosis: Secondary | ICD-10-CM | POA: Diagnosis not present

## 2017-04-05 DIAGNOSIS — R110 Nausea: Secondary | ICD-10-CM | POA: Diagnosis not present

## 2017-04-05 DIAGNOSIS — I441 Atrioventricular block, second degree: Secondary | ICD-10-CM | POA: Diagnosis not present

## 2017-04-05 DIAGNOSIS — R531 Weakness: Secondary | ICD-10-CM | POA: Diagnosis not present

## 2017-04-05 DIAGNOSIS — J849 Interstitial pulmonary disease, unspecified: Secondary | ICD-10-CM | POA: Diagnosis not present

## 2017-04-05 DIAGNOSIS — I442 Atrioventricular block, complete: Secondary | ICD-10-CM | POA: Diagnosis not present

## 2017-04-05 DIAGNOSIS — I425 Other restrictive cardiomyopathy: Secondary | ICD-10-CM | POA: Diagnosis not present

## 2017-04-06 DIAGNOSIS — I471 Supraventricular tachycardia: Secondary | ICD-10-CM | POA: Diagnosis not present

## 2017-04-06 DIAGNOSIS — R0789 Other chest pain: Secondary | ICD-10-CM | POA: Diagnosis not present

## 2017-04-06 DIAGNOSIS — R5383 Other fatigue: Secondary | ICD-10-CM | POA: Diagnosis present

## 2017-04-06 DIAGNOSIS — R110 Nausea: Secondary | ICD-10-CM | POA: Diagnosis not present

## 2017-04-06 DIAGNOSIS — E8581 Light chain (AL) amyloidosis: Secondary | ICD-10-CM | POA: Diagnosis present

## 2017-04-06 DIAGNOSIS — I440 Atrioventricular block, first degree: Secondary | ICD-10-CM | POA: Diagnosis not present

## 2017-04-06 DIAGNOSIS — K5190 Ulcerative colitis, unspecified, without complications: Secondary | ICD-10-CM | POA: Diagnosis present

## 2017-04-06 DIAGNOSIS — I340 Nonrheumatic mitral (valve) insufficiency: Secondary | ICD-10-CM | POA: Diagnosis present

## 2017-04-06 DIAGNOSIS — I425 Other restrictive cardiomyopathy: Secondary | ICD-10-CM | POA: Diagnosis present

## 2017-04-06 DIAGNOSIS — I5030 Unspecified diastolic (congestive) heart failure: Secondary | ICD-10-CM | POA: Diagnosis not present

## 2017-04-06 DIAGNOSIS — J984 Other disorders of lung: Secondary | ICD-10-CM | POA: Diagnosis not present

## 2017-04-06 DIAGNOSIS — J849 Interstitial pulmonary disease, unspecified: Secondary | ICD-10-CM | POA: Diagnosis present

## 2017-04-06 DIAGNOSIS — Z833 Family history of diabetes mellitus: Secondary | ICD-10-CM | POA: Diagnosis not present

## 2017-04-06 DIAGNOSIS — I480 Paroxysmal atrial fibrillation: Secondary | ICD-10-CM | POA: Diagnosis not present

## 2017-04-06 DIAGNOSIS — I5033 Acute on chronic diastolic (congestive) heart failure: Secondary | ICD-10-CM | POA: Diagnosis present

## 2017-04-06 DIAGNOSIS — Z7901 Long term (current) use of anticoagulants: Secondary | ICD-10-CM | POA: Diagnosis not present

## 2017-04-06 DIAGNOSIS — Z882 Allergy status to sulfonamides status: Secondary | ICD-10-CM | POA: Diagnosis not present

## 2017-04-06 DIAGNOSIS — I509 Heart failure, unspecified: Secondary | ICD-10-CM | POA: Diagnosis not present

## 2017-04-06 DIAGNOSIS — J811 Chronic pulmonary edema: Secondary | ICD-10-CM | POA: Diagnosis not present

## 2017-04-06 DIAGNOSIS — I361 Nonrheumatic tricuspid (valve) insufficiency: Secondary | ICD-10-CM | POA: Diagnosis present

## 2017-04-06 DIAGNOSIS — Z8249 Family history of ischemic heart disease and other diseases of the circulatory system: Secondary | ICD-10-CM | POA: Diagnosis not present

## 2017-04-06 DIAGNOSIS — I43 Cardiomyopathy in diseases classified elsewhere: Secondary | ICD-10-CM | POA: Diagnosis not present

## 2017-04-06 DIAGNOSIS — Z79899 Other long term (current) drug therapy: Secondary | ICD-10-CM | POA: Diagnosis not present

## 2017-04-06 DIAGNOSIS — E039 Hypothyroidism, unspecified: Secondary | ICD-10-CM | POA: Diagnosis not present

## 2017-04-06 DIAGNOSIS — R918 Other nonspecific abnormal finding of lung field: Secondary | ICD-10-CM | POA: Diagnosis not present

## 2017-04-06 DIAGNOSIS — R5381 Other malaise: Secondary | ICD-10-CM | POA: Diagnosis present

## 2017-04-06 DIAGNOSIS — I442 Atrioventricular block, complete: Secondary | ICD-10-CM | POA: Diagnosis not present

## 2017-04-06 DIAGNOSIS — R001 Bradycardia, unspecified: Secondary | ICD-10-CM | POA: Diagnosis present

## 2017-04-06 DIAGNOSIS — E854 Organ-limited amyloidosis: Secondary | ICD-10-CM | POA: Diagnosis not present

## 2017-04-06 DIAGNOSIS — R531 Weakness: Secondary | ICD-10-CM | POA: Diagnosis not present

## 2017-04-06 DIAGNOSIS — R627 Adult failure to thrive: Secondary | ICD-10-CM | POA: Diagnosis present

## 2017-04-07 DIAGNOSIS — Z8679 Personal history of other diseases of the circulatory system: Secondary | ICD-10-CM | POA: Insufficient documentation

## 2017-04-22 DIAGNOSIS — I5030 Unspecified diastolic (congestive) heart failure: Secondary | ICD-10-CM | POA: Diagnosis not present

## 2017-04-22 DIAGNOSIS — J9611 Chronic respiratory failure with hypoxia: Secondary | ICD-10-CM | POA: Diagnosis not present

## 2017-04-22 DIAGNOSIS — I482 Chronic atrial fibrillation: Secondary | ICD-10-CM | POA: Diagnosis not present

## 2017-04-22 DIAGNOSIS — I10 Essential (primary) hypertension: Secondary | ICD-10-CM | POA: Diagnosis not present

## 2017-05-11 DIAGNOSIS — I5030 Unspecified diastolic (congestive) heart failure: Secondary | ICD-10-CM | POA: Diagnosis not present

## 2017-05-11 DIAGNOSIS — I10 Essential (primary) hypertension: Secondary | ICD-10-CM | POA: Diagnosis not present

## 2017-05-11 DIAGNOSIS — I482 Chronic atrial fibrillation: Secondary | ICD-10-CM | POA: Diagnosis not present

## 2017-05-11 DIAGNOSIS — E854 Organ-limited amyloidosis: Secondary | ICD-10-CM | POA: Diagnosis not present

## 2017-06-06 DIAGNOSIS — Z9229 Personal history of other drug therapy: Secondary | ICD-10-CM | POA: Insufficient documentation

## 2017-06-06 DIAGNOSIS — Z950 Presence of cardiac pacemaker: Secondary | ICD-10-CM | POA: Insufficient documentation

## 2017-06-15 DIAGNOSIS — Z950 Presence of cardiac pacemaker: Secondary | ICD-10-CM | POA: Diagnosis not present

## 2017-06-29 DIAGNOSIS — J9611 Chronic respiratory failure with hypoxia: Secondary | ICD-10-CM | POA: Diagnosis not present

## 2017-06-29 DIAGNOSIS — E854 Organ-limited amyloidosis: Secondary | ICD-10-CM | POA: Diagnosis not present

## 2017-06-29 DIAGNOSIS — I5030 Unspecified diastolic (congestive) heart failure: Secondary | ICD-10-CM | POA: Diagnosis not present

## 2017-06-29 DIAGNOSIS — I482 Chronic atrial fibrillation: Secondary | ICD-10-CM | POA: Diagnosis not present

## 2017-06-30 DIAGNOSIS — N952 Postmenopausal atrophic vaginitis: Secondary | ICD-10-CM | POA: Diagnosis not present

## 2017-08-13 DIAGNOSIS — E854 Organ-limited amyloidosis: Secondary | ICD-10-CM | POA: Diagnosis not present

## 2017-08-13 DIAGNOSIS — I5030 Unspecified diastolic (congestive) heart failure: Secondary | ICD-10-CM | POA: Diagnosis not present

## 2017-08-13 DIAGNOSIS — I480 Paroxysmal atrial fibrillation: Secondary | ICD-10-CM | POA: Diagnosis not present

## 2017-08-13 DIAGNOSIS — E8581 Light chain (AL) amyloidosis: Secondary | ICD-10-CM | POA: Diagnosis not present

## 2017-08-13 DIAGNOSIS — Z8679 Personal history of other diseases of the circulatory system: Secondary | ICD-10-CM | POA: Diagnosis not present

## 2017-08-13 DIAGNOSIS — I43 Cardiomyopathy in diseases classified elsewhere: Secondary | ICD-10-CM | POA: Diagnosis not present

## 2017-08-13 DIAGNOSIS — Z950 Presence of cardiac pacemaker: Secondary | ICD-10-CM | POA: Diagnosis not present

## 2017-08-31 DIAGNOSIS — Z7901 Long term (current) use of anticoagulants: Secondary | ICD-10-CM | POA: Diagnosis not present

## 2017-08-31 DIAGNOSIS — M546 Pain in thoracic spine: Secondary | ICD-10-CM | POA: Diagnosis not present

## 2017-08-31 DIAGNOSIS — I5032 Chronic diastolic (congestive) heart failure: Secondary | ICD-10-CM | POA: Diagnosis not present

## 2017-08-31 DIAGNOSIS — Z885 Allergy status to narcotic agent status: Secondary | ICD-10-CM | POA: Diagnosis not present

## 2017-08-31 DIAGNOSIS — I4891 Unspecified atrial fibrillation: Secondary | ICD-10-CM | POA: Diagnosis not present

## 2017-08-31 DIAGNOSIS — Z882 Allergy status to sulfonamides status: Secondary | ICD-10-CM | POA: Diagnosis not present

## 2017-08-31 DIAGNOSIS — I43 Cardiomyopathy in diseases classified elsewhere: Secondary | ICD-10-CM | POA: Diagnosis not present

## 2017-08-31 DIAGNOSIS — G8929 Other chronic pain: Secondary | ICD-10-CM | POA: Diagnosis not present

## 2017-08-31 DIAGNOSIS — Z79899 Other long term (current) drug therapy: Secondary | ICD-10-CM | POA: Diagnosis not present

## 2017-08-31 DIAGNOSIS — Z9221 Personal history of antineoplastic chemotherapy: Secondary | ICD-10-CM | POA: Diagnosis not present

## 2017-08-31 DIAGNOSIS — R627 Adult failure to thrive: Secondary | ICD-10-CM | POA: Diagnosis not present

## 2017-08-31 DIAGNOSIS — E854 Organ-limited amyloidosis: Secondary | ICD-10-CM | POA: Diagnosis not present

## 2017-08-31 DIAGNOSIS — I480 Paroxysmal atrial fibrillation: Secondary | ICD-10-CM | POA: Diagnosis not present

## 2017-08-31 DIAGNOSIS — E8581 Light chain (AL) amyloidosis: Secondary | ICD-10-CM | POA: Diagnosis not present

## 2017-08-31 DIAGNOSIS — I5033 Acute on chronic diastolic (congestive) heart failure: Secondary | ICD-10-CM | POA: Insufficient documentation

## 2017-08-31 DIAGNOSIS — R0609 Other forms of dyspnea: Secondary | ICD-10-CM | POA: Diagnosis not present

## 2017-08-31 DIAGNOSIS — I509 Heart failure, unspecified: Secondary | ICD-10-CM | POA: Diagnosis not present

## 2017-09-28 DIAGNOSIS — Z7901 Long term (current) use of anticoagulants: Secondary | ICD-10-CM | POA: Diagnosis not present

## 2017-09-28 DIAGNOSIS — I509 Heart failure, unspecified: Secondary | ICD-10-CM | POA: Diagnosis not present

## 2017-09-28 DIAGNOSIS — Z79899 Other long term (current) drug therapy: Secondary | ICD-10-CM | POA: Diagnosis not present

## 2017-09-28 DIAGNOSIS — I4891 Unspecified atrial fibrillation: Secondary | ICD-10-CM | POA: Diagnosis not present

## 2017-09-28 DIAGNOSIS — Z882 Allergy status to sulfonamides status: Secondary | ICD-10-CM | POA: Diagnosis not present

## 2017-09-28 DIAGNOSIS — Z885 Allergy status to narcotic agent status: Secondary | ICD-10-CM | POA: Diagnosis not present

## 2017-09-28 DIAGNOSIS — Z9221 Personal history of antineoplastic chemotherapy: Secondary | ICD-10-CM | POA: Diagnosis not present

## 2017-09-28 DIAGNOSIS — E8581 Light chain (AL) amyloidosis: Secondary | ICD-10-CM | POA: Diagnosis not present

## 2017-10-05 DIAGNOSIS — I425 Other restrictive cardiomyopathy: Secondary | ICD-10-CM | POA: Diagnosis not present

## 2017-10-05 DIAGNOSIS — E039 Hypothyroidism, unspecified: Secondary | ICD-10-CM | POA: Diagnosis not present

## 2017-10-05 DIAGNOSIS — Z79899 Other long term (current) drug therapy: Secondary | ICD-10-CM | POA: Diagnosis not present

## 2017-10-05 DIAGNOSIS — I442 Atrioventricular block, complete: Secondary | ICD-10-CM | POA: Diagnosis not present

## 2017-10-05 DIAGNOSIS — R5383 Other fatigue: Secondary | ICD-10-CM | POA: Diagnosis not present

## 2017-10-05 DIAGNOSIS — Z45018 Encounter for adjustment and management of other part of cardiac pacemaker: Secondary | ICD-10-CM | POA: Diagnosis not present

## 2017-10-05 DIAGNOSIS — I43 Cardiomyopathy in diseases classified elsewhere: Secondary | ICD-10-CM | POA: Diagnosis not present

## 2017-10-05 DIAGNOSIS — I5032 Chronic diastolic (congestive) heart failure: Secondary | ICD-10-CM | POA: Diagnosis not present

## 2017-10-05 DIAGNOSIS — E854 Organ-limited amyloidosis: Secondary | ICD-10-CM | POA: Diagnosis not present

## 2017-10-05 DIAGNOSIS — Z950 Presence of cardiac pacemaker: Secondary | ICD-10-CM | POA: Diagnosis not present

## 2017-10-05 DIAGNOSIS — E8581 Light chain (AL) amyloidosis: Secondary | ICD-10-CM | POA: Diagnosis not present

## 2017-10-05 DIAGNOSIS — Z7901 Long term (current) use of anticoagulants: Secondary | ICD-10-CM | POA: Diagnosis not present

## 2017-10-05 DIAGNOSIS — I480 Paroxysmal atrial fibrillation: Secondary | ICD-10-CM | POA: Diagnosis not present

## 2017-10-06 DIAGNOSIS — N952 Postmenopausal atrophic vaginitis: Secondary | ICD-10-CM | POA: Diagnosis not present

## 2017-10-19 DIAGNOSIS — I5032 Chronic diastolic (congestive) heart failure: Secondary | ICD-10-CM | POA: Diagnosis not present

## 2017-10-19 DIAGNOSIS — I4891 Unspecified atrial fibrillation: Secondary | ICD-10-CM | POA: Diagnosis not present

## 2017-10-19 DIAGNOSIS — M546 Pain in thoracic spine: Secondary | ICD-10-CM | POA: Diagnosis not present

## 2017-10-19 DIAGNOSIS — Z9221 Personal history of antineoplastic chemotherapy: Secondary | ICD-10-CM | POA: Diagnosis not present

## 2017-10-19 DIAGNOSIS — E8581 Light chain (AL) amyloidosis: Secondary | ICD-10-CM | POA: Diagnosis not present

## 2017-10-19 DIAGNOSIS — G8929 Other chronic pain: Secondary | ICD-10-CM | POA: Diagnosis not present

## 2017-10-19 DIAGNOSIS — C9000 Multiple myeloma not having achieved remission: Secondary | ICD-10-CM | POA: Diagnosis not present

## 2017-10-19 DIAGNOSIS — Z882 Allergy status to sulfonamides status: Secondary | ICD-10-CM | POA: Diagnosis not present

## 2017-10-19 DIAGNOSIS — Z885 Allergy status to narcotic agent status: Secondary | ICD-10-CM | POA: Diagnosis not present

## 2017-10-19 DIAGNOSIS — Z7901 Long term (current) use of anticoagulants: Secondary | ICD-10-CM | POA: Diagnosis not present

## 2017-10-19 DIAGNOSIS — I509 Heart failure, unspecified: Secondary | ICD-10-CM | POA: Diagnosis not present

## 2017-10-19 DIAGNOSIS — I480 Paroxysmal atrial fibrillation: Secondary | ICD-10-CM | POA: Diagnosis not present

## 2017-10-22 DIAGNOSIS — I5030 Unspecified diastolic (congestive) heart failure: Secondary | ICD-10-CM | POA: Diagnosis not present

## 2017-10-22 DIAGNOSIS — E46 Unspecified protein-calorie malnutrition: Secondary | ICD-10-CM | POA: Diagnosis not present

## 2017-10-22 DIAGNOSIS — E854 Organ-limited amyloidosis: Secondary | ICD-10-CM | POA: Diagnosis not present

## 2017-10-22 DIAGNOSIS — I10 Essential (primary) hypertension: Secondary | ICD-10-CM | POA: Diagnosis not present

## 2017-10-26 DIAGNOSIS — I340 Nonrheumatic mitral (valve) insufficiency: Secondary | ICD-10-CM | POA: Diagnosis not present

## 2017-10-26 DIAGNOSIS — E8581 Light chain (AL) amyloidosis: Secondary | ICD-10-CM | POA: Diagnosis not present

## 2017-10-26 DIAGNOSIS — Z7901 Long term (current) use of anticoagulants: Secondary | ICD-10-CM | POA: Diagnosis not present

## 2017-10-26 DIAGNOSIS — Z885 Allergy status to narcotic agent status: Secondary | ICD-10-CM | POA: Diagnosis not present

## 2017-10-26 DIAGNOSIS — C9000 Multiple myeloma not having achieved remission: Secondary | ICD-10-CM | POA: Diagnosis not present

## 2017-10-26 DIAGNOSIS — Z882 Allergy status to sulfonamides status: Secondary | ICD-10-CM | POA: Diagnosis not present

## 2017-10-26 DIAGNOSIS — Z9221 Personal history of antineoplastic chemotherapy: Secondary | ICD-10-CM | POA: Diagnosis not present

## 2017-10-26 DIAGNOSIS — I4891 Unspecified atrial fibrillation: Secondary | ICD-10-CM | POA: Diagnosis not present

## 2017-10-26 DIAGNOSIS — I509 Heart failure, unspecified: Secondary | ICD-10-CM | POA: Diagnosis not present

## 2017-10-26 DIAGNOSIS — Z79899 Other long term (current) drug therapy: Secondary | ICD-10-CM | POA: Diagnosis not present

## 2017-11-01 DIAGNOSIS — E854 Organ-limited amyloidosis: Secondary | ICD-10-CM | POA: Diagnosis not present

## 2017-11-01 DIAGNOSIS — I43 Cardiomyopathy in diseases classified elsewhere: Secondary | ICD-10-CM | POA: Diagnosis not present

## 2017-11-01 DIAGNOSIS — C9000 Multiple myeloma not having achieved remission: Secondary | ICD-10-CM | POA: Diagnosis not present

## 2017-11-01 DIAGNOSIS — I5032 Chronic diastolic (congestive) heart failure: Secondary | ICD-10-CM | POA: Diagnosis not present

## 2017-11-01 DIAGNOSIS — E8581 Light chain (AL) amyloidosis: Secondary | ICD-10-CM | POA: Diagnosis not present

## 2017-11-04 DIAGNOSIS — C9000 Multiple myeloma not having achieved remission: Secondary | ICD-10-CM | POA: Diagnosis present

## 2017-11-04 DIAGNOSIS — I43 Cardiomyopathy in diseases classified elsewhere: Secondary | ICD-10-CM | POA: Diagnosis not present

## 2017-11-04 DIAGNOSIS — I2699 Other pulmonary embolism without acute cor pulmonale: Secondary | ICD-10-CM | POA: Diagnosis not present

## 2017-11-04 DIAGNOSIS — Z950 Presence of cardiac pacemaker: Secondary | ICD-10-CM | POA: Diagnosis not present

## 2017-11-04 DIAGNOSIS — K5190 Ulcerative colitis, unspecified, without complications: Secondary | ICD-10-CM | POA: Diagnosis present

## 2017-11-04 DIAGNOSIS — Z7901 Long term (current) use of anticoagulants: Secondary | ICD-10-CM | POA: Diagnosis not present

## 2017-11-04 DIAGNOSIS — I484 Atypical atrial flutter: Secondary | ICD-10-CM | POA: Diagnosis not present

## 2017-11-04 DIAGNOSIS — I340 Nonrheumatic mitral (valve) insufficiency: Secondary | ICD-10-CM | POA: Diagnosis present

## 2017-11-04 DIAGNOSIS — R51 Headache: Secondary | ICD-10-CM | POA: Diagnosis not present

## 2017-11-04 DIAGNOSIS — E039 Hypothyroidism, unspecified: Secondary | ICD-10-CM | POA: Diagnosis present

## 2017-11-04 DIAGNOSIS — J849 Interstitial pulmonary disease, unspecified: Secondary | ICD-10-CM | POA: Diagnosis not present

## 2017-11-04 DIAGNOSIS — I441 Atrioventricular block, second degree: Secondary | ICD-10-CM | POA: Diagnosis not present

## 2017-11-04 DIAGNOSIS — I5032 Chronic diastolic (congestive) heart failure: Secondary | ICD-10-CM | POA: Diagnosis not present

## 2017-11-04 DIAGNOSIS — E871 Hypo-osmolality and hyponatremia: Secondary | ICD-10-CM | POA: Diagnosis not present

## 2017-11-04 DIAGNOSIS — R0789 Other chest pain: Secondary | ICD-10-CM | POA: Diagnosis not present

## 2017-11-04 DIAGNOSIS — I4892 Unspecified atrial flutter: Secondary | ICD-10-CM | POA: Diagnosis not present

## 2017-11-04 DIAGNOSIS — M542 Cervicalgia: Secondary | ICD-10-CM | POA: Diagnosis not present

## 2017-11-04 DIAGNOSIS — I4581 Long QT syndrome: Secondary | ICD-10-CM | POA: Diagnosis present

## 2017-11-04 DIAGNOSIS — G893 Neoplasm related pain (acute) (chronic): Secondary | ICD-10-CM | POA: Diagnosis present

## 2017-11-04 DIAGNOSIS — K219 Gastro-esophageal reflux disease without esophagitis: Secondary | ICD-10-CM | POA: Diagnosis present

## 2017-11-04 DIAGNOSIS — I509 Heart failure, unspecified: Secondary | ICD-10-CM | POA: Diagnosis not present

## 2017-11-04 DIAGNOSIS — R0602 Shortness of breath: Secondary | ICD-10-CM | POA: Diagnosis not present

## 2017-11-04 DIAGNOSIS — E854 Organ-limited amyloidosis: Secondary | ICD-10-CM | POA: Diagnosis not present

## 2017-11-04 DIAGNOSIS — R918 Other nonspecific abnormal finding of lung field: Secondary | ICD-10-CM | POA: Diagnosis not present

## 2017-11-04 DIAGNOSIS — I480 Paroxysmal atrial fibrillation: Secondary | ICD-10-CM | POA: Diagnosis not present

## 2017-11-17 DIAGNOSIS — E8581 Light chain (AL) amyloidosis: Secondary | ICD-10-CM | POA: Diagnosis not present

## 2017-11-17 DIAGNOSIS — C9000 Multiple myeloma not having achieved remission: Secondary | ICD-10-CM | POA: Diagnosis not present

## 2017-11-17 DIAGNOSIS — I4892 Unspecified atrial flutter: Secondary | ICD-10-CM | POA: Diagnosis not present

## 2017-11-17 DIAGNOSIS — I5032 Chronic diastolic (congestive) heart failure: Secondary | ICD-10-CM | POA: Diagnosis not present

## 2017-11-17 DIAGNOSIS — Z9221 Personal history of antineoplastic chemotherapy: Secondary | ICD-10-CM | POA: Diagnosis not present

## 2017-11-17 DIAGNOSIS — I509 Heart failure, unspecified: Secondary | ICD-10-CM | POA: Diagnosis not present

## 2017-11-17 DIAGNOSIS — Z885 Allergy status to narcotic agent status: Secondary | ICD-10-CM | POA: Diagnosis not present

## 2017-11-17 DIAGNOSIS — Z006 Encounter for examination for normal comparison and control in clinical research program: Secondary | ICD-10-CM | POA: Diagnosis not present

## 2017-11-17 DIAGNOSIS — Z882 Allergy status to sulfonamides status: Secondary | ICD-10-CM | POA: Diagnosis not present

## 2017-11-19 ENCOUNTER — Other Ambulatory Visit: Payer: Self-pay

## 2017-11-29 DIAGNOSIS — Z950 Presence of cardiac pacemaker: Secondary | ICD-10-CM | POA: Diagnosis not present

## 2017-11-29 DIAGNOSIS — I480 Paroxysmal atrial fibrillation: Secondary | ICD-10-CM | POA: Diagnosis not present

## 2017-11-29 DIAGNOSIS — I081 Rheumatic disorders of both mitral and tricuspid valves: Secondary | ICD-10-CM | POA: Diagnosis not present

## 2017-11-29 DIAGNOSIS — I340 Nonrheumatic mitral (valve) insufficiency: Secondary | ICD-10-CM | POA: Diagnosis not present

## 2017-11-29 DIAGNOSIS — Z79899 Other long term (current) drug therapy: Secondary | ICD-10-CM | POA: Diagnosis not present

## 2017-11-29 DIAGNOSIS — I252 Old myocardial infarction: Secondary | ICD-10-CM | POA: Diagnosis not present

## 2017-11-29 DIAGNOSIS — I444 Left anterior fascicular block: Secondary | ICD-10-CM | POA: Diagnosis not present

## 2017-11-29 DIAGNOSIS — I484 Atypical atrial flutter: Secondary | ICD-10-CM | POA: Diagnosis not present

## 2017-11-29 DIAGNOSIS — R5383 Other fatigue: Secondary | ICD-10-CM | POA: Diagnosis not present

## 2017-11-29 DIAGNOSIS — I4581 Long QT syndrome: Secondary | ICD-10-CM | POA: Diagnosis not present

## 2017-11-29 DIAGNOSIS — Z7901 Long term (current) use of anticoagulants: Secondary | ICD-10-CM | POA: Diagnosis not present

## 2017-11-29 DIAGNOSIS — I440 Atrioventricular block, first degree: Secondary | ICD-10-CM | POA: Diagnosis not present

## 2017-12-27 DIAGNOSIS — R8271 Bacteriuria: Secondary | ICD-10-CM | POA: Diagnosis not present

## 2017-12-27 DIAGNOSIS — N952 Postmenopausal atrophic vaginitis: Secondary | ICD-10-CM | POA: Diagnosis not present

## 2018-01-04 DIAGNOSIS — E854 Organ-limited amyloidosis: Secondary | ICD-10-CM | POA: Diagnosis not present

## 2018-01-04 DIAGNOSIS — I480 Paroxysmal atrial fibrillation: Secondary | ICD-10-CM | POA: Diagnosis not present

## 2018-01-04 DIAGNOSIS — I340 Nonrheumatic mitral (valve) insufficiency: Secondary | ICD-10-CM | POA: Diagnosis not present

## 2018-01-04 DIAGNOSIS — R0602 Shortness of breath: Secondary | ICD-10-CM | POA: Diagnosis not present

## 2018-01-04 DIAGNOSIS — Z950 Presence of cardiac pacemaker: Secondary | ICD-10-CM | POA: Diagnosis not present

## 2018-01-04 DIAGNOSIS — I5032 Chronic diastolic (congestive) heart failure: Secondary | ICD-10-CM | POA: Diagnosis not present

## 2018-01-04 DIAGNOSIS — R42 Dizziness and giddiness: Secondary | ICD-10-CM | POA: Diagnosis not present

## 2018-01-04 DIAGNOSIS — I43 Cardiomyopathy in diseases classified elsewhere: Secondary | ICD-10-CM | POA: Diagnosis not present

## 2018-01-19 DIAGNOSIS — Z79899 Other long term (current) drug therapy: Secondary | ICD-10-CM | POA: Diagnosis not present

## 2018-01-19 DIAGNOSIS — Z885 Allergy status to narcotic agent status: Secondary | ICD-10-CM | POA: Diagnosis not present

## 2018-01-19 DIAGNOSIS — Z882 Allergy status to sulfonamides status: Secondary | ICD-10-CM | POA: Diagnosis not present

## 2018-01-19 DIAGNOSIS — I43 Cardiomyopathy in diseases classified elsewhere: Secondary | ICD-10-CM | POA: Diagnosis not present

## 2018-01-19 DIAGNOSIS — E8581 Light chain (AL) amyloidosis: Secondary | ICD-10-CM | POA: Diagnosis not present

## 2018-01-19 DIAGNOSIS — Z7901 Long term (current) use of anticoagulants: Secondary | ICD-10-CM | POA: Diagnosis not present

## 2018-01-19 DIAGNOSIS — I480 Paroxysmal atrial fibrillation: Secondary | ICD-10-CM | POA: Diagnosis not present

## 2018-01-19 DIAGNOSIS — Z9221 Personal history of antineoplastic chemotherapy: Secondary | ICD-10-CM | POA: Diagnosis not present

## 2018-01-19 DIAGNOSIS — I5032 Chronic diastolic (congestive) heart failure: Secondary | ICD-10-CM | POA: Diagnosis not present

## 2018-01-19 DIAGNOSIS — E854 Organ-limited amyloidosis: Secondary | ICD-10-CM | POA: Diagnosis not present

## 2018-01-19 DIAGNOSIS — I4892 Unspecified atrial flutter: Secondary | ICD-10-CM | POA: Diagnosis not present

## 2018-01-26 DIAGNOSIS — Z23 Encounter for immunization: Secondary | ICD-10-CM | POA: Diagnosis not present

## 2018-01-31 DIAGNOSIS — I700 Atherosclerosis of aorta: Secondary | ICD-10-CM | POA: Diagnosis not present

## 2018-01-31 DIAGNOSIS — R6889 Other general symptoms and signs: Secondary | ICD-10-CM | POA: Diagnosis not present

## 2018-01-31 DIAGNOSIS — Z006 Encounter for examination for normal comparison and control in clinical research program: Secondary | ICD-10-CM | POA: Diagnosis not present

## 2018-01-31 DIAGNOSIS — I071 Rheumatic tricuspid insufficiency: Secondary | ICD-10-CM | POA: Diagnosis not present

## 2018-01-31 DIAGNOSIS — I5032 Chronic diastolic (congestive) heart failure: Secondary | ICD-10-CM | POA: Diagnosis not present

## 2018-01-31 DIAGNOSIS — E8581 Light chain (AL) amyloidosis: Secondary | ICD-10-CM | POA: Diagnosis not present

## 2018-02-08 DIAGNOSIS — E859 Amyloidosis, unspecified: Secondary | ICD-10-CM | POA: Diagnosis not present

## 2018-02-08 DIAGNOSIS — I5032 Chronic diastolic (congestive) heart failure: Secondary | ICD-10-CM | POA: Diagnosis not present

## 2018-02-08 DIAGNOSIS — Z79899 Other long term (current) drug therapy: Secondary | ICD-10-CM | POA: Diagnosis not present

## 2018-02-08 DIAGNOSIS — E8581 Light chain (AL) amyloidosis: Secondary | ICD-10-CM | POA: Diagnosis not present

## 2018-02-08 DIAGNOSIS — I252 Old myocardial infarction: Secondary | ICD-10-CM | POA: Diagnosis not present

## 2018-02-08 DIAGNOSIS — Z006 Encounter for examination for normal comparison and control in clinical research program: Secondary | ICD-10-CM | POA: Diagnosis not present

## 2018-02-15 DIAGNOSIS — Z006 Encounter for examination for normal comparison and control in clinical research program: Secondary | ICD-10-CM | POA: Diagnosis not present

## 2018-02-15 DIAGNOSIS — E854 Organ-limited amyloidosis: Secondary | ICD-10-CM | POA: Diagnosis not present

## 2018-02-15 DIAGNOSIS — I43 Cardiomyopathy in diseases classified elsewhere: Secondary | ICD-10-CM | POA: Diagnosis not present

## 2018-02-22 DIAGNOSIS — E8581 Light chain (AL) amyloidosis: Secondary | ICD-10-CM | POA: Diagnosis not present

## 2018-02-22 DIAGNOSIS — Z006 Encounter for examination for normal comparison and control in clinical research program: Secondary | ICD-10-CM | POA: Diagnosis not present

## 2018-03-01 DIAGNOSIS — E8581 Light chain (AL) amyloidosis: Secondary | ICD-10-CM | POA: Diagnosis not present

## 2018-03-01 DIAGNOSIS — Z006 Encounter for examination for normal comparison and control in clinical research program: Secondary | ICD-10-CM | POA: Diagnosis not present

## 2018-03-09 DIAGNOSIS — E8581 Light chain (AL) amyloidosis: Secondary | ICD-10-CM | POA: Diagnosis not present

## 2018-03-09 DIAGNOSIS — Z79899 Other long term (current) drug therapy: Secondary | ICD-10-CM | POA: Diagnosis not present

## 2018-03-09 DIAGNOSIS — Z885 Allergy status to narcotic agent status: Secondary | ICD-10-CM | POA: Diagnosis not present

## 2018-03-09 DIAGNOSIS — I480 Paroxysmal atrial fibrillation: Secondary | ICD-10-CM | POA: Diagnosis not present

## 2018-03-09 DIAGNOSIS — Z006 Encounter for examination for normal comparison and control in clinical research program: Secondary | ICD-10-CM | POA: Diagnosis not present

## 2018-03-09 DIAGNOSIS — I509 Heart failure, unspecified: Secondary | ICD-10-CM | POA: Diagnosis not present

## 2018-03-09 DIAGNOSIS — Z7901 Long term (current) use of anticoagulants: Secondary | ICD-10-CM | POA: Diagnosis not present

## 2018-03-09 DIAGNOSIS — I5032 Chronic diastolic (congestive) heart failure: Secondary | ICD-10-CM | POA: Diagnosis not present

## 2018-03-09 DIAGNOSIS — I4892 Unspecified atrial flutter: Secondary | ICD-10-CM | POA: Diagnosis not present

## 2018-03-22 DIAGNOSIS — Z006 Encounter for examination for normal comparison and control in clinical research program: Secondary | ICD-10-CM | POA: Diagnosis not present

## 2018-03-22 DIAGNOSIS — E8581 Light chain (AL) amyloidosis: Secondary | ICD-10-CM | POA: Diagnosis not present

## 2018-03-24 DIAGNOSIS — Z961 Presence of intraocular lens: Secondary | ICD-10-CM | POA: Diagnosis not present

## 2018-04-05 DIAGNOSIS — I509 Heart failure, unspecified: Secondary | ICD-10-CM | POA: Diagnosis not present

## 2018-04-05 DIAGNOSIS — Z79899 Other long term (current) drug therapy: Secondary | ICD-10-CM | POA: Diagnosis not present

## 2018-04-05 DIAGNOSIS — I480 Paroxysmal atrial fibrillation: Secondary | ICD-10-CM | POA: Diagnosis not present

## 2018-04-05 DIAGNOSIS — I5032 Chronic diastolic (congestive) heart failure: Secondary | ICD-10-CM | POA: Diagnosis not present

## 2018-04-05 DIAGNOSIS — Z45018 Encounter for adjustment and management of other part of cardiac pacemaker: Secondary | ICD-10-CM | POA: Diagnosis not present

## 2018-04-05 DIAGNOSIS — Z7901 Long term (current) use of anticoagulants: Secondary | ICD-10-CM | POA: Diagnosis not present

## 2018-04-05 DIAGNOSIS — I4892 Unspecified atrial flutter: Secondary | ICD-10-CM | POA: Diagnosis not present

## 2018-04-05 DIAGNOSIS — I471 Supraventricular tachycardia: Secondary | ICD-10-CM | POA: Diagnosis not present

## 2018-04-05 DIAGNOSIS — Z950 Presence of cardiac pacemaker: Secondary | ICD-10-CM | POA: Diagnosis not present

## 2018-04-05 DIAGNOSIS — Z5111 Encounter for antineoplastic chemotherapy: Secondary | ICD-10-CM | POA: Diagnosis not present

## 2018-04-05 DIAGNOSIS — E8581 Light chain (AL) amyloidosis: Secondary | ICD-10-CM | POA: Diagnosis not present

## 2018-04-05 DIAGNOSIS — Z006 Encounter for examination for normal comparison and control in clinical research program: Secondary | ICD-10-CM | POA: Diagnosis not present

## 2018-04-11 DIAGNOSIS — J029 Acute pharyngitis, unspecified: Secondary | ICD-10-CM | POA: Diagnosis not present

## 2018-04-16 DIAGNOSIS — I517 Cardiomegaly: Secondary | ICD-10-CM | POA: Diagnosis not present

## 2018-04-16 DIAGNOSIS — Z79899 Other long term (current) drug therapy: Secondary | ICD-10-CM | POA: Diagnosis not present

## 2018-04-16 DIAGNOSIS — K5190 Ulcerative colitis, unspecified, without complications: Secondary | ICD-10-CM | POA: Diagnosis not present

## 2018-04-16 DIAGNOSIS — I50810 Right heart failure, unspecified: Secondary | ICD-10-CM | POA: Diagnosis not present

## 2018-04-16 DIAGNOSIS — E859 Amyloidosis, unspecified: Secondary | ICD-10-CM | POA: Diagnosis not present

## 2018-04-16 DIAGNOSIS — Z950 Presence of cardiac pacemaker: Secondary | ICD-10-CM | POA: Diagnosis not present

## 2018-04-16 DIAGNOSIS — Z885 Allergy status to narcotic agent status: Secondary | ICD-10-CM | POA: Diagnosis not present

## 2018-04-16 DIAGNOSIS — E039 Hypothyroidism, unspecified: Secondary | ICD-10-CM | POA: Diagnosis not present

## 2018-04-16 DIAGNOSIS — I4581 Long QT syndrome: Secondary | ICD-10-CM | POA: Diagnosis not present

## 2018-04-16 DIAGNOSIS — Z7901 Long term (current) use of anticoagulants: Secondary | ICD-10-CM | POA: Diagnosis not present

## 2018-04-16 DIAGNOSIS — R918 Other nonspecific abnormal finding of lung field: Secondary | ICD-10-CM | POA: Diagnosis not present

## 2018-04-16 DIAGNOSIS — E8581 Light chain (AL) amyloidosis: Secondary | ICD-10-CM | POA: Diagnosis not present

## 2018-04-16 DIAGNOSIS — I2693 Single subsegmental pulmonary embolism without acute cor pulmonale: Secondary | ICD-10-CM | POA: Diagnosis not present

## 2018-04-16 DIAGNOSIS — I5032 Chronic diastolic (congestive) heart failure: Secondary | ICD-10-CM | POA: Diagnosis not present

## 2018-04-16 DIAGNOSIS — I444 Left anterior fascicular block: Secondary | ICD-10-CM | POA: Diagnosis not present

## 2018-04-16 DIAGNOSIS — N1330 Unspecified hydronephrosis: Secondary | ICD-10-CM | POA: Diagnosis not present

## 2018-04-16 DIAGNOSIS — Z882 Allergy status to sulfonamides status: Secondary | ICD-10-CM | POA: Diagnosis not present

## 2018-04-16 DIAGNOSIS — J849 Interstitial pulmonary disease, unspecified: Secondary | ICD-10-CM | POA: Diagnosis not present

## 2018-04-16 DIAGNOSIS — I480 Paroxysmal atrial fibrillation: Secondary | ICD-10-CM | POA: Diagnosis not present

## 2018-04-16 DIAGNOSIS — R0602 Shortness of breath: Secondary | ICD-10-CM | POA: Diagnosis not present

## 2018-04-16 DIAGNOSIS — J189 Pneumonia, unspecified organism: Secondary | ICD-10-CM | POA: Diagnosis not present

## 2018-04-16 DIAGNOSIS — I509 Heart failure, unspecified: Secondary | ICD-10-CM | POA: Diagnosis not present

## 2018-04-17 DIAGNOSIS — R0602 Shortness of breath: Secondary | ICD-10-CM | POA: Diagnosis not present

## 2018-04-17 DIAGNOSIS — J90 Pleural effusion, not elsewhere classified: Secondary | ICD-10-CM | POA: Diagnosis not present

## 2018-04-17 DIAGNOSIS — I5030 Unspecified diastolic (congestive) heart failure: Secondary | ICD-10-CM | POA: Diagnosis not present

## 2018-04-17 DIAGNOSIS — N1330 Unspecified hydronephrosis: Secondary | ICD-10-CM | POA: Diagnosis not present

## 2018-04-17 DIAGNOSIS — E8581 Light chain (AL) amyloidosis: Secondary | ICD-10-CM | POA: Diagnosis not present

## 2018-04-17 DIAGNOSIS — J849 Interstitial pulmonary disease, unspecified: Secondary | ICD-10-CM | POA: Diagnosis not present

## 2018-04-18 DIAGNOSIS — I482 Chronic atrial fibrillation, unspecified: Secondary | ICD-10-CM | POA: Diagnosis not present

## 2018-04-18 DIAGNOSIS — I5030 Unspecified diastolic (congestive) heart failure: Secondary | ICD-10-CM | POA: Diagnosis not present

## 2018-04-18 DIAGNOSIS — J9611 Chronic respiratory failure with hypoxia: Secondary | ICD-10-CM | POA: Diagnosis not present

## 2018-04-18 DIAGNOSIS — E854 Organ-limited amyloidosis: Secondary | ICD-10-CM | POA: Diagnosis not present

## 2018-04-21 DIAGNOSIS — N952 Postmenopausal atrophic vaginitis: Secondary | ICD-10-CM | POA: Diagnosis not present

## 2018-04-25 DIAGNOSIS — E854 Organ-limited amyloidosis: Secondary | ICD-10-CM | POA: Diagnosis not present

## 2018-04-25 DIAGNOSIS — I5030 Unspecified diastolic (congestive) heart failure: Secondary | ICD-10-CM | POA: Diagnosis not present

## 2018-04-25 DIAGNOSIS — I482 Chronic atrial fibrillation, unspecified: Secondary | ICD-10-CM | POA: Diagnosis not present

## 2018-05-03 DIAGNOSIS — Z006 Encounter for examination for normal comparison and control in clinical research program: Secondary | ICD-10-CM | POA: Diagnosis not present

## 2018-05-03 DIAGNOSIS — I509 Heart failure, unspecified: Secondary | ICD-10-CM | POA: Diagnosis not present

## 2018-05-03 DIAGNOSIS — Z888 Allergy status to other drugs, medicaments and biological substances status: Secondary | ICD-10-CM | POA: Diagnosis not present

## 2018-05-03 DIAGNOSIS — Z5111 Encounter for antineoplastic chemotherapy: Secondary | ICD-10-CM | POA: Diagnosis not present

## 2018-05-03 DIAGNOSIS — Z885 Allergy status to narcotic agent status: Secondary | ICD-10-CM | POA: Diagnosis not present

## 2018-05-03 DIAGNOSIS — I5032 Chronic diastolic (congestive) heart failure: Secondary | ICD-10-CM | POA: Diagnosis not present

## 2018-05-03 DIAGNOSIS — Z882 Allergy status to sulfonamides status: Secondary | ICD-10-CM | POA: Diagnosis not present

## 2018-05-03 DIAGNOSIS — Z79899 Other long term (current) drug therapy: Secondary | ICD-10-CM | POA: Diagnosis not present

## 2018-05-03 DIAGNOSIS — I517 Cardiomegaly: Secondary | ICD-10-CM | POA: Diagnosis not present

## 2018-05-03 DIAGNOSIS — Z7901 Long term (current) use of anticoagulants: Secondary | ICD-10-CM | POA: Diagnosis not present

## 2018-05-03 DIAGNOSIS — I340 Nonrheumatic mitral (valve) insufficiency: Secondary | ICD-10-CM | POA: Diagnosis not present

## 2018-05-03 DIAGNOSIS — I700 Atherosclerosis of aorta: Secondary | ICD-10-CM | POA: Diagnosis not present

## 2018-05-03 DIAGNOSIS — E8581 Light chain (AL) amyloidosis: Secondary | ICD-10-CM | POA: Diagnosis not present

## 2018-05-03 DIAGNOSIS — I4892 Unspecified atrial flutter: Secondary | ICD-10-CM | POA: Diagnosis not present

## 2018-05-03 DIAGNOSIS — R5383 Other fatigue: Secondary | ICD-10-CM | POA: Diagnosis not present

## 2018-05-19 DIAGNOSIS — E8581 Light chain (AL) amyloidosis: Secondary | ICD-10-CM | POA: Diagnosis not present

## 2018-05-19 DIAGNOSIS — D4989 Neoplasm of unspecified behavior of other specified sites: Secondary | ICD-10-CM | POA: Diagnosis not present

## 2018-05-19 DIAGNOSIS — Z006 Encounter for examination for normal comparison and control in clinical research program: Secondary | ICD-10-CM | POA: Diagnosis not present

## 2018-05-31 DIAGNOSIS — I4892 Unspecified atrial flutter: Secondary | ICD-10-CM | POA: Diagnosis not present

## 2018-05-31 DIAGNOSIS — Z7901 Long term (current) use of anticoagulants: Secondary | ICD-10-CM | POA: Diagnosis not present

## 2018-05-31 DIAGNOSIS — E8581 Light chain (AL) amyloidosis: Secondary | ICD-10-CM | POA: Diagnosis not present

## 2018-05-31 DIAGNOSIS — Z006 Encounter for examination for normal comparison and control in clinical research program: Secondary | ICD-10-CM | POA: Diagnosis not present

## 2018-05-31 DIAGNOSIS — I509 Heart failure, unspecified: Secondary | ICD-10-CM | POA: Diagnosis not present

## 2018-05-31 DIAGNOSIS — Z885 Allergy status to narcotic agent status: Secondary | ICD-10-CM | POA: Diagnosis not present

## 2018-05-31 DIAGNOSIS — Z5112 Encounter for antineoplastic immunotherapy: Secondary | ICD-10-CM | POA: Diagnosis not present

## 2018-05-31 DIAGNOSIS — R5383 Other fatigue: Secondary | ICD-10-CM | POA: Diagnosis not present

## 2018-05-31 DIAGNOSIS — Z882 Allergy status to sulfonamides status: Secondary | ICD-10-CM | POA: Diagnosis not present

## 2018-06-03 DIAGNOSIS — I5030 Unspecified diastolic (congestive) heart failure: Secondary | ICD-10-CM | POA: Diagnosis not present

## 2018-06-03 DIAGNOSIS — T50905A Adverse effect of unspecified drugs, medicaments and biological substances, initial encounter: Secondary | ICD-10-CM | POA: Diagnosis not present

## 2018-06-03 DIAGNOSIS — I10 Essential (primary) hypertension: Secondary | ICD-10-CM | POA: Diagnosis not present

## 2018-06-03 DIAGNOSIS — I482 Chronic atrial fibrillation, unspecified: Secondary | ICD-10-CM | POA: Diagnosis not present

## 2018-06-08 DIAGNOSIS — I425 Other restrictive cardiomyopathy: Secondary | ICD-10-CM | POA: Diagnosis not present

## 2018-06-08 DIAGNOSIS — E8581 Light chain (AL) amyloidosis: Secondary | ICD-10-CM | POA: Diagnosis not present

## 2018-06-08 DIAGNOSIS — I5030 Unspecified diastolic (congestive) heart failure: Secondary | ICD-10-CM | POA: Diagnosis not present

## 2018-06-14 DIAGNOSIS — E8581 Light chain (AL) amyloidosis: Secondary | ICD-10-CM | POA: Diagnosis not present

## 2018-06-14 DIAGNOSIS — Z006 Encounter for examination for normal comparison and control in clinical research program: Secondary | ICD-10-CM | POA: Diagnosis not present

## 2018-06-15 DIAGNOSIS — E854 Organ-limited amyloidosis: Secondary | ICD-10-CM | POA: Diagnosis not present

## 2018-06-15 DIAGNOSIS — J9611 Chronic respiratory failure with hypoxia: Secondary | ICD-10-CM | POA: Diagnosis not present

## 2018-06-15 DIAGNOSIS — J441 Chronic obstructive pulmonary disease with (acute) exacerbation: Secondary | ICD-10-CM | POA: Diagnosis not present

## 2018-06-15 DIAGNOSIS — I5030 Unspecified diastolic (congestive) heart failure: Secondary | ICD-10-CM | POA: Diagnosis not present

## 2018-06-29 DIAGNOSIS — Z79899 Other long term (current) drug therapy: Secondary | ICD-10-CM | POA: Diagnosis not present

## 2018-06-29 DIAGNOSIS — E8581 Light chain (AL) amyloidosis: Secondary | ICD-10-CM | POA: Diagnosis not present

## 2018-06-29 DIAGNOSIS — Z5112 Encounter for antineoplastic immunotherapy: Secondary | ICD-10-CM | POA: Diagnosis not present

## 2018-06-29 DIAGNOSIS — Z882 Allergy status to sulfonamides status: Secondary | ICD-10-CM | POA: Diagnosis not present

## 2018-06-29 DIAGNOSIS — R5383 Other fatigue: Secondary | ICD-10-CM | POA: Diagnosis not present

## 2018-06-29 DIAGNOSIS — Z006 Encounter for examination for normal comparison and control in clinical research program: Secondary | ICD-10-CM | POA: Diagnosis not present

## 2018-06-29 DIAGNOSIS — I4892 Unspecified atrial flutter: Secondary | ICD-10-CM | POA: Diagnosis not present

## 2018-06-29 DIAGNOSIS — Z885 Allergy status to narcotic agent status: Secondary | ICD-10-CM | POA: Diagnosis not present

## 2018-06-29 DIAGNOSIS — I509 Heart failure, unspecified: Secondary | ICD-10-CM | POA: Diagnosis not present

## 2018-07-05 DIAGNOSIS — I480 Paroxysmal atrial fibrillation: Secondary | ICD-10-CM | POA: Diagnosis not present

## 2018-07-05 DIAGNOSIS — Z950 Presence of cardiac pacemaker: Secondary | ICD-10-CM | POA: Diagnosis not present

## 2018-07-05 DIAGNOSIS — Z45018 Encounter for adjustment and management of other part of cardiac pacemaker: Secondary | ICD-10-CM | POA: Diagnosis not present

## 2018-07-25 DIAGNOSIS — I4892 Unspecified atrial flutter: Secondary | ICD-10-CM | POA: Diagnosis not present

## 2018-07-25 DIAGNOSIS — I5032 Chronic diastolic (congestive) heart failure: Secondary | ICD-10-CM | POA: Diagnosis not present

## 2018-07-25 DIAGNOSIS — Z006 Encounter for examination for normal comparison and control in clinical research program: Secondary | ICD-10-CM | POA: Diagnosis not present

## 2018-07-25 DIAGNOSIS — Z7901 Long term (current) use of anticoagulants: Secondary | ICD-10-CM | POA: Diagnosis not present

## 2018-07-25 DIAGNOSIS — E8581 Light chain (AL) amyloidosis: Secondary | ICD-10-CM | POA: Diagnosis not present

## 2018-08-09 DIAGNOSIS — E8581 Light chain (AL) amyloidosis: Secondary | ICD-10-CM | POA: Diagnosis not present

## 2018-08-09 DIAGNOSIS — Z006 Encounter for examination for normal comparison and control in clinical research program: Secondary | ICD-10-CM | POA: Diagnosis not present

## 2018-08-22 DIAGNOSIS — I5032 Chronic diastolic (congestive) heart failure: Secondary | ICD-10-CM | POA: Diagnosis not present

## 2018-08-22 DIAGNOSIS — Z006 Encounter for examination for normal comparison and control in clinical research program: Secondary | ICD-10-CM | POA: Diagnosis not present

## 2018-08-22 DIAGNOSIS — I4892 Unspecified atrial flutter: Secondary | ICD-10-CM | POA: Diagnosis not present

## 2018-08-22 DIAGNOSIS — E8581 Light chain (AL) amyloidosis: Secondary | ICD-10-CM | POA: Diagnosis not present

## 2018-08-23 DIAGNOSIS — E8581 Light chain (AL) amyloidosis: Secondary | ICD-10-CM | POA: Diagnosis not present

## 2018-08-23 DIAGNOSIS — Z006 Encounter for examination for normal comparison and control in clinical research program: Secondary | ICD-10-CM | POA: Diagnosis not present

## 2018-08-31 DIAGNOSIS — N952 Postmenopausal atrophic vaginitis: Secondary | ICD-10-CM | POA: Diagnosis not present

## 2018-08-31 DIAGNOSIS — N3020 Other chronic cystitis without hematuria: Secondary | ICD-10-CM | POA: Diagnosis not present

## 2018-09-06 DIAGNOSIS — Z5111 Encounter for antineoplastic chemotherapy: Secondary | ICD-10-CM | POA: Diagnosis not present

## 2018-09-06 DIAGNOSIS — I361 Nonrheumatic tricuspid (valve) insufficiency: Secondary | ICD-10-CM | POA: Diagnosis not present

## 2018-09-06 DIAGNOSIS — E8581 Light chain (AL) amyloidosis: Secondary | ICD-10-CM | POA: Diagnosis not present

## 2018-09-06 DIAGNOSIS — I700 Atherosclerosis of aorta: Secondary | ICD-10-CM | POA: Diagnosis not present

## 2018-09-06 DIAGNOSIS — I340 Nonrheumatic mitral (valve) insufficiency: Secondary | ICD-10-CM | POA: Diagnosis not present

## 2018-09-06 DIAGNOSIS — Z006 Encounter for examination for normal comparison and control in clinical research program: Secondary | ICD-10-CM | POA: Diagnosis not present

## 2018-09-06 DIAGNOSIS — I517 Cardiomegaly: Secondary | ICD-10-CM | POA: Diagnosis not present

## 2018-09-06 DIAGNOSIS — I272 Pulmonary hypertension, unspecified: Secondary | ICD-10-CM | POA: Diagnosis not present

## 2018-09-19 DIAGNOSIS — I5032 Chronic diastolic (congestive) heart failure: Secondary | ICD-10-CM | POA: Diagnosis not present

## 2018-09-19 DIAGNOSIS — E8581 Light chain (AL) amyloidosis: Secondary | ICD-10-CM | POA: Diagnosis not present

## 2018-09-20 DIAGNOSIS — R0609 Other forms of dyspnea: Secondary | ICD-10-CM | POA: Diagnosis not present

## 2018-09-20 DIAGNOSIS — E8581 Light chain (AL) amyloidosis: Secondary | ICD-10-CM | POA: Diagnosis not present

## 2018-09-20 DIAGNOSIS — Z006 Encounter for examination for normal comparison and control in clinical research program: Secondary | ICD-10-CM | POA: Diagnosis not present

## 2018-09-26 DIAGNOSIS — H353221 Exudative age-related macular degeneration, left eye, with active choroidal neovascularization: Secondary | ICD-10-CM | POA: Diagnosis not present

## 2018-09-26 DIAGNOSIS — H1132 Conjunctival hemorrhage, left eye: Secondary | ICD-10-CM | POA: Diagnosis not present

## 2018-09-26 DIAGNOSIS — H43812 Vitreous degeneration, left eye: Secondary | ICD-10-CM | POA: Diagnosis not present

## 2018-09-26 DIAGNOSIS — H3562 Retinal hemorrhage, left eye: Secondary | ICD-10-CM | POA: Diagnosis not present

## 2018-09-26 DIAGNOSIS — H353111 Nonexudative age-related macular degeneration, right eye, early dry stage: Secondary | ICD-10-CM | POA: Diagnosis not present

## 2018-09-26 DIAGNOSIS — H353220 Exudative age-related macular degeneration, left eye, stage unspecified: Secondary | ICD-10-CM | POA: Diagnosis not present

## 2018-09-26 DIAGNOSIS — H353132 Nonexudative age-related macular degeneration, bilateral, intermediate dry stage: Secondary | ICD-10-CM | POA: Diagnosis not present

## 2018-10-04 DIAGNOSIS — E8581 Light chain (AL) amyloidosis: Secondary | ICD-10-CM | POA: Diagnosis not present

## 2018-10-04 DIAGNOSIS — Z006 Encounter for examination for normal comparison and control in clinical research program: Secondary | ICD-10-CM | POA: Diagnosis not present

## 2018-10-04 DIAGNOSIS — R0609 Other forms of dyspnea: Secondary | ICD-10-CM | POA: Diagnosis not present

## 2018-10-05 DIAGNOSIS — I484 Atypical atrial flutter: Secondary | ICD-10-CM | POA: Diagnosis not present

## 2018-10-05 DIAGNOSIS — Z950 Presence of cardiac pacemaker: Secondary | ICD-10-CM | POA: Diagnosis not present

## 2018-10-05 DIAGNOSIS — Z45018 Encounter for adjustment and management of other part of cardiac pacemaker: Secondary | ICD-10-CM | POA: Diagnosis not present

## 2018-10-05 DIAGNOSIS — I480 Paroxysmal atrial fibrillation: Secondary | ICD-10-CM | POA: Diagnosis not present

## 2018-10-17 DIAGNOSIS — I509 Heart failure, unspecified: Secondary | ICD-10-CM | POA: Diagnosis not present

## 2018-10-17 DIAGNOSIS — I4891 Unspecified atrial fibrillation: Secondary | ICD-10-CM | POA: Diagnosis not present

## 2018-10-17 DIAGNOSIS — J9611 Chronic respiratory failure with hypoxia: Secondary | ICD-10-CM | POA: Diagnosis not present

## 2018-10-19 DIAGNOSIS — E859 Amyloidosis, unspecified: Secondary | ICD-10-CM | POA: Diagnosis not present

## 2018-10-19 DIAGNOSIS — Z006 Encounter for examination for normal comparison and control in clinical research program: Secondary | ICD-10-CM | POA: Diagnosis not present

## 2018-10-19 DIAGNOSIS — M25512 Pain in left shoulder: Secondary | ICD-10-CM | POA: Diagnosis not present

## 2018-10-19 DIAGNOSIS — D61818 Other pancytopenia: Secondary | ICD-10-CM | POA: Diagnosis not present

## 2018-10-19 DIAGNOSIS — E854 Organ-limited amyloidosis: Secondary | ICD-10-CM | POA: Diagnosis not present

## 2018-10-19 DIAGNOSIS — Z882 Allergy status to sulfonamides status: Secondary | ICD-10-CM | POA: Diagnosis not present

## 2018-10-19 DIAGNOSIS — I43 Cardiomyopathy in diseases classified elsewhere: Secondary | ICD-10-CM | POA: Diagnosis not present

## 2018-10-19 DIAGNOSIS — H538 Other visual disturbances: Secondary | ICD-10-CM | POA: Diagnosis not present

## 2018-10-19 DIAGNOSIS — H3530 Unspecified macular degeneration: Secondary | ICD-10-CM | POA: Diagnosis not present

## 2018-10-19 DIAGNOSIS — Z885 Allergy status to narcotic agent status: Secondary | ICD-10-CM | POA: Diagnosis not present

## 2018-10-19 DIAGNOSIS — R0609 Other forms of dyspnea: Secondary | ICD-10-CM | POA: Diagnosis not present

## 2018-10-19 DIAGNOSIS — M79602 Pain in left arm: Secondary | ICD-10-CM | POA: Diagnosis not present

## 2018-10-19 DIAGNOSIS — E8581 Light chain (AL) amyloidosis: Secondary | ICD-10-CM | POA: Diagnosis not present

## 2018-10-19 DIAGNOSIS — I4891 Unspecified atrial fibrillation: Secondary | ICD-10-CM | POA: Diagnosis not present

## 2018-10-31 DIAGNOSIS — H353132 Nonexudative age-related macular degeneration, bilateral, intermediate dry stage: Secondary | ICD-10-CM | POA: Diagnosis not present

## 2018-10-31 DIAGNOSIS — H43812 Vitreous degeneration, left eye: Secondary | ICD-10-CM | POA: Diagnosis not present

## 2018-10-31 DIAGNOSIS — H353221 Exudative age-related macular degeneration, left eye, with active choroidal neovascularization: Secondary | ICD-10-CM | POA: Diagnosis not present

## 2018-11-01 DIAGNOSIS — I517 Cardiomegaly: Secondary | ICD-10-CM | POA: Diagnosis not present

## 2018-11-01 DIAGNOSIS — R0609 Other forms of dyspnea: Secondary | ICD-10-CM | POA: Diagnosis not present

## 2018-11-01 DIAGNOSIS — I700 Atherosclerosis of aorta: Secondary | ICD-10-CM | POA: Diagnosis not present

## 2018-11-01 DIAGNOSIS — E8581 Light chain (AL) amyloidosis: Secondary | ICD-10-CM | POA: Diagnosis not present

## 2018-11-01 DIAGNOSIS — I349 Nonrheumatic mitral valve disorder, unspecified: Secondary | ICD-10-CM | POA: Diagnosis not present

## 2018-11-01 DIAGNOSIS — Z006 Encounter for examination for normal comparison and control in clinical research program: Secondary | ICD-10-CM | POA: Diagnosis not present

## 2018-11-15 DIAGNOSIS — Z885 Allergy status to narcotic agent status: Secondary | ICD-10-CM | POA: Diagnosis not present

## 2018-11-15 DIAGNOSIS — Z006 Encounter for examination for normal comparison and control in clinical research program: Secondary | ICD-10-CM | POA: Diagnosis not present

## 2018-11-15 DIAGNOSIS — E8581 Light chain (AL) amyloidosis: Secondary | ICD-10-CM | POA: Diagnosis not present

## 2018-11-15 DIAGNOSIS — H3530 Unspecified macular degeneration: Secondary | ICD-10-CM | POA: Diagnosis not present

## 2018-11-15 DIAGNOSIS — Z79899 Other long term (current) drug therapy: Secondary | ICD-10-CM | POA: Diagnosis not present

## 2018-11-15 DIAGNOSIS — Z882 Allergy status to sulfonamides status: Secondary | ICD-10-CM | POA: Diagnosis not present

## 2018-11-15 DIAGNOSIS — C9001 Multiple myeloma in remission: Secondary | ICD-10-CM | POA: Diagnosis not present

## 2018-11-15 DIAGNOSIS — E859 Amyloidosis, unspecified: Secondary | ICD-10-CM | POA: Diagnosis not present

## 2018-11-15 DIAGNOSIS — I4891 Unspecified atrial fibrillation: Secondary | ICD-10-CM | POA: Diagnosis not present

## 2018-11-15 DIAGNOSIS — D61818 Other pancytopenia: Secondary | ICD-10-CM | POA: Diagnosis not present

## 2018-11-15 DIAGNOSIS — I509 Heart failure, unspecified: Secondary | ICD-10-CM | POA: Diagnosis not present

## 2018-11-15 DIAGNOSIS — Z5112 Encounter for antineoplastic immunotherapy: Secondary | ICD-10-CM | POA: Diagnosis not present

## 2018-11-15 DIAGNOSIS — R0609 Other forms of dyspnea: Secondary | ICD-10-CM | POA: Diagnosis not present

## 2018-11-29 DIAGNOSIS — Z006 Encounter for examination for normal comparison and control in clinical research program: Secondary | ICD-10-CM | POA: Diagnosis not present

## 2018-11-29 DIAGNOSIS — E8581 Light chain (AL) amyloidosis: Secondary | ICD-10-CM | POA: Diagnosis not present

## 2018-11-29 DIAGNOSIS — R0609 Other forms of dyspnea: Secondary | ICD-10-CM | POA: Diagnosis not present

## 2018-12-02 DIAGNOSIS — N3020 Other chronic cystitis without hematuria: Secondary | ICD-10-CM | POA: Diagnosis not present

## 2018-12-02 DIAGNOSIS — N952 Postmenopausal atrophic vaginitis: Secondary | ICD-10-CM | POA: Diagnosis not present

## 2018-12-08 DIAGNOSIS — H353221 Exudative age-related macular degeneration, left eye, with active choroidal neovascularization: Secondary | ICD-10-CM | POA: Diagnosis not present

## 2018-12-08 DIAGNOSIS — H353132 Nonexudative age-related macular degeneration, bilateral, intermediate dry stage: Secondary | ICD-10-CM | POA: Diagnosis not present

## 2018-12-13 DIAGNOSIS — I509 Heart failure, unspecified: Secondary | ICD-10-CM | POA: Diagnosis not present

## 2018-12-13 DIAGNOSIS — Z7901 Long term (current) use of anticoagulants: Secondary | ICD-10-CM | POA: Diagnosis not present

## 2018-12-13 DIAGNOSIS — I43 Cardiomyopathy in diseases classified elsewhere: Secondary | ICD-10-CM | POA: Diagnosis not present

## 2018-12-13 DIAGNOSIS — H3530 Unspecified macular degeneration: Secondary | ICD-10-CM | POA: Diagnosis not present

## 2018-12-13 DIAGNOSIS — E854 Organ-limited amyloidosis: Secondary | ICD-10-CM | POA: Diagnosis not present

## 2018-12-13 DIAGNOSIS — D61818 Other pancytopenia: Secondary | ICD-10-CM | POA: Diagnosis not present

## 2018-12-13 DIAGNOSIS — I4891 Unspecified atrial fibrillation: Secondary | ICD-10-CM | POA: Diagnosis not present

## 2018-12-13 DIAGNOSIS — R0609 Other forms of dyspnea: Secondary | ICD-10-CM | POA: Diagnosis not present

## 2018-12-13 DIAGNOSIS — Z006 Encounter for examination for normal comparison and control in clinical research program: Secondary | ICD-10-CM | POA: Diagnosis not present

## 2018-12-13 DIAGNOSIS — E8581 Light chain (AL) amyloidosis: Secondary | ICD-10-CM | POA: Diagnosis not present

## 2018-12-27 DIAGNOSIS — R0609 Other forms of dyspnea: Secondary | ICD-10-CM | POA: Diagnosis not present

## 2018-12-27 DIAGNOSIS — E8581 Light chain (AL) amyloidosis: Secondary | ICD-10-CM | POA: Diagnosis not present

## 2018-12-27 DIAGNOSIS — Z006 Encounter for examination for normal comparison and control in clinical research program: Secondary | ICD-10-CM | POA: Diagnosis not present

## 2019-01-03 DIAGNOSIS — I484 Atypical atrial flutter: Secondary | ICD-10-CM | POA: Diagnosis not present

## 2019-01-03 DIAGNOSIS — I5030 Unspecified diastolic (congestive) heart failure: Secondary | ICD-10-CM | POA: Diagnosis not present

## 2019-01-03 DIAGNOSIS — I43 Cardiomyopathy in diseases classified elsewhere: Secondary | ICD-10-CM | POA: Diagnosis not present

## 2019-01-03 DIAGNOSIS — I480 Paroxysmal atrial fibrillation: Secondary | ICD-10-CM | POA: Diagnosis not present

## 2019-01-03 DIAGNOSIS — E854 Organ-limited amyloidosis: Secondary | ICD-10-CM | POA: Diagnosis not present

## 2019-01-03 DIAGNOSIS — E8581 Light chain (AL) amyloidosis: Secondary | ICD-10-CM | POA: Diagnosis not present

## 2019-01-05 DIAGNOSIS — Z23 Encounter for immunization: Secondary | ICD-10-CM | POA: Diagnosis not present

## 2019-01-10 DIAGNOSIS — I5032 Chronic diastolic (congestive) heart failure: Secondary | ICD-10-CM | POA: Diagnosis not present

## 2019-01-10 DIAGNOSIS — R0609 Other forms of dyspnea: Secondary | ICD-10-CM | POA: Diagnosis not present

## 2019-01-10 DIAGNOSIS — Z45018 Encounter for adjustment and management of other part of cardiac pacemaker: Secondary | ICD-10-CM | POA: Diagnosis not present

## 2019-01-10 DIAGNOSIS — C9001 Multiple myeloma in remission: Secondary | ICD-10-CM | POA: Diagnosis not present

## 2019-01-10 DIAGNOSIS — I480 Paroxysmal atrial fibrillation: Secondary | ICD-10-CM | POA: Diagnosis not present

## 2019-01-10 DIAGNOSIS — I509 Heart failure, unspecified: Secondary | ICD-10-CM | POA: Diagnosis not present

## 2019-01-10 DIAGNOSIS — I442 Atrioventricular block, complete: Secondary | ICD-10-CM | POA: Diagnosis not present

## 2019-01-10 DIAGNOSIS — Z5111 Encounter for antineoplastic chemotherapy: Secondary | ICD-10-CM | POA: Diagnosis not present

## 2019-01-10 DIAGNOSIS — Z006 Encounter for examination for normal comparison and control in clinical research program: Secondary | ICD-10-CM | POA: Diagnosis not present

## 2019-01-10 DIAGNOSIS — Z950 Presence of cardiac pacemaker: Secondary | ICD-10-CM | POA: Diagnosis not present

## 2019-01-10 DIAGNOSIS — I4891 Unspecified atrial fibrillation: Secondary | ICD-10-CM | POA: Diagnosis not present

## 2019-01-10 DIAGNOSIS — E8581 Light chain (AL) amyloidosis: Secondary | ICD-10-CM | POA: Diagnosis not present

## 2019-01-10 DIAGNOSIS — E859 Amyloidosis, unspecified: Secondary | ICD-10-CM | POA: Diagnosis not present

## 2019-01-10 DIAGNOSIS — I4892 Unspecified atrial flutter: Secondary | ICD-10-CM | POA: Diagnosis not present

## 2019-01-23 DIAGNOSIS — I480 Paroxysmal atrial fibrillation: Secondary | ICD-10-CM | POA: Diagnosis not present

## 2019-01-23 DIAGNOSIS — I491 Atrial premature depolarization: Secondary | ICD-10-CM | POA: Diagnosis not present

## 2019-01-23 DIAGNOSIS — E854 Organ-limited amyloidosis: Secondary | ICD-10-CM | POA: Diagnosis not present

## 2019-01-23 DIAGNOSIS — I484 Atypical atrial flutter: Secondary | ICD-10-CM | POA: Diagnosis not present

## 2019-01-23 DIAGNOSIS — Z950 Presence of cardiac pacemaker: Secondary | ICD-10-CM | POA: Diagnosis not present

## 2019-01-23 DIAGNOSIS — I5032 Chronic diastolic (congestive) heart failure: Secondary | ICD-10-CM | POA: Diagnosis not present

## 2019-01-23 DIAGNOSIS — I43 Cardiomyopathy in diseases classified elsewhere: Secondary | ICD-10-CM | POA: Diagnosis not present

## 2019-01-24 DIAGNOSIS — Z006 Encounter for examination for normal comparison and control in clinical research program: Secondary | ICD-10-CM | POA: Diagnosis not present

## 2019-01-24 DIAGNOSIS — R0600 Dyspnea, unspecified: Secondary | ICD-10-CM | POA: Diagnosis not present

## 2019-01-24 DIAGNOSIS — E8581 Light chain (AL) amyloidosis: Secondary | ICD-10-CM | POA: Diagnosis not present

## 2019-01-25 DIAGNOSIS — Z7901 Long term (current) use of anticoagulants: Secondary | ICD-10-CM | POA: Diagnosis not present

## 2019-01-25 DIAGNOSIS — C9000 Multiple myeloma not having achieved remission: Secondary | ICD-10-CM | POA: Diagnosis not present

## 2019-01-25 DIAGNOSIS — Z882 Allergy status to sulfonamides status: Secondary | ICD-10-CM | POA: Diagnosis not present

## 2019-01-25 DIAGNOSIS — Z885 Allergy status to narcotic agent status: Secondary | ICD-10-CM | POA: Diagnosis not present

## 2019-01-25 DIAGNOSIS — Z79899 Other long term (current) drug therapy: Secondary | ICD-10-CM | POA: Diagnosis not present

## 2019-01-25 DIAGNOSIS — R52 Pain, unspecified: Secondary | ICD-10-CM | POA: Diagnosis not present

## 2019-01-25 DIAGNOSIS — Z79891 Long term (current) use of opiate analgesic: Secondary | ICD-10-CM | POA: Diagnosis not present

## 2019-01-25 DIAGNOSIS — Z888 Allergy status to other drugs, medicaments and biological substances status: Secondary | ICD-10-CM | POA: Diagnosis not present

## 2019-01-26 DIAGNOSIS — H353221 Exudative age-related macular degeneration, left eye, with active choroidal neovascularization: Secondary | ICD-10-CM | POA: Diagnosis not present

## 2019-01-26 DIAGNOSIS — H43812 Vitreous degeneration, left eye: Secondary | ICD-10-CM | POA: Diagnosis not present

## 2019-02-07 DIAGNOSIS — R0600 Dyspnea, unspecified: Secondary | ICD-10-CM | POA: Diagnosis not present

## 2019-02-07 DIAGNOSIS — I5032 Chronic diastolic (congestive) heart failure: Secondary | ICD-10-CM | POA: Diagnosis not present

## 2019-02-07 DIAGNOSIS — Z006 Encounter for examination for normal comparison and control in clinical research program: Secondary | ICD-10-CM | POA: Diagnosis not present

## 2019-02-07 DIAGNOSIS — E8581 Light chain (AL) amyloidosis: Secondary | ICD-10-CM | POA: Diagnosis not present

## 2019-02-20 DIAGNOSIS — I5030 Unspecified diastolic (congestive) heart failure: Secondary | ICD-10-CM | POA: Diagnosis not present

## 2019-02-20 DIAGNOSIS — J9611 Chronic respiratory failure with hypoxia: Secondary | ICD-10-CM | POA: Diagnosis not present

## 2019-02-20 DIAGNOSIS — E854 Organ-limited amyloidosis: Secondary | ICD-10-CM | POA: Diagnosis not present

## 2019-02-20 DIAGNOSIS — I482 Chronic atrial fibrillation, unspecified: Secondary | ICD-10-CM | POA: Diagnosis not present

## 2019-02-22 DIAGNOSIS — E8581 Light chain (AL) amyloidosis: Secondary | ICD-10-CM | POA: Diagnosis not present

## 2019-02-22 DIAGNOSIS — R0600 Dyspnea, unspecified: Secondary | ICD-10-CM | POA: Diagnosis not present

## 2019-02-22 DIAGNOSIS — Z006 Encounter for examination for normal comparison and control in clinical research program: Secondary | ICD-10-CM | POA: Diagnosis not present

## 2019-02-27 DIAGNOSIS — Z0189 Encounter for other specified special examinations: Secondary | ICD-10-CM | POA: Diagnosis not present

## 2019-02-27 DIAGNOSIS — G894 Chronic pain syndrome: Secondary | ICD-10-CM | POA: Diagnosis not present

## 2019-02-27 DIAGNOSIS — E039 Hypothyroidism, unspecified: Secondary | ICD-10-CM | POA: Diagnosis not present

## 2019-02-27 DIAGNOSIS — E859 Amyloidosis, unspecified: Secondary | ICD-10-CM | POA: Diagnosis not present

## 2019-02-27 DIAGNOSIS — I509 Heart failure, unspecified: Secondary | ICD-10-CM | POA: Diagnosis not present

## 2019-02-27 DIAGNOSIS — N390 Urinary tract infection, site not specified: Secondary | ICD-10-CM | POA: Diagnosis not present

## 2019-02-27 DIAGNOSIS — R8290 Unspecified abnormal findings in urine: Secondary | ICD-10-CM | POA: Diagnosis not present

## 2019-03-07 DIAGNOSIS — R0600 Dyspnea, unspecified: Secondary | ICD-10-CM | POA: Diagnosis not present

## 2019-03-07 DIAGNOSIS — I4892 Unspecified atrial flutter: Secondary | ICD-10-CM | POA: Diagnosis not present

## 2019-03-07 DIAGNOSIS — E8581 Light chain (AL) amyloidosis: Secondary | ICD-10-CM | POA: Diagnosis not present

## 2019-03-07 DIAGNOSIS — I5032 Chronic diastolic (congestive) heart failure: Secondary | ICD-10-CM | POA: Diagnosis not present

## 2019-03-07 DIAGNOSIS — Z006 Encounter for examination for normal comparison and control in clinical research program: Secondary | ICD-10-CM | POA: Diagnosis not present

## 2019-03-09 DIAGNOSIS — Z20828 Contact with and (suspected) exposure to other viral communicable diseases: Secondary | ICD-10-CM | POA: Diagnosis not present

## 2019-03-09 DIAGNOSIS — J90 Pleural effusion, not elsewhere classified: Secondary | ICD-10-CM | POA: Diagnosis not present

## 2019-03-09 DIAGNOSIS — R531 Weakness: Secondary | ICD-10-CM | POA: Diagnosis not present

## 2019-03-09 DIAGNOSIS — I509 Heart failure, unspecified: Secondary | ICD-10-CM | POA: Diagnosis not present

## 2019-03-09 DIAGNOSIS — I444 Left anterior fascicular block: Secondary | ICD-10-CM | POA: Diagnosis not present

## 2019-03-09 DIAGNOSIS — R5383 Other fatigue: Secondary | ICD-10-CM | POA: Diagnosis not present

## 2019-03-09 DIAGNOSIS — N952 Postmenopausal atrophic vaginitis: Secondary | ICD-10-CM | POA: Diagnosis not present

## 2019-03-09 DIAGNOSIS — J811 Chronic pulmonary edema: Secondary | ICD-10-CM | POA: Diagnosis not present

## 2019-03-10 DIAGNOSIS — E8581 Light chain (AL) amyloidosis: Secondary | ICD-10-CM | POA: Diagnosis not present

## 2019-03-10 DIAGNOSIS — R0602 Shortness of breath: Secondary | ICD-10-CM | POA: Diagnosis not present

## 2019-03-10 DIAGNOSIS — E871 Hypo-osmolality and hyponatremia: Secondary | ICD-10-CM | POA: Diagnosis not present

## 2019-03-10 DIAGNOSIS — G893 Neoplasm related pain (acute) (chronic): Secondary | ICD-10-CM | POA: Diagnosis not present

## 2019-03-11 DIAGNOSIS — E871 Hypo-osmolality and hyponatremia: Secondary | ICD-10-CM | POA: Diagnosis not present

## 2019-03-11 DIAGNOSIS — G893 Neoplasm related pain (acute) (chronic): Secondary | ICD-10-CM | POA: Diagnosis not present

## 2019-03-11 DIAGNOSIS — E8581 Light chain (AL) amyloidosis: Secondary | ICD-10-CM | POA: Diagnosis not present

## 2019-03-11 DIAGNOSIS — R0602 Shortness of breath: Secondary | ICD-10-CM | POA: Diagnosis not present

## 2019-03-13 ENCOUNTER — Ambulatory Visit (INDEPENDENT_AMBULATORY_CARE_PROVIDER_SITE_OTHER): Payer: Medicare Other | Admitting: Otolaryngology

## 2019-03-13 DIAGNOSIS — I480 Paroxysmal atrial fibrillation: Secondary | ICD-10-CM | POA: Diagnosis not present

## 2019-03-13 DIAGNOSIS — E8581 Light chain (AL) amyloidosis: Secondary | ICD-10-CM | POA: Diagnosis not present

## 2019-03-13 DIAGNOSIS — R05 Cough: Secondary | ICD-10-CM | POA: Diagnosis not present

## 2019-03-13 DIAGNOSIS — R6883 Chills (without fever): Secondary | ICD-10-CM | POA: Diagnosis not present

## 2019-03-13 DIAGNOSIS — J189 Pneumonia, unspecified organism: Secondary | ICD-10-CM | POA: Diagnosis not present

## 2019-03-13 DIAGNOSIS — E871 Hypo-osmolality and hyponatremia: Secondary | ICD-10-CM | POA: Diagnosis not present

## 2019-03-13 DIAGNOSIS — I5032 Chronic diastolic (congestive) heart failure: Secondary | ICD-10-CM | POA: Diagnosis not present

## 2019-03-13 DIAGNOSIS — R918 Other nonspecific abnormal finding of lung field: Secondary | ICD-10-CM | POA: Diagnosis not present

## 2019-03-13 DIAGNOSIS — Z20828 Contact with and (suspected) exposure to other viral communicable diseases: Secondary | ICD-10-CM | POA: Diagnosis not present

## 2019-03-13 DIAGNOSIS — N390 Urinary tract infection, site not specified: Secondary | ICD-10-CM | POA: Diagnosis not present

## 2019-03-13 DIAGNOSIS — R9431 Abnormal electrocardiogram [ECG] [EKG]: Secondary | ICD-10-CM | POA: Diagnosis not present

## 2019-03-13 DIAGNOSIS — R0602 Shortness of breath: Secondary | ICD-10-CM | POA: Diagnosis not present

## 2019-03-14 DIAGNOSIS — Z882 Allergy status to sulfonamides status: Secondary | ICD-10-CM | POA: Diagnosis not present

## 2019-03-14 DIAGNOSIS — I480 Paroxysmal atrial fibrillation: Secondary | ICD-10-CM | POA: Diagnosis present

## 2019-03-14 DIAGNOSIS — Z7901 Long term (current) use of anticoagulants: Secondary | ICD-10-CM | POA: Diagnosis not present

## 2019-03-14 DIAGNOSIS — B9620 Unspecified Escherichia coli [E. coli] as the cause of diseases classified elsewhere: Secondary | ICD-10-CM | POA: Diagnosis present

## 2019-03-14 DIAGNOSIS — J189 Pneumonia, unspecified organism: Secondary | ICD-10-CM | POA: Diagnosis not present

## 2019-03-14 DIAGNOSIS — D649 Anemia, unspecified: Secondary | ICD-10-CM | POA: Diagnosis not present

## 2019-03-14 DIAGNOSIS — R627 Adult failure to thrive: Secondary | ICD-10-CM | POA: Diagnosis present

## 2019-03-14 DIAGNOSIS — N390 Urinary tract infection, site not specified: Secondary | ICD-10-CM | POA: Diagnosis present

## 2019-03-14 DIAGNOSIS — K219 Gastro-esophageal reflux disease without esophagitis: Secondary | ICD-10-CM | POA: Diagnosis present

## 2019-03-14 DIAGNOSIS — Z8669 Personal history of other diseases of the nervous system and sense organs: Secondary | ICD-10-CM | POA: Diagnosis not present

## 2019-03-14 DIAGNOSIS — E871 Hypo-osmolality and hyponatremia: Secondary | ICD-10-CM | POA: Diagnosis not present

## 2019-03-14 DIAGNOSIS — Z950 Presence of cardiac pacemaker: Secondary | ICD-10-CM | POA: Diagnosis not present

## 2019-03-14 DIAGNOSIS — E039 Hypothyroidism, unspecified: Secondary | ICD-10-CM | POA: Diagnosis present

## 2019-03-14 DIAGNOSIS — Z20828 Contact with and (suspected) exposure to other viral communicable diseases: Secondary | ICD-10-CM | POA: Diagnosis present

## 2019-03-14 DIAGNOSIS — J3489 Other specified disorders of nose and nasal sinuses: Secondary | ICD-10-CM | POA: Diagnosis not present

## 2019-03-14 DIAGNOSIS — Z8679 Personal history of other diseases of the circulatory system: Secondary | ICD-10-CM | POA: Diagnosis not present

## 2019-03-14 DIAGNOSIS — I5032 Chronic diastolic (congestive) heart failure: Secondary | ICD-10-CM | POA: Diagnosis present

## 2019-03-14 DIAGNOSIS — E854 Organ-limited amyloidosis: Secondary | ICD-10-CM | POA: Diagnosis not present

## 2019-03-14 DIAGNOSIS — E8581 Light chain (AL) amyloidosis: Secondary | ICD-10-CM | POA: Diagnosis present

## 2019-03-22 DIAGNOSIS — I5032 Chronic diastolic (congestive) heart failure: Secondary | ICD-10-CM | POA: Diagnosis not present

## 2019-03-22 DIAGNOSIS — J849 Interstitial pulmonary disease, unspecified: Secondary | ICD-10-CM | POA: Diagnosis not present

## 2019-03-22 DIAGNOSIS — D72829 Elevated white blood cell count, unspecified: Secondary | ICD-10-CM | POA: Diagnosis not present

## 2019-03-22 DIAGNOSIS — E8581 Light chain (AL) amyloidosis: Secondary | ICD-10-CM | POA: Diagnosis not present

## 2019-03-22 DIAGNOSIS — R6510 Systemic inflammatory response syndrome (SIRS) of non-infectious origin without acute organ dysfunction: Secondary | ICD-10-CM | POA: Diagnosis not present

## 2019-03-22 DIAGNOSIS — E871 Hypo-osmolality and hyponatremia: Secondary | ICD-10-CM | POA: Diagnosis not present

## 2019-03-22 DIAGNOSIS — I43 Cardiomyopathy in diseases classified elsewhere: Secondary | ICD-10-CM | POA: Diagnosis not present

## 2019-03-22 DIAGNOSIS — R918 Other nonspecific abnormal finding of lung field: Secondary | ICD-10-CM | POA: Diagnosis not present

## 2019-03-22 DIAGNOSIS — I5033 Acute on chronic diastolic (congestive) heart failure: Secondary | ICD-10-CM | POA: Diagnosis not present

## 2019-03-22 DIAGNOSIS — E854 Organ-limited amyloidosis: Secondary | ICD-10-CM | POA: Diagnosis not present

## 2019-03-23 DIAGNOSIS — Z1623 Resistance to quinolones and fluoroquinolones: Secondary | ICD-10-CM | POA: Diagnosis present

## 2019-03-23 DIAGNOSIS — I43 Cardiomyopathy in diseases classified elsewhere: Secondary | ICD-10-CM | POA: Diagnosis present

## 2019-03-23 DIAGNOSIS — Z90710 Acquired absence of both cervix and uterus: Secondary | ICD-10-CM | POA: Diagnosis not present

## 2019-03-23 DIAGNOSIS — E8581 Light chain (AL) amyloidosis: Secondary | ICD-10-CM | POA: Diagnosis not present

## 2019-03-23 DIAGNOSIS — I5033 Acute on chronic diastolic (congestive) heart failure: Secondary | ICD-10-CM | POA: Diagnosis present

## 2019-03-23 DIAGNOSIS — E871 Hypo-osmolality and hyponatremia: Secondary | ICD-10-CM | POA: Diagnosis not present

## 2019-03-23 DIAGNOSIS — D72829 Elevated white blood cell count, unspecified: Secondary | ICD-10-CM | POA: Diagnosis not present

## 2019-03-23 DIAGNOSIS — Z20828 Contact with and (suspected) exposure to other viral communicable diseases: Secondary | ICD-10-CM | POA: Diagnosis present

## 2019-03-23 DIAGNOSIS — J849 Interstitial pulmonary disease, unspecified: Secondary | ICD-10-CM | POA: Diagnosis not present

## 2019-03-23 DIAGNOSIS — N12 Tubulo-interstitial nephritis, not specified as acute or chronic: Secondary | ICD-10-CM | POA: Diagnosis not present

## 2019-03-23 DIAGNOSIS — J984 Other disorders of lung: Secondary | ICD-10-CM | POA: Diagnosis present

## 2019-03-23 DIAGNOSIS — E039 Hypothyroidism, unspecified: Secondary | ICD-10-CM | POA: Diagnosis present

## 2019-03-23 DIAGNOSIS — Z7901 Long term (current) use of anticoagulants: Secondary | ICD-10-CM | POA: Diagnosis not present

## 2019-03-23 DIAGNOSIS — A419 Sepsis, unspecified organism: Secondary | ICD-10-CM | POA: Diagnosis not present

## 2019-03-23 DIAGNOSIS — J069 Acute upper respiratory infection, unspecified: Secondary | ICD-10-CM | POA: Diagnosis present

## 2019-03-23 DIAGNOSIS — I071 Rheumatic tricuspid insufficiency: Secondary | ICD-10-CM | POA: Diagnosis not present

## 2019-03-23 DIAGNOSIS — G8929 Other chronic pain: Secondary | ICD-10-CM | POA: Diagnosis present

## 2019-03-23 DIAGNOSIS — J90 Pleural effusion, not elsewhere classified: Secondary | ICD-10-CM | POA: Diagnosis not present

## 2019-03-23 DIAGNOSIS — J99 Respiratory disorders in diseases classified elsewhere: Secondary | ICD-10-CM | POA: Diagnosis not present

## 2019-03-23 DIAGNOSIS — Z950 Presence of cardiac pacemaker: Secondary | ICD-10-CM | POA: Diagnosis not present

## 2019-03-23 DIAGNOSIS — R0602 Shortness of breath: Secondary | ICD-10-CM | POA: Diagnosis not present

## 2019-03-23 DIAGNOSIS — I480 Paroxysmal atrial fibrillation: Secondary | ICD-10-CM | POA: Diagnosis present

## 2019-03-23 DIAGNOSIS — I484 Atypical atrial flutter: Secondary | ICD-10-CM | POA: Diagnosis not present

## 2019-03-23 DIAGNOSIS — I5032 Chronic diastolic (congestive) heart failure: Secondary | ICD-10-CM | POA: Diagnosis not present

## 2019-03-23 DIAGNOSIS — R64 Cachexia: Secondary | ICD-10-CM | POA: Diagnosis present

## 2019-03-23 DIAGNOSIS — D849 Immunodeficiency, unspecified: Secondary | ICD-10-CM | POA: Diagnosis present

## 2019-03-23 DIAGNOSIS — K219 Gastro-esophageal reflux disease without esophagitis: Secondary | ICD-10-CM | POA: Diagnosis present

## 2019-03-23 DIAGNOSIS — R6510 Systemic inflammatory response syndrome (SIRS) of non-infectious origin without acute organ dysfunction: Secondary | ICD-10-CM | POA: Diagnosis not present

## 2019-03-23 DIAGNOSIS — E854 Organ-limited amyloidosis: Secondary | ICD-10-CM | POA: Diagnosis not present

## 2019-03-23 DIAGNOSIS — Z681 Body mass index (BMI) 19 or less, adult: Secondary | ICD-10-CM | POA: Diagnosis not present

## 2019-03-28 DIAGNOSIS — Z9229 Personal history of other drug therapy: Secondary | ICD-10-CM | POA: Diagnosis not present

## 2019-03-28 DIAGNOSIS — E854 Organ-limited amyloidosis: Secondary | ICD-10-CM | POA: Diagnosis not present

## 2019-03-28 DIAGNOSIS — I5032 Chronic diastolic (congestive) heart failure: Secondary | ICD-10-CM | POA: Diagnosis not present

## 2019-03-28 DIAGNOSIS — Z950 Presence of cardiac pacemaker: Secondary | ICD-10-CM | POA: Diagnosis not present

## 2019-03-28 DIAGNOSIS — I43 Cardiomyopathy in diseases classified elsewhere: Secondary | ICD-10-CM | POA: Diagnosis not present

## 2019-03-28 DIAGNOSIS — I480 Paroxysmal atrial fibrillation: Secondary | ICD-10-CM | POA: Diagnosis not present

## 2019-04-04 DIAGNOSIS — Z882 Allergy status to sulfonamides status: Secondary | ICD-10-CM | POA: Diagnosis not present

## 2019-04-04 DIAGNOSIS — Z885 Allergy status to narcotic agent status: Secondary | ICD-10-CM | POA: Diagnosis not present

## 2019-04-04 DIAGNOSIS — R0600 Dyspnea, unspecified: Secondary | ICD-10-CM | POA: Diagnosis not present

## 2019-04-04 DIAGNOSIS — I43 Cardiomyopathy in diseases classified elsewhere: Secondary | ICD-10-CM | POA: Diagnosis not present

## 2019-04-04 DIAGNOSIS — D61818 Other pancytopenia: Secondary | ICD-10-CM | POA: Diagnosis not present

## 2019-04-04 DIAGNOSIS — K219 Gastro-esophageal reflux disease without esophagitis: Secondary | ICD-10-CM | POA: Diagnosis not present

## 2019-04-04 DIAGNOSIS — Z7901 Long term (current) use of anticoagulants: Secondary | ICD-10-CM | POA: Diagnosis not present

## 2019-04-04 DIAGNOSIS — Z006 Encounter for examination for normal comparison and control in clinical research program: Secondary | ICD-10-CM | POA: Diagnosis not present

## 2019-04-04 DIAGNOSIS — E8581 Light chain (AL) amyloidosis: Secondary | ICD-10-CM | POA: Diagnosis not present

## 2019-04-04 DIAGNOSIS — E785 Hyperlipidemia, unspecified: Secondary | ICD-10-CM | POA: Diagnosis not present

## 2019-04-04 DIAGNOSIS — C9001 Multiple myeloma in remission: Secondary | ICD-10-CM | POA: Diagnosis not present

## 2019-04-04 DIAGNOSIS — Z950 Presence of cardiac pacemaker: Secondary | ICD-10-CM | POA: Diagnosis not present

## 2019-04-04 DIAGNOSIS — I4891 Unspecified atrial fibrillation: Secondary | ICD-10-CM | POA: Diagnosis not present

## 2019-04-04 DIAGNOSIS — J189 Pneumonia, unspecified organism: Secondary | ICD-10-CM | POA: Diagnosis not present

## 2019-04-04 DIAGNOSIS — H3530 Unspecified macular degeneration: Secondary | ICD-10-CM | POA: Diagnosis not present

## 2019-04-04 DIAGNOSIS — E871 Hypo-osmolality and hyponatremia: Secondary | ICD-10-CM | POA: Diagnosis not present

## 2019-04-04 DIAGNOSIS — I509 Heart failure, unspecified: Secondary | ICD-10-CM | POA: Diagnosis not present

## 2019-04-04 DIAGNOSIS — E854 Organ-limited amyloidosis: Secondary | ICD-10-CM | POA: Diagnosis not present

## 2019-04-04 DIAGNOSIS — R0602 Shortness of breath: Secondary | ICD-10-CM | POA: Diagnosis not present

## 2019-04-18 DIAGNOSIS — E8581 Light chain (AL) amyloidosis: Secondary | ICD-10-CM | POA: Diagnosis not present

## 2019-04-18 DIAGNOSIS — R0600 Dyspnea, unspecified: Secondary | ICD-10-CM | POA: Diagnosis not present

## 2019-04-18 DIAGNOSIS — Z006 Encounter for examination for normal comparison and control in clinical research program: Secondary | ICD-10-CM | POA: Diagnosis not present

## 2019-04-21 DIAGNOSIS — F419 Anxiety disorder, unspecified: Secondary | ICD-10-CM | POA: Insufficient documentation

## 2019-04-21 DIAGNOSIS — G4700 Insomnia, unspecified: Secondary | ICD-10-CM | POA: Insufficient documentation

## 2019-04-21 DIAGNOSIS — Z86718 Personal history of other venous thrombosis and embolism: Secondary | ICD-10-CM | POA: Insufficient documentation

## 2019-04-21 DIAGNOSIS — I2782 Chronic pulmonary embolism: Secondary | ICD-10-CM | POA: Insufficient documentation

## 2019-04-21 DIAGNOSIS — H3530 Unspecified macular degeneration: Secondary | ICD-10-CM | POA: Insufficient documentation

## 2019-05-01 DIAGNOSIS — Z950 Presence of cardiac pacemaker: Secondary | ICD-10-CM | POA: Diagnosis not present

## 2019-05-09 DIAGNOSIS — H3530 Unspecified macular degeneration: Secondary | ICD-10-CM | POA: Diagnosis not present

## 2019-05-09 DIAGNOSIS — R0600 Dyspnea, unspecified: Secondary | ICD-10-CM | POA: Diagnosis not present

## 2019-05-09 DIAGNOSIS — I272 Pulmonary hypertension, unspecified: Secondary | ICD-10-CM | POA: Diagnosis not present

## 2019-05-09 DIAGNOSIS — Z006 Encounter for examination for normal comparison and control in clinical research program: Secondary | ICD-10-CM | POA: Diagnosis not present

## 2019-05-09 DIAGNOSIS — E8581 Light chain (AL) amyloidosis: Secondary | ICD-10-CM | POA: Diagnosis not present

## 2019-05-09 DIAGNOSIS — I43 Cardiomyopathy in diseases classified elsewhere: Secondary | ICD-10-CM | POA: Diagnosis not present

## 2019-05-09 DIAGNOSIS — I071 Rheumatic tricuspid insufficiency: Secondary | ICD-10-CM | POA: Diagnosis not present

## 2019-05-09 DIAGNOSIS — Z7901 Long term (current) use of anticoagulants: Secondary | ICD-10-CM | POA: Diagnosis not present

## 2019-05-09 DIAGNOSIS — I5032 Chronic diastolic (congestive) heart failure: Secondary | ICD-10-CM | POA: Diagnosis not present

## 2019-05-09 DIAGNOSIS — J849 Interstitial pulmonary disease, unspecified: Secondary | ICD-10-CM | POA: Diagnosis not present

## 2019-05-09 DIAGNOSIS — D61818 Other pancytopenia: Secondary | ICD-10-CM | POA: Diagnosis not present

## 2019-05-09 DIAGNOSIS — Z79899 Other long term (current) drug therapy: Secondary | ICD-10-CM | POA: Diagnosis not present

## 2019-05-09 DIAGNOSIS — I4891 Unspecified atrial fibrillation: Secondary | ICD-10-CM | POA: Diagnosis not present

## 2019-05-09 DIAGNOSIS — I081 Rheumatic disorders of both mitral and tricuspid valves: Secondary | ICD-10-CM | POA: Diagnosis not present

## 2019-05-09 DIAGNOSIS — E854 Organ-limited amyloidosis: Secondary | ICD-10-CM | POA: Diagnosis not present

## 2019-05-11 DIAGNOSIS — R21 Rash and other nonspecific skin eruption: Secondary | ICD-10-CM | POA: Diagnosis not present

## 2019-05-18 DIAGNOSIS — L820 Inflamed seborrheic keratosis: Secondary | ICD-10-CM | POA: Diagnosis not present

## 2019-05-29 DIAGNOSIS — H3562 Retinal hemorrhage, left eye: Secondary | ICD-10-CM | POA: Diagnosis not present

## 2019-05-29 DIAGNOSIS — H353132 Nonexudative age-related macular degeneration, bilateral, intermediate dry stage: Secondary | ICD-10-CM | POA: Diagnosis not present

## 2019-05-29 DIAGNOSIS — H353221 Exudative age-related macular degeneration, left eye, with active choroidal neovascularization: Secondary | ICD-10-CM | POA: Diagnosis not present

## 2019-05-29 DIAGNOSIS — H43812 Vitreous degeneration, left eye: Secondary | ICD-10-CM | POA: Diagnosis not present

## 2019-06-01 DIAGNOSIS — Z23 Encounter for immunization: Secondary | ICD-10-CM | POA: Diagnosis not present

## 2019-06-27 DIAGNOSIS — Z885 Allergy status to narcotic agent status: Secondary | ICD-10-CM | POA: Diagnosis not present

## 2019-06-27 DIAGNOSIS — Z79899 Other long term (current) drug therapy: Secondary | ICD-10-CM | POA: Diagnosis not present

## 2019-06-27 DIAGNOSIS — Z882 Allergy status to sulfonamides status: Secondary | ICD-10-CM | POA: Diagnosis not present

## 2019-06-27 DIAGNOSIS — R600 Localized edema: Secondary | ICD-10-CM | POA: Diagnosis not present

## 2019-06-27 DIAGNOSIS — Z9221 Personal history of antineoplastic chemotherapy: Secondary | ICD-10-CM | POA: Diagnosis not present

## 2019-06-27 DIAGNOSIS — M7989 Other specified soft tissue disorders: Secondary | ICD-10-CM | POA: Diagnosis not present

## 2019-06-27 DIAGNOSIS — Z9225 Personal history of immunosupression therapy: Secondary | ICD-10-CM | POA: Diagnosis not present

## 2019-06-27 DIAGNOSIS — E854 Organ-limited amyloidosis: Secondary | ICD-10-CM | POA: Diagnosis not present

## 2019-06-27 DIAGNOSIS — E039 Hypothyroidism, unspecified: Secondary | ICD-10-CM | POA: Diagnosis not present

## 2019-06-27 DIAGNOSIS — R0600 Dyspnea, unspecified: Secondary | ICD-10-CM | POA: Diagnosis not present

## 2019-06-27 DIAGNOSIS — E8581 Light chain (AL) amyloidosis: Secondary | ICD-10-CM | POA: Diagnosis not present

## 2019-06-27 DIAGNOSIS — Z7901 Long term (current) use of anticoagulants: Secondary | ICD-10-CM | POA: Diagnosis not present

## 2019-06-27 DIAGNOSIS — I480 Paroxysmal atrial fibrillation: Secondary | ICD-10-CM | POA: Diagnosis not present

## 2019-06-27 DIAGNOSIS — I43 Cardiomyopathy in diseases classified elsewhere: Secondary | ICD-10-CM | POA: Diagnosis not present

## 2019-06-27 DIAGNOSIS — Z950 Presence of cardiac pacemaker: Secondary | ICD-10-CM | POA: Diagnosis not present

## 2019-06-28 DIAGNOSIS — N952 Postmenopausal atrophic vaginitis: Secondary | ICD-10-CM | POA: Diagnosis not present

## 2019-06-30 DIAGNOSIS — Z23 Encounter for immunization: Secondary | ICD-10-CM | POA: Diagnosis not present

## 2019-07-05 DIAGNOSIS — E854 Organ-limited amyloidosis: Secondary | ICD-10-CM | POA: Diagnosis not present

## 2019-07-05 DIAGNOSIS — I43 Cardiomyopathy in diseases classified elsewhere: Secondary | ICD-10-CM | POA: Diagnosis not present

## 2019-07-05 DIAGNOSIS — I480 Paroxysmal atrial fibrillation: Secondary | ICD-10-CM | POA: Diagnosis not present

## 2019-07-05 DIAGNOSIS — I5032 Chronic diastolic (congestive) heart failure: Secondary | ICD-10-CM | POA: Diagnosis not present

## 2019-07-05 DIAGNOSIS — E8581 Light chain (AL) amyloidosis: Secondary | ICD-10-CM | POA: Diagnosis not present

## 2019-07-07 DIAGNOSIS — E859 Amyloidosis, unspecified: Secondary | ICD-10-CM | POA: Diagnosis not present

## 2019-07-07 DIAGNOSIS — I509 Heart failure, unspecified: Secondary | ICD-10-CM | POA: Diagnosis not present

## 2019-07-07 DIAGNOSIS — E039 Hypothyroidism, unspecified: Secondary | ICD-10-CM | POA: Diagnosis not present

## 2019-07-07 DIAGNOSIS — G894 Chronic pain syndrome: Secondary | ICD-10-CM | POA: Diagnosis not present

## 2019-07-08 ENCOUNTER — Other Ambulatory Visit: Payer: Self-pay

## 2019-07-08 ENCOUNTER — Encounter (HOSPITAL_COMMUNITY): Payer: Self-pay

## 2019-07-08 ENCOUNTER — Inpatient Hospital Stay (HOSPITAL_COMMUNITY)
Admission: EM | Admit: 2019-07-08 | Discharge: 2019-07-10 | DRG: 176 | Disposition: A | Discharge status: Home / Self Care | Payer: Medicare Other | Attending: Internal Medicine | Admitting: Internal Medicine

## 2019-07-08 ENCOUNTER — Emergency Department (HOSPITAL_COMMUNITY): Payer: Medicare Other

## 2019-07-08 DIAGNOSIS — Z833 Family history of diabetes mellitus: Secondary | ICD-10-CM

## 2019-07-08 DIAGNOSIS — I480 Paroxysmal atrial fibrillation: Secondary | ICD-10-CM | POA: Diagnosis not present

## 2019-07-08 DIAGNOSIS — I361 Nonrheumatic tricuspid (valve) insufficiency: Secondary | ICD-10-CM | POA: Diagnosis not present

## 2019-07-08 DIAGNOSIS — Z79890 Hormone replacement therapy (postmenopausal): Secondary | ICD-10-CM | POA: Diagnosis not present

## 2019-07-08 DIAGNOSIS — I10 Essential (primary) hypertension: Secondary | ICD-10-CM | POA: Diagnosis present

## 2019-07-08 DIAGNOSIS — K219 Gastro-esophageal reflux disease without esophagitis: Secondary | ICD-10-CM | POA: Diagnosis present

## 2019-07-08 DIAGNOSIS — I2694 Multiple subsegmental pulmonary emboli without acute cor pulmonale: Principal | ICD-10-CM | POA: Diagnosis present

## 2019-07-08 DIAGNOSIS — M810 Age-related osteoporosis without current pathological fracture: Secondary | ICD-10-CM | POA: Diagnosis present

## 2019-07-08 DIAGNOSIS — J8489 Other specified interstitial pulmonary diseases: Secondary | ICD-10-CM | POA: Diagnosis present

## 2019-07-08 DIAGNOSIS — Z79891 Long term (current) use of opiate analgesic: Secondary | ICD-10-CM | POA: Diagnosis not present

## 2019-07-08 DIAGNOSIS — E8581 Light chain (AL) amyloidosis: Secondary | ICD-10-CM | POA: Diagnosis not present

## 2019-07-08 DIAGNOSIS — Z7982 Long term (current) use of aspirin: Secondary | ICD-10-CM

## 2019-07-08 DIAGNOSIS — I5032 Chronic diastolic (congestive) heart failure: Secondary | ICD-10-CM | POA: Diagnosis not present

## 2019-07-08 DIAGNOSIS — Z800 Family history of malignant neoplasm of digestive organs: Secondary | ICD-10-CM

## 2019-07-08 DIAGNOSIS — J8410 Pulmonary fibrosis, unspecified: Secondary | ICD-10-CM | POA: Diagnosis present

## 2019-07-08 DIAGNOSIS — I110 Hypertensive heart disease with heart failure: Secondary | ICD-10-CM | POA: Diagnosis present

## 2019-07-08 DIAGNOSIS — I350 Nonrheumatic aortic (valve) stenosis: Secondary | ICD-10-CM | POA: Diagnosis not present

## 2019-07-08 DIAGNOSIS — Z79899 Other long term (current) drug therapy: Secondary | ICD-10-CM

## 2019-07-08 DIAGNOSIS — Z8249 Family history of ischemic heart disease and other diseases of the circulatory system: Secondary | ICD-10-CM

## 2019-07-08 DIAGNOSIS — R079 Chest pain, unspecified: Secondary | ICD-10-CM | POA: Diagnosis not present

## 2019-07-08 DIAGNOSIS — E785 Hyperlipidemia, unspecified: Secondary | ICD-10-CM | POA: Diagnosis present

## 2019-07-08 DIAGNOSIS — I2699 Other pulmonary embolism without acute cor pulmonale: Secondary | ICD-10-CM | POA: Diagnosis not present

## 2019-07-08 DIAGNOSIS — Z20822 Contact with and (suspected) exposure to covid-19: Secondary | ICD-10-CM | POA: Diagnosis not present

## 2019-07-08 DIAGNOSIS — G894 Chronic pain syndrome: Secondary | ICD-10-CM | POA: Diagnosis present

## 2019-07-08 DIAGNOSIS — E039 Hypothyroidism, unspecified: Secondary | ICD-10-CM | POA: Diagnosis present

## 2019-07-08 DIAGNOSIS — Z8051 Family history of malignant neoplasm of kidney: Secondary | ICD-10-CM

## 2019-07-08 DIAGNOSIS — K5150 Left sided colitis without complications: Secondary | ICD-10-CM | POA: Diagnosis present

## 2019-07-08 LAB — COMPREHENSIVE METABOLIC PANEL
ALT: 11 U/L (ref 0–44)
AST: 18 U/L (ref 15–41)
Albumin: 3.8 g/dL (ref 3.5–5.0)
Alkaline Phosphatase: 106 U/L (ref 38–126)
Anion gap: 10 (ref 5–15)
BUN: 15 mg/dL (ref 8–23)
CO2: 26 mmol/L (ref 22–32)
Calcium: 9.2 mg/dL (ref 8.9–10.3)
Chloride: 100 mmol/L (ref 98–111)
Creatinine, Ser: 0.73 mg/dL (ref 0.44–1.00)
GFR calc Af Amer: >60 mL/min (ref >60)
GFR calc non Af Amer: >60 mL/min (ref >60)
Glucose, Bld: 104 mg/dL — ABNORMAL HIGH (ref 70–99)
Potassium: 3.9 mmol/L (ref 3.5–5.1)
Sodium: 136 mmol/L (ref 135–145)
Total Bilirubin: 0.6 mg/dL (ref 0.3–1.2)
Total Protein: 6.2 g/dL — ABNORMAL LOW (ref 6.5–8.1)

## 2019-07-08 LAB — CBC
HCT: 38.7 % (ref 36.0–46.0)
Hemoglobin: 12 g/dL (ref 12.0–15.0)
MCH: 28.9 pg (ref 26.0–34.0)
MCHC: 31 g/dL (ref 30.0–36.0)
MCV: 93.3 fL (ref 80.0–100.0)
Platelets: 239 10*3/uL (ref 150–400)
RBC: 4.15 MIL/uL (ref 3.87–5.11)
RDW: 16.1 % — ABNORMAL HIGH (ref 11.5–15.5)
WBC: 7 10*3/uL (ref 4.0–10.5)
nRBC: 0 % (ref 0.0–0.2)

## 2019-07-08 LAB — TROPONIN I (HIGH SENSITIVITY)
Troponin I (High Sensitivity): 6 ng/L (ref <18)
Troponin I (High Sensitivity): 6 ng/L (ref <18)

## 2019-07-08 LAB — CBG MONITORING, ED: Glucose-Capillary: 89 mg/dL (ref 70–99)

## 2019-07-08 LAB — LIPASE, BLOOD: Lipase: 31 U/L (ref 11–51)

## 2019-07-08 LAB — D-DIMER, QUANTITATIVE: D-Dimer, Quant: 0.88 ug/mL-FEU — ABNORMAL HIGH (ref 0.00–0.50)

## 2019-07-08 MED ORDER — HEPARIN BOLUS VIA INFUSION
4000.0000 [IU] | Freq: Once | INTRAVENOUS | Status: AC
  Administered 2019-07-08: 4000 [IU] via INTRAVENOUS

## 2019-07-08 MED ORDER — IOHEXOL 350 MG/ML SOLN
100.0000 mL | Freq: Once | INTRAVENOUS | Status: AC | PRN
  Administered 2019-07-08: 100 mL via INTRAVENOUS

## 2019-07-08 MED ORDER — HEPARIN (PORCINE) 25000 UT/250ML-% IV SOLN
1150.0000 [IU]/h | INTRAVENOUS | Status: DC
  Administered 2019-07-08 – 2019-07-10 (×2): 900 [IU]/h via INTRAVENOUS
  Filled 2019-07-08 (×2): qty 250

## 2019-07-08 MED ORDER — ONDANSETRON HCL 4 MG PO TABS: 4.0000 mg | ORAL_TABLET | Freq: Four times a day (QID) | ORAL | Status: DC | PRN

## 2019-07-08 MED ORDER — ACETAMINOPHEN 325 MG PO TABS
650.0000 mg | ORAL_TABLET | Freq: Four times a day (QID) | ORAL | Status: DC | PRN
  Administered 2019-07-09: 650 mg via ORAL
  Filled 2019-07-08: qty 2

## 2019-07-08 MED ORDER — ASPIRIN 81 MG PO CHEW
324.0000 mg | CHEWABLE_TABLET | Freq: Once | ORAL | Status: AC
  Administered 2019-07-08: 324 mg via ORAL
  Filled 2019-07-08: qty 4

## 2019-07-08 MED ORDER — ACETAMINOPHEN 650 MG RE SUPP: 650.0000 mg | Freq: Four times a day (QID) | RECTAL | Status: DC | PRN

## 2019-07-08 MED ORDER — ONDANSETRON HCL 4 MG/2ML IJ SOLN: 4.0000 mg | Freq: Four times a day (QID) | INTRAMUSCULAR | Status: DC | PRN

## 2019-07-08 NOTE — ED Notes (Signed)
Pt transported to CT ?

## 2019-07-08 NOTE — Progress Notes (Addendum)
ANTICOAGULATION CONSULT NOTE - Initial Consult  Pharmacy Consult for heparin Indication: pulmonary embolus  Allergies  Allergen Reactions  . Nebivolol Hcl     Other reaction(s): Other (See Comments) Chest tightness,sob,cough,severe tiredness,confusion  . Codeine Nausea Only  . Sulfa Antibiotics Nausea Only  . Sulfonamide Derivatives Nausea Only    Patient Measurements: Height: 5' 8"  (172.7 cm) Weight: 123 lb (55.8 kg) IBW/kg (Calculated) : 63.9 Heparin Dosing Weight: 55.8 kg  Vital Signs: Temp: 99 F (37.2 C) (03/20 1945) Temp Source: Oral (03/20 1945) BP: 113/55 (03/20 2130) Pulse Rate: 74 (03/20 2130)  Labs: Recent Labs    07/08/19 2018 07/08/19 2156  HGB 12.0  --   HCT 38.7  --   PLT 239  --   CREATININE 0.73  --   TROPONINIHS 6 6    Estimated Creatinine Clearance: 46.1 mL/min (by C-G formula based on SCr of 0.73 mg/dL).   Medical History: Past Medical History:  Diagnosis Date  . Cancer ([place])   . CHF (congestive heart failure) ([facility])    Amyloidosis  . Diarrhea   . Diverticulosis of colon (without mention of hemorrhage)   . Edema   . Esophagitis, unspecified   . Family history of malignant neoplasm of gastrointestinal tract   . Fibrosis of lung ([place]) 03/14/2013  . Flatulence, eructation, and gas pain   . GERD (gastroesophageal reflux disease)   . Left sided ulcerative colitis ([place])   . Maxillary sinus mass   . Osteoporosis, unspecified   . Other and unspecified hyperlipidemia   . Other and unspecified noninfectious gastroenteritis and colitis(558.9)   . Stricture and stenosis of esophagus   . Unspecified hypothyroidism   . URI (upper respiratory infection)     Medications:  See medication history  Assessment: 86 yo lady to start heparin for PE.  She was not on anticoagulation PTA.  Will dose slightly lower than protocol given advanced age and small body weight. Goal of Therapy:  Heparin level 0.3-0.7 units/ml Monitor platelets by  anticoagulation protocol: Yes   Plan:  Heparin bolus 4000 units and drip at 900 units/hr Check heparin level 6-8 hours after start  Lesta Limbert Poteet 07/08/2019,10:47 PM  Addum:  She was taking apixaban PTA.  Will add aPTT to am labs

## 2019-07-08 NOTE — ED Triage Notes (Signed)
Pt c/o chest pain started around 2 hours ago. Tried taking Tums and Nexium and either one helped. Said pain is in med-left chest. Does have Pacemaker. Hx of CHF and CA, Thinks its gas but she wanted to be sure.

## 2019-07-08 NOTE — H&P (Signed)
History and Physical    Cindy [name] ZOX:[id number] DOB: 05/03/1932 DOA: 07/08/2019  PCP: Celene Squibb, MD   Patient coming from: Home.  I have personally briefly reviewed patient's old medical records in [place]  Chief Complaint: Chest pain.  HPI: Cindy [name] is a 86 y.o. female with medical history significant of cancer, chronic diastolic CHF, amyloidosis, diarrhea, diverticulosis, esophagitis, GERD, stricturing a stenosis of esophagus, hypothyroidism, flatulence, left-sided ulcerative colitis, maxillary sinus mass, fibrosis of the lungs, osteoporosis, hyperlipidemia who is coming to the emergency department due to left-sided chest pain that started 2 hours prior to arrival to the emergency department.  The pain has sudden onset.  It was nonradiated, sharp associated with significant dyspnea.  She denies palpitations, dizziness, diaphoresis, PND or orthopnea.   She has unilateral left lower extremity edema, which was checked for DVT with ultrasound on June 27, 2019 with negative results.  She thought that she was having GERD related symptoms took multiple Tums tablet and while her Nexium 40 mg capsules without significant relief.  She denies fever, chills, rhinorrhea, sore throat, wheezing or hemoptysis.  No abdominal pain, diarrhea, constipation, melena or hematochezia.  Denies dysuria, frequency or hematuria.  No polyuria, polydipsia, polyphagia or blurred vision.  ED Course: Initial vital signs were temperature 99 F, pulse 78, respirations 17, blood pressure 132/64 mmHg and O2 sat 98% on room air.  The patient was given 324 mg of chewable aspirin and was started on a heparin infusion.  PCCM was contacted due to questionable right ventricle strain, but the patient does not meet criteria for transfer at this time.  CBC showed a white count of 7.0, hemoglobin 12.0 g/dL and platelets 239.  D-dimer was 0.88.  CMP showed a glucose of 104 mg/dL and total protein 6.2 g/dL, all  other values are within normal range.  Lipase and troponin x2 have been normal.  Imaging: Portable chest showed diffuse bilateral coarse interstitial opacities which could be due to acute interstitial edema or atypical infection versus progression of ILD.  CTA chest showed a pulmonary artery emboli involving the right main pulmonary artery extending into the lobar and segmental arteries of the right upper lobe and apical segment.  There is also additional small subsegmental pulmonary embolus of the apical posterior segment left upper lobe.  No elevation of the RV/LV rate you however there is right atrial enlargement, central pulmonary artery enlargement and reflux of contrast into the hepatic veins and IVC.  There is consolidative opacity in the right upper lung which may reflect an area of pulmonary infarct or infection/inflammatory consolidation.  Features of pulmonary edema on a background of chronic interstitial fibrosis.  There is mild cardiomegaly with compression of the right heart by a pectus deformity of the chest.  Please see images and full cardiology report for further detail.  Review of Systems: As per HPI otherwise 10 point review of systems negative.   Past Medical History:  Diagnosis Date  . Cancer ([place])   . CHF (congestive heart failure) (HCC)    Amyloidosis  . Diarrhea   . Diverticulosis of colon (without mention of hemorrhage)   . Edema   . Esophagitis, unspecified   . Family history of malignant neoplasm of gastrointestinal tract   . Fibrosis of lung ([place]) 03/14/2013  . Flatulence, eructation, and gas pain   . GERD (gastroesophageal reflux disease)   . Left sided ulcerative colitis ([place])   . Maxillary sinus mass   . Osteoporosis, unspecified   .  Other and unspecified hyperlipidemia   . Other and unspecified noninfectious gastroenteritis and colitis(558.9)   . Stricture and stenosis of esophagus   . Unspecified hypothyroidism   . URI (upper respiratory infection)      Past Surgical History:  Procedure Laterality Date  . ABDOMINAL HYSTERECTOMY    . CHOLECYSTECTOMY    . FOOT SURGERY    . THYROIDECTOMY, PARTIAL    . YAG LASER APPLICATION Right [id number]   Procedure: YAG LASER APPLICATION;  Surgeon: Elta Guadeloupe T. Gershon Crane, MD;  Location: [facility];  Service: Ophthalmology;  Laterality: Right;     reports that she has never smoked. She has never used smokeless tobacco. She reports that she does not drink alcohol or use drugs.  Allergies  Allergen Reactions  . Nebivolol Hcl     Other reaction(s): Other (See Comments) Chest tightness,sob,cough,severe tiredness,confusion  . Codeine Nausea Only  . Sulfa Antibiotics Nausea Only  . Sulfonamide Derivatives Nausea Only    Family History  Problem Relation Age of Onset  . Colon cancer Mother   . Heart attack Father        CHF  . Stomach cancer Sister   . Diabetes Son   . Kidney cancer Son   . Adrenal disorder Son   . Heart disease Brother    Prior to Admission medications   Medication Sig Start Date End Date Taking? Authorizing Provider  acyclovir (ZOVIRAX) 400 MG tablet Take 400 mg by mouth 2 (two) times daily. 06/19/19  Yes [provider]  esomeprazole (NEXIUM) 40 MG capsule Take 40 mg by mouth at bedtime. 05/25/19  Yes [provider]  fentaNYL (DURAGESIC) 25 MCG/HR Place 1 patch onto the skin every 3 (three) days. 06/15/19  Yes [provider]  levothyroxine (SYNTHROID) 100 MCG tablet Take 100 mcg by mouth every morning. 05/30/19  Yes [provider]  Potassium Chloride ER 20 MEQ TBCR Take 1 tablet by mouth daily. 04/12/19  Yes [provider]  torsemide (DEMADEX) 20 MG tablet Take 20 mg by mouth daily. 03/28/19  Yes [provider]  acetaminophen (TYLENOL) 325 MG tablet Take 325 mg by mouth as needed for mild pain or headache.    [provider]  ALPRAZolam Duanne Moron) 0.5 MG tablet Take 1 tablet (0.5 mg total) by mouth at bedtime as needed for  anxiety. 03/15/13   Robbie Lis, MD  aspirin 81 MG chewable tablet Chew by mouth.    [provider]  calcium carbonate (OS-CAL) 600 MG TABS tablet Take 600 mg by mouth daily with breakfast.    [provider]  cholecalciferol (VITAMIN D) 1000 UNITS tablet Take 1,000 Units by mouth daily.      [provider]  Magnesium Oxide 500 MG TABS Take 500 mg by mouth daily.     [provider]  mesalamine (LIALDA) 1.2 G EC tablet TAKE 2 TABLETS BY MOUTH DAILY WITH BREAKFAST. 10/02/14   Inda Castle, MD  metoprolol succinate (TOPROL-XL) 25 MG 24 hr tablet Take 1 tablet by mouth  daily Patient taking differently: Take 1 1/2 tablets morning and night. 09/21/14   Dorothy Spark, MD  sodium chloride (OCEAN) 0.65 % nasal spray     [provider]    Physical Exam: Vitals:   07/08/19 2030 07/08/19 2130 07/08/19 2300 07/08/19 2330  BP: 115/60 (!) 113/55 129/66 113/73  Pulse: 78 74 75 82  Resp: 19 20 18 19   Temp:      TempSrc:  SpO2: 98% 97% 96% 97%  Weight:      Height:        Constitutional: Frail, elderly female.  In NAD, calm, comfortable Eyes: PERRL, lids and conjunctivae normal ENMT: Mucous membranes are moist. Posterior pharynx clear of any exudate or lesions. Neck: normal, supple, no masses, no thyromegaly Respiratory: clear to auscultation bilaterally, no wheezing, no crackles. Normal respiratory effort. No accessory muscle use.  Cardiovascular: Regular rate and rhythm, no murmurs / rubs / gallops.  1+ lower extremities pitting edema. 2+ pedal pulses. No carotid bruits.  Abdomen: Nondistended.  BS positive.  Soft, no tenderness, no masses palpated. No hepatosplenomegaly. Musculoskeletal: no clubbing / cyanosis.  Good ROM, no contractures. Normal muscle tone.  Skin: no clinically significant rashes, lesions, ulcers on very limited dermatological examination. Neurologic: CN 2-12 grossly intact. Sensation intact, DTR normal. Strength 5/5  in all 4.  Psychiatric: Normal judgment and insight. Alert and oriented x 3. Normal mood.   Labs on Admission: I have personally reviewed following labs and imaging studies  CBC: Recent Labs  Lab 07/08/19 2018  WBC 7.0  HGB 12.0  HCT 38.7  MCV 93.3  PLT 680   Basic Metabolic Panel: Recent Labs  Lab 07/08/19 2018  NA 136  K 3.9  CL 100  CO2 26  GLUCOSE 104*  BUN 15  CREATININE 0.73  CALCIUM 9.2   GFR: Estimated Creatinine Clearance: 46.1 mL/min (by C-G formula based on SCr of 0.73 mg/dL). Liver Function Tests: Recent Labs  Lab 07/08/19 2018  AST 18  ALT 11  ALKPHOS 106  BILITOT 0.6  PROT 6.2*  ALBUMIN 3.8   Recent Labs  Lab 07/08/19 2018  LIPASE 31   No results for input(s): AMMONIA in the last 168 hours. Coagulation Profile: No results for input(s): INR, PROTIME in the last 168 hours. Cardiac Enzymes: No results for input(s): CKTOTAL, CKMB, CKMBINDEX, TROPONINI in the last 168 hours. BNP (last 3 results) No results for input(s): PROBNP in the last 8760 hours. HbA1C: No results for input(s): HGBA1C in the last 72 hours. CBG: Recent Labs  Lab 07/08/19 2042  GLUCAP 89   Lipid Profile: No results for input(s): CHOL, HDL, LDLCALC, TRIG, CHOLHDL, LDLDIRECT in the last 72 hours. Thyroid Function Tests: No results for input(s): TSH, T4TOTAL, FREET4, T3FREE, THYROIDAB in the last 72 hours. Anemia Panel: No results for input(s): VITAMINB12, FOLATE, FERRITIN, TIBC, IRON, RETICCTPCT in the last 72 hours. Urine analysis:    Component Value Date/Time   COLORURINE YELLOW 03/13/2013 2208   APPEARANCEUR CLEAR 03/13/2013 2208   LABSPEC 1.016 03/13/2013 2208   PHURINE 7.0 03/13/2013 2208   GLUCOSEU NEGATIVE 03/13/2013 2208   HGBUR NEGATIVE 03/13/2013 2208   BILIRUBINUR NEGATIVE 03/13/2013 2208   KETONESUR NEGATIVE 03/13/2013 2208   PROTEINUR NEGATIVE 03/13/2013 2208   UROBILINOGEN 0.2 03/13/2013 2208   NITRITE NEGATIVE 03/13/2013 2208   LEUKOCYTESUR  NEGATIVE 03/13/2013 2208    Radiological Exams on Admission: CT Angio Chest PE W and/or Wo Contrast  Result Date: 07/08/2019 CLINICAL DATA:  Chest pain began 5 hours prior EXAM: CT ANGIOGRAPHY CHEST WITH CONTRAST TECHNIQUE: Multidetector CT imaging of the chest was performed using the standard protocol during bolus administration of intravenous contrast. Multiplanar CT image reconstructions and MIPs were obtained to evaluate the vascular anatomy. CONTRAST:  147m OMNIPAQUE IOHEXOL 350 MG/ML SOLN COMPARISON:  CT 05/10/2014 FINDINGS: Cardiovascular: Satisfactory opacification of the pulmonary arteries. While there is extensive streak in the right upper lobe secondary to the retained  contrast bolus in the superior vena cava, hypoattenuating filling defect extending from right main pulmonary artery into the the right upper lobar and posterior segmental pulmonary arteries of the right upper lobe appear to be a true finding rather than artifactual (5/130-114). Small cysts subsegmental pulmonary embolus seen in the apicoposterior segment left upper lobe (5/103). No other hypoattenuating pulmonary arterial filling defects are identified.borderline dilatation of the left and right main pulmonary arteries. Chronic appearing dilatation of the right ventricle similar to prior with reflux of contrast into the IVC and hepatic veins. No elevation of the RV/LV ratio is seen. Mild cardiomegaly with compression of the right heart by a pectus deformity of the chest (Haller index of 3.4). No pericardial effusion. Coronary artery calcifications are noted. Pacer leads present at the right atrium and cardiac apex. Pacer pack in the left chest wall soft tissues. Atherosclerotic plaque within the normal caliber aorta. Normal 3 vessel branching of the aortic arch. Suboptimal opacification of the aorta and proximal great vessels limit luminal evaluation. Mediastinum/Nodes: Numerous low-attenuation, subcentimeter mediastinal hilar nodes  are present, favor reactive. No pathologically enlarged mediastinal, hilar or axillary adenopathy. No acute abnormality of the trachea. Small hiatal hernia. Absence of the right thyroid lobe, correlate for prior surgery or ablation. No abnormality of the left lobe thyroid. Lungs/Pleura: Diffuse fine reticular opacities in the lung periphery likely reflecting some underlying interstitial fibrosis there is diffuse interlobular septal thickening with superimposed areas of hazy ground-glass opacity favoring developing pulmonary edema. Consolidative opacity in the right upper lung could reflect an area of pulmonary infarct or infectious/inflammatory consolidation particularly given the presence of other consolidative opacities in the lingula and right lower lobe. Chronic right pleural effusion, similar in size to the comparison study. Upper Abdomen: Much of the abdomen is collimated from cross-sectional imaging. No acute abnormality seen in the upper abdomen aside from reflux of contrast. Musculoskeletal: The osseous structures appear diffusely demineralized which may limit detection of small or nondisplaced fractures. No chest wall mass or suspicious bone lesions identified. Benign-appearing macro calcifications in the breasts similar to priors. Circumferential body wall edema. Review of the MIP images confirms the above findings. IMPRESSION: 1. Pulmonary artery emboli involving the right main pulmonary artery extending into the lobar and segmental arteries of the right upper lobe and apical segment. Additional small subsegmental pulmonary embolus of the apicoposterior segment left upper lobe. 2. No elevation of the RV/LV ratio. However, there is right atrial enlargement, central pulmonary artery enlargement, and reflux of contrast into the hepatic veins and IVC which could be seen either with some degree of right heart strain or chronic elevated right heart pressures. 3. Consolidative opacity in the right upper lung  may reflect an area of pulmonary infarct or infectious/inflammatory consolidation particularly given the presence of other consolidative opacities in the lingula and right lower lobe. Consider clinical exclusion underlying atypical infectious etiologies including COVID-19. 4. Features of pulmonary edema on a background of chronic interstitial fibrosis as well. Chronic right effusion. 5. Mild cardiomegaly with compression of the right heart by a pectus deformity of the chest (Haller index of 3.4). 6. Coronary artery calcifications. 7. Aortic Atherosclerosis (ICD10-I70.0). These results were called by telephone at the time of interpretation on 07/08/2019 at 10:37 pm to provider JULIE IDOL , who verbally acknowledged these results. Electronically Signed   By: Lovena Le M.D.   On: 07/08/2019 22:37   DG Chest Portable 1 View  Result Date: 07/08/2019 CLINICAL DATA:  Chest pain. History of CHF. EXAM:  PORTABLE CHEST 1 VIEW COMPARISON:  Chest radiograph October 25, 2014 FINDINGS: Stable cardiomediastinal contours with borderline enlarged heart size. Left chest pacer leads terminate in the right atrium and right ventricle. There are diffuse bilateral coarse interstitial opacities no focal consolidation. Trace right pleural fluid. No pneumothorax. No acute finding in the visualized skeleton. IMPRESSION: Diffuse bilateral coarse interstitial opacities which may reflect interstitial edema or atypical infection in the acute setting, versus progression of chronic lung disease. Trace right pleural effusion. Electronically Signed   By: Audie Pinto M.D.   On: 07/08/2019 20:41    EKG: Independently reviewed.  Vent. rate 75 BPM PR interval * ms QRS duration 108 ms QT/QTc 414/463 ms P-R-T axes 13 -66 85 Sinus rhythm Borderline prolonged PR interval Left anterior fascicular block Anterior infarct, old No significant change when compared to previous ECG.  Assessment/Plan Principal Problem:   Pulmonary embolism  ([place]) Admit to stepdown/inpatient. Continue supplemental oxygen. Continue heparin infusion. Hold Eliquis at this time. Check BNP. Check echocardiogram. She had a recent negative LLE venous Doppler US.  Active Problems:   Paroxysmal atrial fibrillation (HCC) CHA?DS?-VASc Score of at least 5. Holding Eliquis while on heparin infusion. Continue metoprolol for rate control.    Hypothyroidism Continue levothyroxine 100 mcg p.o. daily.    Hyperlipidemia Not on medical therapy. Continue lifestyle modifications.    GERD (gastroesophageal reflux disease) Continue PPI.    Left sided ulcerative colitis ([place]) No significant symptoms now. No longer taking mesalamine.    Interstitial pneumonitis ([facility]) Has history of amiodarone therapy for A. fib. Currently on oxygen. Bronchodilators as needed.    Hypertension Continue metoprolol ER 37.5 mg p.o. daily. Continue torsemide 20 mg p.o. daily. Monitor BP, HR, renal function electrolytes.    Chronic diastolic heart failure ([facility]) History of cardiac amyloidosis. Continue beta-blocker. Continue torsemide and potassium supplementation.     DVT prophylaxis: On heparin infusion. Code Status: Full code. Family Communication: Disposition Plan: Admit for PE treatment and work-up for 2 to 3 days. Consults called: Admission status: Inpatient/stepdown.   Reubin Milan MD [facility]  If 7PM-7AM, please contact night-coverage [email or web address]  07/08/2019, 11:58 PM   This document was prepared using Dragon voice recognition software and may contain some unintended transcription errors.

## 2019-07-08 NOTE — ED Provider Notes (Addendum)
[facility] EMERGENCY DEPARTMENT Provider Note   CSN: [id number] Arrival date & time: 07/08/19  4270     History Chief Complaint  Patient presents with  . Chest Pain    Cindy [name] is a 86 y.o. female with a history of cardiac amyloidosis followed at [facility] every 3 months who is not on any current chemotherapy treatment, CHF, history of esophagitis, lung fibrosis, GERD presenting with sudden onset of left sided chest pain occurring 2 hours prior to arrival.  She was resting in her home when she had sharp stabbing pain in her left chest with radiation into her lateral axillary and shoulder region.  Her pain was initially 10 out of 10, severe associated with shortness of breath.  She initially thought her symptoms were GERD related, took several Tums tablets, followed this with Nexium which did not improve her symptoms initially, but currently she feels improved, now 5 out of 10 and without shortness of breath.  She reports having chronic left extremity edema and states her oncologist at [facility] had a DVT ultrasound completed last week for this which was negative.  Review of her chart at [facility] with a negative DVT study from March 9.  The history is provided by the patient.  Chest Pain Associated symptoms: nausea   Associated symptoms: no abdominal pain, no dizziness, no fever, no headache, no numbness, no shortness of breath, no vomiting and no weakness        Past Medical History:  Diagnosis Date  . Cancer ([place])   . CHF (congestive heart failure) (HCC)    Amyloidosis  . Diarrhea   . Diverticulosis of colon (without mention of hemorrhage)   . Edema   . Esophagitis, unspecified   . Family history of malignant neoplasm of gastrointestinal tract   . Fibrosis of lung ([employer]) 03/14/2013  . Flatulence, eructation, and gas pain   . GERD (gastroesophageal reflux disease)   . Left sided ulcerative colitis ([place])   . Maxillary sinus mass   . Osteoporosis, unspecified   . Other and  unspecified hyperlipidemia   . Other and unspecified noninfectious gastroenteritis and colitis(558.9)   . Stricture and stenosis of esophagus   . Unspecified hypothyroidism   . URI (upper respiratory infection)     Patient Active Problem List   Diagnosis Date Noted  . Cardiac amyloidosis ([place]) 10/27/2014  . Chest pain 10/25/2014  . Acute renal failure ([place]) 10/25/2014  . Hyponatremia 10/25/2014  . Hypotension 10/25/2014  . Acute on chronic diastolic heart failure ([place]) 10/25/2014  . Malnutrition of moderate degree ([place]) 10/25/2014  . Change in bowel habits 08/20/2014  . Cardiac failure ([place]) 06/26/2014  . ILD (interstitial lung disease) ([place]) 06/26/2014  . Upper airway cough syndrome 04/21/2014  . Dyspnea 04/17/2014  . Sinus tachycardia 06/26/2013  . Acute diastolic heart failure ([place]) 03/28/2013  . Bronchitis 03/14/2013  . Chest tightness 03/14/2013  . Interstitial pneumonitis ([place]) 03/14/2013  . Fibrosis of lung ([place]) 03/14/2013  . Family history of malignant neoplasm of gastrointestinal tract 05/06/2011  . STRICTURE AND STENOSIS OF ESOPHAGUS 05/27/2009  . OSTEOPOROSIS 10/16/2008  . EDEMA 10/16/2008  . Hypothyroidism 05/09/2007  . HYPERLIPIDEMIA 05/09/2007  . GERD 05/09/2007  . COLITIS 05/09/2007  . LEFT SIDED ULCERATIVE COLITIS 08/07/2005  . DIVERTICULOSIS, COLON 05/15/2004    Past Surgical History:  Procedure Laterality Date  . ABDOMINAL HYSTERECTOMY    . CHOLECYSTECTOMY    . FOOT SURGERY    . THYROIDECTOMY, PARTIAL    .  YAG LASER APPLICATION Right 3/84/5364   Procedure: YAG LASER APPLICATION;  Surgeon: Elta Guadeloupe T. Gershon Crane, MD;  Location: [facility];  Service: Ophthalmology;  Laterality: Right;     OB History    Gravida  3   Para  2   Term  2   Preterm      AB  1   Living        SAB  1   TAB      Ectopic      Multiple      Live Births              Family History  Problem Relation Age of Onset  . Colon cancer Mother   . Heart attack Father          CHF  . Stomach cancer Sister   . Diabetes Son   . Kidney cancer Son   . Adrenal disorder Son   . Heart disease Brother     Social History   Tobacco Use  . Smoking status: Never Smoker  . Smokeless tobacco: Never Used  Substance Use Topics  . Alcohol use: No    Alcohol/week: 0.0 standard drinks  . Drug use: No    Home Medications Prior to Admission medications   Medication Sig Start Date End Date Taking? Authorizing Provider  acyclovir (ZOVIRAX) 400 MG tablet Take 400 mg by mouth 2 (two) times daily. 06/19/19  Yes [provider]  esomeprazole (NEXIUM) 40 MG capsule Take 40 mg by mouth at bedtime. 05/25/19  Yes [provider]  fentaNYL (DURAGESIC) 25 MCG/HR Place 1 patch onto the skin every 3 (three) days. 06/15/19  Yes [provider]  levothyroxine (SYNTHROID) 100 MCG tablet Take 100 mcg by mouth every morning. 05/30/19  Yes [provider]  Potassium Chloride ER 20 MEQ TBCR Take 1 tablet by mouth daily. 04/12/19  Yes [provider]  torsemide (DEMADEX) 20 MG tablet Take 20 mg by mouth daily. 03/28/19  Yes [provider]  acetaminophen (TYLENOL) 325 MG tablet Take 325 mg by mouth as needed for mild pain or headache.    [provider]  ALPRAZolam Duanne Moron) 0.5 MG tablet Take 1 tablet (0.5 mg total) by mouth at bedtime as needed for anxiety. 03/15/13   Robbie Lis, MD  aspirin 81 MG chewable tablet Chew by mouth.    [provider]  calcium carbonate (OS-CAL) 600 MG TABS tablet Take 600 mg by mouth daily with breakfast.    [provider]  cholecalciferol (VITAMIN D) 1000 UNITS tablet Take 1,000 Units by mouth daily.      [provider]  Magnesium Oxide 500 MG TABS Take 500 mg by mouth daily.     [provider]  mesalamine (LIALDA) 1.2 G EC tablet TAKE 2 TABLETS BY MOUTH DAILY WITH BREAKFAST. 10/02/14   Inda Castle, MD  metoprolol succinate (TOPROL-XL) 25 MG 24 hr tablet Take  1 tablet by mouth  daily Patient taking differently: Take 1 1/2 tablets morning and night. 09/21/14   Dorothy Spark, MD  sodium chloride (OCEAN) 0.65 % nasal spray     [provider]    Allergies    Nebivolol hcl, Codeine, Sulfa antibiotics, and Sulfonamide derivatives  Review of Systems   Review of Systems  Constitutional: Negative for chills and fever.  HENT: Negative for congestion and sore throat.   Eyes: Negative.   Respiratory: Negative for chest tightness and shortness of breath.  Cardiovascular: Positive for chest pain and leg swelling.  Gastrointestinal: Positive for nausea. Negative for abdominal pain and vomiting.  Genitourinary: Negative.   Musculoskeletal: Negative for arthralgias, joint swelling and neck pain.  Skin: Negative.  Negative for rash and wound.  Neurological: Negative for dizziness, weakness, light-headedness, numbness and headaches.  Psychiatric/Behavioral: Negative.     Physical Exam Updated Vital Signs BP 129/66   Pulse 75   Temp 99 F (37.2 C) (Oral)   Resp 18   Ht 5' 8"  (1.727 m)   Wt 55.8 kg   SpO2 96%   BMI 18.70 kg/m   Physical Exam Vitals and nursing note reviewed.  Constitutional:      Appearance: She is well-developed.  HENT:     Head: Normocephalic and atraumatic.  Eyes:     Conjunctiva/sclera: Conjunctivae normal.  Cardiovascular:     Rate and Rhythm: Normal rate and regular rhythm.     Heart sounds: Normal heart sounds.  Pulmonary:     Effort: Pulmonary effort is normal. No tachypnea, accessory muscle usage or respiratory distress.     Breath sounds: Normal breath sounds. No decreased breath sounds or wheezing.  Abdominal:     General: Bowel sounds are normal.     Palpations: Abdomen is soft.     Tenderness: There is no abdominal tenderness.  Musculoskeletal:        General: Normal range of motion.     Cervical back: Normal range of motion.     Left lower leg: No tenderness. Edema present.  Skin:     General: Skin is warm and dry.  Neurological:     Mental Status: She is alert.     ED Results / Procedures / Treatments   Labs (all labs ordered are listed, but only abnormal results are displayed) Labs Reviewed  CBC - Abnormal; Notable for the following components:      Result Value   RDW 16.1 (*)    All other components within normal limits  COMPREHENSIVE METABOLIC PANEL - Abnormal; Notable for the following components:   Glucose, Bld 104 (*)    Total Protein 6.2 (*)    All other components within normal limits  D-DIMER, QUANTITATIVE (NOT AT [facility]) - Abnormal; Notable for the following components:   D-Dimer, Quant 0.88 (*)    All other components within normal limits  RESPIRATORY PANEL BY PCR  LIPASE, BLOOD  CBG MONITORING, ED  TROPONIN I (HIGH SENSITIVITY)  TROPONIN I (HIGH SENSITIVITY)    EKG EKG Interpretation  Date/Time:  Saturday July 08 2019 19:44:12 EDT Ventricular Rate:  75 PR Interval:    QRS Duration: 108 QT Interval:  414 QTC Calculation: 463 R Axis:   -66 Text Interpretation: Sinus rhythm Borderline prolonged PR interval Left anterior fascicular block Anterior infarct, old No significant change since last tracing Confirmed by Orpah Greek [phone]) on 07/08/2019 11:58:50 PM   Radiology CT Angio Chest PE W and/or Wo Contrast  Result Date: 07/08/2019 CLINICAL DATA:  Chest pain began 5 hours prior EXAM: CT ANGIOGRAPHY CHEST WITH CONTRAST TECHNIQUE: Multidetector CT imaging of the chest was performed using the standard protocol during bolus administration of intravenous contrast. Multiplanar CT image reconstructions and MIPs were obtained to evaluate the vascular anatomy. CONTRAST:  176m OMNIPAQUE IOHEXOL 350 MG/ML SOLN COMPARISON:  CT 05/10/2014 FINDINGS: Cardiovascular: Satisfactory opacification of the pulmonary arteries. While there is extensive streak in the right upper lobe secondary to the retained contrast bolus in the superior vena cava,  hypoattenuating  filling defect extending from right main pulmonary artery into the the right upper lobar and posterior segmental pulmonary arteries of the right upper lobe appear to be a true finding rather than artifactual (5/130-114). Small cysts subsegmental pulmonary embolus seen in the apicoposterior segment left upper lobe (5/103). No other hypoattenuating pulmonary arterial filling defects are identified.borderline dilatation of the left and right main pulmonary arteries. Chronic appearing dilatation of the right ventricle similar to prior with reflux of contrast into the IVC and hepatic veins. No elevation of the RV/LV ratio is seen. Mild cardiomegaly with compression of the right heart by a pectus deformity of the chest (Haller index of 3.4). No pericardial effusion. Coronary artery calcifications are noted. Pacer leads present at the right atrium and cardiac apex. Pacer pack in the left chest wall soft tissues. Atherosclerotic plaque within the normal caliber aorta. Normal 3 vessel branching of the aortic arch. Suboptimal opacification of the aorta and proximal great vessels limit luminal evaluation. Mediastinum/Nodes: Numerous low-attenuation, subcentimeter mediastinal hilar nodes are present, favor reactive. No pathologically enlarged mediastinal, hilar or axillary adenopathy. No acute abnormality of the trachea. Small hiatal hernia. Absence of the right thyroid lobe, correlate for prior surgery or ablation. No abnormality of the left lobe thyroid. Lungs/Pleura: Diffuse fine reticular opacities in the lung periphery likely reflecting some underlying interstitial fibrosis there is diffuse interlobular septal thickening with superimposed areas of hazy ground-glass opacity favoring developing pulmonary edema. Consolidative opacity in the right upper lung could reflect an area of pulmonary infarct or infectious/inflammatory consolidation particularly given the presence of other consolidative opacities in the  lingula and right lower lobe. Chronic right pleural effusion, similar in size to the comparison study. Upper Abdomen: Much of the abdomen is collimated from cross-sectional imaging. No acute abnormality seen in the upper abdomen aside from reflux of contrast. Musculoskeletal: The osseous structures appear diffusely demineralized which may limit detection of small or nondisplaced fractures. No chest wall mass or suspicious bone lesions identified. Benign-appearing macro calcifications in the breasts similar to priors. Circumferential body wall edema. Review of the MIP images confirms the above findings. IMPRESSION: 1. Pulmonary artery emboli involving the right main pulmonary artery extending into the lobar and segmental arteries of the right upper lobe and apical segment. Additional small subsegmental pulmonary embolus of the apicoposterior segment left upper lobe. 2. No elevation of the RV/LV ratio. However, there is right atrial enlargement, central pulmonary artery enlargement, and reflux of contrast into the hepatic veins and IVC which could be seen either with some degree of right heart strain or chronic elevated right heart pressures. 3. Consolidative opacity in the right upper lung may reflect an area of pulmonary infarct or infectious/inflammatory consolidation particularly given the presence of other consolidative opacities in the lingula and right lower lobe. Consider clinical exclusion underlying atypical infectious etiologies including COVID-19. 4. Features of pulmonary edema on a background of chronic interstitial fibrosis as well. Chronic right effusion. 5. Mild cardiomegaly with compression of the right heart by a pectus deformity of the chest (Haller index of 3.4). 6. Coronary artery calcifications. 7. Aortic Atherosclerosis (ICD10-I70.0). These results were called by telephone at the time of interpretation on 07/08/2019 at 10:37 pm to provider Dashea Mcmullan , who verbally acknowledged these results.  Electronically Signed   By: Lovena Le M.D.   On: 07/08/2019 22:37   DG Chest Portable 1 View  Result Date: 07/08/2019 CLINICAL DATA:  Chest pain. History of CHF. EXAM: PORTABLE CHEST 1 VIEW COMPARISON:  Chest radiograph  October 25, 2014 FINDINGS: Stable cardiomediastinal contours with borderline enlarged heart size. Left chest pacer leads terminate in the right atrium and right ventricle. There are diffuse bilateral coarse interstitial opacities no focal consolidation. Trace right pleural fluid. No pneumothorax. No acute finding in the visualized skeleton. IMPRESSION: Diffuse bilateral coarse interstitial opacities which may reflect interstitial edema or atypical infection in the acute setting, versus progression of chronic lung disease. Trace right pleural effusion. Electronically Signed   By: Audie Pinto M.D.   On: 07/08/2019 20:41    Procedures Procedures (including critical care time)  Medications Ordered in ED Medications  heparin ADULT infusion 100 units/mL (25000 units/238m sodium chloride 0.45%) (900 Units/hr Intravenous New Bag/Given 07/08/19 2259)  aspirin chewable tablet 324 mg (324 mg Oral Given 07/08/19 2034)  iohexol (OMNIPAQUE) 350 MG/ML injection 100 mL (100 mLs Intravenous Contrast Given 07/08/19 2210)  heparin bolus via infusion 4,000 Units (4,000 Units Intravenous Bolus from Bag 07/08/19 2300)    ED Course  I have reviewed the triage vital signs and the nursing notes.  Pertinent labs & imaging results that were available during my care of the patient were reviewed by me and considered in my medical decision making (see chart for details).    MDM Rules/Calculators/A&P                     Pt with acute PE. Discussed with Dr. OLucile Shuttersregarding CT findings and suggestion of possible right heart strain.  Pt does not meet criteria, can be admitted at [facility].   Discussed with Dr. OOlevia Bowenswho accepts pt for admission.    CRITICAL CARE Performed by: JEvalee JeffersonTotal critical care  time: 40 minutes Critical care time was exclusive of separately billable procedures and treating other patients. Critical care was necessary to treat or prevent imminent or life-threatening deterioration. Critical care was time spent personally by me on the following activities: development of treatment plan with patient and/or surrogate as well as nursing, discussions with consultants, evaluation of patient's response to treatment, examination of patient, obtaining history from patient or surrogate, ordering and performing treatments and interventions, ordering and review of laboratory studies, ordering and review of radiographic studies, pulse oximetry and re-evaluation of patient's condition.  Final Clinical Impression(s) / ED Diagnoses Final diagnoses:  Acute pulmonary embolism, unspecified pulmonary embolism type, unspecified whether acute cor pulmonale present ([facility]    Rx / DC Orders ED Discharge Orders    None       ILandis Martins03/20/21 2357    IEvalee Jefferson PA-C 07/08/19 2359    ZMilton Ferguson MD 07/11/19 1018

## 2019-07-09 ENCOUNTER — Inpatient Hospital Stay (HOSPITAL_COMMUNITY): Payer: Medicare Other

## 2019-07-09 ENCOUNTER — Encounter (HOSPITAL_COMMUNITY): Payer: Self-pay | Admitting: Internal Medicine

## 2019-07-09 DIAGNOSIS — I350 Nonrheumatic aortic (valve) stenosis: Secondary | ICD-10-CM

## 2019-07-09 DIAGNOSIS — I5032 Chronic diastolic (congestive) heart failure: Secondary | ICD-10-CM | POA: Diagnosis present

## 2019-07-09 DIAGNOSIS — J8489 Other specified interstitial pulmonary diseases: Secondary | ICD-10-CM

## 2019-07-09 DIAGNOSIS — I2699 Other pulmonary embolism without acute cor pulmonale: Secondary | ICD-10-CM

## 2019-07-09 DIAGNOSIS — I480 Paroxysmal atrial fibrillation: Secondary | ICD-10-CM

## 2019-07-09 DIAGNOSIS — K219 Gastro-esophageal reflux disease without esophagitis: Secondary | ICD-10-CM

## 2019-07-09 DIAGNOSIS — I10 Essential (primary) hypertension: Secondary | ICD-10-CM

## 2019-07-09 DIAGNOSIS — I361 Nonrheumatic tricuspid (valve) insufficiency: Secondary | ICD-10-CM

## 2019-07-09 DIAGNOSIS — E039 Hypothyroidism, unspecified: Secondary | ICD-10-CM

## 2019-07-09 HISTORY — DX: Paroxysmal atrial fibrillation: I48.0

## 2019-07-09 LAB — RESPIRATORY PANEL BY PCR

## 2019-07-09 LAB — ECHOCARDIOGRAM COMPLETE
Height: 68 in
Weight: 2024.7 oz

## 2019-07-09 LAB — PHOSPHORUS: Phosphorus: 4.4 mg/dL (ref 2.5–4.6)

## 2019-07-09 LAB — RESPIRATORY PANEL BY RT PCR (FLU A&B, COVID)
Influenza A by PCR: NEGATIVE
Influenza B by PCR: NEGATIVE
SARS Coronavirus 2 by RT PCR: NEGATIVE

## 2019-07-09 LAB — HEPARIN LEVEL (UNFRACTIONATED)
Heparin Unfractionated: 0.43 IU/mL (ref 0.30–0.70)
Heparin Unfractionated: 0.32 IU/mL (ref 0.30–0.70)

## 2019-07-09 LAB — PROTIME-INR
INR: 1.2 (ref 0.8–1.2)
Prothrombin Time: 15 seconds (ref 11.4–15.2)

## 2019-07-09 LAB — APTT
aPTT: 59 seconds — ABNORMAL HIGH (ref 24–36)
aPTT: 67 seconds — ABNORMAL HIGH (ref 24–36)

## 2019-07-09 LAB — MRSA PCR SCREENING: MRSA by PCR: NEGATIVE

## 2019-07-09 LAB — MAGNESIUM: Magnesium: 2 mg/dL (ref 1.7–2.4)

## 2019-07-09 LAB — BRAIN NATRIURETIC PEPTIDE: B Natriuretic Peptide: 142 pg/mL — ABNORMAL HIGH (ref 0.0–100.0)

## 2019-07-09 MED ORDER — ALPRAZOLAM 0.5 MG PO TABS
0.5000 mg | ORAL_TABLET | Freq: Every evening | ORAL | Status: DC | PRN
  Filled 2019-07-09: qty 1

## 2019-07-09 MED ORDER — CALCIUM CARBONATE 1250 (500 CA) MG PO TABS
1.0000 | ORAL_TABLET | Freq: Every day | ORAL | Status: DC
  Administered 2019-07-09 – 2019-07-10 (×2): 500 mg via ORAL
  Filled 2019-07-09 (×2): qty 1

## 2019-07-09 MED ORDER — TORSEMIDE 20 MG PO TABS
20.0000 mg | ORAL_TABLET | Freq: Every day | ORAL | Status: DC
  Administered 2019-07-09 – 2019-07-10 (×2): 20 mg via ORAL
  Filled 2019-07-09 (×2): qty 1

## 2019-07-09 MED ORDER — LEVOTHYROXINE SODIUM 100 MCG PO TABS
100.0000 ug | ORAL_TABLET | Freq: Every day | ORAL | Status: DC
  Administered 2019-07-09 – 2019-07-10 (×2): 100 ug via ORAL
  Filled 2019-07-09 (×2): qty 1

## 2019-07-09 MED ORDER — ALPRAZOLAM 0.25 MG PO TABS
0.2500 mg | ORAL_TABLET | Freq: Every day | ORAL | Status: DC
  Administered 2019-07-09 (×2): 0.25 mg via ORAL
  Filled 2019-07-09 (×2): qty 1

## 2019-07-09 MED ORDER — DOCUSATE SODIUM 100 MG PO CAPS
100.0000 mg | ORAL_CAPSULE | Freq: Two times a day (BID) | ORAL | Status: DC | PRN
  Administered 2019-07-09 – 2019-07-10 (×2): 100 mg via ORAL
  Filled 2019-07-09 (×2): qty 1

## 2019-07-09 MED ORDER — PANTOPRAZOLE SODIUM 40 MG PO TBEC
40.0000 mg | DELAYED_RELEASE_TABLET | Freq: Every day | ORAL | Status: DC
  Administered 2019-07-09 – 2019-07-10 (×2): 40 mg via ORAL
  Filled 2019-07-09 (×2): qty 1

## 2019-07-09 MED ORDER — VITAMIN D 25 MCG (1000 UNIT) PO TABS
1000.0000 [IU] | ORAL_TABLET | Freq: Every day | ORAL | Status: DC
  Administered 2019-07-09 – 2019-07-10 (×2): 1000 [IU] via ORAL
  Filled 2019-07-09 (×2): qty 1

## 2019-07-09 MED ORDER — METOPROLOL SUCCINATE ER 25 MG PO TB24
37.5000 mg | ORAL_TABLET | Freq: Every day | ORAL | Status: DC
  Administered 2019-07-09: 25 mg via ORAL
  Filled 2019-07-09: qty 2

## 2019-07-09 MED ORDER — CHLORHEXIDINE GLUCONATE CLOTH 2 % EX PADS
6.0000 | MEDICATED_PAD | Freq: Every day | CUTANEOUS | Status: DC
  Administered 2019-07-09 – 2019-07-10 (×2): 6 via TOPICAL

## 2019-07-09 MED ORDER — METOPROLOL SUCCINATE ER 25 MG PO TB24
25.0000 mg | ORAL_TABLET | Freq: Every morning | ORAL | Status: DC
  Administered 2019-07-10: 25 mg via ORAL
  Filled 2019-07-09: qty 1

## 2019-07-09 MED ORDER — METOPROLOL SUCCINATE ER 25 MG PO TB24
12.5000 mg | ORAL_TABLET | Freq: Every day | ORAL | Status: DC
  Administered 2019-07-09: 12.5 mg via ORAL
  Filled 2019-07-09: qty 1

## 2019-07-09 MED ORDER — ACETAMINOPHEN 500 MG PO TABS
500.0000 mg | ORAL_TABLET | Freq: Once | ORAL | Status: AC
  Administered 2019-07-09: 500 mg via ORAL
  Filled 2019-07-09: qty 1

## 2019-07-09 MED ORDER — FENTANYL 25 MCG/HR TD PT72
1.0000 | MEDICATED_PATCH | TRANSDERMAL | Status: DC
  Administered 2019-07-09: 1 via TRANSDERMAL
  Filled 2019-07-09: qty 1

## 2019-07-09 MED ORDER — POTASSIUM CHLORIDE CRYS ER 20 MEQ PO TBCR
20.0000 meq | EXTENDED_RELEASE_TABLET | Freq: Every day | ORAL | Status: DC
  Administered 2019-07-09 – 2019-07-10 (×2): 20 meq via ORAL
  Filled 2019-07-09 (×2): qty 1

## 2019-07-09 NOTE — Progress Notes (Signed)
ANTICOAGULATION CONSULT NOTE - Initial Consult  Pharmacy Consult for heparin Indication: pulmonary embolus  Allergies  Allergen Reactions  . Nebivolol Hcl     Other reaction(s): Other (See Comments) Chest tightness,sob,cough,severe tiredness,confusion  . Codeine Nausea Only  . Sulfa Antibiotics Nausea Only  . Sulfonamide Derivatives Nausea Only    Patient Measurements: Height: 5' 8"  (172.7 cm) Weight: 126 lb 8.7 oz (57.4 kg) IBW/kg (Calculated) : 63.9 Heparin Dosing Weight: 55.8 kg  Vital Signs: Temp: 97.7 F (36.5 C) (03/21 0744) Temp Source: Oral (03/21 0744) BP: 98/56 (03/21 0944) Pulse Rate: 78 (03/21 0944)  Labs: Recent Labs    07/08/19 2018 07/08/19 2156 07/09/19 0306 07/09/19 0814  HGB 12.0  --   --   --   HCT 38.7  --   --   --   PLT 239  --   --   --   APTT  --   --  59* 67*  LABPROT  --   --  15.0  --   INR  --   --  1.2  --   HEPARINUNFRC  --   --   --  0.43  CREATININE 0.73  --   --   --   TROPONINIHS 6 6  --   --     Estimated Creatinine Clearance: 47.4 mL/min (by C-G formula based on SCr of 0.73 mg/dL).   Medical History: Past Medical History:  Diagnosis Date  . Cancer ([place])   . CHF (congestive heart failure) (HCC)    Amyloidosis  . Diarrhea   . Diverticulosis of colon (without mention of hemorrhage)   . Edema   . Esophagitis, unspecified   . Family history of malignant neoplasm of gastrointestinal tract   . Fibrosis of lung ([place]) 03/14/2013  . Flatulence, eructation, and gas pain   . GERD (gastroesophageal reflux disease)   . Left sided ulcerative colitis ([place])   . Maxillary sinus mass   . Osteoporosis, unspecified   . Other and unspecified hyperlipidemia   . Other and unspecified noninfectious gastroenteritis and colitis(558.9)   . Paroxysmal atrial fibrillation ([place]) 07/09/2019  . Stricture and stenosis of esophagus   . Unspecified hypothyroidism   . URI (upper respiratory infection)     Medications:  See medication  history  Assessment: 86 yo lady to start heparin for PE.  She was not on anticoagulation PTA.  Will dose slightly lower than protocol given advanced age and small body weight.  HL 0.43 therapeutic  Goal of Therapy:  Heparin level 0.3-0.7 units/ml Monitor platelets by anticoagulation protocol: Yes   Plan:  Heparin drip at 900 units/hr Check heparin level in 8 hours then daily  Monitor for signs and symptoms of bleeding   Donna Christen Cindy [name] 07/09/2019,10:02 AM

## 2019-07-09 NOTE — Progress Notes (Addendum)
ANTICOAGULATION CONSULT NOTE - Initial Consult  Pharmacy Consult for heparin Indication: pulmonary embolus  Allergies  Allergen Reactions  . Nebivolol Hcl     Other reaction(s): Other (See Comments) Chest tightness,sob,cough,severe tiredness,confusion  . Codeine Nausea Only  . Sulfa Antibiotics Nausea Only  . Sulfonamide Derivatives Nausea Only    Patient Measurements: Height: 5' 8"  (172.7 cm) Weight: 126 lb 8.7 oz (57.4 kg) IBW/kg (Calculated) : 63.9 Heparin Dosing Weight: 55.8 kg  Vital Signs: Temp: 98.3 F (36.8 C) (03/21 1600) Temp Source: Oral (03/21 1600) BP: 93/59 (03/21 1746) Pulse Rate: 83 (03/21 1746)  Labs: Recent Labs    07/08/19 2018 07/08/19 2156 07/09/19 0306 07/09/19 0814 07/09/19 1701  HGB 12.0  --   --   --   --   HCT 38.7  --   --   --   --   PLT 239  --   --   --   --   APTT  --   --  59* 67*  --   LABPROT  --   --  15.0  --   --   INR  --   --  1.2  --   --   HEPARINUNFRC  --   --   --  0.43 0.32  CREATININE 0.73  --   --   --   --   TROPONINIHS 6 6  --   --   --     Estimated Creatinine Clearance: 47.4 mL/min (by C-G formula based on SCr of 0.73 mg/dL).   Medical History: Past Medical History:  Diagnosis Date  . Cancer ([place])   . CHF (congestive heart failure) (HCC)    Amyloidosis  . Diarrhea   . Diverticulosis of colon (without mention of hemorrhage)   . Edema   . Esophagitis, unspecified   . Family history of malignant neoplasm of gastrointestinal tract   . Fibrosis of lung ([place]) 03/14/2013  . Flatulence, eructation, and gas pain   . GERD (gastroesophageal reflux disease)   . Left sided ulcerative colitis ([place])   . Maxillary sinus mass   . Osteoporosis, unspecified   . Other and unspecified hyperlipidemia   . Other and unspecified noninfectious gastroenteritis and colitis(558.9)   . Paroxysmal atrial fibrillation ([place]) 07/09/2019  . Stricture and stenosis of esophagus   . Unspecified hypothyroidism   . URI (upper  respiratory infection)     Medications:  See medication history  Assessment: 86 yo lady to start heparin for PE.  She was not on anticoagulation PTA.  Will dose slightly lower than protocol given advanced age and small body weight.  HL 0.32 therapeutic  Goal of Therapy:  Heparin level 0.3-0.7 units/ml Monitor platelets by anticoagulation protocol: Yes   Plan:  Continue Heparin drip at 900 units/hr Check heparin level daily  Check CBC daily  Monitor for signs and symptoms of bleeding   Cindy [name] Cindy [name] 07/09/2019,6:14 PM

## 2019-07-09 NOTE — Progress Notes (Signed)
PROGRESS NOTE    Cindy [name]  ZOX:[id number] DOB: 10-02-1932 DOA: 07/08/2019 PCP: Celene Squibb, MD     Brief Narrative:  As per H&P written by Dr. Olevia Bowens on 07/08/2019 86 y.o. female with medical history significant of cancer, chronic diastolic CHF, amyloidosis, diarrhea, diverticulosis, esophagitis, GERD, stricturing a stenosis of esophagus, hypothyroidism, flatulence, left-sided ulcerative colitis, maxillary sinus mass, fibrosis of the lungs, osteoporosis, hyperlipidemia who is coming to the emergency department due to left-sided chest pain that started 2 hours prior to arrival to the emergency department.  The pain has sudden onset.  It was nonradiated, sharp associated with significant dyspnea.  She denies palpitations, dizziness, diaphoresis, PND or orthopnea.   She has unilateral left lower extremity edema, which was checked for DVT with ultrasound on June 27, 2019 with negative results.  She thought that she was having GERD related symptoms took multiple Tums tablet and while her Nexium 40 mg capsules without significant relief.  She denies fever, chills, rhinorrhea, sore throat, wheezing or hemoptysis.  No abdominal pain, diarrhea, constipation, melena or hematochezia.  Denies dysuria, frequency or hematuria.  No polyuria, polydipsia, polyphagia or blurred vision.  ED Course: Initial vital signs were temperature 99 F, pulse 78, respirations 17, blood pressure 132/64 mmHg and O2 sat 98% on room air.  The patient was given 324 mg of chewable aspirin and was started on a heparin infusion.  PCCM was contacted due to questionable right ventricle strain, but the patient does not meet criteria for transfer at this time.  CBC showed a white count of 7.0, hemoglobin 12.0 g/dL and platelets 239.  D-dimer was 0.88.  CMP showed a glucose of 104 mg/dL and total protein 6.2 g/dL, all other values are within normal range.  Lipase and troponin x2 have been normal.  Imaging: Portable chest showed  diffuse bilateral coarse interstitial opacities which could be due to acute interstitial edema or atypical infection versus progression of ILD.  CTA chest showed a pulmonary artery emboli involving the right main pulmonary artery extending into the lobar and segmental arteries of the right upper lobe and apical segment.  There is also additional small subsegmental pulmonary embolus of the apical posterior segment left upper lobe.  No elevation of the RV/LV rate you however there is right atrial enlargement, central pulmonary artery enlargement and reflux of contrast into the hepatic veins and IVC.  There is consolidative opacity in the right upper lung which may reflect an area of pulmonary infarct or infection/inflammatory consolidation.  Features of pulmonary edema on a background of chronic interstitial fibrosis.  There is mild cardiomegaly with compression of the right heart by a pectus deformity of the chest.  Please see images and full cardiology report for further detail.  Assessment & Plan: 1-Pulmonary embolism (HCC) -Currently denies significant chest discomfort and demonstrate good oxygen saturation on room air -Continue heparin drip to complete 24 hours -Follow 2D echo and clinical response -If remains hemodynamically stable will be able to be discharged home on oral anticoagulation as early as on 07/10/2019.  2-Hypothyroidism -Continue Synthroid.  3-chronic paroxysmal atrial fibrillation -Rate control and currently in sinus rhythm -Continue monitoring on telemetry -Continue beta-blocker -Receiving full anticoagulation therapy with heparin drip currently. -Patient reports using Eliquis prior to admission; this was considered to be failure for protection. -Will discuss with hematologist on 07/10/2019 for better oral choice at time of discharge.  Most likely Xarelto.  4-chronic diastolic heart failure -Appears to be stable and compensated -Continue to follow  daily weights, strict I's and  O's and low-sodium diet -Continue the use of beta-blocker and daily Demadex.  5-GERD (gastroesophageal reflux disease) -Continue PPI.  6-chronic pain syndrome -Continue the use of chronic fentanyl patch. -Patient expressed pain to be stable and well controlled.    7-history of interstitial pneumonitis-appears to be secondary to use of amiodarone for prior treatment of A. fib -Continue as needed bronchodilators -No requiring oxygen supplementation.  DVT prophylaxis: On heparin drip. Code Status: Full code Family Communication: No family at bedside. Disposition Plan: Remains in a stepdown unit while receiving heparin drip, close monitoring for any signs of bleeding and follow echocardiogram to complete evaluation for right heart strain.  If patient remains stable in the next 24 hours will transition to oral anticoagulation and pursuit discharge.   Consultants:   None   Procedures:   See below for x-ray reports  2-D echo  Antimicrobials:  Anti-infectives (From admission, onward)   None      Subjective: Reports no significant chest discomfort; good oxygen saturation on room air.  No nausea, no vomiting, no abdominal pain.  Patient is afebrile.  No signs of overt bleeding.  Objective: Vitals:   07/09/19 0600 07/09/19 0700 07/09/19 0744 07/09/19 0800  BP: 111/60 (!) 100/58  (!) 101/57  Pulse: 84 75  74  Resp: 14 16  14   Temp:   97.7 F (36.5 C)   TempSrc:   Oral   SpO2: 99% 97%  97%  Weight:      Height:        Intake/Output Summary (Last 24 hours) at 07/09/2019 0854 Last data filed at 07/09/2019 0547 Gross per 24 hour  Intake 579.39 ml  Output --  Net 579.39 ml   Filed Weights   07/08/19 1938 07/09/19 0230  Weight: 55.8 kg 57.4 kg    Examination: General exam: Alert, awake, oriented x 3; in no acute distress.  Reports no nausea, no vomiting and just very little chest discomfort on the left side. Respiratory system: Clear to auscultation. Respiratory effort  normal. Cardiovascular system:RRR. No rubs or gallops. Gastrointestinal system: Abdomen is nondistended, soft and nontender. No organomegaly or masses felt. Normal bowel sounds heard. Central nervous system: Alert and oriented. No focal neurological deficits. Extremities: No cyanosis or clubbing; trace edema appreciated (left more than right). Skin: No rashes, no petechiae. Psychiatry: Judgement and insight appear normal. Mood & affect appropriate.     Data Reviewed: I have personally reviewed following labs and imaging studies  CBC: Recent Labs  Lab 07/08/19 2018  WBC 7.0  HGB 12.0  HCT 38.7  MCV 93.3  PLT 628   Basic Metabolic Panel: Recent Labs  Lab 07/08/19 2018 07/08/19 2158  NA 136  --   K 3.9  --   CL 100  --   CO2 26  --   GLUCOSE 104*  --   BUN 15  --   CREATININE 0.73  --   CALCIUM 9.2  --   MG  --  2.0  PHOS  --  4.4   GFR: Estimated Creatinine Clearance: 47.4 mL/min (by C-G formula based on SCr of 0.73 mg/dL).   Liver Function Tests: Recent Labs  Lab 07/08/19 2018  AST 18  ALT 11  ALKPHOS 106  BILITOT 0.6  PROT 6.2*  ALBUMIN 3.8   Recent Labs  Lab 07/08/19 2018  LIPASE 31   Coagulation Profile: Recent Labs  Lab 07/09/19 0306  INR 1.2   CBG: Recent Labs  Lab 07/08/19 2042  GLUCAP 89   Urine analysis:    Component Value Date/Time   COLORURINE YELLOW 03/13/2013 2208   APPEARANCEUR CLEAR 03/13/2013 2208   LABSPEC 1.016 03/13/2013 2208   PHURINE 7.0 03/13/2013 2208   GLUCOSEU NEGATIVE 03/13/2013 2208   HGBUR NEGATIVE 03/13/2013 2208   BILIRUBINUR NEGATIVE 03/13/2013 2208   KETONESUR NEGATIVE 03/13/2013 2208   PROTEINUR NEGATIVE 03/13/2013 2208   UROBILINOGEN 0.2 03/13/2013 2208   NITRITE NEGATIVE 03/13/2013 2208   LEUKOCYTESUR NEGATIVE 03/13/2013 2208    Recent Results (from the past 240 hour(s))  Respiratory Panel by PCR     Status: None   Collection Time: 07/08/19 10:36 PM   Specimen: Nasopharyngeal Swab; Respiratory    Result Value Ref Range Status   Adenovirus NOT DETECTED NOT DETECTED Final   Coronavirus 229E NOT DETECTED NOT DETECTED Final    Comment: (NOTE) The Coronavirus on the Respiratory Panel, DOES NOT test for the novel  Coronavirus (2019 nCoV)    Coronavirus HKU1 NOT DETECTED NOT DETECTED Final   Coronavirus NL63 NOT DETECTED NOT DETECTED Final   Coronavirus OC43 NOT DETECTED NOT DETECTED Final   Metapneumovirus NOT DETECTED NOT DETECTED Final   Rhinovirus / Enterovirus NOT DETECTED NOT DETECTED Final   Influenza A NOT DETECTED NOT DETECTED Final   Influenza B NOT DETECTED NOT DETECTED Final   Parainfluenza Virus 1 NOT DETECTED NOT DETECTED Final   Parainfluenza Virus 2 NOT DETECTED NOT DETECTED Final   Parainfluenza Virus 3 NOT DETECTED NOT DETECTED Final   Parainfluenza Virus 4 NOT DETECTED NOT DETECTED Final   Respiratory Syncytial Virus NOT DETECTED NOT DETECTED Final   Bordetella pertussis NOT DETECTED NOT DETECTED Final   Chlamydophila pneumoniae NOT DETECTED NOT DETECTED Final   Mycoplasma pneumoniae NOT DETECTED NOT DETECTED Final    Comment: Performed at [facility], [place] [address] [place], [place] [address]  Respiratory Panel by RT PCR (Flu A&B, Covid) - Nasopharyngeal Swab     Status: None   Collection Time: 07/09/19 12:58 AM   Specimen: Nasopharyngeal Swab  Result Value Ref Range Status   SARS Coronavirus 2 by RT PCR NEGATIVE NEGATIVE Final    Comment: (NOTE) SARS-CoV-2 target nucleic acids are NOT DETECTED. The SARS-CoV-2 RNA is generally detectable in upper respiratoy specimens during the acute phase of infection. The lowest concentration of SARS-CoV-2 viral copies this assay can detect is 131 copies/mL. A negative result does not preclude SARS-Cov-2 infection and should not be used as the sole basis for treatment or other patient management decisions. A negative result may occur with  improper specimen collection/handling, submission of specimen other than  nasopharyngeal swab, presence of viral mutation(s) within the areas targeted by this assay, and inadequate number of viral copies (<131 copies/mL). A negative result must be combined with clinical observations, patient history, and epidemiological information. The expected result is Negative. Fact Sheet for Patients:  PinkCheek.be Fact Sheet for Healthcare Providers:  GravelBags.it This test is not yet ap proved or cleared by the [place] FDA and  has been authorized for detection and/or diagnosis of SARS-CoV-2 by FDA under an Emergency Use Authorization (EUA). This EUA will remain  in effect (meaning this test can be used) for the duration of the COVID-19 declaration under Section 564(b)(1) of the Act, 21 U.S.C. section 360bbb-3(b)(1), unless the authorization is terminated or revoked sooner.    Influenza A by PCR NEGATIVE NEGATIVE Final   Influenza B by PCR NEGATIVE NEGATIVE Final  Comment: (NOTE) The Xpert Xpress SARS-CoV-2/FLU/RSV assay is intended as an aid in  the diagnosis of influenza from Nasopharyngeal swab specimens and  should not be used as a sole basis for treatment. Nasal washings and  aspirates are unacceptable for Xpert Xpress SARS-CoV-2/FLU/RSV  testing. Fact Sheet for Patients: PinkCheek.be Fact Sheet for Healthcare Providers: GravelBags.it This test is not yet approved or cleared by the [place] FDA and  has been authorized for detection and/or diagnosis of SARS-CoV-2 by  FDA under an Emergency Use Authorization (EUA). This EUA will remain  in effect (meaning this test can be used) for the duration of the  Covid-19 declaration under Section 564(b)(1) of the Act, 21  U.S.C. section 360bbb-3(b)(1), unless the authorization is  terminated or revoked. Performed at [facility], [address] [place], [place] [address]   MRSA PCR Screening      Status: None   Collection Time: 07/09/19  2:32 AM   Specimen: Nasal Mucosa; Nasopharyngeal  Result Value Ref Range Status   MRSA by PCR NEGATIVE NEGATIVE Final    Comment:        The GeneXpert MRSA Assay (FDA approved for NASAL specimens only), is one component of a comprehensive MRSA colonization surveillance program. It is not intended to diagnose MRSA infection nor to guide or monitor treatment for MRSA infections. Performed at [facility], [address] [place], [place] [address]      Radiology Studies: CT Angio Chest PE W and/or Wo Contrast  Result Date: 07/08/2019 CLINICAL DATA:  Chest pain began 5 hours prior EXAM: CT ANGIOGRAPHY CHEST WITH CONTRAST TECHNIQUE: Multidetector CT imaging of the chest was performed using the standard protocol during bolus administration of intravenous contrast. Multiplanar CT image reconstructions and MIPs were obtained to evaluate the vascular anatomy. CONTRAST:  196m OMNIPAQUE IOHEXOL 350 MG/ML SOLN COMPARISON:  CT 05/10/2014 FINDINGS: Cardiovascular: Satisfactory opacification of the pulmonary arteries. While there is extensive streak in the right upper lobe secondary to the retained contrast bolus in the superior vena cava, hypoattenuating filling defect extending from right main pulmonary artery into the the right upper lobar and posterior segmental pulmonary arteries of the right upper lobe appear to be a true finding rather than artifactual (5/130-114). Small cysts subsegmental pulmonary embolus seen in the apicoposterior segment left upper lobe (5/103). No other hypoattenuating pulmonary arterial filling defects are identified.borderline dilatation of the left and right main pulmonary arteries. Chronic appearing dilatation of the right ventricle similar to prior with reflux of contrast into the IVC and hepatic veins. No elevation of the RV/LV ratio is seen. Mild cardiomegaly with compression of the right heart by a pectus deformity of the  chest (Haller index of 3.4). No pericardial effusion. Coronary artery calcifications are noted. Pacer leads present at the right atrium and cardiac apex. Pacer pack in the left chest wall soft tissues. Atherosclerotic plaque within the normal caliber aorta. Normal 3 vessel branching of the aortic arch. Suboptimal opacification of the aorta and proximal great vessels limit luminal evaluation. Mediastinum/Nodes: Numerous low-attenuation, subcentimeter mediastinal hilar nodes are present, favor reactive. No pathologically enlarged mediastinal, hilar or axillary adenopathy. No acute abnormality of the trachea. Small hiatal hernia. Absence of the right thyroid lobe, correlate for prior surgery or ablation. No abnormality of the left lobe thyroid. Lungs/Pleura: Diffuse fine reticular opacities in the lung periphery likely reflecting some underlying interstitial fibrosis there is diffuse interlobular septal thickening with superimposed areas of hazy ground-glass opacity favoring developing pulmonary edema. Consolidative opacity in the  right upper lung could reflect an area of pulmonary infarct or infectious/inflammatory consolidation particularly given the presence of other consolidative opacities in the lingula and right lower lobe. Chronic right pleural effusion, similar in size to the comparison study. Upper Abdomen: Much of the abdomen is collimated from cross-sectional imaging. No acute abnormality seen in the upper abdomen aside from reflux of contrast. Musculoskeletal: The osseous structures appear diffusely demineralized which may limit detection of small or nondisplaced fractures. No chest wall mass or suspicious bone lesions identified. Benign-appearing macro calcifications in the breasts similar to priors. Circumferential body wall edema. Review of the MIP images confirms the above findings. IMPRESSION: 1. Pulmonary artery emboli involving the right main pulmonary artery extending into the lobar and segmental  arteries of the right upper lobe and apical segment. Additional small subsegmental pulmonary embolus of the apicoposterior segment left upper lobe. 2. No elevation of the RV/LV ratio. However, there is right atrial enlargement, central pulmonary artery enlargement, and reflux of contrast into the hepatic veins and IVC which could be seen either with some degree of right heart strain or chronic elevated right heart pressures. 3. Consolidative opacity in the right upper lung may reflect an area of pulmonary infarct or infectious/inflammatory consolidation particularly given the presence of other consolidative opacities in the lingula and right lower lobe. Consider clinical exclusion underlying atypical infectious etiologies including COVID-19. 4. Features of pulmonary edema on a background of chronic interstitial fibrosis as well. Chronic right effusion. 5. Mild cardiomegaly with compression of the right heart by a pectus deformity of the chest (Haller index of 3.4). 6. Coronary artery calcifications. 7. Aortic Atherosclerosis (ICD10-I70.0). These results were called by telephone at the time of interpretation on 07/08/2019 at 10:37 pm to provider JULIE IDOL , who verbally acknowledged these results. Electronically Signed   By: Lovena Le M.D.   On: 07/08/2019 22:37   DG Chest Portable 1 View  Result Date: 07/08/2019 CLINICAL DATA:  Chest pain. History of CHF. EXAM: PORTABLE CHEST 1 VIEW COMPARISON:  Chest radiograph October 25, 2014 FINDINGS: Stable cardiomediastinal contours with borderline enlarged heart size. Left chest pacer leads terminate in the right atrium and right ventricle. There are diffuse bilateral coarse interstitial opacities no focal consolidation. Trace right pleural fluid. No pneumothorax. No acute finding in the visualized skeleton. IMPRESSION: Diffuse bilateral coarse interstitial opacities which may reflect interstitial edema or atypical infection in the acute setting, versus progression of  chronic lung disease. Trace right pleural effusion. Electronically Signed   By: Audie Pinto M.D.   On: 07/08/2019 20:41     Scheduled Meds: . ALPRAZolam  0.25 mg Oral QHS  . calcium carbonate  1 tablet Oral Q breakfast  . Chlorhexidine Gluconate Cloth  6 each Topical Daily  . cholecalciferol  1,000 Units Oral Daily  . fentaNYL  1 patch Transdermal Q3 days  . levothyroxine  100 mcg Oral Q0600  . metoprolol succinate  37.5 mg Oral Daily  . pantoprazole  40 mg Oral Daily  . potassium chloride SA  20 mEq Oral Daily  . torsemide  20 mg Oral Daily   Continuous Infusions: . heparin 900 Units/hr (07/09/19 0547)     LOS: 1 day    Time spent: 35 minutes. Greater than 50% of this time was spent in direct contact with the patient, coordinating care and discussing relevant ongoing clinical issues, including discussion about pulmonary embolism findings, the need for right heart site evaluation with echocardiogram and continuation of heparin drip for the  next 24 hours.  Hemodynamically stable currently; no signs of overt bleeding appreciated.     Barton Dubois, MD [facility] Pager [phone]   07/09/2019, 8:54 AM

## 2019-07-09 NOTE — Progress Notes (Signed)
Patient has fentanyl patch to LUQ. Placed from home. Tegaderm placed over the patch. Verifeid with Einar Pheasant upon admission assessment. Patch to be changed 3/22 per patient

## 2019-07-09 NOTE — Progress Notes (Signed)
*  PRELIMINARY RESULTS* Echocardiogram 2D Echocardiogram has been performed.  Cindy [name] 07/09/2019, 12:33 PM

## 2019-07-09 NOTE — Progress Notes (Signed)
Fentanyl transdermal patch to Right flank visualized and in place. Ensured placement and verified patch is in place during assessment. Will continue to monitor. Placed 07/09/19 by Dyane Dustman, [place].

## 2019-07-10 DIAGNOSIS — E8581 Light chain (AL) amyloidosis: Secondary | ICD-10-CM

## 2019-07-10 LAB — CBC
HCT: 33.1 % — ABNORMAL LOW (ref 36.0–46.0)
Hemoglobin: 10.3 g/dL — ABNORMAL LOW (ref 12.0–15.0)
MCH: 28.8 pg (ref 26.0–34.0)
MCHC: 31.1 g/dL (ref 30.0–36.0)
MCV: 92.5 fL (ref 80.0–100.0)
Platelets: 204 10*3/uL (ref 150–400)
RBC: 3.58 MIL/uL — ABNORMAL LOW (ref 3.87–5.11)
RDW: 15.9 % — ABNORMAL HIGH (ref 11.5–15.5)
WBC: 5.9 10*3/uL (ref 4.0–10.5)
nRBC: 0 % (ref 0.0–0.2)

## 2019-07-10 LAB — HEPARIN LEVEL (UNFRACTIONATED): Heparin Unfractionated: 0.22 IU/mL — ABNORMAL LOW (ref 0.30–0.70)

## 2019-07-10 MED ORDER — APIXABAN 5 MG PO TABS
10.0000 mg | ORAL_TABLET | Freq: Two times a day (BID) | ORAL | Status: DC
  Administered 2019-07-10: 10 mg via ORAL
  Filled 2019-07-10: qty 2

## 2019-07-10 MED ORDER — APIXABAN 5 MG PO TABS: ORAL_TABLET | ORAL | 2 refills | Status: DC

## 2019-07-10 MED ORDER — APIXABAN 5 MG PO TABS: 5.0000 mg | ORAL_TABLET | Freq: Two times a day (BID) | ORAL | Status: DC

## 2019-07-10 MED ORDER — HEPARIN BOLUS VIA INFUSION
1000.0000 [IU] | Freq: Once | INTRAVENOUS | Status: AC
  Administered 2019-07-10: 1000 [IU] via INTRAVENOUS
  Filled 2019-07-10: qty 1000

## 2019-07-10 MED ORDER — METOPROLOL SUCCINATE ER 25 MG PO TB24: 12.5000 mg | ORAL_TABLET | Freq: Two times a day (BID) | ORAL | Status: AC

## 2019-07-10 NOTE — Progress Notes (Signed)
Patient alert and oriented x4. No complaints of pain, shortness of breath, chest pain, dizziness, nausea or vomiting. Patient up out of bed, ambulatory with supervision to commode and in room. Gait steady with no complaints of pain, shortness of breath or discomfort. Patient tolerated PO meds and diet well, appetite good. Fentanyl patch remains in place on the right side flank. IV removed and pressure applied until bleeding at insertion site stopped. Dressing is currently clean, dry and intact with no bleeding noted. Discharge instructions, appointment follow up information and medication education gone over with patient and patient's husband Cindy [name]). ALL questions answered and both expressed full understanding of instructions, information and education with teach back. Eliquis increased to 44m for one week then to titrate down to 567mwas explained to patient and husband. Both expressed full understanding with teach back. Patient educated on watching for bleeding precautions, bruising, not to take NSAIDS (examples of what they are were given). Patient discharged for home with all belongings via car (Cindy [name]/husband) is driving patient home.

## 2019-07-10 NOTE — Progress Notes (Signed)
ANTICOAGULATION CONSULT NOTE - Follow Up Consult  Pharmacy Consult for heparin Indication: atrial fibrillation and pulmonary embolus  Labs: Recent Labs    07/08/19 2018 07/08/19 2156 07/09/19 0306 07/09/19 0814 07/09/19 1701 07/10/19 0521  HGB 12.0  --   --   --   --  10.3*  HCT 38.7  --   --   --   --  33.1*  PLT 239  --   --   --   --  204  APTT  --   --  59* 67*  --   --   LABPROT  --   --  15.0  --   --   --   INR  --   --  1.2  --   --   --   HEPARINUNFRC  --   --   --  0.43 0.32 0.22*  CREATININE 0.73  --   --   --   --   --   TROPONINIHS 6 6  --   --   --   --     Assessment: 86yo female now subtherapeutic on heparin after two levels at goal though had been trending down with last level at very low end of goal, likely d/t Eliquis clearing; no gtt issues or signs of bleeding per RN.  Goal of Therapy:  Heparin level 0.3-0.7 units/ml   Plan:  Will give small bolus of heparin 1000 units and increase heparin gtt by 2-3 units/kg/hr to 1150 units/hr and check level in 8 hours.    Wynona Neat, PharmD, BCPS  07/10/2019,6:47 AM

## 2019-07-10 NOTE — Progress Notes (Signed)
ANTICOAGULATION CONSULT NOTE - Follow Up Consult  Pharmacy Consult for apixaban Indication: atrial fibrillation and pulmonary embolus  Labs: Recent Labs    07/08/19 2018 07/08/19 2156 07/09/19 0306 07/09/19 0814 07/09/19 1701 07/10/19 0521  HGB 12.0  --   --   --   --  10.3*  HCT 38.7  --   --   --   --  33.1*  PLT 239  --   --   --   --  204  APTT  --   --  59* 67*  --   --   LABPROT  --   --  15.0  --   --   --   INR  --   --  1.2  --   --   --   HEPARINUNFRC  --   --   --  0.43 0.32 0.22*  CREATININE 0.73  --   --   --   --   --   TROPONINIHS 6 6  --   --   --   --     Assessment: Pharmacy consulted to dose apixaban in patient with pulmonary embolism and atrial fibrillation.  Transition from heparin IV to apixaban.    Plan:  Stop heparin infusion. Start apixaban 10 mg BID x 7 days followed by apixaban 5 mg BID thereafter. Monitor labs and s/s of bleeding.

## 2019-07-10 NOTE — Progress Notes (Addendum)
Patient placed on 2L of oxygen via nasal cannula due to desaturation to 88% while sleeping. Desaturation was not sustained, but was a period of apnea. 2L of oxygen maintained her saturation of 96% to 100%. No s/s of distress.

## 2019-07-10 NOTE — Discharge Summary (Signed)
Physician Discharge Summary  Cindy [name] YCX:[id number] DOB: Aug 09, 1932 DOA: 07/08/2019  PCP: Celene Squibb, MD  Admit date: 07/08/2019 Discharge date: 07/10/2019  Time spent: 35 minutes  Recommendations for Outpatient Follow-up:  1. Repeat CBC to follow hemoglobin trend 2. 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  Patient discharged home with instruction to follow-up with PCP and with hematology/oncology service as an outpatient.  CODE STATUS: Full code.  Diet recommendation: Heart healthy diet.  Filed Weights   07/08/19 1938 07/09/19 0230 07/10/19 0400  Weight: 55.8 kg 57.4 kg 58.3 kg    History of present illness:  As per H&P written by Dr. Olevia Bowens on 07/08/2019 86 y.o.femalewith medical history significant ofcancer, chronic diastolic CHF, amyloidosis, diarrhea, diverticulosis, esophagitis, GERD, stricturing a stenosis of esophagus, hypothyroidism, flatulence, left-sided ulcerative colitis, maxillary sinus mass, fibrosis of the lungs, osteoporosis, hyperlipidemia who is coming to the emergency department due to left-sided chest pain that started 2 hours prior to arrival to the emergency department.The pain has sudden onset. It was nonradiated, sharp associated with significant dyspnea. She denies palpitations, dizziness, diaphoresis, PND or orthopnea. She has unilateral left lower extremity edema, which was checked for DVT with ultrasound on June 27, 2019 with negative results. She thought that she was having GERD related symptoms  took multiple Tums tablet and while her Nexium 40 mg capsules without significant relief. She denies fever, chills, rhinorrhea, sore throat, wheezing or hemoptysis. No abdominal pain, diarrhea, constipation, melena or hematochezia. Denies dysuria, frequency or hematuria. No polyuria, polydipsia, polyphagia or blurred vision.  ED Course:Initial vital signs were temperature 99 F, pulse 78, respirations 17, blood pressure 132/64 mmHg and O2 sat 98% on room air. The patient was given 324 mg of chewable aspirin and was started on a heparin infusion. PCCM was contacted due to questionable right ventricle strain, but the patient does not meet criteria for transfer at this time.  CBC showed a white count of 7.0, hemoglobin 12.0 g/dL and platelets 239. D-dimer was 0.88. CMP showed a glucose of 104 mg/dL and total protein 6.2 g/dL, all other values are within normal range. Lipase and troponin x2 have been normal.  Imaging:Portable chest showed diffuse bilateral coarse interstitial opacities which could be due to acute interstitial edema or atypical infection versus progression of ILD. CTA chest showed a pulmonary artery emboli involving the right main pulmonary artery extending into the lobar and segmental arteries of the right upper lobe and apical segment. There is also additional small subsegmental pulmonary embolus of the apical posterior segment left upper lobe. No elevation of the RV/LV rate you however there is right atrial enlargement, central pulmonary artery enlargement and reflux of contrast into the hepatic veins and IVC. There is consolidative opacity in the right upper lung which may reflect an area of pulmonary infarct or infection/inflammatory consolidation. Features of pulmonary edema on a background of chronic interstitial fibrosis. There is mild cardiomegaly with compression of the right heart by a pectus deformity of the chest. Please see images and full cardiology report for  further detail.  Hospital Course:  1-Pulmonary  embolism ([place]) -Currently denies significant chest discomfort and demonstrate good oxygen saturation on room air -Patient completed 24 hours of IV heparin drip and subsequently transition to adjusted dose of Eliquis as per hematology/oncology service recommendations. -2D echo demonstrating a stable ejection fraction, no wall motion and normalities and no right heart strain. -Hematology/oncology service at [facility] (Dr. Amalia Hailey) contacted for recommendations about type of anticoagulation to be used at discharge.  -Recommendations given for full dose Eliquis after usual loading dose of 7 days.  -No signs of overt bleeding seen.   -Patient will follow up with hematology/oncology service after discharge.  2-Hypothyroidism -Continue Synthroid. -Follow thyroid panel as an outpatient and nodules Synthroid dosage as needed.  3-chronic paroxysmal atrial fibrillation -Rate controlled and currently in sinus rhythm -Continue beta-blocker current dose -Continue outpatient follow-up with cardiology service. -Will be discharge on adjusted dose of Eliquis as recommended by hematology/oncology service.  4-chronic diastolic heart failure -Appears to be stable and compensated -Continue to follow daily weights and low-sodium diet -Continue the use of beta-blocker and daily Demadex.  5-GERD (gastroesophageal reflux disease) -Continue PPI.  6-chronic pain syndrome -Continue the use of chronic fentanyl patch and as needed Tylenol.. -Patient expressed pain to be stable and well controlled.    7-history of interstitial pneumonitis-appears to be secondary to use of amiodarone in the past for prior treatment of A. fib -Continue as needed bronchodilators -No requiring oxygen supplementation. -Patient reports no shortness of breath.   Procedures:  See below for x-ray reports  2D echo: Demonstrating preserved ejection fraction, no wall motion  abnormalities and no right heart strain.  Consultations:  None  Discharge Exam: Vitals:   07/10/19 1000 07/10/19 1200  BP: 107/61 101/78  Pulse: 87 79  Resp: 16 19  Temp:    SpO2: 95% 97%   General exam: Alert, awake, oriented x 3; in no acute distress.  Reports no nausea, no vomiting and just very little chest discomfort on the left side. Respiratory system: Clear to auscultation. Respiratory effort normal. Cardiovascular system:RRR. No rubs or gallops. Gastrointestinal system: Abdomen is nondistended, soft and nontender. No organomegaly or masses felt. Normal bowel sounds heard. Central nervous system: Alert and oriented. No focal neurological deficits. Extremities: No cyanosis or clubbing; trace edema appreciated (left more than right). Skin: No rashes, no petechiae. Psychiatry: Judgement and insight appear normal. Mood & affect appropriate.    Discharge Instructions   Discharge Instructions    (HEART FAILURE PATIENTS) Call MD:  Anytime you have any of the following symptoms: 1) 3 pound weight gain in 24 hours or 5 pounds in 1 week 2) shortness of breath, with or without a dry hacking cough 3) swelling in the hands, feet or stomach 4) if you have to sleep on extra pillows at night in order to breathe.   Complete by: As directed    Diet - low sodium heart healthy   Complete by: As directed    Discharge instructions   Complete by: As directed    Maintain adequate hydration Continue to follow heart healthy/low-sodium diet Follow daily weights Be very vigilant for any signs of overt bleeding Arrange follow-up with PCP in 10 days Arrange follow-up visits with hemat[facility]/oncology service at [facility].     Allergies as of 07/10/2019      Reactions   Nebivolol Hcl    Other reaction(s): Other (See Comments) Chest tightness,sob,cough,severe tiredness,confusion   Codeine Nausea Only   Sulfa Antibiotics Nausea Only   Sulfonamide Derivatives Nausea Only  Medication List     TAKE these medications   ALPRAZolam 0.5 MG tablet Commonly known as: Xanax Take 1 tablet (0.5 mg total) by mouth at bedtime as needed for anxiety.   apixaban 5 MG Tabs tablet Commonly known as: ELIQUIS Take 2 tablets by mouth twice a day for 7 days; and after that 1 tablet twice a day on daily basis. What changed:   medication strength  how to take this  when to take this  additional instructions   calcium carbonate 600 MG Tabs tablet Commonly known as: OS-CAL Take 600 mg by mouth daily with breakfast.   cholecalciferol 1000 units tablet Commonly known as: VITAMIN D Take 1,000 Units by mouth daily.   CYANOCOBALAMIN PO Take 1 tablet by mouth daily.   esomeprazole 40 MG capsule Commonly known as: NEXIUM Take 40 mg by mouth daily as needed.   fentaNYL 25 MCG/HR Commonly known as: [place] 1 patch onto the skin every 3 (three) days.   levothyroxine 100 MCG tablet Commonly known as: SYNTHROID Take 100 mcg by mouth every morning.   metoprolol succinate 25 MG 24 hr tablet Commonly known as: TOPROL-XL Take 0.5-1 tablets (12.5-25 mg total) by mouth in the morning and at bedtime. Patient takes 1 tablet (19m) in the morning and 1/2 of a tablet (12.569m in the evening   Potassium Chloride ER 20 MEQ Tbcr Take 1 tablet by mouth daily.   sodium chloride 0.65 % nasal spray Commonly known as: OCEAN Place 2 sprays into the nose daily as needed.   torsemide 20 MG tablet Commonly known as: DEMADEX Take 20 mg by mouth daily.   Tylenol 325 MG tablet Generic drug: acetaminophen Take 325 mg by mouth every 4 (four) hours as needed for mild pain, moderate pain or headache.   WOMENS HAIR, SKIN & NAILS PO Take 1 tablet by mouth daily.      Allergies  Allergen Reactions  . Nebivolol Hcl     Other reaction(s): Other (See Comments) Chest tightness,sob,cough,severe tiredness,confusion  . Codeine Nausea Only  . Sulfa Antibiotics Nausea Only  . Sulfonamide Derivatives  Nausea Only   Follow-up Information    HaCelene SquibbMD. Schedule an appointment as soon as possible for a visit in 10 day(s).   Specialty: Internal Medicine Contact information: [phone]         The results of significant diagnostics from this hospitalization (including imaging, microbiology, ancillary and laboratory) are listed below for reference.    Significant Diagnostic Studies: CT Angio Chest PE W and/or Wo Contrast  Result Date: 07/08/2019 CLINICAL DATA:  Chest pain began 5 hours prior EXAM: CT ANGIOGRAPHY CHEST WITH CONTRAST TECHNIQUE: Multidetector CT imaging of the chest was performed using the standard protocol during bolus administration of intravenous contrast. Multiplanar CT image reconstructions and MIPs were obtained to evaluate the vascular anatomy. CONTRAST:  10045mMNIPAQUE IOHEXOL 350 MG/ML SOLN COMPARISON:  CT 05/10/2014 FINDINGS: Cardiovascular: Satisfactory opacification of the pulmonary arteries. While there is extensive streak in the right upper lobe secondary to the retained contrast bolus in the superior vena cava, hypoattenuating filling defect extending from right main pulmonary artery into the the right upper lobar and posterior segmental pulmonary arteries of the right upper lobe appear to be a true finding rather than artifactual (5/130-114). Small cysts subsegmental pulmonary embolus seen in the apicoposterior segment left upper lobe (5/103). No other hypoattenuating pulmonary arterial filling defects are identified.borderline dilatation of  the left and right main pulmonary arteries. Chronic appearing dilatation of the right ventricle similar to prior with reflux of contrast into the IVC and hepatic veins. No elevation of the RV/LV ratio is seen. Mild cardiomegaly with compression of the right heart by a pectus deformity of the chest (Haller index of 3.4). No pericardial effusion. Coronary artery calcifications are  noted. Pacer leads present at the right atrium and cardiac apex. Pacer pack in the left chest wall soft tissues. Atherosclerotic plaque within the normal caliber aorta. Normal 3 vessel branching of the aortic arch. Suboptimal opacification of the aorta and proximal great vessels limit luminal evaluation. Mediastinum/Nodes: Numerous low-attenuation, subcentimeter mediastinal hilar nodes are present, favor reactive. No pathologically enlarged mediastinal, hilar or axillary adenopathy. No acute abnormality of the trachea. Small hiatal hernia. Absence of the right thyroid lobe, correlate for prior surgery or ablation. No abnormality of the left lobe thyroid. Lungs/Pleura: Diffuse fine reticular opacities in the lung periphery likely reflecting some underlying interstitial fibrosis there is diffuse interlobular septal thickening with superimposed areas of hazy ground-glass opacity favoring developing pulmonary edema. Consolidative opacity in the right upper lung could reflect an area of pulmonary infarct or infectious/inflammatory consolidation particularly given the presence of other consolidative opacities in the lingula and right lower lobe. Chronic right pleural effusion, similar in size to the comparison study. Upper Abdomen: Much of the abdomen is collimated from cross-sectional imaging. No acute abnormality seen in the upper abdomen aside from reflux of contrast. Musculoskeletal: The osseous structures appear diffusely demineralized which may limit detection of small or nondisplaced fractures. No chest wall mass or suspicious bone lesions identified. Benign-appearing macro calcifications in the breasts similar to priors. Circumferential body wall edema. Review of the MIP images confirms the above findings. IMPRESSION: 1. Pulmonary artery emboli involving the right main pulmonary artery extending into the lobar and segmental arteries of the right upper lobe and apical segment. Additional small subsegmental  pulmonary embolus of the apicoposterior segment left upper lobe. 2. No elevation of the RV/LV ratio. However, there is right atrial enlargement, central pulmonary artery enlargement, and reflux of contrast into the hepatic veins and IVC which could be seen either with some degree of right heart strain or chronic elevated right heart pressures. 3. Consolidative opacity in the right upper lung may reflect an area of pulmonary infarct or infectious/inflammatory consolidation particularly given the presence of other consolidative opacities in the lingula and right lower lobe. Consider clinical exclusion underlying atypical infectious etiologies including COVID-19. 4. Features of pulmonary edema on a background of chronic interstitial fibrosis as well. Chronic right effusion. 5. Mild cardiomegaly with compression of the right heart by a pectus deformity of the chest (Haller index of 3.4). 6. Coronary artery calcifications. 7. Aortic Atherosclerosis (ICD10-I70.0). These results were called by telephone at the time of interpretation on 07/08/2019 at 10:37 pm to provider JULIE IDOL , who verbally acknowledged these results. Electronically Signed   By: Lovena Le M.D.   On: 07/08/2019 22:37   DG Chest Portable 1 View  Result Date: 07/08/2019 CLINICAL DATA:  Chest pain. History of CHF. EXAM: PORTABLE CHEST 1 VIEW COMPARISON:  Chest radiograph October 25, 2014 FINDINGS: Stable cardiomediastinal contours with borderline enlarged heart size. Left chest pacer leads terminate in the right atrium and right ventricle. There are diffuse bilateral coarse interstitial opacities no focal consolidation. Trace right pleural fluid. No pneumothorax. No acute finding in the visualized skeleton. IMPRESSION: Diffuse bilateral coarse interstitial opacities which may reflect interstitial edema or  atypical infection in the acute setting, versus progression of chronic lung disease. Trace right pleural effusion. Electronically Signed   By: Audie Pinto M.D.   On: 07/08/2019 20:41   ECHOCARDIOGRAM COMPLETE  Result Date: 07/09/2019    ECHOCARDIOGRAM REPORT   Patient Name:   Cindy [name] Date of Exam: 07/09/2019 Medical Rec #:  [id number]          Height:       68.0 in Accession #:    [id number]         Weight:       126.5 lb Date of Birth:  Mar 17, 1933          BSA:          1.682 m Patient Age:    31 years           BP:           101/64 mmHg Patient Gender: F                  HR:           74 bpm. Exam Location:  [facility] Procedure: 2D Echo, Cardiac Doppler and Color Doppler Indications:    Pulmonary Embolus 415.19 / I26.99  History:        Patient has prior history of Echocardiogram examinations, most                 recent 03/14/2013. Arrythmias:Atrial Fibrillation; Risk                 Factors:Hypertension and Dyslipidemia. Chronic diastolic heart                 failure,GERD,Cardiac amyloidosis,Cancer ([place]) (From Hx).  Sonographer:    Alvino Chapel RCS Referring Phys: [id number] [place]  1. Left ventricular ejection fraction, by estimation, is 60 to 65%. The left ventricle has normal function. The left ventricle has no regional wall motion abnormalities. There is mild left ventricular hypertrophy. Left ventricular diastolic parameters are indeterminate.  2. Right ventricular systolic function is normal. The right ventricular size is normal. There is moderately elevated pulmonary artery systolic pressure. The estimated right ventricular systolic pressure is 73.7 mmHg.  3. Left atrial size was mildly dilated.  4. The mitral valve is rheumatic. Moderate mitral valve regurgitation. No evidence of mitral stenosis.  5. Tricuspid valve regurgitation is moderate.  6. The aortic valve is normal in structure. Aortic valve regurgitation is not visualized. Mild aortic valve sclerosis is present, with no evidence of aortic valve stenosis.  7. The inferior vena cava is normal in size with greater than 50% respiratory variability,  suggesting right atrial pressure of 3 mmHg. FINDINGS  Left Ventricle: Left ventricular ejection fraction, by estimation, is 60 to 65%. The left ventricle has normal function. The left ventricle has no regional wall motion abnormalities. The left ventricular internal cavity size was normal in size. There is  mild left ventricular hypertrophy. Left ventricular diastolic parameters are indeterminate. Right Ventricle: The right ventricular size is normal. No increase in right ventricular wall thickness. Right ventricular systolic function is normal. There is moderately elevated pulmonary artery systolic pressure. The tricuspid regurgitant velocity is 3.43 m/s, and with an assumed right atrial pressure of 3 mmHg, the estimated right ventricular systolic pressure is 10.6 mmHg. Left Atrium: Left atrial size was mildly dilated. Right Atrium: Right atrial size was normal in size. Pericardium: There is no evidence of pericardial effusion. Mitral Valve: The mitral valve  is rheumatic. There is mild thickening of the mitral valve leaflet(s). There is mild calcification of the mitral valve leaflet(s). Normal mobility of the mitral valve leaflets. Moderate mitral valve regurgitation. No evidence of mitral valve stenosis. Tricuspid Valve: The tricuspid valve is normal in structure. Tricuspid valve regurgitation is moderate . No evidence of tricuspid stenosis. Aortic Valve: The aortic valve is normal in structure.. There is mild thickening and mild calcification of the aortic valve. Aortic valve regurgitation is not visualized. Mild aortic valve sclerosis is present, with no evidence of aortic valve stenosis. There is mild thickening of the aortic valve. There is mild calcification of the aortic valve. Pulmonic Valve: The pulmonic valve was normal in structure. Pulmonic valve regurgitation is trivial. No evidence of pulmonic stenosis. Aorta: The aortic root is normal in size and structure. Venous: The inferior vena cava is normal in  size with greater than 50% respiratory variability, suggesting right atrial pressure of 3 mmHg. IAS/Shunts: No atrial level shunt detected by color flow Doppler. Additional Comments: A pacer wire is visualized.  LEFT VENTRICLE PLAX 2D LVIDd:         3.78 cm LVIDs:         2.60 cm LV PW:         1.42 cm LV IVS:        1.26 cm LVOT diam:     1.70 cm LV SV:         32 LV SV Index:   19 LVOT Area:     2.27 cm  RIGHT VENTRICLE RV Mid diam:    3.14 cm TAPSE (M-mode): 1.4 cm LEFT ATRIUM             Index LA diam:        3.55 cm 2.11 cm/m LA Vol (A2C):   87.7 ml 52.13 ml/m LA Vol (A4C):   62.9 ml 37.39 ml/m LA Biplane Vol: 79.6 ml 47.31 ml/m  AORTIC VALVE LVOT Vmax:   56.90 cm/s LVOT Vmean:  44.600 cm/s LVOT VTI:    0.141 m  AORTA Ao Root diam: 3.00 cm MITRAL VALVE                TRICUSPID VALVE MV Area (PHT): 3.77 cm     TR Peak grad:   47.1 mmHg MV Decel Time: 201 msec     TR Vmax:        343.00 cm/s MR Peak grad: 74.3 mmHg MR Mean grad: 54.0 mmHg     SHUNTS MR Vmax:      431.00 cm/s   Systemic VTI:  0.14 m MR Vmean:     347.0 cm/s    Systemic Diam: 1.70 cm MV E velocity: 166.00 cm/s Candee Furbish MD Electronically signed by Candee Furbish MD Signature Date/Time: 07/09/2019/12:49:43 PM    Final     Microbiology: Recent Results (from the past 240 hour(s))  Respiratory Panel by PCR     Status: None   Collection Time: 07/08/19 10:36 PM   Specimen: Nasopharyngeal Swab; Respiratory  Result Value Ref Range Status   Adenovirus NOT DETECTED NOT DETECTED Final   Coronavirus 229E NOT DETECTED NOT DETECTED Final    Comment: (NOTE) The Coronavirus on the Respiratory Panel, DOES NOT test for the novel  Coronavirus (2019 nCoV)    Coronavirus HKU1 NOT DETECTED NOT DETECTED Final   Coronavirus NL63 NOT DETECTED NOT DETECTED Final   Coronavirus OC43 NOT DETECTED NOT DETECTED Final   Metapneumovirus NOT DETECTED NOT DETECTED Final  Rhinovirus / Enterovirus NOT DETECTED NOT DETECTED Final   Influenza A NOT DETECTED NOT  DETECTED Final   Influenza B NOT DETECTED NOT DETECTED Final   Parainfluenza Virus 1 NOT DETECTED NOT DETECTED Final   Parainfluenza Virus 2 NOT DETECTED NOT DETECTED Final   Parainfluenza Virus 3 NOT DETECTED NOT DETECTED Final   Parainfluenza Virus 4 NOT DETECTED NOT DETECTED Final   Respiratory Syncytial Virus NOT DETECTED NOT DETECTED Final   Bordetella pertussis NOT DETECTED NOT DETECTED Final   Chlamydophila pneumoniae NOT DETECTED NOT DETECTED Final   Mycoplasma pneumoniae NOT DETECTED NOT DETECTED Final    Comment: Performed at [facility], [place] [address] [place], [place] [address]  Respiratory Panel by RT PCR (Flu A&B, Covid) - Nasopharyngeal Swab     Status: None   Collection Time: 07/09/19 12:58 AM   Specimen: Nasopharyngeal Swab  Result Value Ref Range Status   SARS Coronavirus 2 by RT PCR NEGATIVE NEGATIVE Final    Comment: (NOTE) SARS-CoV-2 target nucleic acids are NOT DETECTED. The SARS-CoV-2 RNA is generally detectable in upper respiratoy specimens during the acute phase of infection. The lowest concentration of SARS-CoV-2 viral copies this assay can detect is 131 copies/mL. A negative result does not preclude SARS-Cov-2 infection and should not be used as the sole basis for treatment or other patient management decisions. A negative result may occur with  improper specimen collection/handling, submission of specimen other than nasopharyngeal swab, presence of viral mutation(s) within the areas targeted by this assay, and inadequate number of viral copies (<131 copies/mL). A negative result must be combined with clinical observations, patient history, and epidemiological information. The expected result is Negative. Fact Sheet for Patients:  PinkCheek.be Fact Sheet for Healthcare Providers:  GravelBags.it This test is not yet ap proved or cleared by the [place] FDA and  has been authorized for  detection and/or diagnosis of SARS-CoV-2 by FDA under an Emergency Use Authorization (EUA). This EUA will remain  in effect (meaning this test can be used) for the duration of the COVID-19 declaration under Section 564(b)(1) of the Act, 21 U.S.C. section 360bbb-3(b)(1), unless the authorization is terminated or revoked sooner.    Influenza A by PCR NEGATIVE NEGATIVE Final   Influenza B by PCR NEGATIVE NEGATIVE Final    Comment: (NOTE) The Xpert Xpress SARS-CoV-2/FLU/RSV assay is intended as an aid in  the diagnosis of influenza from Nasopharyngeal swab specimens and  should not be used as a sole basis for treatment. Nasal washings and  aspirates are unacceptable for Xpert Xpress SARS-CoV-2/FLU/RSV  testing. Fact Sheet for Patients: PinkCheek.be Fact Sheet for Healthcare Providers: GravelBags.it This test is not yet approved or cleared by the [place] FDA and  has been authorized for detection and/or diagnosis of SARS-CoV-2 by  FDA under an Emergency Use Authorization (EUA). This EUA will remain  in effect (meaning this test can be used) for the duration of the  Covid-19 declaration under Section 564(b)(1) of the Act, 21  U.S.C. section 360bbb-3(b)(1), unless the authorization is  terminated or revoked. Performed at [facility], [address] [place], [place] [address]   MRSA PCR Screening     Status: None   Collection Time: 07/09/19  2:32 AM   Specimen: Nasal Mucosa; Nasopharyngeal  Result Value Ref Range Status   MRSA by PCR NEGATIVE NEGATIVE Final    Comment:        The GeneXpert MRSA Assay (FDA approved for NASAL specimens only), is one  component of a comprehensive MRSA colonization surveillance program. It is not intended to diagnose MRSA infection nor to guide or monitor treatment for MRSA infections. Performed at [facility], [address] [place], [place] [address]      Labs: Basic Metabolic  Panel: Recent Labs  Lab 07/08/19 2018 07/08/19 2158  NA 136  --   K 3.9  --   CL 100  --   CO2 26  --   GLUCOSE 104*  --   BUN 15  --   CREATININE 0.73  --   CALCIUM 9.2  --   MG  --  2.0  PHOS  --  4.4   Liver Function Tests: Recent Labs  Lab 07/08/19 2018  AST 18  ALT 11  ALKPHOS 106  BILITOT 0.6  PROT 6.2*  ALBUMIN 3.8   Recent Labs  Lab 07/08/19 2018  LIPASE 31   CBC: Recent Labs  Lab 07/08/19 2018 07/10/19 0521  WBC 7.0 5.9  HGB 12.0 10.3*  HCT 38.7 33.1*  MCV 93.3 92.5  PLT 239 204   BNP (last 3 results) Recent Labs    07/08/19 2156  BNP 142.0*   CBG: Recent Labs  Lab 07/08/19 2042  GLUCAP 89    Signed:  Barton Dubois MD.  [facility] 07/10/2019, 12:27 PM

## 2019-07-10 NOTE — Progress Notes (Signed)
Fentanyl patch removed from left side flank and new fentanyl patch placed on the right side flank per patient request.

## 2019-07-14 DIAGNOSIS — N390 Urinary tract infection, site not specified: Secondary | ICD-10-CM | POA: Diagnosis not present

## 2019-07-14 DIAGNOSIS — I482 Chronic atrial fibrillation, unspecified: Secondary | ICD-10-CM | POA: Diagnosis not present

## 2019-07-14 DIAGNOSIS — E039 Hypothyroidism, unspecified: Secondary | ICD-10-CM | POA: Diagnosis not present

## 2019-07-14 DIAGNOSIS — I509 Heart failure, unspecified: Secondary | ICD-10-CM | POA: Diagnosis not present

## 2019-07-14 DIAGNOSIS — Z0189 Encounter for other specified special examinations: Secondary | ICD-10-CM | POA: Diagnosis not present

## 2019-07-14 DIAGNOSIS — E859 Amyloidosis, unspecified: Secondary | ICD-10-CM | POA: Diagnosis not present

## 2019-07-14 DIAGNOSIS — I2699 Other pulmonary embolism without acute cor pulmonale: Secondary | ICD-10-CM | POA: Diagnosis not present

## 2019-07-14 DIAGNOSIS — E8581 Light chain (AL) amyloidosis: Secondary | ICD-10-CM | POA: Diagnosis not present

## 2019-07-14 DIAGNOSIS — R21 Rash and other nonspecific skin eruption: Secondary | ICD-10-CM | POA: Diagnosis not present

## 2019-07-14 DIAGNOSIS — G894 Chronic pain syndrome: Secondary | ICD-10-CM | POA: Diagnosis not present

## 2019-07-14 DIAGNOSIS — N399 Disorder of urinary system, unspecified: Secondary | ICD-10-CM | POA: Diagnosis not present

## 2019-07-17 DIAGNOSIS — H353132 Nonexudative age-related macular degeneration, bilateral, intermediate dry stage: Secondary | ICD-10-CM | POA: Diagnosis not present

## 2019-07-17 DIAGNOSIS — H353221 Exudative age-related macular degeneration, left eye, with active choroidal neovascularization: Secondary | ICD-10-CM | POA: Diagnosis not present

## 2019-07-17 DIAGNOSIS — H43812 Vitreous degeneration, left eye: Secondary | ICD-10-CM | POA: Diagnosis not present

## 2019-07-17 DIAGNOSIS — H35352 Cystoid macular degeneration, left eye: Secondary | ICD-10-CM | POA: Diagnosis not present

## 2019-07-26 DIAGNOSIS — E8581 Light chain (AL) amyloidosis: Secondary | ICD-10-CM | POA: Diagnosis not present

## 2019-07-26 DIAGNOSIS — I2699 Other pulmonary embolism without acute cor pulmonale: Secondary | ICD-10-CM | POA: Diagnosis not present

## 2019-08-03 DIAGNOSIS — E039 Hypothyroidism, unspecified: Secondary | ICD-10-CM | POA: Diagnosis not present

## 2019-08-03 DIAGNOSIS — I509 Heart failure, unspecified: Secondary | ICD-10-CM | POA: Diagnosis not present

## 2019-08-03 DIAGNOSIS — G894 Chronic pain syndrome: Secondary | ICD-10-CM | POA: Diagnosis not present

## 2019-08-07 ENCOUNTER — Encounter (INDEPENDENT_AMBULATORY_CARE_PROVIDER_SITE_OTHER): Payer: Self-pay | Admitting: Ophthalmology

## 2019-08-07 ENCOUNTER — Other Ambulatory Visit: Payer: Self-pay

## 2019-08-07 ENCOUNTER — Ambulatory Visit (INDEPENDENT_AMBULATORY_CARE_PROVIDER_SITE_OTHER): Payer: Medicare Other | Admitting: Ophthalmology

## 2019-08-07 DIAGNOSIS — H353133 Nonexudative age-related macular degeneration, bilateral, advanced atrophic without subfoveal involvement: Secondary | ICD-10-CM | POA: Diagnosis not present

## 2019-08-07 DIAGNOSIS — H43812 Vitreous degeneration, left eye: Secondary | ICD-10-CM | POA: Diagnosis not present

## 2019-08-07 DIAGNOSIS — H35352 Cystoid macular degeneration, left eye: Secondary | ICD-10-CM

## 2019-08-07 DIAGNOSIS — H353221 Exudative age-related macular degeneration, left eye, with active choroidal neovascularization: Secondary | ICD-10-CM

## 2019-08-07 MED ORDER — BEVACIZUMAB CHEMO INJECTION 1.25MG/0.05ML SYRINGE FOR KALEIDOSCOPE
1.2500 mg | INTRAVITREAL | Status: AC | PRN
  Administered 2019-08-07: 1.25 mg via INTRAVITREAL

## 2019-08-07 NOTE — Assessment & Plan Note (Signed)
The nature of wet macular degeneration was discussed with the patient.  Forms of therapy reviewed include the use of Anti-VEGF medications injected painlessly into the eye, as well as other possible treatment modalities, including thermal laser therapy. Fellow eye involvement and risks were discussed with the patient. Upon the finding of wet age related macular degeneration, treatment will be offered. The treatment regimen is on a treat as needed basis with the intent to treat if necessary and extend interval of exams when possible. On average 1 out of 6 patients do not need lifetime therapy. However, the risk of recurrent disease is high for a lifetime.  Initially monthly, then periodic, examinations and evaluations will determine whether the next treatment is required on the day of the examination.

## 2019-08-07 NOTE — Assessment & Plan Note (Signed)
The nature of age--related macular degeneration was discussed with the patient as well as the distinction between dry and wet types. Checking an Amsler Grid daily with advice to return immediately should a distortion develop, was given to the patient. The patient 's smoking status now and in the past was determined and advice based on the AREDS study was provided regarding the consumption of antioxidant supplements. AREDS 2 vitamin formulation was recommended. Consumption of dark leafy vegetables and fresh fruits of various colors was recommended. Treatment modalities for wet macular degeneration particularly the use of intravitreal injections of anti-blood vessel growth factors was discussed with the patient. Avastin, Lucentis, and Eylea are the available options. On occasion, therapy includes the use of photodynamic therapy and thermal laser. Stressed to the patient do not rub eyes. All patient questions were answered.

## 2019-08-07 NOTE — Progress Notes (Signed)
08/07/2019     CHIEF COMPLAINT Patient presents for Retina Follow Up   HISTORY OF PRESENT ILLNESS: Cindy [name] is a 86 y.o. female who presents to the clinic today for:   HPI    Retina Follow Up    Patient presents with  Wet AMD.  In left eye.  Severity is moderate.  Since onset it is gradually improving.  I, the attending physician,  performed the HPI with the patient and updated documentation appropriately.          Comments    10 Week AMD f\u OS. Possible Avastin OS. OC  Pt is now taking 10 MG Eliquis  Daily due to blood clot in lung.  Pt has noticed a decrease in OS vision. Has a hard time reading.       Last edited by Hurman Horn, MD on 08/07/2019 10:15 AM. (History)      Referring physician: Celene Squibb, MD [place],  [place] [id number]  HISTORICAL INFORMATION:   Selected notes from the MEDICAL RECORD NUMBER    Lab Results  Component Value Date   HGBA1C 6.1 (H) 03/14/2013     CURRENT MEDICATIONS: No current outpatient medications on file. (Ophthalmic Drugs)   No current facility-administered medications for this visit. (Ophthalmic Drugs)   Current Outpatient Medications (Other)  Medication Sig  . acetaminophen (TYLENOL) 325 MG tablet Take 325 mg by mouth every 4 (four) hours as needed for mild pain, moderate pain or headache.   . ALPRAZolam (XANAX) 0.5 MG tablet Take 1 tablet (0.5 mg total) by mouth at bedtime as needed for anxiety.  Marland Kitchen apixaban (ELIQUIS) 5 MG TABS tablet Take 2 tablets by mouth twice a day for 7 days; and after that 1 tablet twice a day on daily basis.  . calcium carbonate (OS-CAL) 600 MG TABS tablet Take 600 mg by mouth daily with breakfast.  . cholecalciferol (VITAMIN D) 1000 UNITS tablet Take 1,000 Units by mouth daily.    . CYANOCOBALAMIN PO Take 1 tablet by mouth daily.  Marland Kitchen esomeprazole (NEXIUM) 40 MG capsule Take 40 mg by mouth daily as needed.   . fentaNYL (DURAGESIC) 25 MCG/HR Place 1 patch onto the skin  every 3 (three) days.  Marland Kitchen levothyroxine (SYNTHROID) 100 MCG tablet Take 100 mcg by mouth every morning.  . metoprolol succinate (TOPROL-XL) 25 MG 24 hr tablet Take 0.5-1 tablets (12.5-25 mg total) by mouth in the morning and at bedtime. Patient takes 1 tablet (52m) in the morning and 1/2 of a tablet (12.587m in the evening  . Multiple Vitamins-Minerals (WOMENS HAIR, SKIN & NAILS PO) Take 1 tablet by mouth daily.  . Potassium Chloride ER 20 MEQ TBCR Take 1 tablet by mouth daily.  . sodium chloride (OCEAN) 0.65 % nasal spray Place 2 sprays into the nose daily as needed.   . torsemide (DEMADEX) 20 MG tablet Take 20 mg by mouth daily.  . Multiple Vitamin (MULTIVITAMIN ADULT PO) Take by mouth.   No current facility-administered medications for this visit. (Other)      REVIEW OF SYSTEMS:    ALLERGIES Allergies  Allergen Reactions  . Nebivolol Hcl     Other reaction(s): Other (See Comments) Chest tightness,sob,cough,severe tiredness,confusion  . Codeine Nausea Only  . Sulfa Antibiotics Nausea Only  . Sulfonamide Derivatives Nausea Only    PAST MEDICAL HISTORY Past Medical History:  Diagnosis Date  . Cancer ([place]  . CHF (congestive heart failure) ([place]  Amyloidosis  . Diarrhea   . Diverticulosis of colon (without mention of hemorrhage)   . Edema   . Esophagitis, unspecified   . Family history of malignant neoplasm of gastrointestinal tract   . Fibrosis of lung ([place]) 03/14/2013  . Flatulence, eructation, and gas pain   . GERD (gastroesophageal reflux disease)   . Left sided ulcerative colitis ([place])   . Maxillary sinus mass   . Osteoporosis, unspecified   . Other and unspecified hyperlipidemia   . Other and unspecified noninfectious gastroenteritis and colitis(558.9)   . Paroxysmal atrial fibrillation ([place]) 07/09/2019  . Stricture and stenosis of esophagus   . Unspecified hypothyroidism   . URI (upper respiratory infection)    Past Surgical History:  Procedure Laterality  Date  . ABDOMINAL HYSTERECTOMY    . CATARACT EXTRACTION W/PHACO Bilateral    Dr. Gershon Crane  . CHOLECYSTECTOMY    . FOOT SURGERY    . THYROIDECTOMY, PARTIAL    . YAG LASER APPLICATION Right 9/93/7169   Procedure: YAG LASER APPLICATION;  Surgeon: Elta Guadeloupe T. Gershon Crane, MD;  Location: [facility];  Service: Ophthalmology;  Laterality: Right;    FAMILY HISTORY Family History  Problem Relation Age of Onset  . Colon cancer Mother   . Heart attack Father        CHF  . Stomach cancer Sister   . Diabetes Son   . Kidney cancer Son   . Adrenal disorder Son   . Heart disease Brother     SOCIAL HISTORY Social History   Tobacco Use  . Smoking status: Never Smoker  . Smokeless tobacco: Never Used  Substance Use Topics  . Alcohol use: No    Alcohol/week: 0.0 standard drinks  . Drug use: No         OPHTHALMIC EXAM: Base Eye Exam    Visual Acuity (Snellen - Linear)      Right Left   Dist [place] 20/40 E Card @ 4'   Dist ph  NI 20/100       Tonometry (Tonopen, 9:34 AM)      Right Left   Pressure 11 10       Pupils      Dark Light Shape React APD   Right 4 3 Round Sluggish None   Left 4 3 Round Sluggish None       Visual Fields (Counting fingers)      Left Right    Full Full       Dilation    Left eye: 1.0% Mydriacyl, 2.5% Phenylephrine @ 9:34 AM        Slit Lamp and Fundus Exam    Fundus Exam      Right Left   C/D Ratio  0.1          IMAGING AND PROCEDURES  Imaging and Procedures for 08/07/19           ASSESSMENT/PLAN:  No problem-specific Assessment & Plan notes found for this encounter.      ICD-10-CM   1. Exudative age-related macular degeneration of left eye with active choroidal neovascularization (HCC)  H35.3221 OCT, Retina - OU - Both Eyes  2. Cystoid macular edema of left eye  H35.352 OCT, Retina - OU - Both Eyes  3. Posterior vitreous detachment of left eye  H43.812 OCT, Retina - OU - Both Eyes    1.  2.  3.  Ophthalmic Meds Ordered this  visit:  No orders of the defined types were placed in this encounter.  No follow-ups on file.  There are no Patient Instructions on file for this visit.   Explained the diagnoses, plan, and follow up with the patient and they expressed understanding.  Patient expressed understanding of the importance of proper follow up care.   Clent Demark Lirio Bach M.D. Diseases & Surgery of the Retina and Vitreous [facility] 08/07/19     Abbreviations: M myopia (nearsighted); A astigmatism; H hyperopia (farsighted); P presbyopia; Mrx spectacle prescription;  CTL contact lenses; OD right eye; OS left eye; OU both eyes  XT exotropia; ET esotropia; PEK punctate epithelial keratitis; PEE punctate epithelial erosions; DES dry eye syndrome; MGD meibomian gland dysfunction; ATs artificial tears; PFAT's preservative free artificial tears; [place] nuclear sclerotic cataract; PSC posterior subcapsular cataract; ERM epi-retinal membrane; PVD posterior vitreous detachment; RD retinal detachment; DM diabetes mellitus; DR diabetic retinopathy; NPDR non-proliferative diabetic retinopathy; PDR proliferative diabetic retinopathy; CSME clinically significant macular edema; DME diabetic macular edema; dbh dot blot hemorrhages; CWS cotton wool spot; POAG primary open angle glaucoma; C/D cup-to-disc ratio; HVF humphrey visual field; GVF goldmann visual field; OCT optical coherence tomography; IOP intraocular pressure; BRVO Branch retinal vein occlusion; CRVO central retinal vein occlusion; CRAO central retinal artery occlusion; BRAO branch retinal artery occlusion; RT retinal tear; SB scleral buckle; PPV pars plana vitrectomy; VH Vitreous hemorrhage; PRP panretinal laser photocoagulation; IVK intravitreal kenalog; VMT vitreomacular traction; MH Macular hole;  NVD neovascularization of the disc; NVE neovascularization elsewhere; AREDS age related eye disease study; ARMD age related macular degeneration; POAG primary  open angle glaucoma; EBMD epithelial/anterior basement membrane dystrophy; ACIOL anterior chamber intraocular lens; IOL intraocular lens; PCIOL posterior chamber intraocular lens; Phaco/IOL phacoemulsification with intraocular lens placement; [place] photorefractive keratectomy; LASIK laser assisted in situ keratomileusis; HTN hypertension; DM diabetes mellitus; COPD chronic obstructive pulmonary disease

## 2019-08-09 DIAGNOSIS — N390 Urinary tract infection, site not specified: Secondary | ICD-10-CM | POA: Diagnosis not present

## 2019-08-09 DIAGNOSIS — G894 Chronic pain syndrome: Secondary | ICD-10-CM | POA: Diagnosis not present

## 2019-08-09 DIAGNOSIS — R7301 Impaired fasting glucose: Secondary | ICD-10-CM | POA: Diagnosis not present

## 2019-08-09 DIAGNOSIS — R21 Rash and other nonspecific skin eruption: Secondary | ICD-10-CM | POA: Diagnosis not present

## 2019-08-09 DIAGNOSIS — I509 Heart failure, unspecified: Secondary | ICD-10-CM | POA: Diagnosis not present

## 2019-08-09 DIAGNOSIS — Z0189 Encounter for other specified special examinations: Secondary | ICD-10-CM | POA: Diagnosis not present

## 2019-08-09 DIAGNOSIS — E859 Amyloidosis, unspecified: Secondary | ICD-10-CM | POA: Diagnosis not present

## 2019-08-09 DIAGNOSIS — E039 Hypothyroidism, unspecified: Secondary | ICD-10-CM | POA: Diagnosis not present

## 2019-08-14 DIAGNOSIS — K219 Gastro-esophageal reflux disease without esophagitis: Secondary | ICD-10-CM | POA: Diagnosis not present

## 2019-08-14 DIAGNOSIS — G894 Chronic pain syndrome: Secondary | ICD-10-CM | POA: Diagnosis not present

## 2019-08-14 DIAGNOSIS — E859 Amyloidosis, unspecified: Secondary | ICD-10-CM | POA: Diagnosis not present

## 2019-08-14 DIAGNOSIS — R945 Abnormal results of liver function studies: Secondary | ICD-10-CM | POA: Diagnosis not present

## 2019-08-14 DIAGNOSIS — E039 Hypothyroidism, unspecified: Secondary | ICD-10-CM | POA: Diagnosis not present

## 2019-08-14 DIAGNOSIS — R7303 Prediabetes: Secondary | ICD-10-CM | POA: Diagnosis not present

## 2019-08-14 DIAGNOSIS — I509 Heart failure, unspecified: Secondary | ICD-10-CM | POA: Diagnosis not present

## 2019-08-14 DIAGNOSIS — E785 Hyperlipidemia, unspecified: Secondary | ICD-10-CM | POA: Diagnosis not present

## 2019-08-22 DIAGNOSIS — E8581 Light chain (AL) amyloidosis: Secondary | ICD-10-CM | POA: Diagnosis not present

## 2019-08-22 DIAGNOSIS — M546 Pain in thoracic spine: Secondary | ICD-10-CM | POA: Diagnosis not present

## 2019-08-22 DIAGNOSIS — H3530 Unspecified macular degeneration: Secondary | ICD-10-CM | POA: Diagnosis not present

## 2019-08-22 DIAGNOSIS — I509 Heart failure, unspecified: Secondary | ICD-10-CM | POA: Diagnosis not present

## 2019-08-22 DIAGNOSIS — K219 Gastro-esophageal reflux disease without esophagitis: Secondary | ICD-10-CM | POA: Diagnosis not present

## 2019-08-22 DIAGNOSIS — I43 Cardiomyopathy in diseases classified elsewhere: Secondary | ICD-10-CM | POA: Diagnosis not present

## 2019-08-22 DIAGNOSIS — E854 Organ-limited amyloidosis: Secondary | ICD-10-CM | POA: Diagnosis not present

## 2019-08-22 DIAGNOSIS — D61818 Other pancytopenia: Secondary | ICD-10-CM | POA: Diagnosis not present

## 2019-08-22 DIAGNOSIS — G8929 Other chronic pain: Secondary | ICD-10-CM | POA: Diagnosis not present

## 2019-08-22 DIAGNOSIS — I4891 Unspecified atrial fibrillation: Secondary | ICD-10-CM | POA: Diagnosis not present

## 2019-08-23 DIAGNOSIS — E039 Hypothyroidism, unspecified: Secondary | ICD-10-CM | POA: Diagnosis not present

## 2019-08-23 DIAGNOSIS — I509 Heart failure, unspecified: Secondary | ICD-10-CM | POA: Diagnosis not present

## 2019-08-23 DIAGNOSIS — G894 Chronic pain syndrome: Secondary | ICD-10-CM | POA: Diagnosis not present

## 2019-08-23 DIAGNOSIS — F4542 Pain disorder with related psychological factors: Secondary | ICD-10-CM | POA: Diagnosis not present

## 2019-09-15 ENCOUNTER — Other Ambulatory Visit: Payer: Self-pay | Admitting: *Deleted

## 2019-09-15 DIAGNOSIS — R609 Edema, unspecified: Secondary | ICD-10-CM

## 2019-09-20 ENCOUNTER — Other Ambulatory Visit: Payer: Self-pay

## 2019-09-20 ENCOUNTER — Encounter: Payer: Self-pay | Admitting: Vascular Surgery

## 2019-09-20 ENCOUNTER — Ambulatory Visit (HOSPITAL_COMMUNITY)
Admission: RE | Admit: 2019-09-20 | Discharge: 2019-09-20 | Disposition: A | Discharge status: Home / Self Care | Payer: Medicare Other | Source: Ambulatory Visit | Attending: Vascular Surgery | Admitting: Vascular Surgery

## 2019-09-20 ENCOUNTER — Ambulatory Visit (INDEPENDENT_AMBULATORY_CARE_PROVIDER_SITE_OTHER): Payer: Medicare Other | Admitting: Vascular Surgery

## 2019-09-20 VITALS — BP 96/59 | HR 77 | Temp 97.3°F | Resp 18 | Ht 67.5 in | Wt 125.5 lb

## 2019-09-20 DIAGNOSIS — I83812 Varicose veins of left lower extremities with pain: Secondary | ICD-10-CM

## 2019-09-20 DIAGNOSIS — R609 Edema, unspecified: Secondary | ICD-10-CM | POA: Diagnosis not present

## 2019-09-20 NOTE — Progress Notes (Signed)
Referring Physician: Donneta Romberg  Patient name: Cindy [name] MRN: [id number] DOB: [birth year] Sex: female  REASON FOR CONSULT: Left leg varicose veins and swelling  HPI: Cindy [name] is a 86 y.o. 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  She was recently noticed to have a pulmonary embolus on presentation to [facility].  She was also noted at that time to have left leg swelling.  She states the leg has been swollen since January.  She had a DVT ultrasound at [facility] which showed no DVT in the left leg.  She has had fairly acute presentation also varicose veins in the left leg.  He was only appeared in the last 2 to 3 months.  She does not really complain of significant pain but states that the swelling in her left leg is a little bit bothersome.  She has had no prior skin breakdown.  She has had previous chemotherapy for her cancer.  She is currently not receiving medication for this.  She is currently on Eliquis.    Past Medical History:  Diagnosis Date  . Cancer ([place])   . CHF (congestive heart failure) (HCC)    Amyloidosis  . Diarrhea   . Diverticulosis of colon (without mention of hemorrhage)   . Edema   . Esophagitis, unspecified   . Family history of malignant neoplasm of gastrointestinal tract   . Fibrosis of lung ([place]) 03/14/2013  . Flatulence, eructation, and gas pain   . GERD (gastroesophageal reflux disease)   . Left sided ulcerative colitis ([place])   . Maxillary sinus mass   . Osteoporosis, unspecified   . Other and unspecified hyperlipidemia   . Other and unspecified noninfectious gastroenteritis and colitis(558.9)   . Paroxysmal atrial fibrillation ([place]) 07/09/2019  . Stricture and stenosis of esophagus   . Unspecified hypothyroidism   . URI (upper respiratory infection)    Past Surgical History:  Procedure Laterality Date  . ABDOMINAL HYSTERECTOMY    . CATARACT EXTRACTION W/PHACO  Bilateral    Dr. Gershon Crane  . CHOLECYSTECTOMY    . FOOT SURGERY    . THYROIDECTOMY, PARTIAL    . YAG LASER APPLICATION Right 11/06/9468   Procedure: YAG LASER APPLICATION;  Surgeon: Elta Guadeloupe T. Gershon Crane, MD;  Location: [facility];  Service: Ophthalmology;  Laterality: Right;    Family History  Problem Relation Age of Onset  . Colon cancer Mother   . Heart attack Father        CHF  . Stomach cancer Sister   . Diabetes Son   . Kidney cancer Son   . Adrenal disorder Son   . Heart disease Brother     SOCIAL HISTORY: Social History   Socioeconomic History  . Marital status: Married    Spouse name: Not on file  . Number of children: 2  . Years of education: Not on file  . Highest education level: Not on file  Occupational History  . Occupation: Retired  Tobacco Use  . Smoking status: Never Smoker  . Smokeless tobacco: Never Used  Substance and Sexual Activity  . Alcohol use: No    Alcohol/week: 0.0 standard drinks  . Drug use: No  . Sexual activity: Yes    Birth control/protection: Surgical  Other Topics Concern  . Not on file  Social History Narrative  . Not on file   Social Determinants of Health   Financial Resource Strain:   . Difficulty of  Paying Living Expenses:   Food Insecurity:   . Worried About Charity fundraiser in the Last Year:   . Arboriculturist in the Last Year:   Transportation Needs:   . Film/video editor (Medical):   Marland Kitchen Lack of Transportation (Non-Medical):   Physical Activity:   . Days of Exercise per Week:   . Minutes of Exercise per Session:   Stress:   . Feeling of Stress :   Social Connections:   . Frequency of Communication with Friends and Family:   . Frequency of Social Gatherings with Friends and Family:   . Attends Religious Services:   . Active Member of Clubs or Organizations:   . Attends Archivist Meetings:   Marland Kitchen Marital Status:   Intimate Partner Violence:   . Fear of Current or Ex-Partner:   . Emotionally Abused:   Marland Kitchen  Physically Abused:   . Sexually Abused:     Allergies  Allergen Reactions  . Nebivolol Hcl     Other reaction(s): Other (See Comments) Chest tightness,sob,cough,severe tiredness,confusion  . Codeine Nausea Only  . Sulfa Antibiotics Nausea Only  . Sulfonamide Derivatives Nausea Only    Current Outpatient Medications  Medication Sig Dispense Refill  . acetaminophen (TYLENOL) 325 MG tablet Take 325 mg by mouth every 4 (four) hours as needed for mild pain, moderate pain or headache.     . ALPRAZolam (XANAX) 0.5 MG tablet Take 1 tablet (0.5 mg total) by mouth at bedtime as needed for anxiety. 30 tablet 3  . apixaban (ELIQUIS) 5 MG TABS tablet Take 2 tablets by mouth twice a day for 7 days; and after that 1 tablet twice a day on daily basis. 60 tablet 2  . calcium carbonate (OS-CAL) 600 MG TABS tablet Take 600 mg by mouth daily with breakfast.    . cholecalciferol (VITAMIN D) 1000 UNITS tablet Take 1,000 Units by mouth daily.      . CYANOCOBALAMIN PO Take 1 tablet by mouth daily.    Marland Kitchen esomeprazole (NEXIUM) 40 MG capsule Take 40 mg by mouth daily as needed.     . fentaNYL (DURAGESIC) 25 MCG/HR Place 1 patch onto the skin every 3 (three) days.    Marland Kitchen levothyroxine (SYNTHROID) 100 MCG tablet Take 100 mcg by mouth every morning.    . metoprolol succinate (TOPROL-XL) 25 MG 24 hr tablet Take 0.5-1 tablets (12.5-25 mg total) by mouth in the morning and at bedtime. Patient takes 1 tablet (54m) in the morning and 1/2 of a tablet (12.551m in the evening    . Multiple Vitamin (MULTIVITAMIN ADULT PO) Take by mouth.    . Multiple Vitamins-Minerals (WOMENS HAIR, SKIN & NAILS PO) Take 1 tablet by mouth daily.    . Potassium Chloride ER 20 MEQ TBCR Take 1 tablet by mouth daily.    . sodium chloride (OCEAN) 0.65 % nasal spray Place 2 sprays into the nose daily as needed.     . torsemide (DEMADEX) 20 MG tablet Take 20 mg by mouth daily.     No current facility-administered medications for this visit.     ROS:   General:  No weight loss, Fever, chills  HEENT: No recent headaches, no nasal bleeding, no visual changes, no sore throat  Neurologic: No dizziness, blackouts, seizures. No recent symptoms of stroke or mini- stroke. No recent episodes of slurred speech, or temporary blindness.  Cardiac: No recent episodes of chest pain/pressure, no shortness of breath at rest.  No  shortness of breath with exertion.  Denies history of atrial fibrillation or irregular heartbeat  Vascular: No history of rest pain in feet.  No history of claudication.  No history of non-healing ulcer, No history of DVT   Pulmonary: No home oxygen, no productive cough, no hemoptysis,  No asthma or wheezing  Musculoskeletal:  [ ]  Arthritis, [ ]  Low back pain,  [ ]  Joint pain  Hematologic:No history of hypercoagulable state.  No history of easy bleeding.  No history of anemia  Gastrointestinal: No hematochezia or melena,  No gastroesophageal reflux, no trouble swallowing  Urinary: [ ]  chronic Kidney disease, [ ]  on HD - [ ]  MWF or [ ]  TTHS, [ ]  Burning with urination, [ ]  Frequent urination, [ ]  Difficulty urinating;   Skin: No rashes  Psychological: No history of anxiety,  No history of depression   Physical Examination  Vitals:   09/20/19 1345  BP: (!) 96/59  Pulse: 77  Resp: 18  Temp: (!) 97.3 F (36.3 C)  TempSrc: Temporal  SpO2: 96%  Weight: 125 lb 8 oz (56.9 kg)  Height: 5' 7.5" (1.715 m)    Body mass index is 19.37 kg/m.  General:  Alert and oriented, no acute distress HEENT: Normal Neck: No JVD Cardiac: Regular Rate and Rhythm Abdomen: Soft, non-tender, non-distended, no mass Skin: No rash, diffuse varicosities extending from the left thigh all the way down to the left calf and foot.  Leg is edematous from the left hip and to the left foot. Extremity Pulses:  2+ radial, brachial, femoral, dorsalis pedis, posterior tibial pulses bilaterally Musculoskeletal: No deformity has edema as  mentioned above Neurologic: Upper and lower extremity motor 5/5 and symmetric  DATA:  I reviewed the patient's previous DVT ultrasound which showed no evidence of DVT.  I did a SonoSite ultrasound of her left leg today which showed 4.5 to 5 mm left greater saphenous vein  ASSESSMENT: Varicose veins with swelling left leg.  However, fairly acute onset.  I have some concerns that she may have proximal obstruction has varicose veins do not usually present acutely.  She really has no symptoms in the right leg.   PLAN: CT venogram of abdomen and pelvis as well as duplex ultrasound of the iliac veins and cava left side in 3 months.  Patient was given a prescription today for lower extremity compression stockings to improve symptomatic relief.   Ruta Hinds, MD [facility] Office: [phone] Pager: [phone]

## 2019-09-21 ENCOUNTER — Other Ambulatory Visit: Payer: Self-pay

## 2019-09-21 DIAGNOSIS — I83812 Varicose veins of left lower extremities with pain: Secondary | ICD-10-CM

## 2019-09-22 ENCOUNTER — Other Ambulatory Visit: Payer: Self-pay | Admitting: *Deleted

## 2019-09-22 DIAGNOSIS — I83812 Varicose veins of left lower extremities with pain: Secondary | ICD-10-CM

## 2019-09-25 ENCOUNTER — Encounter (INDEPENDENT_AMBULATORY_CARE_PROVIDER_SITE_OTHER): Payer: Medicare Other | Admitting: Ophthalmology

## 2019-09-27 ENCOUNTER — Other Ambulatory Visit: Payer: Self-pay

## 2019-09-27 ENCOUNTER — Emergency Department (HOSPITAL_COMMUNITY)
Admission: EM | Admit: 2019-09-27 | Discharge: 2019-09-27 | Disposition: A | Discharge status: Home / Self Care | Payer: Medicare Other | Attending: Emergency Medicine | Admitting: Emergency Medicine

## 2019-09-27 ENCOUNTER — Encounter (HOSPITAL_COMMUNITY): Payer: Self-pay | Admitting: Emergency Medicine

## 2019-09-27 ENCOUNTER — Emergency Department (HOSPITAL_COMMUNITY): Payer: Medicare Other

## 2019-09-27 DIAGNOSIS — I2693 Single subsegmental pulmonary embolism without acute cor pulmonale: Secondary | ICD-10-CM | POA: Diagnosis not present

## 2019-09-27 DIAGNOSIS — R079 Chest pain, unspecified: Secondary | ICD-10-CM | POA: Diagnosis not present

## 2019-09-27 DIAGNOSIS — Z7901 Long term (current) use of anticoagulants: Secondary | ICD-10-CM | POA: Diagnosis not present

## 2019-09-27 DIAGNOSIS — I43 Cardiomyopathy in diseases classified elsewhere: Secondary | ICD-10-CM | POA: Diagnosis not present

## 2019-09-27 DIAGNOSIS — E854 Organ-limited amyloidosis: Secondary | ICD-10-CM | POA: Diagnosis not present

## 2019-09-27 DIAGNOSIS — R0789 Other chest pain: Secondary | ICD-10-CM | POA: Diagnosis not present

## 2019-09-27 DIAGNOSIS — R918 Other nonspecific abnormal finding of lung field: Secondary | ICD-10-CM | POA: Diagnosis not present

## 2019-09-27 DIAGNOSIS — N644 Mastodynia: Secondary | ICD-10-CM | POA: Diagnosis not present

## 2019-09-27 DIAGNOSIS — E871 Hypo-osmolality and hyponatremia: Secondary | ICD-10-CM | POA: Diagnosis not present

## 2019-09-27 DIAGNOSIS — Z5321 Procedure and treatment not carried out due to patient leaving prior to being seen by health care provider: Secondary | ICD-10-CM | POA: Insufficient documentation

## 2019-09-27 DIAGNOSIS — I480 Paroxysmal atrial fibrillation: Secondary | ICD-10-CM | POA: Diagnosis not present

## 2019-09-27 DIAGNOSIS — I444 Left anterior fascicular block: Secondary | ICD-10-CM | POA: Diagnosis not present

## 2019-09-27 DIAGNOSIS — I5032 Chronic diastolic (congestive) heart failure: Secondary | ICD-10-CM | POA: Diagnosis not present

## 2019-09-27 DIAGNOSIS — I2782 Chronic pulmonary embolism: Secondary | ICD-10-CM | POA: Diagnosis not present

## 2019-09-27 DIAGNOSIS — J90 Pleural effusion, not elsewhere classified: Secondary | ICD-10-CM | POA: Diagnosis not present

## 2019-09-27 DIAGNOSIS — Z950 Presence of cardiac pacemaker: Secondary | ICD-10-CM | POA: Diagnosis not present

## 2019-09-27 LAB — CBC
HCT: 41.7 % (ref 36.0–46.0)
Hemoglobin: 13.1 g/dL (ref 12.0–15.0)
MCH: 29.8 pg (ref 26.0–34.0)
MCHC: 31.4 g/dL (ref 30.0–36.0)
MCV: 95 fL (ref 80.0–100.0)
Platelets: 235 10*3/uL (ref 150–400)
RBC: 4.39 MIL/uL (ref 3.87–5.11)
RDW: 15.8 % — ABNORMAL HIGH (ref 11.5–15.5)
WBC: 8.1 10*3/uL (ref 4.0–10.5)
nRBC: 0 % (ref 0.0–0.2)

## 2019-09-27 LAB — BASIC METABOLIC PANEL
Anion gap: 9 (ref 5–15)
BUN: 15 mg/dL (ref 8–23)
CO2: 27 mmol/L (ref 22–32)
Calcium: 9.2 mg/dL (ref 8.9–10.3)
Chloride: 100 mmol/L (ref 98–111)
Creatinine, Ser: 0.72 mg/dL (ref 0.44–1.00)
GFR calc Af Amer: >60 mL/min (ref >60)
GFR calc non Af Amer: >60 mL/min (ref >60)
Glucose, Bld: 100 mg/dL — ABNORMAL HIGH (ref 70–99)
Potassium: 4.2 mmol/L (ref 3.5–5.1)
Sodium: 136 mmol/L (ref 135–145)

## 2019-09-27 LAB — TROPONIN I (HIGH SENSITIVITY): Troponin I (High Sensitivity): 4 ng/L (ref <18)

## 2019-09-27 MED ORDER — SODIUM CHLORIDE 0.9% FLUSH: 3.0000 mL | Freq: Once | INTRAVENOUS | Status: DC

## 2019-09-27 NOTE — ED Triage Notes (Signed)
Patient has pain under left breast that started around 11 am this morning, recently hospitalized for blood clot in left lung.

## 2019-10-02 ENCOUNTER — Other Ambulatory Visit: Payer: Self-pay

## 2019-10-02 ENCOUNTER — Ambulatory Visit (HOSPITAL_COMMUNITY)
Admission: RE | Admit: 2019-10-02 | Discharge: 2019-10-02 | Disposition: A | Discharge status: Home / Self Care | Payer: Medicare Other | Source: Ambulatory Visit | Attending: Vascular Surgery | Admitting: Vascular Surgery

## 2019-10-02 ENCOUNTER — Ambulatory Visit (INDEPENDENT_AMBULATORY_CARE_PROVIDER_SITE_OTHER)
Admission: RE | Admit: 2019-10-02 | Discharge: 2019-10-02 | Disposition: A | Discharge status: Home / Self Care | Payer: Medicare Other | Source: Ambulatory Visit | Attending: Vascular Surgery | Admitting: Vascular Surgery

## 2019-10-02 DIAGNOSIS — I83812 Varicose veins of left lower extremities with pain: Secondary | ICD-10-CM | POA: Diagnosis not present

## 2019-10-02 DIAGNOSIS — K5730 Diverticulosis of large intestine without perforation or abscess without bleeding: Secondary | ICD-10-CM | POA: Diagnosis not present

## 2019-10-02 MED ORDER — IOHEXOL 350 MG/ML SOLN
150.0000 mL | Freq: Once | INTRAVENOUS | Status: AC | PRN
  Administered 2019-10-02: 125 mL via INTRAVENOUS

## 2019-10-11 ENCOUNTER — Ambulatory Visit (INDEPENDENT_AMBULATORY_CARE_PROVIDER_SITE_OTHER): Payer: Medicare Other | Admitting: Vascular Surgery

## 2019-10-11 ENCOUNTER — Encounter: Payer: Self-pay | Admitting: Vascular Surgery

## 2019-10-11 ENCOUNTER — Other Ambulatory Visit: Payer: Self-pay

## 2019-10-11 VITALS — BP 105/61 | HR 78 | Temp 97.3°F | Resp 18 | Ht 68.0 in | Wt 127.9 lb

## 2019-10-11 DIAGNOSIS — I83812 Varicose veins of left lower extremities with pain: Secondary | ICD-10-CM | POA: Diagnosis not present

## 2019-10-11 NOTE — Progress Notes (Signed)
Patient is an 86 year old female who returns for follow-up today.  She was recently seen for acute onset varicose veins in the left leg.  She does have a previous history of amyloidosis cardiac and bone marrow involvement.  She also recently had a pulmonary embolus and is currently on anticoagulation.  At the time of her pulmonary embolus she was noted to have some left leg swelling.  Patient states her left leg has been swollen since January.  She has had multiple DVT ultrasounds which showed no DVT.  She is not really bothered by the varicose veins except for the fact she has some swelling in her left leg.  Compression stockings have improved this a little bit.  She does not really have any pain.  She was recently seen at [facility] and had a repeat CT scan which showed that there may have been some mild change to the pulmonary embolus.  Review of systems: She has no shortness of breath.  She has no chest pain.    Current Outpatient Medications on File Prior to Visit  Medication Sig Dispense Refill  . acetaminophen (TYLENOL) 325 MG tablet Take 325 mg by mouth every 4 (four) hours as needed for mild pain, moderate pain or headache.     . ALPRAZolam (XANAX) 0.5 MG tablet Take 1 tablet (0.5 mg total) by mouth at bedtime as needed for anxiety. 30 tablet 3  . apixaban (ELIQUIS) 5 MG TABS tablet Take 2 tablets by mouth twice a day for 7 days; and after that 1 tablet twice a day on daily basis. 60 tablet 2  . calcium carbonate (OS-CAL) 600 MG TABS tablet Take 600 mg by mouth daily with breakfast.    . cholecalciferol (VITAMIN D) 1000 UNITS tablet Take 1,000 Units by mouth daily.      . CYANOCOBALAMIN PO Take 1 tablet by mouth daily.    Marland Kitchen esomeprazole (NEXIUM) 40 MG capsule Take 40 mg by mouth daily as needed.     . fentaNYL (DURAGESIC) 25 MCG/HR Place 1 patch onto the skin every 3 (three) days.    Marland Kitchen levothyroxine (SYNTHROID) 100 MCG tablet Take 100 mcg by mouth every morning.    . metoprolol succinate  (TOPROL-XL) 25 MG 24 hr tablet Take 0.5-1 tablets (12.5-25 mg total) by mouth in the morning and at bedtime. Patient takes 1 tablet (65m) in the morning and 1/2 of a tablet (12.523m in the evening    . Multiple Vitamin (MULTIVITAMIN ADULT PO) Take by mouth.    . Multiple Vitamins-Minerals (WOMENS HAIR, SKIN & NAILS PO) Take 1 tablet by mouth daily.    . Potassium Chloride ER 20 MEQ TBCR Take 1 tablet by mouth daily.    . sodium chloride (OCEAN) 0.65 % nasal spray Place 2 sprays into the nose daily as needed.     . torsemide (DEMADEX) 20 MG tablet Take 20 mg by mouth daily.     No current facility-administered medications on file prior to visit.    Social History   Socioeconomic History  . Marital status: Married    Spouse name: Not on file  . Number of children: 2  . Years of education: Not on file  . Highest education level: Not on file  Occupational History  . Occupation: Retired  Tobacco Use  . Smoking status: Never Smoker  . Smokeless tobacco: Never Used  Substance and Sexual Activity  . Alcohol use: No    Alcohol/week: 0.0 standard drinks  . Drug use: No  .  Sexual activity: Yes    Birth control/protection: Surgical  Other Topics Concern  . Not on file  Social History Narrative  . Not on file   Social Determinants of Health   Financial Resource Strain:   . Difficulty of Paying Living Expenses:   Food Insecurity:   . Worried About Charity fundraiser in the Last Year:   . Arboriculturist in the Last Year:   Transportation Needs:   . Film/video editor (Medical):   Marland Kitchen Lack of Transportation (Non-Medical):   Physical Activity:   . Days of Exercise per Week:   . Minutes of Exercise per Session:   Stress:   . Feeling of Stress :   Social Connections:   . Frequency of Communication with Friends and Family:   . Frequency of Social Gatherings with Friends and Family:   . Attends Religious Services:   . Active Member of Clubs or Organizations:   . Attends Theatre manager Meetings:   Marland Kitchen Marital Status:   Intimate Partner Violence:   . Fear of Current or Ex-Partner:   . Emotionally Abused:   Marland Kitchen Physically Abused:   . Sexually Abused:     Physical exam:  Vitals:   10/11/19 1408  BP: 105/61  Pulse: 78  Resp: 18  Temp: (!) 97.3 F (36.3 C)  TempSrc: Temporal  SpO2: 97%  Weight: 127 lb 14.4 oz (58 kg)  Height: 5' 8"  (1.727 m)    Extremities: Left and right lower extremities with trace ankle edema left leg is slightly swollen more than the left.  Varicose veins unchanged from previous exam on June 2 of this year.  Data: CT scan of the abdomen and pelvis to evaluate for malignant progression was reviewed and there was no external compression of the veins and no evidence of significant lymph node enlargement.  Assessment: Mildly symptomatic left leg varicose veins in an 86 year old with other multiple underlying problems including recent pulmonary embolus.  Plan: Since her symptoms are fairly mild at this point I do not believe that she would have benefit from a laser ablation to necessitate the risk involved.  Even though overall this is a fairly low risk procedure she has multiple medical problems and is fairly frail.  At this point she has opted for compression therapy alone.  If her veins continue to enlarge over time or she develops more pain symptoms in the left leg we could certainly consider laser ablation in the future.  Otherwise she will follow up on an as-needed basis.  Ruta Hinds, MD [facility] Office: [phone]   Assessment: Varicose veins mildly symptomatic 4.5 to 5 mm diameter with some reflux.

## 2019-10-12 ENCOUNTER — Ambulatory Visit: Payer: Medicare Other | Admitting: Vascular Surgery

## 2019-10-16 ENCOUNTER — Ambulatory Visit (INDEPENDENT_AMBULATORY_CARE_PROVIDER_SITE_OTHER): Payer: Medicare Other | Admitting: Ophthalmology

## 2019-10-16 ENCOUNTER — Encounter (INDEPENDENT_AMBULATORY_CARE_PROVIDER_SITE_OTHER): Payer: Self-pay | Admitting: Ophthalmology

## 2019-10-16 ENCOUNTER — Other Ambulatory Visit: Payer: Self-pay

## 2019-10-16 DIAGNOSIS — H353221 Exudative age-related macular degeneration, left eye, with active choroidal neovascularization: Secondary | ICD-10-CM

## 2019-10-16 DIAGNOSIS — H353133 Nonexudative age-related macular degeneration, bilateral, advanced atrophic without subfoveal involvement: Secondary | ICD-10-CM

## 2019-10-16 MED ORDER — BEVACIZUMAB CHEMO INJECTION 1.25MG/0.05ML SYRINGE FOR KALEIDOSCOPE
1.2500 mg | INTRAVITREAL | Status: AC | PRN
  Administered 2019-10-16: 1.25 mg via INTRAVITREAL

## 2019-10-16 NOTE — Assessment & Plan Note (Signed)
Currently OS is improved at 10-week interval post intravitreal Avastin, will repeat today to maintain and then examination in 12 weeks

## 2019-10-16 NOTE — Progress Notes (Signed)
10/16/2019     CHIEF COMPLAINT Patient presents for Retina Follow Up   HISTORY OF PRESENT ILLNESS: Cindy [name] is a 86 y.o. female who presents to the clinic today for:   HPI    Retina Follow Up    Patient presents with  Wet AMD.  In left eye.  This started 10 weeks ago.  Severity is moderate.  Duration of 10 weeks.  Since onset it is stable.          Comments    10 Week AMD F/U OU, poss Avastin OS  Pt c/o difficulty seeing bottom line on TV with OS. Pt denies any new symptoms.       Last edited by Rockie Neighbours, [place] on 10/16/2019  9:27 AM. (History)      Referring physician: Celene Squibb, MD [place],  [place] [address]  HISTORICAL INFORMATION:   Selected notes from the MEDICAL RECORD NUMBER    Lab Results  Component Value Date   HGBA1C 6.1 (H) 03/14/2013     CURRENT MEDICATIONS: No current outpatient medications on file. (Ophthalmic Drugs)   No current facility-administered medications for this visit. (Ophthalmic Drugs)   Current Outpatient Medications (Other)  Medication Sig  . acetaminophen (TYLENOL) 325 MG tablet Take 325 mg by mouth every 4 (four) hours as needed for mild pain, moderate pain or headache.   . ALPRAZolam (XANAX) 0.5 MG tablet Take 1 tablet (0.5 mg total) by mouth at bedtime as needed for anxiety.  Marland Kitchen apixaban (ELIQUIS) 5 MG TABS tablet Take 2 tablets by mouth twice a day for 7 days; and after that 1 tablet twice a day on daily basis.  . calcium carbonate (OS-CAL) 600 MG TABS tablet Take 600 mg by mouth daily with breakfast.  . cholecalciferol (VITAMIN D) 1000 UNITS tablet Take 1,000 Units by mouth daily.    . CYANOCOBALAMIN PO Take 1 tablet by mouth daily.  Marland Kitchen esomeprazole (NEXIUM) 40 MG capsule Take 40 mg by mouth daily as needed.   . fentaNYL (DURAGESIC) 25 MCG/HR Place 1 patch onto the skin every 3 (three) days.  Marland Kitchen levothyroxine (SYNTHROID) 100 MCG tablet Take 100 mcg by mouth every morning.  . metoprolol succinate  (TOPROL-XL) 25 MG 24 hr tablet Take 0.5-1 tablets (12.5-25 mg total) by mouth in the morning and at bedtime. Patient takes 1 tablet (84m) in the morning and 1/2 of a tablet (12.574m in the evening  . Multiple Vitamin (MULTIVITAMIN ADULT PO) Take by mouth.  . Multiple Vitamins-Minerals (WOMENS HAIR, SKIN & NAILS PO) Take 1 tablet by mouth daily.  . Potassium Chloride ER 20 MEQ TBCR Take 1 tablet by mouth daily.  . sodium chloride (OCEAN) 0.65 % nasal spray Place 2 sprays into the nose daily as needed.   . torsemide (DEMADEX) 20 MG tablet Take 20 mg by mouth daily.   No current facility-administered medications for this visit. (Other)      REVIEW OF SYSTEMS:    ALLERGIES Allergies  Allergen Reactions  . Nebivolol Hcl     Other reaction(s): Other (See Comments) Chest tightness,sob,cough,severe tiredness,confusion  . Codeine Nausea Only  . Sulfa Antibiotics Nausea Only  . Sulfonamide Derivatives Nausea Only    PAST MEDICAL HISTORY Past Medical History:  Diagnosis Date  . [place]  . CHF (congestive heart failure) (HCC)    Amyloidosis  . Diarrhea   . Diverticulosis of colon (without mention of hemorrhage)   . Edema   .  Esophagitis, unspecified   . Family history of malignant neoplasm of gastrointestinal tract   . Fibrosis of lung ([place]) 03/14/2013  . Flatulence, eructation, and gas pain   . GERD (gastroesophageal reflux disease)   . Left sided ulcerative colitis ([place])   . Maxillary sinus mass   . Osteoporosis, unspecified   . Other and unspecified hyperlipidemia   . Other and unspecified noninfectious gastroenteritis and colitis(558.9)   . Paroxysmal atrial fibrillation ([place]) 07/09/2019  . Stricture and stenosis of esophagus   . Unspecified hypothyroidism   . URI (upper respiratory infection)    Past Surgical History:  Procedure Laterality Date  . ABDOMINAL HYSTERECTOMY    . CATARACT EXTRACTION W/PHACO Bilateral    Dr. Gershon Crane  . CHOLECYSTECTOMY    . FOOT  SURGERY    . THYROIDECTOMY, PARTIAL    . YAG LASER APPLICATION Right 10/07/5091   Procedure: YAG LASER APPLICATION;  Surgeon: Elta Guadeloupe T. Gershon Crane, MD;  Location: [facility];  Service: Ophthalmology;  Laterality: Right;    FAMILY HISTORY Family History  Problem Relation Age of Onset  . Colon cancer Mother   . Heart attack Father        CHF  . Stomach cancer Sister   . Diabetes Son   . Kidney cancer Son   . Adrenal disorder Son   . Heart disease Brother     SOCIAL HISTORY Social History   Tobacco Use  . Smoking status: Never Smoker  . Smokeless tobacco: Never Used  Substance Use Topics  . Alcohol use: No    Alcohol/week: 0.0 standard drinks  . Drug use: No         OPHTHALMIC EXAM:  Base Eye Exam    Visual Acuity (ETDRS)      Right Left   Dist [place] 20/40 20/400   Dist ph [place] NI 20/100 -1       Tonometry (Tonopen, 9:31 AM)      Right Left   Pressure 12 09       Pupils      Pupils Dark Light Shape React APD   Right PERRL 4 3 Round Brisk None   Left PERRL 4 3 Round Brisk None       Visual Fields (Counting fingers)      Left Right    Full Full       Extraocular Movement      Right Left    Full Full       Neuro/Psych    Oriented x3: Yes   Mood/Affect: Normal       Dilation    Both eyes: 1.0% Mydriacyl, 2.5% Phenylephrine @ 9:31 AM        Slit Lamp and Fundus Exam    Slit Lamp Exam      Right Left   Lids/Lashes  Normal   Conjunctiva/Sclera  White and quiet   Cornea  Clear   Anterior Chamber  Deep and quiet   Iris  Round and reactive   Lens  Posterior chamber intraocular lens   Anterior Vitreous  Normal       Fundus Exam      Right Left   Posterior Vitreous  ,,, Posterior vitreous detachment   Disc  Normal   C/D Ratio  0.1   Macula  Atrophy, Age related macular degeneration, Early age related macular degeneration, Drusen   Vessels  Normal   Periphery  Normal          IMAGING AND PROCEDURES  Imaging and  Procedures for 10/16/19  OCT,  Retina - OU - Both Eyes       Right Eye Central Foveal Thickness: 317. Progression has been stable. Findings include retinal drusen , no SRF, no IRF.   Left Eye Central Foveal Thickness: 280. Progression has improved. Findings include no SRF, no IRF, retinal drusen , subretinal scarring.        Intravitreal Injection, Pharmacologic Agent - OS - Left Eye       Time Out 10/16/2019. 10:39 AM. Confirmed correct patient, procedure, site, and patient consented.   Anesthesia Topical anesthesia was used. Anesthetic medications included Akten 3.5%.   Procedure Preparation included 10% betadine to eyelids, Tobramycin 0.3%. A 30 gauge needle was used.   Injection:  1.25 mg Bevacizumab (AVASTIN) SOLN   NDC: [address]-9, Lot: [id number]   Route: Intravitreal, Site: Left Eye, Waste: 0 mg  Post-op Post injection exam found visual acuity of at least counting fingers. The patient tolerated the procedure well. There were no complications. The patient received written and verbal post procedure care education. Post injection medications were not given.                 ASSESSMENT/PLAN:  Exudative age-related macular degeneration of left eye with active choroidal neovascularization (HCC) Currently OS is improved at 10-week interval post intravitreal Avastin, will repeat today to maintain and then examination in 12 weeks      ICD-10-CM   1. Exudative age-related macular degeneration of left eye with active choroidal neovascularization (HCC)  H35.3221 OCT, Retina - OU - Both Eyes    Intravitreal Injection, Pharmacologic Agent - OS - Left Eye    Bevacizumab (AVASTIN) SOLN 1.25 mg  2. Advanced nonexudative age-related macular degeneration of both eyes without subfoveal involvement  H35.3133     1.  2.  3.  Ophthalmic Meds Ordered this visit:  Meds ordered this encounter  Medications  . Bevacizumab (AVASTIN) SOLN 1.25 mg       Return in about 3 months (around 01/16/2020) for  AVASTIN OCT, OS, DILATE OU.  There are no Patient Instructions on file for this visit.   Explained the diagnoses, plan, and follow up with the patient and they expressed understanding.  Patient expressed understanding of the importance of proper follow up care.   Clent Demark Travell Desaulniers M.D. Diseases & Surgery of the Retina and Vitreous [facility]ina [facility] Di[facility] [place] 10/16/19     Abbreviations: M myopia (nearsighted); A astigmatism; H hyperopia (farsighted); P presbyopia; Mrx spectacle prescription;  CTL contact lenses; OD right eye; OS left eye; OU both eyes  XT exotropia; ET esotropia; PEK punctate epithelial keratitis; PEE punctate epithelial erosions; DES dry eye syndrome; MGD meibomian gland dysfunction; ATs artificial tears; PFAT's preservative free artificial tears; [place] nuclear sclerotic cataract; PSC posterior subcapsular cataract; ERM epi-retinal membrane; PVD posterior vitreous detachment; RD retinal detachment; DM diabetes mellitus; DR diabetic retinopathy; NPDR non-proliferative diabetic retinopathy; PDR proliferative diabetic retinopathy; CSME clinically significant macular edema; DME diabetic macular edema; dbh dot blot hemorrhages; CWS cotton wool spot; POAG primary open angle glaucoma; C/D cup-to-disc ratio; HVF humphrey visual field; GVF goldmann visual field; OCT optical coherence tomography; IOP intraocular pressure; BRVO Branch retinal vein occlusion; CRVO central retinal vein occlusion; CRAO central retinal artery occlusion; BRAO branch retinal artery occlusion; RT retinal tear; SB scleral buckle; PPV pars plana vitrectomy; VH Vitreous hemorrhage; PRP panretinal laser photocoagulation; IVK intravitreal kenalog; VMT vitreomacular traction; MH Macular hole;  NVD neovascularization of the disc; NVE neovascularization elsewhere; AREDS age  related eye disease study; ARMD age related macular degeneration; POAG primary open angle glaucoma; EBMD epithelial/anterior basement membrane dystrophy;  ACIOL anterior chamber intraocular lens; IOL intraocular lens; PCIOL posterior chamber intraocular lens; Phaco/IOL phacoemulsification with intraocular lens placement; [place] photorefractive keratectomy; LASIK laser assisted in situ keratomileusis; HTN hypertension; DM diabetes mellitus; COPD chronic obstructive pulmonary disease

## 2019-10-31 DIAGNOSIS — Z950 Presence of cardiac pacemaker: Secondary | ICD-10-CM | POA: Diagnosis not present

## 2019-11-01 DIAGNOSIS — N3000 Acute cystitis without hematuria: Secondary | ICD-10-CM | POA: Diagnosis not present

## 2019-11-01 DIAGNOSIS — N952 Postmenopausal atrophic vaginitis: Secondary | ICD-10-CM | POA: Diagnosis not present

## 2019-11-06 ENCOUNTER — Other Ambulatory Visit: Payer: Self-pay

## 2019-11-06 ENCOUNTER — Emergency Department (HOSPITAL_COMMUNITY): Payer: Medicare Other

## 2019-11-06 ENCOUNTER — Encounter (HOSPITAL_COMMUNITY): Payer: Self-pay

## 2019-11-06 ENCOUNTER — Emergency Department (HOSPITAL_COMMUNITY)
Admission: EM | Admit: 2019-11-06 | Discharge: 2019-11-06 | Disposition: A | Discharge status: Home / Self Care | Payer: Medicare Other | Attending: Emergency Medicine | Admitting: Emergency Medicine

## 2019-11-06 DIAGNOSIS — Z79899 Other long term (current) drug therapy: Secondary | ICD-10-CM | POA: Insufficient documentation

## 2019-11-06 DIAGNOSIS — R0602 Shortness of breath: Secondary | ICD-10-CM | POA: Diagnosis not present

## 2019-11-06 DIAGNOSIS — R059 Cough, unspecified: Secondary | ICD-10-CM

## 2019-11-06 DIAGNOSIS — E039 Hypothyroidism, unspecified: Secondary | ICD-10-CM | POA: Insufficient documentation

## 2019-11-06 DIAGNOSIS — I5032 Chronic diastolic (congestive) heart failure: Secondary | ICD-10-CM | POA: Diagnosis not present

## 2019-11-06 DIAGNOSIS — Z7901 Long term (current) use of anticoagulants: Secondary | ICD-10-CM | POA: Insufficient documentation

## 2019-11-06 DIAGNOSIS — R05 Cough: Secondary | ICD-10-CM | POA: Insufficient documentation

## 2019-11-06 DIAGNOSIS — I110 Hypertensive heart disease with heart failure: Secondary | ICD-10-CM | POA: Insufficient documentation

## 2019-11-06 DIAGNOSIS — Z20822 Contact with and (suspected) exposure to covid-19: Secondary | ICD-10-CM | POA: Insufficient documentation

## 2019-11-06 DIAGNOSIS — R9431 Abnormal electrocardiogram [ECG] [EKG]: Secondary | ICD-10-CM | POA: Diagnosis not present

## 2019-11-06 DIAGNOSIS — R531 Weakness: Secondary | ICD-10-CM | POA: Diagnosis not present

## 2019-11-06 LAB — CBC WITH DIFFERENTIAL/PLATELET
Abs Immature Granulocytes: 0.01 10*3/uL (ref 0.00–0.07)
Basophils Absolute: 0.1 10*3/uL (ref 0.0–0.1)
Basophils Relative: 1 %
Eosinophils Absolute: 0 10*3/uL (ref 0.0–0.5)
Eosinophils Relative: 0 %
HCT: 43.1 % (ref 36.0–46.0)
Hemoglobin: 13.7 g/dL (ref 12.0–15.0)
Immature Granulocytes: 0 %
Lymphocytes Relative: 23 %
Lymphs Abs: 1.1 10*3/uL (ref 0.7–4.0)
MCH: 29.8 pg (ref 26.0–34.0)
MCHC: 31.8 g/dL (ref 30.0–36.0)
MCV: 93.9 fL (ref 80.0–100.0)
Monocytes Absolute: 0.5 10*3/uL (ref 0.1–1.0)
Monocytes Relative: 10 %
Neutro Abs: 3.2 10*3/uL (ref 1.7–7.7)
Neutrophils Relative %: 66 %
Platelets: 166 10*3/uL (ref 150–400)
RBC: 4.59 MIL/uL (ref 3.87–5.11)
RDW: 14.4 % (ref 11.5–15.5)
WBC: 4.9 10*3/uL (ref 4.0–10.5)
nRBC: 0 % (ref 0.0–0.2)

## 2019-11-06 LAB — COMPREHENSIVE METABOLIC PANEL
ALT: 14 U/L (ref 0–44)
AST: 22 U/L (ref 15–41)
Albumin: 3.9 g/dL (ref 3.5–5.0)
Alkaline Phosphatase: 108 U/L (ref 38–126)
Anion gap: 13 (ref 5–15)
BUN: 12 mg/dL (ref 8–23)
CO2: 26 mmol/L (ref 22–32)
Calcium: 9.1 mg/dL (ref 8.9–10.3)
Chloride: 94 mmol/L — ABNORMAL LOW (ref 98–111)
Creatinine, Ser: 0.71 mg/dL (ref 0.44–1.00)
GFR calc Af Amer: >60 mL/min (ref >60)
GFR calc non Af Amer: >60 mL/min (ref >60)
Glucose, Bld: 100 mg/dL — ABNORMAL HIGH (ref 70–99)
Potassium: 4 mmol/L (ref 3.5–5.1)
Sodium: 133 mmol/L — ABNORMAL LOW (ref 135–145)
Total Bilirubin: 0.7 mg/dL (ref 0.3–1.2)
Total Protein: 6.4 g/dL — ABNORMAL LOW (ref 6.5–8.1)

## 2019-11-06 LAB — TROPONIN I (HIGH SENSITIVITY)
Troponin I (High Sensitivity): 6 ng/L (ref <18)
Troponin I (High Sensitivity): 6 ng/L (ref <18)

## 2019-11-06 LAB — BRAIN NATRIURETIC PEPTIDE: B Natriuretic Peptide: 272 pg/mL — ABNORMAL HIGH (ref 0.0–100.0)

## 2019-11-06 LAB — SARS CORONAVIRUS 2 BY RT PCR (HOSPITAL ORDER, PERFORMED IN ~~LOC~~ HOSPITAL LAB): SARS Coronavirus 2: NEGATIVE

## 2019-11-06 LAB — LACTIC ACID, PLASMA: Lactic Acid, Venous: 1.5 mmol/L (ref 0.5–1.9)

## 2019-11-06 MED ORDER — DOXYCYCLINE HYCLATE 100 MG PO CAPS
100.0000 mg | ORAL_CAPSULE | Freq: Two times a day (BID) | ORAL | 0 refills | Status: DC
Start: 2019-11-06 — End: 2020-02-08

## 2019-11-06 NOTE — Discharge Instructions (Addendum)
Take a whole demadex tablet tomorrow instead of a half.  Follow up with your md this week

## 2019-11-06 NOTE — ED Notes (Signed)
Pt under several blankets and a sweat shirt on.

## 2019-11-06 NOTE — ED Provider Notes (Signed)
[facility] Provider Note   CSN: [id number] Arrival date & time: 11/06/19  0945     History Chief Complaint  Patient presents with  . Cough  . Shortness of Breath    Cindy [name] is a 86 y.o. female.  Patient with a persistent productive cough.  No shortness of breath no fever.  The history is provided by the patient and medical records. No language interpreter was used.  Cough Cough characteristics:  Non-productive Sputum characteristics:  Nondescript Severity:  Moderate Onset quality:  Sudden Timing:  Constant Progression:  Worsening Chronicity:  New Smoker: no   Relieved by:  Nothing Worsened by:  Nothing Ineffective treatments:  None tried Associated symptoms: no chest pain, no eye discharge, no fever, no headaches and no rash   Shortness of Breath Associated symptoms: cough   Associated symptoms: no abdominal pain, no chest pain, no fever, no headaches and no rash        Past Medical History:  Diagnosis Date  . Cancer ([place])   . CHF (congestive heart failure) (HCC)    Amyloidosis  . Diarrhea   . Diverticulosis of colon (without mention of hemorrhage)   . Edema   . Esophagitis, unspecified   . Family history of malignant neoplasm of gastrointestinal tract   . Fibrosis of lung ([place]) 03/14/2013  . Flatulence, eructation, and gas pain   . GERD (gastroesophageal reflux disease)   . Left sided ulcerative colitis ([place])   . Maxillary sinus mass   . Osteoporosis, unspecified   . Other and unspecified hyperlipidemia   . Other and unspecified noninfectious gastroenteritis and colitis(558.9)   . Paroxysmal atrial fibrillation ([place]) 07/09/2019  . Stricture and stenosis of esophagus   . Unspecified hypothyroidism   . URI (upper respiratory infection)     Patient Active Problem List   Diagnosis Date Noted  . Exudative age-related macular degeneration of left eye with active choroidal neovascularization ([place]) 08/07/2019  . Cystoid  macular edema of left eye 08/07/2019  . Posterior vitreous detachment of left eye 08/07/2019  . Advanced nonexudative age-related macular degeneration of both eyes without subfoveal involvement 08/07/2019  . Light chain (AL) amyloidosis (HCC)   . Paroxysmal atrial fibrillation ([place]) 07/09/2019  . Chronic diastolic heart failure ([place])   . Pulmonary embolism ([place]) 07/08/2019  . Hypertension   . 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 Chest pain 10/25/2014  . Acute renal failure ([place]) 10/25/2014  . Hyponatremia 10/25/2014  . Hypotension 10/25/2014  . Acute on chronic diastolic heart failure ([place]) 10/25/2014  . Malnutrition of moderate degree ([place]) 10/25/2014  . Change in bowel habits 08/20/2014  . Cardiac failure ([place]) 06/26/2014  . ILD (interstitial lung disease) ([place]) 06/26/2014  . Upper airway cough syndrome 04/21/2014  . Dyspnea 04/17/2014  . Sinus tachycardia 06/26/2013  . Acute diastolic heart failure ([place]) 03/28/2013  . Bronchitis 03/14/2013  . Chest tightness 03/14/2013  . Interstitial pneumonitis ([place]) 03/14/2013  . Fibrosis of lung ([place]) 03/14/2013  . Family history of malignant neoplasm of gastrointestinal tract 05/06/2011  . STRICTURE AND STENOSIS OF ESOPHAGUS 05/27/2009  . OSTEOPOROSIS 10/16/2008  . EDEMA 10/16/2008  . Hypothyroidism 05/09/2007  . Hyperlipidemia 05/09/2007  . GERD (gastroesophageal reflux disease) 05/09/2007  . COLITIS 05/09/2007  . Left sided ulcerative colitis ([place]) 08/07/2005  . DIVERTICULOSIS, COLON 05/15/2004    Past Surgical History:  Procedure Laterality Date  . ABDOMINAL HYSTERECTOMY    . CATARACT EXTRACTION W/PHACO Bilateral    Dr. Gershon Crane  .  CHOLECYSTECTOMY    . FOOT SURGERY    . THYROIDECTOMY, PARTIAL    . YAG LASER APPLICATION Right 8/88/2800   Procedure: YAG LASER APPLICATION;  Surgeon: Elta Guadeloupe T. Gershon Crane, MD;  Location: [facility];  Service: Ophthalmology;  Laterality: Right;     OB History    Gravida  3   Para  2   Term  2    Preterm      AB  1   Living        SAB  1   TAB      Ectopic      Multiple      Live Births              Family History  Problem Relation Age of Onset  . Colon cancer Mother   . Heart attack Father        CHF  . Stomach cancer Sister   . Diabetes Son   . Kidney cancer Son   . Adrenal disorder Son   . Heart disease Brother     Social History   Tobacco Use  . Smoking status: Never Smoker  . Smokeless tobacco: Never Used  Substance Use Topics  . Alcohol use: No    Alcohol/week: 0.0 standard drinks  . Drug use: No    Home Medications Prior to Admission medications   Medication Sig Start Date End Date Taking? Authorizing Provider  acetaminophen (TYLENOL) 500 MG tablet Take 1,000 mg by mouth as needed.   Yes [provider]  albuterol (PROVENTIL) (2.5 MG/3ML) 0.083% nebulizer solution Take 3 mLs by nebulization 4 (four) times daily as needed. 10/18/19  Yes [provider]  ALPRAZolam Duanne Moron) 0.5 MG tablet Take 1 tablet (0.5 mg total) by mouth at bedtime as needed for anxiety. 03/15/13  Yes Robbie Lis, MD  apixaban (ELIQUIS) 5 MG TABS tablet Take 2 tablets by mouth twice a day for 7 days; and after that 1 tablet twice a day on daily basis. 07/10/19  Yes Barton Dubois, MD  calcium carbonate (TUMS - DOSED IN MG ELEMENTAL CALCIUM) 500 MG chewable tablet Chew 1 tablet by mouth daily.   Yes [provider]  cholecalciferol (VITAMIN D) 1000 UNITS tablet Take 1,000 Units by mouth daily.     Yes [provider]  CYANOCOBALAMIN PO Take 1 tablet by mouth daily.   Yes [provider]  dextromethorphan 15 MG/5ML syrup Take 5 mLs by mouth 2 (two) times daily.   Yes [provider]  fentaNYL (DURAGESIC) 25 MCG/HR Place 1 patch onto the skin every 3 (three) days. 06/15/19  Yes [provider]  guaiFENesin (MUCINEX) 600 MG 12 hr tablet Take 1,200 mg by mouth 2 (two) times daily.   Yes [provider]    guaiFENesin (ROBITUSSIN) 100 MG/5ML liquid Take 5 mLs by mouth 2 (two) times daily.   Yes [provider]  levothyroxine (SYNTHROID) 100 MCG tablet Take 100 mcg by mouth every morning. 05/30/19  Yes [provider]  metoprolol succinate (TOPROL-XL) 25 MG 24 hr tablet Take 0.5-1 tablets (12.5-25 mg total) by mouth in the morning and at bedtime. Patient takes 1 tablet (72m) in the morning and 1/2 of a tablet (12.586m in the evening 07/10/19  Yes MaBarton DuboisMD  metoprolol tartrate (LOPRESSOR) 25 MG tablet Take 25 mg by mouth.   Yes [provider]  omeprazole (PRILOSEC) 20 MG capsule Take 20 mg by mouth daily.   Yes [provider]  ondansetron (ZOFRAN) 4 MG tablet Take 4 mg by mouth every 8 (eight) hours as needed. 08/03/19  Yes [provider]  Polyethyl Glycol-Propyl Glycol (SYSTANE OP) Apply 1 drop to eye daily.   Yes [provider]  Potassium Chloride ER 20 MEQ TBCR Take 1 tablet by mouth daily. 04/12/19  Yes [provider]  senna (SENOKOT) 8.6 MG tablet Take 2 tablets by mouth as needed for constipation.   Yes [provider]  sodium chloride (OCEAN) 0.65 % nasal spray Place 2 sprays into the nose daily as needed.    Yes [provider]  torsemide (DEMADEX) 20 MG tablet Take 20 mg by mouth daily. 03/28/19  Yes [provider]  doxycycline (VIBRAMYCIN) 100 MG capsule Take 1 capsule (100 mg total) by mouth 2 (two) times daily. One po bid x 7 days 11/06/19   Milton Ferguson, MD  esomeprazole (NEXIUM) 40 MG capsule Take 40 mg by mouth daily as needed.  Patient not taking: Reported on 11/06/2019 05/25/19   [provider]  Multiple Vitamin (MULTIVITAMIN ADULT PO) Take by mouth. Patient not taking: Reported on 11/06/2019    [provider]  Multiple Vitamins-Minerals (WOMENS HAIR, SKIN & NAILS PO) Take 1 tablet by mouth daily. Patient not taking: Reported on 11/06/2019    [provider]     Allergies    Nebivolol hcl, Codeine, Sulfa antibiotics, and Sulfonamide derivatives  Review of Systems   Review of Systems  Constitutional: Negative for appetite change, fatigue and fever.  HENT: Negative for congestion, ear discharge and sinus pressure.   Eyes: Negative for discharge.  Respiratory: Positive for cough.   Cardiovascular: Negative for chest pain.  Gastrointestinal: Negative for abdominal pain and diarrhea.  Genitourinary: Negative for frequency and hematuria.  Musculoskeletal: Negative for back pain.  Skin: Negative for rash.  Neurological: Negative for seizures and headaches.  Psychiatric/Behavioral: Negative for hallucinations.    Physical Exam Updated Vital Signs BP 127/71   Pulse 78   Temp 98.5 F (36.9 C) (Oral)   Resp (!) 21   Ht 5' 8"  (1.727 m)   Wt 55.3 kg   SpO2 93%   BMI 18.55 kg/m   Physical Exam Vitals and nursing note reviewed.  Constitutional:      Appearance: She is well-developed.  HENT:     Head: Normocephalic.     Nose: Nose normal.  Eyes:     General: No scleral icterus.    Conjunctiva/sclera: Conjunctivae normal.  Neck:     Thyroid: No thyromegaly.  Cardiovascular:     Rate and Rhythm: Normal rate and regular rhythm.     Heart sounds: No murmur heard.  No friction rub. No gallop.   Pulmonary:     Breath sounds: No stridor. No wheezing or rales.  Chest:     Chest wall: No tenderness.  Abdominal:     General: There is no distension.     Tenderness: There is no abdominal tenderness. There is no rebound.  Musculoskeletal:        General: Normal range of motion.     Cervical back: Neck supple.  Lymphadenopathy:     Cervical: No cervical adenopathy.  Skin:    Findings: No erythema or rash.  Neurological:     Mental Status: She is alert and oriented to person, place, and time.     Motor: No abnormal muscle tone.     Coordination: Coordination normal.  Psychiatric:        Behavior:  Behavior normal.     ED Results /  Procedures / Treatments   Labs (all labs ordered are listed, but only abnormal results are displayed) Labs Reviewed  COMPREHENSIVE METABOLIC PANEL - Abnormal; Notable for the following components:      Result Value   Sodium 133 (*)    Chloride 94 (*)    Glucose, Bld 100 (*)    Total Protein 6.4 (*)    All other components within normal limits  BRAIN NATRIURETIC PEPTIDE - Abnormal; Notable for the following components:   B Natriuretic Peptide 272.0 (*)    All other components within normal limits  SARS CORONAVIRUS 2 BY RT PCR (HOSPITAL ORDER, [facility])  CBC WITH DIFFERENTIAL/PLATELET  LACTIC ACID, PLASMA  TROPONIN I (HIGH SENSITIVITY)  TROPONIN I (HIGH SENSITIVITY)    EKG None  Radiology DG Chest Portable 1 View  Result Date: 11/06/2019 CLINICAL DATA:  Sob with cough and weakness EXAM: PORTABLE CHEST 1 VIEW COMPARISON:  Chest radiograph 09/27/2019 FINDINGS: The heart size and mediastinal contours are within normal limits. Aortic atherosclerotic calcification. Unchanged diffuse bilateral coarse interstitial opacities. No new focal consolidation. Trace right pleural effusion versus thickening. No pneumothorax or significant pleural effusion. The visualized skeletal structures are unremarkable. IMPRESSION: Unchanged diffuse bilateral coarse interstitial opacities. No new focal consolidation. Electronically Signed   By: Audie Pinto M.D.   On: 11/06/2019 13:09    Procedures Procedures (including critical care time)  Medications Ordered in ED Medications - No data to display  ED Course  I have reviewed the triage vital signs and the nursing notes.  Pertinent labs & imaging results that were available during my care of the patient were reviewed by me and considered in my medical decision making (see chart for details).    MDM Rules/Calculators/A&P                          Patient with a mild cough.  Her BNP is slightly elevated.  She will take  an extra Demadex tablet today and is started on some doxycycline and will follow up with her PCP        This patient presents to the ED for concern of cough, this involves an extensive number of treatment options, and is a complaint that carries with it a high risk of complications and morbidity.  The differential diagnosis includes pneumonia PE heart failure   Lab Tests:   I Ordered, reviewed, and interpreted labs, which included CBC chemistries BNP.  CBC and chemistries unremarkable.  BNP elevated 272  Medicines ordered:   I ordered medication doxycycline for bronchitis  Imaging Studies ordered:   I ordered imaging studies which included chest x-ray and  I independently visualized and interpreted imaging which showed no acute disease  Additional history obtained:   Additional history obtained from records  Previous records obtained and reviewed.  Consultations Obtained:     Reevaluation:  After the interventions stated above, I reevaluated the patient and found mild improvement  Critical Interventions:  .   Final Clinical Impression(s) / ED Diagnoses Final diagnoses:  None    Rx / DC Orders ED Discharge Orders         Ordered    doxycycline (VIBRAMYCIN) 100 MG capsule  2 times daily     Discontinue  Reprint     11/06/19 1444           Milton Ferguson, MD 11/08/19 [phone]

## 2019-11-06 NOTE — ED Triage Notes (Signed)
Pt reports she became congested with cough on Thursday and was unable to be seen Friday by PCP. Pt has been taking mucinex and delsym. Pt reports SOB esp with exertion

## 2019-11-10 DIAGNOSIS — I43 Cardiomyopathy in diseases classified elsewhere: Secondary | ICD-10-CM | POA: Diagnosis not present

## 2019-11-10 DIAGNOSIS — I2782 Chronic pulmonary embolism: Secondary | ICD-10-CM | POA: Diagnosis not present

## 2019-11-10 DIAGNOSIS — J690 Pneumonitis due to inhalation of food and vomit: Secondary | ICD-10-CM | POA: Diagnosis not present

## 2019-11-10 DIAGNOSIS — J90 Pleural effusion, not elsewhere classified: Secondary | ICD-10-CM | POA: Diagnosis not present

## 2019-11-10 DIAGNOSIS — I2693 Single subsegmental pulmonary embolism without acute cor pulmonale: Secondary | ICD-10-CM | POA: Diagnosis not present

## 2019-11-10 DIAGNOSIS — J810 Acute pulmonary edema: Secondary | ICD-10-CM | POA: Diagnosis not present

## 2019-11-10 DIAGNOSIS — I480 Paroxysmal atrial fibrillation: Secondary | ICD-10-CM | POA: Diagnosis not present

## 2019-11-10 DIAGNOSIS — Z9221 Personal history of antineoplastic chemotherapy: Secondary | ICD-10-CM | POA: Diagnosis not present

## 2019-11-10 DIAGNOSIS — I5032 Chronic diastolic (congestive) heart failure: Secondary | ICD-10-CM | POA: Diagnosis not present

## 2019-11-10 DIAGNOSIS — J9621 Acute and chronic respiratory failure with hypoxia: Secondary | ICD-10-CM | POA: Diagnosis not present

## 2019-11-10 DIAGNOSIS — R918 Other nonspecific abnormal finding of lung field: Secondary | ICD-10-CM | POA: Diagnosis not present

## 2019-11-10 DIAGNOSIS — E871 Hypo-osmolality and hyponatremia: Secondary | ICD-10-CM | POA: Diagnosis not present

## 2019-11-11 DIAGNOSIS — I2693 Single subsegmental pulmonary embolism without acute cor pulmonale: Secondary | ICD-10-CM | POA: Diagnosis not present

## 2019-11-11 DIAGNOSIS — E871 Hypo-osmolality and hyponatremia: Secondary | ICD-10-CM | POA: Diagnosis present

## 2019-11-11 DIAGNOSIS — R0902 Hypoxemia: Secondary | ICD-10-CM | POA: Diagnosis not present

## 2019-11-11 DIAGNOSIS — Z7901 Long term (current) use of anticoagulants: Secondary | ICD-10-CM | POA: Diagnosis not present

## 2019-11-11 DIAGNOSIS — E854 Organ-limited amyloidosis: Secondary | ICD-10-CM | POA: Diagnosis not present

## 2019-11-11 DIAGNOSIS — Z20822 Contact with and (suspected) exposure to covid-19: Secondary | ICD-10-CM | POA: Diagnosis present

## 2019-11-11 DIAGNOSIS — K5190 Ulcerative colitis, unspecified, without complications: Secondary | ICD-10-CM | POA: Diagnosis present

## 2019-11-11 DIAGNOSIS — J849 Interstitial pulmonary disease, unspecified: Secondary | ICD-10-CM | POA: Diagnosis present

## 2019-11-11 DIAGNOSIS — H3530 Unspecified macular degeneration: Secondary | ICD-10-CM | POA: Diagnosis present

## 2019-11-11 DIAGNOSIS — I43 Cardiomyopathy in diseases classified elsewhere: Secondary | ICD-10-CM | POA: Diagnosis not present

## 2019-11-11 DIAGNOSIS — R1312 Dysphagia, oropharyngeal phase: Secondary | ICD-10-CM | POA: Diagnosis not present

## 2019-11-11 DIAGNOSIS — I2782 Chronic pulmonary embolism: Secondary | ICD-10-CM | POA: Diagnosis present

## 2019-11-11 DIAGNOSIS — J90 Pleural effusion, not elsewhere classified: Secondary | ICD-10-CM | POA: Diagnosis not present

## 2019-11-11 DIAGNOSIS — J9621 Acute and chronic respiratory failure with hypoxia: Secondary | ICD-10-CM | POA: Diagnosis present

## 2019-11-11 DIAGNOSIS — I480 Paroxysmal atrial fibrillation: Secondary | ICD-10-CM | POA: Diagnosis not present

## 2019-11-11 DIAGNOSIS — F419 Anxiety disorder, unspecified: Secondary | ICD-10-CM | POA: Diagnosis present

## 2019-11-11 DIAGNOSIS — Z66 Do not resuscitate: Secondary | ICD-10-CM | POA: Diagnosis present

## 2019-11-11 DIAGNOSIS — Z79899 Other long term (current) drug therapy: Secondary | ICD-10-CM | POA: Diagnosis not present

## 2019-11-11 DIAGNOSIS — I5032 Chronic diastolic (congestive) heart failure: Secondary | ICD-10-CM | POA: Diagnosis present

## 2019-11-11 DIAGNOSIS — I4891 Unspecified atrial fibrillation: Secondary | ICD-10-CM | POA: Diagnosis present

## 2019-11-11 DIAGNOSIS — Z86718 Personal history of other venous thrombosis and embolism: Secondary | ICD-10-CM | POA: Diagnosis not present

## 2019-11-11 DIAGNOSIS — E039 Hypothyroidism, unspecified: Secondary | ICD-10-CM | POA: Diagnosis present

## 2019-11-11 DIAGNOSIS — R918 Other nonspecific abnormal finding of lung field: Secondary | ICD-10-CM | POA: Diagnosis not present

## 2019-11-11 DIAGNOSIS — Z950 Presence of cardiac pacemaker: Secondary | ICD-10-CM | POA: Diagnosis not present

## 2019-11-11 DIAGNOSIS — K219 Gastro-esophageal reflux disease without esophagitis: Secondary | ICD-10-CM | POA: Diagnosis present

## 2019-11-11 DIAGNOSIS — J690 Pneumonitis due to inhalation of food and vomit: Secondary | ICD-10-CM | POA: Diagnosis not present

## 2019-11-11 DIAGNOSIS — R791 Abnormal coagulation profile: Secondary | ICD-10-CM | POA: Diagnosis present

## 2019-11-11 DIAGNOSIS — G4700 Insomnia, unspecified: Secondary | ICD-10-CM | POA: Diagnosis present

## 2019-11-16 DIAGNOSIS — I5033 Acute on chronic diastolic (congestive) heart failure: Secondary | ICD-10-CM | POA: Diagnosis not present

## 2019-11-16 DIAGNOSIS — G894 Chronic pain syndrome: Secondary | ICD-10-CM | POA: Diagnosis not present

## 2019-11-16 DIAGNOSIS — I509 Heart failure, unspecified: Secondary | ICD-10-CM | POA: Diagnosis not present

## 2019-11-16 DIAGNOSIS — Z86718 Personal history of other venous thrombosis and embolism: Secondary | ICD-10-CM | POA: Diagnosis not present

## 2019-11-16 DIAGNOSIS — E8581 Light chain (AL) amyloidosis: Secondary | ICD-10-CM | POA: Diagnosis not present

## 2019-11-16 DIAGNOSIS — Z950 Presence of cardiac pacemaker: Secondary | ICD-10-CM | POA: Diagnosis not present

## 2019-11-16 DIAGNOSIS — J99 Respiratory disorders in diseases classified elsewhere: Secondary | ICD-10-CM | POA: Diagnosis not present

## 2019-11-16 DIAGNOSIS — Z0001 Encounter for general adult medical examination with abnormal findings: Secondary | ICD-10-CM | POA: Diagnosis not present

## 2019-11-16 DIAGNOSIS — H3530 Unspecified macular degeneration: Secondary | ICD-10-CM | POA: Diagnosis not present

## 2019-11-16 DIAGNOSIS — I43 Cardiomyopathy in diseases classified elsewhere: Secondary | ICD-10-CM | POA: Diagnosis not present

## 2019-11-16 DIAGNOSIS — G4700 Insomnia, unspecified: Secondary | ICD-10-CM | POA: Diagnosis not present

## 2019-11-16 DIAGNOSIS — I2782 Chronic pulmonary embolism: Secondary | ICD-10-CM | POA: Diagnosis not present

## 2019-11-16 DIAGNOSIS — J690 Pneumonitis due to inhalation of food and vomit: Secondary | ICD-10-CM | POA: Diagnosis not present

## 2019-11-16 DIAGNOSIS — I340 Nonrheumatic mitral (valve) insufficiency: Secondary | ICD-10-CM | POA: Diagnosis not present

## 2019-11-16 DIAGNOSIS — J849 Interstitial pulmonary disease, unspecified: Secondary | ICD-10-CM | POA: Diagnosis not present

## 2019-11-16 DIAGNOSIS — J9621 Acute and chronic respiratory failure with hypoxia: Secondary | ICD-10-CM | POA: Diagnosis not present

## 2019-11-16 DIAGNOSIS — K5190 Ulcerative colitis, unspecified, without complications: Secondary | ICD-10-CM | POA: Diagnosis not present

## 2019-11-16 DIAGNOSIS — F419 Anxiety disorder, unspecified: Secondary | ICD-10-CM | POA: Diagnosis not present

## 2019-11-16 DIAGNOSIS — K219 Gastro-esophageal reflux disease without esophagitis: Secondary | ICD-10-CM | POA: Diagnosis not present

## 2019-11-16 DIAGNOSIS — I4891 Unspecified atrial fibrillation: Secondary | ICD-10-CM | POA: Diagnosis not present

## 2019-11-16 DIAGNOSIS — Z7901 Long term (current) use of anticoagulants: Secondary | ICD-10-CM | POA: Diagnosis not present

## 2019-11-16 DIAGNOSIS — E039 Hypothyroidism, unspecified: Secondary | ICD-10-CM | POA: Diagnosis not present

## 2019-11-20 DIAGNOSIS — J690 Pneumonitis due to inhalation of food and vomit: Secondary | ICD-10-CM | POA: Diagnosis not present

## 2019-11-20 DIAGNOSIS — I5033 Acute on chronic diastolic (congestive) heart failure: Secondary | ICD-10-CM | POA: Diagnosis not present

## 2019-11-20 DIAGNOSIS — J849 Interstitial pulmonary disease, unspecified: Secondary | ICD-10-CM | POA: Diagnosis not present

## 2019-11-20 DIAGNOSIS — J9621 Acute and chronic respiratory failure with hypoxia: Secondary | ICD-10-CM | POA: Diagnosis not present

## 2019-11-20 DIAGNOSIS — E8581 Light chain (AL) amyloidosis: Secondary | ICD-10-CM | POA: Diagnosis not present

## 2019-11-20 DIAGNOSIS — I2782 Chronic pulmonary embolism: Secondary | ICD-10-CM | POA: Diagnosis not present

## 2019-11-21 DIAGNOSIS — E039 Hypothyroidism, unspecified: Secondary | ICD-10-CM | POA: Diagnosis not present

## 2019-11-21 DIAGNOSIS — G894 Chronic pain syndrome: Secondary | ICD-10-CM | POA: Diagnosis not present

## 2019-11-21 DIAGNOSIS — I5032 Chronic diastolic (congestive) heart failure: Secondary | ICD-10-CM | POA: Diagnosis not present

## 2019-11-21 DIAGNOSIS — J8417 Interstitial lung disease with progressive fibrotic phenotype in diseases classified elsewhere: Secondary | ICD-10-CM | POA: Diagnosis not present

## 2019-11-21 DIAGNOSIS — R0902 Hypoxemia: Secondary | ICD-10-CM | POA: Diagnosis not present

## 2019-11-21 DIAGNOSIS — I509 Heart failure, unspecified: Secondary | ICD-10-CM | POA: Diagnosis not present

## 2019-11-21 DIAGNOSIS — E8581 Light chain (AL) amyloidosis: Secondary | ICD-10-CM | POA: Diagnosis not present

## 2019-11-21 DIAGNOSIS — I480 Paroxysmal atrial fibrillation: Secondary | ICD-10-CM | POA: Diagnosis not present

## 2019-11-21 DIAGNOSIS — R05 Cough: Secondary | ICD-10-CM | POA: Diagnosis not present

## 2019-11-21 DIAGNOSIS — J690 Pneumonitis due to inhalation of food and vomit: Secondary | ICD-10-CM | POA: Diagnosis not present

## 2019-11-21 DIAGNOSIS — R0600 Dyspnea, unspecified: Secondary | ICD-10-CM | POA: Diagnosis not present

## 2019-11-21 DIAGNOSIS — I483 Typical atrial flutter: Secondary | ICD-10-CM | POA: Diagnosis not present

## 2019-11-22 DIAGNOSIS — I509 Heart failure, unspecified: Secondary | ICD-10-CM | POA: Diagnosis not present

## 2019-11-22 DIAGNOSIS — E8581 Light chain (AL) amyloidosis: Secondary | ICD-10-CM | POA: Diagnosis not present

## 2019-11-22 DIAGNOSIS — I5032 Chronic diastolic (congestive) heart failure: Secondary | ICD-10-CM | POA: Diagnosis not present

## 2019-11-22 DIAGNOSIS — I43 Cardiomyopathy in diseases classified elsewhere: Secondary | ICD-10-CM | POA: Diagnosis not present

## 2019-11-22 DIAGNOSIS — J690 Pneumonitis due to inhalation of food and vomit: Secondary | ICD-10-CM | POA: Diagnosis not present

## 2019-11-22 DIAGNOSIS — Z79899 Other long term (current) drug therapy: Secondary | ICD-10-CM | POA: Diagnosis not present

## 2019-11-22 DIAGNOSIS — E854 Organ-limited amyloidosis: Secondary | ICD-10-CM | POA: Diagnosis not present

## 2019-11-22 DIAGNOSIS — H3530 Unspecified macular degeneration: Secondary | ICD-10-CM | POA: Diagnosis not present

## 2019-11-22 DIAGNOSIS — D61811 Other drug-induced pancytopenia: Secondary | ICD-10-CM | POA: Diagnosis not present

## 2019-11-22 DIAGNOSIS — T387X5D Adverse effect of androgens and anabolic congeners, subsequent encounter: Secondary | ICD-10-CM | POA: Diagnosis not present

## 2019-11-22 DIAGNOSIS — I2699 Other pulmonary embolism without acute cor pulmonale: Secondary | ICD-10-CM | POA: Diagnosis not present

## 2019-11-22 DIAGNOSIS — Z7901 Long term (current) use of anticoagulants: Secondary | ICD-10-CM | POA: Diagnosis not present

## 2019-11-22 DIAGNOSIS — J849 Interstitial pulmonary disease, unspecified: Secondary | ICD-10-CM | POA: Diagnosis not present

## 2019-11-22 DIAGNOSIS — I4891 Unspecified atrial fibrillation: Secondary | ICD-10-CM | POA: Diagnosis not present

## 2019-11-23 DIAGNOSIS — J849 Interstitial pulmonary disease, unspecified: Secondary | ICD-10-CM | POA: Diagnosis not present

## 2019-11-23 DIAGNOSIS — E8581 Light chain (AL) amyloidosis: Secondary | ICD-10-CM | POA: Diagnosis not present

## 2019-11-23 DIAGNOSIS — J9621 Acute and chronic respiratory failure with hypoxia: Secondary | ICD-10-CM | POA: Diagnosis not present

## 2019-11-23 DIAGNOSIS — I2782 Chronic pulmonary embolism: Secondary | ICD-10-CM | POA: Diagnosis not present

## 2019-11-23 DIAGNOSIS — I5033 Acute on chronic diastolic (congestive) heart failure: Secondary | ICD-10-CM | POA: Diagnosis not present

## 2019-11-23 DIAGNOSIS — J690 Pneumonitis due to inhalation of food and vomit: Secondary | ICD-10-CM | POA: Diagnosis not present

## 2019-11-25 DIAGNOSIS — R1312 Dysphagia, oropharyngeal phase: Secondary | ICD-10-CM | POA: Insufficient documentation

## 2019-11-25 DIAGNOSIS — E8581 Light chain (AL) amyloidosis: Secondary | ICD-10-CM | POA: Diagnosis not present

## 2019-11-25 DIAGNOSIS — J690 Pneumonitis due to inhalation of food and vomit: Secondary | ICD-10-CM | POA: Diagnosis not present

## 2019-11-25 DIAGNOSIS — I5033 Acute on chronic diastolic (congestive) heart failure: Secondary | ICD-10-CM | POA: Diagnosis not present

## 2019-11-25 DIAGNOSIS — J9621 Acute and chronic respiratory failure with hypoxia: Secondary | ICD-10-CM | POA: Diagnosis not present

## 2019-11-25 DIAGNOSIS — J849 Interstitial pulmonary disease, unspecified: Secondary | ICD-10-CM | POA: Diagnosis not present

## 2019-11-25 DIAGNOSIS — I2782 Chronic pulmonary embolism: Secondary | ICD-10-CM | POA: Diagnosis not present

## 2019-11-28 DIAGNOSIS — I2782 Chronic pulmonary embolism: Secondary | ICD-10-CM | POA: Diagnosis not present

## 2019-11-28 DIAGNOSIS — J9621 Acute and chronic respiratory failure with hypoxia: Secondary | ICD-10-CM | POA: Diagnosis not present

## 2019-11-28 DIAGNOSIS — I5033 Acute on chronic diastolic (congestive) heart failure: Secondary | ICD-10-CM | POA: Diagnosis not present

## 2019-11-28 DIAGNOSIS — E8581 Light chain (AL) amyloidosis: Secondary | ICD-10-CM | POA: Diagnosis not present

## 2019-11-28 DIAGNOSIS — J690 Pneumonitis due to inhalation of food and vomit: Secondary | ICD-10-CM | POA: Diagnosis not present

## 2019-11-28 DIAGNOSIS — J849 Interstitial pulmonary disease, unspecified: Secondary | ICD-10-CM | POA: Diagnosis not present

## 2019-11-30 DIAGNOSIS — I43 Cardiomyopathy in diseases classified elsewhere: Secondary | ICD-10-CM | POA: Diagnosis not present

## 2019-11-30 DIAGNOSIS — I5030 Unspecified diastolic (congestive) heart failure: Secondary | ICD-10-CM | POA: Diagnosis not present

## 2019-11-30 DIAGNOSIS — I480 Paroxysmal atrial fibrillation: Secondary | ICD-10-CM | POA: Diagnosis not present

## 2019-11-30 DIAGNOSIS — J849 Interstitial pulmonary disease, unspecified: Secondary | ICD-10-CM | POA: Diagnosis not present

## 2019-11-30 DIAGNOSIS — E854 Organ-limited amyloidosis: Secondary | ICD-10-CM | POA: Diagnosis not present

## 2019-11-30 DIAGNOSIS — Z9229 Personal history of other drug therapy: Secondary | ICD-10-CM | POA: Diagnosis not present

## 2019-12-01 DIAGNOSIS — I5033 Acute on chronic diastolic (congestive) heart failure: Secondary | ICD-10-CM | POA: Diagnosis not present

## 2019-12-01 DIAGNOSIS — E8581 Light chain (AL) amyloidosis: Secondary | ICD-10-CM | POA: Diagnosis not present

## 2019-12-01 DIAGNOSIS — I2782 Chronic pulmonary embolism: Secondary | ICD-10-CM | POA: Diagnosis not present

## 2019-12-01 DIAGNOSIS — J9621 Acute and chronic respiratory failure with hypoxia: Secondary | ICD-10-CM | POA: Diagnosis not present

## 2019-12-01 DIAGNOSIS — J849 Interstitial pulmonary disease, unspecified: Secondary | ICD-10-CM | POA: Diagnosis not present

## 2019-12-01 DIAGNOSIS — J690 Pneumonitis due to inhalation of food and vomit: Secondary | ICD-10-CM | POA: Diagnosis not present

## 2019-12-07 DIAGNOSIS — J849 Interstitial pulmonary disease, unspecified: Secondary | ICD-10-CM | POA: Diagnosis not present

## 2019-12-07 DIAGNOSIS — I2782 Chronic pulmonary embolism: Secondary | ICD-10-CM | POA: Diagnosis not present

## 2019-12-07 DIAGNOSIS — I5033 Acute on chronic diastolic (congestive) heart failure: Secondary | ICD-10-CM | POA: Diagnosis not present

## 2019-12-07 DIAGNOSIS — J9621 Acute and chronic respiratory failure with hypoxia: Secondary | ICD-10-CM | POA: Diagnosis not present

## 2019-12-07 DIAGNOSIS — E8581 Light chain (AL) amyloidosis: Secondary | ICD-10-CM | POA: Diagnosis not present

## 2019-12-07 DIAGNOSIS — J690 Pneumonitis due to inhalation of food and vomit: Secondary | ICD-10-CM | POA: Diagnosis not present

## 2019-12-08 DIAGNOSIS — I480 Paroxysmal atrial fibrillation: Secondary | ICD-10-CM | POA: Diagnosis not present

## 2019-12-08 DIAGNOSIS — I509 Heart failure, unspecified: Secondary | ICD-10-CM | POA: Diagnosis not present

## 2019-12-08 DIAGNOSIS — E039 Hypothyroidism, unspecified: Secondary | ICD-10-CM | POA: Diagnosis not present

## 2019-12-08 DIAGNOSIS — E859 Amyloidosis, unspecified: Secondary | ICD-10-CM | POA: Diagnosis not present

## 2019-12-08 DIAGNOSIS — Z0189 Encounter for other specified special examinations: Secondary | ICD-10-CM | POA: Diagnosis not present

## 2019-12-08 DIAGNOSIS — R0902 Hypoxemia: Secondary | ICD-10-CM | POA: Diagnosis not present

## 2019-12-08 DIAGNOSIS — I5032 Chronic diastolic (congestive) heart failure: Secondary | ICD-10-CM | POA: Diagnosis not present

## 2019-12-08 DIAGNOSIS — N390 Urinary tract infection, site not specified: Secondary | ICD-10-CM | POA: Diagnosis not present

## 2019-12-08 DIAGNOSIS — J690 Pneumonitis due to inhalation of food and vomit: Secondary | ICD-10-CM | POA: Diagnosis not present

## 2019-12-08 DIAGNOSIS — G894 Chronic pain syndrome: Secondary | ICD-10-CM | POA: Diagnosis not present

## 2019-12-08 DIAGNOSIS — R21 Rash and other nonspecific skin eruption: Secondary | ICD-10-CM | POA: Diagnosis not present

## 2019-12-08 DIAGNOSIS — E8581 Light chain (AL) amyloidosis: Secondary | ICD-10-CM | POA: Diagnosis not present

## 2019-12-14 DIAGNOSIS — J849 Interstitial pulmonary disease, unspecified: Secondary | ICD-10-CM | POA: Diagnosis not present

## 2019-12-14 DIAGNOSIS — J9621 Acute and chronic respiratory failure with hypoxia: Secondary | ICD-10-CM | POA: Diagnosis not present

## 2019-12-14 DIAGNOSIS — I5033 Acute on chronic diastolic (congestive) heart failure: Secondary | ICD-10-CM | POA: Diagnosis not present

## 2019-12-14 DIAGNOSIS — J690 Pneumonitis due to inhalation of food and vomit: Secondary | ICD-10-CM | POA: Diagnosis not present

## 2019-12-14 DIAGNOSIS — E8581 Light chain (AL) amyloidosis: Secondary | ICD-10-CM | POA: Diagnosis not present

## 2019-12-14 DIAGNOSIS — I2782 Chronic pulmonary embolism: Secondary | ICD-10-CM | POA: Diagnosis not present

## 2019-12-16 DIAGNOSIS — I2782 Chronic pulmonary embolism: Secondary | ICD-10-CM | POA: Diagnosis not present

## 2019-12-16 DIAGNOSIS — J690 Pneumonitis due to inhalation of food and vomit: Secondary | ICD-10-CM | POA: Diagnosis not present

## 2019-12-16 DIAGNOSIS — J99 Respiratory disorders in diseases classified elsewhere: Secondary | ICD-10-CM | POA: Diagnosis not present

## 2019-12-16 DIAGNOSIS — K219 Gastro-esophageal reflux disease without esophagitis: Secondary | ICD-10-CM | POA: Diagnosis not present

## 2019-12-16 DIAGNOSIS — F419 Anxiety disorder, unspecified: Secondary | ICD-10-CM | POA: Diagnosis not present

## 2019-12-16 DIAGNOSIS — Z86718 Personal history of other venous thrombosis and embolism: Secondary | ICD-10-CM | POA: Diagnosis not present

## 2019-12-16 DIAGNOSIS — Z7901 Long term (current) use of anticoagulants: Secondary | ICD-10-CM | POA: Diagnosis not present

## 2019-12-16 DIAGNOSIS — I5033 Acute on chronic diastolic (congestive) heart failure: Secondary | ICD-10-CM | POA: Diagnosis not present

## 2019-12-16 DIAGNOSIS — I43 Cardiomyopathy in diseases classified elsewhere: Secondary | ICD-10-CM | POA: Diagnosis not present

## 2019-12-16 DIAGNOSIS — K5190 Ulcerative colitis, unspecified, without complications: Secondary | ICD-10-CM | POA: Diagnosis not present

## 2019-12-16 DIAGNOSIS — J849 Interstitial pulmonary disease, unspecified: Secondary | ICD-10-CM | POA: Diagnosis not present

## 2019-12-16 DIAGNOSIS — I340 Nonrheumatic mitral (valve) insufficiency: Secondary | ICD-10-CM | POA: Diagnosis not present

## 2019-12-16 DIAGNOSIS — E039 Hypothyroidism, unspecified: Secondary | ICD-10-CM | POA: Diagnosis not present

## 2019-12-16 DIAGNOSIS — E8581 Light chain (AL) amyloidosis: Secondary | ICD-10-CM | POA: Diagnosis not present

## 2019-12-16 DIAGNOSIS — G4700 Insomnia, unspecified: Secondary | ICD-10-CM | POA: Diagnosis not present

## 2019-12-16 DIAGNOSIS — Z950 Presence of cardiac pacemaker: Secondary | ICD-10-CM | POA: Diagnosis not present

## 2019-12-16 DIAGNOSIS — H3530 Unspecified macular degeneration: Secondary | ICD-10-CM | POA: Diagnosis not present

## 2019-12-16 DIAGNOSIS — J9621 Acute and chronic respiratory failure with hypoxia: Secondary | ICD-10-CM | POA: Diagnosis not present

## 2019-12-16 DIAGNOSIS — I4891 Unspecified atrial fibrillation: Secondary | ICD-10-CM | POA: Diagnosis not present

## 2019-12-18 DIAGNOSIS — J704 Drug-induced interstitial lung disorders, unspecified: Secondary | ICD-10-CM | POA: Diagnosis not present

## 2019-12-18 DIAGNOSIS — E859 Amyloidosis, unspecified: Secondary | ICD-10-CM | POA: Diagnosis not present

## 2019-12-18 DIAGNOSIS — E785 Hyperlipidemia, unspecified: Secondary | ICD-10-CM | POA: Diagnosis not present

## 2019-12-18 DIAGNOSIS — R945 Abnormal results of liver function studies: Secondary | ICD-10-CM | POA: Diagnosis not present

## 2019-12-18 DIAGNOSIS — K219 Gastro-esophageal reflux disease without esophagitis: Secondary | ICD-10-CM | POA: Diagnosis not present

## 2019-12-18 DIAGNOSIS — E039 Hypothyroidism, unspecified: Secondary | ICD-10-CM | POA: Diagnosis not present

## 2019-12-18 DIAGNOSIS — R7303 Prediabetes: Secondary | ICD-10-CM | POA: Diagnosis not present

## 2019-12-18 DIAGNOSIS — I509 Heart failure, unspecified: Secondary | ICD-10-CM | POA: Diagnosis not present

## 2019-12-18 DIAGNOSIS — G894 Chronic pain syndrome: Secondary | ICD-10-CM | POA: Diagnosis not present

## 2019-12-19 DIAGNOSIS — E8581 Light chain (AL) amyloidosis: Secondary | ICD-10-CM | POA: Diagnosis not present

## 2019-12-19 DIAGNOSIS — J849 Interstitial pulmonary disease, unspecified: Secondary | ICD-10-CM | POA: Diagnosis not present

## 2019-12-19 DIAGNOSIS — I5033 Acute on chronic diastolic (congestive) heart failure: Secondary | ICD-10-CM | POA: Diagnosis not present

## 2019-12-19 DIAGNOSIS — I2782 Chronic pulmonary embolism: Secondary | ICD-10-CM | POA: Diagnosis not present

## 2019-12-19 DIAGNOSIS — J690 Pneumonitis due to inhalation of food and vomit: Secondary | ICD-10-CM | POA: Diagnosis not present

## 2019-12-19 DIAGNOSIS — J9621 Acute and chronic respiratory failure with hypoxia: Secondary | ICD-10-CM | POA: Diagnosis not present

## 2019-12-28 DIAGNOSIS — J690 Pneumonitis due to inhalation of food and vomit: Secondary | ICD-10-CM | POA: Diagnosis not present

## 2019-12-28 DIAGNOSIS — J9621 Acute and chronic respiratory failure with hypoxia: Secondary | ICD-10-CM | POA: Diagnosis not present

## 2019-12-28 DIAGNOSIS — J849 Interstitial pulmonary disease, unspecified: Secondary | ICD-10-CM | POA: Diagnosis not present

## 2019-12-28 DIAGNOSIS — I5033 Acute on chronic diastolic (congestive) heart failure: Secondary | ICD-10-CM | POA: Diagnosis not present

## 2019-12-28 DIAGNOSIS — I2782 Chronic pulmonary embolism: Secondary | ICD-10-CM | POA: Diagnosis not present

## 2019-12-28 DIAGNOSIS — E8581 Light chain (AL) amyloidosis: Secondary | ICD-10-CM | POA: Diagnosis not present

## 2019-12-30 DIAGNOSIS — E8581 Light chain (AL) amyloidosis: Secondary | ICD-10-CM | POA: Diagnosis not present

## 2019-12-30 DIAGNOSIS — J690 Pneumonitis due to inhalation of food and vomit: Secondary | ICD-10-CM | POA: Diagnosis not present

## 2019-12-30 DIAGNOSIS — I2782 Chronic pulmonary embolism: Secondary | ICD-10-CM | POA: Diagnosis not present

## 2019-12-30 DIAGNOSIS — J849 Interstitial pulmonary disease, unspecified: Secondary | ICD-10-CM | POA: Diagnosis not present

## 2019-12-30 DIAGNOSIS — I5033 Acute on chronic diastolic (congestive) heart failure: Secondary | ICD-10-CM | POA: Diagnosis not present

## 2019-12-30 DIAGNOSIS — J9621 Acute and chronic respiratory failure with hypoxia: Secondary | ICD-10-CM | POA: Diagnosis not present

## 2020-01-02 DIAGNOSIS — J9621 Acute and chronic respiratory failure with hypoxia: Secondary | ICD-10-CM | POA: Diagnosis not present

## 2020-01-02 DIAGNOSIS — J690 Pneumonitis due to inhalation of food and vomit: Secondary | ICD-10-CM | POA: Diagnosis not present

## 2020-01-02 DIAGNOSIS — I2782 Chronic pulmonary embolism: Secondary | ICD-10-CM | POA: Diagnosis not present

## 2020-01-02 DIAGNOSIS — J849 Interstitial pulmonary disease, unspecified: Secondary | ICD-10-CM | POA: Diagnosis not present

## 2020-01-02 DIAGNOSIS — I5033 Acute on chronic diastolic (congestive) heart failure: Secondary | ICD-10-CM | POA: Diagnosis not present

## 2020-01-02 DIAGNOSIS — E8581 Light chain (AL) amyloidosis: Secondary | ICD-10-CM | POA: Diagnosis not present

## 2020-01-08 DIAGNOSIS — J9621 Acute and chronic respiratory failure with hypoxia: Secondary | ICD-10-CM | POA: Diagnosis not present

## 2020-01-08 DIAGNOSIS — I2782 Chronic pulmonary embolism: Secondary | ICD-10-CM | POA: Diagnosis not present

## 2020-01-08 DIAGNOSIS — J690 Pneumonitis due to inhalation of food and vomit: Secondary | ICD-10-CM | POA: Diagnosis not present

## 2020-01-08 DIAGNOSIS — I5033 Acute on chronic diastolic (congestive) heart failure: Secondary | ICD-10-CM | POA: Diagnosis not present

## 2020-01-08 DIAGNOSIS — J849 Interstitial pulmonary disease, unspecified: Secondary | ICD-10-CM | POA: Diagnosis not present

## 2020-01-08 DIAGNOSIS — E8581 Light chain (AL) amyloidosis: Secondary | ICD-10-CM | POA: Diagnosis not present

## 2020-01-09 DIAGNOSIS — J690 Pneumonitis due to inhalation of food and vomit: Secondary | ICD-10-CM | POA: Diagnosis not present

## 2020-01-09 DIAGNOSIS — I2782 Chronic pulmonary embolism: Secondary | ICD-10-CM | POA: Diagnosis not present

## 2020-01-09 DIAGNOSIS — E8581 Light chain (AL) amyloidosis: Secondary | ICD-10-CM | POA: Diagnosis not present

## 2020-01-09 DIAGNOSIS — J9621 Acute and chronic respiratory failure with hypoxia: Secondary | ICD-10-CM | POA: Diagnosis not present

## 2020-01-09 DIAGNOSIS — J849 Interstitial pulmonary disease, unspecified: Secondary | ICD-10-CM | POA: Diagnosis not present

## 2020-01-09 DIAGNOSIS — I5033 Acute on chronic diastolic (congestive) heart failure: Secondary | ICD-10-CM | POA: Diagnosis not present

## 2020-01-15 ENCOUNTER — Other Ambulatory Visit: Payer: Self-pay

## 2020-01-15 ENCOUNTER — Ambulatory Visit (INDEPENDENT_AMBULATORY_CARE_PROVIDER_SITE_OTHER): Payer: Medicare Other | Admitting: Ophthalmology

## 2020-01-15 ENCOUNTER — Encounter (INDEPENDENT_AMBULATORY_CARE_PROVIDER_SITE_OTHER): Payer: Self-pay | Admitting: Ophthalmology

## 2020-01-15 DIAGNOSIS — H353221 Exudative age-related macular degeneration, left eye, with active choroidal neovascularization: Secondary | ICD-10-CM

## 2020-01-15 MED ORDER — BEVACIZUMAB CHEMO INJECTION 1.25MG/0.05ML SYRINGE FOR KALEIDOSCOPE
1.2500 mg | INTRAVITREAL | Status: AC | PRN
Start: 2020-01-15 — End: 2020-01-15
  Administered 2020-01-15: 1.25 mg via INTRAVITREAL

## 2020-01-15 NOTE — Assessment & Plan Note (Signed)
Improved OS at 57-monthinterval post Avastin will repeat injection today and examination OU in 4 months

## 2020-01-15 NOTE — Progress Notes (Signed)
01/15/2020     CHIEF COMPLAINT Patient presents for Retina Follow Up   HISTORY OF PRESENT ILLNESS: Cindy [name] is a 86 y.o. female who presents to the clinic today for:   HPI    Retina Follow Up    Patient presents with  Wet AMD.  In left eye.  Severity is moderate.  Duration of 3 months.  Since onset it is stable.  I, the attending physician,  performed the HPI with the patient and updated documentation appropriately.          Comments    3 Month Wet AMD f\u OS. Possible Avastin OS. OCT  Pt can tell it is time for another inj. Pt denies any complaints.Pt is now using oxygen.       Last edited by Tilda Franco on 01/15/2020 10:02 AM. (History)      Referring physician: Celene Squibb, MD [place],  [place] [address]  HISTORICAL INFORMATION:   Selected notes from the MEDICAL RECORD NUMBER    Lab Results  Component Value Date   HGBA1C 6.1 (H) 03/14/2013     CURRENT MEDICATIONS: Current Outpatient Medications (Ophthalmic Drugs)  Medication Sig  . Polyethyl Glycol-Propyl Glycol (SYSTANE OP) Apply 1 drop to eye daily.   No current facility-administered medications for this visit. (Ophthalmic Drugs)   Current Outpatient Medications (Other)  Medication Sig  . acetaminophen (TYLENOL) 500 MG tablet Take 1,000 mg by mouth as needed.  Marland Kitchen albuterol (PROVENTIL) (2.5 MG/3ML) 0.083% nebulizer solution Take 3 mLs by nebulization 4 (four) times daily as needed.  . ALPRAZolam (XANAX) 0.5 MG tablet Take 1 tablet (0.5 mg total) by mouth at bedtime as needed for anxiety.  Marland Kitchen apixaban (ELIQUIS) 5 MG TABS tablet Take 2 tablets by mouth twice a day for 7 days; and after that 1 tablet twice a day on daily basis.  . calcium carbonate (TUMS - DOSED IN MG ELEMENTAL CALCIUM) 500 MG chewable tablet Chew 1 tablet by mouth daily.  . cholecalciferol (VITAMIN D) 1000 UNITS tablet Take 1,000 Units by mouth daily.    . CYANOCOBALAMIN PO Take 1 tablet by mouth daily.  Marland Kitchen  dextromethorphan 15 MG/5ML syrup Take 5 mLs by mouth 2 (two) times daily.  Marland Kitchen doxycycline (VIBRAMYCIN) 100 MG capsule Take 1 capsule (100 mg total) by mouth 2 (two) times daily. One po bid x 7 days  . esomeprazole (NEXIUM) 40 MG capsule Take 40 mg by mouth daily as needed.  (Patient not taking: Reported on 11/06/2019)  . fentaNYL (DURAGESIC) 25 MCG/HR Place 1 patch onto the skin every 3 (three) days.  Marland Kitchen guaiFENesin (MUCINEX) 600 MG 12 hr tablet Take 1,200 mg by mouth 2 (two) times daily.  Marland Kitchen guaiFENesin (ROBITUSSIN) 100 MG/5ML liquid Take 5 mLs by mouth 2 (two) times daily.  Marland Kitchen levothyroxine (SYNTHROID) 100 MCG tablet Take 100 mcg by mouth every morning.  . metoprolol succinate (TOPROL-XL) 25 MG 24 hr tablet Take 0.5-1 tablets (12.5-25 mg total) by mouth in the morning and at bedtime. Patient takes 1 tablet (30m) in the morning and 1/2 of a tablet (12.549m in the evening  . metoprolol tartrate (LOPRESSOR) 25 MG tablet Take 25 mg by mouth.  . Multiple Vitamin (MULTIVITAMIN ADULT PO) Take by mouth. (Patient not taking: Reported on 11/06/2019)  . Multiple Vitamins-Minerals (WOMENS HAIR, SKIN & NAILS PO) Take 1 tablet by mouth daily. (Patient not taking: Reported on 11/06/2019)  . omeprazole (PRILOSEC) 20 MG capsule Take  20 mg by mouth daily.  . ondansetron (ZOFRAN) 4 MG tablet Take 4 mg by mouth every 8 (eight) hours as needed.  . Potassium Chloride ER 20 MEQ TBCR Take 1 tablet by mouth daily.  Marland Kitchen senna (SENOKOT) 8.6 MG tablet Take 2 tablets by mouth as needed for constipation.  . sodium chloride (OCEAN) 0.65 % nasal spray Place 2 sprays into the nose daily as needed.   . torsemide (DEMADEX) 20 MG tablet Take 20 mg by mouth daily.   No current facility-administered medications for this visit. (Other)      REVIEW OF SYSTEMS:    ALLERGIES Allergies  Allergen Reactions  . Nebivolol Hcl     Other reaction(s): Other (See Comments) Chest tightness,sob,cough,severe tiredness,confusion  . Codeine  Nausea Only  . Sulfa Antibiotics Nausea Only  . Sulfonamide Derivatives Nausea Only    PAST MEDICAL HISTORY Past Medical History:  Diagnosis Date  . Cancer ([place])   . CHF (congestive heart failure) (HCC)    Amyloidosis  . Diarrhea   . Diverticulosis of colon (without mention of hemorrhage)   . Edema   . Esophagitis, unspecified   . Family history of malignant neoplasm of gastrointestinal tract   . Fibrosis of lung ([facility]) 03/14/2013  . Flatulence, eructation, and gas pain   . GERD (gastroesophageal reflux disease)   . Left sided ulcerative colitis ([place])   . Maxillary sinus mass   . Osteoporosis, unspecified   . Other and unspecified hyperlipidemia   . Other and unspecified noninfectious gastroenteritis and colitis(558.9)   . Paroxysmal atrial fibrillation ([facility]) 07/09/2019  . Stricture and stenosis of esophagus   . Unspecified hypothyroidism   . URI (upper respiratory infection)    Past Surgical History:  Procedure Laterality Date  . ABDOMINAL HYSTERECTOMY    . CATARACT EXTRACTION W/PHACO Bilateral    Dr. Gershon Crane  . CHOLECYSTECTOMY    . FOOT SURGERY    . THYROIDECTOMY, PARTIAL    . YAG LASER APPLICATION Right 0/35/0093   Procedure: YAG LASER APPLICATION;  Surgeon: Elta Guadeloupe T. Gershon Crane, MD;  Location: [facility];  Service: Ophthalmology;  Laterality: Right;    FAMILY HISTORY Family History  Problem Relation Age of Onset  . Colon cancer Mother   . Heart attack Father        CHF  . Stomach cancer Sister   . Diabetes Son   . Kidney cancer Son   . Adrenal disorder Son   . Heart disease Brother     SOCIAL HISTORY Social History   Tobacco Use  . Smoking status: Never Smoker  . Smokeless tobacco: Never Used  Substance Use Topics  . Alcohol use: No    Alcohol/week: 0.0 standard drinks  . Drug use: No         OPHTHALMIC EXAM: Base Eye Exam    Visual Acuity (Snellen - Linear)      Right Left   Dist [place] 20/50 E Card @ 5'   Dist ph  NI 20/200       Tonometry  (Tonopen, 10:05 AM)      Right Left   Pressure 10 12       Pupils      Dark Light Shape React APD   Right 3 3 Round Minimal None   Left 4 3 Round Slow None       Visual Fields (Counting fingers)      Left Right    Full Full       Neuro/Psych  Oriented x3: Yes   Mood/Affect: Normal       Dilation    Both eyes: 1.0% Mydriacyl, 2.5% Phenylephrine @ 10:05 AM        Slit Lamp and Fundus Exam    External Exam      Right Left   External Normal Normal       Slit Lamp Exam      Right Left   Lids/Lashes Normal Normal   Conjunctiva/Sclera White and quiet White and quiet   Cornea Clear Clear   Anterior Chamber Deep and quiet Deep and quiet   Iris Round and reactive Round and reactive   Lens  Posterior chamber intraocular lens   Anterior Vitreous Normal Normal       Fundus Exam      Right Left   Posterior Vitreous Posterior vitreous detachment ,,, Posterior vitreous detachment   Disc Normal Normal   C/D Ratio 0.2 0.1   Macula Hard drusen, Pigmented atrophy Atrophy, Age related macular degeneration, Early age related macular degeneration, Drusen   Vessels Normal Normal   Periphery Normal Normal          IMAGING AND PROCEDURES  Imaging and Procedures for 01/15/20  OCT, Retina - OU - Both Eyes       Right Eye Quality was good. Scan locations included subfoveal. Central Foveal Thickness: 311. Progression has been stable.   Left Eye Quality was good. Scan locations included subfoveal. Central Foveal Thickness: 267. Progression has improved.   Notes Much less CME intraretinal as compared to the disease of CNVM 1 year previously.  Been stable.  Coincidently with the use of oxygen for side effects of cancer       Intravitreal Injection, Pharmacologic Agent - OS - Left Eye       Time Out 01/15/2020. 11:03 AM. Confirmed correct patient, procedure, site, and patient consented.   Anesthesia Topical anesthesia was used. Anesthetic medications included Akten  3.5%.   Procedure Preparation included 10% betadine to eyelids, Tobramycin 0.3%. A supplied needle was used.   Injection:  1.25 mg Bevacizumab (AVASTIN) SOLN   NDC: [address]-7, Lot: [id number]   Route: Intravitreal, Site: Left Eye, Waste: 0 mg  Post-op Post injection exam found visual acuity of at least counting fingers. The patient tolerated the procedure well. There were no complications. The patient received written and verbal post procedure care education. Post injection medications were not given.                 ASSESSMENT/PLAN:  Exudative age-related macular degeneration of left eye with active choroidal neovascularization (HCC) Improved OS at 78-monthinterval post Avastin will repeat injection today and examination OU in 4 months      ICD-10-CM   1. Exudative age-related macular degeneration of left eye with active choroidal neovascularization (HCC)  H35.3221 OCT, Retina - OU - Both Eyes    Intravitreal Injection, Pharmacologic Agent - OS - Left Eye    Bevacizumab (AVASTIN) SOLN 1.25 mg    1.  As compared to December 2020, much less active CNVM OS currently at 328-monthnterval.  Intravitreal Avastin OS today.  2.  Coincidentally, the patient is using oxygen now for illness, cancer.  This will help maximize oxygen saturation for macular disease as well  3.  Repeat follow-up visit in 4 months  Ophthalmic Meds Ordered this visit:  Meds ordered this encounter  Medications  . Bevacizumab (AVASTIN) SOLN 1.25 mg       Return in about 4 months (  around 05/16/2020) for DILATE OU, AVASTIN OCT, OS.  There are no Patient Instructions on file for this visit.   Explained the diagnoses, plan, and follow up with the patient and they expressed understanding.  Patient expressed understanding of the importance of proper follow up care.   Clent Demark Brigette Hopfer M.D. Diseases & Surgery of the Retina and Vitreous [facility] [place] 01/15/20     Abbreviations: M myopia  (nearsighted); A astigmatism; H hyperopia (farsighted); P presbyopia; Mrx spectacle prescription;  CTL contact lenses; OD right eye; OS left eye; OU both eyes  XT exotropia; ET esotropia; PEK punctate epithelial keratitis; PEE punctate epithelial erosions; DES dry eye syndrome; MGD meibomian gland dysfunction; ATs artificial tears; PFAT's preservative free artificial tears; [place] nuclear sclerotic cataract; PSC posterior subcapsular cataract; ERM epi-retinal membrane; PVD posterior vitreous detachment; RD retinal detachment; DM diabetes mellitus; DR diabetic retinopathy; NPDR non-proliferative diabetic retinopathy; PDR proliferative diabetic retinopathy; CSME clinically significant macular edema; DME diabetic macular edema; dbh dot blot hemorrhages; CWS cotton wool spot; POAG primary open angle glaucoma; C/D cup-to-disc ratio; HVF humphrey visual field; GVF goldmann visual field; OCT optical coherence tomography; IOP intraocular pressure; BRVO Branch retinal vein occlusion; CRVO central retinal vein occlusion; CRAO central retinal artery occlusion; BRAO branch retinal artery occlusion; RT retinal tear; SB scleral buckle; PPV pars plana vitrectomy; VH Vitreous hemorrhage; PRP panretinal laser photocoagulation; IVK intravitreal kenalog; VMT vitreomacular traction; MH Macular hole;  NVD neovascularization of the disc; NVE neovascularization elsewhere; AREDS age related eye disease study; ARMD age related macular degeneration; POAG primary open angle glaucoma; EBMD epithelial/anterior basement membrane dystrophy; ACIOL anterior chamber intraocular lens; IOL intraocular lens; PCIOL posterior chamber intraocular lens; Phaco/IOL phacoemulsification with intraocular lens placement; [place] photorefractive keratectomy; LASIK laser assisted in situ keratomileusis; HTN hypertension; DM diabetes mellitus; COPD chronic obstructive pulmonary disease

## 2020-01-18 DIAGNOSIS — G894 Chronic pain syndrome: Secondary | ICD-10-CM | POA: Diagnosis not present

## 2020-01-18 DIAGNOSIS — E039 Hypothyroidism, unspecified: Secondary | ICD-10-CM | POA: Diagnosis not present

## 2020-01-18 DIAGNOSIS — I509 Heart failure, unspecified: Secondary | ICD-10-CM | POA: Diagnosis not present

## 2020-01-27 DIAGNOSIS — Z23 Encounter for immunization: Secondary | ICD-10-CM | POA: Diagnosis not present

## 2020-01-29 DIAGNOSIS — Z950 Presence of cardiac pacemaker: Secondary | ICD-10-CM | POA: Diagnosis not present

## 2020-02-08 ENCOUNTER — Encounter: Payer: Self-pay | Admitting: Internal Medicine

## 2020-02-08 ENCOUNTER — Other Ambulatory Visit: Payer: Self-pay

## 2020-02-08 ENCOUNTER — Ambulatory Visit (INDEPENDENT_AMBULATORY_CARE_PROVIDER_SITE_OTHER): Payer: Medicare Other | Admitting: Internal Medicine

## 2020-02-08 DIAGNOSIS — R0600 Dyspnea, unspecified: Secondary | ICD-10-CM

## 2020-02-08 DIAGNOSIS — R0609 Other forms of dyspnea: Secondary | ICD-10-CM

## 2020-02-08 NOTE — Progress Notes (Signed)
Cindy [name], female    DOB: Mar 27, 1933   .   MRN: [id number]   Brief patient profile:  3 yowf never smoker eval for doe in 2015 ultimately dx with cardiac amyloidosis by [facility] cards  on chemo per by [facility]  and then PE July 2021.   Admit date: 07/08/2019 Discharge date: 07/10/2019    Recommendations for Outpatient Follow-up:  1. Repeat CBC to follow hemoglobin trend 2. Repeat basic metabolic panel to follow electrolytes and renal function   Discharge Diagnoses:  Principal Problem:   Pulmonary embolism (HCC)   Hypothyroidism   Hyperlipidemia   GERD (gastroesophageal reflux disease)   Left sided ulcerative colitis ([place])   Interstitial pneumonitis (HCC)   Hypertension   Chronic diastolic heart failure (HCC)   Paroxysmal atrial fibrillation (HCC)   Light chain (AL) amyloidosis (HCC)   Discharge Condition: Stable and improved.  Patient discharged home with instruction to follow-up with PCP and with hematology/oncology service as an outpatient.      History of present illness:  As per H&P written by Dr. Olevia Bowens on 07/08/2019 86 y.o.femalewith medical history significant ofcancer, chronic diastolic CHF, amyloidosis, diarrhea, diverticulosis, esophagitis, GERD, stricturing a stenosis of esophagus, hypothyroidism, flatulence, left-sided ulcerative colitis, maxillary sinus mass, fibrosis of the lungs, osteoporosis, hyperlipidemia who is coming to the emergency department due to left-sided chest pain that started 2 hours prior to arrival to the emergency department.The pain has sudden onset. It was nonradiated, sharp associated with significant dyspnea. She denies palpitations, dizziness, diaphoresis, PND or orthopnea. She has unilateral left lower extremity edema, which was checked for DVT with ultrasound on June 27, 2019 with negative results. She thought that she was having GERD related symptoms took multiple Tums tablet and while her Nexium 40 mg capsules without  significant relief. She denies fever, chills, rhinorrhea, sore throat, wheezing or hemoptysis. No abdominal pain, diarrhea, constipation, melena or hematochezia. Denies dysuria, frequency or hematuria. No polyuria, polydipsia, polyphagia or blurred vision.  ED Course:Initial vital signs were temperature 99 F, pulse 78, respirations 17, blood pressure 132/64 mmHg and O2 sat 98% on room air. The patient was given 324 mg of chewable aspirin and was started on a heparin infusion. PCCM was contacted due to questionable right ventricle strain, but the patient does not meet criteria for transfer at this time.  CBC showed a white count of 7.0, hemoglobin 12.0 g/dL and platelets 239. D-dimer was 0.88. CMP showed a glucose of 104 mg/dL and total protein 6.2 g/dL, all other values are within normal range. Lipase and troponin x2 have been normal.  Imaging:Portable chest showed diffuse bilateral coarse interstitial opacities which could be due to acute interstitial edema or atypical infection versus progression of ILD. CTA chest showed a pulmonary artery emboli involving the right main pulmonary artery extending into the lobar and segmental arteries of the right upper lobe and apical segment. There is also additional small subsegmental pulmonary embolus of the apical posterior segment left upper lobe. No elevation of the RV/LV rate you however there is right atrial enlargement, central pulmonary artery enlargement and reflux of contrast into the hepatic veins and IVC. There is consolidative opacity in the right upper lung which may reflect an area of pulmonary infarct or infection/inflammatory consolidation. Features of pulmonary edema on a background of chronic interstitial fibrosis. There is mild cardiomegaly with compression of the right heart by a pectus deformity of the chest. Please see images and full cardiology report for further detail.  Hospital Course:  1-Pulmonary embolism  ([place]) -Currently denies significant chest discomfort and demonstrate good oxygen saturation on room air -Patient completed 24 hours of IV heparin drip and subsequently transition to adjusted dose of Eliquis as per hematology/oncology service recommendations. -2D echo demonstrating a stable ejection fraction, no wall motion and normalities and no right heart strain. -Hematology/oncology service at [facility] (Dr. Amalia Hailey) contacted for recommendations about type of anticoagulation to be used at discharge.  -Recommendations given for full dose Eliquis after usual loading dose of 7 days.  -No signs of overt bleeding seen.   -Patient will follow up with hematology/oncology service after discharge.  2-Hypothyroidism -Continue Synthroid. -Follow thyroid panel as an outpatient and nodules Synthroid dosage as needed.  3-chronic paroxysmal atrial fibrillation -Rate controlled and currently in sinus rhythm -Continue beta-blocker current dose -Continue outpatient follow-up with cardiology service. -Will be discharge on adjusted dose of Eliquis as recommended by hematology/oncology service.  4-chronic diastolic heart failure -Appears to be stable and compensated -Continue to follow daily weights and low-sodium diet -Continue the use of beta-blocker and daily Demadex.  5-GERD (gastroesophageal reflux disease) -Continue PPI.  6-chronic pain syndrome -Continue the use of chronic fentanyl patch and as needed Tylenol.. -Patient expressedpainto be stable and well controlled.  7-history of interstitial pneumonitis-appears to be secondary to use of amiodarone in the past for prior treatment of A. fib -Continue as needed bronchodilators -No requiring oxygen supplementation. -Patient reports no shortness of breath.    History of Present Illness  02/08/2020  Pulmonary/ 1st office eval/ Cindy [name] / [place] [facility]  Re doe  Chief Complaint  Patient presents with  . Pulmonary Consult    Referred by  Demetrius Revel, NP. Pt states has had SOB "for years"- progressively getting worse. She states she gets winded doing housework and walking short distances.   Dyspnea:  Has not been able to do grocery store aisles x "I don't know"  ? How far can you walk s stopping on level ground ?  Never responded to question "can't do housework"  Which ones ?  A all of them Cough:  None  Sleep: bed is flat with 2 pillows none  SABA use: doesn't help   No obvious day to day or daytime variability or assoc excess/ purulent sputum or mucus plugs or hemoptysis or cp or chest tightness, subjective wheeze or overt sinus or hb symptoms.   Sleeping  without nocturnal  or early am exacerbation  of respiratory  c/o's or need for noct saba. Also denies any obvious fluctuation of symptoms with weather or environmental changes or other aggravating or alleviating factors except as outlined above   No unusual exposure hx or h/o childhood pna/ asthma or knowledge of premature birth.  Current Allergies, Complete Past Medical History, Past Surgical History, Family History, and Social History were reviewed in [employer] record.  ROS  The following are not active complaints unless bolded Hoarseness, sore throat, dysphagia, dental problems, itching, sneezing,  nasal congestion or discharge of excess mucus or purulent secretions, ear ache,   fever, chills, sweats, unintended wt loss or wt gain, classically pleuritic or exertional cp,  orthopnea pnd or arm/hand swelling  or leg swelling, presyncope, palpitations, abdominal pain, anorexia, nausea, vomiting, diarrhea  or change in bowel habits or change in bladder habits, change in stools or change in urine, dysuria, hematuria,  rash, arthralgias, visual complaints, headache, numbness, weakness or ataxia or problems with walking or coordination,  change in mood or  memory.  Past Medical History:  Diagnosis Date  . Cancer ([place])   . CHF (congestive  heart failure) (HCC)    Amyloidosis  . Diarrhea   . Diverticulosis of colon (without mention of hemorrhage)   . Edema   . Esophagitis, unspecified   . Family history of malignant neoplasm of gastrointestinal tract   . Fibrosis of lung ([place]) 03/14/2013  . Flatulence, eructation, and gas pain   . GERD (gastroesophageal reflux disease)   . Left sided ulcerative colitis ([place])   . Maxillary sinus mass   . Osteoporosis, unspecified   . Other and unspecified hyperlipidemia   . Other and unspecified noninfectious gastroenteritis and colitis(558.9)   . Paroxysmal atrial fibrillation ([place]) 07/09/2019  . Stricture and stenosis of esophagus   . Unspecified hypothyroidism   . URI (upper respiratory infection)     Outpatient Medications Prior to Visit - - NOTE:   Unable to verify as accurately reflecting what pt takes     Medication Sig Dispense Refill  . acetaminophen (TYLENOL) 500 MG tablet Take 1,000 mg by mouth as needed.    Marland Kitchen albuterol (PROVENTIL) (2.5 MG/3ML) 0.083% nebulizer solution Take 3 mLs by nebulization 4 (four) times daily as needed.    . ALPRAZolam (XANAX) 0.5 MG tablet Take 1 tablet (0.5 mg total) by mouth at bedtime as needed for anxiety. 30 tablet 3  . apixaban (ELIQUIS) 5 MG TABS tablet Take 2 tablets by mouth twice a day for 7 days; and after that 1 tablet twice a day on daily basis. 60 tablet 2  . calcium carbonate (TUMS - DOSED IN MG ELEMENTAL CALCIUM) 500 MG chewable tablet Chew 1 tablet by mouth daily.    . cholecalciferol (VITAMIN D) 1000 UNITS tablet Take 1,000 Units by mouth daily.      . CYANOCOBALAMIN PO Take 1 tablet by mouth daily.    Marland Kitchen dextromethorphan 15 MG/5ML syrup Take 5 mLs by mouth 2 (two) times daily.    . fentaNYL (DURAGESIC) 25 MCG/HR Place 1 patch onto the skin every 3 (three) days.    Marland Kitchen levothyroxine (SYNTHROID) 100 MCG tablet Take 100 mcg by mouth every morning.    . metoprolol succinate (TOPROL-XL) 25 MG 24 hr tablet Take 0.5-1 tablets (12.5-25 mg  total) by mouth in the morning and at bedtime. Patient takes 1 tablet (75m) in the morning and 1/2 of a tablet (12.540m in the evening    . metoprolol tartrate (LOPRESSOR) 25 MG tablet Take 25 mg by mouth.    . Marland Kitchenmeprazole (PRILOSEC) 20 MG capsule Take 20 mg by mouth daily.    . ondansetron (ZOFRAN) 4 MG tablet Take 4 mg by mouth every 8 (eight) hours as needed.    . Vladimir Fasterlycol-Propyl Glycol (SYSTANE OP) Apply 1 drop to eye daily.    . Marland Kitchenenna (SENOKOT) 8.6 MG tablet Take 2 tablets by mouth as needed for constipation.    . sodium chloride (OCEAN) 0.65 % nasal spray Place 2 sprays into the nose daily as needed.     . torsemide (DEMADEX) 20 MG tablet Take 20 mg by mouth daily.    . Marland Kitchenoxycycline (VIBRAMYCIN) 100 MG capsule Take 1 capsule (100 mg total) by mouth 2 (two) times daily. One po bid x 7 days 14 capsule 0  . esomeprazole (NEXIUM) 40 MG capsule Take 40 mg by mouth daily as needed.  (Patient not taking: Reported on 11/06/2019)    . guaiFENesin (MUCINEX) 600 MG 12 hr tablet Take 1,200 mg by mouth  2 (two) times daily.    Marland Kitchen guaiFENesin (ROBITUSSIN) 100 MG/5ML liquid Take 5 mLs by mouth 2 (two) times daily.    . Multiple Vitamin (MULTIVITAMIN ADULT PO) Take by mouth.     . Multiple Vitamins-Minerals (WOMENS HAIR, SKIN & NAILS PO) Take 1 tablet by mouth daily. (Patient not taking: Reported on 11/06/2019)    . Potassium Chloride ER 20 MEQ TBCR Take 1 tablet by mouth daily.     No facility-administered medications prior to visit.     Objective:     BP 108/64   Pulse 79   Temp (!) 97.2 F (36.2 C) (Temporal)   Ht 5' 8"  (1.727 m)   Wt 123 lb (55.8 kg)   SpO2 96% Comment: on RA  BMI 18.70 kg/m   SpO2: 96 % (on RA)  amb anxious wf fails to answer questions asked in a straightforward manner, tending to go off on tangents or answer questions with ambiguous medical terms or diagnoses and seemed irritated   when asked the same question more than once for clarification to point of never  actually answering the question.    HEENT : pt wearing mask not removed for exam due to covid -19 concerns.    NECK :  without JVD/Nodes/TM/ nl carotid upstrokes bilaterally   LUNGS: no acc muscle use,  Nl contour chest which is clear to A and P bilaterally without cough on insp or exp maneuvers   CV:  RRR  no s3 or murmur or increase in P2, and  L leg >> R Leg swelling/ pitting    ABD:  soft and nontender with nl inspiratory excursion in the supine position. No bruits or organomegaly appreciated, bowel sounds nl  MS:  Nl gait/ ext warm without deformities, calf tenderness, cyanosis or clubbing No obvious joint restrictions   SKIN: warm and dry without lesions    NEURO:  alert, approp, nl sensorium with  no motor or cerebellar deficits apparent.     I personally reviewed images and agree with radiology impression as follows:   Chest CTa  07/08/19 Diffuse fine reticular opacities in the lung periphery likely reflecting some underlying interstitial fibrosis there is diffuse interlobular septal thickening with superimposed areas of hazy ground-glass opacity favoring developing pulmonary edema. Consolidative opacity in the right upper lung could reflect an area of pulmonary infarct or infectious/inflammatory consolidation particularly given the presence of other consolidative opacities in the lingula and right lower lobe. Chronic right pleural effusion, similar in size to the comparison study. My review:  These changes are all minimal including the R effusion     Assessment   No problem-specific Assessment & Plan notes found for this encounter.     Christinia Gully, MD 02/09/2020

## 2020-02-08 NOTE — Patient Instructions (Addendum)
Make sure you check your oxygen saturations at highest level of activity to be sure it stays over 90% and adjust  02 flow upward to maintain this level if needed but remember to turn it back to previous settings when you stop (to conserve your supply).    We will adapt due to best fit analysis for portable 02 > did not qualify for ambulatory 02 02/08/2020   Pulmonary follow up is as needed here in [place]

## 2020-02-09 ENCOUNTER — Encounter: Payer: Self-pay | Admitting: Internal Medicine

## 2020-02-09 NOTE — Assessment & Plan Note (Addendum)
Onset 2015  - 04/17/2014  Walked RA x 3 laps @ 185 ft each stopped due to fatigue, no desat, nl pace  - Echo 07/09/19  1. Left ventricular ejection fraction, by estimation, is 60 to 65%. The  left ventricle has normal function. The left ventricle has no regional  wall motion abnormalities. There is mild left ventricular hypertrophy.  Left ventricular diastolic parameters are indeterminate.  2. Right ventricular systolic function is normal. The right ventricular  size is normal. There is moderately elevated pulmonary artery systolic  pressure. The estimated right ventricular systolic pressure is 89.3 mmHg.  3. Left atrial size was mildly dilated.  4. The mitral valve is rheumatic. Moderate mitral valve regurgitation. No  evidence of mitral stenosis.  5. Tricuspid valve regurgitation is moderate.  6. The aortic valve is normal in structure. Aortic valve regurgitation is  not visualized. Mild aortic valve sclerosis is present, with no evidence  of aortic valve stenosis.  7. The inferior vena cava is normal in size with greater than 50%  respiratory variability, suggesting right atrial pressure of 3 mmHg.  -Initially refused to walk in office 02/08/2020 then agreed when told we couldn't qualify her for amb 02 s study:    Walked RA  approx   400 ft  @ avg pace  stopped due to  Tired but min sob with sats still 94% at end of study    As on previous eval,  There does not appear enough pulmonary dz either related to amyloid lung dz or exp to chemo or amiodarone or prior to PE to limit her activity at this point or warrant any specific pulmonary rx other than to encourage her to to regular paced exercise at submax level and check 02 sats occasionally at highest level of activity with goal of keeping > 90%  She has f/u at [facility] and [facility] already and does not need another center of care here - if she has pulmonary issues should be referred to [facility] or [facility] in concert with the specialists she's already  seeing there and we can certainly see ad hoc if it's hard for her to see one of them in the event she does decompensate.          Each maintenance medication was reviewed in detail including emphasizing most importantly the difference between maintenance and prns and under what circumstances the prns are to be triggered using an action plan format where appropriate.  Total time for H and P, chart review, counseling,  directly observing portions of ambulatory 02 saturation study/  and generating customized AVS unique to this office visit / charting  > 60 min

## 2020-02-20 ENCOUNTER — Other Ambulatory Visit (HOSPITAL_COMMUNITY): Payer: Self-pay | Admitting: Cardiology

## 2020-02-20 DIAGNOSIS — Z7901 Long term (current) use of anticoagulants: Secondary | ICD-10-CM | POA: Diagnosis not present

## 2020-02-20 DIAGNOSIS — Z86718 Personal history of other venous thrombosis and embolism: Secondary | ICD-10-CM | POA: Diagnosis not present

## 2020-02-20 DIAGNOSIS — R0602 Shortness of breath: Secondary | ICD-10-CM | POA: Diagnosis not present

## 2020-02-20 DIAGNOSIS — I5032 Chronic diastolic (congestive) heart failure: Secondary | ICD-10-CM | POA: Diagnosis not present

## 2020-02-20 DIAGNOSIS — E854 Organ-limited amyloidosis: Secondary | ICD-10-CM

## 2020-02-20 DIAGNOSIS — Z79899 Other long term (current) drug therapy: Secondary | ICD-10-CM | POA: Diagnosis not present

## 2020-02-20 DIAGNOSIS — J849 Interstitial pulmonary disease, unspecified: Secondary | ICD-10-CM | POA: Diagnosis not present

## 2020-02-20 DIAGNOSIS — Z9889 Other specified postprocedural states: Secondary | ICD-10-CM | POA: Diagnosis not present

## 2020-02-20 DIAGNOSIS — Z950 Presence of cardiac pacemaker: Secondary | ICD-10-CM | POA: Diagnosis not present

## 2020-02-20 DIAGNOSIS — I43 Cardiomyopathy in diseases classified elsewhere: Secondary | ICD-10-CM | POA: Diagnosis not present

## 2020-02-20 DIAGNOSIS — E039 Hypothyroidism, unspecified: Secondary | ICD-10-CM | POA: Diagnosis not present

## 2020-02-20 DIAGNOSIS — I480 Paroxysmal atrial fibrillation: Secondary | ICD-10-CM | POA: Diagnosis not present

## 2020-02-20 DIAGNOSIS — Z86711 Personal history of pulmonary embolism: Secondary | ICD-10-CM | POA: Diagnosis not present

## 2020-02-21 DIAGNOSIS — Z950 Presence of cardiac pacemaker: Secondary | ICD-10-CM | POA: Diagnosis not present

## 2020-02-26 ENCOUNTER — Other Ambulatory Visit: Payer: Self-pay

## 2020-02-26 ENCOUNTER — Ambulatory Visit (HOSPITAL_COMMUNITY)
Admission: RE | Admit: 2020-02-26 | Discharge: 2020-02-26 | Disposition: A | Discharge status: Home / Self Care | Payer: Medicare Other | Source: Ambulatory Visit | Attending: Cardiology | Admitting: Cardiology

## 2020-02-26 DIAGNOSIS — E854 Organ-limited amyloidosis: Secondary | ICD-10-CM

## 2020-02-26 DIAGNOSIS — R0602 Shortness of breath: Secondary | ICD-10-CM | POA: Diagnosis not present

## 2020-02-26 DIAGNOSIS — Z950 Presence of cardiac pacemaker: Secondary | ICD-10-CM | POA: Diagnosis not present

## 2020-02-26 DIAGNOSIS — I43 Cardiomyopathy in diseases classified elsewhere: Secondary | ICD-10-CM | POA: Diagnosis not present

## 2020-02-26 DIAGNOSIS — I5032 Chronic diastolic (congestive) heart failure: Secondary | ICD-10-CM

## 2020-02-26 LAB — ECHOCARDIOGRAM COMPLETE
Area-P 1/2: 4.33 cm2
S' Lateral: 2.63 cm

## 2020-02-26 NOTE — Progress Notes (Signed)
*  PRELIMINARY RESULTS* Echocardiogram 2D Echocardiogram has been performed.  Cindy [name] 02/26/2020, 11:14 AM

## 2020-02-27 DIAGNOSIS — I4891 Unspecified atrial fibrillation: Secondary | ICD-10-CM | POA: Diagnosis not present

## 2020-02-27 DIAGNOSIS — E8581 Light chain (AL) amyloidosis: Secondary | ICD-10-CM | POA: Diagnosis not present

## 2020-02-27 DIAGNOSIS — E871 Hypo-osmolality and hyponatremia: Secondary | ICD-10-CM | POA: Diagnosis not present

## 2020-02-27 DIAGNOSIS — I509 Heart failure, unspecified: Secondary | ICD-10-CM | POA: Diagnosis not present

## 2020-02-27 DIAGNOSIS — G8929 Other chronic pain: Secondary | ICD-10-CM | POA: Diagnosis not present

## 2020-02-27 DIAGNOSIS — Z87440 Personal history of urinary (tract) infections: Secondary | ICD-10-CM | POA: Diagnosis not present

## 2020-02-27 DIAGNOSIS — Z885 Allergy status to narcotic agent status: Secondary | ICD-10-CM | POA: Diagnosis not present

## 2020-02-27 DIAGNOSIS — Z882 Allergy status to sulfonamides status: Secondary | ICD-10-CM | POA: Diagnosis not present

## 2020-02-27 DIAGNOSIS — H3530 Unspecified macular degeneration: Secondary | ICD-10-CM | POA: Diagnosis not present

## 2020-02-27 DIAGNOSIS — E854 Organ-limited amyloidosis: Secondary | ICD-10-CM | POA: Diagnosis not present

## 2020-02-27 DIAGNOSIS — M546 Pain in thoracic spine: Secondary | ICD-10-CM | POA: Diagnosis not present

## 2020-02-27 DIAGNOSIS — Z7901 Long term (current) use of anticoagulants: Secondary | ICD-10-CM | POA: Diagnosis not present

## 2020-02-27 DIAGNOSIS — I43 Cardiomyopathy in diseases classified elsewhere: Secondary | ICD-10-CM | POA: Diagnosis not present

## 2020-02-27 DIAGNOSIS — I2699 Other pulmonary embolism without acute cor pulmonale: Secondary | ICD-10-CM | POA: Diagnosis not present

## 2020-03-04 DIAGNOSIS — I509 Heart failure, unspecified: Secondary | ICD-10-CM | POA: Diagnosis not present

## 2020-03-04 DIAGNOSIS — E039 Hypothyroidism, unspecified: Secondary | ICD-10-CM | POA: Diagnosis not present

## 2020-03-04 DIAGNOSIS — F4542 Pain disorder with related psychological factors: Secondary | ICD-10-CM | POA: Diagnosis not present

## 2020-03-04 DIAGNOSIS — G894 Chronic pain syndrome: Secondary | ICD-10-CM | POA: Diagnosis not present

## 2020-03-22 DIAGNOSIS — N390 Urinary tract infection, site not specified: Secondary | ICD-10-CM | POA: Diagnosis not present

## 2020-03-22 DIAGNOSIS — J99 Respiratory disorders in diseases classified elsewhere: Secondary | ICD-10-CM | POA: Diagnosis not present

## 2020-03-22 DIAGNOSIS — I4891 Unspecified atrial fibrillation: Secondary | ICD-10-CM | POA: Diagnosis not present

## 2020-03-22 DIAGNOSIS — Z0189 Encounter for other specified special examinations: Secondary | ICD-10-CM | POA: Diagnosis not present

## 2020-03-22 DIAGNOSIS — E859 Amyloidosis, unspecified: Secondary | ICD-10-CM | POA: Diagnosis not present

## 2020-03-22 DIAGNOSIS — E039 Hypothyroidism, unspecified: Secondary | ICD-10-CM | POA: Diagnosis not present

## 2020-03-22 DIAGNOSIS — R21 Rash and other nonspecific skin eruption: Secondary | ICD-10-CM | POA: Diagnosis not present

## 2020-03-22 DIAGNOSIS — I340 Nonrheumatic mitral (valve) insufficiency: Secondary | ICD-10-CM | POA: Diagnosis not present

## 2020-03-22 DIAGNOSIS — I509 Heart failure, unspecified: Secondary | ICD-10-CM | POA: Diagnosis not present

## 2020-03-22 DIAGNOSIS — J690 Pneumonitis due to inhalation of food and vomit: Secondary | ICD-10-CM | POA: Diagnosis not present

## 2020-03-22 DIAGNOSIS — G894 Chronic pain syndrome: Secondary | ICD-10-CM | POA: Diagnosis not present

## 2020-03-22 DIAGNOSIS — I43 Cardiomyopathy in diseases classified elsewhere: Secondary | ICD-10-CM | POA: Diagnosis not present

## 2020-03-26 DIAGNOSIS — J704 Drug-induced interstitial lung disorders, unspecified: Secondary | ICD-10-CM | POA: Diagnosis not present

## 2020-03-26 DIAGNOSIS — E039 Hypothyroidism, unspecified: Secondary | ICD-10-CM | POA: Diagnosis not present

## 2020-03-26 DIAGNOSIS — R7303 Prediabetes: Secondary | ICD-10-CM | POA: Diagnosis not present

## 2020-03-26 DIAGNOSIS — K219 Gastro-esophageal reflux disease without esophagitis: Secondary | ICD-10-CM | POA: Diagnosis not present

## 2020-03-26 DIAGNOSIS — G894 Chronic pain syndrome: Secondary | ICD-10-CM | POA: Diagnosis not present

## 2020-03-26 DIAGNOSIS — I509 Heart failure, unspecified: Secondary | ICD-10-CM | POA: Diagnosis not present

## 2020-03-26 DIAGNOSIS — E859 Amyloidosis, unspecified: Secondary | ICD-10-CM | POA: Diagnosis not present

## 2020-03-26 DIAGNOSIS — E785 Hyperlipidemia, unspecified: Secondary | ICD-10-CM | POA: Diagnosis not present

## 2020-03-26 DIAGNOSIS — R945 Abnormal results of liver function studies: Secondary | ICD-10-CM | POA: Diagnosis not present

## 2020-04-05 DIAGNOSIS — N3020 Other chronic cystitis without hematuria: Secondary | ICD-10-CM | POA: Diagnosis not present

## 2020-04-05 DIAGNOSIS — N952 Postmenopausal atrophic vaginitis: Secondary | ICD-10-CM | POA: Diagnosis not present

## 2020-04-19 DIAGNOSIS — J704 Drug-induced interstitial lung disorders, unspecified: Secondary | ICD-10-CM | POA: Diagnosis not present

## 2020-04-19 DIAGNOSIS — E039 Hypothyroidism, unspecified: Secondary | ICD-10-CM | POA: Diagnosis not present

## 2020-04-19 DIAGNOSIS — I509 Heart failure, unspecified: Secondary | ICD-10-CM | POA: Diagnosis not present

## 2020-04-19 DIAGNOSIS — R945 Abnormal results of liver function studies: Secondary | ICD-10-CM | POA: Diagnosis not present

## 2020-04-19 DIAGNOSIS — K219 Gastro-esophageal reflux disease without esophagitis: Secondary | ICD-10-CM | POA: Diagnosis not present

## 2020-04-19 DIAGNOSIS — E859 Amyloidosis, unspecified: Secondary | ICD-10-CM | POA: Diagnosis not present

## 2020-04-19 DIAGNOSIS — G894 Chronic pain syndrome: Secondary | ICD-10-CM | POA: Diagnosis not present

## 2020-04-19 DIAGNOSIS — E785 Hyperlipidemia, unspecified: Secondary | ICD-10-CM | POA: Diagnosis not present

## 2020-04-19 DIAGNOSIS — R7303 Prediabetes: Secondary | ICD-10-CM | POA: Diagnosis not present

## 2020-05-18 DIAGNOSIS — R7303 Prediabetes: Secondary | ICD-10-CM | POA: Diagnosis not present

## 2020-05-18 DIAGNOSIS — I509 Heart failure, unspecified: Secondary | ICD-10-CM | POA: Diagnosis not present

## 2020-05-18 DIAGNOSIS — E859 Amyloidosis, unspecified: Secondary | ICD-10-CM | POA: Diagnosis not present

## 2020-05-18 DIAGNOSIS — J704 Drug-induced interstitial lung disorders, unspecified: Secondary | ICD-10-CM | POA: Diagnosis not present

## 2020-05-18 DIAGNOSIS — K219 Gastro-esophageal reflux disease without esophagitis: Secondary | ICD-10-CM | POA: Diagnosis not present

## 2020-05-18 DIAGNOSIS — R945 Abnormal results of liver function studies: Secondary | ICD-10-CM | POA: Diagnosis not present

## 2020-05-18 DIAGNOSIS — E785 Hyperlipidemia, unspecified: Secondary | ICD-10-CM | POA: Diagnosis not present

## 2020-05-18 DIAGNOSIS — E039 Hypothyroidism, unspecified: Secondary | ICD-10-CM | POA: Diagnosis not present

## 2020-05-18 DIAGNOSIS — G894 Chronic pain syndrome: Secondary | ICD-10-CM | POA: Diagnosis not present

## 2020-05-20 ENCOUNTER — Ambulatory Visit (INDEPENDENT_AMBULATORY_CARE_PROVIDER_SITE_OTHER): Payer: Medicare Other | Admitting: Ophthalmology

## 2020-05-20 ENCOUNTER — Other Ambulatory Visit: Payer: Self-pay

## 2020-05-20 ENCOUNTER — Encounter (INDEPENDENT_AMBULATORY_CARE_PROVIDER_SITE_OTHER): Payer: Self-pay | Admitting: Ophthalmology

## 2020-05-20 DIAGNOSIS — H353133 Nonexudative age-related macular degeneration, bilateral, advanced atrophic without subfoveal involvement: Secondary | ICD-10-CM

## 2020-05-20 DIAGNOSIS — H353221 Exudative age-related macular degeneration, left eye, with active choroidal neovascularization: Secondary | ICD-10-CM

## 2020-05-20 MED ORDER — BEVACIZUMAB 2.5 MG/0.1ML IZ SOSY
2.5000 mg | PREFILLED_SYRINGE | INTRAVITREAL | Status: AC | PRN
  Administered 2020-05-20: 2.5 mg via INTRAVITREAL

## 2020-05-20 NOTE — Assessment & Plan Note (Signed)
The nature of wet macular degeneration was discussed with the patient.  Forms of therapy reviewed include the use of Anti-VEGF medications injected painlessly into the eye, as well as other possible treatment modalities, including thermal laser therapy. Fellow eye involvement and risks were discussed with the patient. Upon the finding of wet age related macular degeneration, treatment will be offered. The treatment regimen is on a treat as needed basis with the intent to treat if necessary and extend interval of exams when possible. On average 1 out of 6 patients do not need lifetime therapy. However, the risk of recurrent disease is high for a lifetime.  Initially monthly, then periodic, examinations and evaluations will determine whether the next treatment is required on the day of the examination.  At 90-monthinterval recurrence of CME and intraretinal fluid will need repeat injection Avastin today and follow-up in shorter interval 6 weeks

## 2020-05-20 NOTE — Patient Instructions (Signed)
Patient instructed to contact the office for new onset visual acuity declines or distortions in either eye

## 2020-05-20 NOTE — Progress Notes (Signed)
05/20/2020     CHIEF COMPLAINT Patient presents for Retina Follow Up (4 Month Wet AMD f\u. Possible Avastin OS. OCT/Pt c/o NVA being worse. Pt states she cannot read anything up close. )   HISTORY OF PRESENT ILLNESS: Cindy [name] is a 87 y.o. female who presents to the clinic today for:   HPI    Retina Follow Up    Patient presents with  Wet AMD.  In left eye.  Severity is moderate.  Duration of 4 months.  Since onset it is stable.  I, the attending physician,  performed the HPI with the patient and updated documentation appropriately. Additional comments: 4 Month Wet AMD f\u. Possible Avastin OS. OCT Pt c/o NVA being worse. Pt states she cannot read anything up close.        Last edited by Tilda Franco on 05/20/2020 10:38 AM. (History)      Referring physician: Celene Squibb, MD [place],   [id number]  HISTORICAL INFORMATION:   Selected notes from the MEDICAL RECORD NUMBER    Lab Results  Component Value Date   HGBA1C 6.1 (H) 03/14/2013     CURRENT MEDICATIONS: Current Outpatient Medications (Ophthalmic Drugs)  Medication Sig  . Polyethyl Glycol-Propyl Glycol (SYSTANE OP) Apply 1 drop to eye daily.   No current facility-administered medications for this visit. (Ophthalmic Drugs)   Current Outpatient Medications (Other)  Medication Sig  . acetaminophen (TYLENOL) 500 MG tablet Take 1,000 mg by mouth as needed.  Marland Kitchen albuterol (PROVENTIL) (2.5 MG/3ML) 0.083% nebulizer solution Take 3 mLs by nebulization 4 (four) times daily as needed.  . ALPRAZolam (XANAX) 0.5 MG tablet Take 1 tablet (0.5 mg total) by mouth at bedtime as needed for anxiety.  Marland Kitchen apixaban (ELIQUIS) 5 MG TABS tablet Take 2 tablets by mouth twice a day for 7 days; and after that 1 tablet twice a day on daily basis.  . calcium carbonate (TUMS - DOSED IN MG ELEMENTAL CALCIUM) 500 MG chewable tablet Chew 1 tablet by mouth daily.  . cholecalciferol (VITAMIN D) 1000 UNITS tablet Take  1,000 Units by mouth daily.    . CYANOCOBALAMIN PO Take 1 tablet by mouth daily.  Marland Kitchen dextromethorphan 15 MG/5ML syrup Take 5 mLs by mouth 2 (two) times daily.  . fentaNYL (DURAGESIC) 25 MCG/HR Place 1 patch onto the skin every 3 (three) days.  Marland Kitchen levothyroxine (SYNTHROID) 100 MCG tablet Take 100 mcg by mouth every morning.  . metoprolol succinate (TOPROL-XL) 25 MG 24 hr tablet Take 0.5-1 tablets (12.5-25 mg total) by mouth in the morning and at bedtime. Patient takes 1 tablet (73m) in the morning and 1/2 of a tablet (12.586m in the evening  . metoprolol tartrate (LOPRESSOR) 25 MG tablet Take 25 mg by mouth.  . Marland Kitchenmeprazole (PRILOSEC) 20 MG capsule Take 20 mg by mouth daily.  . ondansetron (ZOFRAN) 4 MG tablet Take 4 mg by mouth every 8 (eight) hours as needed.  . senna (SENOKOT) 8.6 MG tablet Take 2 tablets by mouth as needed for constipation.  . sodium chloride (OCEAN) 0.65 % nasal spray Place 2 sprays into the nose daily as needed.   . torsemide (DEMADEX) 20 MG tablet Take 20 mg by mouth daily.   No current facility-administered medications for this visit. (Other)      REVIEW OF SYSTEMS:    ALLERGIES Allergies  Allergen Reactions  . Nebivolol Hcl     Other reaction(s): Other (See Comments) Chest tightness,sob,cough,severe tiredness,confusion  .  Codeine Nausea Only  . Sulfa Antibiotics Nausea Only  . Sulfonamide Derivatives Nausea Only    PAST MEDICAL HISTORY Past Medical History:  Diagnosis Date  . Cancer ([place])   . CHF (congestive heart failure) (HCC)    Amyloidosis  . Diarrhea   . Diverticulosis of colon (without mention of hemorrhage)   . Edema   . Esophagitis, unspecified   . Family history of malignant neoplasm of gastrointestinal tract   . Fibrosis of lung ([place]) 03/14/2013  . Flatulence, eructation, and gas pain   . GERD (gastroesophageal reflux disease)   . Left sided ulcerative colitis ([place])   . Maxillary sinus mass   . Osteoporosis, unspecified   . Other and  unspecified hyperlipidemia   . Other and unspecified noninfectious gastroenteritis and colitis(558.9)   . Paroxysmal atrial fibrillation ([place]) 07/09/2019  . Stricture and stenosis of esophagus   . Unspecified hypothyroidism   . URI (upper respiratory infection)    Past Surgical History:  Procedure Laterality Date  . ABDOMINAL HYSTERECTOMY    . CATARACT EXTRACTION W/PHACO Bilateral    Dr. Gershon Crane  . CHOLECYSTECTOMY    . FOOT SURGERY    . THYROIDECTOMY, PARTIAL    . YAG LASER APPLICATION Right 5/80/9983   Procedure: YAG LASER APPLICATION;  Surgeon: Elta Guadeloupe T. Gershon Crane, MD;  Location: [facility];  Service: Ophthalmology;  Laterality: Right;    FAMILY HISTORY Family History  Problem Relation Age of Onset  . Colon cancer Mother   . Heart attack Father        CHF  . Stomach cancer Sister   . Diabetes Son   . Kidney cancer Son   . Adrenal disorder Son   . Heart disease Brother     SOCIAL HISTORY Social History   Tobacco Use  . Smoking status: Never Smoker  . Smokeless tobacco: Never Used  Substance Use Topics  . Alcohol use: No    Alcohol/week: 0.0 standard drinks  . Drug use: No         OPHTHALMIC EXAM:  Base Eye Exam    Visual Acuity (Snellen - Linear)      Right Left   Dist [place] 20/40 -1 CF @ 4'   Dist ph  NI 20/200       Tonometry (Tonopen, 10:43 AM)      Right Left   Pressure 13 12       Pupils      Pupils Dark Light Shape React APD   Right PERRL 4 4 Round Minimal None   Left PERRL 4 3 Round Slow None       Visual Fields (Counting fingers)      Left Right    Full Full       Neuro/Psych    Oriented x3: Yes   Mood/Affect: Normal       Dilation    Both eyes: 1.0% Mydriacyl, 2.5% Phenylephrine @ 10:43 AM        Slit Lamp and Fundus Exam    External Exam      Right Left   External Normal Normal       Slit Lamp Exam      Right Left   Lids/Lashes Normal Normal   Conjunctiva/Sclera White and quiet White and quiet   Cornea Clear Clear    Anterior Chamber Deep and quiet Deep and quiet   Iris Round and reactive Round and reactive   Lens  Posterior chamber intraocular lens   Anterior Vitreous Normal Normal  Fundus Exam      Right Left   Posterior Vitreous Posterior vitreous detachment ,,, Posterior vitreous detachment   Disc Normal Normal   C/D Ratio 0.2 0.1   Macula Hard drusen, Pigmented atrophy Atrophy, Age related macular degeneration, Early age related macular degeneration, Drusen, Macular thickening, Cystoid macular edema   Vessels Normal Normal   Periphery Normal Normal          IMAGING AND PROCEDURES  Imaging and Procedures for 05/20/20  OCT, Retina - OU - Both Eyes       Right Eye Quality was good. Scan locations included subfoveal. Central Foveal Thickness: 311. Progression has been stable. Findings include retinal drusen , no SRF, no IRF.   Left Eye Quality was good. Scan locations included subfoveal. Central Foveal Thickness: 267. Progression has improved. Findings include abnormal foveal contour, retinal drusen , cystoid macular edema.   Notes  OS, with recurrence of CNVM notable by intraretinal fluid and CME.  We'll repeat injection Avastin today and examination again in 6 weeks       Intravitreal Injection, Pharmacologic Agent - OS - Left Eye       Time Out 05/20/2020. 11:10 AM. Confirmed correct patient, procedure, site, and patient consented.   Anesthesia Topical anesthesia was used. Anesthetic medications included Akten 3.5%.   Procedure Preparation included 10% betadine to eyelids, Tobramycin 0.3%. A supplied needle was used.   Injection:  2.5 mg Bevacizumab (AVASTIN) 2.53m/0.1mL SOSY   NDC: 725852-778-24 Lot:: [id number]  Route: Intravitreal, Site: Left Eye  Post-op Post injection exam found visual acuity of at least counting fingers. The patient tolerated the procedure well. There were no complications. The patient received written and verbal post procedure care education.  Post injection medications were not given.                 ASSESSMENT/PLAN:  Exudative age-related macular degeneration of left eye with active choroidal neovascularization (HCC) The nature of wet macular degeneration was discussed with the patient.  Forms of therapy reviewed include the use of Anti-VEGF medications injected painlessly into the eye, as well as other possible treatment modalities, including thermal laser therapy. Fellow eye involvement and risks were discussed with the patient. Upon the finding of wet age related macular degeneration, treatment will be offered. The treatment regimen is on a treat as needed basis with the intent to treat if necessary and extend interval of exams when possible. On average 1 out of 6 patients do not need lifetime therapy. However, the risk of recurrent disease is high for a lifetime.  Initially monthly, then periodic, examinations and evaluations will determine whether the next treatment is required on the day of the examination.  At 367-monthnterval recurrence of CME and intraretinal fluid will need repeat injection Avastin today and follow-up in shorter interval 6 weeks  Advanced nonexudative age-related macular degeneration of both eyes without subfoveal involvement No signs of CNVM OD      ICD-10-CM   1. Exudative age-related macular degeneration of left eye with active choroidal neovascularization (HCC)  H35.3221 OCT, Retina - OU - Both Eyes    Intravitreal Injection, Pharmacologic Agent - OS - Left Eye    bevacizumab (AVASTIN) SOSY 2.5 mg  2. Advanced nonexudative age-related macular degeneration of both eyes without subfoveal involvement  H35.3133     1.  New onset visual symptoms left eye of decline in acuity.  Accompanied by intraretinal and subretinal fluid with CME.  At 3-18-monthllow-up examination interval, recurrence  of CNVM.  We'll repeat injection today and follow-up in 6 weeks  2.  Light OS only in 6 weeks likely Avastin  OS  3.  Ophthalmic Meds Ordered this visit:  Meds ordered this encounter  Medications  . bevacizumab (AVASTIN) SOSY 2.5 mg       Return in about 6 weeks (around 07/01/2020) for dilate, OS, AVASTIN OCT.  Patient Instructions  Patient instructed to contact the office for new onset visual acuity declines or distortions in either eye    Explained the diagnoses, plan, and follow up with the patient and they expressed understanding.  Patient expressed understanding of the importance of proper follow up care.   Clent Demark Andreina Outten M.D. Diseases & Surgery of the Retina and Vitreous Retina & Diabetic [place] 05/20/20     Abbreviations: M myopia (nearsighted); A astigmatism; H hyperopia (farsighted); P presbyopia; Mrx spectacle prescription;  CTL contact lenses; OD right eye; OS left eye; OU both eyes  XT exotropia; ET esotropia; PEK punctate epithelial keratitis; PEE punctate epithelial erosions; DES dry eye syndrome; MGD meibomian gland dysfunction; ATs artificial tears; PFAT's preservative free artificial tears; [place] nuclear sclerotic cataract; PSC posterior subcapsular cataract; ERM epi-retinal membrane; PVD posterior vitreous detachment; RD retinal detachment; DM diabetes mellitus; DR diabetic retinopathy; NPDR non-proliferative diabetic retinopathy; PDR proliferative diabetic retinopathy; CSME clinically significant macular edema; DME diabetic macular edema; dbh dot blot hemorrhages; CWS cotton wool spot; POAG primary open angle glaucoma; C/D cup-to-disc ratio; HVF humphrey visual field; GVF goldmann visual field; OCT optical coherence tomography; IOP intraocular pressure; BRVO Branch retinal vein occlusion; CRVO central retinal vein occlusion; CRAO central retinal artery occlusion; BRAO branch retinal artery occlusion; RT retinal tear; SB scleral buckle; PPV pars plana vitrectomy; VH Vitreous hemorrhage; PRP panretinal laser photocoagulation; IVK intravitreal kenalog; VMT vitreomacular  traction; MH Macular hole;  NVD neovascularization of the disc; NVE neovascularization elsewhere; AREDS age related eye disease study; ARMD age related macular degeneration; POAG primary open angle glaucoma; EBMD epithelial/anterior basement membrane dystrophy; ACIOL anterior chamber intraocular lens; IOL intraocular lens; PCIOL posterior chamber intraocular lens; Phaco/IOL phacoemulsification with intraocular lens placement; [place] photorefractive keratectomy; LASIK laser assisted in situ keratomileusis; HTN hypertension; DM diabetes mellitus; COPD chronic obstructive pulmonary disease

## 2020-05-20 NOTE — Assessment & Plan Note (Signed)
No signs of CNVM OD

## 2020-05-23 DIAGNOSIS — E859 Amyloidosis, unspecified: Secondary | ICD-10-CM | POA: Diagnosis not present

## 2020-05-23 DIAGNOSIS — R0902 Hypoxemia: Secondary | ICD-10-CM | POA: Diagnosis not present

## 2020-05-23 DIAGNOSIS — J704 Drug-induced interstitial lung disorders, unspecified: Secondary | ICD-10-CM | POA: Diagnosis not present

## 2020-05-30 DIAGNOSIS — Z950 Presence of cardiac pacemaker: Secondary | ICD-10-CM | POA: Diagnosis not present

## 2020-05-31 ENCOUNTER — Other Ambulatory Visit: Payer: Self-pay

## 2020-05-31 DIAGNOSIS — J849 Interstitial pulmonary disease, unspecified: Secondary | ICD-10-CM

## 2020-06-04 DIAGNOSIS — I4891 Unspecified atrial fibrillation: Secondary | ICD-10-CM | POA: Diagnosis not present

## 2020-06-04 DIAGNOSIS — Z950 Presence of cardiac pacemaker: Secondary | ICD-10-CM | POA: Diagnosis not present

## 2020-06-04 DIAGNOSIS — Z7901 Long term (current) use of anticoagulants: Secondary | ICD-10-CM | POA: Diagnosis not present

## 2020-06-04 DIAGNOSIS — I4892 Unspecified atrial flutter: Secondary | ICD-10-CM | POA: Diagnosis not present

## 2020-06-04 DIAGNOSIS — Z882 Allergy status to sulfonamides status: Secondary | ICD-10-CM | POA: Diagnosis not present

## 2020-06-04 DIAGNOSIS — Z79890 Hormone replacement therapy (postmenopausal): Secondary | ICD-10-CM | POA: Diagnosis not present

## 2020-06-04 DIAGNOSIS — Z87440 Personal history of urinary (tract) infections: Secondary | ICD-10-CM | POA: Diagnosis not present

## 2020-06-04 DIAGNOSIS — E8581 Light chain (AL) amyloidosis: Secondary | ICD-10-CM | POA: Diagnosis not present

## 2020-06-04 DIAGNOSIS — E854 Organ-limited amyloidosis: Secondary | ICD-10-CM | POA: Diagnosis not present

## 2020-06-04 DIAGNOSIS — Z885 Allergy status to narcotic agent status: Secondary | ICD-10-CM | POA: Diagnosis not present

## 2020-06-04 DIAGNOSIS — I2699 Other pulmonary embolism without acute cor pulmonale: Secondary | ICD-10-CM | POA: Diagnosis not present

## 2020-06-04 DIAGNOSIS — I43 Cardiomyopathy in diseases classified elsewhere: Secondary | ICD-10-CM | POA: Diagnosis not present

## 2020-06-04 DIAGNOSIS — H3530 Unspecified macular degeneration: Secondary | ICD-10-CM | POA: Diagnosis not present

## 2020-06-04 DIAGNOSIS — I509 Heart failure, unspecified: Secondary | ICD-10-CM | POA: Diagnosis not present

## 2020-06-07 DIAGNOSIS — I071 Rheumatic tricuspid insufficiency: Secondary | ICD-10-CM | POA: Diagnosis not present

## 2020-06-07 DIAGNOSIS — I340 Nonrheumatic mitral (valve) insufficiency: Secondary | ICD-10-CM | POA: Diagnosis not present

## 2020-06-07 DIAGNOSIS — I480 Paroxysmal atrial fibrillation: Secondary | ICD-10-CM | POA: Diagnosis not present

## 2020-06-07 DIAGNOSIS — E8581 Light chain (AL) amyloidosis: Secondary | ICD-10-CM | POA: Diagnosis not present

## 2020-06-07 DIAGNOSIS — I43 Cardiomyopathy in diseases classified elsewhere: Secondary | ICD-10-CM | POA: Diagnosis not present

## 2020-06-07 DIAGNOSIS — E854 Organ-limited amyloidosis: Secondary | ICD-10-CM | POA: Diagnosis not present

## 2020-06-07 DIAGNOSIS — I5032 Chronic diastolic (congestive) heart failure: Secondary | ICD-10-CM | POA: Diagnosis not present

## 2020-06-07 DIAGNOSIS — J690 Pneumonitis due to inhalation of food and vomit: Secondary | ICD-10-CM | POA: Diagnosis not present

## 2020-06-17 DIAGNOSIS — J704 Drug-induced interstitial lung disorders, unspecified: Secondary | ICD-10-CM | POA: Diagnosis not present

## 2020-06-17 DIAGNOSIS — K219 Gastro-esophageal reflux disease without esophagitis: Secondary | ICD-10-CM | POA: Diagnosis not present

## 2020-06-17 DIAGNOSIS — R7303 Prediabetes: Secondary | ICD-10-CM | POA: Diagnosis not present

## 2020-06-17 DIAGNOSIS — E785 Hyperlipidemia, unspecified: Secondary | ICD-10-CM | POA: Diagnosis not present

## 2020-06-17 DIAGNOSIS — E039 Hypothyroidism, unspecified: Secondary | ICD-10-CM | POA: Diagnosis not present

## 2020-06-25 DIAGNOSIS — E039 Hypothyroidism, unspecified: Secondary | ICD-10-CM | POA: Diagnosis not present

## 2020-06-25 DIAGNOSIS — R300 Dysuria: Secondary | ICD-10-CM | POA: Diagnosis not present

## 2020-06-25 DIAGNOSIS — I509 Heart failure, unspecified: Secondary | ICD-10-CM | POA: Diagnosis not present

## 2020-06-25 DIAGNOSIS — G894 Chronic pain syndrome: Secondary | ICD-10-CM | POA: Diagnosis not present

## 2020-06-25 DIAGNOSIS — N390 Urinary tract infection, site not specified: Secondary | ICD-10-CM | POA: Diagnosis not present

## 2020-06-25 DIAGNOSIS — J99 Respiratory disorders in diseases classified elsewhere: Secondary | ICD-10-CM | POA: Diagnosis not present

## 2020-06-25 DIAGNOSIS — Z0189 Encounter for other specified special examinations: Secondary | ICD-10-CM | POA: Diagnosis not present

## 2020-06-25 DIAGNOSIS — E859 Amyloidosis, unspecified: Secondary | ICD-10-CM | POA: Diagnosis not present

## 2020-06-25 DIAGNOSIS — I43 Cardiomyopathy in diseases classified elsewhere: Secondary | ICD-10-CM | POA: Diagnosis not present

## 2020-06-25 DIAGNOSIS — I4891 Unspecified atrial fibrillation: Secondary | ICD-10-CM | POA: Diagnosis not present

## 2020-06-25 DIAGNOSIS — I340 Nonrheumatic mitral (valve) insufficiency: Secondary | ICD-10-CM | POA: Diagnosis not present

## 2020-06-25 DIAGNOSIS — K219 Gastro-esophageal reflux disease without esophagitis: Secondary | ICD-10-CM | POA: Diagnosis not present

## 2020-06-25 DIAGNOSIS — R945 Abnormal results of liver function studies: Secondary | ICD-10-CM | POA: Diagnosis not present

## 2020-07-01 ENCOUNTER — Encounter (INDEPENDENT_AMBULATORY_CARE_PROVIDER_SITE_OTHER): Payer: Self-pay | Admitting: Ophthalmology

## 2020-07-01 ENCOUNTER — Ambulatory Visit (INDEPENDENT_AMBULATORY_CARE_PROVIDER_SITE_OTHER): Payer: Medicare Other | Admitting: Ophthalmology

## 2020-07-01 ENCOUNTER — Other Ambulatory Visit: Payer: Self-pay

## 2020-07-01 DIAGNOSIS — H353221 Exudative age-related macular degeneration, left eye, with active choroidal neovascularization: Secondary | ICD-10-CM | POA: Diagnosis not present

## 2020-07-01 MED ORDER — BEVACIZUMAB 2.5 MG/0.1ML IZ SOSY
2.5000 mg | PREFILLED_SYRINGE | INTRAVITREAL | Status: AC | PRN
  Administered 2020-07-01: 2.5 mg via INTRAVITREAL

## 2020-07-01 NOTE — Progress Notes (Signed)
07/01/2020     CHIEF COMPLAINT Patient presents for Retina Follow Up (6 Week Wet AMD f\u OS. Possible Avastin OS. OCT/Pt states OS vision is not doing well. Pt states she is having to use gls for reading.)   HISTORY OF PRESENT ILLNESS: Cindy [name] is a 87 y.o. female who presents to the clinic today for:   HPI    Retina Follow Up    Patient presents with  Wet AMD.  In left eye.  Severity is moderate.  Duration of 6 weeks.  Since onset it is stable.  I, the attending physician,  performed the HPI with the patient and updated documentation appropriately. Additional comments: 6 Week Wet AMD f\u OS. Possible Avastin OS. OCT Pt states OS vision is not doing well. Pt states she is having to use gls for reading.       Last edited by Tilda Franco on 07/01/2020 10:28 AM. (History)      Referring physician: Celene Squibb, MD [place],  [place] [address]  HISTORICAL INFORMATION:   Selected notes from the MEDICAL RECORD NUMBER    Lab Results  Component Value Date   HGBA1C 6.1 (H) 03/14/2013     CURRENT MEDICATIONS: Current Outpatient Medications (Ophthalmic Drugs)  Medication Sig  . Polyethyl Glycol-Propyl Glycol (SYSTANE OP) Apply 1 drop to eye daily.   No current facility-administered medications for this visit. (Ophthalmic Drugs)   Current Outpatient Medications (Other)  Medication Sig  . acetaminophen (TYLENOL) 500 MG tablet Take 1,000 mg by mouth as needed.  Marland Kitchen albuterol (PROVENTIL) (2.5 MG/3ML) 0.083% nebulizer solution Take 3 mLs by nebulization 4 (four) times daily as needed.  . ALPRAZolam (XANAX) 0.5 MG tablet Take 1 tablet (0.5 mg total) by mouth at bedtime as needed for anxiety.  Marland Kitchen apixaban (ELIQUIS) 5 MG TABS tablet Take 2 tablets by mouth twice a day for 7 days; and after that 1 tablet twice a day on daily basis.  . calcium carbonate (TUMS - DOSED IN MG ELEMENTAL CALCIUM) 500 MG chewable tablet Chew 1 tablet by mouth daily.  . cholecalciferol  (VITAMIN D) 1000 UNITS tablet Take 1,000 Units by mouth daily.    . CYANOCOBALAMIN PO Take 1 tablet by mouth daily.  Marland Kitchen dextromethorphan 15 MG/5ML syrup Take 5 mLs by mouth 2 (two) times daily.  . fentaNYL (DURAGESIC) 25 MCG/HR Place 1 patch onto the skin every 3 (three) days.  Marland Kitchen levothyroxine (SYNTHROID) 100 MCG tablet Take 100 mcg by mouth every morning.  . metoprolol succinate (TOPROL-XL) 25 MG 24 hr tablet Take 0.5-1 tablets (12.5-25 mg total) by mouth in the morning and at bedtime. Patient takes 1 tablet (58m) in the morning and 1/2 of a tablet (12.54m in the evening  . metoprolol tartrate (LOPRESSOR) 25 MG tablet Take 25 mg by mouth.  . Marland Kitchenmeprazole (PRILOSEC) 20 MG capsule Take 20 mg by mouth daily.  . ondansetron (ZOFRAN) 4 MG tablet Take 4 mg by mouth every 8 (eight) hours as needed.  . senna (SENOKOT) 8.6 MG tablet Take 2 tablets by mouth as needed for constipation.  . sodium chloride (OCEAN) 0.65 % nasal spray Place 2 sprays into the nose daily as needed.   . torsemide (DEMADEX) 20 MG tablet Take 20 mg by mouth daily.   No current facility-administered medications for this visit. (Other)      REVIEW OF SYSTEMS:    ALLERGIES Allergies  Allergen Reactions  . Nebivolol Hcl  Other reaction(s): Other (See Comments) Chest tightness,sob,cough,severe tiredness,confusion  . Codeine Nausea Only  . Sulfa Antibiotics Nausea Only  . Sulfonamide Derivatives Nausea Only    PAST MEDICAL HISTORY Past Medical History:  Diagnosis Date  . Cancer ([place])   . CHF (congestive heart failure) (HCC)    Amyloidosis  . Diarrhea   . Diverticulosis of colon (without mention of hemorrhage)   . Edema   . Esophagitis, unspecified   . Family history of malignant neoplasm of gastrointestinal tract   . Fibrosis of lung ([place]) 03/14/2013  . Flatulence, eructation, and gas pain   . GERD (gastroesophageal reflux disease)   . Left sided ulcerative colitis ([place])   . Maxillary sinus mass   .  Osteoporosis, unspecified   . Other and unspecified hyperlipidemia   . Other and unspecified noninfectious gastroenteritis and colitis(558.9)   . Paroxysmal atrial fibrillation ([place]) 07/09/2019  . Stricture and stenosis of esophagus   . Unspecified hypothyroidism   . URI (upper respiratory infection)    Past Surgical History:  Procedure Laterality Date  . ABDOMINAL HYSTERECTOMY    . CATARACT EXTRACTION W/PHACO Bilateral    Dr. Gershon Crane  . CHOLECYSTECTOMY    . FOOT SURGERY    . THYROIDECTOMY, PARTIAL    . YAG LASER APPLICATION Right 10/11/7626   Procedure: YAG LASER APPLICATION;  Surgeon: Elta Guadeloupe T. Gershon Crane, MD;  Location: [facility];  Service: Ophthalmology;  Laterality: Right;    FAMILY HISTORY Family History  Problem Relation Age of Onset  . Colon cancer Mother   . Heart attack Father        CHF  . Stomach cancer Sister   . Diabetes Son   . Kidney cancer Son   . Adrenal disorder Son   . Heart disease Brother     SOCIAL HISTORY Social History   Tobacco Use  . Smoking status: Never Smoker  . Smokeless tobacco: Never Used  Substance Use Topics  . Alcohol use: No    Alcohol/week: 0.0 standard drinks  . Drug use: No         OPHTHALMIC EXAM:  Base Eye Exam    Visual Acuity (Snellen - Linear)      Right Left   Dist [place] 20/40 -2 CF @ 5'   Dist ph [place] NI NI       Tonometry (Tonopen, 10:34 AM)      Right Left   Pressure 12 11       Pupils      Pupils Dark Light Shape React APD   Right PERRL 4 4 Round Minimal None   Left PERRL 4 4 Round Minimal None       Visual Fields (Counting fingers)      Left Right    Full Full       Neuro/Psych    Oriented x3: Yes   Mood/Affect: Normal       Dilation    Left eye: 1.0% Mydriacyl, 2.5% Phenylephrine @ 10:34 AM        Slit Lamp and Fundus Exam    External Exam      Right Left   External Normal Normal       Slit Lamp Exam      Right Left   Lids/Lashes Normal Normal   Conjunctiva/Sclera White and quiet White and  quiet   Cornea Clear Clear   Anterior Chamber Deep and quiet Deep and quiet   Iris Round and reactive Round and reactive   Lens  Posterior  chamber intraocular lens   Anterior Vitreous Normal Normal       Fundus Exam      Right Left   Posterior Vitreous  ,,, Posterior vitreous detachment   Disc  Normal   C/D Ratio  0.1   Macula  Atrophy, Age related macular degeneration, Early age related macular degeneration, Drusen, Macular thickening, Cystoid macular edema   Vessels  Normal   Periphery  Normal          IMAGING AND PROCEDURES  Imaging and Procedures for 07/01/20  OCT, Retina - OU - Both Eyes       Right Eye Quality was good. Scan locations included subfoveal. Central Foveal Thickness: 323. Progression has been stable. Findings include retinal drusen , no SRF, no IRF.   Left Eye Quality was good. Scan locations included subfoveal. Central Foveal Thickness: 277. Progression has improved. Findings include abnormal foveal contour, retinal drusen , cystoid macular edema, subretinal hyper-reflective material, no SRF, no IRF.   Notes  OS, with recurrence of CNVM notable by intraretinal fluid and CME delineated on January 2022.  Vastly improved macular anatomy left eye with less CME and intraretinal fluid and less subretinal material we'll repeat injection Avastin today and examination again in 6 weeks       Intravitreal Injection, Pharmacologic Agent - OS - Left Eye       Time Out 07/01/2020. 11:08 AM. Confirmed correct patient, procedure, site, and patient consented.   Anesthesia Topical anesthesia was used. Anesthetic medications included Akten 3.5%.   Procedure Preparation included 10% betadine to eyelids, Tobramycin 0.3%, 5% betadine to ocular surface. A supplied needle was used.   Injection:  2.5 mg Bevacizumab (AVASTIN) 2.68m/0.1mL SOSY   NDC:: 02542-706-23 Lot:: [id number]  Route: Intravitreal, Site: Left Eye  Post-op Post injection exam found visual acuity of  at least counting fingers. The patient tolerated the procedure well. There were no complications. The patient received written and verbal post procedure care education. Post injection medications were not given.                 ASSESSMENT/PLAN:  Exudative age-related macular degeneration of left eye with active choroidal neovascularization (HCC) Much improved anatomy left eye some 6 weeks post her most recent injection January 2022, Which had significant recurrence of CNVM  And subretinal fluid previous from previous 472-monthnterval.        ICD-10-CM   1. Exudative age-related macular degeneration of left eye with active choroidal neovascularization (HCC)  H35.3221 OCT, Retina - OU - Both Eyes    Intravitreal Injection, Pharmacologic Agent - OS - Left Eye    bevacizumab (AVASTIN) SOSY 2.5 mg    1.  2.  3.  Ophthalmic Meds Ordered this visit:  Meds ordered this encounter  Medications  . bevacizumab (AVASTIN) SOSY 2.5 mg       Return in about 6 weeks (around 08/12/2020) for dilate, OS, AVASTIN OCT.  There are no Patient Instructions on file for this visit.   Explained the diagnoses, plan, and follow up with the patient and they expressed understanding.  Patient expressed understanding of the importance of proper follow up care.   GaClent Demarkankin M.D. Diseases & Surgery of the Retina and Vitreous Retina & Diabetic [place]3/14/22     Abbreviations: M myopia (nearsighted); A astigmatism; H hyperopia (farsighted); P presbyopia; Mrx spectacle prescription;  CTL contact lenses; OD right eye; OS left eye; OU both eyes  XT exotropia; ET esotropia; PEK punctate  epithelial keratitis; PEE punctate epithelial erosions; DES dry eye syndrome; MGD meibomian gland dysfunction; ATs artificial tears; PFAT's preservative free artificial tears; [place] nuclear sclerotic cataract; PSC posterior subcapsular cataract; ERM epi-retinal membrane; PVD posterior vitreous detachment; RD retinal  detachment; DM diabetes mellitus; DR diabetic retinopathy; NPDR non-proliferative diabetic retinopathy; PDR proliferative diabetic retinopathy; CSME clinically significant macular edema; DME diabetic macular edema; dbh dot blot hemorrhages; CWS cotton wool spot; POAG primary open angle glaucoma; C/D cup-to-disc ratio; HVF humphrey visual field; GVF goldmann visual field; OCT optical coherence tomography; IOP intraocular pressure; BRVO Branch retinal vein occlusion; CRVO central retinal vein occlusion; CRAO central retinal artery occlusion; BRAO branch retinal artery occlusion; RT retinal tear; SB scleral buckle; PPV pars plana vitrectomy; VH Vitreous hemorrhage; PRP panretinal laser photocoagulation; IVK intravitreal kenalog; VMT vitreomacular traction; MH Macular hole;  NVD neovascularization of the disc; NVE neovascularization elsewhere; AREDS age related eye disease study; ARMD age related macular degeneration; POAG primary open angle glaucoma; EBMD epithelial/anterior basement membrane dystrophy; ACIOL anterior chamber intraocular lens; IOL intraocular lens; PCIOL posterior chamber intraocular lens; Phaco/IOL phacoemulsification with intraocular lens placement; [place] photorefractive keratectomy; LASIK laser assisted in situ keratomileusis; HTN hypertension; DM diabetes mellitus; COPD chronic obstructive pulmonary disease

## 2020-07-01 NOTE — Assessment & Plan Note (Signed)
Much improved anatomy left eye some 6 weeks post her most recent injection January 2022, Which had significant recurrence of CNVM  And subretinal fluid previous from previous 22-monthinterval.

## 2020-07-17 DIAGNOSIS — E1165 Type 2 diabetes mellitus with hyperglycemia: Secondary | ICD-10-CM | POA: Diagnosis not present

## 2020-07-17 DIAGNOSIS — I10 Essential (primary) hypertension: Secondary | ICD-10-CM | POA: Diagnosis not present

## 2020-07-24 DIAGNOSIS — K219 Gastro-esophageal reflux disease without esophagitis: Secondary | ICD-10-CM | POA: Diagnosis not present

## 2020-08-01 DIAGNOSIS — Z9981 Dependence on supplemental oxygen: Secondary | ICD-10-CM | POA: Diagnosis not present

## 2020-08-01 DIAGNOSIS — I2699 Other pulmonary embolism without acute cor pulmonale: Secondary | ICD-10-CM | POA: Diagnosis not present

## 2020-08-06 DIAGNOSIS — L820 Inflamed seborrheic keratosis: Secondary | ICD-10-CM | POA: Diagnosis not present

## 2020-08-08 DIAGNOSIS — E859 Amyloidosis, unspecified: Secondary | ICD-10-CM | POA: Diagnosis not present

## 2020-08-08 DIAGNOSIS — G894 Chronic pain syndrome: Secondary | ICD-10-CM | POA: Diagnosis not present

## 2020-08-08 DIAGNOSIS — Z0189 Encounter for other specified special examinations: Secondary | ICD-10-CM | POA: Diagnosis not present

## 2020-08-08 DIAGNOSIS — I340 Nonrheumatic mitral (valve) insufficiency: Secondary | ICD-10-CM | POA: Diagnosis not present

## 2020-08-08 DIAGNOSIS — K219 Gastro-esophageal reflux disease without esophagitis: Secondary | ICD-10-CM | POA: Diagnosis not present

## 2020-08-08 DIAGNOSIS — N390 Urinary tract infection, site not specified: Secondary | ICD-10-CM | POA: Diagnosis not present

## 2020-08-08 DIAGNOSIS — I4891 Unspecified atrial fibrillation: Secondary | ICD-10-CM | POA: Diagnosis not present

## 2020-08-08 DIAGNOSIS — R945 Abnormal results of liver function studies: Secondary | ICD-10-CM | POA: Diagnosis not present

## 2020-08-08 DIAGNOSIS — J99 Respiratory disorders in diseases classified elsewhere: Secondary | ICD-10-CM | POA: Diagnosis not present

## 2020-08-08 DIAGNOSIS — I2699 Other pulmonary embolism without acute cor pulmonale: Secondary | ICD-10-CM | POA: Diagnosis not present

## 2020-08-08 DIAGNOSIS — E039 Hypothyroidism, unspecified: Secondary | ICD-10-CM | POA: Diagnosis not present

## 2020-08-08 DIAGNOSIS — R300 Dysuria: Secondary | ICD-10-CM | POA: Diagnosis not present

## 2020-08-08 DIAGNOSIS — I43 Cardiomyopathy in diseases classified elsewhere: Secondary | ICD-10-CM | POA: Diagnosis not present

## 2020-08-08 DIAGNOSIS — I509 Heart failure, unspecified: Secondary | ICD-10-CM | POA: Diagnosis not present

## 2020-08-15 ENCOUNTER — Inpatient Hospital Stay (HOSPITAL_COMMUNITY)
Admission: EM | Admit: 2020-08-15 | Discharge: 2020-08-19 | DRG: 175 | Disposition: A | Discharge status: Home Health | Payer: Medicare Other | Attending: Family Medicine | Admitting: Family Medicine

## 2020-08-15 ENCOUNTER — Other Ambulatory Visit: Payer: Self-pay

## 2020-08-15 ENCOUNTER — Encounter (HOSPITAL_COMMUNITY): Payer: Self-pay

## 2020-08-15 DIAGNOSIS — E785 Hyperlipidemia, unspecified: Secondary | ICD-10-CM | POA: Diagnosis present

## 2020-08-15 DIAGNOSIS — J9601 Acute respiratory failure with hypoxia: Secondary | ICD-10-CM | POA: Diagnosis not present

## 2020-08-15 DIAGNOSIS — Z79899 Other long term (current) drug therapy: Secondary | ICD-10-CM

## 2020-08-15 DIAGNOSIS — Z8249 Family history of ischemic heart disease and other diseases of the circulatory system: Secondary | ICD-10-CM

## 2020-08-15 DIAGNOSIS — I959 Hypotension, unspecified: Secondary | ICD-10-CM | POA: Diagnosis present

## 2020-08-15 DIAGNOSIS — Z885 Allergy status to narcotic agent status: Secondary | ICD-10-CM

## 2020-08-15 DIAGNOSIS — Z888 Allergy status to other drugs, medicaments and biological substances status: Secondary | ICD-10-CM

## 2020-08-15 DIAGNOSIS — I480 Paroxysmal atrial fibrillation: Secondary | ICD-10-CM

## 2020-08-15 DIAGNOSIS — R531 Weakness: Secondary | ICD-10-CM | POA: Diagnosis not present

## 2020-08-15 DIAGNOSIS — J90 Pleural effusion, not elsewhere classified: Secondary | ICD-10-CM | POA: Diagnosis not present

## 2020-08-15 DIAGNOSIS — Z9049 Acquired absence of other specified parts of digestive tract: Secondary | ICD-10-CM

## 2020-08-15 DIAGNOSIS — Z86711 Personal history of pulmonary embolism: Secondary | ICD-10-CM

## 2020-08-15 DIAGNOSIS — K219 Gastro-esophageal reflux disease without esophagitis: Secondary | ICD-10-CM | POA: Diagnosis present

## 2020-08-15 DIAGNOSIS — I5032 Chronic diastolic (congestive) heart failure: Secondary | ICD-10-CM | POA: Diagnosis present

## 2020-08-15 DIAGNOSIS — Z882 Allergy status to sulfonamides status: Secondary | ICD-10-CM

## 2020-08-15 DIAGNOSIS — E039 Hypothyroidism, unspecified: Secondary | ICD-10-CM | POA: Diagnosis present

## 2020-08-15 DIAGNOSIS — I43 Cardiomyopathy in diseases classified elsewhere: Secondary | ICD-10-CM | POA: Diagnosis not present

## 2020-08-15 DIAGNOSIS — E854 Organ-limited amyloidosis: Secondary | ICD-10-CM

## 2020-08-15 DIAGNOSIS — I2699 Other pulmonary embolism without acute cor pulmonale: Principal | ICD-10-CM

## 2020-08-15 DIAGNOSIS — M810 Age-related osteoporosis without current pathological fracture: Secondary | ICD-10-CM | POA: Diagnosis present

## 2020-08-15 DIAGNOSIS — N390 Urinary tract infection, site not specified: Secondary | ICD-10-CM | POA: Diagnosis present

## 2020-08-15 DIAGNOSIS — Z79890 Hormone replacement therapy (postmenopausal): Secondary | ICD-10-CM

## 2020-08-15 DIAGNOSIS — Z90710 Acquired absence of both cervix and uterus: Secondary | ICD-10-CM

## 2020-08-15 DIAGNOSIS — R0600 Dyspnea, unspecified: Secondary | ICD-10-CM | POA: Diagnosis not present

## 2020-08-15 DIAGNOSIS — Z20822 Contact with and (suspected) exposure to covid-19: Secondary | ICD-10-CM | POA: Diagnosis not present

## 2020-08-15 DIAGNOSIS — I517 Cardiomegaly: Secondary | ICD-10-CM | POA: Diagnosis not present

## 2020-08-15 DIAGNOSIS — J8410 Pulmonary fibrosis, unspecified: Secondary | ICD-10-CM | POA: Diagnosis present

## 2020-08-15 DIAGNOSIS — E8581 Light chain (AL) amyloidosis: Secondary | ICD-10-CM | POA: Diagnosis not present

## 2020-08-15 DIAGNOSIS — Z86718 Personal history of other venous thrombosis and embolism: Secondary | ICD-10-CM

## 2020-08-15 DIAGNOSIS — Z7901 Long term (current) use of anticoagulants: Secondary | ICD-10-CM

## 2020-08-15 DIAGNOSIS — E890 Postprocedural hypothyroidism: Secondary | ICD-10-CM | POA: Diagnosis present

## 2020-08-15 NOTE — ED Triage Notes (Signed)
Pt to er, pt states that she has a hx of bone cancer, states that she also has a pe that she is currently being treated for, pt to er for some swelling in her L lower leg and also because she has been getting progressively weaker.  States that she normally lays down every day, but today after checking her vitals at the drug store and finding them all low she got home and had increased fatigue.

## 2020-08-15 NOTE — ED Notes (Signed)
Pt placed on 2L O2 via [place], pt satting 96% on 2L.

## 2020-08-16 ENCOUNTER — Inpatient Hospital Stay (HOSPITAL_COMMUNITY): Payer: Medicare Other

## 2020-08-16 ENCOUNTER — Emergency Department (HOSPITAL_COMMUNITY): Payer: Medicare Other

## 2020-08-16 DIAGNOSIS — M79605 Pain in left leg: Secondary | ICD-10-CM | POA: Diagnosis not present

## 2020-08-16 DIAGNOSIS — R600 Localized edema: Secondary | ICD-10-CM | POA: Diagnosis not present

## 2020-08-16 DIAGNOSIS — Z8249 Family history of ischemic heart disease and other diseases of the circulatory system: Secondary | ICD-10-CM | POA: Diagnosis not present

## 2020-08-16 DIAGNOSIS — Z885 Allergy status to narcotic agent status: Secondary | ICD-10-CM | POA: Diagnosis not present

## 2020-08-16 DIAGNOSIS — E8581 Light chain (AL) amyloidosis: Secondary | ICD-10-CM | POA: Diagnosis present

## 2020-08-16 DIAGNOSIS — I959 Hypotension, unspecified: Secondary | ICD-10-CM | POA: Diagnosis present

## 2020-08-16 DIAGNOSIS — Z86711 Personal history of pulmonary embolism: Secondary | ICD-10-CM | POA: Diagnosis not present

## 2020-08-16 DIAGNOSIS — J8410 Pulmonary fibrosis, unspecified: Secondary | ICD-10-CM | POA: Diagnosis present

## 2020-08-16 DIAGNOSIS — N390 Urinary tract infection, site not specified: Secondary | ICD-10-CM | POA: Diagnosis present

## 2020-08-16 DIAGNOSIS — Z20822 Contact with and (suspected) exposure to covid-19: Secondary | ICD-10-CM | POA: Diagnosis present

## 2020-08-16 DIAGNOSIS — I480 Paroxysmal atrial fibrillation: Secondary | ICD-10-CM | POA: Diagnosis not present

## 2020-08-16 DIAGNOSIS — R0609 Other forms of dyspnea: Secondary | ICD-10-CM | POA: Diagnosis not present

## 2020-08-16 DIAGNOSIS — Z79890 Hormone replacement therapy (postmenopausal): Secondary | ICD-10-CM | POA: Diagnosis not present

## 2020-08-16 DIAGNOSIS — E039 Hypothyroidism, unspecified: Secondary | ICD-10-CM

## 2020-08-16 DIAGNOSIS — R0600 Dyspnea, unspecified: Secondary | ICD-10-CM | POA: Diagnosis not present

## 2020-08-16 DIAGNOSIS — I2699 Other pulmonary embolism without acute cor pulmonale: Secondary | ICD-10-CM | POA: Diagnosis not present

## 2020-08-16 DIAGNOSIS — Z90710 Acquired absence of both cervix and uterus: Secondary | ICD-10-CM | POA: Diagnosis not present

## 2020-08-16 DIAGNOSIS — M810 Age-related osteoporosis without current pathological fracture: Secondary | ICD-10-CM | POA: Diagnosis present

## 2020-08-16 DIAGNOSIS — Z888 Allergy status to other drugs, medicaments and biological substances status: Secondary | ICD-10-CM | POA: Diagnosis not present

## 2020-08-16 DIAGNOSIS — Z79899 Other long term (current) drug therapy: Secondary | ICD-10-CM | POA: Diagnosis not present

## 2020-08-16 DIAGNOSIS — J9601 Acute respiratory failure with hypoxia: Secondary | ICD-10-CM | POA: Diagnosis present

## 2020-08-16 DIAGNOSIS — E785 Hyperlipidemia, unspecified: Secondary | ICD-10-CM | POA: Diagnosis present

## 2020-08-16 DIAGNOSIS — I43 Cardiomyopathy in diseases classified elsewhere: Secondary | ICD-10-CM | POA: Diagnosis not present

## 2020-08-16 DIAGNOSIS — E782 Mixed hyperlipidemia: Secondary | ICD-10-CM | POA: Diagnosis not present

## 2020-08-16 DIAGNOSIS — J90 Pleural effusion, not elsewhere classified: Secondary | ICD-10-CM | POA: Diagnosis not present

## 2020-08-16 DIAGNOSIS — K219 Gastro-esophageal reflux disease without esophagitis: Secondary | ICD-10-CM | POA: Diagnosis present

## 2020-08-16 DIAGNOSIS — I517 Cardiomegaly: Secondary | ICD-10-CM | POA: Diagnosis not present

## 2020-08-16 DIAGNOSIS — Z7901 Long term (current) use of anticoagulants: Secondary | ICD-10-CM | POA: Diagnosis not present

## 2020-08-16 DIAGNOSIS — E854 Organ-limited amyloidosis: Secondary | ICD-10-CM | POA: Diagnosis not present

## 2020-08-16 DIAGNOSIS — E890 Postprocedural hypothyroidism: Secondary | ICD-10-CM | POA: Diagnosis present

## 2020-08-16 DIAGNOSIS — R531 Weakness: Secondary | ICD-10-CM | POA: Diagnosis not present

## 2020-08-16 DIAGNOSIS — Z9049 Acquired absence of other specified parts of digestive tract: Secondary | ICD-10-CM | POA: Diagnosis not present

## 2020-08-16 DIAGNOSIS — I5032 Chronic diastolic (congestive) heart failure: Secondary | ICD-10-CM | POA: Diagnosis not present

## 2020-08-16 LAB — PROTIME-INR
INR: 1.4 — ABNORMAL HIGH (ref 0.8–1.2)
Prothrombin Time: 17.2 seconds — ABNORMAL HIGH (ref 11.4–15.2)

## 2020-08-16 LAB — COMPREHENSIVE METABOLIC PANEL
ALT: 12 U/L (ref 0–44)
AST: 22 U/L (ref 15–41)
Albumin: 3.3 g/dL — ABNORMAL LOW (ref 3.5–5.0)
Alkaline Phosphatase: 88 U/L (ref 38–126)
Anion gap: 9 (ref 5–15)
BUN: 23 mg/dL (ref 8–23)
CO2: 26 mmol/L (ref 22–32)
Calcium: 8.4 mg/dL — ABNORMAL LOW (ref 8.9–10.3)
Chloride: 97 mmol/L — ABNORMAL LOW (ref 98–111)
Creatinine, Ser: 0.87 mg/dL (ref 0.44–1.00)
GFR, Estimated: >60 mL/min (ref >60)
Glucose, Bld: 136 mg/dL — ABNORMAL HIGH (ref 70–99)
Potassium: 3.9 mmol/L (ref 3.5–5.1)
Sodium: 132 mmol/L — ABNORMAL LOW (ref 135–145)
Total Bilirubin: 1.1 mg/dL (ref 0.3–1.2)
Total Protein: 6.1 g/dL — ABNORMAL LOW (ref 6.5–8.1)

## 2020-08-16 LAB — CBC WITH DIFFERENTIAL/PLATELET
Abs Immature Granulocytes: 0.03 10*3/uL (ref 0.00–0.07)
Basophils Absolute: 0.1 10*3/uL (ref 0.0–0.1)
Basophils Relative: 1 %
Eosinophils Absolute: 0 10*3/uL (ref 0.0–0.5)
Eosinophils Relative: 0 %
HCT: 37.8 % (ref 36.0–46.0)
Hemoglobin: 12.3 g/dL (ref 12.0–15.0)
Immature Granulocytes: 0 %
Lymphocytes Relative: 9 %
Lymphs Abs: 1.1 10*3/uL (ref 0.7–4.0)
MCH: 31.6 pg (ref 26.0–34.0)
MCHC: 32.5 g/dL (ref 30.0–36.0)
MCV: 97.2 fL (ref 80.0–100.0)
Monocytes Absolute: 0.5 10*3/uL (ref 0.1–1.0)
Monocytes Relative: 4 %
Neutro Abs: 11.3 10*3/uL — ABNORMAL HIGH (ref 1.7–7.7)
Neutrophils Relative %: 86 %
Platelets: 178 10*3/uL (ref 150–400)
RBC: 3.89 MIL/uL (ref 3.87–5.11)
RDW: 14 % (ref 11.5–15.5)
WBC: 13 10*3/uL — ABNORMAL HIGH (ref 4.0–10.5)
nRBC: 0 % (ref 0.0–0.2)

## 2020-08-16 LAB — TROPONIN I (HIGH SENSITIVITY)
Troponin I (High Sensitivity): 7 ng/L (ref <18)
Troponin I (High Sensitivity): 2 ng/L (ref <18)

## 2020-08-16 LAB — ECHOCARDIOGRAM COMPLETE
Area-P 1/2: 3.65 cm2
Height: 68 in
S' Lateral: 2.12 cm
Weight: 2042.34 oz

## 2020-08-16 LAB — URINALYSIS, ROUTINE W REFLEX MICROSCOPIC
Bacteria, UA: NONE SEEN
Bilirubin Urine: NEGATIVE
Glucose, UA: NEGATIVE mg/dL
Hgb urine dipstick: NEGATIVE
Ketones, ur: NEGATIVE mg/dL
Nitrite: NEGATIVE
Protein, ur: NEGATIVE mg/dL
Specific Gravity, Urine: 1.04 — ABNORMAL HIGH (ref 1.005–1.030)
pH: 6 (ref 5.0–8.0)

## 2020-08-16 LAB — APTT: aPTT: 43 seconds — ABNORMAL HIGH (ref 24–36)

## 2020-08-16 LAB — BRAIN NATRIURETIC PEPTIDE: B Natriuretic Peptide: 161 pg/mL — ABNORMAL HIGH (ref 0.0–100.0)

## 2020-08-16 LAB — SARS CORONAVIRUS 2 (TAT 6-24 HRS): SARS Coronavirus 2: NEGATIVE

## 2020-08-16 LAB — HEPARIN LEVEL (UNFRACTIONATED): Heparin Unfractionated: >1.1 IU/mL — ABNORMAL HIGH (ref 0.30–0.70)

## 2020-08-16 MED ORDER — WARFARIN SODIUM 2 MG PO TABS
4.0000 mg | ORAL_TABLET | Freq: Once | ORAL | Status: AC
  Administered 2020-08-16: 4 mg via ORAL
  Filled 2020-08-16: qty 2

## 2020-08-16 MED ORDER — ALPRAZOLAM 0.5 MG PO TABS
0.5000 mg | ORAL_TABLET | Freq: Every evening | ORAL | Status: DC | PRN
  Administered 2020-08-19: 0.5 mg via ORAL
  Filled 2020-08-16 (×2): qty 1

## 2020-08-16 MED ORDER — WARFARIN - PHARMACIST DOSING INPATIENT: Freq: Every day | Status: DC

## 2020-08-16 MED ORDER — IOHEXOL 350 MG/ML SOLN
100.0000 mL | Freq: Once | INTRAVENOUS | Status: AC | PRN
  Administered 2020-08-16: 100 mL via INTRAVENOUS

## 2020-08-16 MED ORDER — SODIUM CHLORIDE 0.9 % IV BOLUS
500.0000 mL | Freq: Once | INTRAVENOUS | Status: AC
  Administered 2020-08-16: 500 mL via INTRAVENOUS

## 2020-08-16 MED ORDER — ONDANSETRON HCL 4 MG/2ML IJ SOLN
4.0000 mg | Freq: Four times a day (QID) | INTRAMUSCULAR | Status: DC | PRN
  Administered 2020-08-16 – 2020-08-17 (×2): 4 mg via INTRAVENOUS
  Filled 2020-08-16 (×2): qty 2

## 2020-08-16 MED ORDER — ACETAMINOPHEN 325 MG PO TABS
650.0000 mg | ORAL_TABLET | Freq: Four times a day (QID) | ORAL | Status: DC | PRN
  Administered 2020-08-19: 650 mg via ORAL
  Filled 2020-08-16: qty 2

## 2020-08-16 MED ORDER — HEPARIN BOLUS VIA INFUSION
1000.0000 [IU] | Freq: Once | INTRAVENOUS | Status: AC
  Administered 2020-08-16: 1000 [IU] via INTRAVENOUS
  Filled 2020-08-16: qty 1000

## 2020-08-16 MED ORDER — SODIUM CHLORIDE 0.9 % IV BOLUS
500.0000 mL | Freq: Once | INTRAVENOUS | Status: AC
Start: 2020-08-16 — End: 2020-08-16
  Administered 2020-08-16: 500 mL via INTRAVENOUS

## 2020-08-16 MED ORDER — MORPHINE SULFATE (PF) 2 MG/ML IV SOLN
2.0000 mg | INTRAVENOUS | Status: DC | PRN
  Administered 2020-08-16 – 2020-08-18 (×4): 2 mg via INTRAVENOUS
  Filled 2020-08-16 (×4): qty 1

## 2020-08-16 MED ORDER — LEVOTHYROXINE SODIUM 100 MCG PO TABS
100.0000 ug | ORAL_TABLET | Freq: Every morning | ORAL | Status: DC
  Administered 2020-08-16 – 2020-08-19 (×4): 100 ug via ORAL
  Filled 2020-08-16 (×4): qty 1

## 2020-08-16 MED ORDER — ACETAMINOPHEN 650 MG RE SUPP: 650.0000 mg | Freq: Four times a day (QID) | RECTAL | Status: DC | PRN

## 2020-08-16 MED ORDER — WARFARIN SODIUM 5 MG PO TABS: 5.0000 mg | ORAL_TABLET | Freq: Every day | ORAL | Status: DC

## 2020-08-16 MED ORDER — HEPARIN (PORCINE) 25000 UT/250ML-% IV SOLN
1350.0000 [IU]/h | INTRAVENOUS | Status: DC
  Administered 2020-08-16: 900 [IU]/h via INTRAVENOUS
  Administered 2020-08-17 (×2): 1150 [IU]/h via INTRAVENOUS
  Administered 2020-08-18 – 2020-08-19 (×2): 1350 [IU]/h via INTRAVENOUS
  Filled 2020-08-16 (×4): qty 250

## 2020-08-16 MED ORDER — METOPROLOL SUCCINATE ER 25 MG PO TB24
25.0000 mg | ORAL_TABLET | Freq: Every day | ORAL | Status: DC
  Administered 2020-08-16 – 2020-08-19 (×3): 25 mg via ORAL
  Filled 2020-08-16 (×4): qty 1

## 2020-08-16 NOTE — H&P (Signed)
TRH H&P    Patient Demographics:    Cindy [name], is a 87 y.o. female  MRN: [id number]  DOB - Jul 01, 1932  Admit Date - 08/15/2020  Referring MD/NP/PA: Stark Jock  Outpatient Primary MD for the patient is Celene Squibb, MD  Patient coming from: Home  Chief complaint- Dyspnea   HPI:    Cindy [name]  is a 87 y.o. female, with history of thyroid disease, paroxysmal atrial fibrillation, GERD, fibrocystic , CHF, and warm presents the ED with a chief complaint of dyspnea.  Patient reports that she had gradual onset of dyspnea, and exact onset is unknown.  She reports that on 28 April it was acutely worsening.  She had associated fatigue and weakness that she reports was "so bad I could not put 1 foot in front of the other."  She also has associated left-sided chest pain that is like a dull ache.  The chest pain started 2 weeks ago and has been constant.  It is worse with position changes, better with stillness.  She reports when she had her last PE the pain was much worse and was stabbing.  She has not had a cough or fever.  She does report occasional nausea and decreased appetite that is been going on for more than 4 months.  She also has intermittent dysuria and reports that she has been on amoxicillin Keflex and nitrofurantoin recently for UTI.  Urine culture pending.  Patient denies any symptoms at this time.  Patient did have an acute PE in June 2021.  She has been on Eliquis since that time.  She reports compliance with her Eliquis.  She reports that at that time she was found to have a DVT in her left lower extremity, and that the swelling in her left lower extremity has never gone down since then.  Patient does not smoke, does not drink, does not use illicit drugs, is full code.  In the ED Temperature 98.3, heart rate 74-80, respiratory rate 14-31, blood pressure as low as 87/54 improved to 97/67 Oxygen sats as low as  83% and improved to 100% on 2 L nasal cannula 2 L bolus in ED Leukocytosis with white blood cell count of 13.0, hemoglobin 12.3 Chemistry panel is unremarkable Tropes are negative x2 CTA chest shows acute PE without evidence of right heart strain Chest x-ray shows chronic interstitial fibrosis in the lungs no evidence of acute pulmonary disease EKG shows a heart rate of 85, sinus rhythm, QTC is 506 Admission requested for management of recurrent PE while on anticoagulation     Review of systems:    In addition to the HPI above,  No Fever-chills, No Headache, No changes with Vision or hearing, No problems swallowing food or Liquids, Admits to chest wall pain and shortness of breath No Abdominal pain, admits to occasional nausea, bowel movements are regular, No Blood in stool or Urine, No dysuria, No new skin rashes or bruises, No new joints pains-aches,  No new weakness, tingling, numbness in any extremity, No recent weight gain or loss,  No polyuria, polydypsia or polyphagia, No significant Mental Stressors.  All other systems reviewed and are negative.    Past History of the following :    Past Medical History:  Diagnosis Date  . Cancer ([place])   . CHF (congestive heart failure) (HCC)    Amyloidosis  . Diarrhea   . Diverticulosis of colon (without mention of hemorrhage)   . Edema   . Esophagitis, unspecified   . Family history of malignant neoplasm of gastrointestinal tract   . Fibrosis of lung ([place]) 03/14/2013  . Flatulence, eructation, and gas pain   . GERD (gastroesophageal reflux disease)   . Left sided ulcerative colitis ([place])   . Maxillary sinus mass   . Osteoporosis, unspecified   . Other and unspecified hyperlipidemia   . Other and unspecified noninfectious gastroenteritis and colitis(558.9)   . Paroxysmal atrial fibrillation ([place]) 07/09/2019  . Stricture and stenosis of esophagus   . Unspecified hypothyroidism   . URI (upper respiratory infection)        Past Surgical History:  Procedure Laterality Date  . ABDOMINAL HYSTERECTOMY    . CATARACT EXTRACTION W/PHACO Bilateral    Dr. Gershon Crane  . CHOLECYSTECTOMY    . FOOT SURGERY    . THYROIDECTOMY, PARTIAL    . YAG LASER APPLICATION Right 2/40/9735   Procedure: YAG LASER APPLICATION;  Surgeon: Elta Guadeloupe T. Gershon Crane, MD;  Location: [facility];  Service: Ophthalmology;  Laterality: Right;      Social History:      Social History   Tobacco Use  . Smoking status: Never Smoker  . Smokeless tobacco: Never Used  Substance Use Topics  . Alcohol use: No    Alcohol/week: 0.0 standard drinks       Family History :     Family History  Problem Relation Age of Onset  . Colon cancer Mother   . Heart attack Father        CHF  . Stomach cancer Sister   . Diabetes Son   . Kidney cancer Son   . Adrenal disorder Son   . Heart disease Brother       Home Medications:   Prior to Admission medications   Medication Sig Start Date End Date Taking? Authorizing Provider  acetaminophen (TYLENOL) 500 MG tablet Take 1,000 mg by mouth as needed.    [provider]  albuterol (PROVENTIL) (2.5 MG/3ML) 0.083% nebulizer solution Take 3 mLs by nebulization 4 (four) times daily as needed. 10/18/19   [provider]  ALPRAZolam Duanne Moron) 0.5 MG tablet Take 1 tablet (0.5 mg total) by mouth at bedtime as needed for anxiety. 03/15/13   Robbie Lis, MD  apixaban (ELIQUIS) 5 MG TABS tablet Take 2 tablets by mouth twice a day for 7 days; and after that 1 tablet twice a day on daily basis. 07/10/19   Barton Dubois, MD  calcium carbonate (TUMS - DOSED IN MG ELEMENTAL CALCIUM) 500 MG chewable tablet Chew 1 tablet by mouth daily.    [provider]  cholecalciferol (VITAMIN D) 1000 UNITS tablet Take 1,000 Units by mouth daily.      [provider]  CYANOCOBALAMIN PO Take 1 tablet by mouth daily.    [provider]  dextromethorphan 15 MG/5ML syrup Take 5 mLs by mouth 2 (two)  times daily.    [provider]  fentaNYL (DURAGESIC) 25 MCG/HR Place 1 patch onto the skin every 3 (three) days. 06/15/19   [provider]  levothyroxine (SYNTHROID) 100 MCG  tablet Take 100 mcg by mouth every morning. 05/30/19   [provider]  metoprolol succinate (TOPROL-XL) 25 MG 24 hr tablet Take 0.5-1 tablets (12.5-25 mg total) by mouth in the morning and at bedtime. Patient takes 1 tablet (17m) in the morning and 1/2 of a tablet (12.584m in the evening 07/10/19   MaBarton DuboisMD  metoprolol tartrate (LOPRESSOR) 25 MG tablet Take 25 mg by mouth.    [provider]  omeprazole (PRILOSEC) 20 MG capsule Take 20 mg by mouth daily.    [provider]  ondansetron (ZOFRAN) 4 MG tablet Take 4 mg by mouth every 8 (eight) hours as needed. 08/03/19   [provider]  Polyethyl Glycol-Propyl Glycol (SYSTANE OP) Apply 1 drop to eye daily.    [provider]  senna (SENOKOT) 8.6 MG tablet Take 2 tablets by mouth as needed for constipation.    [provider]  sodium chloride (OCEAN) 0.65 % nasal spray Place 2 sprays into the nose daily as needed.     [provider]  torsemide (DEMADEX) 20 MG tablet Take 20 mg by mouth daily. 03/28/19   [provider]     Allergies:     Allergies  Allergen Reactions  . Nebivolol Hcl     Other reaction(s): Other (See Comments) Chest tightness,sob,cough,severe tiredness,confusion  . Codeine Nausea Only  . Sulfa Antibiotics Nausea Only  . Sulfonamide Derivatives Nausea Only     Physical Exam:   Vitals  Blood pressure 97/67, pulse 75, temperature 98.3 F (36.8 C), temperature source Oral, resp. rate 20, height 5' 8"  (1.727 m), weight 56.2 kg, SpO2 100 %.  1.  General: Patient lying supine in bed, repositioning frequently due to back pain, no acute distress  2. Psychiatric: Mood and behavior normal for situation, oriented x3, cooperative with exam  3.  Neurologic: Face is symmetric, speech and language are normal, moves all 4 extremities voluntarily, and oriented x3, no acute deficit on limited exam  4. HEENMT:  Head is atraumatic, normocephalic, pupils reactive to light, neck is supple, trachea is midline  5. Respiratory : Lungs are without rales, wheezes, rhonchi, no cyanosis, no increased work of breathing, 2 L nasal cannula in place  6. Cardiovascular : Heart rate is normal, rhythm is regular, no murmurs rubs or gallops, no JVD  7. Gastrointestinal:  Abdomen is soft, nondistended, nontender to palpation, no palpable masses, bowel sounds active  8. Skin:  Skin is warm dry and intact on limited exam  9.Musculoskeletal:  Left lower extremity is edematous past the knee, peripheral pulses palpated, no acute deformities    Data Review:    CBC Recent Labs  Lab 08/16/20 0038  WBC 13.0*  HGB 12.3  HCT 37.8  PLT 178  MCV 97.2  MCH 31.6  MCHC 32.5  RDW 14.0  LYMPHSABS 1.1  MONOABS 0.5  EOSABS 0.0  BASOSABS 0.1   ------------------------------------------------------------------------------------------------------------------  Results for orders placed or performed during the hospital encounter of 08/15/20 (from the past 48 hour(s))  CBC with Differential     Status: Abnormal   Collection Time: 08/16/20 12:38 AM  Result Value Ref Range   WBC 13.0 (H) 4.0 - 10.5 K/uL   RBC 3.89 3.87 - 5.11 MIL/uL   Hemoglobin 12.3 12.0 - 15.0 g/dL   HCT 37.8 36.0 - 46.0 %   MCV 97.2 80.0 - 100.0 fL   MCH 31.6 26.0 - 34.0 pg   MCHC 32.5 30.0 - 36.0 g/dL  RDW 14.0 11.5 - 15.5 %   Platelets 178 150 - 400 K/uL   nRBC 0.0 0.0 - 0.2 %   Neutrophils Relative % 86 %   Neutro Abs 11.3 (H) 1.7 - 7.7 K/uL   Lymphocytes Relative 9 %   Lymphs Abs 1.1 0.7 - 4.0 K/uL   Monocytes Relative 4 %   Monocytes Absolute 0.5 0.1 - 1.0 K/uL   Eosinophils Relative 0 %   Eosinophils Absolute 0.0 0.0 - 0.5 K/uL   Basophils Relative 1 %   Basophils  Absolute 0.1 0.0 - 0.1 K/uL   Immature Granulocytes 0 %   Abs Immature Granulocytes 0.03 0.00 - 0.07 K/uL    Comment: Performed at [facility], [address], [place], [place] [address]  Comprehensive metabolic panel     Status: Abnormal   Collection Time: 08/16/20 12:38 AM  Result Value Ref Range   Sodium 132 (L) 135 - 145 mmol/L   Potassium 3.9 3.5 - 5.1 mmol/L   Chloride 97 (L) 98 - 111 mmol/L   CO2 26 22 - 32 mmol/L   Glucose, Bld 136 (H) 70 - 99 mg/dL    Comment: Glucose reference range applies only to samples taken after fasting for at least 8 hours.   BUN 23 8 - 23 mg/dL   Creatinine, Ser 0.87 0.44 - 1.00 mg/dL   Calcium 8.4 (L) 8.9 - 10.3 mg/dL   Total Protein 6.1 (L) 6.5 - 8.1 g/dL   Albumin 3.3 (L) 3.5 - 5.0 g/dL   AST 22 15 - 41 U/L   ALT 12 0 - 44 U/L   Alkaline Phosphatase 88 38 - 126 U/L   Total Bilirubin 1.1 0.3 - 1.2 mg/dL   GFR, Estimated >60 >60 mL/min    Comment: (NOTE) Calculated using the CKD-EPI Creatinine Equation (2021)    Anion gap 9 5 - 15    Comment: Performed at [facility], [address] [place], [place] [address]  Troponin I (High Sensitivity)     Status: None   Collection Time: 08/16/20 12:38 AM  Result Value Ref Range   Troponin I (High Sensitivity) 7 <18 ng/L    Comment: (NOTE) Elevated high sensitivity troponin I (hsTnI) values and significant  changes across serial measurements may suggest ACS but many other  chronic and acute conditions are known to elevate hsTnI results.  Refer to the "Links" section for chest pain algorithms and additional  guidance. Performed at [facility], [address], [place], [place] [address]   Brain natriuretic peptide     Status: Abnormal   Collection Time: 08/16/20 12:38 AM  Result Value Ref Range   B Natriuretic Peptide 161.0 (H) 0.0 - 100.0 pg/mL    Comment: Performed at [facility], [address] [place], [place] [address]  Troponin I (High Sensitivity)     Status: None   Collection Time: 08/16/20   2:13 AM  Result Value Ref Range   Troponin I (High Sensitivity) 2 <18 ng/L    Comment: (NOTE) Elevated high sensitivity troponin I (hsTnI) values and significant  changes across serial measurements may suggest ACS but many other  chronic and acute conditions are known to elevate hsTnI results.  Refer to the "Links" section for chest pain algorithms and additional  guidance. Performed at [facility], [address] [place], [place] [address]     Chemistries  Recent Labs  Lab 08/16/20 0038  NA 132*  K 3.9  CL 97*  CO2 26  GLUCOSE 136*  BUN 23  CREATININE 0.87  CALCIUM 8.4*  AST 22  ALT 12  ALKPHOS 88  BILITOT 1.1   ------------------------------------------------------------------------------------------------------------------  ------------------------------------------------------------------------------------------------------------------ GFR: Estimated Creatinine Clearance: 41.9 mL/min (by C-G formula based on SCr of 0.87 mg/dL). Liver Function Tests: Recent Labs  Lab 08/16/20 0038  AST 22  ALT 12  ALKPHOS 88  BILITOT 1.1  PROT 6.1*  ALBUMIN 3.3*   No results for input(s): LIPASE, AMYLASE in the last 168 hours. No results for input(s): AMMONIA in the last 168 hours. Coagulation Profile: No results for input(s): INR, PROTIME in the last 168 hours. Cardiac Enzymes: No results for input(s): CKTOTAL, CKMB, CKMBINDEX, TROPONINI in the last 168 hours. BNP (last 3 results) No results for input(s): PROBNP in the last 8760 hours. HbA1C: No results for input(s): HGBA1C in the last 72 hours. CBG: No results for input(s): GLUCAP in the last 168 hours. Lipid Profile: No results for input(s): CHOL, HDL, LDLCALC, TRIG, CHOLHDL, LDLDIRECT in the last 72 hours. Thyroid Function Tests: No results for input(s): TSH, T4TOTAL, FREET4, T3FREE, THYROIDAB in the last 72 hours. Anemia Panel: No results for input(s): VITAMINB12, FOLATE, FERRITIN, TIBC, IRON, RETICCTPCT in the  last 72 hours.  --------------------------------------------------------------------------------------------------------------- Urine analysis:    Component Value Date/Time   COLORURINE YELLOW 03/13/2013 2208   APPEARANCEUR CLEAR 03/13/2013 2208   LABSPEC 1.016 03/13/2013 2208   PHURINE 7.0 03/13/2013 2208   GLUCOSEU NEGATIVE 03/13/2013 2208   HGBUR NEGATIVE 03/13/2013 2208   BILIRUBINUR NEGATIVE 03/13/2013 2208   KETONESUR NEGATIVE 03/13/2013 2208   PROTEINUR NEGATIVE 03/13/2013 2208   UROBILINOGEN 0.2 03/13/2013 2208   NITRITE NEGATIVE 03/13/2013 2208   LEUKOCYTESUR NEGATIVE 03/13/2013 2208      Imaging Results:    CT Angio Chest PE W and/or Wo Contrast  Result Date: 08/16/2020 CLINICAL DATA:  Amyloidosis, pulmonary embolism, dyspnea EXAM: CT ANGIOGRAPHY CHEST WITH CONTRAST TECHNIQUE: Multidetector CT imaging of the chest was performed using the standard protocol during bolus administration of intravenous contrast. Multiplanar CT image reconstructions and MIPs were obtained to evaluate the vascular anatomy. CONTRAST:  151m OMNIPAQUE IOHEXOL 350 MG/ML SOLN COMPARISON:  07/08/2019 FINDINGS: Cardiovascular: There is a branching intraluminal filling defect identified within the right upper lobe and right middle lobe pulmonary arteries which, while similar in location to prior examination, demonstrates new involvement of the right middle lobe pulmonary artery and demonstrates a central, as opposed to eccentric filling defect and is most compatible with an acute pulmonary embolism. The overall embolic burden is small. The central pulmonary arteries are enlarged, similar to prior examination, in keeping with changes of pulmonary arterial hypertension. Mild global cardiomegaly. Left subclavian pacemaker leads are seen within the a right atrium and right ventricle. No pericardial effusion. Minimal coronary artery calcification. Mild atherosclerotic calcification within the thoracic aorta. No  aortic aneurysm. Mediastinum/Nodes: Shotty mediastinal and bilateral hilar adenopathy is again identified without frank pathologic enlargement. Visualized thyroid is unremarkable. The esophagus is unremarkable. Lungs/Pleura: Similar prior examination, there is diffuse septal thickening as well as areas of peripheral micronodularity and new developing basilar, peripheral consolidation. Given the patient's history, this may represent progressive changes of pulmonary amyloidosis. Small right pleural effusion again noted. No pneumothorax. No central obstructing lesion. Upper Abdomen: Simple cyst within the left hepatic lobe. No acute abnormality. Musculoskeletal: No acute bone abnormality. No lytic or blastic bone lesions. Review of the MIP images confirms the above findings. IMPRESSION: Acute pulmonary embolism. The embolic burden is small. No CT evidence of right  heart strain. Mild global cardiomegaly. Morphologic changes in keeping with pulmonary arterial hypertension. Constellation of pulmonary findings including slightly irregular septal thickening, micronodularity, peripheral reticulation, and now progressive basilar, peripheral consolidation. Given the patient's history, these findings are highly suspicious for progressive pulmonary emboli ptosis. Attempts are being made at this time to contact the managing clinician for direct verbal communication. Electronically Signed   By: Fidela Salisbury MD   On: 08/16/2020 04:20   DG Chest Port 1 View  Result Date: 08/16/2020 CLINICAL DATA:  Weakness. History of fibrosis of lung, edema, bone cancer. EXAM: PORTABLE CHEST 1 VIEW COMPARISON:  11/06/2019 FINDINGS: Cardiac pacemaker. Mild cardiac enlargement. Diffuse coarse interstitial changes in the lungs likely representing interstitial fibrosis. No change since prior study. No developing consolidation or edema. Blunting of the right costophrenic angle is also unchanged, likely pleural thickening. Calcification of the  aorta. Mediastinal contours appear intact. IMPRESSION: Chronic interstitial fibrosis in the lungs. No evidence of active pulmonary disease. Electronically Signed   By: Lucienne Capers M.D.   On: 08/16/2020 00:50      Assessment & Plan:    Principal Problem:   Acute pulmonary embolism ([place]) Active Problems:   Hypothyroidism   Paroxysmal atrial fibrillation (HCC)   Acute respiratory failure with hypoxia (HCC)   1. Acute PE 1. While on Eliquis 2. Hypotension, hypoxia at presentation 3. Start heparin, hold eliquis 4. Start bridging to Coumadin 5. Add on INR to previous labs 6. Echo, and US DVT BL lower extremities in the AM 7. Monitor on tele  2. Acute respiratory failure with hypoxia 1. 2/2 to above 2. O2 sats documented as low as 83% 3. Improved with 2 L [place] 4. Continue treatment as above 3. Afib 1. Continue metoprolol 4. Thyroid disease 1. Continue synthroid 5. Prolonged QT 1. Minimally prolonged 2. Monitor on tele 3. Avoid QT prolonging agents when possible 6. UTI 1. Reports she is in the middle of a course for nitrofurantoin 2. We will check UA for resolution of infection 3. If UA is still abnormal, consider urine culture and starting antibiotics 4. Does have a leukocytosis at 13.0 but this is more likely an acute phase reactant to PE and stress   DVT Prophylaxis-   heparin  AM Labs Ordered, also please review Full Orders  Family Communication: No Family at bedside Code Status:  Full Admission status:Inpatient :The appropriate admission status for this patient is INPATIENT. Inpatient status is judged to be reasonable and necessary in order to provide the required intensity of service to ensure the patient's safety. The patient's presenting symptoms, physical exam findings, and initial radiographic and laboratory data in the context of their chronic comorbidities is felt to place them at high risk for further clinical deterioration. Furthermore, it is not anticipated  that the patient will be medically stable for discharge from the hospital within 2 midnights of admission. The following factors support the admission status of inpatient.     The patient's presenting symptoms include Dyspnea The worrisome physical exam findings include Hypoxia The initial radiographic and laboratory data are worrisome because of Acute PE The chronic co-morbidities include thyroid disease, CHF, fibrosis of lung, Afib       * I certify that at the point of admission it is my clinical judgment that the patient will require inpatient hospital care spanning beyond 2 midnights from the point of admission due to high intensity of service, high risk for further deterioration and high frequency of surveillance required.*  Time spent  in minutes :[place]

## 2020-08-16 NOTE — Progress Notes (Signed)
ANTICOAGULATION CONSULT NOTE - Initial Consult  Pharmacy Consult for heparin Indication: pulmonary embolus  Allergies  Allergen Reactions  . Nebivolol Hcl     Other reaction(s): Other (See Comments) Chest tightness,sob,cough,severe tiredness,confusion  . Codeine Nausea Only  . Sulfa Antibiotics Nausea Only  . Sulfonamide Derivatives Nausea Only    Patient Measurements: Height: 5' 8"  (172.7 cm) Weight: 56.2 kg (124 lb) IBW/kg (Calculated) : 63.9  Vital Signs: Temp: 98.3 F (36.8 C) (04/29 0426) Temp Source: Oral (04/29 0426) BP: 97/67 (04/29 0426) Pulse Rate: 75 (04/29 0426)  Labs: Recent Labs    08/16/20 0038 08/16/20 0213  HGB 12.3  --   HCT 37.8  --   PLT 178  --   CREATININE 0.87  --   TROPONINIHS 7 2    Estimated Creatinine Clearance: 41.9 mL/min (by C-G formula based on SCr of 0.87 mg/dL).   Medical History: Past Medical History:  Diagnosis Date  . Cancer ([place])   . CHF (congestive heart failure) (HCC)    Amyloidosis  . Diarrhea   . Diverticulosis of colon (without mention of hemorrhage)   . Edema   . Esophagitis, unspecified   . Family history of malignant neoplasm of gastrointestinal tract   . Fibrosis of lung ([place]) 03/14/2013  . Flatulence, eructation, and gas pain   . GERD (gastroesophageal reflux disease)   . Left sided ulcerative colitis ([place])   . Maxillary sinus mass   . Osteoporosis, unspecified   . Other and unspecified hyperlipidemia   . Other and unspecified noninfectious gastroenteritis and colitis(558.9)   . Paroxysmal atrial fibrillation ([employer]) 07/09/2019  . Stricture and stenosis of esophagus   . Unspecified hypothyroidism   . URI (upper respiratory infection)     Assessment: 87yo female on Eliquis PTA for Afib and h/o PE c/o weakness, hypotension, and fatigue, found to have PE w/ small embolic burden despite Eliquis treatment >> to start heparin; last dose of Eliquis taken 4/28 pm.  Goal of Therapy:  Heparin level 0.3-0.7  units/ml aPTT 66-102 seconds Monitor platelets by anticoagulation protocol: Yes   Plan:  Will start heparin gtt at 900 units/hr and monitor heparin levels, aPTT (while Eliquis affects anti-Xa), and CBC.  Wynona Neat, PharmD, BCPS  08/16/2020,4:53 AM

## 2020-08-16 NOTE — Progress Notes (Signed)
Patient admitted to the hospital admitted this morning by Dr. Clearence Ped  Patient seen and examined.  Continues to feel somewhat short of breath.  She does have left-sided chest pain.  She is noted to have significant left lower extremity edema which appears to be a chronic issue  Assessment/plan  Acute pulmonary embolism -Patient has been compliant with full dose Eliquis -Discussed with Dr. Delton Coombes who recommended transitioning patient to Coumadin -Currently on heparin bridge -Follow INR -Venous Dopplers negative for DVT in lower extremities bilaterally -Echocardiogram shows grade 2 diastolic dysfunction, normal EF, moderately elevated pulmonary artery pressure  Acute respiratory failure with hypoxia -Oxygen sats down to 83% on room air -Continue supplemental oxygen and wean off as tolerated  Atrial fibrillation -Continue on metoprolol -She is anticoagulated  Hypothyroidism -Continue on Synthroid  [employer]

## 2020-08-16 NOTE — Progress Notes (Signed)
ANTICOAGULATION CONSULT NOTE -   Pharmacy Consult for heparin,  Start Warfarin  Indication: pulmonary embolus  Allergies  Allergen Reactions  . Nebivolol Hcl     Other reaction(s): Other (See Comments) Chest tightness,sob,cough,severe tiredness,confusion  . Codeine Nausea Only  . Sulfa Antibiotics Nausea Only  . Sulfonamide Derivatives Nausea Only    Patient Measurements: Height: 5' 8"  (172.7 cm) Weight: 57.9 kg (127 lb 10.3 oz) IBW/kg (Calculated) : 63.9  Vital Signs: Temp: 97.8 F (36.6 C) (04/29 1238) Temp Source: Oral (04/29 1238) BP: 100/63 (04/29 1238) Pulse Rate: 75 (04/29 1238)  Labs: Recent Labs    08/16/20 0038 08/16/20 0213 08/16/20 0530 08/16/20 1338 08/16/20 1340  HGB 12.3  --   --   --   --   HCT 37.8  --   --   --   --   PLT 178  --   --   --   --   APTT  --   --   --   --  43*  LABPROT  --   --  17.2*  --   --   INR  --   --  1.4*  --   --   HEPARINUNFRC  --   --   --  >1.10*  --   CREATININE 0.87  --   --   --   --   TROPONINIHS 7 2  --   --   --     Estimated Creatinine Clearance: 43.2 mL/min (by C-G formula based on SCr of 0.87 mg/dL).   Medical History: Past Medical History:  Diagnosis Date  . Cancer ([place])   . CHF (congestive heart failure) (HCC)    Amyloidosis  . Diarrhea   . Diverticulosis of colon (without mention of hemorrhage)   . Edema   . Esophagitis, unspecified   . Family history of malignant neoplasm of gastrointestinal tract   . Fibrosis of lung ([place]) 03/14/2013  . Flatulence, eructation, and gas pain   . GERD (gastroesophageal reflux disease)   . Left sided ulcerative colitis ([place])   . Maxillary sinus mass   . Osteoporosis, unspecified   . Other and unspecified hyperlipidemia   . Other and unspecified noninfectious gastroenteritis and colitis(558.9)   . Paroxysmal atrial fibrillation ([place]) 07/09/2019  . Stricture and stenosis of esophagus   . Unspecified hypothyroidism   . URI (upper respiratory infection)      Assessment: 87yo female on Eliquis PTA for Afib and h/o PE c/o weakness, hypotension, and fatigue, found to have PE w/ small embolic burden despite Eliquis treatment >> to start heparin; last dose of Eliquis taken 4/28 pm.  HL >1.10: still elevated due to Eliquis APTT 43- subtherapeutic > thus rebolused with heparin 1000 units IV and  Heparin drip increased to 1050 units/hr.   Will check 8 hour aPTT  tonight ~ 2330.   Lexa consulted to start Warfarin 08/16/20,  switching oral anticoagulation from Eliquis prior to admission to Warfarin.   Continue with heparin IV as bridge until INR therapeutic.  Last dose of Eliquis was taken 4/28 @20 :00    4/29 Venous Dopplers BLE negative. INR = 1.4 today, CBC stable   Goal of Therapy:  INR = 2-3 Heparin level 0.3-0.7 units/ml aPTT 66-102 seconds Monitor platelets by anticoagulation protocol: Yes   Plan:  Give Warfarin 57m tonight x1 Monitor Daily INR Continue IV heparin bridge at 1050 units/hr Will follow up 8 hr aPTT due @ 23:30 Continue to monitor H&H  and s/s of bleeding.  Nicole Cella, RPh Clinical Pharmacist 08/16/2020 7:47 PM

## 2020-08-16 NOTE — Progress Notes (Signed)
Stated uses oxygen at home from time to time.  Tried on room air and O2 was 86. Put back on O2 and 96 on 2 liters.

## 2020-08-16 NOTE — Progress Notes (Signed)
  Echocardiogram 2D Echocardiogram has been performed.  Cindy [name] 08/16/2020, 10:55 AM

## 2020-08-16 NOTE — ED Notes (Signed)
Pt returned from CT, VS updated, pt on [facility] to maintain sats above 94%, EDP at bedside, updated pt on POC, denies further needs at this time. Side rails up x 2, bed locked and low, call bell within reach. Will continue to monitor.

## 2020-08-16 NOTE — ED Notes (Signed)
Pt fluids completed, on phone with son in room. NAD noted. On cardia monitoring, HYPOtensive, otherwise VSS, o2 above 94% on [facility]

## 2020-08-16 NOTE — Progress Notes (Signed)
ANTICOAGULATION CONSULT NOTE -   Pharmacy Consult for heparin Indication: pulmonary embolus  Allergies  Allergen Reactions  . Nebivolol Hcl     Other reaction(s): Other (See Comments) Chest tightness,sob,cough,severe tiredness,confusion  . Codeine Nausea Only  . Sulfa Antibiotics Nausea Only  . Sulfonamide Derivatives Nausea Only    Patient Measurements: Height: 5' 8"  (172.7 cm) Weight: 57.9 kg (127 lb 10.3 oz) IBW/kg (Calculated) : 63.9  Vital Signs: Temp: 97.8 F (36.6 C) (04/29 1238) Temp Source: Oral (04/29 1238) BP: 100/63 (04/29 1238) Pulse Rate: 75 (04/29 1238)  Labs: Recent Labs    08/16/20 0038 08/16/20 0213 08/16/20 0530 08/16/20 1338 08/16/20 1340  HGB 12.3  --   --   --   --   HCT 37.8  --   --   --   --   PLT 178  --   --   --   --   APTT  --   --   --   --  43*  LABPROT  --   --  17.2*  --   --   INR  --   --  1.4*  --   --   HEPARINUNFRC  --   --   --  >1.10*  --   CREATININE 0.87  --   --   --   --   TROPONINIHS 7 2  --   --   --     Estimated Creatinine Clearance: 43.2 mL/min (by C-G formula based on SCr of 0.87 mg/dL).   Medical History: Past Medical History:  Diagnosis Date  . Cancer ([place])   . CHF (congestive heart failure) (HCC)    Amyloidosis  . Diarrhea   . Diverticulosis of colon (without mention of hemorrhage)   . Edema   . Esophagitis, unspecified   . Family history of malignant neoplasm of gastrointestinal tract   . Fibrosis of lung ([place]) 03/14/2013  . Flatulence, eructation, and gas pain   . GERD (gastroesophageal reflux disease)   . Left sided ulcerative colitis ([place])   . Maxillary sinus mass   . Osteoporosis, unspecified   . Other and unspecified hyperlipidemia   . Other and unspecified noninfectious gastroenteritis and colitis(558.9)   . Paroxysmal atrial fibrillation ([place]) 07/09/2019  . Stricture and stenosis of esophagus   . Unspecified hypothyroidism   . URI (upper respiratory infection)     Assessment: 87yo  female on Eliquis PTA for Afib and h/o PE c/o weakness, hypotension, and fatigue, found to have PE w/ small embolic burden despite Eliquis treatment >> to start heparin; last dose of Eliquis taken 4/28 pm.  HL >1.10: still elevated due to Eliquis APTT 43- subtherapeutic   Goal of Therapy:  Heparin level 0.3-0.7 units/ml aPTT 66-102 seconds Monitor platelets by anticoagulation protocol: Yes   Plan:  Rebolus 1000 units IV x 1  Increase heparin infusion to 1050 units/hr Heparin level in 8 hours and daily. Continue to monitor H&H and s/s of bleeding.  Margot Ables, PharmD Clinical Pharmacist 08/16/2020 3:30 PM

## 2020-08-16 NOTE — ED Provider Notes (Signed)
[facility] EMERGENCY DEPARTMENT Provider Note   CSN: [id number] Arrival date & time: 08/15/20  2224     History Chief Complaint  Patient presents with  . Weakness    Cindy [name] is a 87 y.o. female.  Patient is an 87 year old female with past medical history of amyloidosis, congestive heart failure, diverticulosis, GERD, paroxysmal atrial fibrillation, and recently diagnosed pulmonary embolism for which she is taking Eliquis.  Patient presents today for evaluation of weakness and low blood pressure.  She tells me she felt well this morning, then went around town doing multiple errands and going to multiple stores.  When she arrived home, she felt very fatigued, very weak.  She checked her blood pressure and was less than 100.  She presents for evaluation of this.  She denies specific complaints such as chest pain, shortness of breath, fevers, or chills.  The history is provided by the patient.  Weakness Severity:  Moderate Onset quality:  Sudden Duration:  1 day Timing:  Constant Progression:  Unchanged Chronicity:  New Worsened by:  Nothing Ineffective treatments:  None tried      Past Medical History:  Diagnosis Date  . Cancer ([place])   . CHF (congestive heart failure) (HCC)    Amyloidosis  . Diarrhea   . Diverticulosis of colon (without mention of hemorrhage)   . Edema   . Esophagitis, unspecified   . Family history of malignant neoplasm of gastrointestinal tract   . Fibrosis of lung ([place]) 03/14/2013  . Flatulence, eructation, and gas pain   . GERD (gastroesophageal reflux disease)   . Left sided ulcerative colitis ([place])   . Maxillary sinus mass   . Osteoporosis, unspecified   . Other and unspecified hyperlipidemia   . Other and unspecified noninfectious gastroenteritis and colitis(558.9)   . Paroxysmal atrial fibrillation ([place]) 07/09/2019  . Stricture and stenosis of esophagus   . Unspecified hypothyroidism   . URI (upper respiratory infection)      Patient Active Problem List   Diagnosis Date Noted  . Exudative age-related macular degeneration of left eye with active choroidal neovascularization ([place]) 08/07/2019  . Cystoid macular edema of left eye 08/07/2019  . Posterior vitreous detachment of left eye 08/07/2019  . Advanced nonexudative age-related macular degeneration of both eyes without subfoveal involvement 08/07/2019  . Light chain (AL) amyloidosis (HCC)   . Paroxysmal atrial fibrillation ([place]) 07/09/2019  . Chronic diastolic heart failure ([place])   . Pulmonary embolism ([place]) 07/08/2019  . Hypertension   . Cardiac amyloidosis ([place]) 10/27/2014  . Chest pain 10/25/2014  . Acute renal failure ([place]) 10/25/2014  . Hyponatremia 10/25/2014  . Hypotension 10/25/2014  . Acute on chronic diastolic heart failure ([place]) 10/25/2014  . Malnutrition of moderate degree ([place]) 10/25/2014  . Change in bowel habits 08/20/2014  . Cardiac failure ([place]) 06/26/2014  . ILD (interstitial lung disease) ([place]) 06/26/2014  . Upper airway cough syndrome 04/21/2014  . DOE (dyspnea on exertion) 04/17/2014  . Sinus tachycardia 06/26/2013  . Acute diastolic heart failure ([place]) 03/28/2013  . Bronchitis 03/14/2013  . Chest tightness 03/14/2013  . Interstitial pneumonitis ([place]) 03/14/2013  . Fibrosis of lung ([place]) 03/14/2013  . Family history of malignant neoplasm of gastrointestinal tract 05/06/2011  . STRICTURE AND STENOSIS OF ESOPHAGUS 05/27/2009  . OSTEOPOROSIS 10/16/2008  . EDEMA 10/16/2008  . Hypothyroidism 05/09/2007  . Hyperlipidemia 05/09/2007  . GERD (gastroesophageal reflux disease) 05/09/2007  . COLITIS 05/09/2007  . Left sided ulcerative colitis ([place]) 08/07/2005  . DIVERTICULOSIS, COLON  05/15/2004    Past Surgical History:  Procedure Laterality Date  . ABDOMINAL HYSTERECTOMY    . CATARACT EXTRACTION W/PHACO Bilateral    Dr. Gershon Crane  . CHOLECYSTECTOMY    . FOOT SURGERY    . THYROIDECTOMY, PARTIAL    . YAG LASER APPLICATION  Right 0/71/2197   Procedure: YAG LASER APPLICATION;  Surgeon: Elta Guadeloupe T. Gershon Crane, MD;  Location: [facility];  Service: Ophthalmology;  Laterality: Right;     OB History    Gravida  3   Para  2   Term  2   Preterm      AB  1   Living        SAB  1   IAB      Ectopic      Multiple      Live Births              Family History  Problem Relation Age of Onset  . Colon cancer Mother   . Heart attack Father        CHF  . Stomach cancer Sister   . Diabetes Son   . Kidney cancer Son   . Adrenal disorder Son   . Heart disease Brother     Social History   Tobacco Use  . Smoking status: Never Smoker  . Smokeless tobacco: Never Used  Vaping Use  . Vaping Use: Never used  Substance Use Topics  . Alcohol use: No    Alcohol/week: 0.0 standard drinks  . Drug use: No    Home Medications Prior to Admission medications   Medication Sig Start Date End Date Taking? Authorizing Provider  acetaminophen (TYLENOL) 500 MG tablet Take 1,000 mg by mouth as needed.    [provider]  albuterol (PROVENTIL) (2.5 MG/3ML) 0.083% nebulizer solution Take 3 mLs by nebulization 4 (four) times daily as needed. 10/18/19   [provider]  ALPRAZolam Duanne Moron) 0.5 MG tablet Take 1 tablet (0.5 mg total) by mouth at bedtime as needed for anxiety. 03/15/13   Robbie Lis, MD  apixaban (ELIQUIS) 5 MG TABS tablet Take 2 tablets by mouth twice a day for 7 days; and after that 1 tablet twice a day on daily basis. 07/10/19   Barton Dubois, MD  calcium carbonate (TUMS - DOSED IN MG ELEMENTAL CALCIUM) 500 MG chewable tablet Chew 1 tablet by mouth daily.    [provider]  cholecalciferol (VITAMIN D) 1000 UNITS tablet Take 1,000 Units by mouth daily.      [provider]  CYANOCOBALAMIN PO Take 1 tablet by mouth daily.    [provider]  dextromethorphan 15 MG/5ML syrup Take 5 mLs by mouth 2 (two) times daily.    [provider]  fentaNYL (DURAGESIC)  25 MCG/HR Place 1 patch onto the skin every 3 (three) days. 06/15/19   [provider]  levothyroxine (SYNTHROID) 100 MCG tablet Take 100 mcg by mouth every morning. 05/30/19   [provider]  metoprolol succinate (TOPROL-XL) 25 MG 24 hr tablet Take 0.5-1 tablets (12.5-25 mg total) by mouth in the morning and at bedtime. Patient takes 1 tablet (14m) in the morning and 1/2 of a tablet (12.546m in the evening 07/10/19   MaBarton DuboisMD  metoprolol tartrate (LOPRESSOR) 25 MG tablet Take 25 mg by mouth.    [provider]  omeprazole (PRILOSEC) 20 MG capsule Take 20 mg by mouth daily.    [provider]  ondansetron (ZOFRAN) 4 MG tablet  Take 4 mg by mouth every 8 (eight) hours as needed. 08/03/19   [provider]  Polyethyl Glycol-Propyl Glycol (SYSTANE OP) Apply 1 drop to eye daily.    [provider]  senna (SENOKOT) 8.6 MG tablet Take 2 tablets by mouth as needed for constipation.    [provider]  sodium chloride (OCEAN) 0.65 % nasal spray Place 2 sprays into the nose daily as needed.     [provider]  torsemide (DEMADEX) 20 MG tablet Take 20 mg by mouth daily. 03/28/19   [provider]    Allergies    Nebivolol hcl, Codeine, Sulfa antibiotics, and Sulfonamide derivatives  Review of Systems   Review of Systems  Neurological: Positive for weakness.  All other systems reviewed and are negative.   Physical Exam Updated Vital Signs BP (!) 88/59   Pulse 75   Temp 98.8 F (37.1 C) (Oral)   Resp 16   Ht 5' 8"  (1.727 m)   Wt 56.2 kg   SpO2 97%   BMI 18.85 kg/m   Physical Exam Vitals and nursing note reviewed.  Constitutional:      General: She is not in acute distress.    Appearance: She is well-developed. She is not diaphoretic.  HENT:     Head: Normocephalic and atraumatic.  Cardiovascular:     Rate and Rhythm: Normal rate and regular rhythm.     Heart sounds: No murmur heard. No friction rub.  No gallop.   Pulmonary:     Effort: Pulmonary effort is normal. No respiratory distress.     Breath sounds: Normal breath sounds. No wheezing.  Abdominal:     General: Bowel sounds are normal. There is no distension.     Palpations: Abdomen is soft.     Tenderness: There is no abdominal tenderness.  Musculoskeletal:        General: No tenderness. Normal range of motion.     Cervical back: Normal range of motion and neck supple.     Right lower leg: No edema.     Left lower leg: Edema present.     Comments: There is nonpitting edema of the left lower extremity.  Skin:    General: Skin is warm and dry.  Neurological:     Mental Status: She is alert and oriented to person, place, and time.     ED Results / Procedures / Treatments   Labs (all labs ordered are listed, but only abnormal results are displayed) Labs Reviewed  CBC WITH DIFFERENTIAL/PLATELET  COMPREHENSIVE METABOLIC PANEL  TROPONIN I (HIGH SENSITIVITY)    EKG EKG Interpretation  Date/Time:  Friday August 16 2020 00:47:35 EDT Ventricular Rate:  85 PR Interval:  224 QRS Duration: 103 QT Interval:  425 QTC Calculation: 506 R Axis:   -58 Text Interpretation: Sinus rhythm Prolonged PR interval Probable left atrial enlargement Left ventricular hypertrophy Inferior infarct, old Anterior infarct, old Prolonged QT interval No significant change since 08/15/2020 Confirmed by Veryl Speak [phone]) on 08/16/2020 1:50:35 AM   Radiology No results found.  Procedures Procedures   Medications Ordered in ED Medications  sodium chloride 0.9 % bolus 500 mL (has no administration in time range)    ED Course  I have reviewed the triage vital signs and the nursing notes.  Pertinent labs & imaging results that were available during my care of the patient were reviewed by me and considered in my medical decision making (see chart for details).    MDM Rules/Calculators/A&P  Patient presenting here with complaints of weakness  that began earlier today.  Patient has been hypotensive since presentation.  She was given 2 separate boluses of normal saline with no improvement.  She was also found to have episodes of hypoxia with saturations dropping into the low 80s.  This corrects when started on oxygen 2 L by nasal cannula.  Due to her history of PE, this raise my suspicion for the possibility of recurrence.  CTA was obtained showing an acute pulmonary embolism with no right heart strain.  Patient to be started on heparin and will be admitted to the hospitalist service.  CRITICAL CARE Performed by: Veryl Speak Total critical care time: 45 minutes Critical care time was exclusive of separately billable procedures and treating other patients. Critical care was necessary to treat or prevent imminent or life-threatening deterioration. Critical care was time spent personally by me on the following activities: development of treatment plan with patient and/or surrogate as well as nursing, discussions with consultants, evaluation of patient's response to treatment, examination of patient, obtaining history from patient or surrogate, ordering and performing treatments and interventions, ordering and review of laboratory studies, ordering and review of radiographic studies, pulse oximetry and re-evaluation of patient's condition.   Final Clinical Impression(s) / ED Diagnoses Final diagnoses:  None    Rx / DC Orders ED Discharge Orders    None       Veryl Speak, MD 08/16/20 [phone]

## 2020-08-16 NOTE — ED Notes (Signed)
Patient transported to CT 

## 2020-08-17 DIAGNOSIS — I5032 Chronic diastolic (congestive) heart failure: Secondary | ICD-10-CM

## 2020-08-17 DIAGNOSIS — I43 Cardiomyopathy in diseases classified elsewhere: Secondary | ICD-10-CM

## 2020-08-17 DIAGNOSIS — E854 Organ-limited amyloidosis: Secondary | ICD-10-CM

## 2020-08-17 LAB — COMPREHENSIVE METABOLIC PANEL
ALT: 10 U/L (ref 0–44)
AST: 15 U/L (ref 15–41)
Albumin: 2.8 g/dL — ABNORMAL LOW (ref 3.5–5.0)
Alkaline Phosphatase: 69 U/L (ref 38–126)
Anion gap: 6 (ref 5–15)
BUN: 17 mg/dL (ref 8–23)
CO2: 25 mmol/L (ref 22–32)
Calcium: 8.3 mg/dL — ABNORMAL LOW (ref 8.9–10.3)
Chloride: 99 mmol/L (ref 98–111)
Creatinine, Ser: 0.65 mg/dL (ref 0.44–1.00)
GFR, Estimated: >60 mL/min (ref >60)
Glucose, Bld: 89 mg/dL (ref 70–99)
Potassium: 3.5 mmol/L (ref 3.5–5.1)
Sodium: 130 mmol/L — ABNORMAL LOW (ref 135–145)
Total Bilirubin: 1.3 mg/dL — ABNORMAL HIGH (ref 0.3–1.2)
Total Protein: 5.2 g/dL — ABNORMAL LOW (ref 6.5–8.1)

## 2020-08-17 LAB — HEPARIN LEVEL (UNFRACTIONATED)
Heparin Unfractionated: 0.83 IU/mL — ABNORMAL HIGH (ref 0.30–0.70)
Heparin Unfractionated: 0.54 IU/mL (ref 0.30–0.70)

## 2020-08-17 LAB — APTT
aPTT: 61 seconds — ABNORMAL HIGH (ref 24–36)
aPTT: 70 seconds — ABNORMAL HIGH (ref 24–36)
aPTT: 53 seconds — ABNORMAL HIGH (ref 24–36)

## 2020-08-17 LAB — PROTIME-INR
INR: 1.4 — ABNORMAL HIGH (ref 0.8–1.2)
Prothrombin Time: 17.6 seconds — ABNORMAL HIGH (ref 11.4–15.2)

## 2020-08-17 LAB — MAGNESIUM: Magnesium: 1.8 mg/dL (ref 1.7–2.4)

## 2020-08-17 MED ORDER — FENTANYL 25 MCG/HR TD PT72
1.0000 | MEDICATED_PATCH | TRANSDERMAL | Status: DC
  Administered 2020-08-17: 1 via TRANSDERMAL
  Filled 2020-08-17: qty 1

## 2020-08-17 MED ORDER — HEPARIN BOLUS VIA INFUSION
800.0000 [IU] | Freq: Once | INTRAVENOUS | Status: AC
  Administered 2020-08-17: 800 [IU] via INTRAVENOUS
  Filled 2020-08-17: qty 800

## 2020-08-17 MED ORDER — WARFARIN SODIUM 7.5 MG PO TABS
7.5000 mg | ORAL_TABLET | Freq: Once | ORAL | Status: AC
  Administered 2020-08-17: 7.5 mg via ORAL
  Filled 2020-08-17: qty 1

## 2020-08-17 NOTE — Plan of Care (Signed)
  Problem: Acute Rehab PT Goals(only PT should resolve) Goal: Pt Will Ambulate Outcome: Progressing Flowsheets (Taken 08/17/2020 1043) Pt will Ambulate:  with modified independence  > 125 feet  with rolling walker Note: Without need for standing rest period to improve functional activity tolerance Goal: Pt Will Go Up/Down Stairs Outcome: Progressing Flowsheets (Taken 08/17/2020 1043) Pt will Go Up / Down Stairs:  1-2 stairs  Independently Goal: Pt/caregiver will Perform Home Exercise Program Outcome: Progressing Flowsheets (Taken 08/17/2020 1043) Pt/caregiver will Perform Home Exercise Program:  For increased strengthening  Independently   10:44 AM, 08/17/20 M. Sherlyn Lees, PT, DPT Physical Therapist- [place] Office Number: [phone]

## 2020-08-17 NOTE — Progress Notes (Signed)
[place] for heparin/warfarin Indication: pulmonary embolus  Allergies  Allergen Reactions  . Nebivolol Hcl     Other reaction(s): Other (See Comments) Chest tightness,sob,cough,severe tiredness,confusion  . Codeine Nausea Only  . Sulfa Antibiotics Nausea Only  . Sulfonamide Derivatives Nausea Only    Patient Measurements: Height: 5' 8"  (172.7 cm) Weight: 57.9 kg (127 lb 10.3 oz) IBW/kg (Calculated) : 63.9  Vital Signs: Temp: 98.9 F (37.2 C) (04/30 1437) Temp Source: Oral (04/30 1437) BP: 116/60 (04/30 1437) Pulse Rate: 75 (04/30 1437)  Labs: Recent Labs    08/16/20 0038 08/16/20 0213 08/16/20 0530 08/16/20 1338 08/16/20 1340 08/17/20 0023 08/17/20 0554 08/17/20 0824 08/17/20 1551  HGB 12.3  --   --   --   --   --   --   --   --   HCT 37.8  --   --   --   --   --   --   --   --   PLT 178  --   --   --   --   --   --   --   --   APTT  --   --   --   --    < > 61*  --  70* 53*  LABPROT  --   --  17.2*  --   --   --   --  17.6*  --   INR  --   --  1.4*  --   --   --   --  1.4*  --   HEPARINUNFRC  --   --   --  >1.10*  --   --   --  0.83* 0.54  CREATININE 0.87  --   --   --   --   --  0.65  --   --   TROPONINIHS 7 2  --   --   --   --   --   --   --    < > = values in this interval not displayed.    Estimated Creatinine Clearance: 47 mL/min (by C-G formula based on SCr of 0.65 mg/dL).   Assessment: 87yo female on Eliquis PTA for Afib and h/o PE c/o weakness, hypotension, and fatigue, found to have PE w/ small embolic burden despite Eliquis treatment >> to start heparin; last dose of Eliquis taken 4/28 pm.  Eliquis affecting heparin level. aPTT 53 sec (subtherapeutic) on gtt at 1150 units/hr. No issues with line or bleeding reported per RN.  INR 1.4   Goal of Therapy:  INR 2-3 Heparin level 0.3-0.7 units/ml aPTT 66-102 seconds Monitor platelets by anticoagulation protocol: Yes   Plan:  Increase IV heparin bridge to  1350 units/hr F/u 8hr aPTT Warfarin 7.71m x 1   GThomasenia Sales PharmD, MBA, BCGP Clinical Pharmacist  08/17/2020 7:29 PM

## 2020-08-17 NOTE — Progress Notes (Addendum)
[place] for heparin/warfarin Indication: pulmonary embolus  Allergies  Allergen Reactions  . Nebivolol Hcl     Other reaction(s): Other (See Comments) Chest tightness,sob,cough,severe tiredness,confusion  . Codeine Nausea Only  . Sulfa Antibiotics Nausea Only  . Sulfonamide Derivatives Nausea Only    Patient Measurements: Height: 5' 8"  (172.7 cm) Weight: 57.9 kg (127 lb 10.3 oz) IBW/kg (Calculated) : 63.9  Vital Signs: Temp: 98 F (36.7 C) (04/30 0420) BP: 105/61 (04/30 0420) Pulse Rate: 81 (04/30 0420)  Labs: Recent Labs    08/16/20 0038 08/16/20 0213 08/16/20 0530 08/16/20 1338 08/16/20 1340 08/17/20 0023 08/17/20 0554 08/17/20 0824  HGB 12.3  --   --   --   --   --   --   --   HCT 37.8  --   --   --   --   --   --   --   PLT 178  --   --   --   --   --   --   --   APTT  --   --   --   --  43* 61*  --  70*  LABPROT  --   --  17.2*  --   --   --   --   --   INR  --   --  1.4*  --   --   --   --   --   HEPARINUNFRC  --   --   --  >1.10*  --   --   --  0.83*  CREATININE 0.87  --   --   --   --   --  0.65  --   TROPONINIHS 7 2  --   --   --   --   --   --     Estimated Creatinine Clearance: 47 mL/min (by C-G formula based on SCr of 0.65 mg/dL).   Assessment: 87yo female on Eliquis PTA for Afib and h/o PE c/o weakness, hypotension, and fatigue, found to have PE w/ small embolic burden despite Eliquis treatment >> to start heparin; last dose of Eliquis taken 4/28 pm.  Eliquis affecting heparin level. aPTT 70 sec (therapeutic) on gtt at 1150 units/hr. No issues with line or bleeding reported per RN.  INR 1.4   Goal of Therapy:  INR 2-3 Heparin level 0.3-0.7 units/ml aPTT 66-102 seconds Monitor platelets by anticoagulation protocol: Yes   Plan:  Continue IV heparin bridge to 1150 units/hr F/u 8hr aPTT Warfarin 7.22m x 1   GThomasenia Sales PharmD, MBA, BCGP Clinical Pharmacist  08/17/2020 10:16 AM

## 2020-08-17 NOTE — Evaluation (Signed)
Physical Therapy Evaluation Patient Details Name: Cindy [name] MRN: [id number] DOB: 04-Oct-1932 Today's Date: 08/17/2020   History of Present Illness  Cindy [name]  is a 87 y.o. female, with history of thyroid disease, paroxysmal atrial fibrillation, GERD, fibrocystic  , CHF, and warm presents the ED with a chief complaint of dyspnea.  Patient reports that she had gradual onset of dyspnea, and exact onset is unknown.  She reports that on 28 April it was acutely worsening.  She had associated fatigue and weakness that she reports was "so bad I could not put 1 foot in front of the other."  She also has associated left-sided chest pain that is like a dull ache.  The chest pain started 2 weeks ago and has been constant.  It is worse with position changes, better with stillness.  She reports when she had her last PE the pain was much worse and was stabbing.  She has not had a cough or fever.  She does report occasional nausea and decreased appetite that is been going on for more than 4 months.  She also has intermittent dysuria and reports that she has been on amoxicillin Keflex and nitrofurantoin recently for UTI.  Urine culture pending.  Patient denies any symptoms at this time.     Patient did have an acute PE in June 2021.  She has been on Eliquis since that time.  She reports compliance with her Eliquis.  She reports that at that time she was found to have a DVT in her left lower extremity, and that the swelling in her left lower extremity has never gone down since then   Clinical Impression   Overall, patient performing quite well and able to demonstrate independent to modified independent for functional mobility and demonstrating Good dynamic balance with main deficits being decreased endurance for walking requiring frequent standing rest periods due to exertion but maintaining stable vital signs of 94% O2 sat on room air and HR from 86-100 bpm during functional tasks. Good safety awareness  appreciated and only requiring occasional touch support for steadying during balance assessment. Pt reports she has requisite DME at home and has a supportive son that lives nearby who assists with housekeeping and shopping needs    Follow Up Recommendations Outpatient PT    Equipment Recommendations  None recommended by PT    Recommendations for Other Services Other (comment) (pt may find some benefit to outpatient therapy for general conditioning to improve functional activity tolerance)     Precautions / Restrictions        Mobility  Bed Mobility Overal bed mobility: Independent               Patient Response: Cooperative  Transfers Overall transfer level: Independent                  Ambulation/Gait Ambulation/Gait assistance: Modified independent (Device/Increase time) Gait Distance (Feet): 200 Feet Assistive device: Rolling walker (2 wheeled) Gait Pattern/deviations: WFL(Within Functional Limits) Gait velocity: decreased, slow but steady   General Gait Details: exhibits some fatigue with longer distance of walking but vital signs WNL  Stairs Stairs: Yes Stairs assistance: Modified independent (Device/Increase time) Stair Management: No rails Number of Stairs: 1    Wheelchair Mobility    Modified Rankin (Stroke Patients Only)       Balance Overall balance assessment: Modified Independent  Pertinent Vitals/Pain Pain Assessment: No/denies pain    Home Living Family/patient expects to be discharged to:: Private residence Living Arrangements: Spouse/significant other Available Help at Discharge: Family Type of Home: House Home Access: Stairs to enter Entrance Stairs-Rails: None Entrance Stairs-Number of Steps: 1 Home Layout: One level Home Equipment: Environmental consultant - 2 wheels;Cane - single point Additional Comments: Home O2 prn    Prior Function Level of Independence: Independent          Comments: pt reports occasional use of RW or cane if she feels weak  in the AM     Hand Dominance        Extremity/Trunk Assessment                Communication   Communication: No difficulties  Cognition Arousal/Alertness: Awake/alert Behavior During Therapy: WFL for tasks assessed/performed Overall Cognitive Status: Within Functional Limits for tasks assessed                                        General Comments General comments (skin integrity, edema, etc.): occasional touch support for balance, no LOB or unsteadiness noted    Exercises     Assessment/Plan    PT Assessment All further PT needs can be met in the next venue of care  PT Problem List Decreased strength;Decreased activity tolerance;Cardiopulmonary status limiting activity       PT Treatment Interventions      PT Goals (Current goals can be found in the Care Plan section)  Acute Rehab PT Goals Patient Stated Goal: "Return home when I'm able" PT Goal Formulation: With patient Time For Goal Achievement: 08/19/20 Potential to Achieve Goals: Good    Frequency     Barriers to discharge        Co-evaluation               AM-PAC PT "6 Clicks" Mobility  Outcome Measure Help needed turning from your back to your side while in a flat bed without using bedrails?: None Help needed moving from lying on your back to sitting on the side of a flat bed without using bedrails?: None Help needed moving to and from a bed to a chair (including a wheelchair)?: None Help needed standing up from a chair using your arms (e.g., wheelchair or bedside chair)?: None Help needed to walk in hospital room?: None Help needed climbing 3-5 steps with a railing? : None 6 Click Score: 24    End of Session Equipment Utilized During Treatment: Gait belt Activity Tolerance: Patient tolerated treatment well Patient left: in bed;with call bell/phone within reach Nurse Communication: Mobility  status PT Visit Diagnosis: Muscle weakness (generalized) (M62.81);Other abnormalities of gait and mobility (R26.89)    Time: 0830-0900 PT Time Calculation (min) (ACUTE ONLY): 30 min   Charges:   PT Evaluation $PT Eval Low Complexity: 1 Low PT Treatments $Gait Training: 8-22 mins       10:42 AM, 08/17/20 M. Sherlyn Lees, PT, DPT Physical Therapist- [place] Office Number: [phone]

## 2020-08-17 NOTE — Progress Notes (Signed)
PROGRESS NOTE    Cindy [name]  UEA:[id number] DOB: Nov 14, 1932 DOA: 08/15/2020 PCP: Celene Squibb, MD    Brief Narrative:  87 year old female with history of atrial fibrillation on anticoagulation, prior PE, amyloidosis, admitted to the hospital with chest pain or shortness of breath.  Found to have acute pulmonary embolism despite taking full dose Eliquis.  She has been transitioned to heparin/Coumadin.   Assessment & Plan:   Principal Problem:   Acute pulmonary embolism (HCC) Active Problems:   Hypothyroidism   Hyperlipidemia   Cardiac amyloidosis (HCC)   Chronic diastolic heart failure (HCC)   Paroxysmal atrial fibrillation (HCC)   Acute respiratory failure with hypoxia (HCC)    Acute pulmonary embolism -recurrent VTE, last PE was in 06/2019 -Patient had been compliant with full dose Eliquis -Discussed with Dr. Delton Coombes who recommended transitioning patient to heparin/Coumadin vs. longterm lovenox -Currently on heparin bridge with coumadin -Follow INR -Venous Dopplers negative for DVT in lower extremities bilaterally -Echocardiogram shows grade 2 diastolic dysfunction, normal EF, moderately elevated pulmonary artery pressure -patient wishes to discuss with her family regarding lovenox vs. Coumadin and asked me to call her son tomorrow -currently INR 1.4  Acute respiratory failure with hypoxia -Oxygen sats down to 83% on room air -currently on 2L [place] -Continue supplemental oxygen and wean off as tolerated  Atrial fibrillation -Continue on metoprolol -She is anticoagulated  Hypothyroidism -Continue on Synthroid  Light chain (AL) amyloidosis -continue follow up with hematology at [facility]   DVT prophylaxis: heparin/coumadin  Code Status: full code Family Communication: discussed with patient Disposition Plan: Status is: Inpatient  Remains inpatient appropriate because:Inpatient level of care appropriate due to severity of illness   Dispo: The patient is  from: Home              Anticipated d/c is to: Home              Patient currently is not medically stable to d/c.   Difficult to place patient No     Consultants:     Procedures:   Echo: 1. Left ventricular ejection fraction, by estimation, is 65 to 70%. The  left ventricle has normal function. The left ventricle has no regional  wall motion abnormalities. Left ventricular diastolic parameters are  consistent with Grade II diastolic  dysfunction (pseudonormalization).  2. Right ventricular systolic function is normal. The right ventricular  size is normal. There is moderately elevated pulmonary artery systolic  pressure. The estimated right ventricular systolic pressure is 47.8 mmHg.  3. The mitral valve is grossly normal. Mild mitral valve regurgitation.  4. Tricuspid valve regurgitation is moderate.  5. The aortic valve is tricuspid. Aortic valve regurgitation is not  visualized.  6. The inferior vena cava is normal in size with <50% respiratory  variability, suggesting right atrial pressure of 8 mmHg.   Antimicrobials:       Subjective: Continues to have some left-sided chest discomfort, worse with deep inspiration.  She reports some cough with hemoptysis earlier today.  Objective: Vitals:   08/17/20 0420 08/17/20 1034 08/17/20 1038 08/17/20 1437  BP: 105/61  105/63 116/60  Pulse: 81  87 75  Resp: 16   16  Temp: 98 F (36.7 C)   98.9 F (37.2 C)  TempSrc:    Oral  SpO2: 94% 94%  93%  Weight:      Height:        Intake/Output Summary (Last 24 hours) at 08/17/2020 1845 Last data filed at 08/17/2020 1800  Gross per 24 hour  Intake 1240.25 ml  Output 202 ml  Net 1038.25 ml   Filed Weights   08/15/20 2250 08/16/20 0642  Weight: 56.2 kg 57.9 kg    Examination:  General exam: Appears calm and comfortable  Respiratory system: Clear to auscultation. Respiratory effort normal. Cardiovascular system: S1 & S2 heard, RRR. No JVD, murmurs, rubs, gallops or  clicks. No pedal edema. Gastrointestinal system: Abdomen is nondistended, soft and nontender. No organomegaly or masses felt. Normal bowel sounds heard. Central nervous system: Alert and oriented. No focal neurological deficits. Extremities: Symmetric 5 x 5 power. Skin: No rashes, lesions or ulcers Psychiatry: Judgement and insight appear normal. Mood & affect appropriate.     Data Reviewed: I have personally reviewed following labs and imaging studies  CBC: Recent Labs  Lab 08/16/20 0038  WBC 13.0*  NEUTROABS 11.3*  HGB 12.3  HCT 37.8  MCV 97.2  PLT 938   Basic Metabolic Panel: Recent Labs  Lab 08/16/20 0038 08/17/20 0554  NA 132* 130*  K 3.9 3.5  CL 97* 99  CO2 26 25  GLUCOSE 136* 89  BUN 23 17  CREATININE 0.87 0.65  CALCIUM 8.4* 8.3*  MG  --  1.8   GFR: Estimated Creatinine Clearance: 47 mL/min (by C-G formula based on SCr of 0.65 mg/dL). Liver Function Tests: Recent Labs  Lab 08/16/20 0038 08/17/20 0554  AST 22 15  ALT 12 10  ALKPHOS 88 69  BILITOT 1.1 1.3*  PROT 6.1* 5.2*  ALBUMIN 3.3* 2.8*   No results for input(s): LIPASE, AMYLASE in the last 168 hours. No results for input(s): AMMONIA in the last 168 hours. Coagulation Profile: Recent Labs  Lab 08/16/20 0530 08/17/20 0824  INR 1.4* 1.4*   Cardiac Enzymes: No results for input(s): CKTOTAL, CKMB, CKMBINDEX, TROPONINI in the last 168 hours. BNP (last 3 results) No results for input(s): PROBNP in the last 8760 hours. HbA1C: No results for input(s): HGBA1C in the last 72 hours. CBG: No results for input(s): GLUCAP in the last 168 hours. Lipid Profile: No results for input(s): CHOL, HDL, LDLCALC, TRIG, CHOLHDL, LDLDIRECT in the last 72 hours. Thyroid Function Tests: No results for input(s): TSH, T4TOTAL, FREET4, T3FREE, THYROIDAB in the last 72 hours. Anemia Panel: No results for input(s): VITAMINB12, FOLATE, FERRITIN, TIBC, IRON, RETICCTPCT in the last 72 hours. Sepsis Labs: No results for  input(s): PROCALCITON, LATICACIDVEN in the last 168 hours.  Recent Results (from the past 240 hour(s))  SARS CORONAVIRUS 2 (TAT 6-24 HRS) Nasopharyngeal Nasopharyngeal Swab     Status: None   Collection Time: 08/16/20  5:51 AM   Specimen: Nasopharyngeal Swab  Result Value Ref Range Status   SARS Coronavirus 2 NEGATIVE NEGATIVE Final    Comment: (NOTE) SARS-CoV-2 target nucleic acids are NOT DETECTED.  The SARS-CoV-2 RNA is generally detectable in upper and lower respiratory specimens during the acute phase of infection. Negative results do not preclude SARS-CoV-2 infection, do not rule out co-infections with other pathogens, and should not be used as the sole basis for treatment or other patient management decisions. Negative results must be combined with clinical observations, patient history, and epidemiological information. The expected result is Negative.  Fact Sheet for Patients: SugarRoll.be  Fact Sheet for Healthcare Providers: https://www.woods-mathews.com/  This test is not yet approved or cleared by the [place] FDA and  has been authorized for detection and/or diagnosis of SARS-CoV-2 by FDA under an Emergency Use Authorization (EUA). This EUA will remain  in effect (meaning this test can be used) for the duration of the COVID-19 declaration under Se ction 564(b)(1) of the Act, 21 U.S.C. section 360bbb-3(b)(1), unless the authorization is terminated or revoked sooner.  Performed at [facility], [place] [address], [place], [place] [address]          Radiology Studies: CT Angio Chest PE W and/or Wo Contrast  Result Date: 08/16/2020 CLINICAL DATA:  Amyloidosis, pulmonary embolism, dyspnea EXAM: CT ANGIOGRAPHY CHEST WITH CONTRAST TECHNIQUE: Multidetector CT imaging of the chest was performed using the standard protocol during bolus administration of intravenous contrast. Multiplanar CT image reconstructions and MIPs  were obtained to evaluate the vascular anatomy. CONTRAST:  126m OMNIPAQUE IOHEXOL 350 MG/ML SOLN COMPARISON:  07/08/2019 FINDINGS: Cardiovascular: There is a branching intraluminal filling defect identified within the right upper lobe and right middle lobe pulmonary arteries which, while similar in location to prior examination, demonstrates new involvement of the right middle lobe pulmonary artery and demonstrates a central, as opposed to eccentric filling defect and is most compatible with an acute pulmonary embolism. The overall embolic burden is small. The central pulmonary arteries are enlarged, similar to prior examination, in keeping with changes of pulmonary arterial hypertension. Mild global cardiomegaly. Left subclavian pacemaker leads are seen within the a right atrium and right ventricle. No pericardial effusion. Minimal coronary artery calcification. Mild atherosclerotic calcification within the thoracic aorta. No aortic aneurysm. Mediastinum/Nodes: Shotty mediastinal and bilateral hilar adenopathy is again identified without frank pathologic enlargement. Visualized thyroid is unremarkable. The esophagus is unremarkable. Lungs/Pleura: Similar prior examination, there is diffuse septal thickening as well as areas of peripheral micronodularity and new developing basilar, peripheral consolidation. Given the patient's history, this may represent progressive changes of pulmonary amyloidosis. Small right pleural effusion again noted. No pneumothorax. No central obstructing lesion. Upper Abdomen: Simple cyst within the left hepatic lobe. No acute abnormality. Musculoskeletal: No acute bone abnormality. No lytic or blastic bone lesions. Review of the MIP images confirms the above findings. IMPRESSION: Acute pulmonary embolism. The embolic burden is small. No CT evidence of right heart strain. Mild global cardiomegaly. Morphologic changes in keeping with pulmonary arterial hypertension. Constellation of  pulmonary findings including slightly irregular septal thickening, micronodularity, peripheral reticulation, and now progressive basilar, peripheral consolidation. Given the patient's history, these findings are highly suspicious for progressive pulmonary emboli ptosis. Attempts are being made at this time to contact the managing clinician for direct verbal communication. Electronically Signed   By: AFidela SalisburyMD   On: 08/16/2020 04:20   UK[place]Venous Img Lower Bilateral (DVT)  Result Date: 08/16/2020 CLINICAL DATA:  Bilateral lower extremity pain and edema, left greater than right. Evaluate for DVT. EXAM: BILATERAL LOWER EXTREMITY VENOUS DOPPLER ULTRASOUND TECHNIQUE: Gray-scale sonography with graded compression, as well as color Doppler and duplex ultrasound were performed to evaluate the lower extremity deep venous systems from the level of the common femoral vein and including the common femoral, femoral, profunda femoral, popliteal and calf veins including the posterior tibial, peroneal and gastrocnemius veins when visible. The superficial great saphenous vein was also interrogated. Spectral Doppler was utilized to evaluate flow at rest and with distal augmentation maneuvers in the common femoral, femoral and popliteal veins. COMPARISON:  Right lower extremity venous Doppler ultrasound-02/09/2013 (negative). FINDINGS: RIGHT LOWER EXTREMITY Common Femoral Vein: No evidence of thrombus. Normal compressibility, respiratory phasicity and response to augmentation. Saphenofemoral Junction: No evidence of thrombus. Normal compressibility and flow on color Doppler imaging. Profunda Femoral Vein: No evidence  of thrombus. Normal compressibility and flow on color Doppler imaging. Femoral Vein: No evidence of thrombus. Normal compressibility, respiratory phasicity and response to augmentation. Popliteal Vein: No evidence of thrombus. Normal compressibility, respiratory phasicity and response to augmentation. Calf  Veins: No evidence of thrombus. Normal compressibility and flow on color Doppler imaging. Superficial Great Saphenous Vein: No evidence of thrombus. Normal compressibility. Venous Reflux:  None. Other Findings:  None. LEFT LOWER EXTREMITY Common Femoral Vein: No evidence of thrombus. Normal compressibility, respiratory phasicity and response to augmentation. Saphenofemoral Junction: No evidence of thrombus. Normal compressibility and flow on color Doppler imaging. Profunda Femoral Vein: No evidence of thrombus. Normal compressibility and flow on color Doppler imaging. Femoral Vein: No evidence of thrombus. Normal compressibility, respiratory phasicity and response to augmentation. Popliteal Vein: No evidence of thrombus. Normal compressibility, respiratory phasicity and response to augmentation. Calf Veins: No evidence of thrombus. Normal compressibility and flow on color Doppler imaging. Superficial Great Saphenous Vein: No evidence of thrombus. Normal compressibility. Venous Reflux:  None. Other Findings:  None. IMPRESSION: No evidence of DVT within either lower extremity. Electronically Signed   By: Sandi Mariscal M.D.   On: 08/16/2020 12:30   DG Chest Port 1 View  Result Date: 08/16/2020 CLINICAL DATA:  Weakness. History of fibrosis of lung, edema, bone cancer. EXAM: PORTABLE CHEST 1 VIEW COMPARISON:  11/06/2019 FINDINGS: Cardiac pacemaker. Mild cardiac enlargement. Diffuse coarse interstitial changes in the lungs likely representing interstitial fibrosis. No change since prior study. No developing consolidation or edema. Blunting of the right costophrenic angle is also unchanged, likely pleural thickening. Calcification of the aorta. Mediastinal contours appear intact. IMPRESSION: Chronic interstitial fibrosis in the lungs. No evidence of active pulmonary disease. Electronically Signed   By: Lucienne Capers M.D.   On: 08/16/2020 00:50   ECHOCARDIOGRAM COMPLETE  Result Date: 08/16/2020    ECHOCARDIOGRAM  REPORT   Patient Name:   Cindy [name] Date of Exam: 08/16/2020 Medical Rec #:  [id number]          Height:       68.0 in Accession #:    [id number]         Weight:       127.6 lb Date of Birth:  1932-07-27          BSA:          1.689 m Patient Age:    35 years           BP:           111/73 mmHg Patient Gender: F                  HR:           84 bpm. Exam Location:  [facility] Procedure: 2D Echo, Cardiac Doppler and Color Doppler Indications:    Pulmonary embolus  History:        Patient has prior history of Echocardiogram examinations, most                 recent 04/28/2019. CHF, Pacemaker, Arrythmias:Atrial Fibrillation;                 Signs/Symptoms:Dyspnea, Chest Pain and Pulmonary embolus.                 Fibrosis of lung.  Sonographer:    Dustin Flock RDCS Referring Phys: [id number] ASIA [place]  1. Left ventricular ejection fraction, by estimation, is 65 to 70%. The left ventricle has normal function.  The left ventricle has no regional wall motion abnormalities. Left ventricular diastolic parameters are consistent with Grade II diastolic dysfunction (pseudonormalization).  2. Right ventricular systolic function is normal. The right ventricular size is normal. There is moderately elevated pulmonary artery systolic pressure. The estimated right ventricular systolic pressure is 26.3 mmHg.  3. The mitral valve is grossly normal. Mild mitral valve regurgitation.  4. Tricuspid valve regurgitation is moderate.  5. The aortic valve is tricuspid. Aortic valve regurgitation is not visualized.  6. The inferior vena cava is normal in size with <50% respiratory variability, suggesting right atrial pressure of 8 mmHg. FINDINGS  Left Ventricle: Left ventricular ejection fraction, by estimation, is 65 to 70%. The left ventricle has normal function. The left ventricle has no regional wall motion abnormalities. The left ventricular internal cavity size was normal in size. There is  no left ventricular  hypertrophy. Left ventricular diastolic parameters are consistent with Grade II diastolic dysfunction (pseudonormalization). Right Ventricle: The right ventricular size is normal. No increase in right ventricular wall thickness. Right ventricular systolic function is normal. There is moderately elevated pulmonary artery systolic pressure. The tricuspid regurgitant velocity is 3.22 m/s, and with an assumed right atrial pressure of 8 mmHg, the estimated right ventricular systolic pressure is 78.5 mmHg. Left Atrium: Left atrial size was normal in size. Right Atrium: Right atrial size was normal in size. Pericardium: There is no evidence of pericardial effusion. Mitral Valve: The mitral valve is grossly normal. Mild mitral valve regurgitation. Tricuspid Valve: The tricuspid valve is grossly normal. Tricuspid valve regurgitation is moderate. Aortic Valve: The aortic valve is tricuspid. There is mild aortic valve annular calcification. Aortic valve regurgitation is not visualized. Pulmonic Valve: The pulmonic valve was grossly normal. Pulmonic valve regurgitation is trivial. Aorta: The aortic root is normal in size and structure. Venous: The inferior vena cava is normal in size with less than 50% respiratory variability, suggesting right atrial pressure of 8 mmHg. IAS/Shunts: No atrial level shunt detected by color flow Doppler. Additional Comments: A device lead is visualized.  LEFT VENTRICLE PLAX 2D LVIDd:         4.06 cm  Diastology LVIDs:         2.12 cm  LV e' medial:    4.79 cm/s LV PW:         0.97 cm  LV E/e' medial:  19.7 LV IVS:        0.97 cm  LV e' lateral:   6.42 cm/s LVOT diam:     1.80 cm  LV E/e' lateral: 14.7 LV SV:         19 LV SV Index:   11 LVOT Area:     2.54 cm  RIGHT VENTRICLE RV S prime:     3.37 cm/s RVOT diam:      3.90 cm LEFT ATRIUM           Index       RIGHT ATRIUM           Index LA diam:      3.30 cm 1.95 cm/m  RA Area:     16.00 cm LA Vol (A4C): 42.4 ml 25.11 ml/m RA Volume:   44.70  ml  26.47 ml/m  AORTIC VALVE LVOT Vmax:   46.30 cm/s LVOT Vmean:  28.200 cm/s LVOT VTI:    0.076 m  AORTA Ao Root diam: 2.90 cm MITRAL VALVE               TRICUSPID  VALVE MV Area (PHT): 3.65 cm    TR Peak grad:   41.5 mmHg MV Decel Time: 208 msec    TR Vmax:        322.00 cm/s MV E velocity: 94.30 cm/s MV A velocity: 23.60 cm/s  SHUNTS MV E/A ratio:  4.00        Systemic VTI:  0.08 m                            Systemic Diam: 1.80 cm                            Pulmonic Diam: 3.90 cm Rozann Lesches MD Electronically signed by Rozann Lesches MD Signature Date/Time: 08/16/2020/3:54:04 PM    Final         Scheduled Meds: . fentaNYL  1 patch Transdermal Q72H  . levothyroxine  100 mcg Oral q morning  . metoprolol succinate  25 mg Oral Daily  . Warfarin - Pharmacist Dosing Inpatient   Does not apply q1600   Continuous Infusions: . heparin 1,150 Units/hr (08/17/20 1050)     LOS: 1 day    Time spent: 62mns    JKathie Dike MD [facility]   If 7PM-7AM, please contact night-coverage [email or web address]  08/17/2020, 6:45 PM

## 2020-08-17 NOTE — Progress Notes (Signed)
[place] for heparin/warfarin Indication: pulmonary embolus  Allergies  Allergen Reactions  . Nebivolol Hcl     Other reaction(s): Other (See Comments) Chest tightness,sob,cough,severe tiredness,confusion  . Codeine Nausea Only  . Sulfa Antibiotics Nausea Only  . Sulfonamide Derivatives Nausea Only    Patient Measurements: Height: 5' 8"  (172.7 cm) Weight: 57.9 kg (127 lb 10.3 oz) IBW/kg (Calculated) : 63.9  Vital Signs: Temp: 98.3 F (36.8 C) (04/29 2023) Temp Source: Oral (04/29 2023) BP: 103/61 (04/29 2023) Pulse Rate: 73 (04/29 2023)  Labs: Recent Labs    08/16/20 0038 08/16/20 0213 08/16/20 0530 08/16/20 1338 08/16/20 1340 08/17/20 0023  HGB 12.3  --   --   --   --   --   HCT 37.8  --   --   --   --   --   PLT 178  --   --   --   --   --   APTT  --   --   --   --  43* 61*  LABPROT  --   --  17.2*  --   --   --   INR  --   --  1.4*  --   --   --   HEPARINUNFRC  --   --   --  >1.10*  --   --   CREATININE 0.87  --   --   --   --   --   TROPONINIHS 7 2  --   --   --   --     Estimated Creatinine Clearance: 43.2 mL/min (by C-G formula based on SCr of 0.87 mg/dL).   Assessment: 87yo female on Eliquis PTA for Afib and h/o PE c/o weakness, hypotension, and fatigue, found to have PE w/ small embolic burden despite Eliquis treatment >> to start heparin; last dose of Eliquis taken 4/28 pm.  Eliquis affecting heparin level. aPTT 61 sec (subtherapeutic) on gtt at 1050 units/hr. No issues with line or bleeding reported per RN.  Goal of Therapy:  INR 2-3 Heparin level 0.3-0.7 units/ml aPTT 66-102 seconds Monitor platelets by anticoagulation protocol: Yes   Plan:  Rebolus heparin 800 units IV Increase IV heparin bridge to 1150 units/hr F/u 8hr aPTT  Sherlon Handing, PharmD, BCPS Please see [email or web address] for complete clinical pharmacist phone list 08/17/2020 1:18 AM

## 2020-08-18 LAB — CBC
HCT: 33.6 % — ABNORMAL LOW (ref 36.0–46.0)
Hemoglobin: 10.9 g/dL — ABNORMAL LOW (ref 12.0–15.0)
MCH: 31.6 pg (ref 26.0–34.0)
MCHC: 32.4 g/dL (ref 30.0–36.0)
MCV: 97.4 fL (ref 80.0–100.0)
Platelets: 155 10*3/uL (ref 150–400)
RBC: 3.45 MIL/uL — ABNORMAL LOW (ref 3.87–5.11)
RDW: 13.8 % (ref 11.5–15.5)
WBC: 6.7 10*3/uL (ref 4.0–10.5)
nRBC: 0 % (ref 0.0–0.2)

## 2020-08-18 LAB — APTT: aPTT: 109 seconds — ABNORMAL HIGH (ref 24–36)

## 2020-08-18 LAB — HEPARIN LEVEL (UNFRACTIONATED): Heparin Unfractionated: 0.41 IU/mL (ref 0.30–0.70)

## 2020-08-18 LAB — PROTIME-INR
INR: 1.3 — ABNORMAL HIGH (ref 0.8–1.2)
Prothrombin Time: 16.3 seconds — ABNORMAL HIGH (ref 11.4–15.2)

## 2020-08-18 MED ORDER — WARFARIN SODIUM 7.5 MG PO TABS
7.5000 mg | ORAL_TABLET | Freq: Once | ORAL | Status: AC
  Administered 2020-08-18: 7.5 mg via ORAL
  Filled 2020-08-18: qty 1

## 2020-08-18 MED ORDER — DOCUSATE SODIUM 100 MG PO CAPS
100.0000 mg | ORAL_CAPSULE | Freq: Every day | ORAL | Status: DC | PRN
  Administered 2020-08-19: 100 mg via ORAL
  Filled 2020-08-18: qty 1

## 2020-08-18 NOTE — Progress Notes (Signed)
[place] for heparin/warfarin Indication: pulmonary embolus  Allergies  Allergen Reactions  . Nebivolol Hcl     Other reaction(s): Other (See Comments) Chest tightness,sob,cough,severe tiredness,confusion  . Codeine Nausea Only  . Sulfa Antibiotics Nausea Only  . Sulfonamide Derivatives Nausea Only    Patient Measurements: Height: 5' 8"  (172.7 cm) Weight: 57.9 kg (127 lb 10.3 oz) IBW/kg (Calculated) : 63.9  Vital Signs: Temp: 98.1 F (36.7 C) (05/01 0523) BP: 112/73 (05/01 1042) Pulse Rate: 89 (05/01 1042)  Labs: Recent Labs    08/16/20 0038 08/16/20 0213 08/16/20 0530 08/16/20 1338 08/17/20 0554 08/17/20 0824 08/17/20 1551 08/18/20 0310 08/18/20 0317  HGB 12.3  --   --   --   --   --   --  10.9*  --   HCT 37.8  --   --   --   --   --   --  33.6*  --   PLT 178  --   --   --   --   --   --  155  --   APTT  --   --   --    < >  --  70* 53* 109*  --   LABPROT  --   --  17.2*  --   --  17.6*  --  16.3*  --   INR  --   --  1.4*  --   --  1.4*  --  1.3*  --   HEPARINUNFRC  --   --   --    < >  --  0.83* 0.54  --  0.41  CREATININE 0.87  --   --   --  0.65  --   --   --   --   TROPONINIHS 7 2  --   --   --   --   --   --   --    < > = values in this interval not displayed.    Estimated Creatinine Clearance: 47 mL/min (by C-G formula based on SCr of 0.65 mg/dL).   Assessment: 87yo female on Eliquis PTA for Afib and h/o PE c/o weakness, hypotension, and fatigue, found to have PE w/ small embolic burden despite Eliquis treatment >> to start heparin; last dose of Eliquis taken 4/28 pm.  Eliquis was affecting heparin level, now correlating HL 0.41. aPTT 109 sec (supratherapeutic) on gtt at 1150 units/hr. No issues with line or bleeding reported per RN.  INR 1.3  Goal of Therapy:  INR 2-3 Heparin level 0.3-0.7 units/ml aPTT 66-102 seconds Monitor platelets by anticoagulation protocol: Yes   Plan:  Continue IV heparin bridge  to 1350 units/hr F/u 8hr aPTT Warfarin 7.57m x 1   GThomasenia Sales PharmD, MBA, BCGP Clinical Pharmacist  08/18/2020 11:41 AM

## 2020-08-18 NOTE — Progress Notes (Signed)
PROGRESS NOTE    Cindy [name]  TML:[id number] DOB: 1933/04/06 DOA: 08/15/2020 PCP: Celene Squibb, MD    Brief Narrative:  87 year old female with history of atrial fibrillation on anticoagulation, prior PE, amyloidosis, admitted to the hospital with chest pain or shortness of breath.  Found to have acute pulmonary embolism despite taking full dose Eliquis.  She has been transitioned to heparin/Coumadin.   Assessment & Plan:   Principal Problem:   Acute pulmonary embolism (HCC) Active Problems:   Hypothyroidism   Hyperlipidemia   Cardiac amyloidosis (HCC)   Chronic diastolic heart failure (HCC)   Paroxysmal atrial fibrillation (HCC)   Acute respiratory failure with hypoxia (HCC)    Acute pulmonary embolism -recurrent VTE, last PE was in 06/2019 -Patient had been compliant with full dose Eliquis -Discussed with Dr. Delton Coombes who recommended transitioning patient to heparin/Coumadin vs. longterm daily lovenox injections -Currently on heparin bridge with coumadin -Follow INR -Venous Dopplers negative for DVT in lower extremities bilaterally -Echocardiogram shows grade 2 diastolic dysfunction, normal EF, moderately elevated pulmonary artery pressure -Patient wishes to continue on Coumadin therapy long-term -She is willing to discharge home tomorrow with Coumadin and 5 days of Lovenox bridge -We will have to ensure with case management that this is a feasible option regarding cost to patient -She will also need to be set up with her primary care physician for close INR checks -She will be following up with her primary hematology group at [facility] later this month  Acute respiratory failure with hypoxia -Oxygen sats down to 83% on room air on admission -She was started on oxygen at 2 L nasal cannula -She has since been weaned off of oxygen and is currently breathing comfortably on room air  Atrial fibrillation -Continue on metoprolol -She is  anticoagulated  Hypothyroidism -Continue on Synthroid  Light chain (AL) amyloidosis -continue follow up with hematology at [facility]   DVT prophylaxis: heparin/coumadin  Code Status: full code Family Communication: discussed with patient Disposition Plan: Status is: Inpatient  Remains inpatient appropriate because:Inpatient level of care appropriate due to severity of illness   Dispo: The patient is from: Home              Anticipated d/c is to: Home              Patient currently is not medically stable to d/c.   Difficult to place patient No     Consultants:     Procedures:   Echo: 1. Left ventricular ejection fraction, by estimation, is 65 to 70%. The  left ventricle has normal function. The left ventricle has no regional  wall motion abnormalities. Left ventricular diastolic parameters are  consistent with Grade II diastolic  dysfunction (pseudonormalization).  2. Right ventricular systolic function is normal. The right ventricular  size is normal. There is moderately elevated pulmonary artery systolic  pressure. The estimated right ventricular systolic pressure is 68.1 mmHg.  3. The mitral valve is grossly normal. Mild mitral valve regurgitation.  4. Tricuspid valve regurgitation is moderate.  5. The aortic valve is tricuspid. Aortic valve regurgitation is not  visualized.  6. The inferior vena cava is normal in size with <50% respiratory  variability, suggesting right atrial pressure of 8 mmHg.   Antimicrobials:       Subjective: Had some pain in her left shoulder/neck muscles last night that improved after using a heating pad  Objective: Vitals:   08/17/20 2011 08/18/20 0523 08/18/20 1042 08/18/20 1458  BP: 117/72 116/73 112/73  119/62  Pulse: 73 87 89 74  Resp: 18 20  15   Temp: 97.9 F (36.6 C) 98.1 F (36.7 C)  98.4 F (36.9 C)  TempSrc:    Oral  SpO2: 92% 94%  93%  Weight:      Height:        Intake/Output Summary (Last 24 hours) at  08/18/2020 1803 Last data filed at 08/18/2020 1300 Gross per 24 hour  Intake 480 ml  Output --  Net 480 ml   Filed Weights   08/15/20 2250 08/16/20 0642  Weight: 56.2 kg 57.9 kg    Examination:  General exam: Alert, awake, oriented x 3 Respiratory system: Clear to auscultation. Respiratory effort normal. Cardiovascular system:RRR. No murmurs, rubs, gallops. Gastrointestinal system: Abdomen is nondistended, soft and nontender. No organomegaly or masses felt. Normal bowel sounds heard. Central nervous system: Alert and oriented. No focal neurological deficits. Extremities: No C/C/E, +pedal pulses Skin: No rashes, lesions or ulcers Psychiatry: Judgement and insight appear normal. Mood & affect appropriate.      Data Reviewed: I have personally reviewed following labs and imaging studies  CBC: Recent Labs  Lab 08/16/20 0038 08/18/20 0310  WBC 13.0* 6.7  NEUTROABS 11.3*  --   HGB 12.3 10.9*  HCT 37.8 33.6*  MCV 97.2 97.4  PLT 178 427   Basic Metabolic Panel: Recent Labs  Lab 08/16/20 0038 08/17/20 0554  NA 132* 130*  K 3.9 3.5  CL 97* 99  CO2 26 25  GLUCOSE 136* 89  BUN 23 17  CREATININE 0.87 0.65  CALCIUM 8.4* 8.3*  MG  --  1.8   GFR: Estimated Creatinine Clearance: 47 mL/min (by C-G formula based on SCr of 0.65 mg/dL). Liver Function Tests: Recent Labs  Lab 08/16/20 0038 08/17/20 0554  AST 22 15  ALT 12 10  ALKPHOS 88 69  BILITOT 1.1 1.3*  PROT 6.1* 5.2*  ALBUMIN 3.3* 2.8*   No results for input(s): LIPASE, AMYLASE in the last 168 hours. No results for input(s): AMMONIA in the last 168 hours. Coagulation Profile: Recent Labs  Lab 08/16/20 0530 08/17/20 0824 08/18/20 0310  INR 1.4* 1.4* 1.3*   Cardiac Enzymes: No results for input(s): CKTOTAL, CKMB, CKMBINDEX, TROPONINI in the last 168 hours. BNP (last 3 results) No results for input(s): PROBNP in the last 8760 hours. HbA1C: No results for input(s): HGBA1C in the last 72 hours. CBG: No  results for input(s): GLUCAP in the last 168 hours. Lipid Profile: No results for input(s): CHOL, HDL, LDLCALC, TRIG, CHOLHDL, LDLDIRECT in the last 72 hours. Thyroid Function Tests: No results for input(s): TSH, T4TOTAL, FREET4, T3FREE, THYROIDAB in the last 72 hours. Anemia Panel: No results for input(s): VITAMINB12, FOLATE, FERRITIN, TIBC, IRON, RETICCTPCT in the last 72 hours. Sepsis Labs: No results for input(s): PROCALCITON, LATICACIDVEN in the last 168 hours.  Recent Results (from the past 240 hour(s))  SARS CORONAVIRUS 2 (TAT 6-24 HRS) Nasopharyngeal Nasopharyngeal Swab     Status: None   Collection Time: 08/16/20  5:51 AM   Specimen: Nasopharyngeal Swab  Result Value Ref Range Status   SARS Coronavirus 2 NEGATIVE NEGATIVE Final    Comment: (NOTE) SARS-CoV-2 target nucleic acids are NOT DETECTED.  The SARS-CoV-2 RNA is generally detectable in upper and lower respiratory specimens during the acute phase of infection. Negative results do not preclude SARS-CoV-2 infection, do not rule out co-infections with other pathogens, and should not be used as the sole basis for treatment or other patient management  decisions. Negative results must be combined with clinical observations, patient history, and epidemiological information. The expected result is Negative.  Fact Sheet for Patients: SugarRoll.be  Fact Sheet for Healthcare Providers: https://www.woods-mathews.com/  This test is not yet approved or cleared by the [place] FDA and  has been authorized for detection and/or diagnosis of SARS-CoV-2 by FDA under an Emergency Use Authorization (EUA). This EUA will remain  in effect (meaning this test can be used) for the duration of the COVID-19 declaration under Se ction 564(b)(1) of the Act, 21 U.S.C. section 360bbb-3(b)(1), unless the authorization is terminated or revoked sooner.  Performed at [facility], [place]  [address], [place], [place] [address]          Radiology Studies: No results found.      Scheduled Meds: . fentaNYL  1 patch Transdermal Q72H  . levothyroxine  100 mcg Oral q morning  . metoprolol succinate  25 mg Oral Daily  . Warfarin - Pharmacist Dosing Inpatient   Does not apply q1600   Continuous Infusions: . heparin 1,350 Units/hr (08/18/20 0725)     LOS: 2 days    Time spent: 82mns    JKathie Dike MD [facility]   If 7PM-7AM, please contact night-coverage [email or web address]  08/18/2020, 6:03 PM

## 2020-08-19 DIAGNOSIS — E782 Mixed hyperlipidemia: Secondary | ICD-10-CM

## 2020-08-19 LAB — CBC
HCT: 33.7 % — ABNORMAL LOW (ref 36.0–46.0)
Hemoglobin: 11.1 g/dL — ABNORMAL LOW (ref 12.0–15.0)
MCH: 31.8 pg (ref 26.0–34.0)
MCHC: 32.9 g/dL (ref 30.0–36.0)
MCV: 96.6 fL (ref 80.0–100.0)
Platelets: 171 10*3/uL (ref 150–400)
RBC: 3.49 MIL/uL — ABNORMAL LOW (ref 3.87–5.11)
RDW: 13.7 % (ref 11.5–15.5)
WBC: 6.6 10*3/uL (ref 4.0–10.5)
nRBC: 0 % (ref 0.0–0.2)

## 2020-08-19 LAB — PROTIME-INR
INR: 2.2 — ABNORMAL HIGH (ref 0.8–1.2)
Prothrombin Time: 24.3 seconds — ABNORMAL HIGH (ref 11.4–15.2)

## 2020-08-19 LAB — HEPARIN LEVEL (UNFRACTIONATED): Heparin Unfractionated: 0.31 IU/mL (ref 0.30–0.70)

## 2020-08-19 MED ORDER — WARFARIN SODIUM 2.5 MG PO TABS: 2.5000 mg | ORAL_TABLET | Freq: Once | ORAL | 0 refills | Status: DC

## 2020-08-19 MED ORDER — WARFARIN SODIUM 2.5 MG PO TABS: 2.5000 mg | ORAL_TABLET | Freq: Once | ORAL | Status: DC

## 2020-08-19 NOTE — TOC Transition Note (Signed)
Transition of Care [facility]) - CM/SW Discharge Note   Patient Details  Name: DEVYNN HESSLER MRN: [id number] Date of Birth: Dec 24, 1932  Transition of Care [facility]) CM/SW Contact:  Boneta Lucks, RN Phone Number: 08/19/2020, 11:42 AM   Clinical Narrative:  Patient ready for discharge home. PT is recommending Home health. Patient is agreeable. MD is ordering PT/RN/SW. Patient has no preference. Vaughan Basta with [facility] accepted the referral. Added to AVS.     Final next level of care: [place] Barriers to Discharge: Barriers Resolved   Patient Goals and CMS Choice Patient states their goals for this hospitalization and ongoing recovery are:: to go home. CMS Medicare.gov Compare Post Acute Care list provided to:: Patient   Discharge Placement   Discharge Plan and Services    HH Arranged: RN,PT,Social Work [facility] Agency: [place] ([place]) Date [place]: 08/19/20 Time [place]: 1117 Representative spoke with at [place]: Romualdo Bolk   Readmission Risk Interventions Readmission Risk Prevention Plan 08/19/2020  Transportation Screening Complete  Home Care Screening Complete  Medication Review (RN CM) Complete  Some recent data might be hidden

## 2020-08-19 NOTE — Progress Notes (Signed)
[place] for heparin/warfarin Indication: pulmonary embolus  Allergies  Allergen Reactions  . Nebivolol Hcl     Other reaction(s): Other (See Comments) Chest tightness,sob,cough,severe tiredness,confusion  . Codeine Nausea Only  . Sulfa Antibiotics Nausea Only  . Sulfonamide Derivatives Nausea Only    Patient Measurements: Height: 5' 8"  (172.7 cm) Weight: 57.9 kg (127 lb 10.3 oz) IBW/kg (Calculated) : 63.9  Vital Signs: Temp: 97.6 F (36.4 C) (05/02 0518) BP: 103/67 (05/02 0518) Pulse Rate: 80 (05/02 0518)  Labs: Recent Labs    08/17/20 0554 08/17/20 0824 08/17/20 1551 08/18/20 0310 08/18/20 0317 08/19/20 0510  HGB  --   --   --  10.9*  --  11.1*  HCT  --   --   --  33.6*  --  33.7*  PLT  --   --   --  155  --  171  APTT  --  70* 53* 109*  --   --   LABPROT  --  17.6*  --  16.3*  --  24.3*  INR  --  1.4*  --  1.3*  --  2.2*  HEPARINUNFRC  --  0.83* 0.54  --  0.41 0.31  CREATININE 0.65  --   --   --   --   --     Estimated Creatinine Clearance: 47 mL/min (by C-G formula based on SCr of 0.65 mg/dL).   Assessment: 87yo female on Eliquis PTA for Afib and h/o PE c/o weakness, hypotension, and fatigue, found to have PE w/ small embolic burden despite Eliquis treatment >> to start heparin; last dose of Eliquis taken 4/28 pm.  HL 0.31 INR 1.3 > 2.2   Goal of Therapy:  INR 2-3 Heparin level 0.3-0.7 units/ml aPTT 66-102 seconds Monitor platelets by anticoagulation protocol: Yes   Plan:  Continue IV heparin bridge to 1350 units/hr Daily heparin level and CBC Warfarin 2.5 mg x 1 dose.  Margot Ables, PharmD Clinical Pharmacist 08/19/2020 7:58 AM

## 2020-08-19 NOTE — Care Management Important Message (Signed)
Important Message  Patient Details  Name: Cindy [name] MRN: [id number] Date of Birth: 1932-05-19   Medicare Important Message Given:  Yes     Tommy Medal 08/19/2020, 12:53 PM

## 2020-08-19 NOTE — Discharge Summary (Signed)
Physician Discharge Summary [facility]    Patient: Cindy [name]                   Admit date: 08/15/2020   DOB: [birth year]             Discharge date:08/19/2020/11:18 AM OIN:[id number]                          PCP: Celene Squibb, MD  Disposition: HOME with Home Health   Recommendations for Outpatient Follow-up:   . Follow up: With Dr. Nevada Crane, Coumadin clinic and 3-5 days, continue checking INR level goal 2-3, . NR today 2.2.Marland KitchenMarland Kitchen Coumadin dose needs to be adjusted to maintain goal INR  Discharge Condition: Stable   Code Status:   Code Status: Full Code  Diet recommendation: Regular healthy diet   Discharge Diagnoses:    Principal Problem:   Acute pulmonary embolism ([place]) Active Problems:   Hypothyroidism   Hyperlipidemia   Cardiac amyloidosis ([place])   Chronic diastolic heart failure (HCC)   Paroxysmal atrial fibrillation (HCC)   Acute respiratory failure with hypoxia ([place])   History of Present Illness/ Hospital Course Cindy [name] Summary:    87 year old female with history of atrial fibrillation on anticoagulation, prior PE, amyloidosis, admitted to the hospital with chest pain or shortness of breath.  Found to have acute pulmonary embolism despite taking full dose Eliquis.  She has been transitioned to heparin/Coumadin.    Acute pulmonary embolism -recurrent VTE, last PE was in 06/2019 -Patient had been compliant with full dose Eliquis  -Discussed with Dr. Delton Coombes who recommended transitioning patient to heparin/Coumadin vs. longterm daily lovenox injections -Currently on heparin bridge with coumadin... INR therapeutic 2.2 today, discontinue heparin -Follow INR in 3-5 days, maintaining INR goal 2.0- 3.0 -Venous Dopplers negative for DVT in lower extremities bilaterally -Echocardiogram shows grade 2 diastolic dysfunction, normal EF, moderately elevated pulmonary artery pressure -Patient wishes to continue on Coumadin therapy long-term -She is willing to  discharge home with Coumadin  -She will also need to be set up with her primary care physician for close INR checks -She will be following up with her primary hematology group at [facility] later this month  Acute respiratory failure with hypoxia -Oxygen sats down to 83%on room air on admission -She was started on oxygen at 2 L nasal cannula... We will wean down to room air, Once again before discharge we will assess for home O2 needs If needed will be arranged -She has since been weaned off of oxygen and is currently breathing comfortably on room air  Atrial fibrillation -Continue on metoprolol -She is anticoagulated  Hypothyroidism -Continue on Synthroid  Light chain (AL) amyloidosis -continue follow up with hematology at [facility]   Code Status: full code Family Communication: discussed with patient   Dispo: The patient is from: Home  Anticipated d/c is to: Home with Home Health     -------------------------------------------------------------------------------------------------------------------------------------  Consultants:   none   Procedures:   Echo: 1. Left ventricular ejection fraction, by estimation, is 65 to 70%. The  left ventricle has normal function. The left ventricle has no regional  wall motion abnormalities. Left ventricular diastolic parameters are  consistent with Grade II diastolic  dysfunction (pseudonormalization).  2. Right ventricular systolic function is normal. The right ventricular  size is normal. There is moderately elevated pulmonary artery systolic  pressure. The estimated right ventricular systolic pressure is 70.9 mmHg.  3. The mitral valve is grossly  normal. Mild mitral valve regurgitation.  4. Tricuspid valve regurgitation is moderate.  5. The aortic valve is tricuspid. Aortic valve regurgitation is not  visualized.  6. The inferior vena cava is normal in size with <50% respiratory  variability,  suggesting right atrial pressure of 8 mmHg.   Antimicrobials:    None       Discharge Instructions:   Discharge Instructions    AMB referral to pulmonary rehabilitation   Complete by: As directed    Please select a program: Respiratory Care Services   Respiratory Care Services Diagnosis: Dyspnea   After initial evaluation and assessments completed: Virtual Based Care may be provided alone or in conjunction with Pulmonary Rehab/Respiratory Care services based on patient barriers.: Yes   Activity as tolerated - No restrictions   Complete by: As directed    Call MD for:   Complete by: As directed    Please check INR level every 3-5 days, INR goal 2- 3 (Note it seems patient failed Eliquis with recurrent PE was started on Coumadin   Call MD for:  difficulty breathing, headache or visual disturbances   Complete by: As directed    Diet - low sodium heart healthy   Complete by: As directed    Discharge instructions   Complete by: As directed    Continue checking your INR every 3-7 days, INR goal 2-3 Coumadin dose may change to maintain INR 2-3 range   Increase activity slowly   Complete by: As directed        Medication List    STOP taking these medications   apixaban 5 MG Tabs tablet Commonly known as: ELIQUIS     TAKE these medications   acetaminophen 500 MG tablet Commonly known as: TYLENOL Take 1,000 mg by mouth as needed.   albuterol (2.5 MG/3ML) 0.083% nebulizer solution Commonly known as: PROVENTIL Take 3 mLs by nebulization 4 (four) times daily as needed.   ALPRAZolam 0.5 MG tablet Commonly known as: Xanax Take 1 tablet (0.5 mg total) by mouth at bedtime as needed for anxiety.   calcium carbonate 500 MG chewable tablet Commonly known as: TUMS - dosed in mg elemental calcium Chew 1 tablet by mouth daily.   esomeprazole 40 MG capsule Commonly known as: NEXIUM Take 1 capsule by mouth every evening.   fentaNYL 25 MCG/HR Commonly known as:  [place] 1 patch onto the skin every 3 (three) days.   levothyroxine 112 MCG tablet Commonly known as: SYNTHROID Take 112 mcg by mouth daily.   metoprolol succinate 25 MG 24 hr tablet Commonly known as: TOPROL-XL Take 0.5-1 tablets (12.5-25 mg total) by mouth in the morning and at bedtime. Patient takes 1 tablet (35m) in the morning and 1/2 of a tablet (12.515m in the evening   nitrofurantoin (macrocrystal-monohydrate) 100 MG capsule Commonly known as: MACROBID Take 100 mg by mouth 2 (two) times daily.   ondansetron 4 MG tablet Commonly known as: ZOFRAN Take 4 mg by mouth every 8 (eight) hours as needed.   potassium chloride SA 20 MEQ tablet Commonly known as: KLOR-CON Take 20 mEq by mouth daily.   senna 8.6 MG tablet Commonly known as: SENOKOT Take 2 tablets by mouth as needed for constipation.   sodium chloride 0.65 % nasal spray Commonly known as: OCEAN Place 2 sprays into the nose daily as needed.   SYSTANE OP Apply 1 drop to eye daily.   torsemide 20 MG tablet Commonly known as: DEMADEX Take 20 mg by mouth  daily.   Vision Vitamins Tabs Take 1 tablet by mouth daily.   warfarin 2.5 MG tablet Commonly known as: COUMADIN Take 1 tablet (2.5 mg total) by mouth one time only at 4 PM.       Allergies  Allergen Reactions  . Nebivolol Hcl     Other reaction(s): Other (See Comments) Chest tightness,sob,cough,severe tiredness,confusion  . Codeine Nausea Only  . Sulfa Antibiotics Nausea Only  . Sulfonamide Derivatives Nausea Only     Procedures /Studies:   CT Angio Chest PE W and/or Wo Contrast  Result Date: 08/16/2020 CLINICAL DATA:  Amyloidosis, pulmonary embolism, dyspnea EXAM: CT ANGIOGRAPHY CHEST WITH CONTRAST TECHNIQUE: Multidetector CT imaging of the chest was performed using the standard protocol during bolus administration of intravenous contrast. Multiplanar CT image reconstructions and MIPs were obtained to evaluate the vascular anatomy.  CONTRAST:  168m OMNIPAQUE IOHEXOL 350 MG/ML SOLN COMPARISON:  07/08/2019 FINDINGS: Cardiovascular: There is a branching intraluminal filling defect identified within the right upper lobe and right middle lobe pulmonary arteries which, while similar in location to prior examination, demonstrates new involvement of the right middle lobe pulmonary artery and demonstrates a central, as opposed to eccentric filling defect and is most compatible with an acute pulmonary embolism. The overall embolic burden is small. The central pulmonary arteries are enlarged, similar to prior examination, in keeping with changes of pulmonary arterial hypertension. Mild global cardiomegaly. Left subclavian pacemaker leads are seen within the a right atrium and right ventricle. No pericardial effusion. Minimal coronary artery calcification. Mild atherosclerotic calcification within the thoracic aorta. No aortic aneurysm. Mediastinum/Nodes: Shotty mediastinal and bilateral hilar adenopathy is again identified without frank pathologic enlargement. Visualized thyroid is unremarkable. The esophagus is unremarkable. Lungs/Pleura: Similar prior examination, there is diffuse septal thickening as well as areas of peripheral micronodularity and new developing basilar, peripheral consolidation. Given the patient's history, this may represent progressive changes of pulmonary amyloidosis. Small right pleural effusion again noted. No pneumothorax. No central obstructing lesion. Upper Abdomen: Simple cyst within the left hepatic lobe. No acute abnormality. Musculoskeletal: No acute bone abnormality. No lytic or blastic bone lesions. Review of the MIP images confirms the above findings. IMPRESSION: Acute pulmonary embolism. The embolic burden is small. No CT evidence of right heart strain. Mild global cardiomegaly. Morphologic changes in keeping with pulmonary arterial hypertension. Constellation of pulmonary findings including slightly irregular septal  thickening, micronodularity, peripheral reticulation, and now progressive basilar, peripheral consolidation. Given the patient's history, these findings are highly suspicious for progressive pulmonary emboli ptosis. Attempts are being made at this time to contact the managing clinician for direct verbal communication. Electronically Signed   By: AFidela SalisburyMD   On: 08/16/2020 04:20   UK[place]Venous Img Lower Bilateral (DVT)  Result Date: 08/16/2020 CLINICAL DATA:  Bilateral lower extremity pain and edema, left greater than right. Evaluate for DVT. EXAM: BILATERAL LOWER EXTREMITY VENOUS DOPPLER ULTRASOUND TECHNIQUE: Gray-scale sonography with graded compression, as well as color Doppler and duplex ultrasound were performed to evaluate the lower extremity deep venous systems from the level of the common femoral vein and including the common femoral, femoral, profunda femoral, popliteal and calf veins including the posterior tibial, peroneal and gastrocnemius veins when visible. The superficial great saphenous vein was also interrogated. Spectral Doppler was utilized to evaluate flow at rest and with distal augmentation maneuvers in the common femoral, femoral and popliteal veins. COMPARISON:  Right lower extremity venous Doppler ultrasound-02/09/2013 (negative). FINDINGS: RIGHT LOWER EXTREMITY Common Femoral Vein: No evidence of  thrombus. Normal compressibility, respiratory phasicity and response to augmentation. Saphenofemoral Junction: No evidence of thrombus. Normal compressibility and flow on color Doppler imaging. Profunda Femoral Vein: No evidence of thrombus. Normal compressibility and flow on color Doppler imaging. Femoral Vein: No evidence of thrombus. Normal compressibility, respiratory phasicity and response to augmentation. Popliteal Vein: No evidence of thrombus. Normal compressibility, respiratory phasicity and response to augmentation. Calf Veins: No evidence of thrombus. Normal compressibility and  flow on color Doppler imaging. Superficial Great Saphenous Vein: No evidence of thrombus. Normal compressibility. Venous Reflux:  None. Other Findings:  None. LEFT LOWER EXTREMITY Common Femoral Vein: No evidence of thrombus. Normal compressibility, respiratory phasicity and response to augmentation. Saphenofemoral Junction: No evidence of thrombus. Normal compressibility and flow on color Doppler imaging. Profunda Femoral Vein: No evidence of thrombus. Normal compressibility and flow on color Doppler imaging. Femoral Vein: No evidence of thrombus. Normal compressibility, respiratory phasicity and response to augmentation. Popliteal Vein: No evidence of thrombus. Normal compressibility, respiratory phasicity and response to augmentation. Calf Veins: No evidence of thrombus. Normal compressibility and flow on color Doppler imaging. Superficial Great Saphenous Vein: No evidence of thrombus. Normal compressibility. Venous Reflux:  None. Other Findings:  None. IMPRESSION: No evidence of DVT within either lower extremity. Electronically Signed   By: Sandi Mariscal M.D.   On: 08/16/2020 12:30   DG Chest Port 1 View  Result Date: 08/16/2020 CLINICAL DATA:  Weakness. History of fibrosis of lung, edema, bone cancer. EXAM: PORTABLE CHEST 1 VIEW COMPARISON:  11/06/2019 FINDINGS: Cardiac pacemaker. Mild cardiac enlargement. Diffuse coarse interstitial changes in the lungs likely representing interstitial fibrosis. No change since prior study. No developing consolidation or edema. Blunting of the right costophrenic angle is also unchanged, likely pleural thickening. Calcification of the aorta. Mediastinal contours appear intact. IMPRESSION: Chronic interstitial fibrosis in the lungs. No evidence of active pulmonary disease. Electronically Signed   By: Lucienne Capers M.D.   On: 08/16/2020 00:50   ECHOCARDIOGRAM COMPLETE  Result Date: 08/16/2020    ECHOCARDIOGRAM REPORT   Patient Name:   ASTORIA CONDON Date of Exam:  08/16/2020 Medical Rec #:  [id number]          Height:       68.0 in Accession #:    [id number]         Weight:       127.6 lb Date of Birth:  1932-06-17          BSA:          1.689 m Patient Age:    27 years           BP:           111/73 mmHg Patient Gender: F                  HR:           84 bpm. Exam Location:  [facility] Procedure: 2D Echo, Cardiac Doppler and Color Doppler Indications:    Pulmonary embolus  History:        Patient has prior history of Echocardiogram examinations, most                 recent 04/28/2019. CHF, Pacemaker, Arrythmias:Atrial Fibrillation;                 Signs/Symptoms:Dyspnea, Chest Pain and Pulmonary embolus.                 Fibrosis of lung.  Sonographer:  Dustin Flock RDCS Referring Phys: [id number] [place]  1. Left ventricular ejection fraction, by estimation, is 65 to 70%. The left ventricle has normal function. The left ventricle has no regional wall motion abnormalities. Left ventricular diastolic parameters are consistent with Grade II diastolic dysfunction (pseudonormalization).  2. Right ventricular systolic function is normal. The right ventricular size is normal. There is moderately elevated pulmonary artery systolic pressure. The estimated right ventricular systolic pressure is 63.8 mmHg.  3. The mitral valve is grossly normal. Mild mitral valve regurgitation.  4. Tricuspid valve regurgitation is moderate.  5. The aortic valve is tricuspid. Aortic valve regurgitation is not visualized.  6. The inferior vena cava is normal in size with <50% respiratory variability, suggesting right atrial pressure of 8 mmHg. FINDINGS  Left Ventricle: Left ventricular ejection fraction, by estimation, is 65 to 70%. The left ventricle has normal function. The left ventricle has no regional wall motion abnormalities. The left ventricular internal cavity size was normal in size. There is  no left ventricular hypertrophy. Left ventricular diastolic parameters are  consistent with Grade II diastolic dysfunction (pseudonormalization). Right Ventricle: The right ventricular size is normal. No increase in right ventricular wall thickness. Right ventricular systolic function is normal. There is moderately elevated pulmonary artery systolic pressure. The tricuspid regurgitant velocity is 3.22 m/s, and with an assumed right atrial pressure of 8 mmHg, the estimated right ventricular systolic pressure is 17.7 mmHg. Left Atrium: Left atrial size was normal in size. Right Atrium: Right atrial size was normal in size. Pericardium: There is no evidence of pericardial effusion. Mitral Valve: The mitral valve is grossly normal. Mild mitral valve regurgitation. Tricuspid Valve: The tricuspid valve is grossly normal. Tricuspid valve regurgitation is moderate. Aortic Valve: The aortic valve is tricuspid. There is mild aortic valve annular calcification. Aortic valve regurgitation is not visualized. Pulmonic Valve: The pulmonic valve was grossly normal. Pulmonic valve regurgitation is trivial. Aorta: The aortic root is normal in size and structure. Venous: The inferior vena cava is normal in size with less than 50% respiratory variability, suggesting right atrial pressure of 8 mmHg. IAS/Shunts: No atrial level shunt detected by color flow Doppler. Additional Comments: A device lead is visualized.  LEFT VENTRICLE PLAX 2D LVIDd:         4.06 cm  Diastology LVIDs:         2.12 cm  LV e' medial:    4.79 cm/s LV PW:         0.97 cm  LV E/e' medial:  19.7 LV IVS:        0.97 cm  LV e' lateral:   6.42 cm/s LVOT diam:     1.80 cm  LV E/e' lateral: 14.7 LV SV:         19 LV SV Index:   11 LVOT Area:     2.54 cm  RIGHT VENTRICLE RV S prime:     3.37 cm/s RVOT diam:      3.90 cm LEFT ATRIUM           Index       RIGHT ATRIUM           Index LA diam:      3.30 cm 1.95 cm/m  RA Area:     16.00 cm LA Vol (A4C): 42.4 ml 25.11 ml/m RA Volume:   44.70 ml  26.47 ml/m  AORTIC VALVE LVOT Vmax:   46.30 cm/s  LVOT Vmean:  28.200 cm/s LVOT VTI:  0.076 m  AORTA Ao Root diam: 2.90 cm MITRAL VALVE               TRICUSPID VALVE MV Area (PHT): 3.65 cm    TR Peak grad:   41.5 mmHg MV Decel Time: 208 msec    TR Vmax:        322.00 cm/s MV E velocity: 94.30 cm/s MV A velocity: 23.60 cm/s  SHUNTS MV E/A ratio:  4.00        Systemic VTI:  0.08 m                            Systemic Diam: 1.80 cm                            Pulmonic Diam: 3.90 cm Rozann Lesches MD Electronically signed by Rozann Lesches MD Signature Date/Time: 08/16/2020/3:54:04 PM    Final      Subjective:   Patient was seen and examined 08/19/2020, 11:18 AM Patient stable today. No acute distress.  No issues overnight Stable for discharge.  Discharge Exam:    Vitals:   08/18/20 1458 08/18/20 2045 08/19/20 0518 08/19/20 0814  BP: 119/62 120/69 103/67 131/87  Pulse: 74 76 80 96  Resp: 15 18 19    Temp: 98.4 F (36.9 C) 98 F (36.7 C) 97.6 F (36.4 C)   TempSrc: Oral     SpO2: 93% 94% 90% 90%  Weight:      Height:        General: Pt lying comfortably in bed & appears in no obvious distress. Cardiovascular: S1 & S2 heard, RRR, S1/S2 +. No murmurs, rubs, gallops or clicks. No JVD or pedal edema. Respiratory: Clear to auscultation without wheezing, rhonchi or crackles. No increased work of breathing. Abdominal:  Non-distended, non-tender & soft. No organomegaly or masses appreciated. Normal bowel sounds heard. CNS: Alert and oriented. No focal deficits. Extremities: no edema, no cyanosis      The results of significant diagnostics from this hospitalization (including imaging, microbiology, ancillary and laboratory) are listed below for reference.      Microbiology:   Recent Results (from the past 240 hour(s))  SARS CORONAVIRUS 2 (TAT 6-24 HRS) Nasopharyngeal Nasopharyngeal Swab     Status: None   Collection Time: 08/16/20  5:51 AM   Specimen: Nasopharyngeal Swab  Result Value Ref Range Status   SARS Coronavirus 2 NEGATIVE  NEGATIVE Final    Comment: (NOTE) SARS-CoV-2 target nucleic acids are NOT DETECTED.  The SARS-CoV-2 RNA is generally detectable in upper and lower respiratory specimens during the acute phase of infection. Negative results do not preclude SARS-CoV-2 infection, do not rule out co-infections with other pathogens, and should not be used as the sole basis for treatment or other patient management decisions. Negative results must be combined with clinical observations, patient history, and epidemiological information. The expected result is Negative.  Fact Sheet for Patients: SugarRoll.be  Fact Sheet for Healthcare Providers: https://www.woods-mathews.com/  This test is not yet approved or cleared by the [place] FDA and  has been authorized for detection and/or diagnosis of SARS-CoV-2 by FDA under an Emergency Use Authorization (EUA). This EUA will remain  in effect (meaning this test can be used) for the duration of the COVID-19 declaration under Se ction 564(b)(1) of the Act, 21 U.S.C. section 360bbb-3(b)(1), unless the authorization is terminated or revoked sooner.  Performed at [facility]  Hospital Lab, [place] [address] [place], [place] [address]      Labs:   CBC: Recent Labs  Lab 08/16/20 0038 08/18/20 0310 08/19/20 0510  WBC 13.0* 6.7 6.6  NEUTROABS 11.3*  --   --   HGB 12.3 10.9* 11.1*  HCT 37.8 33.6* 33.7*  MCV 97.2 97.4 96.6  PLT 178 155 917   Basic Metabolic Panel: Recent Labs  Lab 08/16/20 0038 08/17/20 0554  NA 132* 130*  K 3.9 3.5  CL 97* 99  CO2 26 25  GLUCOSE 136* 89  BUN 23 17  CREATININE 0.87 0.65  CALCIUM 8.4* 8.3*  MG  --  1.8   Liver Function Tests: Recent Labs  Lab 08/16/20 0038 08/17/20 0554  AST 22 15  ALT 12 10  ALKPHOS 88 69  BILITOT 1.1 1.3*  PROT 6.1* 5.2*  ALBUMIN 3.3* 2.8*   BNP (last 3 results) Recent Labs    11/06/19 1227 08/16/20 0038  BNP 272.0* 161.0*   Cardiac  Enzymes: No results for input(s): CKTOTAL, CKMB, CKMBINDEX, TROPONINI in the last 168 hours. CBG: No results for input(s): GLUCAP in the last 168 hours. Hgb A1c No results for input(s): HGBA1C in the last 72 hours. Lipid Profile No results for input(s): CHOL, HDL, LDLCALC, TRIG, CHOLHDL, LDLDIRECT in the last 72 hours. Thyroid function studies No results for input(s): TSH, T4TOTAL, T3FREE, THYROIDAB in the last 72 hours.  Invalid input(s): FREET3 Anemia work up No results for input(s): VITAMINB12, FOLATE, FERRITIN, TIBC, IRON, RETICCTPCT in the last 72 hours. Urinalysis    Component Value Date/Time   COLORURINE YELLOW 08/16/2020 1137   APPEARANCEUR CLEAR 08/16/2020 1137   LABSPEC 1.040 (H) 08/16/2020 1137   PHURINE 6.0 08/16/2020 1137   GLUCOSEU NEGATIVE 08/16/2020 1137   HGBUR NEGATIVE 08/16/2020 1137   BILIRUBINUR NEGATIVE 08/16/2020 1137   KETONESUR NEGATIVE 08/16/2020 1137   PROTEINUR NEGATIVE 08/16/2020 1137   UROBILINOGEN 0.2 03/13/2013 2208   NITRITE NEGATIVE 08/16/2020 1137   LEUKOCYTESUR SMALL (A) 08/16/2020 1137         Time coordinating discharge: Over 45 minutes  SIGNED: Deatra James, MD, FACP, FHM. [facility],  Please use [email or web address] to Page If 7PM-7AM, please contact night-coverage [email or web address], Password [facility] 08/19/2020, 11:18 AM

## 2020-08-20 DIAGNOSIS — E854 Organ-limited amyloidosis: Secondary | ICD-10-CM | POA: Diagnosis not present

## 2020-08-20 DIAGNOSIS — Z7901 Long term (current) use of anticoagulants: Secondary | ICD-10-CM | POA: Diagnosis not present

## 2020-08-20 DIAGNOSIS — Z5181 Encounter for therapeutic drug level monitoring: Secondary | ICD-10-CM | POA: Diagnosis not present

## 2020-08-20 DIAGNOSIS — Z9181 History of falling: Secondary | ICD-10-CM | POA: Diagnosis not present

## 2020-08-20 DIAGNOSIS — K5790 Diverticulosis of intestine, part unspecified, without perforation or abscess without bleeding: Secondary | ICD-10-CM | POA: Diagnosis not present

## 2020-08-20 DIAGNOSIS — I480 Paroxysmal atrial fibrillation: Secondary | ICD-10-CM | POA: Diagnosis not present

## 2020-08-20 DIAGNOSIS — K2100 Gastro-esophageal reflux disease with esophagitis, without bleeding: Secondary | ICD-10-CM | POA: Diagnosis not present

## 2020-08-20 DIAGNOSIS — I509 Heart failure, unspecified: Secondary | ICD-10-CM | POA: Diagnosis not present

## 2020-08-20 DIAGNOSIS — Z9981 Dependence on supplemental oxygen: Secondary | ICD-10-CM | POA: Diagnosis not present

## 2020-08-20 DIAGNOSIS — E785 Hyperlipidemia, unspecified: Secondary | ICD-10-CM | POA: Diagnosis not present

## 2020-08-20 DIAGNOSIS — K222 Esophageal obstruction: Secondary | ICD-10-CM | POA: Diagnosis not present

## 2020-08-20 DIAGNOSIS — M810 Age-related osteoporosis without current pathological fracture: Secondary | ICD-10-CM | POA: Diagnosis not present

## 2020-08-20 DIAGNOSIS — I2699 Other pulmonary embolism without acute cor pulmonale: Secondary | ICD-10-CM | POA: Diagnosis not present

## 2020-08-20 DIAGNOSIS — N390 Urinary tract infection, site not specified: Secondary | ICD-10-CM | POA: Diagnosis not present

## 2020-08-20 DIAGNOSIS — J8410 Pulmonary fibrosis, unspecified: Secondary | ICD-10-CM | POA: Diagnosis not present

## 2020-08-20 DIAGNOSIS — K5190 Ulcerative colitis, unspecified, without complications: Secondary | ICD-10-CM | POA: Diagnosis not present

## 2020-08-20 DIAGNOSIS — J9601 Acute respiratory failure with hypoxia: Secondary | ICD-10-CM | POA: Diagnosis not present

## 2020-08-21 DIAGNOSIS — Z7901 Long term (current) use of anticoagulants: Secondary | ICD-10-CM | POA: Diagnosis not present

## 2020-08-21 DIAGNOSIS — I4891 Unspecified atrial fibrillation: Secondary | ICD-10-CM | POA: Diagnosis not present

## 2020-08-21 DIAGNOSIS — I480 Paroxysmal atrial fibrillation: Secondary | ICD-10-CM | POA: Diagnosis not present

## 2020-08-21 DIAGNOSIS — E854 Organ-limited amyloidosis: Secondary | ICD-10-CM | POA: Diagnosis not present

## 2020-08-21 DIAGNOSIS — I2699 Other pulmonary embolism without acute cor pulmonale: Secondary | ICD-10-CM | POA: Diagnosis not present

## 2020-08-21 DIAGNOSIS — I509 Heart failure, unspecified: Secondary | ICD-10-CM | POA: Diagnosis not present

## 2020-08-21 DIAGNOSIS — J8410 Pulmonary fibrosis, unspecified: Secondary | ICD-10-CM | POA: Diagnosis not present

## 2020-08-21 DIAGNOSIS — M62830 Muscle spasm of back: Secondary | ICD-10-CM | POA: Diagnosis not present

## 2020-08-21 DIAGNOSIS — J9601 Acute respiratory failure with hypoxia: Secondary | ICD-10-CM | POA: Diagnosis not present

## 2020-08-23 DIAGNOSIS — I2699 Other pulmonary embolism without acute cor pulmonale: Secondary | ICD-10-CM | POA: Diagnosis not present

## 2020-08-23 DIAGNOSIS — I509 Heart failure, unspecified: Secondary | ICD-10-CM | POA: Diagnosis not present

## 2020-08-23 DIAGNOSIS — J9601 Acute respiratory failure with hypoxia: Secondary | ICD-10-CM | POA: Diagnosis not present

## 2020-08-23 DIAGNOSIS — E854 Organ-limited amyloidosis: Secondary | ICD-10-CM | POA: Diagnosis not present

## 2020-08-23 DIAGNOSIS — I480 Paroxysmal atrial fibrillation: Secondary | ICD-10-CM | POA: Diagnosis not present

## 2020-08-23 DIAGNOSIS — J8410 Pulmonary fibrosis, unspecified: Secondary | ICD-10-CM | POA: Diagnosis not present

## 2020-08-26 DIAGNOSIS — E854 Organ-limited amyloidosis: Secondary | ICD-10-CM | POA: Diagnosis not present

## 2020-08-26 DIAGNOSIS — I509 Heart failure, unspecified: Secondary | ICD-10-CM | POA: Diagnosis not present

## 2020-08-26 DIAGNOSIS — J8410 Pulmonary fibrosis, unspecified: Secondary | ICD-10-CM | POA: Diagnosis not present

## 2020-08-26 DIAGNOSIS — J9601 Acute respiratory failure with hypoxia: Secondary | ICD-10-CM | POA: Diagnosis not present

## 2020-08-26 DIAGNOSIS — I2699 Other pulmonary embolism without acute cor pulmonale: Secondary | ICD-10-CM | POA: Diagnosis not present

## 2020-08-26 DIAGNOSIS — I480 Paroxysmal atrial fibrillation: Secondary | ICD-10-CM | POA: Diagnosis not present

## 2020-08-28 DIAGNOSIS — Z882 Allergy status to sulfonamides status: Secondary | ICD-10-CM | POA: Diagnosis not present

## 2020-08-28 DIAGNOSIS — M549 Dorsalgia, unspecified: Secondary | ICD-10-CM | POA: Diagnosis not present

## 2020-08-28 DIAGNOSIS — I4891 Unspecified atrial fibrillation: Secondary | ICD-10-CM | POA: Diagnosis not present

## 2020-08-28 DIAGNOSIS — I509 Heart failure, unspecified: Secondary | ICD-10-CM | POA: Diagnosis not present

## 2020-08-28 DIAGNOSIS — Z7901 Long term (current) use of anticoagulants: Secondary | ICD-10-CM | POA: Diagnosis not present

## 2020-08-28 DIAGNOSIS — E8581 Light chain (AL) amyloidosis: Secondary | ICD-10-CM | POA: Diagnosis not present

## 2020-08-28 DIAGNOSIS — Z885 Allergy status to narcotic agent status: Secondary | ICD-10-CM | POA: Diagnosis not present

## 2020-08-28 DIAGNOSIS — R0600 Dyspnea, unspecified: Secondary | ICD-10-CM | POA: Diagnosis not present

## 2020-08-28 DIAGNOSIS — I5032 Chronic diastolic (congestive) heart failure: Secondary | ICD-10-CM | POA: Diagnosis not present

## 2020-08-28 DIAGNOSIS — Z79899 Other long term (current) drug therapy: Secondary | ICD-10-CM | POA: Diagnosis not present

## 2020-08-28 DIAGNOSIS — I2699 Other pulmonary embolism without acute cor pulmonale: Secondary | ICD-10-CM | POA: Diagnosis not present

## 2020-08-28 DIAGNOSIS — Z79890 Hormone replacement therapy (postmenopausal): Secondary | ICD-10-CM | POA: Diagnosis not present

## 2020-08-28 DIAGNOSIS — M79606 Pain in leg, unspecified: Secondary | ICD-10-CM | POA: Diagnosis not present

## 2020-08-28 DIAGNOSIS — I43 Cardiomyopathy in diseases classified elsewhere: Secondary | ICD-10-CM | POA: Diagnosis not present

## 2020-08-28 DIAGNOSIS — E854 Organ-limited amyloidosis: Secondary | ICD-10-CM | POA: Diagnosis not present

## 2020-08-28 DIAGNOSIS — I4892 Unspecified atrial flutter: Secondary | ICD-10-CM | POA: Diagnosis not present

## 2020-08-28 DIAGNOSIS — H3530 Unspecified macular degeneration: Secondary | ICD-10-CM | POA: Diagnosis not present

## 2020-08-29 DIAGNOSIS — J8410 Pulmonary fibrosis, unspecified: Secondary | ICD-10-CM | POA: Diagnosis not present

## 2020-08-29 DIAGNOSIS — Z950 Presence of cardiac pacemaker: Secondary | ICD-10-CM | POA: Diagnosis not present

## 2020-08-29 DIAGNOSIS — E854 Organ-limited amyloidosis: Secondary | ICD-10-CM | POA: Diagnosis not present

## 2020-08-29 DIAGNOSIS — I2699 Other pulmonary embolism without acute cor pulmonale: Secondary | ICD-10-CM | POA: Diagnosis not present

## 2020-08-29 DIAGNOSIS — I509 Heart failure, unspecified: Secondary | ICD-10-CM | POA: Diagnosis not present

## 2020-08-29 DIAGNOSIS — J9601 Acute respiratory failure with hypoxia: Secondary | ICD-10-CM | POA: Diagnosis not present

## 2020-08-29 DIAGNOSIS — I480 Paroxysmal atrial fibrillation: Secondary | ICD-10-CM | POA: Diagnosis not present

## 2020-09-03 DIAGNOSIS — J8410 Pulmonary fibrosis, unspecified: Secondary | ICD-10-CM | POA: Diagnosis not present

## 2020-09-03 DIAGNOSIS — E854 Organ-limited amyloidosis: Secondary | ICD-10-CM | POA: Diagnosis not present

## 2020-09-03 DIAGNOSIS — I480 Paroxysmal atrial fibrillation: Secondary | ICD-10-CM | POA: Diagnosis not present

## 2020-09-03 DIAGNOSIS — I509 Heart failure, unspecified: Secondary | ICD-10-CM | POA: Diagnosis not present

## 2020-09-03 DIAGNOSIS — I2699 Other pulmonary embolism without acute cor pulmonale: Secondary | ICD-10-CM | POA: Diagnosis not present

## 2020-09-03 DIAGNOSIS — J9601 Acute respiratory failure with hypoxia: Secondary | ICD-10-CM | POA: Diagnosis not present

## 2020-09-05 DIAGNOSIS — R0902 Hypoxemia: Secondary | ICD-10-CM | POA: Diagnosis not present

## 2020-09-05 DIAGNOSIS — I2699 Other pulmonary embolism without acute cor pulmonale: Secondary | ICD-10-CM | POA: Diagnosis not present

## 2020-09-05 DIAGNOSIS — Z7901 Long term (current) use of anticoagulants: Secondary | ICD-10-CM | POA: Diagnosis not present

## 2020-09-06 DIAGNOSIS — E854 Organ-limited amyloidosis: Secondary | ICD-10-CM | POA: Diagnosis not present

## 2020-09-06 DIAGNOSIS — I480 Paroxysmal atrial fibrillation: Secondary | ICD-10-CM | POA: Diagnosis not present

## 2020-09-06 DIAGNOSIS — I509 Heart failure, unspecified: Secondary | ICD-10-CM | POA: Diagnosis not present

## 2020-09-06 DIAGNOSIS — J8410 Pulmonary fibrosis, unspecified: Secondary | ICD-10-CM | POA: Diagnosis not present

## 2020-09-06 DIAGNOSIS — J9601 Acute respiratory failure with hypoxia: Secondary | ICD-10-CM | POA: Diagnosis not present

## 2020-09-06 DIAGNOSIS — I2699 Other pulmonary embolism without acute cor pulmonale: Secondary | ICD-10-CM | POA: Diagnosis not present

## 2020-09-09 DIAGNOSIS — E854 Organ-limited amyloidosis: Secondary | ICD-10-CM | POA: Diagnosis not present

## 2020-09-09 DIAGNOSIS — J9601 Acute respiratory failure with hypoxia: Secondary | ICD-10-CM | POA: Diagnosis not present

## 2020-09-09 DIAGNOSIS — I480 Paroxysmal atrial fibrillation: Secondary | ICD-10-CM | POA: Diagnosis not present

## 2020-09-09 DIAGNOSIS — I509 Heart failure, unspecified: Secondary | ICD-10-CM | POA: Diagnosis not present

## 2020-09-09 DIAGNOSIS — Z7901 Long term (current) use of anticoagulants: Secondary | ICD-10-CM | POA: Diagnosis not present

## 2020-09-09 DIAGNOSIS — I2699 Other pulmonary embolism without acute cor pulmonale: Secondary | ICD-10-CM | POA: Diagnosis not present

## 2020-09-09 DIAGNOSIS — Z9981 Dependence on supplemental oxygen: Secondary | ICD-10-CM | POA: Diagnosis not present

## 2020-09-09 DIAGNOSIS — J8410 Pulmonary fibrosis, unspecified: Secondary | ICD-10-CM | POA: Diagnosis not present

## 2020-09-09 DIAGNOSIS — Z86711 Personal history of pulmonary embolism: Secondary | ICD-10-CM | POA: Diagnosis not present

## 2020-09-11 ENCOUNTER — Other Ambulatory Visit: Payer: Self-pay

## 2020-09-11 ENCOUNTER — Ambulatory Visit (INDEPENDENT_AMBULATORY_CARE_PROVIDER_SITE_OTHER): Payer: Medicare Other | Admitting: Ophthalmology

## 2020-09-11 ENCOUNTER — Encounter (INDEPENDENT_AMBULATORY_CARE_PROVIDER_SITE_OTHER): Payer: Self-pay | Admitting: Ophthalmology

## 2020-09-11 DIAGNOSIS — H353221 Exudative age-related macular degeneration, left eye, with active choroidal neovascularization: Secondary | ICD-10-CM

## 2020-09-11 MED ORDER — BEVACIZUMAB 2.5 MG/0.1ML IZ SOSY
2.5000 mg | PREFILLED_SYRINGE | INTRAVITREAL | Status: AC | PRN
  Administered 2020-09-11: 2.5 mg via INTRAVITREAL

## 2020-09-11 NOTE — Assessment & Plan Note (Signed)
OS s slightly worse at 10 weeks, with intraretinal fluid inferior to the fovea weeks, acuity limited by subretinal fibrosis and scarring

## 2020-09-11 NOTE — Progress Notes (Signed)
09/11/2020     CHIEF COMPLAINT Patient presents for Retina Follow Up (10 Wk F/U OS, poss Avastin OS//Pt denies noticeable changes to [place] OU since last visit. Pt denies ocular pain, flashes of light, or floaters OU. //)   HISTORY OF PRESENT ILLNESS: Cindy [name] is a 87 y.o. female who presents to the clinic today for:   HPI    Retina Follow Up    Diagnosis: Wet AMD   Laterality: left eye   Onset: 10 weeks ago   Severity: mild   Duration: 10 weeks   Course: stable   Comments: 10 Wk F/U OS, poss Avastin OS  Pt denies noticeable changes to [place] OU since last visit. Pt denies ocular pain, flashes of light, or floaters OU.             Comments    No interval change in vision in the left eye, and also no worsening and no distortions       Last edited by Hurman Horn, MD on 09/11/2020 10:09 AM. (History)      Referring physician: Celene Squibb, MD [place],  [place] [address]  HISTORICAL INFORMATION:   Selected notes from the MEDICAL RECORD NUMBER    Lab Results  Component Value Date   HGBA1C 6.1 (H) 03/14/2013     CURRENT MEDICATIONS: Current Outpatient Medications (Ophthalmic Drugs)  Medication Sig  . Polyethyl Glycol-Propyl Glycol (SYSTANE OP) Apply 1 drop to eye daily.   No current facility-administered medications for this visit. (Ophthalmic Drugs)   Current Outpatient Medications (Other)  Medication Sig  . acetaminophen (TYLENOL) 500 MG tablet Take 1,000 mg by mouth as needed.  Marland Kitchen albuterol (PROVENTIL) (2.5 MG/3ML) 0.083% nebulizer solution Take 3 mLs by nebulization 4 (four) times daily as needed.  . ALPRAZolam (XANAX) 0.5 MG tablet Take 1 tablet (0.5 mg total) by mouth at bedtime as needed for anxiety.  . calcium carbonate (TUMS - DOSED IN MG ELEMENTAL CALCIUM) 500 MG chewable tablet Chew 1 tablet by mouth daily.  Marland Kitchen esomeprazole (NEXIUM) 40 MG capsule Take 1 capsule by mouth every evening.  . fentaNYL (DURAGESIC) 25 MCG/HR Place 1 patch  onto the skin every 3 (three) days.  Marland Kitchen levothyroxine (SYNTHROID) 112 MCG tablet Take 112 mcg by mouth daily.  . metoprolol succinate (TOPROL-XL) 25 MG 24 hr tablet Take 0.5-1 tablets (12.5-25 mg total) by mouth in the morning and at bedtime. Patient takes 1 tablet (71m) in the morning and 1/2 of a tablet (12.533m in the evening  . Multiple Vitamins-Minerals (VISION VITAMINS) TABS Take 1 tablet by mouth daily.  . nitrofurantoin, macrocrystal-monohydrate, (MACROBID) 100 MG capsule Take 100 mg by mouth 2 (two) times daily.  . ondansetron (ZOFRAN) 4 MG tablet Take 4 mg by mouth every 8 (eight) hours as needed.  . potassium chloride SA (KLOR-CON) 20 MEQ tablet Take 20 mEq by mouth daily.  . Marland Kitchenenna (SENOKOT) 8.6 MG tablet Take 2 tablets by mouth as needed for constipation.  . sodium chloride (OCEAN) 0.65 % nasal spray Place 2 sprays into the nose daily as needed.   . torsemide (DEMADEX) 20 MG tablet Take 20 mg by mouth daily.  . Marland Kitchenarfarin (COUMADIN) 2.5 MG tablet Take 1 tablet (2.5 mg total) by mouth one time only at 4 PM.   No current facility-administered medications for this visit. (Other)      REVIEW OF SYSTEMS:    ALLERGIES Allergies  Allergen Reactions  . Nebivolol Hcl  Other reaction(s): Other (See Comments) Chest tightness,sob,cough,severe tiredness,confusion  . Codeine Nausea Only  . Sulfa Antibiotics Nausea Only  . Sulfonamide Derivatives Nausea Only    PAST MEDICAL HISTORY Past Medical History:  Diagnosis Date  . Cancer ([place])   . CHF (congestive heart failure) (HCC)    Amyloidosis  . Diarrhea   . Diverticulosis of colon (without mention of hemorrhage)   . Edema   . Esophagitis, unspecified   . Family history of malignant neoplasm of gastrointestinal tract   . Fibrosis of lung ([place]) 03/14/2013  . Flatulence, eructation, and gas pain   . GERD (gastroesophageal reflux disease)   . Left sided ulcerative colitis ([place])   . Maxillary sinus mass   . Osteoporosis,  unspecified   . Other and unspecified hyperlipidemia   . Other and unspecified noninfectious gastroenteritis and colitis(558.9)   . Paroxysmal atrial fibrillation ([place]) 07/09/2019  . Stricture and stenosis of esophagus   . Unspecified hypothyroidism   . URI (upper respiratory infection)    Past Surgical History:  Procedure Laterality Date  . ABDOMINAL HYSTERECTOMY    . CATARACT EXTRACTION W/PHACO Bilateral    Dr. Gershon Crane  . CHOLECYSTECTOMY    . FOOT SURGERY    . THYROIDECTOMY, PARTIAL    . YAG LASER APPLICATION Right 6/80/3212   Procedure: YAG LASER APPLICATION;  Surgeon: Elta Guadeloupe T. Gershon Crane, MD;  Location: [facility];  Service: Ophthalmology;  Laterality: Right;    FAMILY HISTORY Family History  Problem Relation Age of Onset  . Colon cancer Mother   . Heart attack Father        CHF  . Stomach cancer Sister   . Diabetes Son   . Kidney cancer Son   . Adrenal disorder Son   . Heart disease Brother     SOCIAL HISTORY Social History   Tobacco Use  . Smoking status: Never Smoker  . Smokeless tobacco: Never Used  Vaping Use  . Vaping Use: Never used  Substance Use Topics  . Alcohol use: No    Alcohol/week: 0.0 standard drinks  . Drug use: No         OPHTHALMIC EXAM: Base Eye Exam    Visual Acuity (ETDRS)      Right Left   Dist [place] 20/40 CF @ 5'   Dist ph [place] NI 20/100 -2       Tonometry (Tonopen, 9:04 AM)      Right Left   Pressure 08 09       Pupils      Pupils Dark Light Shape React APD   Right PERRL 4 3 Round Minimal None   Left PERRL 4 3 Round Minimal None       Visual Fields (Counting fingers)      Left Right    Full Full       Extraocular Movement      Right Left    Full Full       Neuro/Psych    Oriented x3: Yes   Mood/Affect: Normal       Dilation    Left eye: 1.0% Mydriacyl, 2.5% Phenylephrine @ 9:04 AM        Slit Lamp and Fundus Exam    External Exam      Right Left   External Normal Normal       Slit Lamp Exam      Right Left    Lids/Lashes Normal Normal   Conjunctiva/Sclera White and quiet White and quiet   Cornea  Clear Clear   Anterior Chamber Deep and quiet Deep and quiet   Iris Round and reactive Round and reactive   Lens  Posterior chamber intraocular lens   Anterior Vitreous Normal Normal       Fundus Exam      Right Left   Posterior Vitreous  ,,, Posterior vitreous detachment   Disc  Normal   C/D Ratio  0.1   Macula  Atrophy, Age related macular degeneration, Early age related macular degeneration, Drusen, Macular thickening, dry atrophic change in the macula    Vessels  Normal   Periphery  Normal          IMAGING AND PROCEDURES  Imaging and Procedures for 09/11/20  OCT, Retina - OU - Both Eyes       Right Eye Quality was good. Scan locations included subfoveal. Central Foveal Thickness: 315. Progression has been stable. Findings include retinal drusen , no SRF, no IRF.   Left Eye Quality was good. Scan locations included subfoveal. Central Foveal Thickness: 301. Progression has improved. Findings include abnormal foveal contour, retinal drusen , cystoid macular edema, subretinal hyper-reflective material, no SRF, no IRF.   Notes  OS, with recurrence of CNVM notable by intraretinal fluid and CME delineated on January 2022.  Vastly improved macular anatomy left eye with less CME and intraretinal fluid and less subretinal material we'll repeat injection Avastin today at 10-week interval and examination again in 8 weeks       Intravitreal Injection, Pharmacologic Agent - OS - Left Eye       Time Out 09/11/2020. 10:12 AM. Confirmed correct patient, procedure, site, and patient consented.   Anesthesia Topical anesthesia was used. Anesthetic medications included Akten 3.5%.   Procedure Preparation included 10% betadine to eyelids, Tobramycin 0.3%, 5% betadine to ocular surface. A supplied needle was used.   Injection:  2.5 mg Bevacizumab (AVASTIN) 2.51m/0.1mL SOSY   NDC::  20355-974-16 Lot:: [id number]  Route: Intravitreal, Site: Left Eye  Post-op Post injection exam found visual acuity of at least counting fingers. The patient tolerated the procedure well. There were no complications. The patient received written and verbal post procedure care education. Post injection medications were not given.                 ASSESSMENT/PLAN:  Exudative age-related macular degeneration of left eye with active choroidal neovascularization (HCC) OS s slightly worse at 10 weeks, with intraretinal fluid inferior to the fovea weeks, acuity limited by subretinal fibrosis and scarring      ICD-10-CM   1. Exudative age-related macular degeneration of left eye with active choroidal neovascularization (HCC)  H35.3221 OCT, Retina - OU - Both Eyes    Intravitreal Injection, Pharmacologic Agent - OS - Left Eye    bevacizumab (AVASTIN) SOSY 2.5 mg    1.  Wet AMD with subfoveal scarring limiting acuity OS in addition to some intraretinal fluid developing inferior to the fovea at this longer interval than planned at 10 weeks, will repeat injection today and shorten interval to 8 weeks  2.  Dilate OU next to monitor to the right eye  3.  Ophthalmic Meds Ordered this visit:  Meds ordered this encounter  Medications  . bevacizumab (AVASTIN) SOSY 2.5 mg       Return in about 8 weeks (around 11/06/2020) for DILATE OU, AVASTIN OCT, OS.  There are no Patient Instructions on file for this visit.   Explained the diagnoses, plan, and follow up with the patient and  they expressed understanding.  Patient expressed understanding of the importance of proper follow up care.   Clent Demark Amore Grater M.D. Diseases & Surgery of the Retina and Vitreous Retina & Diabetic [place] 09/11/20     Abbreviations: M myopia (nearsighted); A astigmatism; H hyperopia (farsighted); P presbyopia; Mrx spectacle prescription;  CTL contact lenses; OD right eye; OS left eye; OU both eyes  XT exotropia;  ET esotropia; PEK punctate epithelial keratitis; PEE punctate epithelial erosions; DES dry eye syndrome; MGD meibomian gland dysfunction; ATs artificial tears; PFAT's preservative free artificial tears; [place] nuclear sclerotic cataract; PSC posterior subcapsular cataract; ERM epi-retinal membrane; PVD posterior vitreous detachment; RD retinal detachment; DM diabetes mellitus; DR diabetic retinopathy; NPDR non-proliferative diabetic retinopathy; PDR proliferative diabetic retinopathy; CSME clinically significant macular edema; DME diabetic macular edema; dbh dot blot hemorrhages; CWS cotton wool spot; POAG primary open angle glaucoma; C/D cup-to-disc ratio; HVF humphrey visual field; GVF goldmann visual field; OCT optical coherence tomography; IOP intraocular pressure; BRVO Branch retinal vein occlusion; CRVO central retinal vein occlusion; CRAO central retinal artery occlusion; BRAO branch retinal artery occlusion; RT retinal tear; SB scleral buckle; PPV pars plana vitrectomy; VH Vitreous hemorrhage; PRP panretinal laser photocoagulation; IVK intravitreal kenalog; VMT vitreomacular traction; MH Macular hole;  NVD neovascularization of the disc; NVE neovascularization elsewhere; AREDS age related eye disease study; ARMD age related macular degeneration; POAG primary open angle glaucoma; EBMD epithelial/anterior basement membrane dystrophy; ACIOL anterior chamber intraocular lens; IOL intraocular lens; PCIOL posterior chamber intraocular lens; Phaco/IOL phacoemulsification with intraocular lens placement; [place] photorefractive keratectomy; LASIK laser assisted in situ keratomileusis; HTN hypertension; DM diabetes mellitus; COPD chronic obstructive pulmonary disease

## 2020-09-12 DIAGNOSIS — J9601 Acute respiratory failure with hypoxia: Secondary | ICD-10-CM | POA: Diagnosis not present

## 2020-09-12 DIAGNOSIS — I480 Paroxysmal atrial fibrillation: Secondary | ICD-10-CM | POA: Diagnosis not present

## 2020-09-12 DIAGNOSIS — I2699 Other pulmonary embolism without acute cor pulmonale: Secondary | ICD-10-CM | POA: Diagnosis not present

## 2020-09-12 DIAGNOSIS — I509 Heart failure, unspecified: Secondary | ICD-10-CM | POA: Diagnosis not present

## 2020-09-12 DIAGNOSIS — J8410 Pulmonary fibrosis, unspecified: Secondary | ICD-10-CM | POA: Diagnosis not present

## 2020-09-12 DIAGNOSIS — E854 Organ-limited amyloidosis: Secondary | ICD-10-CM | POA: Diagnosis not present

## 2020-09-13 DIAGNOSIS — E1165 Type 2 diabetes mellitus with hyperglycemia: Secondary | ICD-10-CM | POA: Diagnosis not present

## 2020-09-13 DIAGNOSIS — K219 Gastro-esophageal reflux disease without esophagitis: Secondary | ICD-10-CM | POA: Diagnosis not present

## 2020-09-16 DIAGNOSIS — Z950 Presence of cardiac pacemaker: Secondary | ICD-10-CM | POA: Diagnosis not present

## 2020-09-18 DIAGNOSIS — J8410 Pulmonary fibrosis, unspecified: Secondary | ICD-10-CM | POA: Diagnosis not present

## 2020-09-18 DIAGNOSIS — I480 Paroxysmal atrial fibrillation: Secondary | ICD-10-CM | POA: Diagnosis not present

## 2020-09-18 DIAGNOSIS — I2699 Other pulmonary embolism without acute cor pulmonale: Secondary | ICD-10-CM | POA: Diagnosis not present

## 2020-09-18 DIAGNOSIS — E854 Organ-limited amyloidosis: Secondary | ICD-10-CM | POA: Diagnosis not present

## 2020-09-18 DIAGNOSIS — J9601 Acute respiratory failure with hypoxia: Secondary | ICD-10-CM | POA: Diagnosis not present

## 2020-09-18 DIAGNOSIS — I509 Heart failure, unspecified: Secondary | ICD-10-CM | POA: Diagnosis not present

## 2020-09-19 DIAGNOSIS — Z9981 Dependence on supplemental oxygen: Secondary | ICD-10-CM | POA: Diagnosis not present

## 2020-09-19 DIAGNOSIS — E785 Hyperlipidemia, unspecified: Secondary | ICD-10-CM | POA: Diagnosis not present

## 2020-09-19 DIAGNOSIS — Z5181 Encounter for therapeutic drug level monitoring: Secondary | ICD-10-CM | POA: Diagnosis not present

## 2020-09-19 DIAGNOSIS — N390 Urinary tract infection, site not specified: Secondary | ICD-10-CM | POA: Diagnosis not present

## 2020-09-19 DIAGNOSIS — E854 Organ-limited amyloidosis: Secondary | ICD-10-CM | POA: Diagnosis not present

## 2020-09-19 DIAGNOSIS — K2100 Gastro-esophageal reflux disease with esophagitis, without bleeding: Secondary | ICD-10-CM | POA: Diagnosis not present

## 2020-09-19 DIAGNOSIS — Z9181 History of falling: Secondary | ICD-10-CM | POA: Diagnosis not present

## 2020-09-19 DIAGNOSIS — J8410 Pulmonary fibrosis, unspecified: Secondary | ICD-10-CM | POA: Diagnosis not present

## 2020-09-19 DIAGNOSIS — I2699 Other pulmonary embolism without acute cor pulmonale: Secondary | ICD-10-CM | POA: Diagnosis not present

## 2020-09-19 DIAGNOSIS — I509 Heart failure, unspecified: Secondary | ICD-10-CM | POA: Diagnosis not present

## 2020-09-19 DIAGNOSIS — K5190 Ulcerative colitis, unspecified, without complications: Secondary | ICD-10-CM | POA: Diagnosis not present

## 2020-09-19 DIAGNOSIS — M810 Age-related osteoporosis without current pathological fracture: Secondary | ICD-10-CM | POA: Diagnosis not present

## 2020-09-19 DIAGNOSIS — Z7901 Long term (current) use of anticoagulants: Secondary | ICD-10-CM | POA: Diagnosis not present

## 2020-09-19 DIAGNOSIS — K5790 Diverticulosis of intestine, part unspecified, without perforation or abscess without bleeding: Secondary | ICD-10-CM | POA: Diagnosis not present

## 2020-09-19 DIAGNOSIS — I480 Paroxysmal atrial fibrillation: Secondary | ICD-10-CM | POA: Diagnosis not present

## 2020-09-19 DIAGNOSIS — J9601 Acute respiratory failure with hypoxia: Secondary | ICD-10-CM | POA: Diagnosis not present

## 2020-09-19 DIAGNOSIS — K222 Esophageal obstruction: Secondary | ICD-10-CM | POA: Diagnosis not present

## 2020-09-23 DIAGNOSIS — J8410 Pulmonary fibrosis, unspecified: Secondary | ICD-10-CM | POA: Diagnosis not present

## 2020-09-23 DIAGNOSIS — I509 Heart failure, unspecified: Secondary | ICD-10-CM | POA: Diagnosis not present

## 2020-09-23 DIAGNOSIS — I2699 Other pulmonary embolism without acute cor pulmonale: Secondary | ICD-10-CM | POA: Diagnosis not present

## 2020-09-23 DIAGNOSIS — E854 Organ-limited amyloidosis: Secondary | ICD-10-CM | POA: Diagnosis not present

## 2020-09-23 DIAGNOSIS — J9601 Acute respiratory failure with hypoxia: Secondary | ICD-10-CM | POA: Diagnosis not present

## 2020-09-23 DIAGNOSIS — I480 Paroxysmal atrial fibrillation: Secondary | ICD-10-CM | POA: Diagnosis not present

## 2020-09-30 DIAGNOSIS — J9601 Acute respiratory failure with hypoxia: Secondary | ICD-10-CM | POA: Diagnosis not present

## 2020-09-30 DIAGNOSIS — I509 Heart failure, unspecified: Secondary | ICD-10-CM | POA: Diagnosis not present

## 2020-09-30 DIAGNOSIS — I2699 Other pulmonary embolism without acute cor pulmonale: Secondary | ICD-10-CM | POA: Diagnosis not present

## 2020-09-30 DIAGNOSIS — J8410 Pulmonary fibrosis, unspecified: Secondary | ICD-10-CM | POA: Diagnosis not present

## 2020-09-30 DIAGNOSIS — E854 Organ-limited amyloidosis: Secondary | ICD-10-CM | POA: Diagnosis not present

## 2020-09-30 DIAGNOSIS — I480 Paroxysmal atrial fibrillation: Secondary | ICD-10-CM | POA: Diagnosis not present

## 2020-10-07 DIAGNOSIS — E039 Hypothyroidism, unspecified: Secondary | ICD-10-CM | POA: Diagnosis not present

## 2020-10-09 DIAGNOSIS — G894 Chronic pain syndrome: Secondary | ICD-10-CM | POA: Diagnosis not present

## 2020-10-09 DIAGNOSIS — K219 Gastro-esophageal reflux disease without esophagitis: Secondary | ICD-10-CM | POA: Diagnosis not present

## 2020-10-09 DIAGNOSIS — E039 Hypothyroidism, unspecified: Secondary | ICD-10-CM | POA: Diagnosis not present

## 2020-10-09 DIAGNOSIS — R7303 Prediabetes: Secondary | ICD-10-CM | POA: Diagnosis not present

## 2020-10-09 DIAGNOSIS — R945 Abnormal results of liver function studies: Secondary | ICD-10-CM | POA: Diagnosis not present

## 2020-10-09 DIAGNOSIS — E782 Mixed hyperlipidemia: Secondary | ICD-10-CM | POA: Diagnosis not present

## 2020-10-09 DIAGNOSIS — H3530 Unspecified macular degeneration: Secondary | ICD-10-CM | POA: Diagnosis not present

## 2020-10-09 DIAGNOSIS — E859 Amyloidosis, unspecified: Secondary | ICD-10-CM | POA: Diagnosis not present

## 2020-10-09 DIAGNOSIS — I420 Dilated cardiomyopathy: Secondary | ICD-10-CM | POA: Diagnosis not present

## 2020-10-09 DIAGNOSIS — J704 Drug-induced interstitial lung disorders, unspecified: Secondary | ICD-10-CM | POA: Diagnosis not present

## 2020-10-09 DIAGNOSIS — Z86711 Personal history of pulmonary embolism: Secondary | ICD-10-CM | POA: Diagnosis not present

## 2020-10-09 DIAGNOSIS — I509 Heart failure, unspecified: Secondary | ICD-10-CM | POA: Diagnosis not present

## 2020-10-10 DIAGNOSIS — J8410 Pulmonary fibrosis, unspecified: Secondary | ICD-10-CM | POA: Diagnosis not present

## 2020-10-10 DIAGNOSIS — I480 Paroxysmal atrial fibrillation: Secondary | ICD-10-CM | POA: Diagnosis not present

## 2020-10-10 DIAGNOSIS — I2699 Other pulmonary embolism without acute cor pulmonale: Secondary | ICD-10-CM | POA: Diagnosis not present

## 2020-10-10 DIAGNOSIS — I509 Heart failure, unspecified: Secondary | ICD-10-CM | POA: Diagnosis not present

## 2020-10-10 DIAGNOSIS — J9601 Acute respiratory failure with hypoxia: Secondary | ICD-10-CM | POA: Diagnosis not present

## 2020-10-10 DIAGNOSIS — E854 Organ-limited amyloidosis: Secondary | ICD-10-CM | POA: Diagnosis not present

## 2020-10-17 DIAGNOSIS — K219 Gastro-esophageal reflux disease without esophagitis: Secondary | ICD-10-CM | POA: Diagnosis not present

## 2020-10-17 DIAGNOSIS — I2699 Other pulmonary embolism without acute cor pulmonale: Secondary | ICD-10-CM | POA: Diagnosis not present

## 2020-10-17 DIAGNOSIS — J9601 Acute respiratory failure with hypoxia: Secondary | ICD-10-CM | POA: Diagnosis not present

## 2020-10-17 DIAGNOSIS — E854 Organ-limited amyloidosis: Secondary | ICD-10-CM | POA: Diagnosis not present

## 2020-10-17 DIAGNOSIS — E1165 Type 2 diabetes mellitus with hyperglycemia: Secondary | ICD-10-CM | POA: Diagnosis not present

## 2020-10-17 DIAGNOSIS — J8410 Pulmonary fibrosis, unspecified: Secondary | ICD-10-CM | POA: Diagnosis not present

## 2020-10-17 DIAGNOSIS — I509 Heart failure, unspecified: Secondary | ICD-10-CM | POA: Diagnosis not present

## 2020-10-17 DIAGNOSIS — I480 Paroxysmal atrial fibrillation: Secondary | ICD-10-CM | POA: Diagnosis not present

## 2020-10-19 DIAGNOSIS — J8410 Pulmonary fibrosis, unspecified: Secondary | ICD-10-CM | POA: Diagnosis not present

## 2020-10-19 DIAGNOSIS — Z9981 Dependence on supplemental oxygen: Secondary | ICD-10-CM | POA: Diagnosis not present

## 2020-10-19 DIAGNOSIS — J9601 Acute respiratory failure with hypoxia: Secondary | ICD-10-CM | POA: Diagnosis not present

## 2020-10-19 DIAGNOSIS — Z5181 Encounter for therapeutic drug level monitoring: Secondary | ICD-10-CM | POA: Diagnosis not present

## 2020-10-19 DIAGNOSIS — K2100 Gastro-esophageal reflux disease with esophagitis, without bleeding: Secondary | ICD-10-CM | POA: Diagnosis not present

## 2020-10-19 DIAGNOSIS — Z7901 Long term (current) use of anticoagulants: Secondary | ICD-10-CM | POA: Diagnosis not present

## 2020-10-19 DIAGNOSIS — K222 Esophageal obstruction: Secondary | ICD-10-CM | POA: Diagnosis not present

## 2020-10-19 DIAGNOSIS — K51819 Other ulcerative colitis with unspecified complications: Secondary | ICD-10-CM | POA: Diagnosis not present

## 2020-10-19 DIAGNOSIS — Z87440 Personal history of urinary (tract) infections: Secondary | ICD-10-CM | POA: Diagnosis not present

## 2020-10-19 DIAGNOSIS — E785 Hyperlipidemia, unspecified: Secondary | ICD-10-CM | POA: Diagnosis not present

## 2020-10-19 DIAGNOSIS — I509 Heart failure, unspecified: Secondary | ICD-10-CM | POA: Diagnosis not present

## 2020-10-19 DIAGNOSIS — Z9181 History of falling: Secondary | ICD-10-CM | POA: Diagnosis not present

## 2020-10-19 DIAGNOSIS — M810 Age-related osteoporosis without current pathological fracture: Secondary | ICD-10-CM | POA: Diagnosis not present

## 2020-10-19 DIAGNOSIS — K5190 Ulcerative colitis, unspecified, without complications: Secondary | ICD-10-CM | POA: Diagnosis not present

## 2020-10-19 DIAGNOSIS — I2699 Other pulmonary embolism without acute cor pulmonale: Secondary | ICD-10-CM | POA: Diagnosis not present

## 2020-10-19 DIAGNOSIS — K5790 Diverticulosis of intestine, part unspecified, without perforation or abscess without bleeding: Secondary | ICD-10-CM | POA: Diagnosis not present

## 2020-10-19 DIAGNOSIS — E854 Organ-limited amyloidosis: Secondary | ICD-10-CM | POA: Diagnosis not present

## 2020-10-19 DIAGNOSIS — I480 Paroxysmal atrial fibrillation: Secondary | ICD-10-CM | POA: Diagnosis not present

## 2020-10-26 DIAGNOSIS — J9601 Acute respiratory failure with hypoxia: Secondary | ICD-10-CM | POA: Diagnosis not present

## 2020-10-26 DIAGNOSIS — E854 Organ-limited amyloidosis: Secondary | ICD-10-CM | POA: Diagnosis not present

## 2020-10-26 DIAGNOSIS — I2699 Other pulmonary embolism without acute cor pulmonale: Secondary | ICD-10-CM | POA: Diagnosis not present

## 2020-10-26 DIAGNOSIS — J8410 Pulmonary fibrosis, unspecified: Secondary | ICD-10-CM | POA: Diagnosis not present

## 2020-10-26 DIAGNOSIS — I480 Paroxysmal atrial fibrillation: Secondary | ICD-10-CM | POA: Diagnosis not present

## 2020-10-26 DIAGNOSIS — I509 Heart failure, unspecified: Secondary | ICD-10-CM | POA: Diagnosis not present

## 2020-10-28 DIAGNOSIS — R0602 Shortness of breath: Secondary | ICD-10-CM | POA: Diagnosis not present

## 2020-10-28 DIAGNOSIS — I480 Paroxysmal atrial fibrillation: Secondary | ICD-10-CM | POA: Diagnosis not present

## 2020-10-28 DIAGNOSIS — I442 Atrioventricular block, complete: Secondary | ICD-10-CM | POA: Diagnosis not present

## 2020-10-28 DIAGNOSIS — I43 Cardiomyopathy in diseases classified elsewhere: Secondary | ICD-10-CM | POA: Diagnosis not present

## 2020-10-28 DIAGNOSIS — I5032 Chronic diastolic (congestive) heart failure: Secondary | ICD-10-CM | POA: Diagnosis not present

## 2020-10-28 DIAGNOSIS — Z950 Presence of cardiac pacemaker: Secondary | ICD-10-CM | POA: Diagnosis not present

## 2020-10-28 DIAGNOSIS — E854 Organ-limited amyloidosis: Secondary | ICD-10-CM | POA: Diagnosis not present

## 2020-10-28 DIAGNOSIS — I425 Other restrictive cardiomyopathy: Secondary | ICD-10-CM | POA: Diagnosis not present

## 2020-10-28 DIAGNOSIS — J849 Interstitial pulmonary disease, unspecified: Secondary | ICD-10-CM | POA: Diagnosis not present

## 2020-11-02 DIAGNOSIS — J8410 Pulmonary fibrosis, unspecified: Secondary | ICD-10-CM | POA: Diagnosis not present

## 2020-11-02 DIAGNOSIS — I509 Heart failure, unspecified: Secondary | ICD-10-CM | POA: Diagnosis not present

## 2020-11-02 DIAGNOSIS — J9601 Acute respiratory failure with hypoxia: Secondary | ICD-10-CM | POA: Diagnosis not present

## 2020-11-02 DIAGNOSIS — I2699 Other pulmonary embolism without acute cor pulmonale: Secondary | ICD-10-CM | POA: Diagnosis not present

## 2020-11-02 DIAGNOSIS — I480 Paroxysmal atrial fibrillation: Secondary | ICD-10-CM | POA: Diagnosis not present

## 2020-11-02 DIAGNOSIS — E854 Organ-limited amyloidosis: Secondary | ICD-10-CM | POA: Diagnosis not present

## 2020-11-06 ENCOUNTER — Encounter (INDEPENDENT_AMBULATORY_CARE_PROVIDER_SITE_OTHER): Payer: Self-pay | Admitting: Ophthalmology

## 2020-11-06 ENCOUNTER — Other Ambulatory Visit: Payer: Self-pay

## 2020-11-06 ENCOUNTER — Ambulatory Visit (INDEPENDENT_AMBULATORY_CARE_PROVIDER_SITE_OTHER): Payer: Medicare Other | Admitting: Ophthalmology

## 2020-11-06 DIAGNOSIS — H353221 Exudative age-related macular degeneration, left eye, with active choroidal neovascularization: Secondary | ICD-10-CM

## 2020-11-06 MED ORDER — BEVACIZUMAB 2.5 MG/0.1ML IZ SOSY
2.5000 mg | PREFILLED_SYRINGE | INTRAVITREAL | Status: AC | PRN
  Administered 2020-11-06: 2.5 mg via INTRAVITREAL

## 2020-11-06 NOTE — Assessment & Plan Note (Addendum)
Today at 8-week interval.  Slight increase in vision from counting to 20/400 left eye.  Also with less intraretinal fluid and CME 8 weeks post injection Avastin.  Acuity is limited however by previous disciform scarring and geographic atrophy subfoveal left eye.  We will repeat injection Avastin OS today and examination OS next in 8 weeks again
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
## 2020-11-07 DIAGNOSIS — E854 Organ-limited amyloidosis: Secondary | ICD-10-CM | POA: Diagnosis not present

## 2020-11-07 DIAGNOSIS — I509 Heart failure, unspecified: Secondary | ICD-10-CM | POA: Diagnosis not present

## 2020-11-07 DIAGNOSIS — J8410 Pulmonary fibrosis, unspecified: Secondary | ICD-10-CM | POA: Diagnosis not present

## 2020-11-07 DIAGNOSIS — I480 Paroxysmal atrial fibrillation: Secondary | ICD-10-CM | POA: Diagnosis not present

## 2020-11-07 DIAGNOSIS — J9601 Acute respiratory failure with hypoxia: Secondary | ICD-10-CM | POA: Diagnosis not present

## 2020-11-07 DIAGNOSIS — L820 Inflamed seborrheic keratosis: Secondary | ICD-10-CM | POA: Diagnosis not present

## 2020-11-07 DIAGNOSIS — I2699 Other pulmonary embolism without acute cor pulmonale: Secondary | ICD-10-CM | POA: Diagnosis not present

## 2020-11-13 DIAGNOSIS — I2699 Other pulmonary embolism without acute cor pulmonale: Secondary | ICD-10-CM | POA: Diagnosis not present

## 2020-11-13 DIAGNOSIS — E854 Organ-limited amyloidosis: Secondary | ICD-10-CM | POA: Diagnosis not present

## 2020-11-13 DIAGNOSIS — I480 Paroxysmal atrial fibrillation: Secondary | ICD-10-CM | POA: Diagnosis not present

## 2020-11-13 DIAGNOSIS — I509 Heart failure, unspecified: Secondary | ICD-10-CM | POA: Diagnosis not present

## 2020-11-13 DIAGNOSIS — J8410 Pulmonary fibrosis, unspecified: Secondary | ICD-10-CM | POA: Diagnosis not present

## 2020-11-13 DIAGNOSIS — J9601 Acute respiratory failure with hypoxia: Secondary | ICD-10-CM | POA: Diagnosis not present

## 2020-11-18 DIAGNOSIS — I509 Heart failure, unspecified: Secondary | ICD-10-CM | POA: Diagnosis not present

## 2020-11-18 DIAGNOSIS — J8410 Pulmonary fibrosis, unspecified: Secondary | ICD-10-CM | POA: Diagnosis not present

## 2020-11-18 DIAGNOSIS — I2699 Other pulmonary embolism without acute cor pulmonale: Secondary | ICD-10-CM | POA: Diagnosis not present

## 2020-11-18 DIAGNOSIS — I480 Paroxysmal atrial fibrillation: Secondary | ICD-10-CM | POA: Diagnosis not present

## 2020-11-18 DIAGNOSIS — K222 Esophageal obstruction: Secondary | ICD-10-CM | POA: Diagnosis not present

## 2020-11-18 DIAGNOSIS — E854 Organ-limited amyloidosis: Secondary | ICD-10-CM | POA: Diagnosis not present

## 2020-11-18 DIAGNOSIS — Z5181 Encounter for therapeutic drug level monitoring: Secondary | ICD-10-CM | POA: Diagnosis not present

## 2020-11-18 DIAGNOSIS — Z7901 Long term (current) use of anticoagulants: Secondary | ICD-10-CM | POA: Diagnosis not present

## 2020-11-18 DIAGNOSIS — K2100 Gastro-esophageal reflux disease with esophagitis, without bleeding: Secondary | ICD-10-CM | POA: Diagnosis not present

## 2020-11-18 DIAGNOSIS — Z87440 Personal history of urinary (tract) infections: Secondary | ICD-10-CM | POA: Diagnosis not present

## 2020-11-18 DIAGNOSIS — K5190 Ulcerative colitis, unspecified, without complications: Secondary | ICD-10-CM | POA: Diagnosis not present

## 2020-11-18 DIAGNOSIS — E785 Hyperlipidemia, unspecified: Secondary | ICD-10-CM | POA: Diagnosis not present

## 2020-11-18 DIAGNOSIS — M810 Age-related osteoporosis without current pathological fracture: Secondary | ICD-10-CM | POA: Diagnosis not present

## 2020-11-18 DIAGNOSIS — J9601 Acute respiratory failure with hypoxia: Secondary | ICD-10-CM | POA: Diagnosis not present

## 2020-11-18 DIAGNOSIS — Z9981 Dependence on supplemental oxygen: Secondary | ICD-10-CM | POA: Diagnosis not present

## 2020-11-18 DIAGNOSIS — Z9181 History of falling: Secondary | ICD-10-CM | POA: Diagnosis not present

## 2020-11-18 DIAGNOSIS — K5790 Diverticulosis of intestine, part unspecified, without perforation or abscess without bleeding: Secondary | ICD-10-CM | POA: Diagnosis not present

## 2020-11-21 DIAGNOSIS — E854 Organ-limited amyloidosis: Secondary | ICD-10-CM | POA: Diagnosis not present

## 2020-11-21 DIAGNOSIS — I509 Heart failure, unspecified: Secondary | ICD-10-CM | POA: Diagnosis not present

## 2020-11-21 DIAGNOSIS — J9601 Acute respiratory failure with hypoxia: Secondary | ICD-10-CM | POA: Diagnosis not present

## 2020-11-21 DIAGNOSIS — I480 Paroxysmal atrial fibrillation: Secondary | ICD-10-CM | POA: Diagnosis not present

## 2020-11-21 DIAGNOSIS — I2699 Other pulmonary embolism without acute cor pulmonale: Secondary | ICD-10-CM | POA: Diagnosis not present

## 2020-11-21 DIAGNOSIS — J8410 Pulmonary fibrosis, unspecified: Secondary | ICD-10-CM | POA: Diagnosis not present

## 2020-11-27 DIAGNOSIS — E785 Hyperlipidemia, unspecified: Secondary | ICD-10-CM | POA: Diagnosis not present

## 2020-11-27 DIAGNOSIS — Z86711 Personal history of pulmonary embolism: Secondary | ICD-10-CM | POA: Diagnosis not present

## 2020-11-27 DIAGNOSIS — Z79899 Other long term (current) drug therapy: Secondary | ICD-10-CM | POA: Diagnosis not present

## 2020-11-27 DIAGNOSIS — J849 Interstitial pulmonary disease, unspecified: Secondary | ICD-10-CM | POA: Diagnosis not present

## 2020-11-27 DIAGNOSIS — I5032 Chronic diastolic (congestive) heart failure: Secondary | ICD-10-CM | POA: Diagnosis not present

## 2020-11-27 DIAGNOSIS — Z950 Presence of cardiac pacemaker: Secondary | ICD-10-CM | POA: Diagnosis not present

## 2020-11-27 DIAGNOSIS — E039 Hypothyroidism, unspecified: Secondary | ICD-10-CM | POA: Diagnosis not present

## 2020-11-27 DIAGNOSIS — G6289 Other specified polyneuropathies: Secondary | ICD-10-CM | POA: Diagnosis not present

## 2020-11-27 DIAGNOSIS — E8581 Light chain (AL) amyloidosis: Secondary | ICD-10-CM | POA: Diagnosis not present

## 2020-11-27 DIAGNOSIS — Z79890 Hormone replacement therapy (postmenopausal): Secondary | ICD-10-CM | POA: Diagnosis not present

## 2020-11-27 DIAGNOSIS — I480 Paroxysmal atrial fibrillation: Secondary | ICD-10-CM | POA: Diagnosis not present

## 2020-11-27 DIAGNOSIS — Z7901 Long term (current) use of anticoagulants: Secondary | ICD-10-CM | POA: Diagnosis not present

## 2020-11-28 DIAGNOSIS — J9601 Acute respiratory failure with hypoxia: Secondary | ICD-10-CM | POA: Diagnosis not present

## 2020-11-28 DIAGNOSIS — J8410 Pulmonary fibrosis, unspecified: Secondary | ICD-10-CM | POA: Diagnosis not present

## 2020-11-28 DIAGNOSIS — I509 Heart failure, unspecified: Secondary | ICD-10-CM | POA: Diagnosis not present

## 2020-11-28 DIAGNOSIS — E854 Organ-limited amyloidosis: Secondary | ICD-10-CM | POA: Diagnosis not present

## 2020-11-28 DIAGNOSIS — I480 Paroxysmal atrial fibrillation: Secondary | ICD-10-CM | POA: Diagnosis not present

## 2020-11-28 DIAGNOSIS — I2699 Other pulmonary embolism without acute cor pulmonale: Secondary | ICD-10-CM | POA: Diagnosis not present

## 2020-12-16 DIAGNOSIS — E854 Organ-limited amyloidosis: Secondary | ICD-10-CM | POA: Diagnosis not present

## 2020-12-16 DIAGNOSIS — R0602 Shortness of breath: Secondary | ICD-10-CM | POA: Diagnosis not present

## 2020-12-16 DIAGNOSIS — J90 Pleural effusion, not elsewhere classified: Secondary | ICD-10-CM | POA: Diagnosis not present

## 2020-12-16 DIAGNOSIS — I43 Cardiomyopathy in diseases classified elsewhere: Secondary | ICD-10-CM | POA: Diagnosis not present

## 2020-12-16 DIAGNOSIS — J849 Interstitial pulmonary disease, unspecified: Secondary | ICD-10-CM | POA: Diagnosis not present

## 2020-12-24 DIAGNOSIS — R0602 Shortness of breath: Secondary | ICD-10-CM | POA: Diagnosis not present

## 2020-12-24 DIAGNOSIS — J849 Interstitial pulmonary disease, unspecified: Secondary | ICD-10-CM | POA: Diagnosis not present

## 2021-01-01 ENCOUNTER — Other Ambulatory Visit: Payer: Self-pay

## 2021-01-01 ENCOUNTER — Encounter (INDEPENDENT_AMBULATORY_CARE_PROVIDER_SITE_OTHER): Payer: Self-pay | Admitting: Ophthalmology

## 2021-01-01 ENCOUNTER — Ambulatory Visit (INDEPENDENT_AMBULATORY_CARE_PROVIDER_SITE_OTHER): Payer: Medicare Other | Admitting: Ophthalmology

## 2021-01-01 ENCOUNTER — Encounter (INDEPENDENT_AMBULATORY_CARE_PROVIDER_SITE_OTHER): Payer: Medicare Other | Admitting: Ophthalmology

## 2021-01-01 DIAGNOSIS — H353221 Exudative age-related macular degeneration, left eye, with active choroidal neovascularization: Secondary | ICD-10-CM

## 2021-01-01 DIAGNOSIS — H35352 Cystoid macular degeneration, left eye: Secondary | ICD-10-CM

## 2021-01-01 DIAGNOSIS — H353133 Nonexudative age-related macular degeneration, bilateral, advanced atrophic without subfoveal involvement: Secondary | ICD-10-CM | POA: Diagnosis not present

## 2021-01-01 MED ORDER — BEVACIZUMAB 2.5 MG/0.1ML IZ SOSY
2.5000 mg | PREFILLED_SYRINGE | INTRAVITREAL | Status: AC | PRN
  Administered 2021-01-01: 2.5 mg via INTRAVITREAL

## 2021-01-01 NOTE — Assessment & Plan Note (Signed)
OS, vastly improved macular condition and anatomy as compared to onset of CNVM lesion in a subfoveal location January 2022.  Currently at 8-week interval post Avastin.  We will repeat injection today and examination next in 9-week

## 2021-01-01 NOTE — Assessment & Plan Note (Signed)
OS with some progressive atrophy accounting for acuity

## 2021-01-01 NOTE — Progress Notes (Signed)
01/01/2021     CHIEF COMPLAINT Patient presents for  Chief Complaint  Patient presents with   Retina Follow Up      HISTORY OF PRESENT ILLNESS: Cindy [name] is a 87 y.o. female who presents to the clinic today for:   HPI     Retina Follow Up   Patient presents with  Wet AMD.  In both eyes.  This started 8 weeks ago.  Duration of 8 weeks.        Comments   8 week f/u OU with OCT and possible Avastin  Pt denies any visual changes since previous visit. Pt denies any new flashes or floaters. Pt denies any eye pain. Pt would like some direction for getting some new updated glasses and would like to know if she would benefit from any specific kind of glasses.   Eye Meds: None      Last edited by Reather Littler, COA on 01/01/2021  9:42 AM.      Referring physician: Celene Squibb, MD [place],  [place] [address]  HISTORICAL INFORMATION:   Selected notes from the MEDICAL RECORD NUMBER    Lab Results  Component Value Date   HGBA1C 6.1 (H) 03/14/2013     CURRENT MEDICATIONS: Current Outpatient Medications (Ophthalmic Drugs)  Medication Sig   Polyethyl Glycol-Propyl Glycol (SYSTANE OP) Apply 1 drop to eye daily.   No current facility-administered medications for this visit. (Ophthalmic Drugs)   Current Outpatient Medications (Other)  Medication Sig   acetaminophen (TYLENOL) 500 MG tablet Take 1,000 mg by mouth as needed.   albuterol (PROVENTIL) (2.5 MG/3ML) 0.083% nebulizer solution Take 3 mLs by nebulization 4 (four) times daily as needed.   ALPRAZolam (XANAX) 0.5 MG tablet Take 1 tablet (0.5 mg total) by mouth at bedtime as needed for anxiety.   calcium carbonate (TUMS - DOSED IN MG ELEMENTAL CALCIUM) 500 MG chewable tablet Chew 1 tablet by mouth daily.   esomeprazole (NEXIUM) 40 MG capsule Take 1 capsule by mouth every evening.   fentaNYL (DURAGESIC) 25 MCG/HR Place 1 patch onto the skin every 3 (three) days.   levothyroxine (SYNTHROID)  112 MCG tablet Take 112 mcg by mouth daily.   metoprolol succinate (TOPROL-XL) 25 MG 24 hr tablet Take 0.5-1 tablets (12.5-25 mg total) by mouth in the morning and at bedtime. Patient takes 1 tablet (34m) in the morning and 1/2 of a tablet (12.572m in the evening   Multiple Vitamins-Minerals (VISION VITAMINS) TABS Take 1 tablet by mouth daily.   nitrofurantoin, macrocrystal-monohydrate, (MACROBID) 100 MG capsule Take 100 mg by mouth 2 (two) times daily.   ondansetron (ZOFRAN) 4 MG tablet Take 4 mg by mouth every 8 (eight) hours as needed.   potassium chloride SA (KLOR-CON) 20 MEQ tablet Take 20 mEq by mouth daily.   senna (SENOKOT) 8.6 MG tablet Take 2 tablets by mouth as needed for constipation.   sodium chloride (OCEAN) 0.65 % nasal spray Place 2 sprays into the nose daily as needed.    torsemide (DEMADEX) 20 MG tablet Take 20 mg by mouth daily.   warfarin (COUMADIN) 2.5 MG tablet Take 1 tablet (2.5 mg total) by mouth one time only at 4 PM.   No current facility-administered medications for this visit. (Other)      REVIEW OF SYSTEMS:    ALLERGIES Allergies  Allergen Reactions   Nebivolol Hcl     Other reaction(s): Other (See Comments) Chest tightness,sob,cough,severe tiredness,confusion  Codeine Nausea Only   Sulfa Antibiotics Nausea Only   Sulfonamide Derivatives Nausea Only    PAST MEDICAL HISTORY Past Medical History:  Diagnosis Date   Cancer ([place])    CHF (congestive heart failure) (HCC)    Amyloidosis   Diarrhea    Diverticulosis of colon (without mention of hemorrhage)    Edema    Esophagitis, unspecified    Family history of malignant neoplasm of gastrointestinal tract    Fibrosis of lung ([place]) 03/14/2013   Flatulence, eructation, and gas pain    GERD (gastroesophageal reflux disease)    Left sided ulcerative colitis ([place])    Maxillary sinus mass    Osteoporosis, unspecified    Other and unspecified hyperlipidemia    Other and unspecified noninfectious  gastroenteritis and colitis(558.9)    Paroxysmal atrial fibrillation ([place]) 07/09/2019   Stricture and stenosis of esophagus    Unspecified hypothyroidism    URI (upper respiratory infection)    Past Surgical History:  Procedure Laterality Date   ABDOMINAL HYSTERECTOMY     CATARACT EXTRACTION W/PHACO Bilateral    Dr. Gershon Crane   CHOLECYSTECTOMY     FOOT SURGERY     THYROIDECTOMY, PARTIAL     YAG LASER APPLICATION Right 1/44/8185   Procedure: YAG LASER APPLICATION;  Surgeon: Elta Guadeloupe T. Gershon Crane, MD;  Location: [facility];  Service: Ophthalmology;  Laterality: Right;    FAMILY HISTORY Family History  Problem Relation Age of Onset   Colon cancer Mother    Heart attack Father        CHF   Stomach cancer Sister    Diabetes Son    Kidney cancer Son    Adrenal disorder Son    Heart disease Brother     SOCIAL HISTORY Social History   Tobacco Use   Smoking status: Never   Smokeless tobacco: Never  Vaping Use   Vaping Use: Never used  Substance Use Topics   Alcohol use: No    Alcohol/week: 0.0 standard drinks   Drug use: No         OPHTHALMIC EXAM:  Base Eye Exam     Visual Acuity (ETDRS)       Right Left   Dist [place] 20/50 -2 20/400   Dist ph [place] 20/40 -2 20/100         Tonometry (Tonopen, 9:51 AM)       Right Left   Pressure 14 10         Pupils       Pupils Dark Light Shape React APD   Right PERRL 4 3 Round Brisk None   Left PERRL 4 3 Round Brisk None         Visual Fields (Counting fingers)       Left Right    Full    Restrictions  Central scotoma         Extraocular Movement       Right Left    Full, Ortho Full, Ortho         Neuro/Psych     Oriented x3: Yes   Mood/Affect: Normal         Dilation     Both eyes: 1.0% Mydriacyl, 2.5% Phenylephrine @ 9:51 AM           Slit Lamp and Fundus Exam     External Exam       Right Left   External Normal Normal         Slit Lamp Exam  Right Left   Lids/Lashes Normal  Normal   Conjunctiva/Sclera White and quiet White and quiet   Cornea Clear Clear   Anterior Chamber Deep and quiet Deep and quiet   Iris Round and reactive Round and reactive   Lens  Posterior chamber intraocular lens   Anterior Vitreous Normal Normal         Fundus Exam       Right Left   Posterior Vitreous Posterior vitreous detachment ,,, Posterior vitreous detachment   Disc Normal Normal   C/D Ratio 0.2 0.1   Macula Hard drusen, Pigmented atrophy Atrophy, Age related macular degeneration, Early age related macular degeneration, Drusen, Macular thickening, dry atrophic change in the macula    Vessels Normal Normal   Periphery Normal Normal            IMAGING AND PROCEDURES  Imaging and Procedures for 01/01/21  OCT, Retina - OU - Both Eyes       Right Eye Quality was good. Scan locations included subfoveal. Central Foveal Thickness: 322. Progression has been stable. Findings include abnormal foveal contour, retinal drusen .   Left Eye Quality was good. Scan locations included subfoveal. Central Foveal Thickness: 263. Progression has been stable. Findings include retinal drusen , abnormal foveal contour.   Notes OS vastly improved as compared to onset of CNVM with less intraretinal fluid and subretinal fluid in January 2022.  Currently at 8-week interval.     Intravitreal Injection, Pharmacologic Agent - OS - Left Eye       Time Out 01/01/2021. 10:12 AM. Confirmed correct patient, procedure, site, and patient consented.   Anesthesia Topical anesthesia was used. Anesthetic medications included [place] 3.5%.   Procedure Preparation included 10% betadine to eyelids, Tobramycin 0.3%, 5% betadine to ocular surface. A 30 gauge needle was used.   Injection: 2.5 mg bevacizumab 2.5 MG/0.1ML   Route: Intravitreal, Site: Left Eye   NDC: [phone], Lot: [id number]   Post-op Post injection exam found visual acuity of at least counting fingers. The patient tolerated the  procedure well. There were no complications. The patient received written and verbal post procedure care education. Post injection medications included ocuflox.              ASSESSMENT/PLAN:  Exudative age-related macular degeneration of left eye with active choroidal neovascularization (HCC) OS, vastly improved macular condition and anatomy as compared to onset of CNVM lesion in a subfoveal location January 2022.  Currently at 8-week interval post Avastin.  We will repeat injection today and examination next in 9-week  Cystoid macular edema of left eye Component of wet AMD onset January 2022, improved  Advanced nonexudative age-related macular degeneration of both eyes without subfoveal involvement OS with some progressive atrophy accounting for acuity     ICD-10-CM   1. Exudative age-related macular degeneration of left eye with active choroidal neovascularization (HCC)  H35.3221 OCT, Retina - OU - Both Eyes    Intravitreal Injection, Pharmacologic Agent - OS - Left Eye    bevacizumab (AVASTIN) SOSY 2.5 mg    2. Cystoid macular edema of left eye  H35.352     3. Advanced nonexudative age-related macular degeneration of both eyes without subfoveal involvement  H35.3133       1.  Stabilized and much improved macular anatomy left eye on intravitreal Avastin currently at 8-week follow-up for subfoveal CNVM formation.  Anatomy and findings improved since onset of this disorder January 2022.  Repeat injection Avastin OS today and examination next in  9 weeks  2.  OD with stable dry AMD, no signs of CNVM formation  3.  Ophthalmic Meds Ordered this visit:  Meds ordered this encounter  Medications   bevacizumab (AVASTIN) SOSY 2.5 mg       Return in about 9 weeks (around 03/05/2021) for DILATE OU, AVASTIN OCT, OS.  There are no Patient Instructions on file for this visit.   Explained the diagnoses, plan, and follow up with the patient and they expressed understanding.  Patient  expressed understanding of the importance of proper follow up care.   Clent Demark Kaarin Pardy M.D. Diseases & Surgery of the Retina and Vitreous [facility] 01/01/21     Abbreviations: M myopia (nearsighted); A astigmatism; H hyperopia (farsighted); P presbyopia; Mrx spectacle prescription;  CTL contact lenses; OD right eye; OS left eye; OU both eyes  XT exotropia; ET esotropia; PEK punctate epithelial keratitis; PEE punctate epithelial erosions; DES dry eye syndrome; MGD meibomian gland dysfunction; ATs artificial tears; PFAT's preservative free artificial tears; [place] nuclear sclerotic cataract; PSC posterior subcapsular cataract; ERM epi-retinal membrane; PVD posterior vitreous detachment; RD retinal detachment; DM diabetes mellitus; DR diabetic retinopathy; NPDR non-proliferative diabetic retinopathy; PDR proliferative diabetic retinopathy; CSME clinically significant macular edema; DME diabetic macular edema; dbh dot blot hemorrhages; CWS cotton wool spot; POAG primary open angle glaucoma; C/D cup-to-disc ratio; HVF humphrey visual field; GVF goldmann visual field; OCT optical coherence tomography; IOP intraocular pressure; BRVO Branch retinal vein occlusion; CRVO central retinal vein occlusion; CRAO central retinal artery occlusion; BRAO branch retinal artery occlusion; RT retinal tear; SB scleral buckle; PPV pars plana vitrectomy; VH Vitreous hemorrhage; PRP panretinal laser photocoagulation; IVK intravitreal kenalog; VMT vitreomacular traction; MH Macular hole;  NVD neovascularization of the disc; NVE neovascularization elsewhere; AREDS age related eye disease study; ARMD age related macular degeneration; POAG primary open angle glaucoma; EBMD epithelial/anterior basement membrane dystrophy; ACIOL anterior chamber intraocular lens; IOL intraocular lens; PCIOL posterior chamber intraocular lens; Phaco/IOL phacoemulsification with intraocular lens placement; [place] photorefractive keratectomy;  LASIK laser assisted in situ keratomileusis; HTN hypertension; DM diabetes mellitus; COPD chronic obstructive pulmonary disease

## 2021-01-01 NOTE — Assessment & Plan Note (Signed)
Component of wet AMD onset January 2022, improved

## 2021-01-08 DIAGNOSIS — I43 Cardiomyopathy in diseases classified elsewhere: Secondary | ICD-10-CM | POA: Diagnosis not present

## 2021-01-08 DIAGNOSIS — J849 Interstitial pulmonary disease, unspecified: Secondary | ICD-10-CM | POA: Diagnosis not present

## 2021-01-08 DIAGNOSIS — E854 Organ-limited amyloidosis: Secondary | ICD-10-CM | POA: Diagnosis not present

## 2021-01-08 DIAGNOSIS — R0602 Shortness of breath: Secondary | ICD-10-CM | POA: Diagnosis not present

## 2021-01-09 ENCOUNTER — Encounter (HOSPITAL_COMMUNITY): Payer: Self-pay | Admitting: Emergency Medicine

## 2021-01-09 ENCOUNTER — Emergency Department (HOSPITAL_COMMUNITY): Payer: Medicare Other

## 2021-01-09 ENCOUNTER — Emergency Department (HOSPITAL_COMMUNITY)
Admission: EM | Admit: 2021-01-09 | Discharge: 2021-01-09 | Disposition: A | Discharge status: Home / Self Care | Payer: Medicare Other | Attending: Emergency Medicine | Admitting: Emergency Medicine

## 2021-01-09 ENCOUNTER — Other Ambulatory Visit: Payer: Self-pay

## 2021-01-09 DIAGNOSIS — Z23 Encounter for immunization: Secondary | ICD-10-CM | POA: Diagnosis not present

## 2021-01-09 DIAGNOSIS — R918 Other nonspecific abnormal finding of lung field: Secondary | ICD-10-CM | POA: Diagnosis not present

## 2021-01-09 DIAGNOSIS — Z7901 Long term (current) use of anticoagulants: Secondary | ICD-10-CM | POA: Diagnosis not present

## 2021-01-09 DIAGNOSIS — G319 Degenerative disease of nervous system, unspecified: Secondary | ICD-10-CM | POA: Diagnosis not present

## 2021-01-09 DIAGNOSIS — R0902 Hypoxemia: Secondary | ICD-10-CM | POA: Diagnosis not present

## 2021-01-09 DIAGNOSIS — Y92094 Garage of other non-institutional residence as the place of occurrence of the external cause: Secondary | ICD-10-CM | POA: Insufficient documentation

## 2021-01-09 DIAGNOSIS — S0990XA Unspecified injury of head, initial encounter: Secondary | ICD-10-CM | POA: Diagnosis present

## 2021-01-09 DIAGNOSIS — W19XXXA Unspecified fall, initial encounter: Secondary | ICD-10-CM

## 2021-01-09 DIAGNOSIS — S199XXA Unspecified injury of neck, initial encounter: Secondary | ICD-10-CM | POA: Diagnosis not present

## 2021-01-09 DIAGNOSIS — S01111A Laceration without foreign body of right eyelid and periocular area, initial encounter: Secondary | ICD-10-CM | POA: Insufficient documentation

## 2021-01-09 DIAGNOSIS — Z85828 Personal history of other malignant neoplasm of skin: Secondary | ICD-10-CM | POA: Insufficient documentation

## 2021-01-09 DIAGNOSIS — E039 Hypothyroidism, unspecified: Secondary | ICD-10-CM | POA: Diagnosis not present

## 2021-01-09 DIAGNOSIS — M25551 Pain in right hip: Secondary | ICD-10-CM

## 2021-01-09 DIAGNOSIS — S0083XA Contusion of other part of head, initial encounter: Secondary | ICD-10-CM | POA: Diagnosis not present

## 2021-01-09 DIAGNOSIS — R000 Tachycardia, unspecified: Secondary | ICD-10-CM | POA: Diagnosis not present

## 2021-01-09 DIAGNOSIS — M25531 Pain in right wrist: Secondary | ICD-10-CM | POA: Insufficient documentation

## 2021-01-09 DIAGNOSIS — M25561 Pain in right knee: Secondary | ICD-10-CM | POA: Insufficient documentation

## 2021-01-09 DIAGNOSIS — M7989 Other specified soft tissue disorders: Secondary | ICD-10-CM | POA: Diagnosis not present

## 2021-01-09 DIAGNOSIS — J984 Other disorders of lung: Secondary | ICD-10-CM | POA: Diagnosis not present

## 2021-01-09 DIAGNOSIS — I5033 Acute on chronic diastolic (congestive) heart failure: Secondary | ICD-10-CM | POA: Insufficient documentation

## 2021-01-09 DIAGNOSIS — M47816 Spondylosis without myelopathy or radiculopathy, lumbar region: Secondary | ICD-10-CM | POA: Diagnosis not present

## 2021-01-09 DIAGNOSIS — Z79899 Other long term (current) drug therapy: Secondary | ICD-10-CM | POA: Diagnosis not present

## 2021-01-09 DIAGNOSIS — W1839XA Other fall on same level, initial encounter: Secondary | ICD-10-CM | POA: Diagnosis not present

## 2021-01-09 DIAGNOSIS — M25539 Pain in unspecified wrist: Secondary | ICD-10-CM | POA: Diagnosis not present

## 2021-01-09 DIAGNOSIS — I110 Hypertensive heart disease with heart failure: Secondary | ICD-10-CM | POA: Diagnosis not present

## 2021-01-09 DIAGNOSIS — R52 Pain, unspecified: Secondary | ICD-10-CM | POA: Diagnosis not present

## 2021-01-09 DIAGNOSIS — R079 Chest pain, unspecified: Secondary | ICD-10-CM | POA: Diagnosis not present

## 2021-01-09 MED ORDER — ONDANSETRON 4 MG PO TBDP
4.0000 mg | ORAL_TABLET | Freq: Once | ORAL | Status: AC
  Administered 2021-01-09: 4 mg via ORAL
  Filled 2021-01-09: qty 1

## 2021-01-09 MED ORDER — FENTANYL CITRATE PF 50 MCG/ML IJ SOSY: 50.0000 ug | PREFILLED_SYRINGE | Freq: Once | INTRAMUSCULAR | Status: DC

## 2021-01-09 MED ORDER — LIDOCAINE HCL (PF) 1 % IJ SOLN
5.0000 mL | Freq: Once | INTRAMUSCULAR | Status: AC
  Administered 2021-01-09: 5 mL
  Filled 2021-01-09: qty 30

## 2021-01-09 MED ORDER — TETANUS-DIPHTH-ACELL PERTUSSIS 5-2.5-18.5 LF-MCG/0.5 IM SUSY
0.5000 mL | PREFILLED_SYRINGE | Freq: Once | INTRAMUSCULAR | Status: AC
  Administered 2021-01-09: 0.5 mL via INTRAMUSCULAR
  Filled 2021-01-09: qty 0.5

## 2021-01-09 MED ORDER — HYDROCODONE-ACETAMINOPHEN 5-325 MG PO TABS
1.0000 | ORAL_TABLET | Freq: Once | ORAL | Status: AC
Start: 2021-01-09 — End: 2021-01-09
  Administered 2021-01-09: 1 via ORAL
  Filled 2021-01-09: qty 1

## 2021-01-09 NOTE — ED Provider Notes (Signed)
[facility] EMERGENCY DEPARTMENT Provider Note   CSN: [id number] Arrival date & time: 01/09/21  1555     History Chief Complaint  Patient presents with   Cindy [name] is a 87 y.o. female on blood thinner Eliquis presents to the ED via EMS after a fall prior to a arrival.  The patient reports she was helping her husband out of the car when he tripped, she fell on the concrete in the garage, and he landed on her.  Patient reports laceration to right forehead, right wrist pain, right hip pain, right knee pain.  She reports her pain is exacerbated by movement and touch. She denies any low back pain, chest pain, shortness of breath, abdominal pain, numbness, or tingling. She reports a medical history of HTN, hypothyroidism, and bone marrow cancer in remission. She does not remember all of her medications, but mentions Eliquis, Metoprolol, and "a medication for (her) thyroid". Additionally, she wears a fentanyl patch for chronic pain.    Fall Pertinent negatives include no chest pain, no abdominal pain, no headaches and no shortness of breath.      Past Medical History:  Diagnosis Date   Cancer ([place])    CHF (congestive heart failure) ([place])    Amyloidosis   Diarrhea    Diverticulosis of colon (without mention of hemorrhage)    Edema    Esophagitis, unspecified    Family history of malignant neoplasm of gastrointestinal tract    Fibrosis of lung ([place]) 03/14/2013   Flatulence, eructation, and gas pain    GERD (gastroesophageal reflux disease)    Left sided ulcerative colitis ([place])    Maxillary sinus mass    Osteoporosis, unspecified    Other and unspecified hyperlipidemia    Other and unspecified noninfectious gastroenteritis and colitis(558.9)    Paroxysmal atrial fibrillation ([place]) 07/09/2019   Stricture and stenosis of esophagus    Unspecified hypothyroidism    URI (upper respiratory infection)     Patient Active Problem List   Diagnosis Date Noted   Acute  pulmonary embolism ([place]) 08/16/2020   Acute respiratory failure with hypoxia ([place]) 08/16/2020   Exudative age-related macular degeneration of left eye with active choroidal neovascularization ([place]) 08/07/2019   Cystoid macular edema of left eye 08/07/2019   Posterior vitreous detachment of left eye 08/07/2019   Advanced nonexudative age-related macular degeneration of both eyes without subfoveal involvement 08/07/2019   Light chain (AL) amyloidosis (HCC)    Paroxysmal atrial fibrillation ([place]) 07/09/2019   Chronic diastolic heart failure ([place])    Pulmonary embolism ([place]) 07/08/2019   Hypertension    Cardiac amyloidosis ([place]) 10/27/2014   Chest pain 10/25/2014   Acute renal failure ([place]) 10/25/2014   Hyponatremia 10/25/2014   Hypotension 10/25/2014   Acute on chronic diastolic heart failure ([place]) 10/25/2014   Malnutrition of moderate degree ([place]) 10/25/2014   Change in bowel habits 08/20/2014   Cardiac failure ([place]) 06/26/2014   ILD (interstitial lung disease) ([place]) 06/26/2014   Upper airway cough syndrome 04/21/2014   DOE (dyspnea on exertion) 04/17/2014   Sinus tachycardia 09/62/8366   Acute diastolic heart failure ([place]) 03/28/2013   Bronchitis 03/14/2013   Chest tightness 03/14/2013   Interstitial pneumonitis ([place]) 03/14/2013   Fibrosis of lung ([place]) 03/14/2013   Family history of malignant neoplasm of gastrointestinal tract 05/06/2011   STRICTURE AND STENOSIS OF ESOPHAGUS 05/27/2009   OSTEOPOROSIS 10/16/2008   EDEMA 10/16/2008   Hypothyroidism 05/09/2007   Hyperlipidemia 05/09/2007   GERD (gastroesophageal  reflux disease) 05/09/2007   COLITIS 05/09/2007   Left sided ulcerative colitis ([place]) 08/07/2005   DIVERTICULOSIS, COLON 05/15/2004    Past Surgical History:  Procedure Laterality Date   ABDOMINAL HYSTERECTOMY     CATARACT EXTRACTION W/PHACO Bilateral    Dr. Gershon Crane   CHOLECYSTECTOMY     FOOT SURGERY     THYROIDECTOMY, PARTIAL     YAG LASER APPLICATION Right  05/16/5168   Procedure: YAG LASER APPLICATION;  Surgeon: Elta Guadeloupe T. Gershon Crane, MD;  Location: [facility];  Service: Ophthalmology;  Laterality: Right;     OB History     Gravida  3   Para  2   Term  2   Preterm      AB  1   Living         SAB  1   IAB      Ectopic      Multiple      Live Births              Family History  Problem Relation Age of Onset   Colon cancer Mother    Heart attack Father        CHF   Stomach cancer Sister    Diabetes Son    Kidney cancer Son    Adrenal disorder Son    Heart disease Brother     Social History   Tobacco Use   Smoking status: Never   Smokeless tobacco: Never  Vaping Use   Vaping Use: Never used  Substance Use Topics   Alcohol use: No    Alcohol/week: 0.0 standard drinks   Drug use: No    Home Medications Prior to Admission medications   Medication Sig Start Date End Date Taking? Authorizing Provider  acetaminophen (TYLENOL) 500 MG tablet Take 1,000 mg by mouth as needed.    [provider]  albuterol (PROVENTIL) (2.5 MG/3ML) 0.083% nebulizer solution Take 3 mLs by nebulization 4 (four) times daily as needed. 10/18/19   [provider]  ALPRAZolam Duanne Moron) 0.5 MG tablet Take 1 tablet (0.5 mg total) by mouth at bedtime as needed for anxiety. 03/15/13   Robbie Lis, MD  calcium carbonate (TUMS - DOSED IN MG ELEMENTAL CALCIUM) 500 MG chewable tablet Chew 1 tablet by mouth daily.    [provider]  esomeprazole (NEXIUM) 40 MG capsule Take 1 capsule by mouth every evening. 07/30/20   [provider]  fentaNYL (DURAGESIC) 25 MCG/HR Place 1 patch onto the skin every 3 (three) days. 06/15/19   [provider]  levothyroxine (SYNTHROID) 112 MCG tablet Take 112 mcg by mouth daily. 06/13/20   [provider]  metoprolol succinate (TOPROL-XL) 25 MG 24 hr tablet Take 0.5-1 tablets (12.5-25 mg total) by mouth in the morning and at bedtime. Patient takes 1 tablet (33m) in the  morning and 1/2 of a tablet (12.517m in the evening 07/10/19   MaBarton DuboisMD  Multiple Vitamins-Minerals (VISION VITAMINS) TABS Take 1 tablet by mouth daily.    [provider]  nitrofurantoin, macrocrystal-monohydrate, (MACROBID) 100 MG capsule Take 100 mg by mouth 2 (two) times daily. 08/14/20   [provider]  ondansetron (ZOFRAN) 4 MG tablet Take 4 mg by mouth every 8 (eight) hours as needed. 08/03/19   [provider]  Polyethyl Glycol-Propyl Glycol (SYSTANE OP) Apply 1 drop to eye daily.    [provider]  potassium chloride SA (KLOR-CON) 20 MEQ tablet Take 20 mEq by mouth daily. 05/22/20  [provider]  senna (SENOKOT) 8.6 MG tablet Take 2 tablets by mouth as needed for constipation.    [provider]  sodium chloride (OCEAN) 0.65 % nasal spray Place 2 sprays into the nose daily as needed.     [provider]  torsemide (DEMADEX) 20 MG tablet Take 20 mg by mouth daily. 03/28/19   [provider]  warfarin (COUMADIN) 2.5 MG tablet Take 1 tablet (2.5 mg total) by mouth one time only at 4 PM. 08/19/20 11/17/20  Deatra James, MD    Allergies    Nebivolol hcl, Codeine, Sulfa antibiotics, and Sulfonamide derivatives  Review of Systems   Review of Systems  Constitutional:  Negative for chills and fever.  HENT:  Negative for ear pain and sore throat.   Eyes:  Negative for photophobia, pain and visual disturbance.  Respiratory:  Negative for cough and shortness of breath.   Cardiovascular:  Negative for chest pain and palpitations.  Gastrointestinal:  Negative for abdominal pain and vomiting.  Genitourinary:  Negative for dysuria and hematuria.  Musculoskeletal:  Positive for arthralgias and myalgias. Negative for back pain and neck pain.  Skin:  Positive for wound. Negative for color change and rash.  Neurological:  Negative for seizures, syncope, numbness and headaches.  All other systems reviewed and are  negative.  Physical Exam Updated Vital Signs BP 117/67 (BP Location: Left Arm)   Pulse 87   Temp 98.7 F (37.1 C) (Oral)   Resp 16   Ht 5' 8"  (1.727 m)   Wt 55.3 kg   SpO2 100%   BMI 18.55 kg/m   Physical Exam Vitals and nursing note reviewed.  Constitutional:      General: She is not in acute distress.    Appearance: She is not toxic-appearing.  HENT:     Head: Normocephalic.     Comments: Approxiamtely 2.5cm laceration lateral to right eye brow. Bandage was placed and bleeding has subsided. No step offs or deformities noted.      Mouth/Throat:     Mouth: Mucous membranes are moist.  Eyes:     General: No scleral icterus.    Extraocular Movements: Extraocular movements intact.     Pupils: Pupils are equal, round, and reactive to light.  Neck:     Comments: No step offs or deformities noted.  Cardiovascular:     Rate and Rhythm: Normal rate and regular rhythm.     Heart sounds: No murmur heard. Pulmonary:     Effort: Pulmonary effort is normal. No respiratory distress.     Breath sounds: Normal breath sounds. No wheezing.  Chest:     Chest wall: No tenderness.  Abdominal:     General: Abdomen is flat. Bowel sounds are normal.     Palpations: Abdomen is soft.     Tenderness: There is no abdominal tenderness. There is no guarding or rebound.  Musculoskeletal:        General: No deformity.     Cervical back: Normal range of motion and neck supple. No tenderness.     Comments: No midline or paraspinal tenderness in cervical, thoracic, lumbar, or sacral. Mild right SI tenderness to palpation.   Right wrist - no snuffbox tenderness. Small, soft, hematoma on dorsum of right wrist. No overlying abrasion or skin tears. No obvious deformities.  Good radial pulses.  Sensation intact.  Limited ROM of right hip and knee secondary to pain.   BLE pedal pulses auscultated with doppler.   Skin:  General: Skin is warm and dry.     Findings: Bruising present.     Comments:  Ecchymosis to anterior shins bilaterally of unknown duration. Large bruise to lateral right knee.   Neurological:     General: No focal deficit present.     Mental Status: She is alert and oriented to person, place, and time. Mental status is at baseline.     Cranial Nerves: No cranial nerve deficit.     Sensory: No sensory deficit.     Motor: No weakness.    ED Results / Procedures / Treatments   Labs (all labs ordered are listed, but only abnormal results are displayed) Labs Reviewed - No data to display   EKG None  Radiology DG Chest 1 View  Result Date: 01/09/2021 CLINICAL DATA:  Recent fall with chest pain, initial encounter EXAM: CHEST  1 VIEW COMPARISON:  08/16/2020 FINDINGS: Cardiac shadow is enlarged but stable. Pacing device is again seen. Lungs are hyperinflated but stable. Chronic fibrotic changes are noted bilaterally without acute infiltrate. IMPRESSION: Chronic fibrotic changes without acute abnormality. Electronically Signed   By: Inez Catalina M.D.   On: 01/09/2021 19:11   DG Wrist Complete Right  Result Date: 01/09/2021 CLINICAL DATA:  Right hand and wrist pain after a fall. EXAM: RIGHT WRIST - COMPLETE 3+ VIEW COMPARISON:  02/18/2004 FINDINGS: Postoperative changes with plate and screw fixation of an old fracture deformity of the distal right radius. Old deformity of the ulnar styloid process. Degenerative changes in the right wrist with involvement of the radiocarpal, STT, and first carpometacarpal joints. Old ununited ossicles at the first carpometacarpal joint. No acute fracture or dislocation is suggested. Dorsal soft tissue swelling. Calcification in the triangular fibrocartilage. IMPRESSION: Postoperative changes and old fracture deformities of the distal radius and ulna. Degenerative changes in the carpus. No acute bony abnormalities identified. Electronically Signed   By: Lucienne Capers M.D.   On: 01/09/2021 19:12   CT HEAD WO CONTRAST (5MM)  Result Date:  01/09/2021 CLINICAL DATA:  Head trauma, minor (Age >= 65y) EXAM: CT HEAD WITHOUT CONTRAST CT CERVICAL SPINE WITHOUT CONTRAST TECHNIQUE: Multidetector CT imaging of the head and cervical spine was performed following the standard protocol without intravenous contrast. Multiplanar CT image reconstructions of the cervical spine were also generated. COMPARISON:  None. FINDINGS: CT HEAD FINDINGS Brain: No evidence of acute infarction, hemorrhage, hydrocephalus, extra-axial collection or mass lesion/mass effect. Moderate atrophy with prominence of the extra-axial spaces Vascular: No hyperdense vessel identified. Calcific intracranial atherosclerosis. Skull: No evidence of acute fracture. Right periorbital/cheek contusion without acute fracture. Sinuses/Orbits: Right maxillary antrostomy with bony thickening of the right maxillary sinus and mild mucosal thickening, suggestive of maxillary chronic sinusitis. No acute orbital findings. CT CERVICAL SPINE FINDINGS Alignment: No substantial sagittal subluxation. Skull base and vertebrae: Vertebral body heights are maintained. No evidence of acute fracture. Small limbus vertebra along the anterior/inferior C6 vertebral body. Osteopenia. Soft tissues and spinal canal: No prevertebral fluid or swelling. No visible canal hematoma. Disc levels: Posterior disc bulges at multiple levels without evidence of high-grade canal stenosis. Intervertebral disc heights are largely maintained. Mild multilevel facet arthropathy. Upper chest: Biapical pleuroparenchymal scarring with interlobular septal thickening. There is also new areas of ground-glass opacity within the partially imaged left upper lung. Other: Absent right thyroid. IMPRESSION: CT head: 1. No evidence of acute intracranial abnormality. 2. Right periorbital/cheek contusion without acute fracture. 3. Moderate atrophy. CT cervical spine: 1. No evidence of acute fracture or traumatic malalignment. 2.  In comparison to August 16, 2020  CTA chest, similar biapical pleuroparenchymal scarring and interlobular septal thickening. New groundglass opacities in the partially imaged left upper lobe, could represent progression of the same chronic process or acute superimposed infection. Consider correlation with dedicated chest imaging. Electronically Signed   By: Margaretha Sheffield M.D.   On: 01/09/2021 18:26   CT Cervical Spine Wo Contrast  Result Date: 01/09/2021 CLINICAL DATA:  Head trauma, minor (Age >= 65y) EXAM: CT HEAD WITHOUT CONTRAST CT CERVICAL SPINE WITHOUT CONTRAST TECHNIQUE: Multidetector CT imaging of the head and cervical spine was performed following the standard protocol without intravenous contrast. Multiplanar CT image reconstructions of the cervical spine were also generated. COMPARISON:  None. FINDINGS: CT HEAD FINDINGS Brain: No evidence of acute infarction, hemorrhage, hydrocephalus, extra-axial collection or mass lesion/mass effect. Moderate atrophy with prominence of the extra-axial spaces Vascular: No hyperdense vessel identified. Calcific intracranial atherosclerosis. Skull: No evidence of acute fracture. Right periorbital/cheek contusion without acute fracture. Sinuses/Orbits: Right maxillary antrostomy with bony thickening of the right maxillary sinus and mild mucosal thickening, suggestive of maxillary chronic sinusitis. No acute orbital findings. CT CERVICAL SPINE FINDINGS Alignment: No substantial sagittal subluxation. Skull base and vertebrae: Vertebral body heights are maintained. No evidence of acute fracture. Small limbus vertebra along the anterior/inferior C6 vertebral body. Osteopenia. Soft tissues and spinal canal: No prevertebral fluid or swelling. No visible canal hematoma. Disc levels: Posterior disc bulges at multiple levels without evidence of high-grade canal stenosis. Intervertebral disc heights are largely maintained. Mild multilevel facet arthropathy. Upper chest: Biapical pleuroparenchymal scarring with  interlobular septal thickening. There is also new areas of ground-glass opacity within the partially imaged left upper lung. Other: Absent right thyroid. IMPRESSION: CT head: 1. No evidence of acute intracranial abnormality. 2. Right periorbital/cheek contusion without acute fracture. 3. Moderate atrophy. CT cervical spine: 1. No evidence of acute fracture or traumatic malalignment. 2. In comparison to August 16, 2020 CTA chest, similar biapical pleuroparenchymal scarring and interlobular septal thickening. New groundglass opacities in the partially imaged left upper lobe, could represent progression of the same chronic process or acute superimposed infection. Consider correlation with dedicated chest imaging. Electronically Signed   By: Margaretha Sheffield M.D.   On: 01/09/2021 18:26   DG Knee Complete 4 Views Right  Result Date: 01/09/2021 CLINICAL DATA:  Fall. EXAM: RIGHT HAND - COMPLETE 3+ VIEW; RIGHT KNEE - COMPLETE 4+ VIEW COMPARISON:  Right knee x-ray 07/21/2013. FINDINGS: Right knee: There is no joint effusion. There is no acute fracture or dislocation. There are new lateral compartment degenerative changes with joint space narrowing, osteophyte formation and chondrocalcinosis. Nonspecific soft tissue calcifications are noted at the insertion of the quadriceps tendon. Right wrist: There is a dorsal sideplate and multiple screws within the distal radius. There is no evidence for hardware loosening. There is no acute fracture or dislocation identified. There is degenerative narrowing of the radiocarpal joint. There is also calcification of the triangular fibrocartilage, degenerative. There is soft tissue swelling surrounding the wrist. There also degenerative changes at the first carpometacarpal joint. IMPRESSION: 1. No acute fracture or dislocation of the right wrist or right knee. 2. Degenerative changes of the right wrist and right knee. Electronically Signed   By: Ronney Asters M.D.   On: 01/09/2021 19:12    DG Hand Complete Right  Result Date: 01/09/2021 CLINICAL DATA:  Fall. EXAM: RIGHT HAND - COMPLETE 3+ VIEW; RIGHT KNEE - COMPLETE 4+ VIEW COMPARISON:  Right knee x-ray 07/21/2013. FINDINGS: Right knee:  There is no joint effusion. There is no acute fracture or dislocation. There are new lateral compartment degenerative changes with joint space narrowing, osteophyte formation and chondrocalcinosis. Nonspecific soft tissue calcifications are noted at the insertion of the quadriceps tendon. Right wrist: There is a dorsal sideplate and multiple screws within the distal radius. There is no evidence for hardware loosening. There is no acute fracture or dislocation identified. There is degenerative narrowing of the radiocarpal joint. There is also calcification of the triangular fibrocartilage, degenerative. There is soft tissue swelling surrounding the wrist. There also degenerative changes at the first carpometacarpal joint. IMPRESSION: 1. No acute fracture or dislocation of the right wrist or right knee. 2. Degenerative changes of the right wrist and right knee. Electronically Signed   By: Ronney Asters M.D.   On: 01/09/2021 19:12   DG Hip Unilat W or Wo Pelvis 2-3 Views Right  Result Date: 01/09/2021 CLINICAL DATA:  Recent fall with right-sided hip pain, initial encounter EXAM: DG HIP (WITH OR WITHOUT PELVIS) 3V RIGHT COMPARISON:  None. FINDINGS: Pelvic ring is intact. Degenerative changes of lumbar spine are noted. No acute fracture or dislocation is noted. No soft tissue abnormality is seen. IMPRESSION: No acute abnormality noted. Electronically Signed   By: Inez Catalina M.D.   On: 01/09/2021 19:10    Procedures .Marland KitchenLaceration Repair  Date/Time: 01/10/2021 1:01 AM Performed by: Sherrell Puller, PA-C Authorized by: Sherrell Puller, PA-C   Consent:    Consent obtained:  Verbal   Consent given by:  Patient   Risks discussed:  Infection, need for additional repair, pain, poor cosmetic result and poor wound  healing   Alternatives discussed:  No treatment and delayed treatment Universal protocol:    Procedure explained and questions answered to patient or proxy's satisfaction: yes     Relevant documents present and verified: no     Test results available: no     Imaging studies available: no     Required blood products, implants, devices, and special equipment available: no     Site/side marked: no     Immediately prior to procedure, a time out was called: no     Patient identity confirmed:  Verbally with patient Anesthesia:    Anesthesia method:  Local infiltration   Local anesthetic:  Lidocaine 1% w/o epi Laceration details:    Location:  Face   Face location:  R eyebrow   Length (cm):  2.5   Depth (mm):  2 Pre-procedure details:    Preparation:  Patient was prepped and draped in usual sterile fashion Exploration:    Limited defect created (wound extended): no     Hemostasis achieved with:  Direct pressure   Wound exploration: entire depth of wound visualized     Wound extent: no muscle damage noted     Contaminated: no   Treatment:    Area cleansed with:  Saline   Amount of cleaning:  Extensive   Irrigation solution:  Sterile saline   Irrigation method:  Pressure wash and tap   Visualized foreign bodies/material removed: no     Debridement:  None   Undermining:  None   Scar revision: no   Skin repair:    Repair method:  Sutures   Suture size:  6-0   Suture material:  Prolene   Suture technique:  Simple interrupted   Number of sutures:  5 Approximation:    Approximation:  Close Repair type:    Repair type:  Simple Post-procedure details:  Dressing:  Non-adherent dressing and sterile dressing   Procedure completion:  Tolerated well, no immediate complications   Medications Ordered in ED Medications  ondansetron (ZOFRAN-ODT) disintegrating tablet 4 mg (4 mg Oral Given 01/09/21 1949)  HYDROcodone-acetaminophen (NORCO/VICODIN) 5-325 MG per tablet 1 tablet (1 tablet  Oral Given 01/09/21 1949)  lidocaine (PF) (XYLOCAINE) 1 % injection 5 mL (5 mLs Infiltration Given 01/09/21 2020)  Tdap (BOOSTRIX) injection 0.5 mL (0.5 mLs Intramuscular Given 01/09/21 2020)    ED Course  I have reviewed the triage vital signs and the nursing notes.  Pertinent labs & imaging results that were available during my care of the patient were reviewed by me and considered in my medical decision making (see chart for details).  Cindy [name] is a 87 y/o F presenting to the ED for evaluation of head laceration, right wrist pain, right hip pain, and right knee pain after a mechanical ground level fall just pta. Differential diagnosis includes, but is not limited to, ICH, SAH, c-spine fracture,  pneumothorax, fracture, dislocation.  C-collar placed until imaging cleared.  I personally reviewed and interpreted the patients imaging. CT head and C-spine do not show a bleed or fracture. No evidence of acute fracture or traumatic malalignment. XR of hip shows no acute abnormality.  Chest x-ray shows chronic fibrotic changes without acute normalities.  Right wrist shows orthopedic hardware, postoperative changes, and old fracture deformities of the distal radius and ulna.  Right knee x-ray shows degenerative changes, but no acute fracture or dislocation.  Upon reevaluation, the patient reports she still having pain in her right hand, but has lessened with the medication.  I discussed and reviewed the imaging with the patient.  Furthermore, I called the patient's son per her request and updated him on imaging.  Patient tolerated laceration repair well. See procedure note for more information.   Patient was experiencing mild desaturation to 88% with good wave.  Upon further investigation, the patient mentions that this happens occasionally at home and she has as needed home oxygen.  She denies feeling short of breath.  Respiratory rate is 16.  Patient speaking in full sentences with ease.  She  was placed on 2 L nasal cannula and was satting 97%.  I discussed suture care with patient and advised to return to the ED or her PCP within 5 to 7 days for suture removal.  Recommended Tylenol for pain and the RICE method for wrist, knee, hip pain.  Return precautions given.  Patient agrees with plan.  Patient's son, Sherren Mocha, will be picking up the patient.  Patient is stable and being discharged home in good condition.     MDM Rules/Calculators/A&P  Final Clinical Impression(s) / ED Diagnoses Final diagnoses:  Fall, initial encounter  Laceration of right periocular area without foreign body, initial encounter  Right wrist pain  Right hip pain  Acute pain of right knee    Rx / DC Orders ED Discharge Orders     None        Sherrell Puller, Hershal Coria 01/10/21 0112    Noemi Chapel, MD 01/12/21 1517

## 2021-01-09 NOTE — ED Notes (Signed)
Pt having MSE at this time

## 2021-01-09 NOTE — ED Triage Notes (Signed)
Pt arrived via [facility] from home. Pt fell d/t her husband falling into her. Pt fell on her right side. C/o of right hip, and wrist pain. 1 1/2in lac above right eye per EMS. Pt does take thinners and denies LOC

## 2021-01-09 NOTE — Discharge Instructions (Addendum)
Please return to the ED or to your primary care office for suture removal after 5-7 days. Attached is information on laceration care. Please take Tylenol 1025m every 6 hours as needed for pain. Please return to the ED for any new or worsening symptoms.

## 2021-01-16 DIAGNOSIS — M545 Low back pain, unspecified: Secondary | ICD-10-CM | POA: Diagnosis not present

## 2021-01-16 DIAGNOSIS — S0101XD Laceration without foreign body of scalp, subsequent encounter: Secondary | ICD-10-CM | POA: Diagnosis not present

## 2021-01-16 DIAGNOSIS — Z9181 History of falling: Secondary | ICD-10-CM | POA: Diagnosis not present

## 2021-01-16 DIAGNOSIS — Z4802 Encounter for removal of sutures: Secondary | ICD-10-CM | POA: Diagnosis not present

## 2021-01-17 DIAGNOSIS — K219 Gastro-esophageal reflux disease without esophagitis: Secondary | ICD-10-CM | POA: Diagnosis not present

## 2021-01-17 DIAGNOSIS — E1165 Type 2 diabetes mellitus with hyperglycemia: Secondary | ICD-10-CM | POA: Diagnosis not present

## 2021-01-29 DIAGNOSIS — Z23 Encounter for immunization: Secondary | ICD-10-CM | POA: Diagnosis not present

## 2021-01-30 DIAGNOSIS — Z950 Presence of cardiac pacemaker: Secondary | ICD-10-CM | POA: Diagnosis not present

## 2021-01-31 DIAGNOSIS — R7303 Prediabetes: Secondary | ICD-10-CM | POA: Diagnosis not present

## 2021-01-31 DIAGNOSIS — E782 Mixed hyperlipidemia: Secondary | ICD-10-CM | POA: Diagnosis not present

## 2021-01-31 DIAGNOSIS — E039 Hypothyroidism, unspecified: Secondary | ICD-10-CM | POA: Diagnosis not present

## 2021-02-01 DIAGNOSIS — Z950 Presence of cardiac pacemaker: Secondary | ICD-10-CM | POA: Diagnosis not present

## 2021-02-05 ENCOUNTER — Other Ambulatory Visit (HOSPITAL_COMMUNITY): Payer: Self-pay | Admitting: Family Medicine

## 2021-02-05 ENCOUNTER — Other Ambulatory Visit: Payer: Self-pay | Admitting: Family Medicine

## 2021-02-05 DIAGNOSIS — I4891 Unspecified atrial fibrillation: Secondary | ICD-10-CM | POA: Diagnosis not present

## 2021-02-05 DIAGNOSIS — R7303 Prediabetes: Secondary | ICD-10-CM | POA: Diagnosis not present

## 2021-02-05 DIAGNOSIS — R748 Abnormal levels of other serum enzymes: Secondary | ICD-10-CM

## 2021-02-05 DIAGNOSIS — I509 Heart failure, unspecified: Secondary | ICD-10-CM | POA: Diagnosis not present

## 2021-02-05 DIAGNOSIS — G894 Chronic pain syndrome: Secondary | ICD-10-CM | POA: Diagnosis not present

## 2021-02-05 DIAGNOSIS — E039 Hypothyroidism, unspecified: Secondary | ICD-10-CM | POA: Diagnosis not present

## 2021-02-05 DIAGNOSIS — E859 Amyloidosis, unspecified: Secondary | ICD-10-CM | POA: Diagnosis not present

## 2021-02-05 DIAGNOSIS — E782 Mixed hyperlipidemia: Secondary | ICD-10-CM | POA: Diagnosis not present

## 2021-02-05 DIAGNOSIS — Z0001 Encounter for general adult medical examination with abnormal findings: Secondary | ICD-10-CM | POA: Diagnosis not present

## 2021-02-05 DIAGNOSIS — T148XXD Other injury of unspecified body region, subsequent encounter: Secondary | ICD-10-CM | POA: Diagnosis not present

## 2021-02-12 ENCOUNTER — Other Ambulatory Visit (HOSPITAL_COMMUNITY): Payer: Self-pay | Admitting: Family Medicine

## 2021-02-12 DIAGNOSIS — M545 Low back pain, unspecified: Secondary | ICD-10-CM | POA: Diagnosis not present

## 2021-02-12 DIAGNOSIS — R600 Localized edema: Secondary | ICD-10-CM | POA: Diagnosis not present

## 2021-02-12 DIAGNOSIS — T148XXD Other injury of unspecified body region, subsequent encounter: Secondary | ICD-10-CM | POA: Diagnosis not present

## 2021-02-13 ENCOUNTER — Other Ambulatory Visit: Payer: Self-pay

## 2021-02-13 ENCOUNTER — Ambulatory Visit (HOSPITAL_COMMUNITY)
Admission: RE | Admit: 2021-02-13 | Discharge: 2021-02-13 | Disposition: A | Discharge status: Home / Self Care | Payer: Medicare Other | Source: Ambulatory Visit | Attending: Family Medicine | Admitting: Family Medicine

## 2021-02-13 DIAGNOSIS — M545 Low back pain, unspecified: Secondary | ICD-10-CM | POA: Diagnosis not present

## 2021-02-14 ENCOUNTER — Ambulatory Visit (HOSPITAL_COMMUNITY)
Admission: RE | Admit: 2021-02-14 | Discharge: 2021-02-14 | Disposition: A | Discharge status: Home / Self Care | Payer: Medicare Other | Source: Ambulatory Visit | Attending: Family Medicine | Admitting: Family Medicine

## 2021-02-14 DIAGNOSIS — R748 Abnormal levels of other serum enzymes: Secondary | ICD-10-CM | POA: Insufficient documentation

## 2021-02-14 DIAGNOSIS — Z9049 Acquired absence of other specified parts of digestive tract: Secondary | ICD-10-CM | POA: Diagnosis not present

## 2021-02-14 DIAGNOSIS — N281 Cyst of kidney, acquired: Secondary | ICD-10-CM | POA: Diagnosis not present

## 2021-02-17 DIAGNOSIS — K219 Gastro-esophageal reflux disease without esophagitis: Secondary | ICD-10-CM | POA: Diagnosis not present

## 2021-02-17 DIAGNOSIS — E1165 Type 2 diabetes mellitus with hyperglycemia: Secondary | ICD-10-CM | POA: Diagnosis not present

## 2021-02-26 DIAGNOSIS — R600 Localized edema: Secondary | ICD-10-CM | POA: Diagnosis not present

## 2021-02-26 DIAGNOSIS — T148XXD Other injury of unspecified body region, subsequent encounter: Secondary | ICD-10-CM | POA: Diagnosis not present

## 2021-02-26 DIAGNOSIS — S32030A Wedge compression fracture of third lumbar vertebra, initial encounter for closed fracture: Secondary | ICD-10-CM | POA: Diagnosis not present

## 2021-02-26 DIAGNOSIS — M545 Low back pain, unspecified: Secondary | ICD-10-CM | POA: Diagnosis not present

## 2021-03-04 ENCOUNTER — Other Ambulatory Visit (HOSPITAL_COMMUNITY): Payer: Self-pay | Admitting: Neurosurgery

## 2021-03-04 ENCOUNTER — Other Ambulatory Visit: Payer: Self-pay | Admitting: Neurosurgery

## 2021-03-04 DIAGNOSIS — S32030A Wedge compression fracture of third lumbar vertebra, initial encounter for closed fracture: Secondary | ICD-10-CM

## 2021-03-05 ENCOUNTER — Encounter (INDEPENDENT_AMBULATORY_CARE_PROVIDER_SITE_OTHER): Payer: Medicare Other | Admitting: Ophthalmology

## 2021-03-06 ENCOUNTER — Other Ambulatory Visit (HOSPITAL_COMMUNITY): Payer: Self-pay | Admitting: Neurosurgery

## 2021-03-06 DIAGNOSIS — S32030A Wedge compression fracture of third lumbar vertebra, initial encounter for closed fracture: Secondary | ICD-10-CM

## 2021-03-10 ENCOUNTER — Encounter (INDEPENDENT_AMBULATORY_CARE_PROVIDER_SITE_OTHER): Payer: Self-pay | Admitting: Ophthalmology

## 2021-03-10 ENCOUNTER — Other Ambulatory Visit: Payer: Self-pay

## 2021-03-10 ENCOUNTER — Ambulatory Visit (INDEPENDENT_AMBULATORY_CARE_PROVIDER_SITE_OTHER): Payer: Medicare Other | Admitting: Ophthalmology

## 2021-03-10 ENCOUNTER — Encounter (INDEPENDENT_AMBULATORY_CARE_PROVIDER_SITE_OTHER): Payer: Medicare Other | Admitting: Ophthalmology

## 2021-03-10 DIAGNOSIS — H353221 Exudative age-related macular degeneration, left eye, with active choroidal neovascularization: Secondary | ICD-10-CM

## 2021-03-10 DIAGNOSIS — H353133 Nonexudative age-related macular degeneration, bilateral, advanced atrophic without subfoveal involvement: Secondary | ICD-10-CM

## 2021-03-10 DIAGNOSIS — H35352 Cystoid macular degeneration, left eye: Secondary | ICD-10-CM | POA: Diagnosis not present

## 2021-03-10 MED ORDER — BEVACIZUMAB 2.5 MG/0.1ML IZ SOSY
2.5000 mg | PREFILLED_SYRINGE | INTRAVITREAL | Status: AC | PRN
  Administered 2021-03-10: 2.5 mg via INTRAVITREAL

## 2021-03-10 NOTE — Assessment & Plan Note (Signed)
As part of wet AMD improving

## 2021-03-10 NOTE — Assessment & Plan Note (Signed)
No signs of CNVM OD

## 2021-03-10 NOTE — Assessment & Plan Note (Signed)
The nature of wet macular degeneration was discussed with the patient.  Forms of therapy reviewed include the use of Anti-VEGF medications injected painlessly into the eye, as well as other possible treatment modalities, including thermal laser therapy. Fellow eye involvement and risks were discussed with the patient. Upon the finding of wet age related macular degeneration, treatment will be offered. The treatment regimen is on a treat as needed basis with the intent to treat if necessary and extend interval of exams when possible. On average 1 out of 6 patients do not need lifetime therapy. However, the risk of recurrent disease is high for a lifetime.  Initially monthly, then periodic, examinations and evaluations will determine whether the next treatment is required on the day of the examination.  OS with less intraretinal fluid as compared to January 2022, currently at 10-week follow-up interval.  Repeat injection OS today and examination again we will follow-up again, 10 weeks

## 2021-03-10 NOTE — Progress Notes (Signed)
03/10/2021     CHIEF COMPLAINT Patient presents for  Chief Complaint  Patient presents with   Retina Follow Up      HISTORY OF PRESENT ILLNESS: Cindy [name] is a 87 y.o. female who presents to the clinic today for:   HPI     Retina Follow Up   Patient presents with  Wet AMD.  In both eyes.  This started 9.5 weeks ago.  Severity is mild.  Duration of 9.5 weeks.  Since onset it is stable.        Comments   9 week fu OU oct Avastin OS. Patient states vision is stable and unchanged since last visit. Denies any new floaters or FOL. Pt states "I have to use readers to see fine print."        Last edited by Laurin Coder on 03/10/2021 10:12 AM.      Referring physician: Celene Squibb, MD [place],  [place] [address]  HISTORICAL INFORMATION:   Selected notes from the MEDICAL RECORD NUMBER    Lab Results  Component Value Date   HGBA1C 6.1 (H) 03/14/2013     CURRENT MEDICATIONS: Current Outpatient Medications (Ophthalmic Drugs)  Medication Sig   Polyethyl Glycol-Propyl Glycol (SYSTANE OP) Apply 1 drop to eye daily.   No current facility-administered medications for this visit. (Ophthalmic Drugs)   Current Outpatient Medications (Other)  Medication Sig   acetaminophen (TYLENOL) 500 MG tablet Take 1,000 mg by mouth as needed.   albuterol (PROVENTIL) (2.5 MG/3ML) 0.083% nebulizer solution Take 3 mLs by nebulization 4 (four) times daily as needed.   ALPRAZolam (XANAX) 0.5 MG tablet Take 1 tablet (0.5 mg total) by mouth at bedtime as needed for anxiety.   calcium carbonate (TUMS - DOSED IN MG ELEMENTAL CALCIUM) 500 MG chewable tablet Chew 1 tablet by mouth daily.   esomeprazole (NEXIUM) 40 MG capsule Take 1 capsule by mouth every evening.   fentaNYL (DURAGESIC) 25 MCG/HR Place 1 patch onto the skin every 3 (three) days.   levothyroxine (SYNTHROID) 112 MCG tablet Take 112 mcg by mouth daily.   metoprolol succinate (TOPROL-XL) 25 MG 24 hr  tablet Take 0.5-1 tablets (12.5-25 mg total) by mouth in the morning and at bedtime. Patient takes 1 tablet (62m) in the morning and 1/2 of a tablet (12.559m in the evening   Multiple Vitamins-Minerals (VISION VITAMINS) TABS Take 1 tablet by mouth daily.   nitrofurantoin, macrocrystal-monohydrate, (MACROBID) 100 MG capsule Take 100 mg by mouth 2 (two) times daily.   ondansetron (ZOFRAN) 4 MG tablet Take 4 mg by mouth every 8 (eight) hours as needed.   potassium chloride SA (KLOR-CON) 20 MEQ tablet Take 20 mEq by mouth daily.   senna (SENOKOT) 8.6 MG tablet Take 2 tablets by mouth as needed for constipation.   sodium chloride (OCEAN) 0.65 % nasal spray Place 2 sprays into the nose daily as needed.    torsemide (DEMADEX) 20 MG tablet Take 20 mg by mouth daily.   warfarin (COUMADIN) 2.5 MG tablet Take 1 tablet (2.5 mg total) by mouth one time only at 4 PM.   No current facility-administered medications for this visit. (Other)      REVIEW OF SYSTEMS:    ALLERGIES Allergies  Allergen Reactions   Nebivolol Hcl     Other reaction(s): Other (See Comments) Chest tightness,sob,cough,severe tiredness,confusion   Codeine Nausea Only   Sulfa Antibiotics Nausea Only   Sulfonamide Derivatives Nausea Only  PAST MEDICAL HISTORY Past Medical History:  Diagnosis Date   Cancer [facility])    CHF (congestive heart failure) ([place])    Amyloidosis   Diarrhea    Diverticulosis of colon (without mention of hemorrhage)    Edema    Esophagitis, unspecified    Family history of malignant neoplasm of gastrointestinal tract    Fibrosis of lung ([place]) 03/14/2013   Flatulence, eructation, and gas pain    GERD (gastroesophageal reflux disease)    Left sided ulcerative colitis ([place])    Maxillary sinus mass    Osteoporosis, unspecified    Other and unspecified hyperlipidemia    Other and unspecified noninfectious gastroenteritis and colitis(558.9)    Paroxysmal atrial fibrillation ([place]) 07/09/2019    Stricture and stenosis of esophagus    Unspecified hypothyroidism    URI (upper respiratory infection)    Past Surgical History:  Procedure Laterality Date   ABDOMINAL HYSTERECTOMY     CATARACT EXTRACTION W/PHACO Bilateral    Dr. Gershon Crane   CHOLECYSTECTOMY     FOOT SURGERY     THYROIDECTOMY, PARTIAL     YAG LASER APPLICATION Right 2/83/1517   Procedure: YAG LASER APPLICATION;  Surgeon: Elta Guadeloupe T. Gershon Crane, MD;  Location: [facility];  Service: Ophthalmology;  Laterality: Right;    FAMILY HISTORY Family History  Problem Relation Age of Onset   Colon cancer Mother    Heart attack Father        CHF   Stomach cancer Sister    Diabetes Son    Kidney cancer Son    Adrenal disorder Son    Heart disease Brother     SOCIAL HISTORY Social History   Tobacco Use   Smoking status: Never   Smokeless tobacco: Never  Vaping Use   Vaping Use: Never used  Substance Use Topics   Alcohol use: No    Alcohol/week: 0.0 standard drinks   Drug use: No         OPHTHALMIC EXAM:  Base Eye Exam     Visual Acuity (ETDRS)       Right Left   Dist [place] 20/40 20/200   Dist ph   20/80 -1         Tonometry (Tonopen, 10:15 AM)       Right Left   Pressure 12 11         Pupils       Pupils Dark Light APD   Right PERRL 4 3 None   Left PERRL 4 3 None         Extraocular Movement       Right Left    Full Full         Neuro/Psych     Oriented x3: Yes   Mood/Affect: Normal         Dilation     Both eyes: 1.0% Mydriacyl, 2.5% Phenylephrine @ 10:15 AM           Slit Lamp and Fundus Exam     External Exam       Right Left   External Normal Normal         Slit Lamp Exam       Right Left   Lids/Lashes Normal Normal   Conjunctiva/Sclera White and quiet White and quiet   Cornea Clear Clear   Anterior Chamber Deep and quiet Deep and quiet   Iris Round and reactive Round and reactive   Lens  Posterior chamber intraocular lens   Anterior Vitreous Normal Normal  Fundus Exam       Right Left   Posterior Vitreous Posterior vitreous detachment ,,, Posterior vitreous detachment   Disc Normal Normal   C/D Ratio 0.2 0.1   Macula Hard drusen, Pigmented atrophy Atrophy, Age related macular degeneration, Early age related macular degeneration, Drusen, Macular thickening, dry atrophic change in the macula    Vessels Normal Normal   Periphery Normal Normal            IMAGING AND PROCEDURES  Imaging and Procedures for 03/10/21  Intravitreal Injection, Pharmacologic Agent - OS - Left Eye       Time Out 03/10/2021. 10:49 AM. Confirmed correct patient, procedure, site, and patient consented.   Anesthesia Topical anesthesia was used. Anesthetic medications included Lidocaine 4%.   Procedure Preparation included 10% betadine to eyelids, Tobramycin 0.3%, 5% betadine to ocular surface. A 30 gauge needle was used.   Injection: 2.5 mg bevacizumab 2.5 MG/0.1ML   Route: Intravitreal, Site: Left Eye   NDC: [phone], Lot: [id number]   Post-op Post injection exam found visual acuity of at least counting fingers. The patient tolerated the procedure well. There were no complications. The patient received written and verbal post procedure care education. Post injection medications included ocuflox.      OCT, Retina - OU - Both Eyes       Right Eye Quality was good. Scan locations included subfoveal. Central Foveal Thickness: 313. Progression has been stable. Findings include abnormal foveal contour, retinal drusen .   Left Eye Quality was good. Scan locations included subfoveal. Central Foveal Thickness: 269. Progression has been stable. Findings include retinal drusen , abnormal foveal contour.   Notes OS vastly improved as compared to onset of CNVM with less intraretinal fluid and subretinal fluid in January 2022.  Currently at 9.5-week interval.  Repeat injection intravitreal Avastin OS today and examination next in 10              ASSESSMENT/PLAN:  Exudative age-related macular degeneration of left eye with active choroidal neovascularization (HCC) The nature of wet macular degeneration was discussed with the patient.  Forms of therapy reviewed include the use of Anti-VEGF medications injected painlessly into the eye, as well as other possible treatment modalities, including thermal laser therapy. Fellow eye involvement and risks were discussed with the patient. Upon the finding of wet age related macular degeneration, treatment will be offered. The treatment regimen is on a treat as needed basis with the intent to treat if necessary and extend interval of exams when possible. On average 1 out of 6 patients do not need lifetime therapy. However, the risk of recurrent disease is high for a lifetime.  Initially monthly, then periodic, examinations and evaluations will determine whether the next treatment is required on the day of the examination.  OS with less intraretinal fluid as compared to January 2022, currently at 10-week follow-up interval.  Repeat injection OS today and examination again we will follow-up again, 10 weeks  Cystoid macular edema of left eye As part of wet AMD improving  Advanced nonexudative age-related macular degeneration of both eyes without subfoveal involvement No signs of CNVM OD     ICD-10-CM   1. Exudative age-related macular degeneration of left eye with active choroidal neovascularization (HCC)  H35.3221 Intravitreal Injection, Pharmacologic Agent - OS - Left Eye    OCT, Retina - OU - Both Eyes    bevacizumab (AVASTIN) SOSY 2.5 mg    2. Cystoid macular edema of left eye  H35.352  3. Advanced nonexudative age-related macular degeneration of both eyes without subfoveal involvement  H35.3133       1.  OS vastly improved macular anatomy, stabilized and slightly improved acuity now to level 20/200 with control of CME intraretinal fluid from CNVM.  Repeat injection today and  follow-up again in 10 weeks  2.  U neck  3.  Ophthalmic Meds Ordered this visit:  Meds ordered this encounter  Medications   bevacizumab (AVASTIN) SOSY 2.5 mg       Return in about 10 weeks (around 05/19/2021) for DILATE OU, AVASTIN OCT, OS.  There are no Patient Instructions on file for this visit.   Explained the diagnoses, plan, and follow up with the patient and they expressed understanding.  Patient expressed understanding of the importance of proper follow up care.   Clent Demark Shanquita Ronning M.D. Diseases & Surgery of the Retina and Vitreous Retina & Diabetic [place] 03/10/21     Abbreviations: M myopia (nearsighted); A astigmatism; H hyperopia (farsighted); P presbyopia; Mrx spectacle prescription;  CTL contact lenses; OD right eye; OS left eye; OU both eyes  XT exotropia; ET esotropia; PEK punctate epithelial keratitis; PEE punctate epithelial erosions; DES dry eye syndrome; MGD meibomian gland dysfunction; ATs artificial tears; PFAT's preservative free artificial tears; [place] nuclear sclerotic cataract; PSC posterior subcapsular cataract; ERM epi-retinal membrane; PVD posterior vitreous detachment; RD retinal detachment; DM diabetes mellitus; DR diabetic retinopathy; NPDR non-proliferative diabetic retinopathy; PDR proliferative diabetic retinopathy; CSME clinically significant macular edema; DME diabetic macular edema; dbh dot blot hemorrhages; CWS cotton wool spot; POAG primary open angle glaucoma; C/D cup-to-disc ratio; HVF humphrey visual field; GVF goldmann visual field; OCT optical coherence tomography; IOP intraocular pressure; BRVO Branch retinal vein occlusion; CRVO central retinal vein occlusion; CRAO central retinal artery occlusion; BRAO branch retinal artery occlusion; RT retinal tear; SB scleral buckle; PPV pars plana vitrectomy; VH Vitreous hemorrhage; PRP panretinal laser photocoagulation; IVK intravitreal kenalog; VMT vitreomacular traction; MH Macular hole;  NVD  neovascularization of the disc; NVE neovascularization elsewhere; AREDS age related eye disease study; ARMD age related macular degeneration; POAG primary open angle glaucoma; EBMD epithelial/anterior basement membrane dystrophy; ACIOL anterior chamber intraocular lens; IOL intraocular lens; PCIOL posterior chamber intraocular lens; Phaco/IOL phacoemulsification with intraocular lens placement; [place] photorefractive keratectomy; LASIK laser assisted in situ keratomileusis; HTN hypertension; DM diabetes mellitus; COPD chronic obstructive pulmonary disease

## 2021-03-20 ENCOUNTER — Ambulatory Visit (HOSPITAL_COMMUNITY): Payer: Medicare Other

## 2021-03-20 ENCOUNTER — Ambulatory Visit (HOSPITAL_COMMUNITY): Admission: RE | Admit: 2021-03-20 | Payer: Medicare Other | Source: Ambulatory Visit

## 2021-03-21 DIAGNOSIS — E854 Organ-limited amyloidosis: Secondary | ICD-10-CM | POA: Diagnosis not present

## 2021-03-21 DIAGNOSIS — Z950 Presence of cardiac pacemaker: Secondary | ICD-10-CM | POA: Diagnosis not present

## 2021-03-21 DIAGNOSIS — I110 Hypertensive heart disease with heart failure: Secondary | ICD-10-CM | POA: Diagnosis not present

## 2021-03-21 DIAGNOSIS — I509 Heart failure, unspecified: Secondary | ICD-10-CM | POA: Diagnosis not present

## 2021-03-21 DIAGNOSIS — Z79891 Long term (current) use of opiate analgesic: Secondary | ICD-10-CM | POA: Diagnosis not present

## 2021-03-21 DIAGNOSIS — Z885 Allergy status to narcotic agent status: Secondary | ICD-10-CM | POA: Diagnosis not present

## 2021-03-21 DIAGNOSIS — Z9049 Acquired absence of other specified parts of digestive tract: Secondary | ICD-10-CM | POA: Diagnosis not present

## 2021-03-21 DIAGNOSIS — E079 Disorder of thyroid, unspecified: Secondary | ICD-10-CM | POA: Diagnosis not present

## 2021-03-21 DIAGNOSIS — Z79899 Other long term (current) drug therapy: Secondary | ICD-10-CM | POA: Diagnosis not present

## 2021-03-21 DIAGNOSIS — I43 Cardiomyopathy in diseases classified elsewhere: Secondary | ICD-10-CM | POA: Diagnosis not present

## 2021-03-21 DIAGNOSIS — I4891 Unspecified atrial fibrillation: Secondary | ICD-10-CM | POA: Diagnosis not present

## 2021-03-21 DIAGNOSIS — Z882 Allergy status to sulfonamides status: Secondary | ICD-10-CM | POA: Diagnosis not present

## 2021-03-21 DIAGNOSIS — L97912 Non-pressure chronic ulcer of unspecified part of right lower leg with fat layer exposed: Secondary | ICD-10-CM | POA: Diagnosis not present

## 2021-03-21 DIAGNOSIS — M1990 Unspecified osteoarthritis, unspecified site: Secondary | ICD-10-CM | POA: Diagnosis not present

## 2021-03-21 DIAGNOSIS — Z79890 Hormone replacement therapy (postmenopausal): Secondary | ICD-10-CM | POA: Diagnosis not present

## 2021-03-21 DIAGNOSIS — I878 Other specified disorders of veins: Secondary | ICD-10-CM | POA: Diagnosis not present

## 2021-03-21 DIAGNOSIS — Z7901 Long term (current) use of anticoagulants: Secondary | ICD-10-CM | POA: Diagnosis not present

## 2021-03-21 DIAGNOSIS — L97921 Non-pressure chronic ulcer of unspecified part of left lower leg limited to breakdown of skin: Secondary | ICD-10-CM | POA: Diagnosis not present

## 2021-03-21 DIAGNOSIS — J849 Interstitial pulmonary disease, unspecified: Secondary | ICD-10-CM | POA: Diagnosis not present

## 2021-03-24 ENCOUNTER — Ambulatory Visit (HOSPITAL_COMMUNITY)
Admission: RE | Admit: 2021-03-24 | Discharge: 2021-03-24 | Disposition: A | Discharge status: Home / Self Care | Payer: Medicare Other | Source: Ambulatory Visit | Attending: Neurosurgery | Admitting: Neurosurgery

## 2021-03-24 ENCOUNTER — Other Ambulatory Visit: Payer: Self-pay

## 2021-03-24 DIAGNOSIS — S32030A Wedge compression fracture of third lumbar vertebra, initial encounter for closed fracture: Secondary | ICD-10-CM | POA: Diagnosis not present

## 2021-03-24 DIAGNOSIS — M8588 Other specified disorders of bone density and structure, other site: Secondary | ICD-10-CM | POA: Diagnosis not present

## 2021-03-28 DIAGNOSIS — Z23 Encounter for immunization: Secondary | ICD-10-CM | POA: Diagnosis not present

## 2021-03-28 DIAGNOSIS — L97912 Non-pressure chronic ulcer of unspecified part of right lower leg with fat layer exposed: Secondary | ICD-10-CM | POA: Diagnosis not present

## 2021-03-28 DIAGNOSIS — R609 Edema, unspecified: Secondary | ICD-10-CM | POA: Diagnosis not present

## 2021-03-31 DIAGNOSIS — L97912 Non-pressure chronic ulcer of unspecified part of right lower leg with fat layer exposed: Secondary | ICD-10-CM | POA: Diagnosis not present

## 2021-04-02 DIAGNOSIS — M8448XD Pathological fracture, other site, subsequent encounter for fracture with routine healing: Secondary | ICD-10-CM | POA: Diagnosis not present

## 2021-04-02 DIAGNOSIS — S32030A Wedge compression fracture of third lumbar vertebra, initial encounter for closed fracture: Secondary | ICD-10-CM | POA: Diagnosis not present

## 2021-04-11 DIAGNOSIS — L97912 Non-pressure chronic ulcer of unspecified part of right lower leg with fat layer exposed: Secondary | ICD-10-CM | POA: Diagnosis not present

## 2021-04-11 DIAGNOSIS — I878 Other specified disorders of veins: Secondary | ICD-10-CM | POA: Diagnosis not present

## 2021-04-11 DIAGNOSIS — I509 Heart failure, unspecified: Secondary | ICD-10-CM | POA: Diagnosis not present

## 2021-04-11 DIAGNOSIS — I110 Hypertensive heart disease with heart failure: Secondary | ICD-10-CM | POA: Diagnosis not present

## 2021-04-11 DIAGNOSIS — L97921 Non-pressure chronic ulcer of unspecified part of left lower leg limited to breakdown of skin: Secondary | ICD-10-CM | POA: Diagnosis not present

## 2021-04-11 DIAGNOSIS — E079 Disorder of thyroid, unspecified: Secondary | ICD-10-CM | POA: Diagnosis not present

## 2021-04-12 IMAGING — CT CT CTA ABD/PEL W/CM AND/OR W/O CM
2 of 9 series · 11 of 46 positions shown, 17 images · IV contrast (omnipaque)
Comparison: 07/14/2011

CLINICAL DATA: Recent pulmonary embolism. History of left leg
swelling, negative ultrasound. History of lambda light chain
amyloidosis with cardiac and bone marrow involvement.

EXAM:
CTA ABDOMEN AND PELVIS WITH CONTRAST
TECHNIQUE: Multidetector CT imaging of the abdomen and pelvis was performed
using the venous protocol during bolus administration of intravenous
contrast. Multiplanar reconstructed images and MIPs were obtained
and reviewed to evaluate the vascular anatomy.
CONTRAST:  125mL OMNIPAQUE IOHEXOL 350 MG/ML SOLN

[Series 2: axial portal venous · axial · portal-venous · 0.65mm/px · z∈[-447,-97]mm · 9 of 86 slices shown, 15 images]
[im 8/86  soft-tissue]
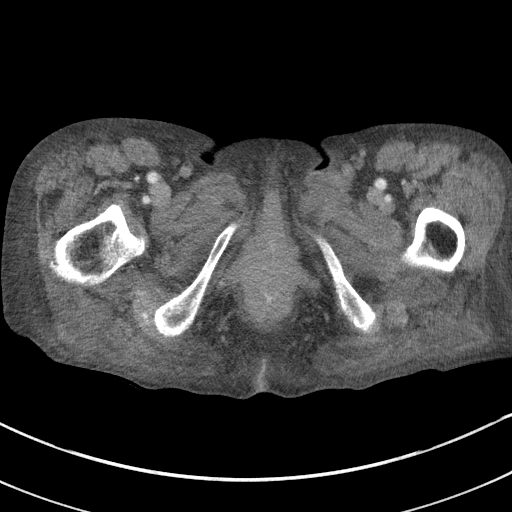
[im 8/86  bone]
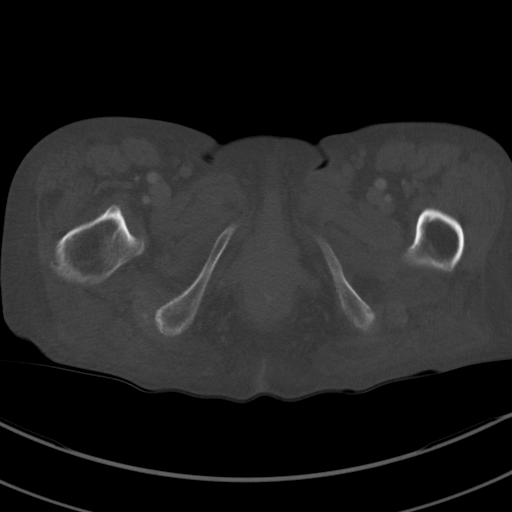
[im 16/86  soft-tissue]
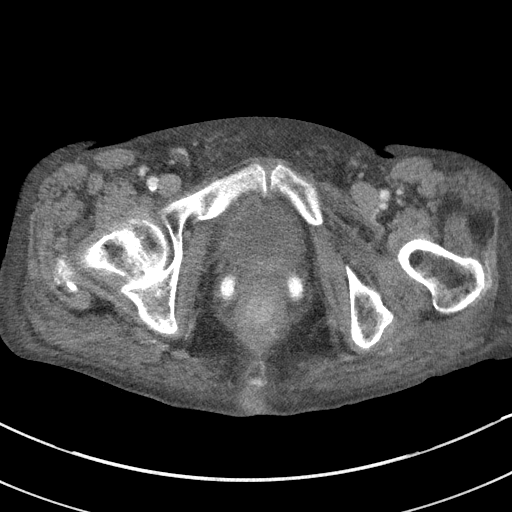
[im 24/86  soft-tissue]
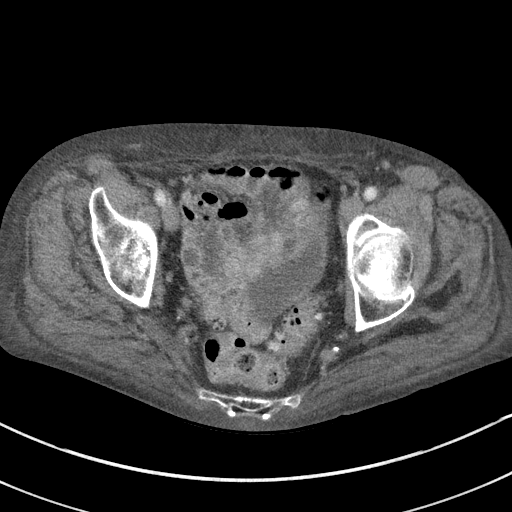
[im 31/86  soft-tissue]
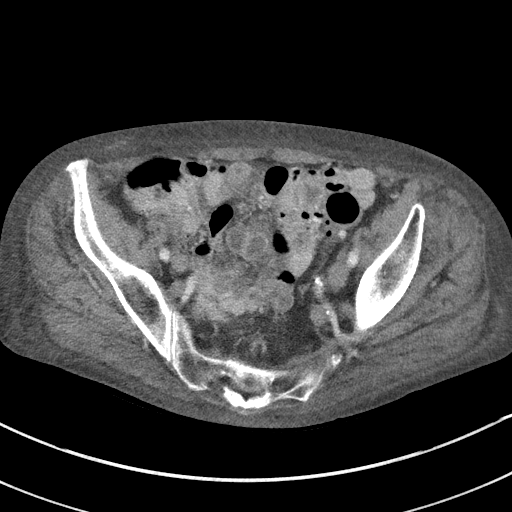
[im 47/86  soft-tissue]
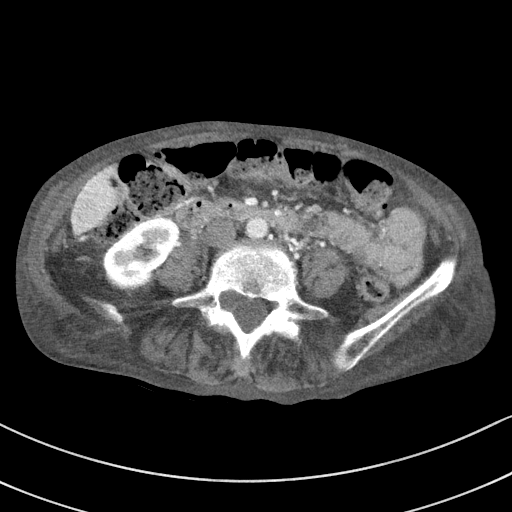
[im 55/86  soft-tissue]
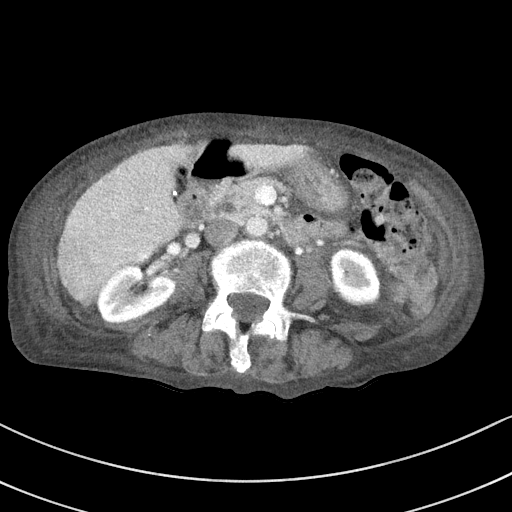
[im 55/86  lung]
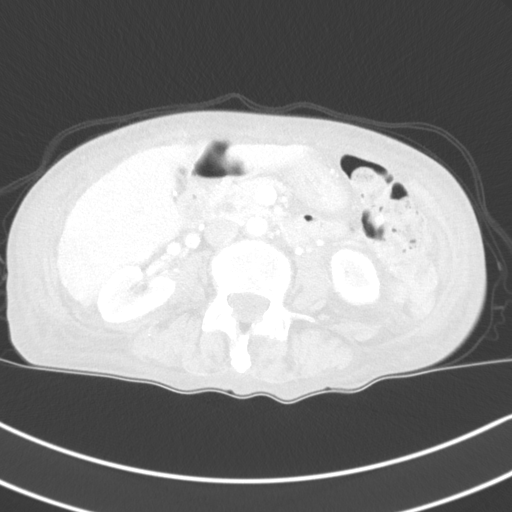
[im 62/86  soft-tissue]
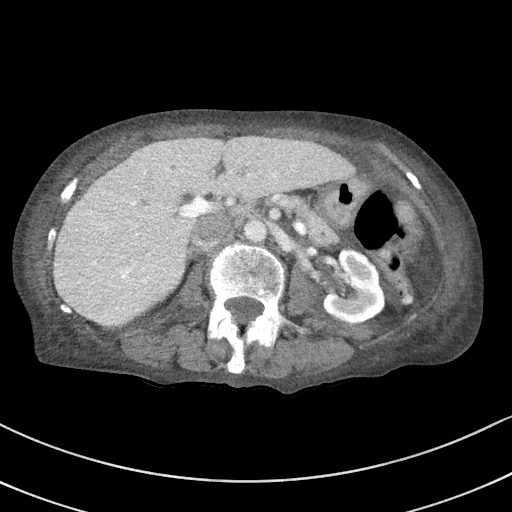
[im 62/86  lung]
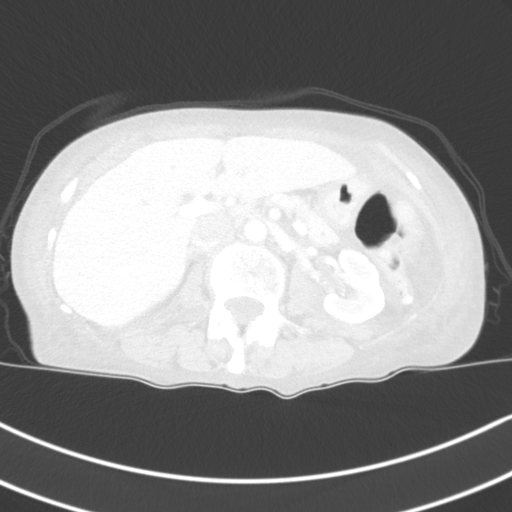
[im 70/86  soft-tissue]
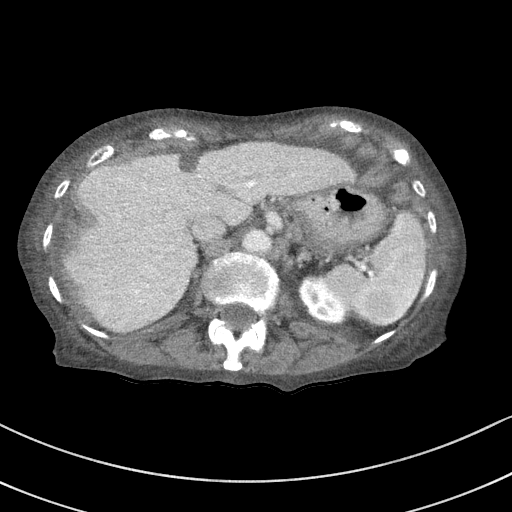
[im 70/86  lung]
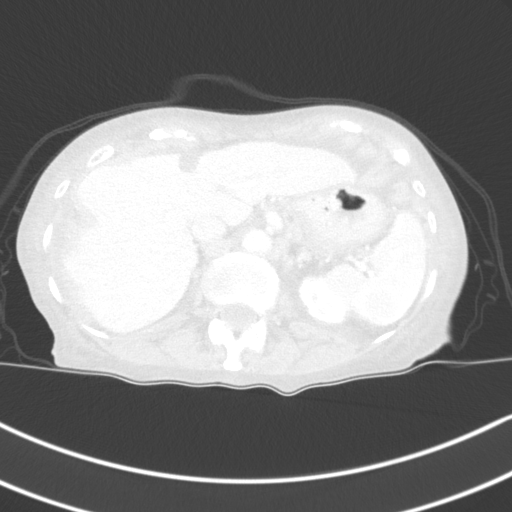
[im 78/86  soft-tissue]
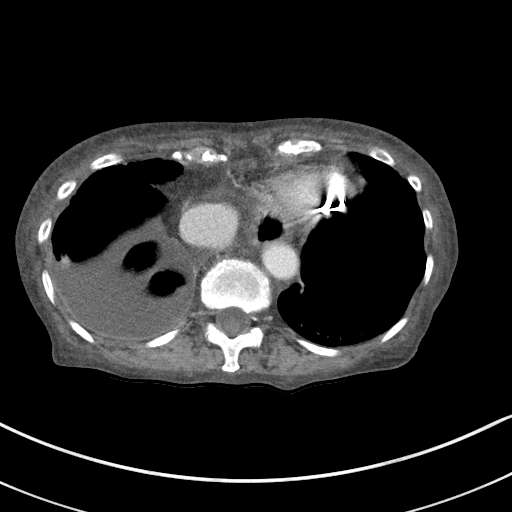
[im 78/86  lung]
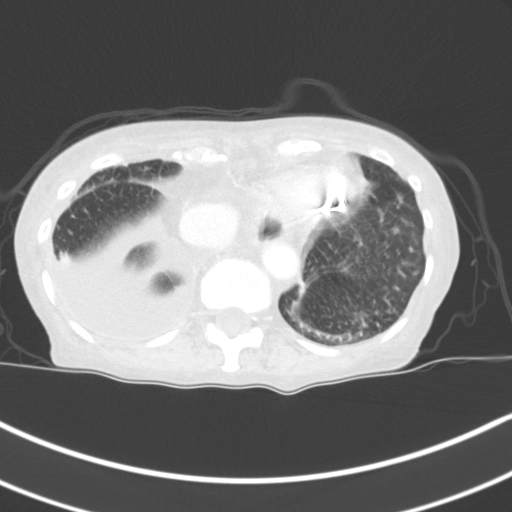
[im 78/86  bone]
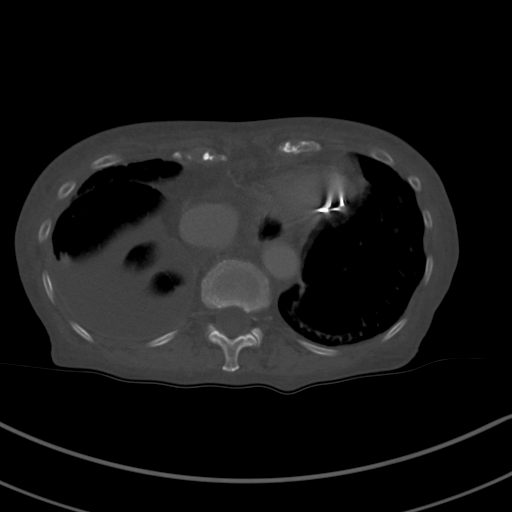

[Series 4: coronals · coronal · 0.72mm/px · 2 of 115 slices shown]
[im 39/115  soft-tissue]
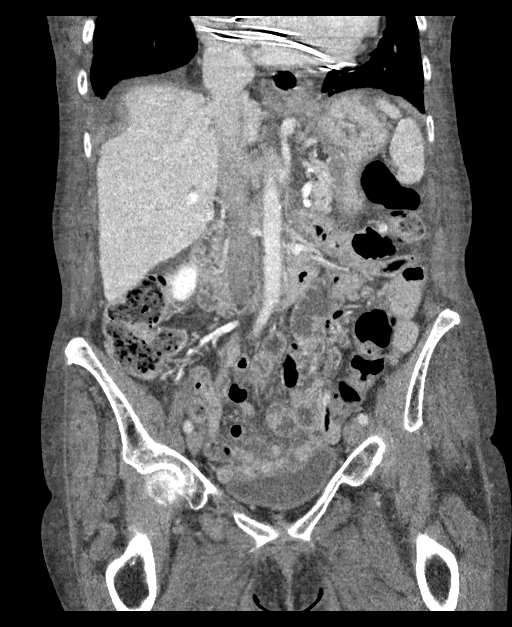
[im 77/115  soft-tissue]
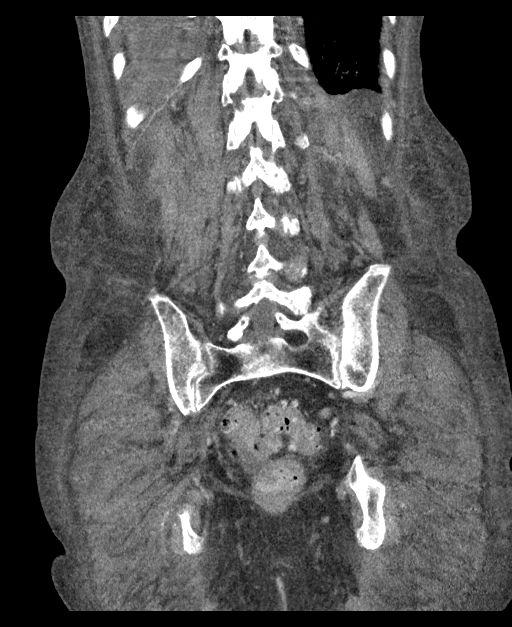

[11 of 46 positions shown; findings below may reference images not displayed]

FINDINGS: VASCULAR

Aorta: Moderate partially calcified atheromatous plaque. No evidence
of aneurysm, dissection, or stenosis.

Celiac: Calcified ostial plaque resulting in short segment stenosis
of at least mild severity, patent distally.

SMA: Calcified ostial plaque resulting in short segment stenosis of
indeterminate severity, patent distally.

Renals: Single right, grossly patent. Single left, with calcified
ostial plaque, no definite high-grade stenosis, evaluation limited
secondary to venous phase timing.

IMA: Patent

Inflow: Mild scattered calcified plaque in the iliac arterial system
without aneurysm or stenosis.

Proximal Outflow: Bilateral common femoral and visualized portions
of the superficial and profunda femoral arteries are patent without
evidence of aneurysm, dissection, vasculitis or significant
stenosis.

Veins: Symmetric enhancement of bilateral common femoral veins
through the iliac venous system to the confluence with the IVC. IVC
is normal in caliber without evidence of thrombus. Bilateral renal
veins patent. Patent hepatic veins, portal vein, SMV, splenic vein.
No venous pathology evident.

Review of the MIP images confirms the above findings.

NON-VASCULAR

Lower chest: New small right pleural effusion. Adjacent
atelectasis/consolidation in the right lung base. Peripheral septal
thickening in the visualized lung bases. Transvenous pacing leads
extend into the right atrium and towards the RV apex.

Hepatobiliary: Stable 1.6 cm probable cyst in segment 4A.
Cholecystectomy clips. Ectatic CBD. No intrahepatic biliary ductal
dilatation.

Pancreas: Unremarkable. No pancreatic ductal dilatation or
surrounding inflammatory changes.

Spleen: Normal in size without focal abnormality.

Adrenals/Urinary Tract: Small low attenuation lesions in the upper
pole left kidney probably cysts but incompletely characterized. No
hydronephrosis. Urinary bladder incompletely distended.

Stomach/Bowel: Small hiatal hernia. The stomach is nondistended. The
small bowel is decompressed. Appendix not identified. Colon is
nondilated with multiple descending and sigmoid diverticula; no
significant adjacent inflammatory/edematous change or abscess.

Lymphatic: No abdominal or pelvic adenopathy.

Reproductive: Status post hysterectomy. No adnexal masses. Pessary
in place.

Other: Left pelvic phleboliths.  No ascites.  No free air.

Musculoskeletal: Mild diffuse body wall edema. Degenerative disc
disease L5-S1. no fracture or worrisome bone lesion.
IMPRESSION: 1. No evidence of IVC or iliac venous thrombus.
2. Origin stenoses of celiac axis and SMA of indeterminate severity.
Correlate with any clinical or laboratory evidence of mesenteric
ischemia.
3. New small right pleural effusion with adjacent
atelectasis/consolidation in the right lung base.
4. Descending and sigmoid diverticulosis.
5. Small hiatal hernia.

Aortic Atherosclerosis ([id number]).

## 2021-04-17 DIAGNOSIS — J9601 Acute respiratory failure with hypoxia: Secondary | ICD-10-CM | POA: Diagnosis not present

## 2021-04-17 DIAGNOSIS — J849 Interstitial pulmonary disease, unspecified: Secondary | ICD-10-CM | POA: Diagnosis not present

## 2021-04-17 DIAGNOSIS — I110 Hypertensive heart disease with heart failure: Secondary | ICD-10-CM | POA: Diagnosis not present

## 2021-04-17 DIAGNOSIS — I2699 Other pulmonary embolism without acute cor pulmonale: Secondary | ICD-10-CM | POA: Diagnosis not present

## 2021-04-17 DIAGNOSIS — K5150 Left sided colitis without complications: Secondary | ICD-10-CM | POA: Diagnosis not present

## 2021-04-17 DIAGNOSIS — M810 Age-related osteoporosis without current pathological fracture: Secondary | ICD-10-CM | POA: Diagnosis not present

## 2021-04-17 DIAGNOSIS — I480 Paroxysmal atrial fibrillation: Secondary | ICD-10-CM | POA: Diagnosis not present

## 2021-04-17 DIAGNOSIS — Z9181 History of falling: Secondary | ICD-10-CM | POA: Diagnosis not present

## 2021-04-17 DIAGNOSIS — H35352 Cystoid macular degeneration, left eye: Secondary | ICD-10-CM | POA: Diagnosis not present

## 2021-04-17 DIAGNOSIS — E859 Amyloidosis, unspecified: Secondary | ICD-10-CM | POA: Diagnosis not present

## 2021-04-17 DIAGNOSIS — I081 Rheumatic disorders of both mitral and tricuspid valves: Secondary | ICD-10-CM | POA: Diagnosis not present

## 2021-04-17 DIAGNOSIS — Z9981 Dependence on supplemental oxygen: Secondary | ICD-10-CM | POA: Diagnosis not present

## 2021-04-17 DIAGNOSIS — I4892 Unspecified atrial flutter: Secondary | ICD-10-CM | POA: Diagnosis not present

## 2021-04-17 DIAGNOSIS — K222 Esophageal obstruction: Secondary | ICD-10-CM | POA: Diagnosis not present

## 2021-04-17 DIAGNOSIS — I5032 Chronic diastolic (congestive) heart failure: Secondary | ICD-10-CM | POA: Diagnosis not present

## 2021-04-17 DIAGNOSIS — S81811D Laceration without foreign body, right lower leg, subsequent encounter: Secondary | ICD-10-CM | POA: Diagnosis not present

## 2021-04-17 DIAGNOSIS — E039 Hypothyroidism, unspecified: Secondary | ICD-10-CM | POA: Diagnosis not present

## 2021-04-17 DIAGNOSIS — K219 Gastro-esophageal reflux disease without esophagitis: Secondary | ICD-10-CM | POA: Diagnosis not present

## 2021-04-17 DIAGNOSIS — K529 Noninfective gastroenteritis and colitis, unspecified: Secondary | ICD-10-CM | POA: Diagnosis not present

## 2021-04-17 DIAGNOSIS — M1812 Unilateral primary osteoarthritis of first carpometacarpal joint, left hand: Secondary | ICD-10-CM | POA: Diagnosis not present

## 2021-04-18 DIAGNOSIS — I10 Essential (primary) hypertension: Secondary | ICD-10-CM | POA: Diagnosis not present

## 2021-04-18 DIAGNOSIS — E782 Mixed hyperlipidemia: Secondary | ICD-10-CM | POA: Diagnosis not present

## 2021-04-23 DIAGNOSIS — H3530 Unspecified macular degeneration: Secondary | ICD-10-CM | POA: Diagnosis not present

## 2021-04-23 DIAGNOSIS — L97921 Non-pressure chronic ulcer of unspecified part of left lower leg limited to breakdown of skin: Secondary | ICD-10-CM | POA: Diagnosis not present

## 2021-04-23 DIAGNOSIS — S32059A Unspecified fracture of fifth lumbar vertebra, initial encounter for closed fracture: Secondary | ICD-10-CM | POA: Diagnosis not present

## 2021-04-23 DIAGNOSIS — I272 Pulmonary hypertension, unspecified: Secondary | ICD-10-CM | POA: Diagnosis not present

## 2021-04-23 DIAGNOSIS — I4891 Unspecified atrial fibrillation: Secondary | ICD-10-CM | POA: Diagnosis not present

## 2021-04-23 DIAGNOSIS — R0902 Hypoxemia: Secondary | ICD-10-CM | POA: Diagnosis not present

## 2021-04-23 DIAGNOSIS — E8581 Light chain (AL) amyloidosis: Secondary | ICD-10-CM | POA: Diagnosis not present

## 2021-04-23 DIAGNOSIS — Z800 Family history of malignant neoplasm of digestive organs: Secondary | ICD-10-CM | POA: Diagnosis not present

## 2021-04-23 DIAGNOSIS — J849 Interstitial pulmonary disease, unspecified: Secondary | ICD-10-CM | POA: Diagnosis not present

## 2021-04-23 DIAGNOSIS — Z882 Allergy status to sulfonamides status: Secondary | ICD-10-CM | POA: Diagnosis not present

## 2021-04-23 DIAGNOSIS — Z885 Allergy status to narcotic agent status: Secondary | ICD-10-CM | POA: Diagnosis not present

## 2021-04-23 DIAGNOSIS — J984 Other disorders of lung: Secondary | ICD-10-CM | POA: Diagnosis not present

## 2021-04-25 ENCOUNTER — Ambulatory Visit (HOSPITAL_COMMUNITY): Admission: RE | Admit: 2021-04-25 | Payer: Medicare Other | Source: Ambulatory Visit

## 2021-04-25 ENCOUNTER — Encounter (HOSPITAL_COMMUNITY): Payer: Self-pay

## 2021-04-25 DIAGNOSIS — I872 Venous insufficiency (chronic) (peripheral): Secondary | ICD-10-CM | POA: Diagnosis not present

## 2021-04-25 DIAGNOSIS — I110 Hypertensive heart disease with heart failure: Secondary | ICD-10-CM | POA: Diagnosis not present

## 2021-04-25 DIAGNOSIS — I509 Heart failure, unspecified: Secondary | ICD-10-CM | POA: Diagnosis not present

## 2021-04-25 DIAGNOSIS — Z79899 Other long term (current) drug therapy: Secondary | ICD-10-CM | POA: Diagnosis not present

## 2021-04-25 DIAGNOSIS — I890 Lymphedema, not elsewhere classified: Secondary | ICD-10-CM | POA: Diagnosis not present

## 2021-04-25 DIAGNOSIS — L97912 Non-pressure chronic ulcer of unspecified part of right lower leg with fat layer exposed: Secondary | ICD-10-CM | POA: Diagnosis not present

## 2021-04-25 DIAGNOSIS — Z7901 Long term (current) use of anticoagulants: Secondary | ICD-10-CM | POA: Diagnosis not present

## 2021-04-25 NOTE — Progress Notes (Signed)
Spoke with patient in regards to MRI scan at 1 pm. Patient will not be able to make appointment today and will have to reschedule at a later date.

## 2021-05-01 DIAGNOSIS — Z950 Presence of cardiac pacemaker: Secondary | ICD-10-CM | POA: Diagnosis not present

## 2021-05-02 DIAGNOSIS — I071 Rheumatic tricuspid insufficiency: Secondary | ICD-10-CM | POA: Diagnosis not present

## 2021-05-02 DIAGNOSIS — I5032 Chronic diastolic (congestive) heart failure: Secondary | ICD-10-CM | POA: Diagnosis not present

## 2021-05-02 DIAGNOSIS — Z950 Presence of cardiac pacemaker: Secondary | ICD-10-CM | POA: Diagnosis not present

## 2021-05-02 DIAGNOSIS — I491 Atrial premature depolarization: Secondary | ICD-10-CM | POA: Diagnosis not present

## 2021-05-02 DIAGNOSIS — E854 Organ-limited amyloidosis: Secondary | ICD-10-CM | POA: Diagnosis not present

## 2021-05-02 DIAGNOSIS — E8581 Light chain (AL) amyloidosis: Secondary | ICD-10-CM | POA: Diagnosis not present

## 2021-05-02 DIAGNOSIS — I43 Cardiomyopathy in diseases classified elsewhere: Secondary | ICD-10-CM | POA: Diagnosis not present

## 2021-05-02 DIAGNOSIS — I340 Nonrheumatic mitral (valve) insufficiency: Secondary | ICD-10-CM | POA: Diagnosis not present

## 2021-05-02 DIAGNOSIS — I480 Paroxysmal atrial fibrillation: Secondary | ICD-10-CM | POA: Diagnosis not present

## 2021-05-03 DIAGNOSIS — I5032 Chronic diastolic (congestive) heart failure: Secondary | ICD-10-CM | POA: Diagnosis not present

## 2021-05-03 DIAGNOSIS — H35352 Cystoid macular degeneration, left eye: Secondary | ICD-10-CM | POA: Diagnosis not present

## 2021-05-03 DIAGNOSIS — J9601 Acute respiratory failure with hypoxia: Secondary | ICD-10-CM | POA: Diagnosis not present

## 2021-05-03 DIAGNOSIS — S81811D Laceration without foreign body, right lower leg, subsequent encounter: Secondary | ICD-10-CM | POA: Diagnosis not present

## 2021-05-03 DIAGNOSIS — I110 Hypertensive heart disease with heart failure: Secondary | ICD-10-CM | POA: Diagnosis not present

## 2021-05-03 DIAGNOSIS — I2699 Other pulmonary embolism without acute cor pulmonale: Secondary | ICD-10-CM | POA: Diagnosis not present

## 2021-05-08 DIAGNOSIS — S81811D Laceration without foreign body, right lower leg, subsequent encounter: Secondary | ICD-10-CM | POA: Diagnosis not present

## 2021-05-08 DIAGNOSIS — J9601 Acute respiratory failure with hypoxia: Secondary | ICD-10-CM | POA: Diagnosis not present

## 2021-05-08 DIAGNOSIS — I2699 Other pulmonary embolism without acute cor pulmonale: Secondary | ICD-10-CM | POA: Diagnosis not present

## 2021-05-08 DIAGNOSIS — I110 Hypertensive heart disease with heart failure: Secondary | ICD-10-CM | POA: Diagnosis not present

## 2021-05-08 DIAGNOSIS — H35352 Cystoid macular degeneration, left eye: Secondary | ICD-10-CM | POA: Diagnosis not present

## 2021-05-08 DIAGNOSIS — I5032 Chronic diastolic (congestive) heart failure: Secondary | ICD-10-CM | POA: Diagnosis not present

## 2021-05-11 DIAGNOSIS — Z20822 Contact with and (suspected) exposure to covid-19: Secondary | ICD-10-CM | POA: Diagnosis not present

## 2021-05-16 DIAGNOSIS — Z79899 Other long term (current) drug therapy: Secondary | ICD-10-CM | POA: Diagnosis not present

## 2021-05-16 DIAGNOSIS — Z7901 Long term (current) use of anticoagulants: Secondary | ICD-10-CM | POA: Diagnosis not present

## 2021-05-16 DIAGNOSIS — I890 Lymphedema, not elsewhere classified: Secondary | ICD-10-CM | POA: Diagnosis not present

## 2021-05-16 DIAGNOSIS — I110 Hypertensive heart disease with heart failure: Secondary | ICD-10-CM | POA: Diagnosis not present

## 2021-05-16 DIAGNOSIS — L97912 Non-pressure chronic ulcer of unspecified part of right lower leg with fat layer exposed: Secondary | ICD-10-CM | POA: Diagnosis not present

## 2021-05-16 DIAGNOSIS — I509 Heart failure, unspecified: Secondary | ICD-10-CM | POA: Diagnosis not present

## 2021-05-16 DIAGNOSIS — I872 Venous insufficiency (chronic) (peripheral): Secondary | ICD-10-CM | POA: Diagnosis not present

## 2021-05-17 DIAGNOSIS — I2699 Other pulmonary embolism without acute cor pulmonale: Secondary | ICD-10-CM | POA: Diagnosis not present

## 2021-05-17 DIAGNOSIS — K219 Gastro-esophageal reflux disease without esophagitis: Secondary | ICD-10-CM | POA: Diagnosis not present

## 2021-05-17 DIAGNOSIS — J849 Interstitial pulmonary disease, unspecified: Secondary | ICD-10-CM | POA: Diagnosis not present

## 2021-05-17 DIAGNOSIS — M1812 Unilateral primary osteoarthritis of first carpometacarpal joint, left hand: Secondary | ICD-10-CM | POA: Diagnosis not present

## 2021-05-17 DIAGNOSIS — S81811D Laceration without foreign body, right lower leg, subsequent encounter: Secondary | ICD-10-CM | POA: Diagnosis not present

## 2021-05-17 DIAGNOSIS — K529 Noninfective gastroenteritis and colitis, unspecified: Secondary | ICD-10-CM | POA: Diagnosis not present

## 2021-05-17 DIAGNOSIS — J9601 Acute respiratory failure with hypoxia: Secondary | ICD-10-CM | POA: Diagnosis not present

## 2021-05-17 DIAGNOSIS — Z9181 History of falling: Secondary | ICD-10-CM | POA: Diagnosis not present

## 2021-05-17 DIAGNOSIS — I4892 Unspecified atrial flutter: Secondary | ICD-10-CM | POA: Diagnosis not present

## 2021-05-17 DIAGNOSIS — I480 Paroxysmal atrial fibrillation: Secondary | ICD-10-CM | POA: Diagnosis not present

## 2021-05-17 DIAGNOSIS — K222 Esophageal obstruction: Secondary | ICD-10-CM | POA: Diagnosis not present

## 2021-05-17 DIAGNOSIS — Z9981 Dependence on supplemental oxygen: Secondary | ICD-10-CM | POA: Diagnosis not present

## 2021-05-17 DIAGNOSIS — E859 Amyloidosis, unspecified: Secondary | ICD-10-CM | POA: Diagnosis not present

## 2021-05-17 DIAGNOSIS — M810 Age-related osteoporosis without current pathological fracture: Secondary | ICD-10-CM | POA: Diagnosis not present

## 2021-05-17 DIAGNOSIS — E039 Hypothyroidism, unspecified: Secondary | ICD-10-CM | POA: Diagnosis not present

## 2021-05-17 DIAGNOSIS — I5032 Chronic diastolic (congestive) heart failure: Secondary | ICD-10-CM | POA: Diagnosis not present

## 2021-05-17 DIAGNOSIS — I110 Hypertensive heart disease with heart failure: Secondary | ICD-10-CM | POA: Diagnosis not present

## 2021-05-17 DIAGNOSIS — I081 Rheumatic disorders of both mitral and tricuspid valves: Secondary | ICD-10-CM | POA: Diagnosis not present

## 2021-05-17 DIAGNOSIS — K5150 Left sided colitis without complications: Secondary | ICD-10-CM | POA: Diagnosis not present

## 2021-05-17 DIAGNOSIS — H35352 Cystoid macular degeneration, left eye: Secondary | ICD-10-CM | POA: Diagnosis not present

## 2021-05-19 ENCOUNTER — Encounter (INDEPENDENT_AMBULATORY_CARE_PROVIDER_SITE_OTHER): Payer: Medicare Other | Admitting: Ophthalmology

## 2021-05-19 ENCOUNTER — Encounter (INDEPENDENT_AMBULATORY_CARE_PROVIDER_SITE_OTHER): Payer: Self-pay | Admitting: Ophthalmology

## 2021-05-19 ENCOUNTER — Ambulatory Visit (INDEPENDENT_AMBULATORY_CARE_PROVIDER_SITE_OTHER): Payer: Medicare Other | Admitting: Ophthalmology

## 2021-05-19 ENCOUNTER — Other Ambulatory Visit: Payer: Self-pay

## 2021-05-19 DIAGNOSIS — H353133 Nonexudative age-related macular degeneration, bilateral, advanced atrophic without subfoveal involvement: Secondary | ICD-10-CM | POA: Diagnosis not present

## 2021-05-19 DIAGNOSIS — H353221 Exudative age-related macular degeneration, left eye, with active choroidal neovascularization: Secondary | ICD-10-CM

## 2021-05-19 MED ORDER — BEVACIZUMAB 2.5 MG/0.1ML IZ SOSY
2.5000 mg | PREFILLED_SYRINGE | INTRAVITREAL | Status: AC | PRN
  Administered 2021-05-19: 2.5 mg via INTRAVITREAL

## 2021-05-19 NOTE — Assessment & Plan Note (Signed)
No sign of CNVM OD today

## 2021-05-19 NOTE — Progress Notes (Signed)
05/19/2021     CHIEF COMPLAINT Patient presents for  Chief Complaint  Patient presents with   Retina Follow Up      HISTORY OF PRESENT ILLNESS: Cindy [name] is a 88 y.o. female who presents to the clinic today for:   HPI     Retina Follow Up           Diagnosis: Wet AMD   Laterality: left eye   Onset: 10 weeks ago   Severity: mild   Duration: 10 weeks   Course: stable         Comments   10 week fu OU. Dilate OU, OCT and Avastin OS  Pt states VA OU stable since last visit. Pt denies FOL, floaters, or ocular pain OU.  Pt states, "I am still having so much trouble with my up close vision."        Last edited by Kendra Opitz, COA on 05/19/2021 10:07 AM.      Referring physician: Celene Squibb, MD [place],  [place] [id number]  HISTORICAL INFORMATION:   Selected notes from the MEDICAL RECORD NUMBER    Lab Results  Component Value Date   HGBA1C 6.1 (H) 03/14/2013     CURRENT MEDICATIONS: Current Outpatient Medications (Ophthalmic Drugs)  Medication Sig   Polyethyl Glycol-Propyl Glycol (SYSTANE OP) Apply 1 drop to eye daily.   No current facility-administered medications for this visit. (Ophthalmic Drugs)   Current Outpatient Medications (Other)  Medication Sig   acetaminophen (TYLENOL) 500 MG tablet Take 1,000 mg by mouth as needed.   albuterol (PROVENTIL) (2.5 MG/3ML) 0.083% nebulizer solution Take 3 mLs by nebulization 4 (four) times daily as needed.   ALPRAZolam (XANAX) 0.5 MG tablet Take 1 tablet (0.5 mg total) by mouth at bedtime as needed for anxiety.   calcium carbonate (TUMS - DOSED IN MG ELEMENTAL CALCIUM) 500 MG chewable tablet Chew 1 tablet by mouth daily.   esomeprazole (NEXIUM) 40 MG capsule Take 1 capsule by mouth every evening.   fentaNYL (DURAGESIC) 25 MCG/HR Place 1 patch onto the skin every 3 (three) days.   levothyroxine (SYNTHROID) 112 MCG tablet Take 112 mcg by mouth daily.   metoprolol succinate  (TOPROL-XL) 25 MG 24 hr tablet Take 0.5-1 tablets (12.5-25 mg total) by mouth in the morning and at bedtime. Patient takes 1 tablet (30m) in the morning and 1/2 of a tablet (12.536m in the evening   Multiple Vitamins-Minerals (VISION VITAMINS) TABS Take 1 tablet by mouth daily.   nitrofurantoin, macrocrystal-monohydrate, (MACROBID) 100 MG capsule Take 100 mg by mouth 2 (two) times daily.   ondansetron (ZOFRAN) 4 MG tablet Take 4 mg by mouth every 8 (eight) hours as needed.   potassium chloride SA (KLOR-CON) 20 MEQ tablet Take 20 mEq by mouth daily.   senna (SENOKOT) 8.6 MG tablet Take 2 tablets by mouth as needed for constipation.   sodium chloride (OCEAN) 0.65 % nasal spray Place 2 sprays into the nose daily as needed.    torsemide (DEMADEX) 20 MG tablet Take 20 mg by mouth daily.   warfarin (COUMADIN) 2.5 MG tablet Take 1 tablet (2.5 mg total) by mouth one time only at 4 PM.   No current facility-administered medications for this visit. (Other)      REVIEW OF SYSTEMS:    ALLERGIES Allergies  Allergen Reactions   Nebivolol Hcl     Other reaction(s): Other (See Comments) Chest tightness,sob,cough,severe tiredness,confusion   Codeine Nausea Only  Sulfa Antibiotics Nausea Only   Sulfonamide Derivatives Nausea Only    PAST MEDICAL HISTORY Past Medical History:  Diagnosis Date   Cancer ([place])    CHF (congestive heart failure) (HCC)    Amyloidosis   Diarrhea    Diverticulosis of colon (without mention of hemorrhage)    Edema    Esophagitis, unspecified    Family history of malignant neoplasm of gastrointestinal tract    Fibrosis of lung ([place]) 03/14/2013   Flatulence, eructation, and gas pain    GERD (gastroesophageal reflux disease)    Left sided ulcerative colitis ([place])    Maxillary sinus mass    Osteoporosis, unspecified    Other and unspecified hyperlipidemia    Other and unspecified noninfectious gastroenteritis and colitis(558.9)    Paroxysmal atrial fibrillation  ([place]) 07/09/2019   Stricture and stenosis of esophagus    Unspecified hypothyroidism    URI (upper respiratory infection)    Past Surgical History:  Procedure Laterality Date   ABDOMINAL HYSTERECTOMY     CATARACT EXTRACTION W/PHACO Bilateral    Dr. Gershon Crane   CHOLECYSTECTOMY     FOOT SURGERY     THYROIDECTOMY, PARTIAL     YAG LASER APPLICATION Right 9/62/8366   Procedure: YAG LASER APPLICATION;  Surgeon: Elta Guadeloupe T. Gershon Crane, MD;  Location: [facility];  Service: Ophthalmology;  Laterality: Right;    FAMILY HISTORY Family History  Problem Relation Age of Onset   Colon cancer Mother    Heart attack Father        CHF   Stomach cancer Sister    Diabetes Son    Kidney cancer Son    Adrenal disorder Son    Heart disease Brother     SOCIAL HISTORY Social History   Tobacco Use   Smoking status: Never   Smokeless tobacco: Never  Vaping Use   Vaping Use: Never used  Substance Use Topics   Alcohol use: No    Alcohol/week: 0.0 standard drinks   Drug use: No         OPHTHALMIC EXAM:  Base Eye Exam     Visual Acuity (ETDRS)       Right Left   Dist [place] 20/40 -1 CF at 3'   Dist ph [place] NI 20/150 -1         Tonometry (Tonopen, 10:11 AM)       Right Left   Pressure 12 11         Pupils       Pupils Dark Light Shape React APD   Right PERRL 3 3 Round Minimal None   Left PERRL 3 3 Round Minimal None         Visual Fields (Counting fingers)       Left Right    Full Full         Extraocular Movement       Right Left    Full, Ortho Full, Ortho         Neuro/Psych     Oriented x3: Yes   Mood/Affect: Normal         Dilation     Both eyes: 1.0% Mydriacyl, 2.5% Phenylephrine @ 10:10 AM           Slit Lamp and Fundus Exam     External Exam       Right Left   External Normal Normal         Slit Lamp Exam       Right Left   Lids/Lashes  Normal Normal   Conjunctiva/Sclera White and quiet White and quiet   Cornea Clear Clear   Anterior  Chamber Deep and quiet Deep and quiet   Iris Round and reactive Round and reactive   Lens  Posterior chamber intraocular lens   Anterior Vitreous Normal Normal         Fundus Exam       Right Left   Posterior Vitreous Posterior vitreous detachment ,,, Posterior vitreous detachment   Disc Normal Normal   C/D Ratio 0.2 0.1   Macula Hard drusen, Pigmented atrophy Atrophy, Age related macular degeneration, Early age related macular degeneration, Drusen, Macular thickening, dry atrophic change in the macula   Vessels Normal Normal   Periphery Normal Normal            IMAGING AND PROCEDURES  Imaging and Procedures for 05/19/21  OCT, Retina - OU - Both Eyes       Right Eye Quality was good. Scan locations included subfoveal. Central Foveal Thickness: 317. Progression has been stable. Findings include abnormal foveal contour, retinal drusen .   Left Eye Quality was good. Scan locations included subfoveal. Central Foveal Thickness: 262. Progression has been stable. Findings include retinal drusen , abnormal foveal contour.   Notes OS vastly improved as compared to onset of CNVM with less intraretinal fluid and subretinal fluid in January 2022.  Currently at 10-week interval.  Repeat injection intravitreal Avastin OS today and examination next in 12     Intravitreal Injection, Pharmacologic Agent - OS - Left Eye       Time Out 05/19/2021. 10:48 AM. Confirmed correct patient, procedure, site, and patient consented.   Anesthesia Topical anesthesia was used. Anesthetic medications included Lidocaine 4%.   Procedure Preparation included 10% betadine to eyelids, Tobramycin 0.3%, 5% betadine to ocular surface. A 30 gauge needle was used.   Injection: 2.5 mg bevacizumab 2.5 MG/0.1ML   Route: Intravitreal, Site: Left Eye   NDC: [phone], Lot: [id number]   Post-op Post injection exam found visual acuity of at least counting fingers. The patient tolerated the procedure well.  There were no complications. The patient received written and verbal post procedure care education. Post injection medications included ocuflox.              ASSESSMENT/PLAN:  Exudative age-related macular degeneration of left eye with active choroidal neovascularization ([place]) As compared to onset January 2022, vastly improved macular anatomy  yet with surprisingly poor acuity,, today at 10-week follow-up interval  Advanced nonexudative age-related macular degeneration of both eyes without subfoveal involvement No sign of CNVM OD today     ICD-10-CM   1. Exudative age-related macular degeneration of left eye with active choroidal neovascularization (HCC)  H35.3221 OCT, Retina - OU - Both Eyes    Intravitreal Injection, Pharmacologic Agent - OS - Left Eye    bevacizumab (AVASTIN) SOSY 2.5 mg    2. Advanced nonexudative age-related macular degeneration of both eyes without subfoveal involvement  H35.3133       1.  OS, vastly improved macular anatomy stable acuity.  On 10-week interval now post Avastin for wet AMD subfoveal onset January 2022  2.  OD no sign of complication  3.  Dilate OU next and evaluate in 3 months  Ophthalmic Meds Ordered this visit:  Meds ordered this encounter  Medications   bevacizumab (AVASTIN) SOSY 2.5 mg       Return in about 12 weeks (around 08/11/2021) for dilate, OS, AVASTIN OCT.  There are no  Patient Instructions on file for this visit.   Explained the diagnoses, plan, and follow up with the patient and they expressed understanding.  Patient expressed understanding of the importance of proper follow up care.   Clent Demark Ellee Wawrzyniak M.D. Diseases & Surgery of the Retina and Vitreous Retina & Diabetic [place] 05/19/21     Abbreviations: M myopia (nearsighted); A astigmatism; H hyperopia (farsighted); P presbyopia; Mrx spectacle prescription;  CTL contact lenses; OD right eye; OS left eye; OU both eyes  XT exotropia; ET esotropia; PEK  punctate epithelial keratitis; PEE punctate epithelial erosions; DES dry eye syndrome; MGD meibomian gland dysfunction; ATs artificial tears; PFAT's preservative free artificial tears; [place] nuclear sclerotic cataract; PSC posterior subcapsular cataract; ERM epi-retinal membrane; PVD posterior vitreous detachment; RD retinal detachment; DM diabetes mellitus; DR diabetic retinopathy; NPDR non-proliferative diabetic retinopathy; PDR proliferative diabetic retinopathy; CSME clinically significant macular edema; DME diabetic macular edema; dbh dot blot hemorrhages; CWS cotton wool spot; POAG primary open angle glaucoma; C/D cup-to-disc ratio; HVF humphrey visual field; GVF goldmann visual field; OCT optical coherence tomography; IOP intraocular pressure; BRVO Branch retinal vein occlusion; CRVO central retinal vein occlusion; CRAO central retinal artery occlusion; BRAO branch retinal artery occlusion; RT retinal tear; SB scleral buckle; PPV pars plana vitrectomy; VH Vitreous hemorrhage; PRP panretinal laser photocoagulation; IVK intravitreal kenalog; VMT vitreomacular traction; MH Macular hole;  NVD neovascularization of the disc; NVE neovascularization elsewhere; AREDS age related eye disease study; ARMD age related macular degeneration; POAG primary open angle glaucoma; EBMD epithelial/anterior basement membrane dystrophy; ACIOL anterior chamber intraocular lens; IOL intraocular lens; PCIOL posterior chamber intraocular lens; Phaco/IOL phacoemulsification with intraocular lens placement; [place] photorefractive keratectomy; LASIK laser assisted in situ keratomileusis; HTN hypertension; DM diabetes mellitus; COPD chronic obstructive pulmonary disease

## 2021-05-19 NOTE — Assessment & Plan Note (Addendum)
As compared to onset January 2022, vastly improved macular anatomy  yet with surprisingly poor acuity,, today at 10-week follow-up interval

## 2021-05-20 DIAGNOSIS — E782 Mixed hyperlipidemia: Secondary | ICD-10-CM | POA: Diagnosis not present

## 2021-05-20 DIAGNOSIS — I10 Essential (primary) hypertension: Secondary | ICD-10-CM | POA: Diagnosis not present

## 2021-05-22 DIAGNOSIS — J9601 Acute respiratory failure with hypoxia: Secondary | ICD-10-CM | POA: Diagnosis not present

## 2021-05-22 DIAGNOSIS — I2699 Other pulmonary embolism without acute cor pulmonale: Secondary | ICD-10-CM | POA: Diagnosis not present

## 2021-05-22 DIAGNOSIS — I110 Hypertensive heart disease with heart failure: Secondary | ICD-10-CM | POA: Diagnosis not present

## 2021-05-22 DIAGNOSIS — H35352 Cystoid macular degeneration, left eye: Secondary | ICD-10-CM | POA: Diagnosis not present

## 2021-05-22 DIAGNOSIS — S81811D Laceration without foreign body, right lower leg, subsequent encounter: Secondary | ICD-10-CM | POA: Diagnosis not present

## 2021-05-22 DIAGNOSIS — H6123 Impacted cerumen, bilateral: Secondary | ICD-10-CM | POA: Diagnosis not present

## 2021-05-22 DIAGNOSIS — I5032 Chronic diastolic (congestive) heart failure: Secondary | ICD-10-CM | POA: Diagnosis not present

## 2021-05-26 DIAGNOSIS — H35352 Cystoid macular degeneration, left eye: Secondary | ICD-10-CM | POA: Diagnosis not present

## 2021-05-26 DIAGNOSIS — J9601 Acute respiratory failure with hypoxia: Secondary | ICD-10-CM | POA: Diagnosis not present

## 2021-05-26 DIAGNOSIS — S81811D Laceration without foreign body, right lower leg, subsequent encounter: Secondary | ICD-10-CM | POA: Diagnosis not present

## 2021-05-26 DIAGNOSIS — I2699 Other pulmonary embolism without acute cor pulmonale: Secondary | ICD-10-CM | POA: Diagnosis not present

## 2021-05-26 DIAGNOSIS — I5032 Chronic diastolic (congestive) heart failure: Secondary | ICD-10-CM | POA: Diagnosis not present

## 2021-05-26 DIAGNOSIS — I110 Hypertensive heart disease with heart failure: Secondary | ICD-10-CM | POA: Diagnosis not present

## 2021-05-27 DIAGNOSIS — I890 Lymphedema, not elsewhere classified: Secondary | ICD-10-CM | POA: Diagnosis not present

## 2021-06-02 DIAGNOSIS — I890 Lymphedema, not elsewhere classified: Secondary | ICD-10-CM | POA: Diagnosis not present

## 2021-06-04 DIAGNOSIS — I890 Lymphedema, not elsewhere classified: Secondary | ICD-10-CM | POA: Diagnosis not present

## 2021-06-06 DIAGNOSIS — E039 Hypothyroidism, unspecified: Secondary | ICD-10-CM | POA: Diagnosis not present

## 2021-06-06 DIAGNOSIS — R7303 Prediabetes: Secondary | ICD-10-CM | POA: Diagnosis not present

## 2021-06-09 DIAGNOSIS — I890 Lymphedema, not elsewhere classified: Secondary | ICD-10-CM | POA: Diagnosis not present

## 2021-06-10 DIAGNOSIS — J9601 Acute respiratory failure with hypoxia: Secondary | ICD-10-CM | POA: Diagnosis not present

## 2021-06-10 DIAGNOSIS — T148XXD Other injury of unspecified body region, subsequent encounter: Secondary | ICD-10-CM | POA: Diagnosis not present

## 2021-06-10 DIAGNOSIS — R7303 Prediabetes: Secondary | ICD-10-CM | POA: Diagnosis not present

## 2021-06-10 DIAGNOSIS — E039 Hypothyroidism, unspecified: Secondary | ICD-10-CM | POA: Diagnosis not present

## 2021-06-10 DIAGNOSIS — I509 Heart failure, unspecified: Secondary | ICD-10-CM | POA: Diagnosis not present

## 2021-06-10 DIAGNOSIS — G894 Chronic pain syndrome: Secondary | ICD-10-CM | POA: Diagnosis not present

## 2021-06-10 DIAGNOSIS — E782 Mixed hyperlipidemia: Secondary | ICD-10-CM | POA: Diagnosis not present

## 2021-06-10 DIAGNOSIS — R748 Abnormal levels of other serum enzymes: Secondary | ICD-10-CM | POA: Diagnosis not present

## 2021-06-10 DIAGNOSIS — I4891 Unspecified atrial fibrillation: Secondary | ICD-10-CM | POA: Diagnosis not present

## 2021-06-10 DIAGNOSIS — E859 Amyloidosis, unspecified: Secondary | ICD-10-CM | POA: Diagnosis not present

## 2021-06-12 DIAGNOSIS — I890 Lymphedema, not elsewhere classified: Secondary | ICD-10-CM | POA: Diagnosis not present

## 2021-06-19 ENCOUNTER — Observation Stay (HOSPITAL_COMMUNITY)
Admission: EM | Admit: 2021-06-19 | Discharge: 2021-06-20 | Disposition: A | Discharge status: Home Health | Payer: Medicare Other | Attending: Internal Medicine | Admitting: Internal Medicine

## 2021-06-19 ENCOUNTER — Observation Stay (HOSPITAL_COMMUNITY): Payer: Medicare Other

## 2021-06-19 ENCOUNTER — Encounter (HOSPITAL_COMMUNITY): Payer: Self-pay

## 2021-06-19 ENCOUNTER — Emergency Department (HOSPITAL_COMMUNITY): Payer: Medicare Other

## 2021-06-19 ENCOUNTER — Other Ambulatory Visit: Payer: Self-pay

## 2021-06-19 DIAGNOSIS — I480 Paroxysmal atrial fibrillation: Secondary | ICD-10-CM | POA: Diagnosis not present

## 2021-06-19 DIAGNOSIS — R531 Weakness: Secondary | ICD-10-CM | POA: Diagnosis not present

## 2021-06-19 DIAGNOSIS — I5033 Acute on chronic diastolic (congestive) heart failure: Secondary | ICD-10-CM | POA: Diagnosis not present

## 2021-06-19 DIAGNOSIS — Z7901 Long term (current) use of anticoagulants: Secondary | ICD-10-CM | POA: Insufficient documentation

## 2021-06-19 DIAGNOSIS — R0600 Dyspnea, unspecified: Secondary | ICD-10-CM

## 2021-06-19 DIAGNOSIS — Z79899 Other long term (current) drug therapy: Secondary | ICD-10-CM | POA: Diagnosis not present

## 2021-06-19 DIAGNOSIS — Z20822 Contact with and (suspected) exposure to covid-19: Secondary | ICD-10-CM | POA: Insufficient documentation

## 2021-06-19 DIAGNOSIS — J441 Chronic obstructive pulmonary disease with (acute) exacerbation: Secondary | ICD-10-CM | POA: Diagnosis not present

## 2021-06-19 DIAGNOSIS — J9621 Acute and chronic respiratory failure with hypoxia: Principal | ICD-10-CM | POA: Diagnosis present

## 2021-06-19 DIAGNOSIS — K219 Gastro-esophageal reflux disease without esophagitis: Secondary | ICD-10-CM | POA: Diagnosis present

## 2021-06-19 DIAGNOSIS — R0602 Shortness of breath: Secondary | ICD-10-CM | POA: Diagnosis not present

## 2021-06-19 DIAGNOSIS — E039 Hypothyroidism, unspecified: Secondary | ICD-10-CM | POA: Diagnosis present

## 2021-06-19 DIAGNOSIS — R059 Cough, unspecified: Secondary | ICD-10-CM | POA: Diagnosis not present

## 2021-06-19 DIAGNOSIS — J90 Pleural effusion, not elsewhere classified: Secondary | ICD-10-CM | POA: Diagnosis not present

## 2021-06-19 LAB — CBC
HCT: 41.4 % (ref 36.0–46.0)
Hemoglobin: 13.5 g/dL (ref 12.0–15.0)
MCH: 32.8 pg (ref 26.0–34.0)
MCHC: 32.6 g/dL (ref 30.0–36.0)
MCV: 100.5 fL — ABNORMAL HIGH (ref 80.0–100.0)
Platelets: 178 10*3/uL (ref 150–400)
RBC: 4.12 MIL/uL (ref 3.87–5.11)
RDW: 14 % (ref 11.5–15.5)
WBC: 7.6 10*3/uL (ref 4.0–10.5)
nRBC: 0 % (ref 0.0–0.2)

## 2021-06-19 LAB — BASIC METABOLIC PANEL
Anion gap: 8 (ref 5–15)
BUN: 19 mg/dL (ref 8–23)
CO2: 26 mmol/L (ref 22–32)
Calcium: 9 mg/dL (ref 8.9–10.3)
Chloride: 102 mmol/L (ref 98–111)
Creatinine, Ser: 0.65 mg/dL (ref 0.44–1.00)
GFR, Estimated: >60 mL/min (ref >60)
Glucose, Bld: 99 mg/dL (ref 70–99)
Potassium: 4.1 mmol/L (ref 3.5–5.1)
Sodium: 136 mmol/L (ref 135–145)

## 2021-06-19 LAB — RESP PANEL BY RT-PCR (FLU A&B, COVID) ARPGX2
Influenza A by PCR: NEGATIVE
Influenza B by PCR: NEGATIVE
SARS Coronavirus 2 by RT PCR: NEGATIVE

## 2021-06-19 LAB — TROPONIN I (HIGH SENSITIVITY)
Troponin I (High Sensitivity): 8 ng/L (ref <18)
Troponin I (High Sensitivity): 8 ng/L (ref <18)

## 2021-06-19 LAB — BRAIN NATRIURETIC PEPTIDE: B Natriuretic Peptide: 185 pg/mL — ABNORMAL HIGH (ref 0.0–100.0)

## 2021-06-19 LAB — PROCALCITONIN: Procalcitonin: <0.1 ng/mL

## 2021-06-19 MED ORDER — ALBUTEROL SULFATE (2.5 MG/3ML) 0.083% IN NEBU
2.5000 mg | INHALATION_SOLUTION | Freq: Once | RESPIRATORY_TRACT | Status: AC
  Administered 2021-06-19: 2.5 mg via RESPIRATORY_TRACT
  Filled 2021-06-19: qty 3

## 2021-06-19 MED ORDER — AZITHROMYCIN 500 MG IV SOLR
500.0000 mg | Freq: Once | INTRAVENOUS | Status: AC
  Administered 2021-06-19: 500 mg via INTRAVENOUS
  Filled 2021-06-19: qty 5

## 2021-06-19 MED ORDER — METHYLPREDNISOLONE SODIUM SUCC 125 MG IJ SOLR
125.0000 mg | Freq: Once | INTRAMUSCULAR | Status: AC
  Administered 2021-06-19: 125 mg via INTRAVENOUS
  Filled 2021-06-19: qty 2

## 2021-06-19 MED ORDER — FUROSEMIDE 10 MG/ML IJ SOLN
40.0000 mg | Freq: Two times a day (BID) | INTRAMUSCULAR | Status: DC
  Administered 2021-06-19 – 2021-06-20 (×3): 40 mg via INTRAVENOUS
  Filled 2021-06-19 (×3): qty 4

## 2021-06-19 MED ORDER — ACETAMINOPHEN 325 MG PO TABS
650.0000 mg | ORAL_TABLET | Freq: Four times a day (QID) | ORAL | Status: DC | PRN
  Administered 2021-06-20: 650 mg via ORAL
  Filled 2021-06-19: qty 2

## 2021-06-19 MED ORDER — OXYCODONE HCL 5 MG PO TABS: 5.0000 mg | ORAL_TABLET | ORAL | Status: DC | PRN

## 2021-06-19 MED ORDER — POLYETHYLENE GLYCOL 3350 17 G PO PACK: 17.0000 g | PACK | Freq: Every day | ORAL | Status: DC | PRN

## 2021-06-19 MED ORDER — ALPRAZOLAM 0.5 MG PO TABS
0.5000 mg | ORAL_TABLET | Freq: Once | ORAL | Status: AC
  Administered 2021-06-20: 0.25 mg via ORAL
  Filled 2021-06-19: qty 1

## 2021-06-19 MED ORDER — ACETAMINOPHEN 650 MG RE SUPP: 650.0000 mg | Freq: Four times a day (QID) | RECTAL | Status: DC | PRN

## 2021-06-19 MED ORDER — ONDANSETRON HCL 4 MG PO TABS: 4.0000 mg | ORAL_TABLET | Freq: Four times a day (QID) | ORAL | Status: DC | PRN

## 2021-06-19 MED ORDER — ONDANSETRON HCL 4 MG/2ML IJ SOLN
4.0000 mg | Freq: Four times a day (QID) | INTRAMUSCULAR | Status: DC | PRN
Start: 2021-06-19 — End: 2021-06-20

## 2021-06-19 MED ORDER — IPRATROPIUM-ALBUTEROL 0.5-2.5 (3) MG/3ML IN SOLN
3.0000 mL | Freq: Once | RESPIRATORY_TRACT | Status: AC
Start: 2021-06-19 — End: 2021-06-19
  Administered 2021-06-19: 3 mL via RESPIRATORY_TRACT
  Filled 2021-06-19: qty 3

## 2021-06-19 MED ORDER — SODIUM CHLORIDE 0.9 % IV SOLN
2.0000 g | Freq: Once | INTRAVENOUS | Status: AC
  Administered 2021-06-19: 2 g via INTRAVENOUS
  Filled 2021-06-19: qty 20

## 2021-06-19 NOTE — ED Provider Notes (Signed)
[facility] EMERGENCY DEPARTMENT Provider Note   CSN: [id number] Arrival date & time: 06/19/21  1708     History  Chief Complaint  Patient presents with   Shortness of Breath    Cindy [name] is a 88 y.o. female.  Patient complains of cough yellow sputum production and shortness of breath.  Patient has a history of heart failure and fibrosis and uses oxygen occasionally  The history is provided by the patient and medical records. No language interpreter was used.  Shortness of Breath Severity:  Moderate Onset quality:  Sudden Timing:  Constant Progression:  Worsening Chronicity:  Recurrent Context: activity   Relieved by:  Nothing Worsened by:  Nothing Ineffective treatments:  None tried Associated symptoms: no abdominal pain, no chest pain, no cough, no headaches and no rash       Home Medications Prior to Admission medications   Medication Sig Start Date End Date Taking? Authorizing Provider  acetaminophen (TYLENOL) 500 MG tablet Take 1,000 mg by mouth as needed.   Yes [provider]  albuterol (PROVENTIL) (2.5 MG/3ML) 0.083% nebulizer solution Take 3 mLs by nebulization 4 (four) times daily as needed. 10/18/19  Yes [provider]  ALPRAZolam Duanne Moron) 0.5 MG tablet Take 1 tablet (0.5 mg total) by mouth at bedtime as needed for anxiety. 03/15/13  Yes Robbie Lis, MD  apixaban (ELIQUIS) 5 MG TABS tablet Take 5 mg by mouth 2 (two) times daily.   Yes [provider]  calcium carbonate (TUMS - DOSED IN MG ELEMENTAL CALCIUM) 500 MG chewable tablet Chew 1 tablet by mouth daily.   Yes [provider]  fentaNYL (DURAGESIC) 25 MCG/HR Place 1 patch onto the skin every 3 (three) days. 06/15/19  Yes [provider]  levothyroxine (SYNTHROID) 112 MCG tablet Take 112 mcg by mouth daily. 06/13/20  Yes [provider]  metoprolol succinate (TOPROL-XL) 25 MG 24 hr tablet Take 0.5-1 tablets (12.5-25 mg total) by mouth in the  morning and at bedtime. Patient takes 1 tablet (57m) in the morning and 1/2 of a tablet (12.57m in the evening Patient taking differently: Take 25 mg by mouth daily. 07/10/19  Yes MaBarton DuboisMD  Multiple Vitamins-Minerals (PRESERVISION AREDS 2 PO) Take 1 tablet by mouth daily.   Yes [provider]  ondansetron (ZOFRAN) 4 MG tablet Take 4 mg by mouth every 8 (eight) hours as needed. 08/03/19  Yes [provider]  Polyethyl Glycol-Propyl Glycol (SYSTANE OP) Apply 1 drop to eye daily.   Yes [provider]  potassium chloride SA (KLOR-CON) 20 MEQ tablet Take 10 mEq by mouth See admin instructions. Take 10 meq 4 times a week 05/22/20  Yes [provider]  senna (SENOKOT) 8.6 MG tablet Take 2 tablets by mouth as needed for constipation.   Yes [provider]  sodium chloride (OCEAN) 0.65 % nasal spray Place 2 sprays into the nose daily as needed.    Yes [provider]  torsemide (DEMADEX) 20 MG tablet Take 20 mg by mouth daily. 03/28/19  Yes [provider]  diclofenac Sodium (VOLTAREN) 1 % GEL Apply topically.    [provider]  esomeprazole (NEXIUM) 40 MG capsule Take 1 capsule by mouth every evening. Patient not taking: Reported on 06/19/2021 07/30/20   [provider]  Multiple Vitamins-Minerals (VISION VITAMINS) TABS Take 1 tablet by mouth daily. Patient not taking: Reported on 06/19/2021    [provider]  nitrofurantoin, macrocrystal-monohydrate, (MACROBID) 100 MG capsule Take  100 mg by mouth 2 (two) times daily. Patient not taking: Reported on 06/19/2021 08/14/20   [provider]  warfarin (COUMADIN) 2.5 MG tablet Take 1 tablet (2.5 mg total) by mouth one time only at 4 PM. Patient not taking: Reported on 06/19/2021 08/19/20 11/17/20  Deatra James, MD      Allergies    Nebivolol hcl, Codeine, Sulfa antibiotics, and Sulfonamide derivatives    Review of Systems   Review of Systems   Constitutional:  Negative for appetite change and fatigue.  HENT:  Negative for congestion, ear discharge and sinus pressure.   Eyes:  Negative for discharge.  Respiratory:  Positive for shortness of breath. Negative for cough.   Cardiovascular:  Negative for chest pain.  Gastrointestinal:  Negative for abdominal pain and diarrhea.  Genitourinary:  Negative for frequency and hematuria.  Musculoskeletal:  Negative for back pain.  Skin:  Negative for rash.  Neurological:  Negative for seizures and headaches.  Psychiatric/Behavioral:  Negative for hallucinations.    Physical Exam Updated Vital Signs BP 128/68    Pulse 89    Temp 98.2 F (36.8 C) (Oral)    Resp 18    Ht 5' 8"  (1.727 m)    Wt 54.9 kg    SpO2 97%    BMI 18.40 kg/m  Physical Exam Vitals and nursing note reviewed.  Constitutional:      Appearance: She is well-developed.  HENT:     Head: Normocephalic.     Nose: Nose normal.  Eyes:     General: No scleral icterus.    Conjunctiva/sclera: Conjunctivae normal.  Neck:     Thyroid: No thyromegaly.  Cardiovascular:     Rate and Rhythm: Normal rate and regular rhythm.     Heart sounds: No murmur heard.   No friction rub. No gallop.  Pulmonary:     Breath sounds: No stridor. Wheezing present. No rales.  Chest:     Chest wall: No tenderness.  Abdominal:     General: There is no distension.     Tenderness: There is no abdominal tenderness. There is no rebound.  Musculoskeletal:        General: Normal range of motion.     Cervical back: Neck supple.  Lymphadenopathy:     Cervical: No cervical adenopathy.  Skin:    Findings: No erythema or rash.  Neurological:     Mental Status: She is alert and oriented to person, place, and time.     Motor: No abnormal muscle tone.     Coordination: Coordination normal.  Psychiatric:        Behavior: Behavior normal.    ED Results / Procedures / Treatments   Labs (all labs ordered are listed, but only abnormal results are  displayed) Labs Reviewed  CBC - Abnormal; Notable for the following components:      Result Value   MCV 100.5 (*)    All other components within normal limits  BRAIN NATRIURETIC PEPTIDE - Abnormal; Notable for the following components:   B Natriuretic Peptide 185.0 (*)    All other components within normal limits  RESP PANEL BY RT-PCR (FLU A&B, COVID) ARPGX2  BASIC METABOLIC PANEL  PROCALCITONIN  PROCALCITONIN  TROPONIN I (HIGH SENSITIVITY)  TROPONIN I (HIGH SENSITIVITY)    EKG EKG Interpretation  Date/Time:  Thursday June 19 2021 17:34:45 EST Ventricular Rate:  88 PR Interval:  246 QRS Duration: 88 QT Interval:  378 QTC Calculation: 457 R Axis:   -65  Text Interpretation: Atrial-paced rhythm with prolonged AV conduction Left axis deviation Minimal voltage criteria for LVH, may be normal variant ( Cornell product ) Inferior infarct (cited on or before 09-Jan-2021) Anterolateral infarct (cited on or before 09-Jan-2021) Abnormal ECG When compared with ECG of 09-Jan-2021 19:11, Electronic atrial pacemaker has replaced Sinus rhythm Confirmed by Milton Ferguson [phone]) on 06/19/2021 9:38:19 PM  Radiology DG Chest 2 View  Result Date: 06/19/2021 CLINICAL DATA:  Short of breath, productive cough, weakness EXAM: CHEST - 2 VIEW COMPARISON:  01/09/2021 FINDINGS: Frontal and lateral views of the chest demonstrates stable dual lead pacer. The cardiac silhouette is unremarkable. Chronic fibrotic changes are again noted, without superimposed airspace disease. There is a small right pleural effusion. No pneumothorax. IMPRESSION: 1. Trace right pleural effusion. 2. Chronic parenchymal scarring and fibrosis. No acute airspace disease. Electronically Signed   By: Randa Ngo M.D.   On: 06/19/2021 18:02    Procedures Procedures    Medications Ordered in ED Medications  methylPREDNISolone sodium succinate (SOLU-MEDROL) 125 mg/2 mL injection 125 mg (has no administration in time range)   cefTRIAXone (ROCEPHIN) 2 g in sodium chloride 0.9 % 100 mL IVPB (has no administration in time range)  azithromycin (ZITHROMAX) 500 mg in sodium chloride 0.9 % 250 mL IVPB (has no administration in time range)  ipratropium-albuterol (DUONEB) 0.5-2.5 (3) MG/3ML nebulizer solution 3 mL (3 mLs Nebulization Given 06/19/21 2057)  albuterol (PROVENTIL) (2.5 MG/3ML) 0.083% nebulizer solution 2.5 mg (2.5 mg Nebulization Given 06/19/21 2057)    ED Course/ Medical Decision Making/ A&P                           Medical Decision Making Amount and/or Complexity of Data Reviewed Labs: ordered. Radiology: ordered.  Risk Prescription drug management. Decision regarding hospitalization.  This patient presents to the ED for concern of shortness of breath, this involves an extensive number of treatment options, and is a complaint that carries with it a high risk of complications and morbidity.  The differential diagnosis includes worsening COPD, MI   Co morbidities that complicate the patient evaluation  COPD   Additional history obtained:  Additional history obtained from patient External records from outside source obtained and reviewed including hospital record   Lab Tests:  I Ordered, and personally interpreted labs.  The pertinent results include: CBC and chemistry unremarkable   Imaging Studies ordered:  I ordered imaging studies including chest x-ray I independently visualized and interpreted imaging which showed fibrosis which is chronic I agree with the radiologist interpretation   Cardiac Monitoring:  The patient was maintained on a cardiac monitor.  I personally viewed and interpreted the cardiac monitored which showed an underlying rhythm of: Sinus tach   Medicines ordered and prescription drug management:  I ordered medication including albuterol and Atrovent Reevaluation of the patient after these medicines showed that the patient improved I have reviewed the patients  home medicines and have made adjustments as needed   Test Considered:  CT chest none   Critical Interventions:  None   Consultations Obtained:  I requested consultation with the hospitalist,  and discussed lab and imaging findings as well as pertinent plan - they recommend: Admit and treat COPD exacerbation   Problem List / ED Course:  COPD    Social Determinants of Health:  None      Patient's O2 sat was 83% when she ambulated Patient with hypoxia and upper respiratory infection with hypoxia.  She will be treated with steroids and neb treatments and covered for possible bacterial infection.        Final Clinical Impression(s) / ED Diagnoses Final diagnoses:  COPD exacerbation [facility])    Rx / DC Orders ED Discharge Orders     None         Milton Ferguson, MD 06/21/21 [phone]

## 2021-06-19 NOTE — ED Notes (Signed)
Pt ambulated down the hall, spo2 dropping to 86% on ra. HR 110. C/o sob while walking. Spo2 down to 82% when back to room. Placed on 2l [place]. Sats improved to 92% with rest and supplemental o2. ?

## 2021-06-19 NOTE — ED Triage Notes (Signed)
Reports increased sob and weakness wants to make sure it is not her heart ?

## 2021-06-19 NOTE — Progress Notes (Signed)
Assessed patient.  Patient BS Rhonchi and Expiratory Wheeze.  Patient denies any allergies, COPD, Asthma.   ?

## 2021-06-20 ENCOUNTER — Observation Stay (HOSPITAL_BASED_OUTPATIENT_CLINIC_OR_DEPARTMENT_OTHER): Payer: Medicare Other

## 2021-06-20 ENCOUNTER — Observation Stay (HOSPITAL_COMMUNITY): Payer: Medicare Other

## 2021-06-20 DIAGNOSIS — K219 Gastro-esophageal reflux disease without esophagitis: Secondary | ICD-10-CM | POA: Diagnosis not present

## 2021-06-20 DIAGNOSIS — E039 Hypothyroidism, unspecified: Secondary | ICD-10-CM

## 2021-06-20 DIAGNOSIS — I480 Paroxysmal atrial fibrillation: Secondary | ICD-10-CM | POA: Diagnosis not present

## 2021-06-20 DIAGNOSIS — I5033 Acute on chronic diastolic (congestive) heart failure: Secondary | ICD-10-CM | POA: Diagnosis not present

## 2021-06-20 DIAGNOSIS — J9690 Respiratory failure, unspecified, unspecified whether with hypoxia or hypercapnia: Secondary | ICD-10-CM | POA: Diagnosis not present

## 2021-06-20 DIAGNOSIS — R0602 Shortness of breath: Secondary | ICD-10-CM | POA: Diagnosis not present

## 2021-06-20 DIAGNOSIS — J441 Chronic obstructive pulmonary disease with (acute) exacerbation: Secondary | ICD-10-CM | POA: Diagnosis not present

## 2021-06-20 DIAGNOSIS — J9621 Acute and chronic respiratory failure with hypoxia: Secondary | ICD-10-CM | POA: Diagnosis not present

## 2021-06-20 DIAGNOSIS — I517 Cardiomegaly: Secondary | ICD-10-CM | POA: Diagnosis not present

## 2021-06-20 LAB — COMPREHENSIVE METABOLIC PANEL
ALT: 10 U/L (ref 0–44)
AST: 23 U/L (ref 15–41)
Albumin: 3.4 g/dL — ABNORMAL LOW (ref 3.5–5.0)
Alkaline Phosphatase: 88 U/L (ref 38–126)
Anion gap: 9 (ref 5–15)
BUN: 18 mg/dL (ref 8–23)
CO2: 24 mmol/L (ref 22–32)
Calcium: 8.8 mg/dL — ABNORMAL LOW (ref 8.9–10.3)
Chloride: 101 mmol/L (ref 98–111)
Creatinine, Ser: 0.68 mg/dL (ref 0.44–1.00)
GFR, Estimated: >60 mL/min (ref >60)
Glucose, Bld: 179 mg/dL — ABNORMAL HIGH (ref 70–99)
Potassium: 3.8 mmol/L (ref 3.5–5.1)
Sodium: 134 mmol/L — ABNORMAL LOW (ref 135–145)
Total Bilirubin: 0.8 mg/dL (ref 0.3–1.2)
Total Protein: 6.7 g/dL (ref 6.5–8.1)

## 2021-06-20 LAB — CBC WITH DIFFERENTIAL/PLATELET
Abs Immature Granulocytes: 0.03 10*3/uL (ref 0.00–0.07)
Basophils Absolute: 0 10*3/uL (ref 0.0–0.1)
Basophils Relative: 0 %
Eosinophils Absolute: 0 10*3/uL (ref 0.0–0.5)
Eosinophils Relative: 0 %
HCT: 37.2 % (ref 36.0–46.0)
Hemoglobin: 12.4 g/dL (ref 12.0–15.0)
Immature Granulocytes: 1 %
Lymphocytes Relative: 8 %
Lymphs Abs: 0.4 10*3/uL — ABNORMAL LOW (ref 0.7–4.0)
MCH: 32.8 pg (ref 26.0–34.0)
MCHC: 33.3 g/dL (ref 30.0–36.0)
MCV: 98.4 fL (ref 80.0–100.0)
Monocytes Absolute: 0 10*3/uL — ABNORMAL LOW (ref 0.1–1.0)
Monocytes Relative: 1 %
Neutro Abs: 4.9 10*3/uL (ref 1.7–7.7)
Neutrophils Relative %: 90 %
Platelets: 159 10*3/uL (ref 150–400)
RBC: 3.78 MIL/uL — ABNORMAL LOW (ref 3.87–5.11)
RDW: 14.2 % (ref 11.5–15.5)
WBC: 5.4 10*3/uL (ref 4.0–10.5)
nRBC: 0 % (ref 0.0–0.2)

## 2021-06-20 LAB — PROCALCITONIN: Procalcitonin: <0.1 ng/mL

## 2021-06-20 LAB — ECHOCARDIOGRAM COMPLETE
AR max vel: 1.67 cm2
AV Area VTI: 1.61 cm2
AV Area mean vel: 1.75 cm2
AV Mean grad: 2 mmHg
AV Peak grad: 3.6 mmHg
Ao pk vel: 0.95 m/s
Height: 68 in
S' Lateral: 2.3 cm
Weight: 1996.49 oz

## 2021-06-20 LAB — MAGNESIUM: Magnesium: 1.8 mg/dL (ref 1.7–2.4)

## 2021-06-20 MED ORDER — SALINE SPRAY 0.65 % NA SOLN: 2.0000 | Freq: Every day | NASAL | Status: DC | PRN

## 2021-06-20 MED ORDER — LEVOTHYROXINE SODIUM 112 MCG PO TABS
112.0000 ug | ORAL_TABLET | Freq: Every day | ORAL | Status: DC
  Administered 2021-06-20: 112 ug via ORAL
  Filled 2021-06-20: qty 1

## 2021-06-20 MED ORDER — ALBUTEROL SULFATE (2.5 MG/3ML) 0.083% IN NEBU: 2.5000 mg | INHALATION_SOLUTION | RESPIRATORY_TRACT | Status: DC | PRN

## 2021-06-20 MED ORDER — PANTOPRAZOLE SODIUM 40 MG PO TBEC
40.0000 mg | DELAYED_RELEASE_TABLET | Freq: Every day | ORAL | Status: DC
  Administered 2021-06-20: 40 mg via ORAL
  Filled 2021-06-20: qty 1

## 2021-06-20 MED ORDER — ALPRAZOLAM 0.5 MG PO TABS: 0.5000 mg | ORAL_TABLET | Freq: Every evening | ORAL | Status: DC | PRN

## 2021-06-20 MED ORDER — SENNA 8.6 MG PO TABS: 2.0000 | ORAL_TABLET | Freq: Two times a day (BID) | ORAL | Status: DC | PRN

## 2021-06-20 MED ORDER — METOPROLOL SUCCINATE ER 25 MG PO TB24
25.0000 mg | ORAL_TABLET | Freq: Every day | ORAL | Status: DC
  Administered 2021-06-20: 25 mg via ORAL
  Filled 2021-06-20: qty 1

## 2021-06-20 MED ORDER — ESOMEPRAZOLE MAGNESIUM 40 MG PO CPDR: 40.0000 mg | DELAYED_RELEASE_CAPSULE | Freq: Every evening | ORAL | 1 refills | Status: DC

## 2021-06-20 MED ORDER — IPRATROPIUM-ALBUTEROL 0.5-2.5 (3) MG/3ML IN SOLN
3.0000 mL | RESPIRATORY_TRACT | 3 refills | Status: DC | PRN
Start: 2021-06-20 — End: 2023-09-02

## 2021-06-20 MED ORDER — APIXABAN 5 MG PO TABS
5.0000 mg | ORAL_TABLET | Freq: Two times a day (BID) | ORAL | Status: DC
  Administered 2021-06-20 (×2): 5 mg via ORAL
  Filled 2021-06-20 (×2): qty 1

## 2021-06-20 MED ORDER — PREDNISONE 10 MG PO TABS
ORAL_TABLET | ORAL | 0 refills | Status: DC
Start: 2021-06-20 — End: 2021-10-13

## 2021-06-20 NOTE — H&P (Signed)
History and Physical    Patient: Cindy [name] HER:[id number] DOB: 06/11/1932 DOA: 06/19/2021 DOS: the patient was seen and examined on 06/20/2021 PCP: Celene Squibb, MD  Patient coming from: Home  Chief Complaint:  Chief Complaint  Patient presents with   Shortness of Breath    HPI: Cindy [name] is a 88 y.o. female  with history of CHF, cancer,, diverticulosis, fibrosis of the lung, GERD, ulcerative colitis, osteoporosis, hyperlipidemia, paroxysmal atrial fibrillation, blood clots, and more presents to ED with a chief complaint of dyspnea.  Patient reports that at 830 this morning she was taking a pill when she suddenly developed a cough.  She reports she was coughing so hard she could not catch her breath, and the cough is productive of yellow sputum.  She reports the sputum was thick and she choked on it.  She could hear rattling and wheezing in her chest.  This went on after lunchtime, and she decided to come into the ER.  She does have as needed oxygen at home which she uses if her oxygen sats drop below 90.  She cannot remember if she had to use it today or not.  Patient does have a history of blood clots per her report.  She reports last year she had DVTs.  She has been taking her Eliquis regularly.  Patient does have lymphedema of the bilateral lower extremities and gets weekly massages and wraps for her legs.  Patient denies unilateral leg swelling, any long car rides/plane ride/train ride/trips, hormone use, recent surgery, and hemoptysis.  Patient also denies fever and diaphoresis.  She does report that she has been weak and tired and feeling short of breath for months.  There is no change to that.  She is not following with a pulmonologist at this time.  She reports she was seeing a pulmonologist at [facility] with their transportation barriers for that specialist.  Patient reports she has never had a diagnosis of COPD but she has had experience secondhand smoke regularly from her  husband and at her old job.  Patient denies any chest pain, palpitations.  She is not coughing at all during the exam.  She reports she was worried that this cough had something to do with her CHF.  She has no sick contacts.  Patient has no other complaints.  Patient does not smoke, does not drink alcohol, does not use illicit drugs.  She is vaccinated for COVID +1 booster, and she is full code.    Review of Systems: As mentioned in the history of present illness. All other systems reviewed and are negative. Past Medical History:  Diagnosis Date   Cancer ([place])    CHF (congestive heart failure) ([place])    Amyloidosis   Diarrhea    Diverticulosis of colon (without mention of hemorrhage)    Edema    Esophagitis, unspecified    Family history of malignant neoplasm of gastrointestinal tract    Fibrosis of lung ([place]) 03/14/2013   Flatulence, eructation, and gas pain    GERD (gastroesophageal reflux disease)    Left sided ulcerative colitis ([place])    Maxillary sinus mass    Osteoporosis, unspecified    Other and unspecified hyperlipidemia    Other and unspecified noninfectious gastroenteritis and colitis(558.9)    Paroxysmal atrial fibrillation ([place]) 07/09/2019   Stricture and stenosis of esophagus    Unspecified hypothyroidism    URI (upper respiratory infection)    Past Surgical History:  Procedure Laterality Date  ABDOMINAL HYSTERECTOMY     CATARACT EXTRACTION W/PHACO Bilateral    Dr. Gershon Crane   CHOLECYSTECTOMY     FOOT SURGERY     THYROIDECTOMY, PARTIAL     YAG LASER APPLICATION Right 2/42/6834   Procedure: YAG LASER APPLICATION;  Surgeon: Elta Guadeloupe T. Gershon Crane, MD;  Location: [facility];  Service: Ophthalmology;  Laterality: Right;   Social History:  reports that she has never smoked. She has never used smokeless tobacco. She reports that she does not drink alcohol and does not use drugs.  Allergies  Allergen Reactions   Nebivolol Hcl     Other reaction(s): Other (See Comments) Chest  tightness,sob,cough,severe tiredness,confusion   Codeine Nausea Only   Sulfa Antibiotics Nausea Only   Sulfonamide Derivatives Nausea Only    Family History  Problem Relation Age of Onset   Colon cancer Mother    Heart attack Father        CHF   Stomach cancer Sister    Diabetes Son    Kidney cancer Son    Adrenal disorder Son    Heart disease Brother     Prior to Admission medications   Medication Sig Start Date End Date Taking? Authorizing Provider  acetaminophen (TYLENOL) 500 MG tablet Take 1,000 mg by mouth as needed.   Yes [provider]  albuterol (PROVENTIL) (2.5 MG/3ML) 0.083% nebulizer solution Take 3 mLs by nebulization 4 (four) times daily as needed. 10/18/19  Yes [provider]  ALPRAZolam Duanne Moron) 0.5 MG tablet Take 1 tablet (0.5 mg total) by mouth at bedtime as needed for anxiety. 03/15/13  Yes Robbie Lis, MD  apixaban (ELIQUIS) 5 MG TABS tablet Take 5 mg by mouth 2 (two) times daily.   Yes [provider]  calcium carbonate (TUMS - DOSED IN MG ELEMENTAL CALCIUM) 500 MG chewable tablet Chew 1 tablet by mouth daily.   Yes [provider]  fentaNYL (DURAGESIC) 25 MCG/HR Place 1 patch onto the skin every 3 (three) days. 06/15/19  Yes [provider]  levothyroxine (SYNTHROID) 112 MCG tablet Take 112 mcg by mouth daily. 06/13/20  Yes [provider]  metoprolol succinate (TOPROL-XL) 25 MG 24 hr tablet Take 0.5-1 tablets (12.5-25 mg total) by mouth in the morning and at bedtime. Patient takes 1 tablet (42m) in the morning and 1/2 of a tablet (12.564m in the evening Patient taking differently: Take 25 mg by mouth daily. 07/10/19  Yes MaBarton DuboisMD  Multiple Vitamins-Minerals (PRESERVISION AREDS 2 PO) Take 1 tablet by mouth daily.   Yes [provider]  ondansetron (ZOFRAN) 4 MG tablet Take 4 mg by mouth every 8 (eight) hours as needed. 08/03/19  Yes [provider]  Polyethyl Glycol-Propyl Glycol  (SYSTANE OP) Apply 1 drop to eye daily.   Yes [provider]  potassium chloride SA (KLOR-CON) 20 MEQ tablet Take 10 mEq by mouth See admin instructions. Take 10 meq 4 times a week 05/22/20  Yes [provider]  senna (SENOKOT) 8.6 MG tablet Take 2 tablets by mouth as needed for constipation.   Yes [provider]  sodium chloride (OCEAN) 0.65 % nasal spray Place 2 sprays into the nose daily as needed.    Yes [provider]  torsemide (DEMADEX) 20 MG tablet Take 20 mg by mouth daily. 03/28/19  Yes [provider]  diclofenac Sodium (VOLTAREN) 1 % GEL Apply topically.    [provider]  esomeprazole (NEXIUM) 40 MG capsule Take 1 capsule by mouth every  evening. Patient not taking: Reported on 06/19/2021 07/30/20   [provider]  Multiple Vitamins-Minerals (VISION VITAMINS) TABS Take 1 tablet by mouth daily. Patient not taking: Reported on 06/19/2021    [provider]  nitrofurantoin, macrocrystal-monohydrate, (MACROBID) 100 MG capsule Take 100 mg by mouth 2 (two) times daily. Patient not taking: Reported on 06/19/2021 08/14/20   [provider]  warfarin (COUMADIN) 2.5 MG tablet Take 1 tablet (2.5 mg total) by mouth one time only at 4 PM. Patient not taking: Reported on 06/19/2021 08/19/20 11/17/20  Deatra James, MD    Physical Exam: Vitals:   06/19/21 2100 06/19/21 2130 06/20/21 0100 06/20/21 0129  BP:  128/68 124/76 119/72  Pulse:  89 75 89  Resp:  18 20 18   Temp:    97.9 F (36.6 C)  TempSrc:    Oral  SpO2: 93% 97% 94% 92%  Weight:    56.8 kg  Height:    5' 8"  (1.727 m)   1.  General: Patient lying supine in bed,  no acute distress   2. Psychiatric: Alert and oriented x 3, mood and behavior normal for situation, pleasant and cooperative with exam   3. Neurologic: Speech and language are normal, face is symmetric, moves all 4 extremities voluntarily, at baseline without acute deficits on limited exam    4. HEENMT:  Head is atraumatic, normocephalic, pupils reactive to light, neck is supple, trachea is midline, mucous membranes are moist   5. Respiratory : Lungs bilateral coarse crackles worse on right than left, no wheeze, no rhonchi, no cyanosis, no increased work of breathing, 2 L nasal cannula in place   6. Cardiovascular : Heart rate normal, rhythm is regular, no murmurs, rubs or gallops, legs wrapped, peripheral pulses palpated   7. Gastrointestinal:  Abdomen is soft, nondistended, nontender to palpation bowel sounds active, no masses or organomegaly palpated   8. Skin:  Skin is warm, dry and intact without rashes, acute lesions, or ulcers on limited exam   9.Musculoskeletal:  No acute deformities or trauma, no asymmetry in tone, legs wrapped, peripheral pulses palpated, no tenderness to palpation in the extremities   Data Reviewed: In the ED Temp 98.2, heart rate 75, respiratory 17-20, blood pressure 108/64-128/68, satting 91 to 97% at rest, dropping down to 86% with ambulation No leukocytosis White blood cell count of 7.6, hemoglobin 13.5 Chemistry panel unremarkable BNP 185 Troponin 8, 8 Chest x-ray shows trace right pleural effusion, chronic parenchymal scarring and fibrosis, no acute airspace disease EKG shows heart rate 88, atrial paced Zithromax, Rocephin, DuoNeb, albuterol, Solu-Medrol started in the ED  Assessment and Plan: * Acute on chronic respiratory failure with hypoxia (HCC) Oxygen saturations dropping down to 86% History of CHF, elevated BNP, chest x-ray is not showing overt pulmonary edema, but does show slight pleural effusion Patient takes torsemide at home, will start Lasix 40 mg IV here Patient with history of blood clot, but reports compliance with Eliquis.  Wells score 1.5 -unlikely to be PE Troponin stable at 8, 8 No leukocytosis, fever, or pneumonia finding on chest x-ray Procalcitonin less than 0.1 Discontinue antibiotics Patient does have  history of pulmonary fibrosis and is likely hypoxic more often than she knows.  She already has as needed oxygen at home.  Will monitor overnight, repeat chest x-ray in the a.m. to assess for any changes, and likely will be able to be discharged home with recommendation to use oxygen with exertion  Paroxysmal atrial fibrillation ([place]) Continue  Eliquis and metoprolol  GERD (gastroesophageal reflux disease) Continue Protonix  Hypothyroidism Continue Synthroid       Ad[facility] Care Planning:   Code Status: Full Code   Consults: None  Family Communication: No family at bedside  Severity of Illness: The appropriate patient status for this patient is OBSERVATION. Observation status is judged to be reasonable and necessary in order to provide the required intensity of service to ensure the patient's safety. The patient's presenting symptoms, physical exam findings, and initial radiographic and laboratory data in the context of their medical condition is felt to place them at decreased risk for further clinical deterioration. Furthermore, it is anticipated that the patient will be medically stable for discharge from the hospital within 2 midnights of admission.   Author: Rolla Plate, DO 06/20/2021 2:04 AM  For on call review [email or web address].

## 2021-06-20 NOTE — Assessment & Plan Note (Addendum)
-  Oxygen saturations dropping down to 86% on room air ?-Excellent response to supplementation with 2 L of nasal cannula oxygen and treatment with nebulizer management and Lasix. ?-Acute respiratory exacerbation appears to be a combination of pulmonary fibrosis flare along with mild CHF exacerbation. ?-Patient will complete steroid tapering, follow daily weights, strict I's and O's and resume diuretics at discharge. ?-Outpatient follow-up with pulmonology service ?-Prescription for nebulizer machine and as needed DuoNeb provided at discharge. ?-Patient has been encouraged to use oxygen 2 L on regular basis. ? ? ? ?

## 2021-06-20 NOTE — Care Management Obs Status (Signed)
MEDICARE OBSERVATION STATUS NOTIFICATION ? ? ?Patient Details  ?Name: Cindy [name] ?MRN: [id number] ?Date of Birth: March 03, 1933 ? ? ?Medicare Observation Status Notification Given:  Yes ? ? ? ?Tommy Medal ?06/20/2021, 3:43 PM ?

## 2021-06-20 NOTE — Assessment & Plan Note (Addendum)
-  Continue Eliquis and metoprolol ?-Rate overall well controlled. ?

## 2021-06-20 NOTE — TOC Transition Note (Signed)
Transition of Care (TOC) - CM/SW Discharge Note ? ? ?Patient Details  ?Name: Cindy [name] ?MRN: [id number] ?Date of Birth: Oct 27, 1932 ? ?Transition of Care (TOC) CM/SW Contact:  ?Boneta Lucks, RN ?Phone Number: ?06/20/2021, 1:49 PM ? ? ?Clinical Narrative:   Patient admitted in OBS with acute on chronic respiratory failure with hypoxia. Patient uses home oxygen as needed. DC today pending echo results. Patient requesting home health with [facility]. MD asked to order RN/PT. Vaughan Basta accepted the referral, added to AVS. ? ? ?Final next level of care: [place] ?Barriers to Discharge: No Barriers Identified ? ?Patient Goals and CMS Choice ?Patient states their goals for this hospitalization and ongoing recovery are:: to go home. ?CMS Medicare.gov Compare Post Acute Care list provided to:: Patient ?Choice offered to / list presented to : Patient ? ?Discharge Placement ?   ? Patient and family notified of of transfer: 06/20/21 ? ?Discharge Plan and Services ?    ?HH Arranged: Therapist, sports, PT ?[place] Agency: [place] ([place]) ?Date HH Agency Contacted: 06/20/21 ?Time [place]: 2355 ?Representative spoke with at [place]: Vaughan Basta ?Readmission Risk Interventions ?Readmission Risk Prevention Plan 08/19/2020  ?Transportation Screening Complete  ?Home Care Screening Complete  ?Medication Review (RN CM) Complete  ?Some recent data might be hidden  ? ? ? ? ? ?

## 2021-06-20 NOTE — Progress Notes (Signed)
?  Echocardiogram ?2D Echocardiogram has been performed. ? ?Cindy [name] ?06/20/2021, 10:54 AM ?

## 2021-06-20 NOTE — Progress Notes (Signed)
Nsg Discharge Note ? ?Admit Date:  06/19/2021 ?Discharge date: 06/20/2021 ?  ?[name] to be D/C'd Home per MD order.  AVS completed. ?Patient/caregiver able to verbalize understanding. ? ?Discharge Medication: ?Allergies as of 06/20/2021   ? ?   Reactions  ? Nebivolol Hcl   ? Other reaction(s): Other (See Comments) ?Chest tightness,sob,cough,severe tiredness,confusion  ? Codeine Nausea Only  ? Sulfa Antibiotics Nausea Only  ? Sulfonamide Derivatives Nausea Only  ? ?  ? ?  ?Medication List  ?  ? ?STOP taking these medications   ? ?albuterol (2.5 MG/3ML) 0.083% nebulizer solution ?Commonly known as: PROVENTIL ?  ?nitrofurantoin (macrocrystal-monohydrate) 100 MG capsule ?Commonly known as: MACROBID ?  ?warfarin 2.5 MG tablet ?Commonly known as: COUMADIN ?  ? ?  ? ?TAKE these medications   ? ?acetaminophen 500 MG tablet ?Commonly known as: TYLENOL ?Take 1,000 mg by mouth as needed. ?  ?ALPRAZolam 0.5 MG tablet ?Commonly known as: Xanax ?Take 1 tablet (0.5 mg total) by mouth at bedtime as needed for anxiety. ?  ?apixaban 5 MG Tabs tablet ?Commonly known as: ELIQUIS ?Take 5 mg by mouth 2 (two) times daily. ?  ?calcium carbonate 500 MG chewable tablet ?Commonly known as: TUMS - dosed in mg elemental calcium ?Chew 1 tablet by mouth daily. ?  ?diclofenac Sodium 1 % Gel ?Commonly known as: VOLTAREN ?Apply topically. ?  ?esomeprazole 40 MG capsule ?Commonly known as: [place] ?Take 1 capsule (40 mg total) by mouth every evening. ?  ?fentaNYL 25 MCG/HR ?Commonly known as: [place] ?Place 1 patch onto the skin every 3 (three) days. ?  ?ipratropium-albuterol 0.5-2.5 (3) MG/3ML Soln ?Commonly known as: DUONEB ?Take 3 mLs by nebulization every 4 (four) hours as needed (Shortness of breath and wheezing). ?  ?levothyroxine 112 MCG tablet ?Commonly known as: SYNTHROID ?Take 112 mcg by mouth daily. ?  ?metoprolol succinate 25 MG 24 hr tablet ?Commonly known as: TOPROL-XL ?Take 0.5-1 tablets (12.5-25 mg total) by mouth in the  morning and at bedtime. Patient takes 1 tablet (17m) in the morning and 1/2 of a tablet (12.530m in the evening ?What changed:  ?how much to take ?when to take this ?additional instructions ?  ?ondansetron 4 MG tablet ?Commonly known as: ZOFRAN ?Take 4 mg by mouth every 8 (eight) hours as needed. ?  ?potassium chloride SA 20 MEQ tablet ?Commonly known as: KLOR-CON M ?Take 10 mEq by mouth See admin instructions. Take 10 meq 4 times a week ?  ?predniSONE 10 MG tablet ?Commonly known as: DELTASONE ?Take 4 tablets by mouth daily x1 day; then 3 tablets by mouth daily x1 day; then 2 tablet by mouth daily x2 days; then 1 tablet by mouth daily x3 days and stop prednisone. ?  ?PRESERVISION AREDS 2 PO ?Take 1 tablet by mouth daily. ?What changed: Another medication with the same name was removed. Continue taking this medication, and follow the directions you see here. ?  ?senna 8.6 MG tablet ?Commonly known as: SENOKOT ?Take 2 tablets by mouth as needed for constipation. ?  ?sodium chloride 0.65 % nasal spray ?Commonly known as: OCEAN ?Place 2 sprays into the nose daily as needed. ?  ?SYSTANE OP ?Apply 1 drop to eye daily. ?  ?torsemide 20 MG tablet ?Commonly known as: DEMADEX ?Take 20 mg by mouth daily. ?  ? ?  ? ?  ?  ? ? ?  ?Durable Medical Equipment  ?(From admission, onward)  ?  ? ? ?  ? ?  Start     Ordered  ? 06/20/21 1615  For home use only DME Nebulizer machine  Once       ?Question Answer Comment  ?Patient needs a nebulizer to treat with the following condition Pulmonary fibrosis ([place])   ?Length of Need Lifetime   ?  ? 06/20/21 1614  ? ?  ?  ? ?  ? ? ?Discharge Assessment: ?Vitals:  ? 06/20/21 0825 06/20/21 0913  ?BP: 118/75 104/68  ?Pulse: 91 91  ?Resp:  17  ?Temp:  97.7 ?F (36.5 ?C)  ?SpO2:  94%  ? Skin clean, dry and intact without evidence of skin break down, no evidence of skin tears noted. ?IV catheter discontinued intact. Site without signs and symptoms of complications - no redness or edema noted at  insertion site, patient denies c/o pain - only slight tenderness at site.  Dressing with slight pressure applied. ? ?D/c Instructions-Education: ?Discharge instructions given to patient/family with verbalized understanding. ?D/c education completed with patient/family including follow up instructions, medication list, d/c activities limitations if indicated, with other d/c instructions as indicated by MD - patient able to verbalize understanding, all questions fully answered. ?Patient instructed to return to ED, call 911, or call MD for any changes in condition.  ?Patient escorted via [place], and D/C home via private auto. ? ?Kathie Rhodes, RN ?06/20/2021 5:13 PM  ?

## 2021-06-20 NOTE — Discharge Summary (Signed)
Physician Discharge Summary   Patient: Cindy [name] MRN: [id number] DOB: Aug 19, 1932  Admit date:     06/19/2021  Discharge date: 06/20/21  Discharge Physician: Barton Dubois   PCP: Celene Squibb, MD   Recommendations at discharge:  Repeat basic metabolic panel to follow ultralights renal function Reassess blood pressure and adjust antihypertensive treatment as needed Follow improvement in patient's shortness of breath after request of using oxygen 2 L continuously and initiation of as needed nebulizer management. Please assist/encourage patient to see pulmonologist as an outpatient.  Discharge Diagnoses: Principal Problem:   Acute on chronic respiratory failure with hypoxia (HCC) Active Problems:   Hypothyroidism   GERD (gastroesophageal reflux disease)   CHF NYHA class III (symptoms with mildly strenuous activities), acute on chronic, diastolic (HCC)   Paroxysmal atrial fibrillation (HCC)   COPD exacerbation [facility])   Hospital admission narrative course: As per H&P written by Dr.Zierle-Ghosh on 06/20/21 Cindy [name] is a 88 y.o. female  with history of CHF, cancer,, diverticulosis, fibrosis of the lung, GERD, ulcerative colitis, osteoporosis, hyperlipidemia, paroxysmal atrial fibrillation, blood clots, and more presents to ED with a chief complaint of dyspnea.  Patient reports that at 830 this morning she was taking a pill when she suddenly developed a cough.  She reports she was coughing so hard she could not catch her breath, and the cough is productive of yellow sputum.  She reports the sputum was thick and she choked on it.  She could hear rattling and wheezing in her chest.  This went on after lunchtime, and she decided to come into the ER.  She does have as needed oxygen at home which she uses if her oxygen sats drop below 90.  She cannot remember if she had to use it today or not.  Patient does have a history of blood clots per her report.  She reports last year she had DVTs.   She has been taking her Eliquis regularly.  Patient does have lymphedema of the bilateral lower extremities and gets weekly massages and wraps for her legs.  Patient denies unilateral leg swelling, any long car rides/plane ride/train ride/trips, hormone use, recent surgery, and hemoptysis.  Patient also denies fever and diaphoresis.  She does report that she has been weak and tired and feeling short of breath for months.  There is no change to that.  She is not following with a pulmonologist at this time.  She reports she was seeing a pulmonologist at [facility] with their transportation barriers for that specialist.  Patient reports she has never had a diagnosis of COPD but she has had experience secondhand smoke regularly from her husband and at her old job.  Patient denies any chest pain, palpitations.  She is not coughing at all during the exam.  She reports she was worried that this cough had something to do with her CHF.  She has no sick contacts.  Patient has no other complaints.  Assessment and Plan: * Acute on chronic respiratory failure with hypoxia (HCC) -Oxygen saturations dropping down to 86% on room air -Excellent response to supplementation with 2 L of nasal cannula oxygen and treatment with nebulizer management and Lasix. -Acute respiratory exacerbation appears to be a combination of pulmonary fibrosis flare along with mild CHF exacerbation. -Patient will complete steroid tapering, follow daily weights, strict I's and O's and resume diuretics at discharge. -Outpatient follow-up with pulmonology service -Prescription for nebulizer machine and as needed DuoNeb provided at discharge. -Patient has been  encouraged to use oxygen 2 L on regular basis.  CHF NYHA class III (symptoms with mildly strenuous activities), acute on chronic, diastolic (HCC) - Very mild exacerbation appreciated during this hospitalization. -As mentioned above appropriately treated with IV Lasix and excellent response  appreciated. -Resume prior to admission torsemide diuretics dosage. -Continue patient follow-up with cardiology service -Continue to follow low-sodium diet, maintain adequate hydration and check weight on daily basis.  COPD exacerbation ([place]) -Treatment as mentioned above. -Steroid tapering and prescription for nebulizer machine and as needed bronchodilators using DuoNeb. -Recommending outpatient follow-up with pulmonologist.  Paroxysmal atrial fibrillation ([place]) -Continue Eliquis and metoprolol -Rate overall well controlled.  GERD (gastroesophageal reflux disease) -Continue Protonix -Lifestyle modifications discussed with patient.  Hypothyroidism -Continue Synthroid -Follow thyroid panel and further adjust Synthroid as needed.  Consultants: None Procedures performed: See below for x-ray reports. 2-D echo:  1. Left ventricular ejection fraction, by estimation, is 60 to 65%. The  left ventricle has normal function. The left ventricle has no regional  wall motion abnormalities. There is moderate left ventricular hypertrophy.  Left ventricular diastolic  parameters are indeterminate.   2. RV poorly visualized. Grossly appears mild to moderately enlarged with  low normal systolic function. . Right ventricular systolic function was  not well visualized. The right ventricular size is not well visualized.  There is moderately elevated  pulmonary artery systolic pressure.   3. The mitral valve is abnormal. Mild mitral valve regurgitation. No  evidence of mitral stenosis.   4. The tricuspid valve is abnormal. Tricuspid valve regurgitation is  moderate.   5. The aortic valve is tricuspid. Aortic valve regurgitation is not  visualized. No aortic stenosis is present.   6. The inferior vena cava is normal in size with <50% respiratory  variability, suggesting right atrial pressure of 8 mmHg.   Disposition: Discharged home with home health services.  Diet recommendation: Heart  healthy/low-sodium diet recommended.  DISCHARGE MEDICATION: Allergies as of 06/20/2021       Reactions   Nebivolol Hcl    Other reaction(s): Other (See Comments) Chest tightness,sob,cough,severe tiredness,confusion   Codeine Nausea Only   Sulfa Antibiotics Nausea Only   Sulfonamide Derivatives Nausea Only        Medication List     STOP taking these medications    albuterol (2.5 MG/3ML) 0.083% nebulizer solution Commonly known as: PROVENTIL   nitrofurantoin (macrocrystal-monohydrate) 100 MG capsule Commonly known as: MACROBID   warfarin 2.5 MG tablet Commonly known as: COUMADIN       TAKE these medications    acetaminophen 500 MG tablet Commonly known as: TYLENOL Take 1,000 mg by mouth as needed.   ALPRAZolam 0.5 MG tablet Commonly known as: Xanax Take 1 tablet (0.5 mg total) by mouth at bedtime as needed for anxiety.   apixaban 5 MG Tabs tablet Commonly known as: ELIQUIS Take 5 mg by mouth 2 (two) times daily.   calcium carbonate 500 MG chewable tablet Commonly known as: TUMS - dosed in mg elemental calcium Chew 1 tablet by mouth daily.   diclofenac Sodium 1 % Gel Commonly known as: VOLTAREN Apply topically.   esomeprazole 40 MG capsule Commonly known as: NEXIUM Take 1 capsule (40 mg total) by mouth every evening.   fentaNYL 25 MCG/HR Commonly known as: [place] 1 patch onto the skin every 3 (three) days.   ipratropium-albuterol 0.5-2.5 (3) MG/3ML Soln Commonly known as: DUONEB Take 3 mLs by nebulization every 4 (four) hours as needed (Shortness of breath and wheezing).  levothyroxine 112 MCG tablet Commonly known as: SYNTHROID Take 112 mcg by mouth daily.   metoprolol succinate 25 MG 24 hr tablet Commonly known as: TOPROL-XL Take 0.5-1 tablets (12.5-25 mg total) by mouth in the morning and at bedtime. Patient takes 1 tablet (68m) in the morning and 1/2 of a tablet (12.565m in the evening What changed:  how much to take when to take  this additional instructions   ondansetron 4 MG tablet Commonly known as: ZOFRAN Take 4 mg by mouth every 8 (eight) hours as needed.   potassium chloride SA 20 MEQ tablet Commonly known as: KLOR-CON M Take 10 mEq by mouth See admin instructions. Take 10 meq 4 times a week   predniSONE 10 MG tablet Commonly known as: DELTASONE Take 4 tablets by mouth daily x1 day; then 3 tablets by mouth daily x1 day; then 2 tablet by mouth daily x2 days; then 1 tablet by mouth daily x3 days and stop prednisone.   PRESERVISION AREDS 2 PO Take 1 tablet by mouth daily. What changed: Another medication with the same name was removed. Continue taking this medication, and follow the directions you see here.   senna 8.6 MG tablet Commonly known as: SENOKOT Take 2 tablets by mouth as needed for constipation.   sodium chloride 0.65 % nasal spray Commonly known as: OCEAN Place 2 sprays into the nose daily as needed.   SYSTANE OP Apply 1 drop to eye daily.   torsemide 20 MG tablet Commonly known as: DEMADEX Take 20 mg by mouth daily.               Durable Medical Equipment  (From admission, onward)           Start     Ordered   06/20/21 1615  For home use only DME Nebulizer machine  Once       Question Answer Comment  Patient needs a nebulizer to treat with the following condition Pulmonary fibrosis [facility]  Length of Need Lifetime      06/20/21 1614            Follow-up Information     [facility] Follow [facility].   Specialty: Home Health Services Why: RN/PT will call to schedule your first home visit.        HaCelene SquibbMD. Schedule an appointment as soon as possible for a visit in 10 day(s).   Specialty: Internal Medicine Contact information: [phone]               Discharge Exam: Filed Weights   06/19/21 1723 06/20/21 0129 06/20/21 0359  Weight: 54.9 kg 56.8 kg 56.6 kg   General exam:  Alert, awake, oriented x 3; good oxygen saturation while using 2 L supplementation.  Speaking in full sentences and reporting no shortness of breath or chest pain. Respiratory system: Scattered rhonchi; no significant expiratory wheezing, no using accessory muscles. Cardiovascular system: Rate controlled, no rubs, no gallops, no JVD. Gastrointestinal system: Abdomen is nondistended, soft and nontender. No organomegaly or masses felt. Normal bowel sounds heard. Central nervous system: Alert and oriented. No focal neurological deficits. Extremities: No cyanosis or clubbing; trace pedal edema appreciated bilaterally. Skin: No petechiae. Psychiatry: Judgement and insight appear normal. Mood & affect appropriate.    Condition at discharge: Stable and improved.  The results of significant diagnostics from this hospitalization (including imaging, microbiology, ancillary and laboratory) are listed below for reference.  Imaging Studies: DG Chest 2 View  Result Date: 06/19/2021 CLINICAL DATA:  Short of breath, productive cough, weakness EXAM: CHEST - 2 VIEW COMPARISON:  01/09/2021 FINDINGS: Frontal and lateral views of the chest demonstrates stable dual lead pacer. The cardiac silhouette is unremarkable. Chronic fibrotic changes are again noted, without superimposed airspace disease. There is a small right pleural effusion. No pneumothorax. IMPRESSION: 1. Trace right pleural effusion. 2. Chronic parenchymal scarring and fibrosis. No acute airspace disease. Electronically Signed   By: Randa Ngo M.D.   On: 06/19/2021 18:02   Portable chest 1 View  Result Date: 06/20/2021 CLINICAL DATA:  88 year old female with shortness of breath. Acute on chronic respiratory failure. EXAM: PORTABLE CHEST 1 VIEW COMPARISON:  Chest radiographs 06/19/2021 and earlier. FINDINGS: Portable AP upright view at 0510 hours. Chronic hyperinflation. Coarse widespread chronic pulmonary interstitial opacity appears stable since last  year. Stable cardiomegaly and mediastinal contours. Stable left chest cardiac pacemaker. Visualized tracheal air column is within normal limits. No pneumothorax or acute pulmonary opacity. No acute osseous abnormality identified. IMPRESSION: Stable chronic lung disease.  No acute cardiopulmonary abnormality. Electronically Signed   By: Genevie Ann M.D.   On: 06/20/2021 06:44    Microbiology: Results for orders placed or performed during the hospital encounter of 06/19/21  Resp Panel by RT-PCR (Flu A&B, Covid) Nasopharyngeal Swab     Status: None   Collection Time: 06/19/21 10:08 PM   Specimen: Nasopharyngeal Swab; Nasopharyngeal(NP) swabs in vial transport medium  Result Value Ref Range Status   SARS Coronavirus 2 by RT PCR NEGATIVE NEGATIVE Final    Comment: (NOTE) SARS-CoV-2 target nucleic acids are NOT DETECTED.  The SARS-CoV-2 RNA is generally detectable in upper respiratory specimens during the acute phase of infection. The lowest concentration of SARS-CoV-2 viral copies this assay can detect is 138 copies/mL. A negative result does not preclude SARS-Cov-2 infection and should not be used as the sole basis for treatment or other patient management decisions. A negative result may occur with  improper specimen collection/handling, submission of specimen other than nasopharyngeal swab, presence of viral mutation(s) within the areas targeted by this assay, and inadequate number of viral copies(<138 copies/mL). A negative result must be combined with clinical observations, patient history, and epidemiological information. The expected result is Negative.  Fact Sheet for Patients:  EntrepreneurPulse.com.au  Fact Sheet for Healthcare Providers:  IncredibleEmployment.be  This test is no t yet approved or cleared by the [place] FDA and  has been authorized for detection and/or diagnosis of SARS-CoV-2 by FDA under an Emergency Use Authorization (EUA).  This EUA will remain  in effect (meaning this test can be used) for the duration of the COVID-19 declaration under Section 564(b)(1) of the Act, 21 U.S.C.section 360bbb-3(b)(1), unless the authorization is terminated  or revoked sooner.       Influenza A by PCR NEGATIVE NEGATIVE Final   Influenza B by PCR NEGATIVE NEGATIVE Final    Comment: (NOTE) The Xpert Xpress SARS-CoV-2/FLU/RSV plus assay is intended as an aid in the diagnosis of influenza from Nasopharyngeal swab specimens and should not be used as a sole basis for treatment. Nasal washings and aspirates are unacceptable for Xpert Xpress SARS-CoV-2/FLU/RSV testing.  Fact Sheet for Patients: EntrepreneurPulse.com.au  Fact Sheet for Healthcare Providers: IncredibleEmployment.be  This test is not yet approved or cleared by the [place] FDA and has been authorized for detection and/or diagnosis of SARS-CoV-2 by FDA under an Emergency Use Authorization (EUA). This EUA will remain in  effect (meaning this test can be used) for the duration of the COVID-19 declaration under Section 564(b)(1) of the Act, 21 U.S.C. section 360bbb-3(b)(1), unless the authorization is terminated or revoked.  Performed at [facility], [address], [place], [place] [address]     Labs: CBC: Recent Labs  Lab 06/19/21 1737 06/20/21 0537  WBC 7.6 5.4  NEUTROABS  --  4.9  HGB 13.5 12.4  HCT 41.4 37.2  MCV 100.5* 98.4  PLT 178 269   Basic Metabolic Panel: Recent Labs  Lab 06/19/21 1737 06/20/21 0537  NA 136 134*  K 4.1 3.8  CL 102 101  CO2 26 24  GLUCOSE 99 179*  BUN 19 18  CREATININE 0.65 0.68  CALCIUM 9.0 8.8*  MG  --  1.8   Liver Function Tests: Recent Labs  Lab 06/20/21 0537  AST 23  ALT 10  ALKPHOS 88  BILITOT 0.8  PROT 6.7  ALBUMIN 3.4*    Discharge time spent: less than 30 minutes.  Signed: Barton Dubois, MD [facility] 06/20/2021

## 2021-06-20 NOTE — Progress Notes (Signed)
Echo attempted. Patient just beginning breakfast. Will attempt again as schedule permits. ?

## 2021-06-20 NOTE — Assessment & Plan Note (Addendum)
-  Continue Protonix ?-Lifestyle modifications discussed with patient. ?

## 2021-06-20 NOTE — Assessment & Plan Note (Addendum)
-  Continue Synthroid ?-Follow thyroid panel and further adjust Synthroid as needed. ?

## 2021-06-21 NOTE — Assessment & Plan Note (Signed)
-  Treatment as mentioned above. ?-Steroid tapering and prescription for nebulizer machine and as needed bronchodilators using DuoNeb. ?-Recommending outpatient follow-up with pulmonologist. ?

## 2021-06-21 NOTE — Assessment & Plan Note (Signed)
-   Very mild exacerbation appreciated during this hospitalization. ?-As mentioned above appropriately treated with IV Lasix and excellent response appreciated. ?-Resume prior to admission torsemide diuretics dosage. ?-Continue patient follow-up with cardiology service ?-Continue to follow low-sodium diet, maintain adequate hydration and check weight on daily basis. ?

## 2021-06-23 DIAGNOSIS — I890 Lymphedema, not elsewhere classified: Secondary | ICD-10-CM | POA: Diagnosis not present

## 2021-06-24 DIAGNOSIS — E039 Hypothyroidism, unspecified: Secondary | ICD-10-CM | POA: Diagnosis not present

## 2021-06-24 DIAGNOSIS — I890 Lymphedema, not elsewhere classified: Secondary | ICD-10-CM | POA: Diagnosis not present

## 2021-06-24 DIAGNOSIS — J8410 Pulmonary fibrosis, unspecified: Secondary | ICD-10-CM | POA: Diagnosis not present

## 2021-06-24 DIAGNOSIS — E7849 Other hyperlipidemia: Secondary | ICD-10-CM | POA: Diagnosis not present

## 2021-06-24 DIAGNOSIS — K5190 Ulcerative colitis, unspecified, without complications: Secondary | ICD-10-CM | POA: Diagnosis not present

## 2021-06-24 DIAGNOSIS — K5790 Diverticulosis of intestine, part unspecified, without perforation or abscess without bleeding: Secondary | ICD-10-CM | POA: Diagnosis not present

## 2021-06-24 DIAGNOSIS — M810 Age-related osteoporosis without current pathological fracture: Secondary | ICD-10-CM | POA: Diagnosis not present

## 2021-06-24 DIAGNOSIS — K2100 Gastro-esophageal reflux disease with esophagitis, without bleeding: Secondary | ICD-10-CM | POA: Diagnosis not present

## 2021-06-24 DIAGNOSIS — Z79891 Long term (current) use of opiate analgesic: Secondary | ICD-10-CM | POA: Diagnosis not present

## 2021-06-24 DIAGNOSIS — I5033 Acute on chronic diastolic (congestive) heart failure: Secondary | ICD-10-CM | POA: Diagnosis not present

## 2021-06-24 DIAGNOSIS — Z7901 Long term (current) use of anticoagulants: Secondary | ICD-10-CM | POA: Diagnosis not present

## 2021-06-24 DIAGNOSIS — I480 Paroxysmal atrial fibrillation: Secondary | ICD-10-CM | POA: Diagnosis not present

## 2021-06-24 DIAGNOSIS — J9621 Acute and chronic respiratory failure with hypoxia: Secondary | ICD-10-CM | POA: Diagnosis not present

## 2021-06-24 DIAGNOSIS — Z9981 Dependence on supplemental oxygen: Secondary | ICD-10-CM | POA: Diagnosis not present

## 2021-06-24 DIAGNOSIS — J441 Chronic obstructive pulmonary disease with (acute) exacerbation: Secondary | ICD-10-CM | POA: Diagnosis not present

## 2021-06-25 ENCOUNTER — Encounter: Payer: Self-pay | Admitting: Pulmonary Disease

## 2021-06-25 ENCOUNTER — Ambulatory Visit (INDEPENDENT_AMBULATORY_CARE_PROVIDER_SITE_OTHER): Payer: Medicare Other | Admitting: Pulmonary Disease

## 2021-06-25 ENCOUNTER — Other Ambulatory Visit: Payer: Self-pay

## 2021-06-25 VITALS — BP 128/82 | HR 102

## 2021-06-25 DIAGNOSIS — I5033 Acute on chronic diastolic (congestive) heart failure: Secondary | ICD-10-CM | POA: Diagnosis not present

## 2021-06-25 DIAGNOSIS — J441 Chronic obstructive pulmonary disease with (acute) exacerbation: Secondary | ICD-10-CM | POA: Diagnosis not present

## 2021-06-25 DIAGNOSIS — I272 Pulmonary hypertension, unspecified: Secondary | ICD-10-CM | POA: Insufficient documentation

## 2021-06-25 DIAGNOSIS — I480 Paroxysmal atrial fibrillation: Secondary | ICD-10-CM | POA: Diagnosis not present

## 2021-06-25 DIAGNOSIS — J8410 Pulmonary fibrosis, unspecified: Secondary | ICD-10-CM | POA: Diagnosis not present

## 2021-06-25 DIAGNOSIS — I890 Lymphedema, not elsewhere classified: Secondary | ICD-10-CM | POA: Diagnosis not present

## 2021-06-25 DIAGNOSIS — J849 Interstitial pulmonary disease, unspecified: Secondary | ICD-10-CM

## 2021-06-25 DIAGNOSIS — J9621 Acute and chronic respiratory failure with hypoxia: Secondary | ICD-10-CM | POA: Diagnosis not present

## 2021-06-25 NOTE — Assessment & Plan Note (Addendum)
Reviewed scans dating back to 2021, prior pulmonary consultation. ?There is peripheral reticulation bilateral with some scarring in the right lower lobe , this could be related to previous insult including possible aspiration, note the finding of patulous esophagus.  Pulmonary amyloid is possible but there does not seem to be any progression over the last 2 to 3 years. ?She desaturates mildly on exertion but not to the point of requiring oxygen ?The low DLCO is out of proportion to lung volumes and is more likely related to pulmonary hypertension rather than ILD ? ?We will reassess for progression with HRCT in 11/2021 ?

## 2021-06-25 NOTE — Patient Instructions (Signed)
?   pulmonary hypertension is stable ? ?X High res CT chest in aug 2023 ?

## 2021-06-25 NOTE — Assessment & Plan Note (Signed)
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 ?This appears to be relatively stable with RVSP about 50 since 2022 ?This probably accounts for the low DLCO out of proportion to lung volumes ? ?

## 2021-06-25 NOTE — Progress Notes (Signed)
? ?Subjective:  ? ? Patient ID: Cindy [name], female    DOB: 25-Sep-1932, 88 y.o.   MRN: [id number] ? ?HPI ? ?Cindy [name] is an 88 year old never smoker with cardiac amyloid and pulmonary hypertension who presents to establish care for ILD and chronic hypoxic respiratory failure. ? ?I have reviewed her extensive records from [facility] cardiology- Dr Lora Paula, [facility] amyloid clinic Dr. Amalia Hailey, previous pulmonary evaluation by Dr. Farley Ly at [facility] and my partner Dr. Melvyn Novas ?She was diagnosed with cardiac amyloid in 2016 and was in fact on hospice for a while after experimental therapy, but now has been remarkably stable for many years, and off medication since 2020.  She was diagnosed with pulmonary emboli in 2021 in 2022 and is now on lifetime anticoagulation ?She has chronic lower extremity edema since 2021 which was attributed to right heart failure and now to lymphedema ? ?She has oxygen which she uses on unusual exertion does not sleep with this.  She reports chronic pain and been on hospice she was placed on a fentanyl patch that she still uses ? ?She would like to switch from pulmonary care to [place], last evaluation at [facility] pulmonology noted.  Was felt that her ILD was stable and unlikely to be pulmonary amyloid,?  Sequelae of aspiration was possible due to patulous esophagus.  Dyspnea was attributed to pulmonary hypertension 6-minute walk was about 200 m ? ?She had an acute ED visit on 3/3 for chest tightness and shortness of breath and improved with albuterol nebs, she is almost back to baseline, denies coughing wheezing ? ?PMH  ?- HFpEF ?- Afib s/p ablation and DCCV (2016), AV block requiring dual lead pacemaker (03/2017), ?- Cardiac amyloid not on current medications, with preserved EF but restricitive CM/ restrictive LV filling pattern ?-being followed by [facility] ?--> 2016 - bortezomid/cytoxan and VITAL study ?--> 2019/2020 - Isatuximab, stopped per patient ?- Pulmonary embolism x2 ?--> Acute PE 08/16/20 in RUL  and RML and in 07/08/2019 in the right main pulm artery and LUL ?- Macular degeneration ?-lymphedema ? ?Significant tests/ events reviewed ? ?Amb sat 06/2021 >> desat to 93% on walking at slow pace ? ? ?Echo 06/2021 RVSP elevated at 51 mm ?12/2020 TTE at [facility] ,estimated RVSP  50 mmHg, mod TR, gr 2 DD ? ?12/2020 VQ neg ? ?HRCT 11/2020  Mild peripheral reticulation in left upper lobe and superior segment of left lower lobe and posterior right upper lobe, may represent early interstitial lung abnormalities vs sequelae of prior aspiration.  ?Curvilinear scarring in right lower lobe.  ? ?Cardiomegaly and dilated main pulmonary artery measuring 3.5 cm, may represent elevated pulmonary arterial pressures.  ?Small right pleural effusion. ? ?PFTs (personally reviewed and interpreted)  ?- pfts 04/04/14  VC  2.10 (66%) no airflow obst   DLC0 48%  ? ?12/16/2020 FVC (% Pred) 2.05 (76.5) FEV1 (% Pred)  1.69 (84.5) DLCO  38.6% , TLC 88% ? ?6 Minute Walk:  ?11m O2 nadir 91% on room air  ? ?Past Medical History:  ?Diagnosis Date  ? Cancer ([facility]   ? CHF (congestive heart failure) ([place]   ? Amyloidosis  ? Diarrhea   ? Diverticulosis of colon (without mention of hemorrhage)   ? Edema   ? Esophagitis, unspecified   ? Family history of malignant neoplasm of gastrointestinal tract   ? [place] 03/14/2013  ? Flatulence, eructation, and gas pain   ? GERD (gastroesophageal reflux disease)   ? Left sided ulcerative colitis (  HCC)   ? Maxillary sinus mass   ? Osteoporosis, unspecified   ? Other and unspecified hyperlipidemia   ? Other and unspecified noninfectious gastroenteritis and colitis(558.9)   ? Paroxysmal atrial fibrillation ([place]) 07/09/2019  ? Stricture and stenosis of esophagus   ? Unspecified hypothyroidism   ? URI (upper respiratory infection)   ? ? ?Past Surgical History:  ?Procedure Laterality Date  ? ABDOMINAL HYSTERECTOMY    ? CATARACT EXTRACTION W/PHACO Bilateral   ? Dr. Gershon Crane  ? CHOLECYSTECTOMY    ? FOOT SURGERY    ?  THYROIDECTOMY, PARTIAL    ? YAG LASER APPLICATION Right 12/19/5174  ? Procedure: YAG LASER APPLICATION;  Surgeon: Elta Guadeloupe T. Gershon Crane, MD;  Location: [facility];  Service: Ophthalmology;  Laterality: Right;  ? ? ?Allergies  ?Allergen Reactions  ? Nebivolol Hcl   ?  Other reaction(s): Other (See Comments) ?Chest tightness,sob,cough,severe tiredness,confusion  ? Codeine Nausea Only  ? Sulfa Antibiotics Nausea Only  ? Sulfonamide Derivatives Nausea Only  ? ? ?Social History  ? ?Socioeconomic History  ? Marital status: Married  ?  Spouse name: Not on file  ? Number of children: 2  ? Years of education: Not on file  ? Highest education level: Not on file  ?Occupational History  ? Occupation: Retired  ?Tobacco Use  ? Smoking status: Never  ? Smokeless tobacco: Never  ?Vaping Use  ? Vaping Use: Never used  ?Substance and Sexual Activity  ? Alcohol use: No  ?  Alcohol/week: 0.0 standard drinks  ? Drug use: No  ? Sexual activity: Yes  ?  Birth control/protection: Surgical  ?Other Topics Concern  ? Not on file  ?Social History Narrative  ? Not on file  ? ?Social Determinants of Health  ? ?Financial Resource Strain: Not on file  ?Food Insecurity: Not on file  ?Transportation Needs: Not on file  ?Physical Activity: Not on file  ?Stress: Not on file  ?Social Connections: Not on file  ?Intimate Partner Violence: Not on file  ? ? ?Family History  ?Problem Relation Age of Onset  ? Colon cancer Mother   ? Heart attack Father   ?     CHF  ? Stomach cancer Sister   ? Diabetes Son   ? Kidney cancer Son   ? Adrenal disorder Son   ? Heart disease Brother   ? ? ? ? ?Review of Systems ?Constitutional: negative for anorexia, fevers and sweats  ?Eyes: negative for irritation, redness and visual disturbance  ?Ears, nose, mouth, throat, and face: negative for earaches, epistaxis, nasal congestion and sore throat  ?Respiratory: negative for cough, sputum and wheezing  ?Cardiovascular: negative for chest pain, palpitations and syncope  ?Gastrointestinal:  negative for abdominal pain, constipation, diarrhea, melena, nausea and vomiting  ?Genitourinary:negative for dysuria, frequency and hematuria  ?Hematologic/lymphatic: negative for bleeding, easy bruising and lymphadenopathy  ?Musculoskeletal:negative for arthralgias, muscle weakness ?Neurological: negative for coordination problems, gait problems, headaches and weakness  ?Endocrine: negative for diabetic symptoms including polydipsia, polyuria and weight loss ? ?   ?Objective:  ? Physical Exam ? ?Gen. Pleasant, thin, frail in no distress, normal affect ?ENT - no pallor,icterus, no post nasal drip ?Neck: No JVD, no thyromegaly, no carotid bruits ?Lungs: no use of accessory muscles, no dullness to percussion, clear without rales or rhonchi  ?Cardiovascular: Rhythm regular, heart sounds  normal, no murmurs or gallops, 2+ peripheral edema ?Abdomen: soft and non-tender, no hepatosplenomegaly, BS normal. ?Musculoskeletal: No deformities, no cyanosis or clubbing ?Neuro:  alert, non focal ? ? ? ?   ?Assessment & Plan:  ? ? ? ?

## 2021-06-26 DIAGNOSIS — I890 Lymphedema, not elsewhere classified: Secondary | ICD-10-CM | POA: Diagnosis not present

## 2021-06-30 DIAGNOSIS — I890 Lymphedema, not elsewhere classified: Secondary | ICD-10-CM | POA: Diagnosis not present

## 2021-07-01 DIAGNOSIS — J9601 Acute respiratory failure with hypoxia: Secondary | ICD-10-CM | POA: Diagnosis not present

## 2021-07-02 DIAGNOSIS — I890 Lymphedema, not elsewhere classified: Secondary | ICD-10-CM | POA: Diagnosis not present

## 2021-07-31 DIAGNOSIS — Z950 Presence of cardiac pacemaker: Secondary | ICD-10-CM | POA: Diagnosis not present

## 2021-08-08 DIAGNOSIS — Z20822 Contact with and (suspected) exposure to covid-19: Secondary | ICD-10-CM | POA: Diagnosis not present

## 2021-08-09 DIAGNOSIS — Z950 Presence of cardiac pacemaker: Secondary | ICD-10-CM | POA: Diagnosis not present

## 2021-08-11 ENCOUNTER — Ambulatory Visit (INDEPENDENT_AMBULATORY_CARE_PROVIDER_SITE_OTHER): Payer: Medicare Other | Admitting: Ophthalmology

## 2021-08-11 ENCOUNTER — Encounter (INDEPENDENT_AMBULATORY_CARE_PROVIDER_SITE_OTHER): Payer: Self-pay | Admitting: Ophthalmology

## 2021-08-11 DIAGNOSIS — H353221 Exudative age-related macular degeneration, left eye, with active choroidal neovascularization: Secondary | ICD-10-CM | POA: Diagnosis not present

## 2021-08-11 DIAGNOSIS — H35352 Cystoid macular degeneration, left eye: Secondary | ICD-10-CM | POA: Diagnosis not present

## 2021-08-11 MED ORDER — BEVACIZUMAB 2.5 MG/0.1ML IZ SOSY
2.5000 mg | PREFILLED_SYRINGE | INTRAVITREAL | Status: AC | PRN
  Administered 2021-08-11: 2.5 mg via INTRAVITREAL

## 2021-08-11 NOTE — Progress Notes (Signed)
? ? ?08/11/2021 ? ?  ? ?CHIEF COMPLAINT ?Patient presents for  ?Chief Complaint  ?Patient presents with  ? Macular Degeneration  ? ? ? ? ?HISTORY OF PRESENT ILLNESS: ?Cindy [name] is a 88 y.o. female who presents to the clinic today for:  ? ?HPI   ?12 weeks dilate OS, Avastin OS, OCT. ?"Sometimes I think I need magnifying glasses. I buy those over the counter glasses but they don't help me as good as I think they should. Anything small on t.v. I can't see it. I used to [place], I haven't seen him lately. He used to be in [place] now he is here all the time." ?Last edited by Laurin Coder on 08/11/2021 11:00 AM.  ?  ? ? ?Referring physician: ?Rutherford Guys, MD ?[address] ?[place],  [place] [address] ? ?HISTORICAL INFORMATION:  ? ?Selected notes from the [facility] ?  ? ?Lab Results  ?Component Value Date  ? HGBA1C 6.1 (H) 03/14/2013  ?  ? ?CURRENT MEDICATIONS: ?Current Outpatient Medications (Ophthalmic Drugs)  ?Medication Sig  ? Polyethyl Glycol-Propyl Glycol (SYSTANE OP) Apply 1 drop to eye daily.  ? ?No current facility-administered medications for this visit. (Ophthalmic Drugs)  ? ?Current Outpatient Medications (Other)  ?Medication Sig  ? acetaminophen (TYLENOL) 500 MG tablet Take 1,000 mg by mouth as needed.  ? ALPRAZolam (XANAX) 0.5 MG tablet Take 1 tablet (0.5 mg total) by mouth at bedtime as needed for anxiety.  ? apixaban (ELIQUIS) 5 MG TABS tablet Take 5 mg by mouth 2 (two) times daily.  ? calcium carbonate (TUMS - DOSED IN MG ELEMENTAL CALCIUM) 500 MG chewable tablet Chew 1 tablet by mouth daily.  ? diclofenac Sodium (VOLTAREN) 1 % GEL Apply topically.  ? esomeprazole (NEXIUM) 40 MG capsule Take 1 capsule (40 mg total) by mouth every evening.  ? fentaNYL (DURAGESIC) 25 MCG/HR Place 1 patch onto the skin every 3 (three) days.  ? ipratropium-albuterol (DUONEB) 0.5-2.5 (3) MG/3ML SOLN Take 3 mLs by nebulization every 4 (four) hours as needed (Shortness of breath and wheezing).  ?  levothyroxine (SYNTHROID) 112 MCG tablet Take 112 mcg by mouth daily.  ? metoprolol succinate (TOPROL-XL) 25 MG 24 hr tablet Take 0.5-1 tablets (12.5-25 mg total) by mouth in the morning and at bedtime. Patient takes 1 tablet (38m) in the morning and 1/2 of a tablet (12.529m in the evening (Patient taking differently: Take 25 mg by mouth daily.)  ? Multiple Vitamins-Minerals (PRESERVISION AREDS 2 PO) Take 1 tablet by mouth daily.  ? ondansetron (ZOFRAN) 4 MG tablet Take 4 mg by mouth every 8 (eight) hours as needed.  ? potassium chloride SA (KLOR-CON) 20 MEQ tablet Take 10 mEq by mouth See admin instructions. Take 10 meq 4 times a week  ? predniSONE (DELTASONE) 10 MG tablet Take 4 tablets by mouth daily x1 day; then 3 tablets by mouth daily x1 day; then 2 tablet by mouth daily x2 days; then 1 tablet by mouth daily x3 days and stop prednisone.  ? senna (SENOKOT) 8.6 MG tablet Take 2 tablets by mouth as needed for constipation.  ? sodium chloride (OCEAN) 0.65 % nasal spray Place 2 sprays into the nose daily as needed.   ? torsemide (DEMADEX) 20 MG tablet Take 20 mg by mouth daily.  ? ?No current facility-administered medications for this visit. (Other)  ? ? ? ? ?REVIEW OF SYSTEMS: ?ROS   ?Negative for: Constitutional, Gastrointestinal, Neurological, Skin, Genitourinary, Musculoskeletal, HENT, Endocrine, Cardiovascular, Eyes,  Respiratory, Psychiatric, Allergic/Imm, Heme/Lymph ?Last edited by Hurman Horn, MD on 08/11/2021 11:44 AM.  ?  ? ? ? ?ALLERGIES ?Allergies  ?Allergen Reactions  ? Nebivolol Hcl   ?  Other reaction(s): Other (See Comments) ?Chest tightness,sob,cough,severe tiredness,confusion  ? Codeine Nausea Only  ? Sulfa Antibiotics Nausea Only  ? Sulfonamide Derivatives Nausea Only  ? ? ?PAST MEDICAL HISTORY ?Past Medical History:  ?Diagnosis Date  ? Cancer [facility])   ? CHF (congestive heart failure) ([place])   ? Amyloidosis  ? Diarrhea   ? Diverticulosis of colon (without mention of hemorrhage)   ? Edema   ?  Esophagitis, unspecified   ? Family history of malignant neoplasm of gastrointestinal tract   ? Fibrosis of lung ([place]) 03/14/2013  ? Flatulence, eructation, and gas pain   ? GERD (gastroesophageal reflux disease)   ? Left sided ulcerative colitis ([place])   ? Maxillary sinus mass   ? Osteoporosis, unspecified   ? Other and unspecified hyperlipidemia   ? Other and unspecified noninfectious gastroenteritis and colitis(558.9)   ? Paroxysmal atrial fibrillation ([place]) 07/09/2019  ? Stricture and stenosis of esophagus   ? Unspecified hypothyroidism   ? URI (upper respiratory infection)   ? ?Past Surgical History:  ?Procedure Laterality Date  ? ABDOMINAL HYSTERECTOMY    ? CATARACT EXTRACTION W/PHACO Bilateral   ? Dr. Gershon Crane  ? CHOLECYSTECTOMY    ? FOOT SURGERY    ? THYROIDECTOMY, PARTIAL    ? YAG LASER APPLICATION Right 9/62/9528  ? Procedure: YAG LASER APPLICATION;  Surgeon: Elta Guadeloupe T. Gershon Crane, MD;  Location: [facility];  Service: Ophthalmology;  Laterality: Right;  ? ? ?FAMILY HISTORY ?Family History  ?Problem Relation Age of Onset  ? Colon cancer Mother   ? Heart attack Father   ?     CHF  ? Stomach cancer Sister   ? Diabetes Son   ? Kidney cancer Son   ? Adrenal disorder Son   ? Heart disease Brother   ? ? ?SOCIAL HISTORY ?Social History  ? ?Tobacco Use  ? Smoking status: Never  ? Smokeless tobacco: Never  ?Vaping Use  ? Vaping Use: Never used  ?Substance Use Topics  ? Alcohol use: No  ?  Alcohol/week: 0.0 standard drinks  ? Drug use: No  ? ?  ? ?  ? ?OPHTHALMIC EXAM: ? ?Base Eye Exam   ? ? Visual Acuity (ETDRS)   ? ?   Right Left  ? Dist [place] 20/40 20/400  ? Dist ph [place] NI 20/200  ? ?  ?  ? ? Tonometry (Tonopen, 11:05 AM)   ? ?   Right Left  ? Pressure 8 13  ? ?  ?  ? ? Pupils   ? ?   Pupils Dark Light React APD  ? Right PERRL 3 3 Minimal None  ? Left PERRL   Minimal None  ? ?  ?  ? ? Extraocular Movement   ? ?   Right Left  ?  Full Full  ? ?  ?  ? ? Neuro/Psych   ? ? Oriented x3: Yes  ? Mood/Affect: Normal  ? ?  ?  ? ? Dilation    ? ? Left eye: 1.0% Mydriacyl, 2.5% Phenylephrine @ 11:05 AM  ? ?  ?  ? ?  ? ?Slit Lamp and Fundus Exam   ? ? External Exam   ? ?   Right Left  ? External Normal Normal  ? ?  ?  ? ?  Slit Lamp Exam   ? ?   Right Left  ? Lids/Lashes Normal Normal  ? Conjunctiva/Sclera White and quiet White and quiet  ? Cornea Clear Clear  ? Anterior Chamber Deep and quiet Deep and quiet  ? Iris Round and reactive Round and reactive  ? Lens  Posterior chamber intraocular lens  ? Anterior Vitreous Normal Normal  ? ?  ?  ? ? Fundus Exam   ? ?   Right Left  ? Posterior Vitreous  ,,, Posterior vitreous detachment  ? Disc  Normal  ? C/D Ratio  0.1  ? Macula  Atrophy, Age related macular degeneration, Early age related macular degeneration, Drusen, new macular thickening, dry atrophic change in the macula  ? Vessels  Normal  ? Periphery  Normal  ? ?  ?  ? ?  ? ? ?IMAGING AND PROCEDURES  ?Imaging and Procedures for 08/11/21 ? ?Intravitreal Injection, Pharmacologic Agent - OS - Left Eye   ? ?   ?Time Out ?08/11/2021. 11:45 AM. Confirmed correct patient, procedure, site, and patient consented.  ? ?Anesthesia ?Topical anesthesia was used. Anesthetic medications included Lidocaine 4%.  ? ?Procedure ?Preparation included 10% betadine to eyelids, Tobramycin 0.3%, 5% betadine to ocular surface. A 30 gauge needle was used.  ? ?Injection: ?2.5 mg bevacizumab 2.5 MG/0.1ML ?  Route: Intravitreal, Site: Left Eye ?  [place]: 38333-832-91, Lot: [id number]  ? ?Post-op ?Post injection exam found visual acuity of at least counting fingers. The patient tolerated the procedure well. There were no complications. The patient received written and verbal post procedure care education. Post injection medications included ocuflox.  ? ?  ? ?OCT, Retina - OU - Both Eyes   ? ?   ?Right Eye ?Quality was good. Scan locations included subfoveal. Central Foveal Thickness: 313. Progression has been stable. Findings include abnormal foveal contour, retinal drusen .  ? ?Left  Eye ?Quality was good. Scan locations included subfoveal. Central Foveal Thickness: 321. Progression has been stable. Findings include retinal drusen , abnormal foveal contour.  ? ?Notes ?OS vastly improved as compare

## 2021-08-11 NOTE — Assessment & Plan Note (Signed)
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 ?4 24-20 2312-week interval ?

## 2021-08-11 NOTE — Assessment & Plan Note (Signed)
Today at 12-week interval, recurrent disease, will need to repeat injection today and regain control of the macular condition with Avastin at evaluation in 6 weeks next ?

## 2021-08-13 DIAGNOSIS — I480 Paroxysmal atrial fibrillation: Secondary | ICD-10-CM | POA: Diagnosis not present

## 2021-08-13 DIAGNOSIS — Z882 Allergy status to sulfonamides status: Secondary | ICD-10-CM | POA: Diagnosis not present

## 2021-08-13 DIAGNOSIS — E854 Organ-limited amyloidosis: Secondary | ICD-10-CM | POA: Diagnosis not present

## 2021-08-13 DIAGNOSIS — M549 Dorsalgia, unspecified: Secondary | ICD-10-CM | POA: Diagnosis not present

## 2021-08-13 DIAGNOSIS — J849 Interstitial pulmonary disease, unspecified: Secondary | ICD-10-CM | POA: Diagnosis not present

## 2021-08-13 DIAGNOSIS — E8581 Light chain (AL) amyloidosis: Secondary | ICD-10-CM | POA: Diagnosis not present

## 2021-08-13 DIAGNOSIS — Z885 Allergy status to narcotic agent status: Secondary | ICD-10-CM | POA: Diagnosis not present

## 2021-08-13 DIAGNOSIS — I5032 Chronic diastolic (congestive) heart failure: Secondary | ICD-10-CM | POA: Diagnosis not present

## 2021-08-13 DIAGNOSIS — Z7901 Long term (current) use of anticoagulants: Secondary | ICD-10-CM | POA: Diagnosis not present

## 2021-08-13 DIAGNOSIS — I890 Lymphedema, not elsewhere classified: Secondary | ICD-10-CM | POA: Diagnosis not present

## 2021-08-13 DIAGNOSIS — H3530 Unspecified macular degeneration: Secondary | ICD-10-CM | POA: Diagnosis not present

## 2021-08-13 DIAGNOSIS — Z86711 Personal history of pulmonary embolism: Secondary | ICD-10-CM | POA: Diagnosis not present

## 2021-08-13 DIAGNOSIS — I4891 Unspecified atrial fibrillation: Secondary | ICD-10-CM | POA: Diagnosis not present

## 2021-08-13 DIAGNOSIS — G8929 Other chronic pain: Secondary | ICD-10-CM | POA: Diagnosis not present

## 2021-08-13 DIAGNOSIS — I43 Cardiomyopathy in diseases classified elsewhere: Secondary | ICD-10-CM | POA: Diagnosis not present

## 2021-08-14 DIAGNOSIS — H938X3 Other specified disorders of ear, bilateral: Secondary | ICD-10-CM | POA: Diagnosis not present

## 2021-08-14 DIAGNOSIS — H6123 Impacted cerumen, bilateral: Secondary | ICD-10-CM | POA: Diagnosis not present

## 2021-08-14 DIAGNOSIS — L299 Pruritus, unspecified: Secondary | ICD-10-CM | POA: Diagnosis not present

## 2021-08-16 DIAGNOSIS — Z20822 Contact with and (suspected) exposure to covid-19: Secondary | ICD-10-CM | POA: Diagnosis not present

## 2021-08-19 DIAGNOSIS — Z20822 Contact with and (suspected) exposure to covid-19: Secondary | ICD-10-CM | POA: Diagnosis not present

## 2021-09-22 ENCOUNTER — Encounter (INDEPENDENT_AMBULATORY_CARE_PROVIDER_SITE_OTHER): Payer: Self-pay | Admitting: Ophthalmology

## 2021-09-22 ENCOUNTER — Ambulatory Visit (INDEPENDENT_AMBULATORY_CARE_PROVIDER_SITE_OTHER): Payer: Medicare Other | Admitting: Ophthalmology

## 2021-09-22 DIAGNOSIS — H35352 Cystoid macular degeneration, left eye: Secondary | ICD-10-CM

## 2021-09-22 DIAGNOSIS — H353221 Exudative age-related macular degeneration, left eye, with active choroidal neovascularization: Secondary | ICD-10-CM | POA: Diagnosis not present

## 2021-09-22 DIAGNOSIS — H353133 Nonexudative age-related macular degeneration, bilateral, advanced atrophic without subfoveal involvement: Secondary | ICD-10-CM

## 2021-09-22 MED ORDER — BEVACIZUMAB 2.5 MG/0.1ML IZ SOSY
2.5000 mg | PREFILLED_SYRINGE | INTRAVITREAL | Status: AC | PRN
  Administered 2021-09-22: 2.5 mg via INTRAVITREAL

## 2021-09-22 NOTE — Assessment & Plan Note (Signed)
OS, vastly improved today at 6-week interval post Avastin.  Repeat injection today follow-up again in 6 weeks to maintain improved acuity

## 2021-09-22 NOTE — Assessment & Plan Note (Signed)
No sign of CNVM OD

## 2021-09-22 NOTE — Progress Notes (Signed)
09/22/2021     CHIEF COMPLAINT Patient presents for  Chief Complaint  Patient presents with   Macular Degeneration      HISTORY OF PRESENT ILLNESS: Cindy [name] is a 88 y.o. female who presents to the clinic today for:   HPI   6 weeks dilate OU, Avastin OCT OS. Patient states vision is stable and unchanged since last visit. Denies any new floaters or FOL. Patient states "I can't read too well." Patient takes Preservision AREDS once daily. Last edited by Laurin Coder on 09/22/2021  9:20 AM.      Referring physician: Celene Squibb, MD [place],  [place] [id number]  HISTORICAL INFORMATION:   Selected notes from the MEDICAL RECORD NUMBER    Lab Results  Component Value Date   HGBA1C 6.1 (H) 03/14/2013     CURRENT MEDICATIONS: Current Outpatient Medications (Ophthalmic Drugs)  Medication Sig   Polyethyl Glycol-Propyl Glycol (SYSTANE OP) Apply 1 drop to eye daily.   No current facility-administered medications for this visit. (Ophthalmic Drugs)   Current Outpatient Medications (Other)  Medication Sig   acetaminophen (TYLENOL) 500 MG tablet Take 1,000 mg by mouth as needed.   ALPRAZolam (XANAX) 0.5 MG tablet Take 1 tablet (0.5 mg total) by mouth at bedtime as needed for anxiety.   apixaban (ELIQUIS) 5 MG TABS tablet Take 5 mg by mouth 2 (two) times daily.   calcium carbonate (TUMS - DOSED IN MG ELEMENTAL CALCIUM) 500 MG chewable tablet Chew 1 tablet by mouth daily.   diclofenac Sodium (VOLTAREN) 1 % GEL Apply topically.   esomeprazole (NEXIUM) 40 MG capsule Take 1 capsule (40 mg total) by mouth every evening.   fentaNYL (DURAGESIC) 25 MCG/HR Place 1 patch onto the skin every 3 (three) days.   ipratropium-albuterol (DUONEB) 0.5-2.5 (3) MG/3ML SOLN Take 3 mLs by nebulization every 4 (four) hours as needed (Shortness of breath and wheezing).   levothyroxine (SYNTHROID) 112 MCG tablet Take 112 mcg by mouth daily.   metoprolol succinate (TOPROL-XL) 25  MG 24 hr tablet Take 0.5-1 tablets (12.5-25 mg total) by mouth in the morning and at bedtime. Patient takes 1 tablet (8m) in the morning and 1/2 of a tablet (12.563m in the evening (Patient taking differently: Take 25 mg by mouth daily.)   Multiple Vitamins-Minerals (PRESERVISION AREDS 2 PO) Take 1 tablet by mouth daily.   ondansetron (ZOFRAN) 4 MG tablet Take 4 mg by mouth every 8 (eight) hours as needed.   potassium chloride SA (KLOR-CON) 20 MEQ tablet Take 10 mEq by mouth See admin instructions. Take 10 meq 4 times a week   predniSONE (DELTASONE) 10 MG tablet Take 4 tablets by mouth daily x1 day; then 3 tablets by mouth daily x1 day; then 2 tablet by mouth daily x2 days; then 1 tablet by mouth daily x3 days and stop prednisone.   senna (SENOKOT) 8.6 MG tablet Take 2 tablets by mouth as needed for constipation.   sodium chloride (OCEAN) 0.65 % nasal spray Place 2 sprays into the nose daily as needed.    torsemide (DEMADEX) 20 MG tablet Take 20 mg by mouth daily.   No current facility-administered medications for this visit. (Other)      REVIEW OF SYSTEMS: ROS   Negative for: Constitutional, Gastrointestinal, Neurological, Skin, Genitourinary, Musculoskeletal, HENT, Endocrine, Cardiovascular, Eyes, Respiratory, Psychiatric, Allergic/Imm, Heme/Lymph Last edited by RaHurman HornMD on 09/22/2021 10:20 AM.       ALLERGIES Allergies  Allergen Reactions   Nebivolol Hcl     Other reaction(s): Other (See Comments) Chest tightness,sob,cough,severe tiredness,confusion   Codeine Nausea Only   Sulfa Antibiotics Nausea Only   Sulfonamide Derivatives Nausea Only    PAST MEDICAL HISTORY Past Medical History:  Diagnosis Date   Cancer ([place])    CHF (congestive heart failure) (HCC)    Amyloidosis   Diarrhea    Diverticulosis of colon (without mention of hemorrhage)    Edema    Esophagitis, unspecified    Family history of malignant neoplasm of gastrointestinal tract    Fibrosis of lung  ([place]) 03/14/2013   Flatulence, eructation, and gas pain    GERD (gastroesophageal reflux disease)    Left sided ulcerative colitis ([place])    Maxillary sinus mass    Osteoporosis, unspecified    Other and unspecified hyperlipidemia    Other and unspecified noninfectious gastroenteritis and colitis(558.9)    Paroxysmal atrial fibrillation ([place]) 07/09/2019   Stricture and stenosis of esophagus    Unspecified hypothyroidism    URI (upper respiratory infection)    Past Surgical History:  Procedure Laterality Date   ABDOMINAL HYSTERECTOMY     CATARACT EXTRACTION W/PHACO Bilateral    Dr. Gershon Crane   CHOLECYSTECTOMY     FOOT SURGERY     THYROIDECTOMY, PARTIAL     YAG LASER APPLICATION Right 7/93/9030   Procedure: YAG LASER APPLICATION;  Surgeon: Elta Guadeloupe T. Gershon Crane, MD;  Location: [facility];  Service: Ophthalmology;  Laterality: Right;    FAMILY HISTORY Family History  Problem Relation Age of Onset   Colon cancer Mother    Heart attack Father        CHF   Stomach cancer Sister    Diabetes Son    Kidney cancer Son    Adrenal disorder Son    Heart disease Brother     SOCIAL HISTORY Social History   Tobacco Use   Smoking status: Never   Smokeless tobacco: Never  Vaping Use   Vaping Use: Never used  Substance Use Topics   Alcohol use: No    Alcohol/week: 0.0 standard drinks   Drug use: No         OPHTHALMIC EXAM:  Base Eye Exam     Visual Acuity (ETDRS)       Right Left   Dist [place] 20/40 -1 CF at 4'   Dist ph [place] NI 20/80 -1         Tonometry (Tonopen, 9:23 AM)       Right Left   Pressure 15 20         Pupils       Pupils Dark Light React APD   Right PERRL 3 3 Minimal None   Left PERRL 3 3 Minimal None         Extraocular Movement       Right Left    Full Full         Neuro/Psych     Oriented x3: Yes   Mood/Affect: Normal         Dilation     Both eyes: 1.0% Mydriacyl, 2.5% Phenylephrine @ 9:23 AM           Slit Lamp and Fundus Exam      External Exam       Right Left   External Normal Normal         Slit Lamp Exam       Right Left   Lids/Lashes Normal Normal  Conjunctiva/Sclera White and quiet White and quiet   Cornea Clear Clear   Anterior Chamber Deep and quiet Deep and quiet   Iris Round and reactive Round and reactive   Lens  Posterior chamber intraocular lens   Anterior Vitreous Normal Normal         Fundus Exam       Right Left   Posterior Vitreous  ,,, Posterior vitreous detachment   Disc  Normal   C/D Ratio  0.1   Macula  Atrophy, Age related macular degeneration, Early age related macular degeneration, Drusen, new macular thickening, dry atrophic change in the macula   Vessels  Normal   Periphery  Normal            IMAGING AND PROCEDURES  Imaging and Procedures for 09/22/21  OCT, Retina - OU - Both Eyes       Right Eye Quality was good. Scan locations included subfoveal. Central Foveal Thickness: 314. Progression has been stable. Findings include abnormal foveal contour, retinal drusen .   Left Eye Quality was good. Scan locations included subfoveal. Central Foveal Thickness: 255. Progression has improved. Findings include retinal drusen , abnormal foveal contour.   Notes OS vastly improved as compared to onset of CNVM at 6 week interval.  We will need to repeat injection Avastin and follow-up next in 6 weeks to regain control of macular disease left eye     Intravitreal Injection, Pharmacologic Agent - OS - Left Eye       Time Out 09/22/2021. 10:23 AM. Confirmed correct patient, procedure, site, and patient consented.   Anesthesia Topical anesthesia was used. Anesthetic medications included Lidocaine 4%.   Procedure Preparation included 10% betadine to eyelids, Tobramycin 0.3%, 5% betadine to ocular surface. A 30 gauge needle was used.   Injection: 2.5 mg bevacizumab 2.5 MG/0.1ML   Route: Intravitreal, Site: Left Eye   NDC: [phone], Lot: [id number], Expiration  date: 11/16/2021   Post-op Post injection exam found visual acuity of at least counting fingers. The patient tolerated the procedure well. There were no complications. The patient received written and verbal post procedure care education. Post injection medications included ocuflox.              ASSESSMENT/PLAN:  Exudative age-related macular degeneration of left eye with active choroidal neovascularization (HCC) OS, vastly improved today at 6-week interval post Avastin.  Repeat injection today follow-up again in 6 weeks to maintain improved acuity  Cystoid macular edema of left eye Compounded wet AMD improved at 6 weeks post Avastin  Advanced nonexudative age-related macular degeneration of both eyes without subfoveal involvement No sign of CNVM OD     ICD-10-CM   1. Exudative age-related macular degeneration of left eye with active choroidal neovascularization (HCC)  H35.3221 OCT, Retina - OU - Both Eyes    Intravitreal Injection, Pharmacologic Agent - OS - Left Eye    bevacizumab (AVASTIN) SOSY 2.5 mg    2. Cystoid macular edema of left eye  H35.352     3. Advanced nonexudative age-related macular degeneration of both eyes without subfoveal involvement  H35.3133       1.  OS vastly improved today as compared to 12-week interval, today at 6-week interval post Avastin.  Repeat injection today maintain in order to preserve acuity  2.  3.  Ophthalmic Meds Ordered this visit:  Meds ordered this encounter  Medications   bevacizumab (AVASTIN) SOSY 2.5 mg       Return in about 6 weeks (around  11/03/2021) for dilate, OS, AVASTIN OCT.  There are no Patient Instructions on file for this visit.   Explained the diagnoses, plan, and follow up with the patient and they expressed understanding.  Patient expressed understanding of the importance of proper follow up care.   Clent Demark Justinian Miano M.D. Diseases & Surgery of the Retina and Vitreous Retina & Diabetic [place] 09/22/21     Abbreviations: M myopia (nearsighted); A astigmatism; H hyperopia (farsighted); P presbyopia; Mrx spectacle prescription;  CTL contact lenses; OD right eye; OS left eye; OU both eyes  XT exotropia; ET esotropia; PEK punctate epithelial keratitis; PEE punctate epithelial erosions; DES dry eye syndrome; MGD meibomian gland dysfunction; ATs artificial tears; PFAT's preservative free artificial tears; [place] nuclear sclerotic cataract; PSC posterior subcapsular cataract; ERM epi-retinal membrane; PVD posterior vitreous detachment; RD retinal detachment; DM diabetes mellitus; DR diabetic retinopathy; NPDR non-proliferative diabetic retinopathy; PDR proliferative diabetic retinopathy; CSME clinically significant macular edema; DME diabetic macular edema; dbh dot blot hemorrhages; CWS cotton wool spot; POAG primary open angle glaucoma; C/D cup-to-disc ratio; HVF humphrey visual field; GVF goldmann visual field; OCT optical coherence tomography; IOP intraocular pressure; BRVO Branch retinal vein occlusion; CRVO central retinal vein occlusion; CRAO central retinal artery occlusion; BRAO branch retinal artery occlusion; RT retinal tear; SB scleral buckle; PPV pars plana vitrectomy; VH Vitreous hemorrhage; PRP panretinal laser photocoagulation; IVK intravitreal kenalog; VMT vitreomacular traction; MH Macular hole;  NVD neovascularization of the disc; NVE neovascularization elsewhere; AREDS age related eye disease study; ARMD age related macular degeneration; POAG primary open angle glaucoma; EBMD epithelial/anterior basement membrane dystrophy; ACIOL anterior chamber intraocular lens; IOL intraocular lens; PCIOL posterior chamber intraocular lens; Phaco/IOL phacoemulsification with intraocular lens placement; [place] photorefractive keratectomy; LASIK laser assisted in situ keratomileusis; HTN hypertension; DM diabetes mellitus; COPD chronic obstructive pulmonary disease

## 2021-09-22 NOTE — Assessment & Plan Note (Signed)
Compounded wet AMD improved at 6 weeks post Avastin

## 2021-09-30 DIAGNOSIS — R7303 Prediabetes: Secondary | ICD-10-CM | POA: Diagnosis not present

## 2021-09-30 DIAGNOSIS — E782 Mixed hyperlipidemia: Secondary | ICD-10-CM | POA: Diagnosis not present

## 2021-09-30 DIAGNOSIS — E039 Hypothyroidism, unspecified: Secondary | ICD-10-CM | POA: Diagnosis not present

## 2021-10-07 DIAGNOSIS — I509 Heart failure, unspecified: Secondary | ICD-10-CM | POA: Diagnosis not present

## 2021-10-07 DIAGNOSIS — I4891 Unspecified atrial fibrillation: Secondary | ICD-10-CM | POA: Diagnosis not present

## 2021-10-07 DIAGNOSIS — J9601 Acute respiratory failure with hypoxia: Secondary | ICD-10-CM | POA: Diagnosis not present

## 2021-10-07 DIAGNOSIS — E782 Mixed hyperlipidemia: Secondary | ICD-10-CM | POA: Diagnosis not present

## 2021-10-07 DIAGNOSIS — R748 Abnormal levels of other serum enzymes: Secondary | ICD-10-CM | POA: Diagnosis not present

## 2021-10-07 DIAGNOSIS — E039 Hypothyroidism, unspecified: Secondary | ICD-10-CM | POA: Diagnosis not present

## 2021-10-07 DIAGNOSIS — E859 Amyloidosis, unspecified: Secondary | ICD-10-CM | POA: Diagnosis not present

## 2021-10-07 DIAGNOSIS — Z0001 Encounter for general adult medical examination with abnormal findings: Secondary | ICD-10-CM | POA: Diagnosis not present

## 2021-10-07 DIAGNOSIS — G894 Chronic pain syndrome: Secondary | ICD-10-CM | POA: Diagnosis not present

## 2021-10-07 DIAGNOSIS — T148XXD Other injury of unspecified body region, subsequent encounter: Secondary | ICD-10-CM | POA: Diagnosis not present

## 2021-10-07 DIAGNOSIS — R7303 Prediabetes: Secondary | ICD-10-CM | POA: Diagnosis not present

## 2021-10-13 ENCOUNTER — Ambulatory Visit (INDEPENDENT_AMBULATORY_CARE_PROVIDER_SITE_OTHER): Payer: Medicare Other | Admitting: Pulmonary Disease

## 2021-10-13 ENCOUNTER — Encounter: Payer: Self-pay | Admitting: Pulmonary Disease

## 2021-10-13 DIAGNOSIS — I5032 Chronic diastolic (congestive) heart failure: Secondary | ICD-10-CM

## 2021-10-13 DIAGNOSIS — J849 Interstitial pulmonary disease, unspecified: Secondary | ICD-10-CM | POA: Diagnosis not present

## 2021-10-30 DIAGNOSIS — Z950 Presence of cardiac pacemaker: Secondary | ICD-10-CM | POA: Diagnosis not present

## 2021-11-03 ENCOUNTER — Encounter (INDEPENDENT_AMBULATORY_CARE_PROVIDER_SITE_OTHER): Payer: Self-pay | Admitting: Ophthalmology

## 2021-11-03 ENCOUNTER — Ambulatory Visit (INDEPENDENT_AMBULATORY_CARE_PROVIDER_SITE_OTHER): Payer: Medicare Other | Admitting: Ophthalmology

## 2021-11-03 DIAGNOSIS — H353133 Nonexudative age-related macular degeneration, bilateral, advanced atrophic without subfoveal involvement: Secondary | ICD-10-CM

## 2021-11-03 DIAGNOSIS — H43812 Vitreous degeneration, left eye: Secondary | ICD-10-CM

## 2021-11-03 DIAGNOSIS — H353221 Exudative age-related macular degeneration, left eye, with active choroidal neovascularization: Secondary | ICD-10-CM | POA: Diagnosis not present

## 2021-11-03 MED ORDER — BEVACIZUMAB 2.5 MG/0.1ML IZ SOSY
2.5000 mg | PREFILLED_SYRINGE | INTRAVITREAL | Status: AC | PRN
  Administered 2021-11-03: 2.5 mg via INTRAVITREAL

## 2021-11-03 NOTE — Assessment & Plan Note (Addendum)
Today stable again at 6-week interval.  We will repeat injection OS today to maintain and reevaluate next in 8 weeks

## 2021-11-03 NOTE — Progress Notes (Signed)
11/03/2021     CHIEF COMPLAINT Patient presents for  Chief Complaint  Patient presents with   Macular Degeneration      HISTORY OF PRESENT ILLNESS: Cindy [name] is a 88 y.o. female who presents to the clinic today for:   HPI   6 weeks for DILATE, OS, AVASTIN, OCT. Pt stated, "My left eye was red but it wasn't itchy. I couldn't see out of my left eye for about a week but the redness cleared up. The magnifying glasses doesn't help. " Pt stated new symptoms started about a week ago.   Last edited by Silvestre Moment on 11/03/2021 10:37 AM.      Referring physician: Celene Squibb, MD [place],  [place] [id number]  HISTORICAL INFORMATION:   Selected notes from the MEDICAL RECORD NUMBER    Lab Results  Component Value Date   HGBA1C 6.1 (H) 03/14/2013     CURRENT MEDICATIONS: Current Outpatient Medications (Ophthalmic Drugs)  Medication Sig   Polyethyl Glycol-Propyl Glycol (SYSTANE OP) Apply 1 drop to eye daily.   No current facility-administered medications for this visit. (Ophthalmic Drugs)   Current Outpatient Medications (Other)  Medication Sig   acetaminophen (TYLENOL) 500 MG tablet Take 1,000 mg by mouth as needed.   ALPRAZolam (XANAX) 0.5 MG tablet Take 1 tablet (0.5 mg total) by mouth at bedtime as needed for anxiety.   apixaban (ELIQUIS) 5 MG TABS tablet Take 5 mg by mouth 2 (two) times daily.   diclofenac Sodium (VOLTAREN) 1 % GEL Apply topically.   fentaNYL (DURAGESIC) 25 MCG/HR Place 1 patch onto the skin every 3 (three) days.   ipratropium-albuterol (DUONEB) 0.5-2.5 (3) MG/3ML SOLN Take 3 mLs by nebulization every 4 (four) hours as needed (Shortness of breath and wheezing).   levothyroxine (SYNTHROID) 112 MCG tablet Take 112 mcg by mouth daily.   metoprolol succinate (TOPROL-XL) 25 MG 24 hr tablet Take 0.5-1 tablets (12.5-25 mg total) by mouth in the morning and at bedtime. Patient takes 1 tablet (1m) in the morning and 1/2 of a tablet (12.535m  in the evening (Patient taking differently: Take 25 mg by mouth daily.)   ondansetron (ZOFRAN) 4 MG tablet Take 4 mg by mouth every 8 (eight) hours as needed.   potassium chloride SA (KLOR-CON) 20 MEQ tablet Take 10 mEq by mouth See admin instructions. Take 10 meq 4 times a week   senna (SENOKOT) 8.6 MG tablet Take 2 tablets by mouth as needed for constipation.   sodium chloride (OCEAN) 0.65 % nasal spray Place 2 sprays into the nose daily as needed.    torsemide (DEMADEX) 20 MG tablet Take 20 mg by mouth daily.   No current facility-administered medications for this visit. (Other)      REVIEW OF SYSTEMS: ROS   Negative for: Constitutional, Gastrointestinal, Neurological, Skin, Genitourinary, Musculoskeletal, HENT, Endocrine, Cardiovascular, Eyes, Respiratory, Psychiatric, Allergic/Imm, Heme/Lymph Last edited by MaSilvestre Momentn 11/03/2021 10:37 AM.       ALLERGIES Allergies  Allergen Reactions   Nebivolol Hcl     Other reaction(s): Other (See Comments) Chest tightness,sob,cough,severe tiredness,confusion   Codeine Nausea Only   Sulfa Antibiotics Nausea Only   Sulfonamide Derivatives Nausea Only    PAST MEDICAL HISTORY Past Medical History:  Diagnosis Date   Cancer ([place]   CHF (congestive heart failure) (HCC)    Amyloidosis   Diarrhea    Diverticulosis of colon (without mention of hemorrhage)    Edema  Esophagitis, unspecified    Family history of malignant neoplasm of gastrointestinal tract    Fibrosis of lung ([place]) 03/14/2013   Flatulence, eructation, and gas pain    GERD (gastroesophageal reflux disease)    Left sided ulcerative colitis ([place])    Maxillary sinus mass    Osteoporosis, unspecified    Other and unspecified hyperlipidemia    Other and unspecified noninfectious gastroenteritis and colitis(558.9)    Paroxysmal atrial fibrillation ([place]) 07/09/2019   Stricture and stenosis of esophagus    Unspecified hypothyroidism    URI (upper respiratory infection)     Past Surgical History:  Procedure Laterality Date   ABDOMINAL HYSTERECTOMY     CATARACT EXTRACTION W/PHACO Bilateral    Dr. Gershon Crane   CHOLECYSTECTOMY     FOOT SURGERY     THYROIDECTOMY, PARTIAL     YAG LASER APPLICATION Right 0/35/5974   Procedure: YAG LASER APPLICATION;  Surgeon: Elta Guadeloupe T. Gershon Crane, MD;  Location: [facility];  Service: Ophthalmology;  Laterality: Right;    FAMILY HISTORY Family History  Problem Relation Age of Onset   Colon cancer Mother    Heart attack Father        CHF   Stomach cancer Sister    Diabetes Son    Kidney cancer Son    Adrenal disorder Son    Heart disease Brother     SOCIAL HISTORY Social History   Tobacco Use   Smoking status: Never   Smokeless tobacco: Never  Vaping Use   Vaping Use: Never used  Substance Use Topics   Alcohol use: No    Alcohol/week: 0.0 standard drinks of alcohol   Drug use: No         OPHTHALMIC EXAM:  Base Eye Exam     Visual Acuity     ETDRS         Tonometry (Tonopen, 10:40 AM)       Right Left   Pressure 15 13         Pupils       Pupils APD   Right PERRL None   Left PERRL None         Visual Fields       Left Right    Full Full         Extraocular Movement       Right Left    Full, Ortho Full, Ortho         Neuro/Psych     Oriented x3: Yes   Mood/Affect: Normal         Dilation     Left eye: 2.5% Phenylephrine, 1.0% Mydriacyl @ 10:40 AM           Slit Lamp and Fundus Exam     External Exam       Right Left   External Normal Normal         Slit Lamp Exam       Right Left   Lids/Lashes Normal Normal   Conjunctiva/Sclera White and quiet White and quiet   Cornea Clear Clear   Anterior Chamber Deep and quiet Deep and quiet   Iris Round and reactive Round and reactive   Lens  Posterior chamber intraocular lens   Anterior Vitreous Normal Normal         Fundus Exam       Right Left   Posterior Vitreous  ,,, Posterior vitreous detachment    Disc  Normal   C/D Ratio  0.1   Macula  Atrophy, Age related macular degeneration, Early age related macular degeneration, Drusen, new macular thickening, dry atrophic change in the macula   Vessels  Normal   Periphery  Normal            IMAGING AND PROCEDURES  Imaging and Procedures for 11/03/21  OCT, Retina - OU - Both Eyes       Right Eye Quality was good. Scan locations included subfoveal. Central Foveal Thickness: 312. Progression has been stable. Findings include abnormal foveal contour, retinal drusen .   Left Eye Quality was good. Scan locations included subfoveal. Central Foveal Thickness: 264. Progression has improved. Findings include abnormal foveal contour, retinal drusen .   Notes OS vastly improved as compared to onset of CNVM at 6 week interval.  We will need to repeat injection Avastin and follow-up next in 8 weeks to regain control of macular disease left eye     Intravitreal Injection, Pharmacologic Agent - OS - Left Eye       Time Out 11/03/2021. 11:10 AM. Confirmed correct patient, procedure, site, and patient consented.   Anesthesia Topical anesthesia was used. Anesthetic medications included Lidocaine 4%.   Procedure Preparation included 5% betadine to ocular surface, 10% betadine to eyelids, Tobramycin 0.3%. A 30 gauge needle was used.   Injection: 2.5 mg bevacizumab 2.5 MG/0.1ML   Route: Intravitreal, Site: Left Eye   NDC: [phone], Lot: [id number], Expiration date: 12/19/2021   Post-op Post injection exam found visual acuity of at least counting fingers. The patient tolerated the procedure well. There were no complications. The patient received written and verbal post procedure care education. Post injection medications included ocuflox.              ASSESSMENT/PLAN:  Exudative age-related macular degeneration of left eye with active choroidal neovascularization (HCC) Today stable again at 6-week interval.  We will repeat injection OS  today to maintain and reevaluate next in 8 weeks  Advanced nonexudative age-related macular degeneration of both eyes without subfoveal involvement No sign of CNVM OD  Posterior vitreous detachment of left eye Physiologic OS     ICD-10-CM   1. Exudative age-related macular degeneration of left eye with active choroidal neovascularization (HCC)  H35.3221 OCT, Retina - OU - Both Eyes    Intravitreal Injection, Pharmacologic Agent - OS - Left Eye    bevacizumab (AVASTIN) SOSY 2.5 mg    2. Advanced nonexudative age-related macular degeneration of both eyes without subfoveal involvement  H35.3133     3. Posterior vitreous detachment of left eye  H43.812       1.  OS doing well and improved macular findings now at 6-week interval.  Post Avastin.  Repeat injection today to maintain.  Acuity limited by atrophy.  2.  3.  Ophthalmic Meds Ordered this visit:  Meds ordered this encounter  Medications   bevacizumab (AVASTIN) SOSY 2.5 mg       Return in about 8 weeks (around 12/29/2021) for dilate, OS, AVASTIN OCT.  There are no Patient Instructions on file for this visit.   Explained the diagnoses, plan, and follow up with the patient and they expressed understanding.  Patient expressed understanding of the importance of proper follow up care.   Clent Demark Debbe Crumble M.D. Diseases & Surgery of the Retina and Vitreous Retina & Diabetic [place] 11/03/21     Abbreviations: M myopia (nearsighted); A astigmatism; H hyperopia (farsighted); P presbyopia; Mrx spectacle prescription;  CTL contact lenses; OD right eye; OS left eye; OU both  eyes  XT exotropia; ET esotropia; PEK punctate epithelial keratitis; PEE punctate epithelial erosions; DES dry eye syndrome; MGD meibomian gland dysfunction; ATs artificial tears; PFAT's preservative free artificial tears; [place] nuclear sclerotic cataract; PSC posterior subcapsular cataract; ERM epi-retinal membrane; PVD posterior vitreous detachment; RD retinal  detachment; DM diabetes mellitus; DR diabetic retinopathy; NPDR non-proliferative diabetic retinopathy; PDR proliferative diabetic retinopathy; CSME clinically significant macular edema; DME diabetic macular edema; dbh dot blot hemorrhages; CWS cotton wool spot; POAG primary open angle glaucoma; C/D cup-to-disc ratio; HVF humphrey visual field; GVF goldmann visual field; OCT optical coherence tomography; IOP intraocular pressure; BRVO Branch retinal vein occlusion; CRVO central retinal vein occlusion; CRAO central retinal artery occlusion; BRAO branch retinal artery occlusion; RT retinal tear; SB scleral buckle; PPV pars plana vitrectomy; VH Vitreous hemorrhage; PRP panretinal laser photocoagulation; IVK intravitreal kenalog; VMT vitreomacular traction; MH Macular hole;  NVD neovascularization of the disc; NVE neovascularization elsewhere; AREDS age related eye disease study; ARMD age related macular degeneration; POAG primary open angle glaucoma; EBMD epithelial/anterior basement membrane dystrophy; ACIOL anterior chamber intraocular lens; IOL intraocular lens; PCIOL posterior chamber intraocular lens; Phaco/IOL phacoemulsification with intraocular lens placement; [place] photorefractive keratectomy; LASIK laser assisted in situ keratomileusis; HTN hypertension; DM diabetes mellitus; COPD chronic obstructive pulmonary disease

## 2021-11-03 NOTE — Assessment & Plan Note (Signed)
Physiologic OS

## 2021-11-03 NOTE — Assessment & Plan Note (Signed)
No sign of CNVM OD

## 2021-12-03 DIAGNOSIS — I890 Lymphedema, not elsewhere classified: Secondary | ICD-10-CM | POA: Diagnosis not present

## 2021-12-08 ENCOUNTER — Ambulatory Visit (HOSPITAL_COMMUNITY)
Admission: RE | Admit: 2021-12-08 | Discharge: 2021-12-08 | Disposition: A | Discharge status: Home / Self Care | Payer: Medicare Other | Source: Ambulatory Visit | Attending: Pulmonary Disease | Admitting: Pulmonary Disease

## 2021-12-08 DIAGNOSIS — J929 Pleural plaque without asbestos: Secondary | ICD-10-CM | POA: Diagnosis not present

## 2021-12-08 DIAGNOSIS — R0602 Shortness of breath: Secondary | ICD-10-CM | POA: Insufficient documentation

## 2021-12-08 DIAGNOSIS — J90 Pleural effusion, not elsewhere classified: Secondary | ICD-10-CM | POA: Insufficient documentation

## 2021-12-08 DIAGNOSIS — I272 Pulmonary hypertension, unspecified: Secondary | ICD-10-CM | POA: Insufficient documentation

## 2021-12-08 DIAGNOSIS — R600 Localized edema: Secondary | ICD-10-CM | POA: Insufficient documentation

## 2021-12-08 DIAGNOSIS — I700 Atherosclerosis of aorta: Secondary | ICD-10-CM | POA: Diagnosis not present

## 2021-12-08 DIAGNOSIS — R0600 Dyspnea, unspecified: Secondary | ICD-10-CM | POA: Diagnosis not present

## 2021-12-08 DIAGNOSIS — I2510 Atherosclerotic heart disease of native coronary artery without angina pectoris: Secondary | ICD-10-CM | POA: Diagnosis not present

## 2021-12-09 DIAGNOSIS — I890 Lymphedema, not elsewhere classified: Secondary | ICD-10-CM | POA: Diagnosis not present

## 2021-12-11 DIAGNOSIS — I890 Lymphedema, not elsewhere classified: Secondary | ICD-10-CM | POA: Diagnosis not present

## 2021-12-15 DIAGNOSIS — I890 Lymphedema, not elsewhere classified: Secondary | ICD-10-CM | POA: Diagnosis not present

## 2021-12-16 DIAGNOSIS — J9601 Acute respiratory failure with hypoxia: Secondary | ICD-10-CM | POA: Diagnosis not present

## 2021-12-16 DIAGNOSIS — I4891 Unspecified atrial fibrillation: Secondary | ICD-10-CM | POA: Diagnosis not present

## 2021-12-16 DIAGNOSIS — R194 Change in bowel habit: Secondary | ICD-10-CM | POA: Diagnosis not present

## 2021-12-16 DIAGNOSIS — J849 Interstitial pulmonary disease, unspecified: Secondary | ICD-10-CM | POA: Diagnosis not present

## 2021-12-16 DIAGNOSIS — R600 Localized edema: Secondary | ICD-10-CM | POA: Diagnosis not present

## 2021-12-16 DIAGNOSIS — Z681 Body mass index (BMI) 19 or less, adult: Secondary | ICD-10-CM | POA: Diagnosis not present

## 2021-12-16 DIAGNOSIS — I509 Heart failure, unspecified: Secondary | ICD-10-CM | POA: Diagnosis not present

## 2021-12-17 DIAGNOSIS — I890 Lymphedema, not elsewhere classified: Secondary | ICD-10-CM | POA: Diagnosis not present

## 2021-12-23 DIAGNOSIS — I890 Lymphedema, not elsewhere classified: Secondary | ICD-10-CM | POA: Diagnosis not present

## 2021-12-25 DIAGNOSIS — I890 Lymphedema, not elsewhere classified: Secondary | ICD-10-CM | POA: Diagnosis not present

## 2021-12-29 ENCOUNTER — Ambulatory Visit (INDEPENDENT_AMBULATORY_CARE_PROVIDER_SITE_OTHER): Payer: Medicare Other | Admitting: Ophthalmology

## 2021-12-29 ENCOUNTER — Encounter (INDEPENDENT_AMBULATORY_CARE_PROVIDER_SITE_OTHER): Payer: Self-pay | Admitting: Ophthalmology

## 2021-12-29 ENCOUNTER — Encounter (INDEPENDENT_AMBULATORY_CARE_PROVIDER_SITE_OTHER): Payer: Medicare Other | Admitting: Ophthalmology

## 2021-12-29 DIAGNOSIS — H353221 Exudative age-related macular degeneration, left eye, with active choroidal neovascularization: Secondary | ICD-10-CM | POA: Diagnosis not present

## 2021-12-29 DIAGNOSIS — H353133 Nonexudative age-related macular degeneration, bilateral, advanced atrophic without subfoveal involvement: Secondary | ICD-10-CM | POA: Diagnosis not present

## 2021-12-29 DIAGNOSIS — H43812 Vitreous degeneration, left eye: Secondary | ICD-10-CM | POA: Diagnosis not present

## 2021-12-29 MED ORDER — BEVACIZUMAB CHEMO INJECTION 1.25MG/0.05ML SYRINGE FOR KALEIDOSCOPE
1.2500 mg | INTRAVITREAL | Status: AC | PRN
  Administered 2021-12-29: 1.25 mg via INTRAVITREAL

## 2021-12-29 NOTE — Assessment & Plan Note (Signed)
OS much less active CNVM disease maintained today at 8-week follow-up interval.  Subfoveal scarring accounts for acuity.

## 2021-12-29 NOTE — Assessment & Plan Note (Signed)
No sign of CNVM OU.  Foveal drusen OU accounts for acuity with atrophy present OS

## 2021-12-29 NOTE — Assessment & Plan Note (Signed)
Stable OS no holes or tears

## 2021-12-29 NOTE — Progress Notes (Signed)
12/29/2021     CHIEF COMPLAINT Patient presents for No chief complaint on file.     HISTORY OF PRESENT ILLNESS: Cindy [name] is a 88 y.o. female who presents to the clinic today for:   HPI   8 weeks dilate os avastin oct Pt states her vision ha been stable Pt denies any new floaters or FOL  Last edited by Morene Rankins, CMA on 12/29/2021 11:06 AM.      Referring physician: Celene Squibb, MD [address],  [place] [address]  HISTORICAL INFORMATION:   Selected notes from the MEDICAL RECORD NUMBER    Lab Results  Component Value Date   HGBA1C 6.1 (H) 03/14/2013     CURRENT MEDICATIONS: Current Outpatient Medications (Ophthalmic Drugs)  Medication Sig   Polyethyl Glycol-Propyl Glycol (SYSTANE OP) Apply 1 drop to eye daily.   No current facility-administered medications for this visit. (Ophthalmic Drugs)   Current Outpatient Medications (Other)  Medication Sig   acetaminophen (TYLENOL) 500 MG tablet Take 1,000 mg by mouth as needed.   ALPRAZolam (XANAX) 0.5 MG tablet Take 1 tablet (0.5 mg total) by mouth at bedtime as needed for anxiety.   apixaban (ELIQUIS) 5 MG TABS tablet Take 5 mg by mouth 2 (two) times daily.   diclofenac Sodium (VOLTAREN) 1 % GEL Apply topically.   fentaNYL (DURAGESIC) 25 MCG/HR Place 1 patch onto the skin every 3 (three) days.   ipratropium-albuterol (DUONEB) 0.5-2.5 (3) MG/3ML SOLN Take 3 mLs by nebulization every 4 (four) hours as needed (Shortness of breath and wheezing).   levothyroxine (SYNTHROID) 112 MCG tablet Take 112 mcg by mouth daily.   metoprolol succinate (TOPROL-XL) 25 MG 24 hr tablet Take 0.5-1 tablets (12.5-25 mg total) by mouth in the morning and at bedtime. Patient takes 1 tablet (67m) in the morning and 1/2 of a tablet (12.557m in the evening (Patient taking differently: Take 25 mg by mouth daily.)   ondansetron (ZOFRAN) 4 MG tablet Take 4 mg by mouth every 8 (eight) hours as needed.   potassium chloride SA  (KLOR-CON) 20 MEQ tablet Take 10 mEq by mouth See admin instructions. Take 10 meq 4 times a week   senna (SENOKOT) 8.6 MG tablet Take 2 tablets by mouth as needed for constipation.   sodium chloride (OCEAN) 0.65 % nasal spray Place 2 sprays into the nose daily as needed.    torsemide (DEMADEX) 20 MG tablet Take 20 mg by mouth daily.   No current facility-administered medications for this visit. (Other)      REVIEW OF SYSTEMS: ROS   Negative for: Constitutional, Gastrointestinal, Neurological, Skin, Genitourinary, Musculoskeletal, HENT, Endocrine, Cardiovascular, Eyes, Respiratory, Psychiatric, Allergic/Imm, Heme/Lymph Last edited by PaMorene RankinsCMA on 12/29/2021 11:06 AM.       ALLERGIES Allergies  Allergen Reactions   Nebivolol Hcl     Other reaction(s): Other (See Comments) Chest tightness,sob,cough,severe tiredness,confusion   Codeine Nausea Only   Sulfa Antibiotics Nausea Only   Sulfonamide Derivatives Nausea Only    PAST MEDICAL HISTORY Past Medical History:  Diagnosis Date   Cancer ([facility]   CHF (congestive heart failure) (HCC)    Amyloidosis   Diarrhea    Diverticulosis of colon (without mention of hemorrhage)    Edema    Esophagitis, unspecified    Family history of malignant neoplasm of gastrointestinal tract    Fibrosis of lung ([facility]11/25/2014   Flatulence, eructation, and gas pain    GERD (gastroesophageal  reflux disease)    Left sided ulcerative colitis ([place])    Maxillary sinus mass    Osteoporosis, unspecified    Other and unspecified hyperlipidemia    Other and unspecified noninfectious gastroenteritis and colitis(558.9)    Paroxysmal atrial fibrillation ([place]) 07/09/2019   Stricture and stenosis of esophagus    Unspecified hypothyroidism    URI (upper respiratory infection)    Past Surgical History:  Procedure Laterality Date   ABDOMINAL HYSTERECTOMY     CATARACT EXTRACTION W/PHACO Bilateral    Dr. Gershon Crane   CHOLECYSTECTOMY     FOOT  SURGERY     THYROIDECTOMY, PARTIAL     YAG LASER APPLICATION Right 05/08/4172   Procedure: YAG LASER APPLICATION;  Surgeon: Elta Guadeloupe T. Gershon Crane, MD;  Location: [facility];  Service: Ophthalmology;  Laterality: Right;    FAMILY HISTORY Family History  Problem Relation Age of Onset   Colon cancer Mother    Heart attack Father        CHF   Stomach cancer Sister    Diabetes Son    Kidney cancer Son    Adrenal disorder Son    Heart disease Brother     SOCIAL HISTORY Social History   Tobacco Use   Smoking status: Never   Smokeless tobacco: Never  Vaping Use   Vaping Use: Never used  Substance Use Topics   Alcohol use: No    Alcohol/week: 0.0 standard drinks of alcohol   Drug use: No         OPHTHALMIC EXAM:  Base Eye Exam     Visual Acuity (ETDRS)       Right Left   Dist [place] 20/50 20/400  Pt today is seeing 20/400 Last visit she was seeing 20/80 Dr Zadie Rhine is aware        Tonometry (Tonopen, 11:12 AM)       Right Left   Pressure 5 10         Pupils       Pupils APD   Right PERRL None   Left PERRL None         Neuro/Psych     Oriented x3: Yes   Mood/Affect: Normal         Dilation     Left eye: 2.5% Phenylephrine, 1.0% Mydriacyl @ 11:07 AM           Slit Lamp and Fundus Exam     External Exam       Right Left   External Normal Normal         Slit Lamp Exam       Right Left   Lids/Lashes Normal Normal   Conjunctiva/Sclera White and quiet White and quiet   Cornea Clear Clear   Anterior Chamber Deep and quiet Deep and quiet   Iris Round and reactive Round and reactive   Lens  Posterior chamber intraocular lens   Anterior Vitreous Normal Normal         Fundus Exam       Right Left   Posterior Vitreous  ,,, Posterior vitreous detachment   Disc  Normal   C/D Ratio  0.1   Macula  Atrophy, Age related macular degeneration, Early age related macular degeneration, Drusen, new macular thickening, dry atrophic change in the macula,  Retinal pigment epithelial mottling   Vessels  Normal   Periphery  Normal            IMAGING AND PROCEDURES  Imaging and Procedures for 12/29/21  OCT, Retina - OU - Both Eyes       Right Eye Quality was good. Scan locations included subfoveal. Central Foveal Thickness: 313. Progression has been stable. Findings include abnormal foveal contour, retinal drusen .   Left Eye Quality was good. Scan locations included subfoveal. Central Foveal Thickness: 270. Progression has improved. Findings include abnormal foveal contour, retinal drusen .   Notes OS vastly improved as compared to onset of CNVM at 8 week interval.  We will need to repeat injection Avastin and follow-up next in 10 weeks to regain control of macular disease left eye      Intravitreal Injection, Pharmacologic Agent - OS - Left Eye       Time Out 12/29/2021. 12:01 PM. Confirmed correct patient, procedure, site, and patient consented.   Anesthesia Topical anesthesia was used. Anesthetic medications included Lidocaine 4%.   Procedure Preparation included 5% betadine to ocular surface, 10% betadine to eyelids, Tobramycin 0.3%. A 30 gauge needle was used.   Injection: 1.25 mg Bevacizumab 1.44m/0.05ml   Route: Intravitreal, Site: Left Eye   NDC: [id number] Lot: [id number] Expiration date: 03/04/2022   Post-op Post injection exam found visual acuity of at least counting fingers. The patient tolerated the procedure well. There were no complications. The patient received written and verbal post procedure care education. Post injection medications included ocuflox.              ASSESSMENT/PLAN:  Exudative age-related macular degeneration of left eye with active choroidal neovascularization (HCC) OS much less active CNVM disease maintained today at 8-week follow-up interval.  Subfoveal scarring accounts for acuity.  Posterior vitreous detachment of left eye Stable OS no holes or tears  Advanced  nonexudative age-related macular degeneration of both eyes without subfoveal involvement No sign of CNVM OU.  Foveal drusen OU accounts for acuity with atrophy present OS     ICD-10-CM   1. Exudative age-related macular degeneration of left eye with active choroidal neovascularization (HCC)  H35.3221 OCT, Retina - OU - Both Eyes    Intravitreal Injection, Pharmacologic Agent - OS - Left Eye    Bevacizumab (AVASTIN) SOLN 1.25 mg    2. Posterior vitreous detachment of left eye  H43.812     3. Advanced nonexudative age-related macular degeneration of both eyes without subfoveal involvement  H35.3133       1.  2.  3.  Ophthalmic Meds Ordered this visit:  Meds ordered this encounter  Medications   Bevacizumab (AVASTIN) SOLN 1.25 mg       Return in about 10 weeks (around 03/09/2022) for DILATE OU, AVASTIN OCT, OS.  There are no Patient Instructions on file for this visit.   Explained the diagnoses, plan, and follow up with the patient and they expressed understanding.  Patient expressed understanding of the importance of proper follow up care.   GClent DemarkRankin M.D. Diseases & Surgery of the Retina and Vitreous Retina & Diabetic ERidgeville Corners09/11/23     Abbreviations: M myopia (nearsighted); A astigmatism; H hyperopia (farsighted); P presbyopia; Mrx spectacle prescription;  CTL contact lenses; OD right eye; OS left eye; OU both eyes  XT exotropia; ET esotropia; PEK punctate epithelial keratitis; PEE punctate epithelial erosions; DES dry eye syndrome; MGD meibomian gland dysfunction; ATs artificial tears; PFAT's preservative free artificial tears; NBranchnuclear sclerotic cataract; PSC posterior subcapsular cataract; ERM epi-retinal membrane; PVD posterior vitreous detachment; RD retinal detachment; DM diabetes mellitus; DR diabetic retinopathy; NPDR non-proliferative diabetic retinopathy; PDR proliferative diabetic retinopathy; CSME clinically  significant macular edema; DME diabetic  macular edema; dbh dot blot hemorrhages; CWS cotton wool spot; POAG primary open angle glaucoma; C/D cup-to-disc ratio; HVF humphrey visual field; GVF goldmann visual field; OCT optical coherence tomography; IOP intraocular pressure; BRVO Branch retinal vein occlusion; CRVO central retinal vein occlusion; CRAO central retinal artery occlusion; BRAO branch retinal artery occlusion; RT retinal tear; SB scleral buckle; PPV pars plana vitrectomy; VH Vitreous hemorrhage; PRP panretinal laser photocoagulation; IVK intravitreal kenalog; VMT vitreomacular traction; MH Macular hole;  NVD neovascularization of the disc; NVE neovascularization elsewhere; AREDS age related eye disease study; ARMD age related macular degeneration; POAG primary open angle glaucoma; EBMD epithelial/anterior basement membrane dystrophy; ACIOL anterior chamber intraocular lens; IOL intraocular lens; PCIOL posterior chamber intraocular lens; Phaco/IOL phacoemulsification with intraocular lens placement; [place] photorefractive keratectomy; LASIK laser assisted in situ keratomileusis; HTN hypertension; DM diabetes mellitus; COPD chronic obstructive pulmonary disease

## 2021-12-30 DIAGNOSIS — I890 Lymphedema, not elsewhere classified: Secondary | ICD-10-CM | POA: Diagnosis not present

## 2022-01-01 DIAGNOSIS — I890 Lymphedema, not elsewhere classified: Secondary | ICD-10-CM | POA: Diagnosis not present

## 2022-01-05 DIAGNOSIS — I890 Lymphedema, not elsewhere classified: Secondary | ICD-10-CM | POA: Diagnosis not present

## 2022-01-07 DIAGNOSIS — I890 Lymphedema, not elsewhere classified: Secondary | ICD-10-CM | POA: Diagnosis not present

## 2022-01-08 DIAGNOSIS — H353221 Exudative age-related macular degeneration, left eye, with active choroidal neovascularization: Secondary | ICD-10-CM | POA: Diagnosis not present

## 2022-01-08 DIAGNOSIS — E782 Mixed hyperlipidemia: Secondary | ICD-10-CM | POA: Diagnosis not present

## 2022-01-08 DIAGNOSIS — H353133 Nonexudative age-related macular degeneration, bilateral, advanced atrophic without subfoveal involvement: Secondary | ICD-10-CM | POA: Diagnosis not present

## 2022-01-08 DIAGNOSIS — Z961 Presence of intraocular lens: Secondary | ICD-10-CM | POA: Diagnosis not present

## 2022-01-08 DIAGNOSIS — R7303 Prediabetes: Secondary | ICD-10-CM | POA: Diagnosis not present

## 2022-01-08 DIAGNOSIS — E039 Hypothyroidism, unspecified: Secondary | ICD-10-CM | POA: Diagnosis not present

## 2022-01-12 ENCOUNTER — Encounter: Payer: Self-pay | Admitting: Internal Medicine

## 2022-01-12 DIAGNOSIS — I890 Lymphedema, not elsewhere classified: Secondary | ICD-10-CM | POA: Diagnosis not present

## 2022-01-14 DIAGNOSIS — I890 Lymphedema, not elsewhere classified: Secondary | ICD-10-CM | POA: Diagnosis not present

## 2022-01-15 DIAGNOSIS — R7303 Prediabetes: Secondary | ICD-10-CM | POA: Diagnosis not present

## 2022-01-15 DIAGNOSIS — E859 Amyloidosis, unspecified: Secondary | ICD-10-CM | POA: Diagnosis not present

## 2022-01-15 DIAGNOSIS — T148XXD Other injury of unspecified body region, subsequent encounter: Secondary | ICD-10-CM | POA: Diagnosis not present

## 2022-01-15 DIAGNOSIS — J9601 Acute respiratory failure with hypoxia: Secondary | ICD-10-CM | POA: Diagnosis not present

## 2022-01-15 DIAGNOSIS — G894 Chronic pain syndrome: Secondary | ICD-10-CM | POA: Diagnosis not present

## 2022-01-15 DIAGNOSIS — E039 Hypothyroidism, unspecified: Secondary | ICD-10-CM | POA: Diagnosis not present

## 2022-01-15 DIAGNOSIS — E782 Mixed hyperlipidemia: Secondary | ICD-10-CM | POA: Diagnosis not present

## 2022-01-15 DIAGNOSIS — I509 Heart failure, unspecified: Secondary | ICD-10-CM | POA: Diagnosis not present

## 2022-01-15 DIAGNOSIS — R600 Localized edema: Secondary | ICD-10-CM | POA: Diagnosis not present

## 2022-01-15 DIAGNOSIS — J849 Interstitial pulmonary disease, unspecified: Secondary | ICD-10-CM | POA: Diagnosis not present

## 2022-01-15 DIAGNOSIS — I4891 Unspecified atrial fibrillation: Secondary | ICD-10-CM | POA: Diagnosis not present

## 2022-01-15 DIAGNOSIS — R194 Change in bowel habit: Secondary | ICD-10-CM | POA: Diagnosis not present

## 2022-01-19 DIAGNOSIS — I890 Lymphedema, not elsewhere classified: Secondary | ICD-10-CM | POA: Diagnosis not present

## 2022-01-20 DIAGNOSIS — Z23 Encounter for immunization: Secondary | ICD-10-CM | POA: Diagnosis not present

## 2022-01-21 DIAGNOSIS — I890 Lymphedema, not elsewhere classified: Secondary | ICD-10-CM | POA: Diagnosis not present

## 2022-01-27 DIAGNOSIS — I890 Lymphedema, not elsewhere classified: Secondary | ICD-10-CM | POA: Diagnosis not present

## 2022-01-28 DIAGNOSIS — I5032 Chronic diastolic (congestive) heart failure: Secondary | ICD-10-CM | POA: Diagnosis not present

## 2022-01-28 DIAGNOSIS — Z885 Allergy status to narcotic agent status: Secondary | ICD-10-CM | POA: Diagnosis not present

## 2022-01-28 DIAGNOSIS — Z79899 Other long term (current) drug therapy: Secondary | ICD-10-CM | POA: Diagnosis not present

## 2022-01-28 DIAGNOSIS — J849 Interstitial pulmonary disease, unspecified: Secondary | ICD-10-CM | POA: Diagnosis not present

## 2022-01-28 DIAGNOSIS — I43 Cardiomyopathy in diseases classified elsewhere: Secondary | ICD-10-CM | POA: Diagnosis not present

## 2022-01-28 DIAGNOSIS — Z882 Allergy status to sulfonamides status: Secondary | ICD-10-CM | POA: Diagnosis not present

## 2022-01-28 DIAGNOSIS — Z7901 Long term (current) use of anticoagulants: Secondary | ICD-10-CM | POA: Diagnosis not present

## 2022-01-28 DIAGNOSIS — I480 Paroxysmal atrial fibrillation: Secondary | ICD-10-CM | POA: Diagnosis not present

## 2022-01-28 DIAGNOSIS — I4892 Unspecified atrial flutter: Secondary | ICD-10-CM | POA: Diagnosis not present

## 2022-01-28 DIAGNOSIS — I10 Essential (primary) hypertension: Secondary | ICD-10-CM | POA: Diagnosis not present

## 2022-01-28 DIAGNOSIS — E854 Organ-limited amyloidosis: Secondary | ICD-10-CM | POA: Diagnosis not present

## 2022-01-29 DIAGNOSIS — Z950 Presence of cardiac pacemaker: Secondary | ICD-10-CM | POA: Diagnosis not present

## 2022-02-02 DIAGNOSIS — Z950 Presence of cardiac pacemaker: Secondary | ICD-10-CM | POA: Diagnosis not present

## 2022-02-02 DIAGNOSIS — I890 Lymphedema, not elsewhere classified: Secondary | ICD-10-CM | POA: Diagnosis not present

## 2022-02-05 ENCOUNTER — Ambulatory Visit (INDEPENDENT_AMBULATORY_CARE_PROVIDER_SITE_OTHER): Payer: Medicare Other | Admitting: Pulmonary Disease

## 2022-02-05 ENCOUNTER — Encounter: Payer: Self-pay | Admitting: Pulmonary Disease

## 2022-02-05 DIAGNOSIS — J849 Interstitial pulmonary disease, unspecified: Secondary | ICD-10-CM

## 2022-02-05 DIAGNOSIS — I272 Pulmonary hypertension, unspecified: Secondary | ICD-10-CM | POA: Diagnosis not present

## 2022-02-05 NOTE — Progress Notes (Signed)
Subjective:    Patient ID: Cindy [name], female    DOB: 1932/11/18, 88 y.o.   MRN: [id number]  HPI 88 yo never smoker with cardiac amyloid and pulmonary hypertension for FU of ILD and chronic hypoxic respiratory failure. Per last evaluation at [facility] pulmonology , ILD was stable and unlikely to be pulmonary amyloid,?  Sequelae of aspiration was possible due to patulous esophagus.  Dyspnea was attributed to pulmonary hypertension 6-minute walk was about 200 m   Previous follow-up with [facility] cardiology- Dr Lora Paula, [facility] amyloid clinic Dr. Amalia Hailey, previous pulmonary evaluation by Dr. Farley Ly at [facility] and my partner Dr. Melvyn Novas She was diagnosed with cardiac amyloid in 2016 and was on hospice for a while but now has been remarkably stable for many years, and off medication since 2020.  Remains on fentanyl patch. she was diagnosed with pulmonary emboli in 2021 & 2022 and is on lifetime anticoagulation She has chronic lower extremity edema since 2021 due to right heart failure and lymphedema   PMH  - HFpEF - Afib s/p ablation and DCCV (2016), AV block requiring dual lead pacemaker (03/2017), - Cardiac amyloid not on current medications, with preserved EF but restricitive CM/ restrictive LV filling pattern -being followed by [facility] --> 2016 - bortezomid/cytoxan and VITAL study --> 2019/2020 - Isatuximab, stopped per patient - Pulmonary embolism x2 --> Acute PE 08/16/20 in RUL and RML and in 07/08/2019 in the right main pulm artery and LUL - Macular degeneration -lymphedema   30-monthfollow-up visit. Her husband of 632years passed away of lung cancer and she is grieving. She has chronic leg swelling due to lymphedema and is planning to get a pump that she can use at home. I reviewed hematology consultation by Dr. TAmalia Hailey amyloid seems to be in remission. She is compliant with nocturnal oxygen. We reviewed high-resolution CT chest   Significant tests/ events reviewed  Amb sat  06/2021 >> desat to 93% on walking at slow pace     Echo 06/2021 RVSP elevated at 51 mm [facility] RVSP  50 mmHg, mod TR, gr 2 DD   12/2020 VQ neg    HRCT chest 11/2021 bandlike consolidation has decreased bilaterally, stable small Rt effusion loculated  HRCT 11/2020  Mild peripheral reticulation in left upper lobe and superior segment of left lower lobe and posterior right upper lobe, may represent early interstitial lung abnormalities vs sequelae of prior aspiration.  Curvilinear scarring in right lower lobe.   Cardiomegaly and dilated main pulmonary artery measuring 3.5 cm, may represent elevated pulmonary arterial pressures.  Small right pleural effusion.   PFTs  - pfts 04/04/14  VC  2.10 (66%) no airflow obst   DLC0 48%   12/16/2020 FVC (% Pred) 2.05 (76.5) FEV1 (% Pred)  1.69 (84.5) DLCO  38.6% , TLC 88%  6 Minute Walk:  1937mO2 nadir 91% on room air    Review of Systems neg for any significant sore throat, dysphagia, itching, sneezing, nasal congestion or excess/ purulent secretions, fever, chills, sweats, unintended wt loss, pleuritic or exertional cp, hempoptysis, orthopnea pnd or change in chronic leg swelling. Also denies presyncope, palpitations, heartburn, abdominal pain, nausea, vomiting, diarrhea or change in bowel or urinary habits, dysuria,hematuria, rash, arthralgias, visual complaints, headache, numbness weakness or ataxia.     Objective:   Physical Exam  Gen. Pleasant, thin elderly woman, in no distress ENT - no thrush, no pallor/icterus,no post nasal drip Neck: No JVD, no thyromegaly,  no carotid bruits Lungs: no use of accessory muscles, no dullness to percussion, clear without rales or rhonchi  Cardiovascular: Rhythm regular, heart sounds  normal, no murmurs or gallops, no peripheral edema Musculoskeletal: No deformities, no cyanosis or clubbing        Assessment & Plan:

## 2022-02-05 NOTE — Assessment & Plan Note (Signed)
More likely that pulmonary hypertension related to restrictive cardiomyopathy is the main issue.  This also correlates with decreased diffusion capacity. No reason to suspect chronic PE/CT EPH She remains on apixaban for prior history of pulm embolism, likely lifelong

## 2022-02-05 NOTE — Assessment & Plan Note (Signed)
She has curvilinear bandlike scarring at both bases which appears improved from 2022.  No definite ILD although she has some prominence which may be related to amyloid.  This is certainly not progressive and not the main issue

## 2022-02-05 NOTE — Patient Instructions (Signed)
  CT looks better

## 2022-02-09 DIAGNOSIS — I890 Lymphedema, not elsewhere classified: Secondary | ICD-10-CM | POA: Diagnosis not present

## 2022-02-12 DIAGNOSIS — E039 Hypothyroidism, unspecified: Secondary | ICD-10-CM | POA: Diagnosis not present

## 2022-02-12 DIAGNOSIS — E875 Hyperkalemia: Secondary | ICD-10-CM | POA: Diagnosis not present

## 2022-02-16 DIAGNOSIS — I890 Lymphedema, not elsewhere classified: Secondary | ICD-10-CM | POA: Diagnosis not present

## 2022-02-20 ENCOUNTER — Ambulatory Visit (INDEPENDENT_AMBULATORY_CARE_PROVIDER_SITE_OTHER): Payer: Medicare Other | Admitting: Internal Medicine

## 2022-02-20 ENCOUNTER — Encounter: Payer: Self-pay | Admitting: Internal Medicine

## 2022-02-20 VITALS — BP 96/60 | HR 80 | Ht 66.75 in | Wt 126.2 lb

## 2022-02-20 DIAGNOSIS — R159 Full incontinence of feces: Secondary | ICD-10-CM

## 2022-02-20 NOTE — Progress Notes (Signed)
HISTORY OF PRESENT ILLNESS:  Cindy [name] is a 88 y.o. female, friend of Annamary Carolin, with multiple significant medical problems as listed below.  She is self-referred today regarding recent problems with fecal incontinence.  Patient has been caring for her sick husband who passed away 3 months ago.  About that time she began to notice fecal leakage.  This could occur during the day when she was aware or at night when she was not aware.  Generally small amounts.  Most often when the bowels are loose.  She does also notice occasional constipation and increased flatus.  She denies rectal pain or rectal bleeding.  She is on chronic anticoagulation.  She has had prior colonoscopy with Dr. Deatra Ina in May 2016.  Diverticulosis only.  REVIEW OF SYSTEMS:  All non-GI ROS negative unless otherwise stated in the HPI except for arthritis, fatigue, itching, shortness of breath, lower extremity edema, excessive thirst, voice change  Past Medical History:  Diagnosis Date   Amyloidosis ([place])    cancer   CHF (congestive heart failure) (HCC)    Amyloidosis   Diarrhea    Diverticulosis of colon (without mention of hemorrhage)    Edema    Esophagitis, unspecified    Family history of malignant neoplasm of gastrointestinal tract    Fibrosis of lung ([place]) 03/14/2013   Flatulence, eructation, and gas pain    GERD (gastroesophageal reflux disease)    Left sided ulcerative colitis ([place])    Macular degeneration    left eye   Maxillary sinus mass    Osteoporosis, unspecified    Other and unspecified hyperlipidemia    Other and unspecified noninfectious gastroenteritis and colitis(558.9)    Paroxysmal atrial fibrillation ([place]) 07/09/2019   Pneumonia    Stricture and stenosis of esophagus    Unspecified hypothyroidism    URI (upper respiratory infection)     Past Surgical History:  Procedure Laterality Date   ABDOMINAL HYSTERECTOMY     CATARACT EXTRACTION W/PHACO Bilateral    Dr. Gershon Crane    CHOLECYSTECTOMY     THYROIDECTOMY, PARTIAL     YAG LASER APPLICATION Right 04/54/0981   Procedure: YAG LASER APPLICATION;  Surgeon: Elta Guadeloupe T. Gershon Crane, MD;  Location: [facility];  Service: Ophthalmology;  Laterality: Right;    Social History Malinda Adean Moshier  reports that she has never smoked. She has never used smokeless tobacco. She reports that she does not drink alcohol and does not use drugs.  family history includes Adrenal disorder in her son; Colon cancer in her mother; Diabetes in her son; Heart attack in her father; Heart disease in her brother; Kidney cancer in her son; Seizures in her mother; Stomach cancer in her sister.  Allergies  Allergen Reactions   Nebivolol Hcl     Other reaction(s): Other (See Comments) Chest tightness,sob,cough,severe tiredness,confusion   Codeine Nausea Only   Sulfa Antibiotics Nausea Only   Sulfonamide Derivatives Nausea Only       PHYSICAL EXAMINATION: Vital signs: BP 96/60 (BP Location: Left Arm, Patient Position: Sitting, Cuff Size: Normal)   Pulse 80   Ht 5' 6.75" (1.695 m) Comment: height measured without shoes  Wt 126 lb 4 oz (57.3 kg)   BMI 19.92 kg/m   Constitutional: generally well-appearing, no acute distress Psychiatric: alert and oriented x3, cooperative Eyes: extraocular movements intact, anicteric, conjunctiva pink Mouth: oral pharynx moist, no lesions Neck: supple no lymphadenopathy Cardiovascular: heart regular rate and rhythm, no murmur Lungs: clear to auscultation bilaterally Abdomen: soft, nontender, nondistended,  no obvious ascites, no peritoneal signs, normal bowel sounds, no organomegaly Rectal: Extremities: no clubbing or cyanosis.  Lower extremity edema bilaterally (legs are wrapped) Skin: no relevant lesions on visible extremities Neuro: No focal deficits.  Cranial nerves intact  ASSESSMENT:  1.  Fecal incontinence 2.  Multiple medical problems 3.  Colonoscopy 2016 with diverticulosis   PLAN:  1.  Citrucel  2 tablespoons daily 2.  Wear protective undergarments regularly 3.  Schedule bathroom visits 4.  GI follow-up as needed A total time of 45 minutes was spent preparing to see the patient, obtaining history, performing medically appropriate physical examination, counseling and educating the patient regarding the above listed issues, directing medical therapy, and documenting clinical information in the health record

## 2022-02-20 NOTE — Patient Instructions (Signed)
_______________________________________________________  If you are age 85 or older, your body mass index should be between 23-30. Your Body mass index is 19.92 kg/m. If this is out of the aforementioned range listed, please consider follow up with your Primary Care Provider.  If you are age 4 or younger, your body mass index should be between 19-25. Your Body mass index is 19.92 kg/m. If this is out of the aformentioned range listed, please consider follow up with your Primary Care Provider.   ________________________________________________________  The [place] [facility] providers would like to encourage you to use [facility] to communicate with providers for non-urgent requests or questions.  Due to long hold times on the telephone, sending your provider a message by [facility] may be a faster and more efficient way to get a response.  Please allow 48 business hours for a response.  Please remember that this is for non-urgent requests.  _______________________________________________________  Take 2 tablespoons of Citrucel daily  Continue to wear protective undergarments

## 2022-02-25 DIAGNOSIS — R350 Frequency of micturition: Secondary | ICD-10-CM | POA: Diagnosis not present

## 2022-02-25 DIAGNOSIS — R110 Nausea: Secondary | ICD-10-CM | POA: Diagnosis not present

## 2022-02-25 DIAGNOSIS — R197 Diarrhea, unspecified: Secondary | ICD-10-CM | POA: Diagnosis not present

## 2022-02-25 DIAGNOSIS — R0600 Dyspnea, unspecified: Secondary | ICD-10-CM | POA: Diagnosis not present

## 2022-02-26 ENCOUNTER — Ambulatory Visit (HOSPITAL_COMMUNITY)
Admission: RE | Admit: 2022-02-26 | Discharge: 2022-02-26 | Disposition: A | Discharge status: Home / Self Care | Payer: Medicare Other | Source: Ambulatory Visit | Attending: Family Medicine | Admitting: Family Medicine

## 2022-02-26 ENCOUNTER — Other Ambulatory Visit (HOSPITAL_COMMUNITY): Payer: Self-pay | Admitting: Family Medicine

## 2022-02-26 DIAGNOSIS — R0600 Dyspnea, unspecified: Secondary | ICD-10-CM | POA: Diagnosis not present

## 2022-02-26 DIAGNOSIS — J449 Chronic obstructive pulmonary disease, unspecified: Secondary | ICD-10-CM | POA: Diagnosis not present

## 2022-02-26 DIAGNOSIS — J439 Emphysema, unspecified: Secondary | ICD-10-CM | POA: Diagnosis not present

## 2022-03-09 ENCOUNTER — Encounter (INDEPENDENT_AMBULATORY_CARE_PROVIDER_SITE_OTHER): Payer: Medicare Other | Admitting: Ophthalmology

## 2022-03-09 DIAGNOSIS — H353231 Exudative age-related macular degeneration, bilateral, with active choroidal neovascularization: Secondary | ICD-10-CM | POA: Diagnosis not present

## 2022-03-09 DIAGNOSIS — H353133 Nonexudative age-related macular degeneration, bilateral, advanced atrophic without subfoveal involvement: Secondary | ICD-10-CM | POA: Diagnosis not present

## 2022-03-09 DIAGNOSIS — H35352 Cystoid macular degeneration, left eye: Secondary | ICD-10-CM | POA: Diagnosis not present

## 2022-03-09 DIAGNOSIS — H43812 Vitreous degeneration, left eye: Secondary | ICD-10-CM | POA: Diagnosis not present

## 2022-03-17 DIAGNOSIS — E039 Hypothyroidism, unspecified: Secondary | ICD-10-CM | POA: Diagnosis not present

## 2022-03-23 DIAGNOSIS — I517 Cardiomegaly: Secondary | ICD-10-CM | POA: Diagnosis not present

## 2022-03-23 DIAGNOSIS — I442 Atrioventricular block, complete: Secondary | ICD-10-CM | POA: Diagnosis not present

## 2022-03-23 DIAGNOSIS — R0602 Shortness of breath: Secondary | ICD-10-CM | POA: Diagnosis not present

## 2022-03-23 DIAGNOSIS — I5032 Chronic diastolic (congestive) heart failure: Secondary | ICD-10-CM | POA: Diagnosis not present

## 2022-03-23 DIAGNOSIS — E8581 Light chain (AL) amyloidosis: Secondary | ICD-10-CM | POA: Diagnosis not present

## 2022-03-23 DIAGNOSIS — I480 Paroxysmal atrial fibrillation: Secondary | ICD-10-CM | POA: Diagnosis not present

## 2022-03-23 DIAGNOSIS — Z7901 Long term (current) use of anticoagulants: Secondary | ICD-10-CM | POA: Insufficient documentation

## 2022-03-23 DIAGNOSIS — Z8679 Personal history of other diseases of the circulatory system: Secondary | ICD-10-CM | POA: Diagnosis not present

## 2022-03-23 DIAGNOSIS — I440 Atrioventricular block, first degree: Secondary | ICD-10-CM | POA: Diagnosis not present

## 2022-03-23 DIAGNOSIS — I890 Lymphedema, not elsewhere classified: Secondary | ICD-10-CM | POA: Diagnosis not present

## 2022-03-23 DIAGNOSIS — I484 Atypical atrial flutter: Secondary | ICD-10-CM | POA: Diagnosis not present

## 2022-03-23 DIAGNOSIS — Z45018 Encounter for adjustment and management of other part of cardiac pacemaker: Secondary | ICD-10-CM | POA: Diagnosis not present

## 2022-03-23 DIAGNOSIS — R9431 Abnormal electrocardiogram [ECG] [EKG]: Secondary | ICD-10-CM | POA: Diagnosis not present

## 2022-03-23 DIAGNOSIS — I444 Left anterior fascicular block: Secondary | ICD-10-CM | POA: Diagnosis not present

## 2022-03-23 DIAGNOSIS — Z950 Presence of cardiac pacemaker: Secondary | ICD-10-CM | POA: Diagnosis not present

## 2022-03-23 DIAGNOSIS — R001 Bradycardia, unspecified: Secondary | ICD-10-CM | POA: Diagnosis not present

## 2022-03-23 DIAGNOSIS — Z86711 Personal history of pulmonary embolism: Secondary | ICD-10-CM | POA: Diagnosis not present

## 2022-04-01 DIAGNOSIS — R600 Localized edema: Secondary | ICD-10-CM | POA: Diagnosis not present

## 2022-04-01 DIAGNOSIS — I509 Heart failure, unspecified: Secondary | ICD-10-CM | POA: Diagnosis not present

## 2022-04-09 DIAGNOSIS — H35352 Cystoid macular degeneration, left eye: Secondary | ICD-10-CM | POA: Diagnosis not present

## 2022-04-09 DIAGNOSIS — H43812 Vitreous degeneration, left eye: Secondary | ICD-10-CM | POA: Diagnosis not present

## 2022-04-09 DIAGNOSIS — H3321 Serous retinal detachment, right eye: Secondary | ICD-10-CM | POA: Diagnosis not present

## 2022-04-09 DIAGNOSIS — H353231 Exudative age-related macular degeneration, bilateral, with active choroidal neovascularization: Secondary | ICD-10-CM | POA: Diagnosis not present

## 2022-04-09 DIAGNOSIS — H353133 Nonexudative age-related macular degeneration, bilateral, advanced atrophic without subfoveal involvement: Secondary | ICD-10-CM | POA: Diagnosis not present

## 2022-04-16 DIAGNOSIS — E782 Mixed hyperlipidemia: Secondary | ICD-10-CM | POA: Diagnosis not present

## 2022-04-16 DIAGNOSIS — E039 Hypothyroidism, unspecified: Secondary | ICD-10-CM | POA: Diagnosis not present

## 2022-04-16 DIAGNOSIS — R7303 Prediabetes: Secondary | ICD-10-CM | POA: Diagnosis not present

## 2022-04-22 DIAGNOSIS — I509 Heart failure, unspecified: Secondary | ICD-10-CM | POA: Diagnosis not present

## 2022-04-22 DIAGNOSIS — E859 Amyloidosis, unspecified: Secondary | ICD-10-CM | POA: Diagnosis not present

## 2022-04-22 DIAGNOSIS — R7303 Prediabetes: Secondary | ICD-10-CM | POA: Diagnosis not present

## 2022-04-22 DIAGNOSIS — E039 Hypothyroidism, unspecified: Secondary | ICD-10-CM | POA: Diagnosis not present

## 2022-04-22 DIAGNOSIS — I890 Lymphedema, not elsewhere classified: Secondary | ICD-10-CM | POA: Diagnosis not present

## 2022-04-22 DIAGNOSIS — R748 Abnormal levels of other serum enzymes: Secondary | ICD-10-CM | POA: Diagnosis not present

## 2022-04-22 DIAGNOSIS — T148XXD Other injury of unspecified body region, subsequent encounter: Secondary | ICD-10-CM | POA: Diagnosis not present

## 2022-04-22 DIAGNOSIS — J9601 Acute respiratory failure with hypoxia: Secondary | ICD-10-CM | POA: Diagnosis not present

## 2022-04-22 DIAGNOSIS — I4891 Unspecified atrial fibrillation: Secondary | ICD-10-CM | POA: Diagnosis not present

## 2022-04-22 DIAGNOSIS — J849 Interstitial pulmonary disease, unspecified: Secondary | ICD-10-CM | POA: Diagnosis not present

## 2022-04-22 DIAGNOSIS — E875 Hyperkalemia: Secondary | ICD-10-CM | POA: Diagnosis not present

## 2022-04-22 DIAGNOSIS — G894 Chronic pain syndrome: Secondary | ICD-10-CM | POA: Diagnosis not present

## 2022-04-30 DIAGNOSIS — Z950 Presence of cardiac pacemaker: Secondary | ICD-10-CM | POA: Diagnosis not present

## 2022-05-05 DIAGNOSIS — Z950 Presence of cardiac pacemaker: Secondary | ICD-10-CM | POA: Diagnosis not present

## 2022-05-21 DIAGNOSIS — H353133 Nonexudative age-related macular degeneration, bilateral, advanced atrophic without subfoveal involvement: Secondary | ICD-10-CM | POA: Diagnosis not present

## 2022-05-21 DIAGNOSIS — H353211 Exudative age-related macular degeneration, right eye, with active choroidal neovascularization: Secondary | ICD-10-CM | POA: Diagnosis not present

## 2022-05-21 DIAGNOSIS — H353221 Exudative age-related macular degeneration, left eye, with active choroidal neovascularization: Secondary | ICD-10-CM | POA: Diagnosis not present

## 2022-05-21 DIAGNOSIS — H35721 Serous detachment of retinal pigment epithelium, right eye: Secondary | ICD-10-CM | POA: Diagnosis not present

## 2022-05-21 DIAGNOSIS — H3321 Serous retinal detachment, right eye: Secondary | ICD-10-CM | POA: Diagnosis not present

## 2022-05-21 DIAGNOSIS — H43812 Vitreous degeneration, left eye: Secondary | ICD-10-CM | POA: Diagnosis not present

## 2022-05-21 DIAGNOSIS — H35352 Cystoid macular degeneration, left eye: Secondary | ICD-10-CM | POA: Diagnosis not present

## 2022-06-11 DIAGNOSIS — H35352 Cystoid macular degeneration, left eye: Secondary | ICD-10-CM | POA: Diagnosis not present

## 2022-06-11 DIAGNOSIS — H26492 Other secondary cataract, left eye: Secondary | ICD-10-CM | POA: Diagnosis not present

## 2022-06-11 DIAGNOSIS — H353221 Exudative age-related macular degeneration, left eye, with active choroidal neovascularization: Secondary | ICD-10-CM | POA: Diagnosis not present

## 2022-06-11 DIAGNOSIS — H3321 Serous retinal detachment, right eye: Secondary | ICD-10-CM | POA: Diagnosis not present

## 2022-06-18 DIAGNOSIS — H26492 Other secondary cataract, left eye: Secondary | ICD-10-CM | POA: Diagnosis not present

## 2022-06-23 DIAGNOSIS — I480 Paroxysmal atrial fibrillation: Secondary | ICD-10-CM | POA: Diagnosis not present

## 2022-06-23 DIAGNOSIS — E611 Iron deficiency: Secondary | ICD-10-CM | POA: Diagnosis not present

## 2022-06-23 DIAGNOSIS — D649 Anemia, unspecified: Secondary | ICD-10-CM | POA: Diagnosis not present

## 2022-06-23 DIAGNOSIS — E039 Hypothyroidism, unspecified: Secondary | ICD-10-CM | POA: Diagnosis not present

## 2022-06-23 DIAGNOSIS — Z950 Presence of cardiac pacemaker: Secondary | ICD-10-CM | POA: Diagnosis not present

## 2022-06-23 DIAGNOSIS — E8581 Light chain (AL) amyloidosis: Secondary | ICD-10-CM | POA: Diagnosis not present

## 2022-06-23 DIAGNOSIS — R0602 Shortness of breath: Secondary | ICD-10-CM | POA: Diagnosis not present

## 2022-06-23 DIAGNOSIS — Z79899 Other long term (current) drug therapy: Secondary | ICD-10-CM | POA: Diagnosis not present

## 2022-06-23 DIAGNOSIS — I5032 Chronic diastolic (congestive) heart failure: Secondary | ICD-10-CM | POA: Diagnosis not present

## 2022-06-23 DIAGNOSIS — R5382 Chronic fatigue, unspecified: Secondary | ICD-10-CM | POA: Diagnosis not present

## 2022-06-23 DIAGNOSIS — Z45018 Encounter for adjustment and management of other part of cardiac pacemaker: Secondary | ICD-10-CM | POA: Diagnosis not present

## 2022-06-23 DIAGNOSIS — I340 Nonrheumatic mitral (valve) insufficiency: Secondary | ICD-10-CM | POA: Diagnosis not present

## 2022-06-23 DIAGNOSIS — I484 Atypical atrial flutter: Secondary | ICD-10-CM | POA: Diagnosis not present

## 2022-06-25 DIAGNOSIS — H35721 Serous detachment of retinal pigment epithelium, right eye: Secondary | ICD-10-CM | POA: Diagnosis not present

## 2022-06-25 DIAGNOSIS — H26492 Other secondary cataract, left eye: Secondary | ICD-10-CM | POA: Diagnosis not present

## 2022-06-25 DIAGNOSIS — H353221 Exudative age-related macular degeneration, left eye, with active choroidal neovascularization: Secondary | ICD-10-CM | POA: Diagnosis not present

## 2022-06-25 DIAGNOSIS — H35352 Cystoid macular degeneration, left eye: Secondary | ICD-10-CM | POA: Diagnosis not present

## 2022-06-25 DIAGNOSIS — H353211 Exudative age-related macular degeneration, right eye, with active choroidal neovascularization: Secondary | ICD-10-CM | POA: Diagnosis not present

## 2022-06-25 DIAGNOSIS — H43812 Vitreous degeneration, left eye: Secondary | ICD-10-CM | POA: Diagnosis not present

## 2022-06-25 DIAGNOSIS — H3321 Serous retinal detachment, right eye: Secondary | ICD-10-CM | POA: Diagnosis not present

## 2022-07-07 DIAGNOSIS — Z9981 Dependence on supplemental oxygen: Secondary | ICD-10-CM | POA: Diagnosis not present

## 2022-07-07 DIAGNOSIS — J9601 Acute respiratory failure with hypoxia: Secondary | ICD-10-CM | POA: Diagnosis not present

## 2022-07-30 DIAGNOSIS — Z950 Presence of cardiac pacemaker: Secondary | ICD-10-CM | POA: Diagnosis not present

## 2022-07-30 DIAGNOSIS — E039 Hypothyroidism, unspecified: Secondary | ICD-10-CM | POA: Diagnosis not present

## 2022-07-30 DIAGNOSIS — E875 Hyperkalemia: Secondary | ICD-10-CM | POA: Diagnosis not present

## 2022-07-30 DIAGNOSIS — R7303 Prediabetes: Secondary | ICD-10-CM | POA: Diagnosis not present

## 2022-07-30 DIAGNOSIS — E782 Mixed hyperlipidemia: Secondary | ICD-10-CM | POA: Diagnosis not present

## 2022-08-03 DIAGNOSIS — Z0000 Encounter for general adult medical examination without abnormal findings: Secondary | ICD-10-CM | POA: Diagnosis not present

## 2022-08-04 DIAGNOSIS — E039 Hypothyroidism, unspecified: Secondary | ICD-10-CM | POA: Diagnosis not present

## 2022-08-04 DIAGNOSIS — R600 Localized edema: Secondary | ICD-10-CM | POA: Diagnosis not present

## 2022-08-04 DIAGNOSIS — E859 Amyloidosis, unspecified: Secondary | ICD-10-CM | POA: Diagnosis not present

## 2022-08-04 DIAGNOSIS — J849 Interstitial pulmonary disease, unspecified: Secondary | ICD-10-CM | POA: Diagnosis not present

## 2022-08-04 DIAGNOSIS — E782 Mixed hyperlipidemia: Secondary | ICD-10-CM | POA: Diagnosis not present

## 2022-08-04 DIAGNOSIS — I4891 Unspecified atrial fibrillation: Secondary | ICD-10-CM | POA: Diagnosis not present

## 2022-08-04 DIAGNOSIS — G894 Chronic pain syndrome: Secondary | ICD-10-CM | POA: Diagnosis not present

## 2022-08-04 DIAGNOSIS — J9601 Acute respiratory failure with hypoxia: Secondary | ICD-10-CM | POA: Diagnosis not present

## 2022-08-04 DIAGNOSIS — I890 Lymphedema, not elsewhere classified: Secondary | ICD-10-CM | POA: Diagnosis not present

## 2022-08-04 DIAGNOSIS — I509 Heart failure, unspecified: Secondary | ICD-10-CM | POA: Diagnosis not present

## 2022-08-04 DIAGNOSIS — R7303 Prediabetes: Secondary | ICD-10-CM | POA: Diagnosis not present

## 2022-08-04 DIAGNOSIS — T148XXD Other injury of unspecified body region, subsequent encounter: Secondary | ICD-10-CM | POA: Diagnosis not present

## 2022-08-06 DIAGNOSIS — H353221 Exudative age-related macular degeneration, left eye, with active choroidal neovascularization: Secondary | ICD-10-CM | POA: Diagnosis not present

## 2022-08-06 DIAGNOSIS — H353211 Exudative age-related macular degeneration, right eye, with active choroidal neovascularization: Secondary | ICD-10-CM | POA: Diagnosis not present

## 2022-08-06 DIAGNOSIS — H353133 Nonexudative age-related macular degeneration, bilateral, advanced atrophic without subfoveal involvement: Secondary | ICD-10-CM | POA: Diagnosis not present

## 2022-08-06 DIAGNOSIS — H35352 Cystoid macular degeneration, left eye: Secondary | ICD-10-CM | POA: Diagnosis not present

## 2022-08-06 DIAGNOSIS — H43812 Vitreous degeneration, left eye: Secondary | ICD-10-CM | POA: Diagnosis not present

## 2022-08-10 DIAGNOSIS — H3321 Serous retinal detachment, right eye: Secondary | ICD-10-CM | POA: Diagnosis not present

## 2022-08-10 DIAGNOSIS — H353221 Exudative age-related macular degeneration, left eye, with active choroidal neovascularization: Secondary | ICD-10-CM | POA: Diagnosis not present

## 2022-08-10 DIAGNOSIS — H353211 Exudative age-related macular degeneration, right eye, with active choroidal neovascularization: Secondary | ICD-10-CM | POA: Diagnosis not present

## 2022-08-10 DIAGNOSIS — H353133 Nonexudative age-related macular degeneration, bilateral, advanced atrophic without subfoveal involvement: Secondary | ICD-10-CM | POA: Diagnosis not present

## 2022-08-12 DIAGNOSIS — E8581 Light chain (AL) amyloidosis: Secondary | ICD-10-CM | POA: Diagnosis not present

## 2022-08-12 DIAGNOSIS — I5032 Chronic diastolic (congestive) heart failure: Secondary | ICD-10-CM | POA: Diagnosis not present

## 2022-08-12 DIAGNOSIS — Z79899 Other long term (current) drug therapy: Secondary | ICD-10-CM | POA: Diagnosis not present

## 2022-08-12 DIAGNOSIS — C9001 Multiple myeloma in remission: Secondary | ICD-10-CM | POA: Diagnosis not present

## 2022-08-12 DIAGNOSIS — I43 Cardiomyopathy in diseases classified elsewhere: Secondary | ICD-10-CM | POA: Diagnosis not present

## 2022-08-12 DIAGNOSIS — Z86711 Personal history of pulmonary embolism: Secondary | ICD-10-CM | POA: Diagnosis not present

## 2022-08-12 DIAGNOSIS — I4891 Unspecified atrial fibrillation: Secondary | ICD-10-CM | POA: Diagnosis not present

## 2022-08-12 DIAGNOSIS — J849 Interstitial pulmonary disease, unspecified: Secondary | ICD-10-CM | POA: Diagnosis not present

## 2022-08-12 DIAGNOSIS — I4892 Unspecified atrial flutter: Secondary | ICD-10-CM | POA: Diagnosis not present

## 2022-08-12 DIAGNOSIS — I890 Lymphedema, not elsewhere classified: Secondary | ICD-10-CM | POA: Diagnosis not present

## 2022-08-12 DIAGNOSIS — E854 Organ-limited amyloidosis: Secondary | ICD-10-CM | POA: Diagnosis not present

## 2022-08-12 DIAGNOSIS — M549 Dorsalgia, unspecified: Secondary | ICD-10-CM | POA: Diagnosis not present

## 2022-08-12 DIAGNOSIS — Z7901 Long term (current) use of anticoagulants: Secondary | ICD-10-CM | POA: Diagnosis not present

## 2022-08-12 DIAGNOSIS — H3530 Unspecified macular degeneration: Secondary | ICD-10-CM | POA: Diagnosis not present

## 2022-08-12 DIAGNOSIS — G8929 Other chronic pain: Secondary | ICD-10-CM | POA: Diagnosis not present

## 2022-08-13 DIAGNOSIS — R5382 Chronic fatigue, unspecified: Secondary | ICD-10-CM | POA: Diagnosis not present

## 2022-08-13 DIAGNOSIS — Z950 Presence of cardiac pacemaker: Secondary | ICD-10-CM | POA: Diagnosis not present

## 2022-08-13 DIAGNOSIS — I5032 Chronic diastolic (congestive) heart failure: Secondary | ICD-10-CM | POA: Diagnosis not present

## 2022-08-13 DIAGNOSIS — E8581 Light chain (AL) amyloidosis: Secondary | ICD-10-CM | POA: Diagnosis not present

## 2022-08-13 DIAGNOSIS — I484 Atypical atrial flutter: Secondary | ICD-10-CM | POA: Diagnosis not present

## 2022-08-13 DIAGNOSIS — I480 Paroxysmal atrial fibrillation: Secondary | ICD-10-CM | POA: Diagnosis not present

## 2022-08-13 DIAGNOSIS — I442 Atrioventricular block, complete: Secondary | ICD-10-CM | POA: Diagnosis not present

## 2022-08-14 DIAGNOSIS — Z950 Presence of cardiac pacemaker: Secondary | ICD-10-CM | POA: Diagnosis not present

## 2022-08-19 ENCOUNTER — Telehealth: Payer: Self-pay | Admitting: Pulmonary Disease

## 2022-08-19 NOTE — Telephone Encounter (Signed)
Patient called to inform the nurse and doctor that she has been coughing and it has a little blood in the mucus.  Patient has an appt. On 5/15 but didn't know if she should see the doctor before that.  Please advise and call patient to discuss further at [phone]

## 2022-08-27 NOTE — Telephone Encounter (Signed)
ATC patient. LVMTCB. 

## 2022-09-01 ENCOUNTER — Ambulatory Visit: Payer: Medicare Other | Admitting: Pulmonary Disease

## 2022-09-02 ENCOUNTER — Ambulatory Visit (INDEPENDENT_AMBULATORY_CARE_PROVIDER_SITE_OTHER): Payer: Medicare Other | Admitting: Pulmonary Disease

## 2022-09-02 ENCOUNTER — Encounter: Payer: Self-pay | Admitting: Pulmonary Disease

## 2022-09-02 VITALS — BP 90/60 | HR 77 | Ht 67.0 in | Wt 127.6 lb

## 2022-09-02 DIAGNOSIS — J9611 Chronic respiratory failure with hypoxia: Secondary | ICD-10-CM | POA: Diagnosis not present

## 2022-09-02 DIAGNOSIS — J849 Interstitial pulmonary disease, unspecified: Secondary | ICD-10-CM | POA: Diagnosis not present

## 2022-09-02 NOTE — Assessment & Plan Note (Signed)
Unclear cause, she does not appear to be fluid overloaded.  Pulm hypertension is not worse on echo from March.  She does not seem to have significant ILD. She will continue on 2 L oxygen POC

## 2022-09-02 NOTE — Patient Instructions (Signed)
  X HR CT chest to follow up on ILD

## 2022-09-02 NOTE — Progress Notes (Signed)
Subjective:    Patient ID: Cindy [name], female    DOB: 06-Dec-1932, 89 y.o.   MRN: [id number]  HPI  89 yo never smoker with cardiac amyloid and pulmonary hypertension for FU of ILD and chronic hypoxic respiratory failure. Per last evaluation at [facility] pulmonology , ILD was stable and unlikely to be pulmonary amyloid,?  Sequelae of aspiration was possible due to patulous esophagus.  Dyspnea was attributed to pulmonary hypertension 6-minute walk was about 200 m   Previous follow-up with [facility] cardiology- Dr Carilyn Goodpasture, [facility] amyloid clinic Dr. Leeanne Deed, previous pulmonary evaluation by Dr. Dudley Major at [facility] and my partner Dr. Sherene Sires She was diagnosed with cardiac amyloid in 2016 and was on hospice for a while but now has been remarkably stable for many years, and off medication since 2020.  Remains on fentanyl patch. she was diagnosed with pulmonary emboli in 2021 & 2022 and is on lifetime anticoagulation She has chronic lower extremity edema since 2021 due to right heart failure and lymphedema     PMH  - HFpEF - Afib s/p ablation and DCCV (2016), AV block requiring dual lead pacemaker (03/2017), - Cardiac amyloid not on current medications, with preserved EF but restricitive CM/ restrictive LV filling pattern -being followed by [facility] --> 2016 - bortezomid/cytoxan and VITAL study --> 2019/2020 - Isatuximab, stopped per patient - Pulmonary embolism x2 --> Acute PE 08/16/20 in RUL and RML and in 07/08/2019 in the right main pulm artery and LUL - Macular degeneration -lymphedema -remote h/o ulcerative colitis  Chief Complaint  Patient presents with   Follow-up    Pt f/u states that a week ago she was coughing up dark sputum but that has improved and she is doing better other than having some DOE   70-month follow-up visit. She is using oxygen pretty much all the time now.  She was able to obtain a lightweight POC. We reviewed previous CT scan. We reviewed hematology follow-up  consultation. Echo 06/2022 at [facility] showed moderate TR but normal RV function She takes Lasix every other day. She reports episode of coughing about a week ago and had some dark sputum.  She remains on apixaban.  This is now resolved  Significant tests/ events reviewed   Amb sat 06/2021 >> desat to 93% on walking at slow pace     Echo 06/2021 RVSP elevated at 51 mm 12/2020 TTE at [facility] ,estimated RVSP  50 mmHg, mod TR, gr 2 DD   12/2020 VQ neg     HRCT chest 11/2021 bandlike consolidation has decreased bilaterally, stable small Rt effusion loculated   HRCT 11/2020  Mild peripheral reticulation in left upper lobe and superior segment of left lower lobe and posterior right upper lobe, may represent early interstitial lung abnormalities vs sequelae of prior aspiration.  Curvilinear scarring in right lower lobe.   Cardiomegaly and dilated main pulmonary artery measuring 3.5 cm, may represent elevated pulmonary arterial pressures.  Small right pleural effusion.   PFTs  - pfts 04/04/14  VC  2.10 (66%) no airflow obst   DLC0 48%   12/16/2020 FVC (% Pred) 2.05 (76.5) FEV1 (% Pred)  1.69 (84.5) DLCO  38.6% , TLC 88%  6 Minute Walk:  129m, O2 nadir 91% on room air   Review of Systems neg for any significant sore throat, dysphagia, itching, sneezing, nasal congestion or excess/ purulent secretions, fever, chills, sweats, unintended wt loss, pleuritic or exertional cp, hempoptysis, orthopnea pnd or change in chronic leg swelling.  Also denies presyncope, palpitations, heartburn, abdominal pain, nausea, vomiting, diarrhea or change in bowel or urinary habits, dysuria,hematuria, rash, arthralgias, visual complaints, headache, numbness weakness or ataxia.     Objective:   Physical Exam  Gen. Pleasant, well-nourished, elderly in no distress ENT - no thrush, no pallor/icterus,no post nasal drip Neck: No JVD, no thyromegaly, no carotid bruits Lungs: no use of accessory muscles, no dullness to  percussion, RT basal rales no rhonchi  Cardiovascular: Rhythm regular, heart sounds  normal, no murmurs or gallops, no peripheral edema Musculoskeletal: No deformities, no cyanosis or clubbing        Assessment & Plan:

## 2022-09-02 NOTE — Assessment & Plan Note (Signed)
She did not have significant ILD on last CT scan but favored postinflammatory/plan scarring. We will obtain 1 year follow-up high-resolution CT chest to clarify

## 2022-09-15 DIAGNOSIS — E860 Dehydration: Secondary | ICD-10-CM | POA: Diagnosis not present

## 2022-09-15 DIAGNOSIS — R110 Nausea: Secondary | ICD-10-CM | POA: Diagnosis not present

## 2022-09-17 ENCOUNTER — Encounter (HOSPITAL_COMMUNITY): Payer: Self-pay | Admitting: *Deleted

## 2022-09-17 ENCOUNTER — Emergency Department (HOSPITAL_COMMUNITY): Payer: Medicare Other

## 2022-09-17 ENCOUNTER — Other Ambulatory Visit: Payer: Self-pay

## 2022-09-17 ENCOUNTER — Inpatient Hospital Stay (HOSPITAL_COMMUNITY)
Admission: EM | Admit: 2022-09-17 | Discharge: 2022-09-19 | DRG: 291 | Disposition: A | Discharge status: Home / Self Care | Payer: Medicare Other | Attending: Internal Medicine | Admitting: Internal Medicine

## 2022-09-17 DIAGNOSIS — J8410 Pulmonary fibrosis, unspecified: Secondary | ICD-10-CM | POA: Diagnosis present

## 2022-09-17 DIAGNOSIS — K219 Gastro-esophageal reflux disease without esophagitis: Secondary | ICD-10-CM | POA: Diagnosis present

## 2022-09-17 DIAGNOSIS — Z950 Presence of cardiac pacemaker: Secondary | ICD-10-CM

## 2022-09-17 DIAGNOSIS — E8581 Light chain (AL) amyloidosis: Secondary | ICD-10-CM | POA: Diagnosis present

## 2022-09-17 DIAGNOSIS — Z1152 Encounter for screening for COVID-19: Secondary | ICD-10-CM | POA: Diagnosis not present

## 2022-09-17 DIAGNOSIS — Z66 Do not resuscitate: Secondary | ICD-10-CM | POA: Diagnosis present

## 2022-09-17 DIAGNOSIS — R0602 Shortness of breath: Secondary | ICD-10-CM | POA: Diagnosis not present

## 2022-09-17 DIAGNOSIS — Z90710 Acquired absence of both cervix and uterus: Secondary | ICD-10-CM

## 2022-09-17 DIAGNOSIS — J90 Pleural effusion, not elsewhere classified: Secondary | ICD-10-CM | POA: Diagnosis not present

## 2022-09-17 DIAGNOSIS — J449 Chronic obstructive pulmonary disease, unspecified: Secondary | ICD-10-CM | POA: Diagnosis present

## 2022-09-17 DIAGNOSIS — I442 Atrioventricular block, complete: Secondary | ICD-10-CM | POA: Diagnosis present

## 2022-09-17 DIAGNOSIS — I425 Other restrictive cardiomyopathy: Secondary | ICD-10-CM | POA: Diagnosis present

## 2022-09-17 DIAGNOSIS — Z91148 Patient's other noncompliance with medication regimen for other reason: Secondary | ICD-10-CM

## 2022-09-17 DIAGNOSIS — I10 Essential (primary) hypertension: Secondary | ICD-10-CM | POA: Diagnosis present

## 2022-09-17 DIAGNOSIS — E854 Organ-limited amyloidosis: Secondary | ICD-10-CM | POA: Diagnosis present

## 2022-09-17 DIAGNOSIS — I110 Hypertensive heart disease with heart failure: Principal | ICD-10-CM | POA: Diagnosis present

## 2022-09-17 DIAGNOSIS — E871 Hypo-osmolality and hyponatremia: Secondary | ICD-10-CM | POA: Diagnosis not present

## 2022-09-17 DIAGNOSIS — Z9841 Cataract extraction status, right eye: Secondary | ICD-10-CM

## 2022-09-17 DIAGNOSIS — J9621 Acute and chronic respiratory failure with hypoxia: Secondary | ICD-10-CM | POA: Diagnosis present

## 2022-09-17 DIAGNOSIS — T502X6A Underdosing of carbonic-anhydrase inhibitors, benzothiadiazides and other diuretics, initial encounter: Secondary | ICD-10-CM | POA: Diagnosis present

## 2022-09-17 DIAGNOSIS — Z86711 Personal history of pulmonary embolism: Secondary | ICD-10-CM | POA: Diagnosis not present

## 2022-09-17 DIAGNOSIS — M810 Age-related osteoporosis without current pathological fracture: Secondary | ICD-10-CM | POA: Diagnosis present

## 2022-09-17 DIAGNOSIS — Y92009 Unspecified place in unspecified non-institutional (private) residence as the place of occurrence of the external cause: Secondary | ICD-10-CM | POA: Diagnosis not present

## 2022-09-17 DIAGNOSIS — Z882 Allergy status to sulfonamides status: Secondary | ICD-10-CM | POA: Diagnosis not present

## 2022-09-17 DIAGNOSIS — I272 Pulmonary hypertension, unspecified: Secondary | ICD-10-CM | POA: Diagnosis present

## 2022-09-17 DIAGNOSIS — Z7901 Long term (current) use of anticoagulants: Secondary | ICD-10-CM

## 2022-09-17 DIAGNOSIS — Z8249 Family history of ischemic heart disease and other diseases of the circulatory system: Secondary | ICD-10-CM

## 2022-09-17 DIAGNOSIS — Z8051 Family history of malignant neoplasm of kidney: Secondary | ICD-10-CM

## 2022-09-17 DIAGNOSIS — I5033 Acute on chronic diastolic (congestive) heart failure: Secondary | ICD-10-CM | POA: Diagnosis present

## 2022-09-17 DIAGNOSIS — E785 Hyperlipidemia, unspecified: Secondary | ICD-10-CM | POA: Diagnosis present

## 2022-09-17 DIAGNOSIS — Z9842 Cataract extraction status, left eye: Secondary | ICD-10-CM

## 2022-09-17 DIAGNOSIS — Z885 Allergy status to narcotic agent status: Secondary | ICD-10-CM | POA: Diagnosis not present

## 2022-09-17 DIAGNOSIS — I509 Heart failure, unspecified: Secondary | ICD-10-CM | POA: Diagnosis not present

## 2022-09-17 DIAGNOSIS — E039 Hypothyroidism, unspecified: Secondary | ICD-10-CM | POA: Diagnosis present

## 2022-09-17 DIAGNOSIS — J849 Interstitial pulmonary disease, unspecified: Secondary | ICD-10-CM | POA: Diagnosis not present

## 2022-09-17 DIAGNOSIS — I480 Paroxysmal atrial fibrillation: Secondary | ICD-10-CM | POA: Diagnosis present

## 2022-09-17 DIAGNOSIS — Z833 Family history of diabetes mellitus: Secondary | ICD-10-CM

## 2022-09-17 DIAGNOSIS — Z9049 Acquired absence of other specified parts of digestive tract: Secondary | ICD-10-CM

## 2022-09-17 DIAGNOSIS — Z888 Allergy status to other drugs, medicaments and biological substances status: Secondary | ICD-10-CM

## 2022-09-17 DIAGNOSIS — J811 Chronic pulmonary edema: Secondary | ICD-10-CM | POA: Diagnosis not present

## 2022-09-17 DIAGNOSIS — Z79890 Hormone replacement therapy (postmenopausal): Secondary | ICD-10-CM

## 2022-09-17 DIAGNOSIS — Z800 Family history of malignant neoplasm of digestive organs: Secondary | ICD-10-CM

## 2022-09-17 DIAGNOSIS — I43 Cardiomyopathy in diseases classified elsewhere: Secondary | ICD-10-CM | POA: Diagnosis present

## 2022-09-17 DIAGNOSIS — Z79899 Other long term (current) drug therapy: Secondary | ICD-10-CM

## 2022-09-17 DIAGNOSIS — I2699 Other pulmonary embolism without acute cor pulmonale: Secondary | ICD-10-CM | POA: Diagnosis present

## 2022-09-17 DIAGNOSIS — Z961 Presence of intraocular lens: Secondary | ICD-10-CM | POA: Diagnosis present

## 2022-09-17 LAB — CBC WITH DIFFERENTIAL/PLATELET
Abs Immature Granulocytes: 0.02 10*3/uL (ref 0.00–0.07)
Basophils Absolute: 0.1 10*3/uL (ref 0.0–0.1)
Basophils Relative: 1 %
Eosinophils Absolute: 0.2 10*3/uL (ref 0.0–0.5)
Eosinophils Relative: 3 %
HCT: 36.9 % (ref 36.0–46.0)
Hemoglobin: 12.2 g/dL (ref 12.0–15.0)
Immature Granulocytes: 0 %
Lymphocytes Relative: 15 %
Lymphs Abs: 0.9 10*3/uL (ref 0.7–4.0)
MCH: 33.2 pg (ref 26.0–34.0)
MCHC: 33.1 g/dL (ref 30.0–36.0)
MCV: 100.3 fL — ABNORMAL HIGH (ref 80.0–100.0)
Monocytes Absolute: 0.4 10*3/uL (ref 0.1–1.0)
Monocytes Relative: 7 %
Neutro Abs: 4.2 10*3/uL (ref 1.7–7.7)
Neutrophils Relative %: 74 %
Platelets: 214 10*3/uL (ref 150–400)
RBC: 3.68 MIL/uL — ABNORMAL LOW (ref 3.87–5.11)
RDW: 13.7 % (ref 11.5–15.5)
WBC: 5.7 10*3/uL (ref 4.0–10.5)
nRBC: 0 % (ref 0.0–0.2)

## 2022-09-17 LAB — BASIC METABOLIC PANEL
Anion gap: 10 (ref 5–15)
BUN: 12 mg/dL (ref 8–23)
CO2: 23 mmol/L (ref 22–32)
Calcium: 8.9 mg/dL (ref 8.9–10.3)
Chloride: 97 mmol/L — ABNORMAL LOW (ref 98–111)
Creatinine, Ser: 0.68 mg/dL (ref 0.44–1.00)
GFR, Estimated: >60 mL/min (ref >60)
Glucose, Bld: 140 mg/dL — ABNORMAL HIGH (ref 70–99)
Potassium: 3.8 mmol/L (ref 3.5–5.1)
Sodium: 130 mmol/L — ABNORMAL LOW (ref 135–145)

## 2022-09-17 LAB — TROPONIN I (HIGH SENSITIVITY)
Troponin I (High Sensitivity): 7 ng/L (ref <18)
Troponin I (High Sensitivity): 7 ng/L (ref <18)

## 2022-09-17 LAB — SARS CORONAVIRUS 2 BY RT PCR: SARS Coronavirus 2 by RT PCR: NEGATIVE

## 2022-09-17 LAB — BRAIN NATRIURETIC PEPTIDE: B Natriuretic Peptide: 315 pg/mL — ABNORMAL HIGH (ref 0.0–100.0)

## 2022-09-17 MED ORDER — FUROSEMIDE 10 MG/ML IJ SOLN
40.0000 mg | Freq: Two times a day (BID) | INTRAMUSCULAR | Status: DC
  Administered 2022-09-17 – 2022-09-19 (×4): 40 mg via INTRAVENOUS
  Filled 2022-09-17 (×4): qty 4

## 2022-09-17 MED ORDER — ONDANSETRON HCL 4 MG PO TABS: 4.0000 mg | ORAL_TABLET | Freq: Four times a day (QID) | ORAL | Status: DC | PRN

## 2022-09-17 MED ORDER — ALBUTEROL SULFATE (2.5 MG/3ML) 0.083% IN NEBU: 2.5000 mg | INHALATION_SOLUTION | RESPIRATORY_TRACT | Status: DC | PRN

## 2022-09-17 MED ORDER — ALPRAZOLAM 0.5 MG PO TABS
0.5000 mg | ORAL_TABLET | Freq: Every evening | ORAL | Status: DC | PRN
  Administered 2022-09-17 – 2022-09-18 (×2): 0.5 mg via ORAL
  Filled 2022-09-17 (×2): qty 1

## 2022-09-17 MED ORDER — ONDANSETRON HCL 4 MG/2ML IJ SOLN: 4.0000 mg | Freq: Four times a day (QID) | INTRAMUSCULAR | Status: DC | PRN

## 2022-09-17 MED ORDER — METOPROLOL SUCCINATE ER 25 MG PO TB24
12.5000 mg | ORAL_TABLET | Freq: Every day | ORAL | Status: DC
  Administered 2022-09-18 – 2022-09-19 (×2): 12.5 mg via ORAL
  Filled 2022-09-17 (×2): qty 1

## 2022-09-17 MED ORDER — POLYETHYLENE GLYCOL 3350 17 G PO PACK: 17.0000 g | PACK | Freq: Every day | ORAL | Status: DC | PRN

## 2022-09-17 MED ORDER — APIXABAN 5 MG PO TABS
5.0000 mg | ORAL_TABLET | Freq: Two times a day (BID) | ORAL | Status: DC
  Administered 2022-09-18 – 2022-09-19 (×3): 5 mg via ORAL
  Filled 2022-09-17 (×4): qty 1

## 2022-09-17 MED ORDER — FUROSEMIDE 10 MG/ML IJ SOLN
20.0000 mg | Freq: Once | INTRAMUSCULAR | Status: AC
  Administered 2022-09-17: 20 mg via INTRAVENOUS
  Filled 2022-09-17: qty 2

## 2022-09-17 MED ORDER — ACETAMINOPHEN 325 MG PO TABS: 650.0000 mg | ORAL_TABLET | Freq: Four times a day (QID) | ORAL | Status: DC | PRN

## 2022-09-17 MED ORDER — ACETAMINOPHEN 650 MG RE SUPP: 650.0000 mg | Freq: Four times a day (QID) | RECTAL | Status: DC | PRN

## 2022-09-17 NOTE — H&P (Signed)
History and Physical    Cindy [name] VWU:[id number] DOB: 1933-02-09 DOA: 09/17/2022  PCP: Benita Stabile, MD   Patient coming from: Home  I have personally briefly reviewed patient's old medical records in [facility]  Chief Complaint: Difficulty Breathing   HPI: Cindy [name] is a 89 y.o. female with medical history significant for diastolic CHF, chronic respiratory failure on 2-1/2 L, cardiac amyloidosis, COPD, interstitial lung disease, atrial fibrillation, pulmonary hypertension, pulmonary embolism Patient presented to the ED with complaints of increasing difficulty breathing over the past 3 days,.  She reports feeling unwell over the past 1 to 2 weeks.  She also reports increasing bilateral lower extremity swelling.  No cough.  No chest pain. Patient was previously on torsemide 20 mg daily up until a month ago when she was told that she was "dry", to stop taking torsemide.  She reports rather than stopping it, she started taking the torsemide every other day as she had lower extremity swelling.  She is also on Eliquis and compliant.  ED Course: O2 sats 98 to 100% on 3 L, she was placed on 2 L and ambulated O2 sats dropped to 89%.  BNP elevated at 315 from baseline.  Sodium 130.  Chest x-ray shows pulmonary venous congestion, and new small right pleural effusion, chronic ILD. Troponin 7 x 2. IV Lasix 20mg  x 1 given.  Review of Systems: As per HPI all other systems reviewed and negative.  Past Medical History:  Diagnosis Date   Amyloidosis (HCC)    cancer   CHF (congestive heart failure) (HCC)    Amyloidosis   Diarrhea    Diverticulosis of colon (without mention of hemorrhage)    Edema    Esophagitis, unspecified    Family history of malignant neoplasm of gastrointestinal tract    Fibrosis of lung (HCC) 03/14/2013   Flatulence, eructation, and gas pain    GERD (gastroesophageal reflux disease)    Left sided ulcerative colitis (HCC)    Macular degeneration     left eye   Maxillary sinus mass    Osteoporosis, unspecified    Other and unspecified hyperlipidemia    Other and unspecified noninfectious gastroenteritis and colitis(558.9)    Paroxysmal atrial fibrillation (HCC) 07/09/2019   Pneumonia    Stricture and stenosis of esophagus    Unspecified hypothyroidism    URI (upper respiratory infection)     Past Surgical History:  Procedure Laterality Date   ABDOMINAL HYSTERECTOMY     CATARACT EXTRACTION W/PHACO Bilateral    Dr. Nile Riggs   CHOLECYSTECTOMY     THYROIDECTOMY, PARTIAL     YAG LASER APPLICATION Right 12/12/2013   Procedure: YAG LASER APPLICATION;  Surgeon: Loraine Leriche T. Nile Riggs, MD;  Location: [facility];  Service: Ophthalmology;  Laterality: Right;     reports that she has never smoked. She has never used smokeless tobacco. She reports that she does not drink alcohol and does not use drugs.  Allergies  Allergen Reactions   Nebivolol Hcl     Other reaction(s): Other (See Comments) Chest tightness,sob,cough,severe tiredness,confusion   Codeine Nausea Only   Sulfa Antibiotics Nausea Only   Sulfonamide Derivatives Nausea Only    Family History  Problem Relation Age of Onset   Colon cancer Mother    Seizures Mother    Heart attack Father        CHF   Stomach cancer Sister    Heart disease Brother    Diabetes Son    Adrenal disorder  Son    Kidney cancer Son     Prior to Admission medications   Medication Sig Start Date End Date Taking? Authorizing Provider  acetaminophen (TYLENOL) 650 MG CR tablet Take 1,300 mg by mouth 2 (two) times daily.   Yes [provider]  albuterol (PROVENTIL) (2.5 MG/3ML) 0.083% nebulizer solution Take 2.5 mg by nebulization. 2-3 times a week   Yes [provider]  ALPRAZolam (XANAX) 0.5 MG tablet Take 1 tablet (0.5 mg total) by mouth at bedtime as needed for anxiety. 03/15/13  Yes Alison Murray, MD  apixaban (ELIQUIS) 5 MG TABS tablet Take 5 mg by mouth 2 (two) times daily.   Yes  [provider]  BIOTIN PO Take 1 tablet by mouth daily.   Yes [provider]  Cholecalciferol 50 MCG (2000 UT) CAPS Take 1 capsule by mouth daily.   Yes [provider]  cyanocobalamin (VITAMIN B12) 1000 MCG tablet Take 1,000 mcg by mouth daily.   Yes [provider]  diclofenac Sodium (VOLTAREN) 1 % GEL Apply topically.   Yes [provider]  esomeprazole (NEXIUM) 40 MG capsule Take 40 mg by mouth daily. 11/22/21  Yes [provider]  fentaNYL (DURAGESIC) 25 MCG/HR Place 1 patch onto the skin every 3 (three) days. 06/15/19  Yes [provider]  Fluocinolone Acetonide 0.01 % OIL Place 5 drops into both ears as needed (itching). As needed for itching 08/14/21  Yes [provider]  metoprolol succinate (TOPROL-XL) 25 MG 24 hr tablet Take 0.5-1 tablets (12.5-25 mg total) by mouth in the morning and at bedtime. Patient takes 1 tablet (25mg ) in the morning and 1/2 of a tablet (12.5mg ) in the evening Patient taking differently: Take 12.5 mg by mouth daily. 07/10/19  Yes Vassie Loll, MD  Multiple Vitamins-Minerals (PRESERVISION AREDS PO) Take 1 capsule by mouth daily.   Yes [provider]  ondansetron (ZOFRAN) 4 MG tablet Take 4 mg by mouth every 8 (eight) hours as needed. 08/03/19  Yes [provider]  Polyethyl Glycol-Propyl Glycol (SYSTANE OP) Apply 1 drop to eye daily.   Yes [provider]  potassium chloride (KLOR-CON M) 10 MEQ tablet Take 10 mEq by mouth daily. 02/05/22  Yes [provider]  Probiotic Product (ALIGN) 4 MG CAPS Take 1 tablet by mouth every morning.   Yes [provider]  sodium chloride (OCEAN) 0.65 % nasal spray Place 2 sprays into the nose daily as needed.    Yes [provider]  torsemide (DEMADEX) 20 MG tablet Take 20 mg by mouth every other day. 03/28/19  Yes [provider]  ipratropium-albuterol (DUONEB) 0.5-2.5 (3) MG/3ML SOLN Take 3 mLs by  nebulization every 4 (four) hours as needed (Shortness of breath and wheezing). 06/20/21   Vassie Loll, MD  levothyroxine (SYNTHROID) 137 MCG tablet Take 137 mcg by mouth daily. 02/16/22   [provider]  senna (SENOKOT) 8.6 MG tablet Take 2 tablets by mouth as needed for constipation.    [provider]    Physical Exam: Vitals:   09/17/22 1651 09/17/22 1730 09/17/22 1800 09/17/22 1835  BP:  116/67 124/68 (!) 144/85  Pulse:  78 75 74  Resp: 18 20 19 18   Temp:    97.8 F (36.6 C)  TempSrc:    Oral  SpO2:  100% 98% 100%  Weight:      Height:        Constitutional: NAD, calm, comfortable Vitals:   09/17/22 1651  09/17/22 1730 09/17/22 1800 09/17/22 1835  BP:  116/67 124/68 (!) 144/85  Pulse:  78 75 74  Resp: 18 20 19 18   Temp:    97.8 F (36.6 C)  TempSrc:    Oral  SpO2:  100% 98% 100%  Weight:      Height:       Eyes: PERRL, lids and conjunctivae normal ENMT: Mucous membranes are moist.  Neck: normal, supple, no masses, no thyromegaly Respiratory: clear to auscultation bilaterally, no wheezing, no crackles. Normal respiratory effort. No accessory muscle use.  Cardiovascular: Regular rate and rhythm, no murmurs / rubs / gallops.  Swelling around ankles, 1+ pitting edema to mid leg bilaterally.   Abdomen: no tenderness, no masses palpated. No hepatosplenomegaly. Bowel sounds positive.  Musculoskeletal: no clubbing / cyanosis. No joint deformity upper and lower extremities.  Skin: no rashes, lesions, ulcers. No induration Neurologic: No facial asymmetry, moving extremities spontaneously Psychiatric: Normal judgment and insight. Alert and oriented x 3. Normal mood.   Labs on Admission: I have personally reviewed following labs and imaging studies  CBC: Recent Labs  Lab 09/17/22 1700  WBC 5.7  NEUTROABS 4.2  HGB 12.2  HCT 36.9  MCV 100.3*  PLT 214   Basic Metabolic Panel: Recent Labs  Lab 09/17/22 1700  NA 130*  K 3.8  CL 97*  CO2 23   GLUCOSE 140*  BUN 12  CREATININE 0.68  CALCIUM 8.9   Radiological Exams on Admission: DG Chest Port 1 View  Result Date: 09/17/2022 CLINICAL DATA:  sob EXAM: PORTABLE CHEST 1 VIEW COMPARISON:  CXR 06/20/21 FINDINGS: Left-sided dual lead cardiac device in place with unchanged lead positioning. Unchanged cardiac and mediastinal contours. New small right pleural effusion. No pneumothorax. Redemonstrated prominent bilateral interstitial opacities represent mild pulmonary edema. No radiographically apparent displaced rib fractures. Visualized upper abdomen is unremarkable. IMPRESSION: New small right pleural effusion and possible pulmonary venous congestion on background of chronic interstitial lung disease. Electronically Signed   By: Lorenza Cambridge M.D.   On: 09/17/2022 17:12    EKG: Independently reviewed.  Regular, P waves present, rate 75, QTc 461.  No significant change from prior.  Assessment/Plan Principal Problem:   Acute on chronic diastolic CHF (congestive heart failure) (HCC) Active Problems:   Hyponatremia   Acute on chronic hypoxic respiratory failure (HCC)   ILD (interstitial lung disease) (HCC)   Pulmonary embolism (HCC)   Hypertension   Paroxysmal atrial fibrillation (HCC)   Pulmonary hypertension (HCC)  Assessment and Plan: * Acute on chronic diastolic CHF (congestive heart failure) (HCC) 1+ pitting bilateral lower extremity edema, chest x-ray with pulmonary venous congestion, small right pleural effusion.  BNP elevated at 315 from baseline.  With mild increased O2 demands with exertion.  Previously on torsemide daily until about a month ago has been taking it every other day.  Per chart minimal weight gain.  Reports cardiologists recommended her weight should range from 120-125, her weight is 128lbs today.  On Eliquis and compliant.  Last echo 06/2021 EF of 60 to 65%, moderate increased PASP, indeterminate LV diastolic parameters.  -IV Lasix 20 mg x 1 given earlier, start 40  twice daily tonight - Obtain updated echocardiogram -Input output, daily weights, daily BMP -Home torsemide for now  Acute on chronic hypoxic respiratory failure (HCC) On 2.5 L home O2 at baseline, O2 sats dropped to 89% on home O2with exertion, currently on 3 L.   Hyponatremia Sodium 130.  Likely hypervolemic hyponatremia.  Paroxysmal atrial  fibrillation (HCC) Rate controlled and on anticoagulation.  EKG regular, with P waves. -Resume metoprolol, Eliquis  Hypertension Stable. -Resume metoprolol   DVT prophylaxis: Eliquis Code Status: Partial Code.  Patient wants other interventions done except intubation. DO NOT INTUBATE. Family Communication: None at bedside.  Son Tawanna Cooler is HCPOA Disposition Plan: ~ 2 days Consults called: None Admission status:  Obs tele    Author: Onnie Boer, MD 09/17/2022 8:26 PM  For on call review [email or web address].

## 2022-09-17 NOTE — Assessment & Plan Note (Signed)
On 2.5 L home O2 at baseline, O2 sats dropped to 89% on home O2with exertion, currently on 3 L.

## 2022-09-17 NOTE — Assessment & Plan Note (Signed)
Sodium 130.  Likely hypervolemic hyponatremia.

## 2022-09-17 NOTE — ED Triage Notes (Signed)
Pt with SOB for pst 3 days, states her sats drop with ambulation.  Pt is on home O2 at 2.5 L/M. Denies any CP

## 2022-09-17 NOTE — ED Provider Notes (Signed)
[facility] Provider Note   CSN: [id number] Arrival date & time: 09/17/22  1634     History  Chief Complaint  Patient presents with   Shortness of Breath    Cindy [name] is a 89 y.o. female.  Pt is a 89 yo female with pmhx significant for cardiac AL amyloidosis (now in remission), restrictive CM, afib (on eliquis) s/p ablation and DCCV and pacemaker, interstitial lung disease on 2 L oxygen, and hypothyroidism.  Pt has been having increasing sob for the past 3 days.  Her sats have been dropping with ambulation.  Pt denies cp. She's had a cough.  No fever.  Pt has recently stopped her diuretic per doctor rec because she was too dry.         Home Medications Prior to Admission medications   Medication Sig Start Date End Date Taking? Authorizing Provider  acetaminophen (TYLENOL) 650 MG CR tablet Take 1,300 mg by mouth 2 (two) times daily.   Yes [provider]  albuterol (PROVENTIL) (2.5 MG/3ML) 0.083% nebulizer solution Take 2.5 mg by nebulization. 2-3 times a week   Yes [provider]  ALPRAZolam (XANAX) 0.5 MG tablet Take 1 tablet (0.5 mg total) by mouth at bedtime as needed for anxiety. 03/15/13  Yes Alison Murray, MD  apixaban (ELIQUIS) 5 MG TABS tablet Take 5 mg by mouth 2 (two) times daily.   Yes [provider]  BIOTIN PO Take 1 tablet by mouth daily.   Yes [provider]  Cholecalciferol 50 MCG (2000 UT) CAPS Take 1 capsule by mouth daily.   Yes [provider]  cyanocobalamin (VITAMIN B12) 1000 MCG tablet Take 1,000 mcg by mouth daily.   Yes [provider]  diclofenac Sodium (VOLTAREN) 1 % GEL Apply topically.   Yes [provider]  esomeprazole (NEXIUM) 40 MG capsule Take 40 mg by mouth daily. 11/22/21  Yes [provider]  fentaNYL (DURAGESIC) 25 MCG/HR Place 1 patch onto the skin every 3 (three) days. 06/15/19  Yes [provider]   Fluocinolone Acetonide 0.01 % OIL Place 5 drops into both ears as needed (itching). As needed for itching 08/14/21  Yes [provider]  metoprolol succinate (TOPROL-XL) 25 MG 24 hr tablet Take 0.5-1 tablets (12.5-25 mg total) by mouth in the morning and at bedtime. Patient takes 1 tablet (25mg ) in the morning and 1/2 of a tablet (12.5mg ) in the evening Patient taking differently: Take 12.5 mg by mouth daily. 07/10/19  Yes Vassie Loll, MD  Multiple Vitamins-Minerals (PRESERVISION AREDS PO) Take 1 capsule by mouth daily.   Yes [provider]  ondansetron (ZOFRAN) 4 MG tablet Take 4 mg by mouth every 8 (eight) hours as needed. 08/03/19  Yes [provider]  Polyethyl Glycol-Propyl Glycol (SYSTANE OP) Apply 1 drop to eye daily.   Yes [provider]  potassium chloride (KLOR-CON M) 10 MEQ tablet Take 10 mEq by mouth daily. 02/05/22  Yes [provider]  Probiotic Product (ALIGN) 4 MG CAPS Take 1 tablet by mouth every morning.   Yes [provider]  sodium chloride (OCEAN) 0.65 % nasal spray Place 2 sprays into the nose daily as needed.    Yes [provider]  torsemide (DEMADEX) 20 MG tablet Take 20 mg by mouth every other day. 03/28/19  Yes [provider]  ipratropium-albuterol (DUONEB) 0.5-2.5 (3) MG/3ML SOLN Take 3 mLs by nebulization every 4 (four) hours as needed (  Shortness of breath and wheezing). 06/20/21   Vassie Loll, MD  levothyroxine (SYNTHROID) 137 MCG tablet Take 137 mcg by mouth daily. 02/16/22   [provider]  senna (SENOKOT) 8.6 MG tablet Take 2 tablets by mouth as needed for constipation.    [provider]      Allergies    Nebivolol hcl, Codeine, Sulfa antibiotics, and Sulfonamide derivatives    Review of Systems   Review of Systems  Respiratory:  Positive for cough and shortness of breath.   All other systems reviewed and are negative.   Physical Exam Updated Vital Signs BP (!)  144/85 (BP Location: Left Arm)   Pulse 74   Temp 97.8 F (36.6 C) (Oral)   Resp 18   Ht 5\' 7"  (1.702 m)   Wt 58.1 kg   SpO2 100%   BMI 20.05 kg/m  Physical Exam Vitals and nursing note reviewed.  Constitutional:      Appearance: She is well-developed.  HENT:     Head: Normocephalic and atraumatic.     Mouth/Throat:     Mouth: Mucous membranes are moist.     Pharynx: Oropharynx is clear.  Eyes:     Extraocular Movements: Extraocular movements intact.     Pupils: Pupils are equal, round, and reactive to light.  Cardiovascular:     Rate and Rhythm: Normal rate and regular rhythm.  Pulmonary:     Effort: Pulmonary effort is normal.     Breath sounds: Rhonchi present.  Abdominal:     Palpations: Abdomen is soft.  Musculoskeletal:        General: Normal range of motion.     Cervical back: Normal range of motion and neck supple.     Right lower leg: Edema present.     Left lower leg: Edema present.     Comments: Lymphedema is chronic  Skin:    General: Skin is warm.     Capillary Refill: Capillary refill takes less than 2 seconds.  Neurological:     General: No focal deficit present.     Mental Status: She is alert and oriented to person, place, and time.  Psychiatric:        Mood and Affect: Mood normal.        Behavior: Behavior normal.     ED Results / Procedures / Treatments   Labs (all labs ordered are listed, but only abnormal results are displayed) Labs Reviewed  BASIC METABOLIC PANEL - Abnormal; Notable for the following components:      Result Value   Sodium 130 (*)    Chloride 97 (*)    Glucose, Bld 140 (*)    All other components within normal limits  BRAIN NATRIURETIC PEPTIDE - Abnormal; Notable for the following components:   B Natriuretic Peptide 315.0 (*)    All other components within normal limits  CBC WITH DIFFERENTIAL/PLATELET - Abnormal; Notable for the following components:   RBC 3.68 (*)    MCV 100.3 (*)    All other components within  normal limits  SARS CORONAVIRUS 2 BY RT PCR  TROPONIN I (HIGH SENSITIVITY)  TROPONIN I (HIGH SENSITIVITY)    EKG EKG Interpretation  Date/Time:  Thursday Sep 17 2022 16:51:57 EDT Ventricular Rate:  75 PR Interval:    QRS Duration: 102 QT Interval:  412 QTC Calculation: 461 R Axis:   -60 Text Interpretation: Atrial-paced rhythm Inferior infarct, old Anterior infarct, old No acute changes Confirmed by Jacalyn Lefevre [phone]) on 09/17/2022 5:30:58  PM  Radiology DG Chest Port 1 View  Result Date: 09/17/2022 CLINICAL DATA:  sob EXAM: PORTABLE CHEST 1 VIEW COMPARISON:  CXR 06/20/21 FINDINGS: Left-sided dual lead cardiac device in place with unchanged lead positioning. Unchanged cardiac and mediastinal contours. New small right pleural effusion. No pneumothorax. Redemonstrated prominent bilateral interstitial opacities represent mild pulmonary edema. No radiographically apparent displaced rib fractures. Visualized upper abdomen is unremarkable. IMPRESSION: New small right pleural effusion and possible pulmonary venous congestion on background of chronic interstitial lung disease. Electronically Signed   By: Lorenza Cambridge M.D.   On: 09/17/2022 17:12    Procedures Procedures    Medications Ordered in ED Medications  furosemide (LASIX) injection 20 mg (20 mg Intravenous Given 09/17/22 1739)    ED Course/ Medical Decision Making/ A&P                             Medical Decision Making Amount and/or Complexity of Data Reviewed Labs: ordered. Radiology: ordered.  Risk Prescription drug management. Decision regarding hospitalization.   This patient presents to the ED for concern of sob, this involves an extensive number of treatment options, and is a complaint that carries with it a high risk of complications and morbidity.  The differential diagnosis includes copd exac, chf, pna, covid   Co morbidities that complicate the patient evaluation  cardiac AL amyloidosis (now in  remission), restrictive CM, afib (on eliquis) s/p ablation and DCCV and pacemaker, interstitial lung disease on 2 L oxygen, and hypothyroidism   Additional history obtained:  Additional history obtained from epic chart review  Lab Tests:  I Ordered, and personally interpreted labs.  The pertinent results include:  cbc with hgb 12.2 (chronic), bmp with na low at 130 (chronic), bnp elevated at 315 which is the highest that it's been; covid neg, trop nl   Imaging Studies ordered:  I ordered imaging studies including cxr  I independently visualized and interpreted imaging which showed  New small right pleural effusion and possible pulmonary venous  congestion on background of chronic interstitial lung disease.   I agree with the radiologist interpretation   Cardiac Monitoring:  The patient was maintained on a cardiac monitor.  I personally viewed and interpreted the cardiac monitored which showed an underlying rhythm of: paced   Medicines ordered and prescription drug management:  I ordered medication including lasix  for chf  Reevaluation of the patient after these medicines showed that the patient improved I have reviewed the patients home medicines and have made adjustments as needed   Test Considered:  cxr   Critical Interventions:  lasix   Consultations Obtained:  I requested consultation with the hospitalist (Dr. Mariea Clonts),  and discussed lab and imaging findings as well as pertinent plan -she will admit   Problem List / ED Course:  CHF exac:  pt has not been taking her diuretic because she was getting too "dry."  Pt does have CHF on CXR here.  She does drop to 89% with ambulation on her 2L and gets very sob after her IV lasix.  I think she needs an admission for diuresis.   Reevaluation:  After the interventions noted above, I reevaluated the patient and found that they have :improved   Social Determinants of Health:  Lives  alone   Dispostion:  After consideration of the diagnostic results and the patients response to treatment, I feel that the patent would benefit from admission.  Final Clinical Impression(s) / ED Diagnoses Final diagnoses:  Acute on chronic congestive heart failure, unspecified heart failure type [facility])    Rx / DC Orders ED Discharge Orders     None         Jacalyn Lefevre, MD 09/17/22 1918

## 2022-09-17 NOTE — Assessment & Plan Note (Signed)
Stable. -Resume metoprolol 

## 2022-09-17 NOTE — Assessment & Plan Note (Signed)
Rate controlled and on anticoagulation.  EKG regular, with P waves. -Resume metoprolol, Eliquis

## 2022-09-17 NOTE — ED Notes (Signed)
ED TO INPATIENT HANDOFF REPORT  ED Nurse Name and Phone #:  Jess F   S Name/Age/Gender Cindy [name] 89 y.o. female Room/Bed: APA04/APA04  Code Status   Code Status: Prior  Home/SNF/Other Home Patient oriented to: self, place, time, and situation Is this baseline? Yes   Triage Complete: Triage complete  Chief Complaint Acute on chronic hypoxic respiratory failure (HCC) [J96.21]  Triage Note Pt with SOB for pst 3 days, states her sats drop with ambulation.  Pt is on home O2 at 2.5 L/M. Denies any CP   Allergies Allergies  Allergen Reactions   Nebivolol Hcl     Other reaction(s): Other (See Comments) Chest tightness,sob,cough,severe tiredness,confusion   Codeine Nausea Only   Sulfa Antibiotics Nausea Only   Sulfonamide Derivatives Nausea Only    Level of Care/Admitting Diagnosis ED Disposition     ED Disposition  Admit   Condition  --   Comment  Hospital Area: [facility] [[id number]]  Level of Care: Telemetry [5]  Covid Evaluation: Asymptomatic - no recent exposure (last 10 days) testing not required  Diagnosis: Acute on chronic hypoxic respiratory failure [facility]) [[id number]]  Admitting Physician: Onnie Boer [phone]  Attending Physician: Onnie Boer [email or web address]          B Medical/Surgery History Past Medical History:  Diagnosis Date   Amyloidosis (HCC)    cancer   CHF (congestive heart failure) (HCC)    Amyloidosis   Diarrhea    Diverticulosis of colon (without mention of hemorrhage)    Edema    Esophagitis, unspecified    Family history of malignant neoplasm of gastrointestinal tract    Fibrosis of lung (HCC) 03/14/2013   Flatulence, eructation, and gas pain    GERD (gastroesophageal reflux disease)    Left sided ulcerative colitis (HCC)    Macular degeneration    left eye   Maxillary sinus mass    Osteoporosis, unspecified    Other and unspecified hyperlipidemia    Other and unspecified noninfectious gastroenteritis  and colitis(558.9)    Paroxysmal atrial fibrillation (HCC) 07/09/2019   Pneumonia    Stricture and stenosis of esophagus    Unspecified hypothyroidism    URI (upper respiratory infection)    Past Surgical History:  Procedure Laterality Date   ABDOMINAL HYSTERECTOMY     CATARACT EXTRACTION W/PHACO Bilateral    Dr. Nile Riggs   CHOLECYSTECTOMY     THYROIDECTOMY, PARTIAL     YAG LASER APPLICATION Right 12/12/2013   Procedure: YAG LASER APPLICATION;  Surgeon: Loraine Leriche T. Nile Riggs, MD;  Location: [facility];  Service: Ophthalmology;  Laterality: Right;     A IV Location/Drains/Wounds Patient Lines/Drains/Airways Status     Active Line/Drains/Airways     Name Placement date Placement time Site Days   Peripheral IV 09/17/22 20 G 1" Anterior;Distal;Right Forearm 09/17/22  1704  Forearm  less than 1            Intake/Output Last 24 hours  Intake/Output Summary (Last 24 hours) at 09/17/2022 1931 Last data filed at 09/17/2022 1920 Gross per 24 hour  Intake --  Output 700 ml  Net -700 ml    Labs/Imaging Results for orders placed or performed during the hospital encounter of 09/17/22 (from the past 48 hour(s))  Basic metabolic panel     Status: Abnormal   Collection Time: 09/17/22  5:00 PM  Result Value Ref Range   Sodium 130 (L) 135 - 145 mmol/L   Potassium 3.8 3.5 - 5.1 mmol/L  Chloride 97 (L) 98 - 111 mmol/L   CO2 23 22 - 32 mmol/L   Glucose, Bld 140 (H) 70 - 99 mg/dL    Comment: Glucose reference range applies only to samples taken after fasting for at least 8 hours.   BUN 12 8 - 23 mg/dL   Creatinine, Ser 1.61 0.44 - 1.00 mg/dL   Calcium 8.9 8.9 - 09.6 mg/dL   GFR, Estimated >04 >54 mL/min    Comment: (NOTE) Calculated using the CKD-EPI Creatinine Equation (2021)    Anion gap 10 5 - 15    Comment: Performed at [facility], [address], [place], [place] [address]  Brain natriuretic peptide     Status: Abnormal   Collection Time: 09/17/22  5:00 PM  Result Value Ref Range    B Natriuretic Peptide 315.0 (H) 0.0 - 100.0 pg/mL    Comment: Performed at [facility], [address], [place], [place] [address]  CBC with Differential     Status: Abnormal   Collection Time: 09/17/22  5:00 PM  Result Value Ref Range   WBC 5.7 4.0 - 10.5 K/uL   RBC 3.68 (L) 3.87 - 5.11 MIL/uL   Hemoglobin 12.2 12.0 - 15.0 g/dL   HCT 29.5 62.1 - 30.8 %   MCV 100.3 (H) 80.0 - 100.0 fL   MCH 33.2 26.0 - 34.0 pg   MCHC 33.1 30.0 - 36.0 g/dL   RDW 65.7 84.6 - 96.2 %   Platelets 214 150 - 400 K/uL   nRBC 0.0 0.0 - 0.2 %   Neutrophils Relative % 74 %   Neutro Abs 4.2 1.7 - 7.7 K/uL   Lymphocytes Relative 15 %   Lymphs Abs 0.9 0.7 - 4.0 K/uL   Monocytes Relative 7 %   Monocytes Absolute 0.4 0.1 - 1.0 K/uL   Eosinophils Relative 3 %   Eosinophils Absolute 0.2 0.0 - 0.5 K/uL   Basophils Relative 1 %   Basophils Absolute 0.1 0.0 - 0.1 K/uL   Immature Granulocytes 0 %   Abs Immature Granulocytes 0.02 0.00 - 0.07 K/uL    Comment: Performed at [facility], [address], [place], [place] [address]  Troponin I (High Sensitivity)     Status: None   Collection Time: 09/17/22  5:00 PM  Result Value Ref Range   Troponin I (High Sensitivity) 7 <18 ng/L    Comment: (NOTE) Elevated high sensitivity troponin I (hsTnI) values and significant  changes across serial measurements may suggest ACS but many other  chronic and acute conditions are known to elevate hsTnI results.  Refer to the "Links" section for chest pain algorithms and additional  guidance. Performed at [facility], [address], [place], [place] [address]   SARS Coronavirus 2 by RT PCR (hospital order, performed in [facility] hospital lab) *cepheid single result test* Anterior Nasal Swab     Status: None   Collection Time: 09/17/22  5:07 PM   Specimen: Anterior Nasal Swab  Result Value Ref Range   SARS Coronavirus 2 by RT PCR NEGATIVE NEGATIVE    Comment: (NOTE) SARS-CoV-2 target nucleic acids are NOT DETECTED.  The  SARS-CoV-2 RNA is generally detectable in upper and lower respiratory specimens during the acute phase of infection. The lowest concentration of SARS-CoV-2 viral copies this assay can detect is 250 copies / mL. A negative result does not preclude SARS-CoV-2 infection and should not be used as the sole basis for treatment or other patient management decisions.  A  negative result may occur with improper specimen collection / handling, submission of specimen other than nasopharyngeal swab, presence of viral mutation(s) within the areas targeted by this assay, and inadequate number of viral copies (<250 copies / mL). A negative result must be combined with clinical observations, patient history, and epidemiological information.  Fact Sheet for Patients:   RoadLapTop.co.za  Fact Sheet for Healthcare Providers: http://kim-miller.com/  This test is not yet approved or  cleared by the [place] FDA and has been authorized for detection and/or diagnosis of SARS-CoV-2 by FDA under an Emergency Use Authorization (EUA).  This EUA will remain in effect (meaning this test can be used) for the duration of the COVID-19 declaration under Section 564(b)(1) of the Act, 21 U.S.C. section 360bbb-3(b)(1), unless the authorization is terminated or revoked sooner.  Performed at [facility], [address] [place], [place] [address]   Troponin I (High Sensitivity)     Status: None   Collection Time: 09/17/22  6:42 PM  Result Value Ref Range   Troponin I (High Sensitivity) 7 <18 ng/L    Comment: (NOTE) Elevated high sensitivity troponin I (hsTnI) values and significant  changes across serial measurements may suggest ACS but many other  chronic and acute conditions are known to elevate hsTnI results.  Refer to the "Links" section for chest pain algorithms and additional  guidance. Performed at [facility], [address] [place], [place] [address]    DG  Chest Port 1 View  Result Date: 09/17/2022 CLINICAL DATA:  sob EXAM: PORTABLE CHEST 1 VIEW COMPARISON:  CXR 06/20/21 FINDINGS: Left-sided dual lead cardiac device in place with unchanged lead positioning. Unchanged cardiac and mediastinal contours. New small right pleural effusion. No pneumothorax. Redemonstrated prominent bilateral interstitial opacities represent mild pulmonary edema. No radiographically apparent displaced rib fractures. Visualized upper abdomen is unremarkable. IMPRESSION: New small right pleural effusion and possible pulmonary venous congestion on background of chronic interstitial lung disease. Electronically Signed   By: Lorenza Cambridge M.D.   On: 09/17/2022 17:12    Pending Labs Unresulted Labs (From admission, onward)    None       Vitals/Pain Today's Vitals   09/17/22 1730 09/17/22 1800 09/17/22 1835 09/17/22 1920  BP: 116/67 124/68 (!) 144/85   Pulse: 78 75 74   Resp: 20 19 18    Temp:   97.8 F (36.6 C)   TempSrc:   Oral   SpO2: 100% 98% 100%   Weight:      Height:      PainSc:    0-No pain    Isolation Precautions No active isolations  Medications Medications  furosemide (LASIX) injection 20 mg (20 mg Intravenous Given 09/17/22 1739)    Mobility walks     Focused Assessments Pulmonary Assessment Handoff:  Lung sounds:   O2 Device: Nasal Cannula O2 Flow Rate (L/min): 2 L/min    R Recommendations: See Admitting Provider Note  Report given to:   Additional Notes: n/a

## 2022-09-17 NOTE — ED Notes (Signed)
Bedside commode placed at bedside with a measuring hat. Informed pt to call with assistance to the bedside.

## 2022-09-17 NOTE — ED Notes (Signed)
Pt ambulated approximately 50 ft to the restroom and back to room. Pt oxygen saturation went from 98% on 3L of oxygen to 89% on 3L of oxygen. Pt appeared to be more SOB while ambulating. EDP notified.

## 2022-09-17 NOTE — Assessment & Plan Note (Addendum)
1+ pitting bilateral lower extremity edema, chest x-ray with pulmonary venous congestion, small right pleural effusion.  BNP elevated at 315 from baseline.  With mild increased O2 demands with exertion.  Previously on torsemide daily until about a month ago has been taking it every other day.  Per chart minimal weight gain.  Reports cardiologists recommended her weight should range from 120-125, her weight is 128lbs today.  On Eliquis and compliant.  Last echo 06/2021 EF of 60 to 65%, moderate increased PASP, indeterminate LV diastolic parameters.  -IV Lasix 20 mg x 1 given earlier, start 40 twice daily tonight - Obtain updated echocardiogram -Input output, daily weights, daily BMP -Home torsemide for now

## 2022-09-18 ENCOUNTER — Observation Stay (HOSPITAL_BASED_OUTPATIENT_CLINIC_OR_DEPARTMENT_OTHER): Payer: Medicare Other

## 2022-09-18 DIAGNOSIS — I5033 Acute on chronic diastolic (congestive) heart failure: Secondary | ICD-10-CM | POA: Diagnosis not present

## 2022-09-18 DIAGNOSIS — E871 Hypo-osmolality and hyponatremia: Secondary | ICD-10-CM | POA: Diagnosis present

## 2022-09-18 DIAGNOSIS — E785 Hyperlipidemia, unspecified: Secondary | ICD-10-CM | POA: Diagnosis present

## 2022-09-18 DIAGNOSIS — Z1152 Encounter for screening for COVID-19: Secondary | ICD-10-CM | POA: Diagnosis not present

## 2022-09-18 DIAGNOSIS — I110 Hypertensive heart disease with heart failure: Secondary | ICD-10-CM | POA: Diagnosis present

## 2022-09-18 DIAGNOSIS — J8410 Pulmonary fibrosis, unspecified: Secondary | ICD-10-CM | POA: Diagnosis present

## 2022-09-18 DIAGNOSIS — Z66 Do not resuscitate: Secondary | ICD-10-CM | POA: Diagnosis present

## 2022-09-18 DIAGNOSIS — Z885 Allergy status to narcotic agent status: Secondary | ICD-10-CM | POA: Diagnosis not present

## 2022-09-18 DIAGNOSIS — J849 Interstitial pulmonary disease, unspecified: Secondary | ICD-10-CM | POA: Diagnosis not present

## 2022-09-18 DIAGNOSIS — Y92009 Unspecified place in unspecified non-institutional (private) residence as the place of occurrence of the external cause: Secondary | ICD-10-CM | POA: Diagnosis not present

## 2022-09-18 DIAGNOSIS — J449 Chronic obstructive pulmonary disease, unspecified: Secondary | ICD-10-CM | POA: Diagnosis present

## 2022-09-18 DIAGNOSIS — Z950 Presence of cardiac pacemaker: Secondary | ICD-10-CM | POA: Diagnosis not present

## 2022-09-18 DIAGNOSIS — Z7901 Long term (current) use of anticoagulants: Secondary | ICD-10-CM | POA: Diagnosis not present

## 2022-09-18 DIAGNOSIS — Z8249 Family history of ischemic heart disease and other diseases of the circulatory system: Secondary | ICD-10-CM | POA: Diagnosis not present

## 2022-09-18 DIAGNOSIS — I442 Atrioventricular block, complete: Secondary | ICD-10-CM | POA: Diagnosis present

## 2022-09-18 DIAGNOSIS — Z882 Allergy status to sulfonamides status: Secondary | ICD-10-CM | POA: Diagnosis not present

## 2022-09-18 DIAGNOSIS — I425 Other restrictive cardiomyopathy: Secondary | ICD-10-CM | POA: Diagnosis present

## 2022-09-18 DIAGNOSIS — E854 Organ-limited amyloidosis: Secondary | ICD-10-CM | POA: Diagnosis present

## 2022-09-18 DIAGNOSIS — E8581 Light chain (AL) amyloidosis: Secondary | ICD-10-CM | POA: Diagnosis present

## 2022-09-18 DIAGNOSIS — I272 Pulmonary hypertension, unspecified: Secondary | ICD-10-CM

## 2022-09-18 DIAGNOSIS — I43 Cardiomyopathy in diseases classified elsewhere: Secondary | ICD-10-CM | POA: Diagnosis present

## 2022-09-18 DIAGNOSIS — J9621 Acute and chronic respiratory failure with hypoxia: Secondary | ICD-10-CM | POA: Diagnosis present

## 2022-09-18 DIAGNOSIS — I509 Heart failure, unspecified: Secondary | ICD-10-CM | POA: Diagnosis present

## 2022-09-18 DIAGNOSIS — Z79899 Other long term (current) drug therapy: Secondary | ICD-10-CM | POA: Diagnosis not present

## 2022-09-18 DIAGNOSIS — E039 Hypothyroidism, unspecified: Secondary | ICD-10-CM | POA: Diagnosis present

## 2022-09-18 DIAGNOSIS — Z86711 Personal history of pulmonary embolism: Secondary | ICD-10-CM | POA: Diagnosis not present

## 2022-09-18 DIAGNOSIS — I480 Paroxysmal atrial fibrillation: Secondary | ICD-10-CM | POA: Diagnosis present

## 2022-09-18 LAB — ECHOCARDIOGRAM COMPLETE
AR max vel: 2.93 cm2
AV Area VTI: 2.42 cm2
AV Area mean vel: 2.8 cm2
AV Mean grad: 1.9 mmHg
AV Peak grad: 3.7 mmHg
Ao pk vel: 0.96 m/s
Area-P 1/2: 3.99 cm2
Height: 67 in
MV VTI: 1.55 cm2
S' Lateral: 2.4 cm
Single Plane A2C EF: 68.2 %
Weight: 1985.9 oz

## 2022-09-18 LAB — BASIC METABOLIC PANEL
Anion gap: 9 (ref 5–15)
BUN: 10 mg/dL (ref 8–23)
CO2: 29 mmol/L (ref 22–32)
Calcium: 8.4 mg/dL — ABNORMAL LOW (ref 8.9–10.3)
Chloride: 96 mmol/L — ABNORMAL LOW (ref 98–111)
Creatinine, Ser: 0.63 mg/dL (ref 0.44–1.00)
GFR, Estimated: >60 mL/min (ref >60)
Glucose, Bld: 91 mg/dL (ref 70–99)
Potassium: 3.5 mmol/L (ref 3.5–5.1)
Sodium: 134 mmol/L — ABNORMAL LOW (ref 135–145)

## 2022-09-18 MED ORDER — SIMETHICONE 80 MG PO CHEW
80.0000 mg | CHEWABLE_TABLET | Freq: Four times a day (QID) | ORAL | Status: DC | PRN
  Administered 2022-09-18: 80 mg via ORAL
  Filled 2022-09-18: qty 1

## 2022-09-18 MED ORDER — FENTANYL 25 MCG/HR TD PT72
1.0000 | MEDICATED_PATCH | TRANSDERMAL | Status: DC
  Administered 2022-09-19: 1 via TRANSDERMAL
  Filled 2022-09-18: qty 1

## 2022-09-18 NOTE — Progress Notes (Signed)
  Echocardiogram 2D Echocardiogram has been performed.  Cindy [name] 09/18/2022, 9:12 AM

## 2022-09-18 NOTE — Progress Notes (Signed)
Patient admitted this shift no complaints of pain. Patient slept through the shift. Continued to monitor.

## 2022-09-18 NOTE — TOC CM/SW Note (Signed)
Transition of Care [facility]) - Inpatient Brief Assessment   Patient Details  Name: Cindy [name] MRN: [id number] Date of Birth: Jan 27, 1933  Transition of Care [facility]) CM/SW Contact:    Elliot Gault, LCSW Phone Number: 09/18/2022, 12:01 PM   Clinical Narrative:  Transition of Care Department [facility]) has reviewed patient and no TOC needs have been identified at this time. We will continue to monitor patient advancement through interdisciplinary progression rounds. If new patient transition needs arise, please place a TOC consult.  Transition of Care Asessment: Insurance and Status: Insurance coverage has been reviewed Patient has primary care physician: Yes Home environment has been reviewed: from home with spouse Prior level of function:: independent Prior/Current Home Services: No current home services Social Determinants of Health Reivew: SDOH reviewed no interventions necessary Readmission risk has been reviewed: Yes Transition of care needs: no transition of care needs at this time

## 2022-09-18 NOTE — Hospital Course (Addendum)
89 year old female with a history of diastolic CHF, chronic respiratory failure on 2.5 L, cardiac amyloidosis (in remission), pulmonary hypertension, paroxysmal atrial fibrillation, third-degree AV block status post PPM and pulmonary emboli presenting with 1 week hx history of shortness of breath, worsen in past 2 days.  She notes increasing weight up to 128 pounds at home.  She is noted increasing lower extremity edema.  She denies any fevers, chills, chest pain, nausea, vomiting, diarrhea, abdominal pain.  She states that she has been taking torsemide 20 mg every other day.  She relates that one of her providers had told her to hold off on taking torsemide, but she had refused.  Nevertheless, the patient states that " I drink plenty of fluid to keep hydrated".  She denies any other new medications.  The patient's chronic respiratory failure and dyspnea has been felt to be multifactorial including her cardiac amyloidosis, ILD, and pulmonary hypertension.  She was previously followed by [facility] pulmonology, but now follows Dr. Vassie Loll.  It was felt that her hypoxia was likely secondary to pulmonary hypertension exacerbated by her PEs (06/2019 and 07/2020) and possible postinflammatory scarring from chronic aspiration.  Her last high-resolution CT of the chest on 12/08/2021 did not show any significant ILD. She is followed by [facility] cardiology.  During her last appointment on 06/23/2022, her torsemide was decreased to every other day due to trending up of her serum creatinine, and her metoprolol succinate was decreased to 12.5 mg daily secondary to soft blood pressures.  However, there was low threshold to increase her torsemide back to daily for her chronic lower extremity edema.  She had a telemedicine visit on 08/13/2022 with 2 cardiology at which point she was told to continue her torsemide 20 mg every other day and continue onto metoprolol succinate 12.5 mg daily.  In the ED, the patient was afebrile hemodynamically  stable. she was placed on 2 L and ambulated O2 sats dropped to 89%.  Chest x-ray showed venous congestion with new right pleural effusion.  WBC 5.7, hemoglobin 12.2, platelets 214,000.  EKG showed atrial paced rhythm.  Troponin 7>> 7.  COVID-19 PCR was negative.  BNP was 315.  Sodium 130, potassium 3.8, bicarbonate 23, serum creatinine 0.68. The patient was given 20 mg IV furosemide in the ED, and received an additional 40 mg IV when she arrived to the floor.

## 2022-09-18 NOTE — Care Management Obs Status (Signed)
MEDICARE OBSERVATION STATUS NOTIFICATION   Patient Details  Name: Cindy [name] MRN: [id number] Date of Birth: May 17, 1932   Medicare Observation Status Notification Given:  Yes    Elliot Gault, LCSW 09/18/2022, 1:08 PM

## 2022-09-18 NOTE — Progress Notes (Signed)
PROGRESS NOTE  Cindy [name] ZOX:[id number] DOB: Jul 28, 1932 DOA: 09/17/2022 PCP: Benita Stabile, MD  Brief History:  89 year old female with a history of diastolic CHF, chronic respiratory failure on 2.5 L, cardiac amyloidosis (in remission), pulmonary hypertension, paroxysmal atrial fibrillation, third-degree AV block status post PPM and pulmonary emboli presenting with 1 week hx history of shortness of breath, worsen in past 2 days.  She notes increasing weight up to 128 pounds at home.  She is noted increasing lower extremity edema.  She denies any fevers, chills, chest pain, nausea, vomiting, diarrhea, abdominal pain.  She states that she has been taking torsemide 20 mg every other day.  She relates that one of her providers had told her to hold off on taking torsemide, but she had refused.  Nevertheless, the patient states that " I drink plenty of fluid to keep hydrated".  She denies any other new medications.  The patient's chronic respiratory failure and dyspnea has been felt to be multifactorial including her cardiac amyloidosis, ILD, and pulmonary hypertension.  She was previously followed by [facility] pulmonology, but now follows Dr. Vassie Loll.  It was felt that her hypoxia was likely secondary to pulmonary hypertension exacerbated by her PEs (06/2019 and 07/2020) and possible postinflammatory scarring from chronic aspiration.  Her last high-resolution CT of the chest on 12/08/2021 did not show any significant ILD. She is followed by [facility] cardiology.  During her last appointment on 06/23/2022, her torsemide was decreased to every other day due to trending up of her serum creatinine, and her metoprolol succinate was decreased to 12.5 mg daily secondary to soft blood pressures.  However, there was low threshold to increase her torsemide back to daily for her chronic lower extremity edema.  She had a telemedicine visit on 08/13/2022 with 2 cardiology at which point she was told to continue her torsemide  20 mg every other day and continue onto metoprolol succinate 12.5 mg daily.  In the ED, the patient was afebrile hemodynamically stable. she was placed on 2 L and ambulated O2 sats dropped to 89%.  Chest x-ray showed venous congestion with new right pleural effusion.  WBC 5.7, hemoglobin 12.2, platelets 214,000.  EKG showed atrial paced rhythm.  Troponin 7>> 7.  COVID-19 PCR was negative.  BNP was 315.  Sodium 130, potassium 3.8, bicarbonate 23, serum creatinine 0.68. The patient was given 20 mg IV furosemide in the ED, and received an additional 40 mg IV when she arrived to the floor.   Assessment/Plan: Acute on chronic respiratory failure with hypoxia -Chronically on 2.5 L at home -Secondary to decompensated CHF in the setting of pulmonary hypertension, ILD, and cardiac amyloidosis -06/23/2022 weight in the cardiology office 123 pounds -Admission weight 128 pounds -06/19/2021 echo EF 60 to 65%, no WMA, moderate TR, PASP 50.8  Acute on chronic HFpEF -06/23/2022 weight in the cardiology office 123 pounds -Admission weight 128 pounds -06/19/2021 echo EF 60 to 65%, no WMA, moderate TR, PASP 50.8 -Continue IV furosemide -Daily weights -Echo to assess -ReDS vest reading -Continue metoprolol succinate -repeat echo  Hyponatremia -Secondary to volume overload -Improving with diuresis  Paroxysmal atrial fibrillation -Continue apixaban -EKG shows atrial paced rhythm -Status post ablation and DCCV  History of recurrent PEs -Continue apixaban  Third-degree AV block -Status post PPM 03/2017  Pulmonary hypertension/ILD -12/08/2021 CT high-res--irregular patchy curvilinear bandlike consolidations in the bilateral posterior lower lobes, right greater than left.  Bandlike consolidation decreased since 08/16/2020;  favor bland  nonspecific postinfectious/postinflammatory scarring -Follow-up with pulmonary, Dr. Vassie Loll  Lambda light chain amyloid cardiomyopathy -Patient follows with [facility] amyloid clinic  as well as [facility] cardiology -Currently in remission -Status post treatment with Isatuximab--last treated 2020      Family Communication:  son updated 5/31  Consultants:  none  Code Status:  DNI  DVT Prophylaxis:apixaban   Procedures: As Listed in Progress Note Above  Antibiotics: None       Subjective:  Patient feels that her breathing is better this morning.  She denies any chest pain, nausea, vomiting or direct abdominal pain, fevers, chills. Objective: Vitals:   09/17/22 1930 09/18/22 0100 09/18/22 0426 09/18/22 0430  BP: 126/68 131/85  124/75  Pulse: 75 80  75  Resp: 19 18  19   Temp:  98.6 F (37 C)  98.5 F (36.9 C)  TempSrc:  Oral  Oral  SpO2: 97% 100%  100%  Weight:   56.3 kg   Height:        Intake/Output Summary (Last 24 hours) at 09/18/2022 0725 Last data filed at 09/18/2022 1610 Gross per 24 hour  Intake --  Output 2500 ml  Net -2500 ml   Weight change:  Exam:  General:  Pt is alert, follows commands appropriately, not in acute distress HEENT: No icterus, No thrush, No neck mass, [place]/AT Cardiovascular: RRR, S1/S2, no rubs, no gallops Respiratory: Bibasilar crackles. No wheeze Abdomen: Soft/+BS, non tender, non distended, no guarding Extremities: 1 + LE edema, No lymphangitis, No petechiae, No rashes, no synovitis   Data Reviewed: I have personally reviewed following labs and imaging studies Basic Metabolic Panel: Recent Labs  Lab 09/17/22 1700 09/18/22 0511  NA 130* 134*  K 3.8 3.5  CL 97* 96*  CO2 23 29  GLUCOSE 140* 91  BUN 12 10  CREATININE 0.68 0.63  CALCIUM 8.9 8.4*   Liver Function Tests: No results for input(s): "AST", "ALT", "ALKPHOS", "BILITOT", "PROT", "ALBUMIN" in the last 168 hours. No results for input(s): "LIPASE", "AMYLASE" in the last 168 hours. No results for input(s): "AMMONIA" in the last 168 hours. Coagulation Profile: No results for input(s): "INR", "PROTIME" in the last 168 hours. CBC: Recent Labs   Lab 09/17/22 1700  WBC 5.7  NEUTROABS 4.2  HGB 12.2  HCT 36.9  MCV 100.3*  PLT 214   Cardiac Enzymes: No results for input(s): "CKTOTAL", "CKMB", "CKMBINDEX", "TROPONINI" in the last 168 hours. BNP: Invalid input(s): "POCBNP" CBG: No results for input(s): "GLUCAP" in the last 168 hours. HbA1C: No results for input(s): "HGBA1C" in the last 72 hours. Urine analysis:    Component Value Date/Time   COLORURINE YELLOW 08/16/2020 1137   APPEARANCEUR CLEAR 08/16/2020 1137   LABSPEC 1.040 (H) 08/16/2020 1137   PHURINE 6.0 08/16/2020 1137   GLUCOSEU NEGATIVE 08/16/2020 1137   HGBUR NEGATIVE 08/16/2020 1137   BILIRUBINUR NEGATIVE 08/16/2020 1137   KETONESUR NEGATIVE 08/16/2020 1137   PROTEINUR NEGATIVE 08/16/2020 1137   UROBILINOGEN 0.2 03/13/2013 2208   NITRITE NEGATIVE 08/16/2020 1137   LEUKOCYTESUR SMALL (A) 08/16/2020 1137   Sepsis Labs: @LABRCNTIP (procalcitonin:4,lacticidven:4) ) Recent Results (from the past 240 hour(s))  SARS Coronavirus 2 by RT PCR (hospital order, performed in [facility] hospital lab) *cepheid single result test* Anterior Nasal Swab     Status: None   Collection Time: 09/17/22  5:07 PM   Specimen: Anterior Nasal Swab  Result Value Ref Range Status   SARS Coronavirus 2 by RT PCR NEGATIVE NEGATIVE Final  Comment: (NOTE) SARS-CoV-2 target nucleic acids are NOT DETECTED.  The SARS-CoV-2 RNA is generally detectable in upper and lower respiratory specimens during the acute phase of infection. The lowest concentration of SARS-CoV-2 viral copies this assay can detect is 250 copies / mL. A negative result does not preclude SARS-CoV-2 infection and should not be used as the sole basis for treatment or other patient management decisions.  A negative result may occur with improper specimen collection / handling, submission of specimen other than nasopharyngeal swab, presence of viral mutation(s) within the areas targeted by this assay, and inadequate  number of viral copies (<250 copies / mL). A negative result must be combined with clinical observations, patient history, and epidemiological information.  Fact Sheet for Patients:   RoadLapTop.co.za  Fact Sheet for Healthcare Providers: http://kim-miller.com/  This test is not yet approved or  cleared by the [place] FDA and has been authorized for detection and/or diagnosis of SARS-CoV-2 by FDA under an Emergency Use Authorization (EUA).  This EUA will remain in effect (meaning this test can be used) for the duration of the COVID-19 declaration under Section 564(b)(1) of the Act, 21 U.S.C. section 360bbb-3(b)(1), unless the authorization is terminated or revoked sooner.  Performed at [facility], [address] [place], [place] [address]      Scheduled Meds:  apixaban  5 mg Oral BID   furosemide  40 mg Intravenous Q12H   metoprolol succinate  12.5 mg Oral Daily   Continuous Infusions:  Procedures/Studies: DG Chest Port 1 View  Result Date: 09/17/2022 CLINICAL DATA:  sob EXAM: PORTABLE CHEST 1 VIEW COMPARISON:  CXR 06/20/21 FINDINGS: Left-sided dual lead cardiac device in place with unchanged lead positioning. Unchanged cardiac and mediastinal contours. New small right pleural effusion. No pneumothorax. Redemonstrated prominent bilateral interstitial opacities represent mild pulmonary edema. No radiographically apparent displaced rib fractures. Visualized upper abdomen is unremarkable. IMPRESSION: New small right pleural effusion and possible pulmonary venous congestion on background of chronic interstitial lung disease. Electronically Signed   By: Lorenza Cambridge M.D.   On: 09/17/2022 17:12    Catarina Hartshorn, DO  [facility]  If 7PM-7AM, please contact night-coverage [email or web address] Password [email or web address] 09/18/2022, 7:25 AM   LOS: 0 days

## 2022-09-19 DIAGNOSIS — J849 Interstitial pulmonary disease, unspecified: Secondary | ICD-10-CM | POA: Diagnosis not present

## 2022-09-19 DIAGNOSIS — J9621 Acute and chronic respiratory failure with hypoxia: Secondary | ICD-10-CM | POA: Diagnosis not present

## 2022-09-19 DIAGNOSIS — I5033 Acute on chronic diastolic (congestive) heart failure: Secondary | ICD-10-CM | POA: Diagnosis not present

## 2022-09-19 DIAGNOSIS — E871 Hypo-osmolality and hyponatremia: Secondary | ICD-10-CM | POA: Diagnosis not present

## 2022-09-19 LAB — BASIC METABOLIC PANEL
Anion gap: 9 (ref 5–15)
BUN: 11 mg/dL (ref 8–23)
CO2: 29 mmol/L (ref 22–32)
Calcium: 8.2 mg/dL — ABNORMAL LOW (ref 8.9–10.3)
Chloride: 96 mmol/L — ABNORMAL LOW (ref 98–111)
Creatinine, Ser: 0.63 mg/dL (ref 0.44–1.00)
GFR, Estimated: >60 mL/min (ref >60)
Glucose, Bld: 89 mg/dL (ref 70–99)
Potassium: 3.4 mmol/L — ABNORMAL LOW (ref 3.5–5.1)
Sodium: 134 mmol/L — ABNORMAL LOW (ref 135–145)

## 2022-09-19 LAB — MAGNESIUM: Magnesium: 1.7 mg/dL (ref 1.7–2.4)

## 2022-09-19 MED ORDER — POTASSIUM CHLORIDE CRYS ER 20 MEQ PO TBCR
40.0000 meq | EXTENDED_RELEASE_TABLET | Freq: Once | ORAL | Status: AC
  Administered 2022-09-19: 40 meq via ORAL
  Filled 2022-09-19: qty 2

## 2022-09-19 MED ORDER — TORSEMIDE 20 MG PO TABS: 20.0000 mg | ORAL_TABLET | Freq: Every day | ORAL | Status: DC

## 2022-09-19 NOTE — Progress Notes (Signed)
Patient discharged home with instructions given on medications and follow up visits, patient verbalized understanding. IV discontinued,catheter intact. Accompanied by staff to an awaiting vehicle.. 

## 2022-09-19 NOTE — Discharge Summary (Signed)
Physician Discharge Summary   Patient: Cindy [name] MRN: [id number] DOB: 1933-03-09  Admit date:     09/17/2022  Discharge date: 09/19/22  Discharge Physician: Onalee Hua Zabria Liss   PCP: Benita Stabile, MD   Recommendations at discharge:   Please follow up with primary care provider within 1-2 weeks  Please repeat BMP and CBC in one week     Hospital Course: 89 year old female with a history of diastolic CHF, chronic respiratory failure on 2.5 L, cardiac amyloidosis (in remission), pulmonary hypertension, paroxysmal atrial fibrillation, third-degree AV block status post PPM and pulmonary emboli presenting with 1 week hx history of shortness of breath, worsen in past 2 days.  She notes increasing weight up to 128 pounds at home.  She is noted increasing lower extremity edema.  She denies any fevers, chills, chest pain, nausea, vomiting, diarrhea, abdominal pain.  She states that she has been taking torsemide 20 mg every other day.  She relates that one of her providers had told her to hold off on taking torsemide, but she had refused.  Nevertheless, the patient states that " I drink plenty of fluid to keep hydrated".  She denies any other new medications.  The patient's chronic respiratory failure and dyspnea has been felt to be multifactorial including her cardiac amyloidosis, ILD, and pulmonary hypertension.  She was previously followed by [facility] pulmonology, but now follows Dr. Vassie Loll.  It was felt that her hypoxia was likely secondary to pulmonary hypertension exacerbated by her PEs (06/2019 and 07/2020) and possible postinflammatory scarring from chronic aspiration.  Her last high-resolution CT of the chest on 12/08/2021 did not show any significant ILD. She is followed by [facility] cardiology.  During her last appointment on 06/23/2022, her torsemide was decreased to every other day due to trending up of her serum creatinine, and her metoprolol succinate was decreased to 12.5 mg daily secondary to soft blood  pressures.  However, there was low threshold to increase her torsemide back to daily for her chronic lower extremity edema.  She had a telemedicine visit on 08/13/2022 with 2 cardiology at which point she was told to continue her torsemide 20 mg every other day and continue onto metoprolol succinate 12.5 mg daily.  In the ED, the patient was afebrile hemodynamically stable. she was placed on 2 L and ambulated O2 sats dropped to 89%.  Chest x-ray showed venous congestion with new right pleural effusion.  WBC 5.7, hemoglobin 12.2, platelets 214,000.  EKG showed atrial paced rhythm.  Troponin 7>> 7.  COVID-19 PCR was negative.  BNP was 315.  Sodium 130, potassium 3.8, bicarbonate 23, serum creatinine 0.68. The patient was given 20 mg IV furosemide in the ED, and received an additional 40 mg IV when she arrived to the floor.  Assessment and Plan: Acute on chronic respiratory failure with hypoxia -Chronically on 2.5 L at home -Secondary to decompensated CHF in the setting of pulmonary hypertension, ILD, and cardiac amyloidosis -06/23/2022 weight in the cardiology office 123 pounds -Admission weight 128 pounds -06/19/2021 echo EF 60 to 65%, no WMA, moderate TR, PASP 50.8 -discharge weght 118.4 lbs -ReDS vest reading  = 29 on day of dc   Acute on chronic HFpEF -06/23/2022 weight in the cardiology office 123 pounds -Admission weight 128 pounds -06/19/2021 echo EF 60 to 65%, no WMA, moderate TR, PASP 50.8 -Continue IV furosemide -Daily weights -5/31 Echo--EF 60-65%, no WMA, moderate TR, PASP 46.8 -ReDS vest reading = 29 -Continue metoprolol succinate -discharge weght 118.4 lbs -  d/c home with torsemide 20 mg daily   Hyponatremia -Secondary to volume overload -Improving with diuresis -130>>134   Paroxysmal atrial fibrillation -Continue apixaban -EKG shows atrial paced rhythm -Status post ablation and DCCV   History of recurrent PEs -Continue apixaban   Third-degree AV block -Status post PPM  03/2017   Pulmonary hypertension/ILD -12/08/2021 CT high-res--irregular patchy curvilinear bandlike consolidations in the bilateral posterior lower lobes, right greater than left.  Bandlike consolidation decreased since 08/16/2020; favor bland  nonspecific postinfectious/postinflammatory scarring -Follow-up with pulmonary, Dr. Vassie Loll   Lambda light chain amyloid cardiomyopathy -Patient follows with [facility] amyloid clinic as well as [facility] cardiology -Currently in remission -Status post treatment with Isatuximab--last treated 2020       Consultants: none Procedures performed: none  Disposition: Home Diet recommendation:  Cardiac diet DISCHARGE MEDICATION: Allergies as of 09/19/2022       Reactions   Nebivolol Hcl    Other reaction(s): Other (See Comments) Chest tightness,sob,cough,severe tiredness,confusion   Codeine Nausea Only   Sulfa Antibiotics Nausea Only   Sulfonamide Derivatives Nausea Only        Medication List     TAKE these medications    acetaminophen 650 MG CR tablet Commonly known as: TYLENOL Take 1,300 mg by mouth 2 (two) times daily.   albuterol (2.5 MG/3ML) 0.083% nebulizer solution Commonly known as: PROVENTIL Take 2.5 mg by nebulization. 2-3 times a week   Align 4 MG Caps Take 1 tablet by mouth every morning.   ALPRAZolam 0.5 MG tablet Commonly known as: Xanax Take 1 tablet (0.5 mg total) by mouth at bedtime as needed for anxiety.   apixaban 5 MG Tabs tablet Commonly known as: ELIQUIS Take 5 mg by mouth 2 (two) times daily.   BIOTIN PO Take 1 tablet by mouth daily.   Cholecalciferol 50 MCG (2000 UT) Caps Take 1 capsule by mouth daily.   cyanocobalamin 1000 MCG tablet Commonly known as: VITAMIN B12 Take 1,000 mcg by mouth daily.   diclofenac Sodium 1 % Gel Commonly known as: VOLTAREN Apply topically.   esomeprazole 40 MG capsule Commonly known as: NEXIUM Take 40 mg by mouth daily.   fentaNYL 25 MCG/HR Commonly known as:  DURAGESIC Place 1 patch onto the skin every 3 (three) days.   Fluocinolone Acetonide 0.01 % Oil Place 5 drops into both ears as needed (itching). As needed for itching   ipratropium-albuterol 0.5-2.5 (3) MG/3ML Soln Commonly known as: DUONEB Take 3 mLs by nebulization every 4 (four) hours as needed (Shortness of breath and wheezing).   levothyroxine 137 MCG tablet Commonly known as: SYNTHROID Take 137 mcg by mouth daily.   metoprolol succinate 25 MG 24 hr tablet Commonly known as: TOPROL-XL Take 0.5-1 tablets (12.5-25 mg total) by mouth in the morning and at bedtime. Patient takes 1 tablet (25mg ) in the morning and 1/2 of a tablet (12.5mg ) in the evening What changed:  how much to take when to take this additional instructions   ondansetron 4 MG tablet Commonly known as: ZOFRAN Take 4 mg by mouth every 8 (eight) hours as needed.   potassium chloride 10 MEQ tablet Commonly known as: KLOR-CON M Take 10 mEq by mouth daily.   PRESERVISION AREDS PO Take 1 capsule by mouth daily.   senna 8.6 MG tablet Commonly known as: SENOKOT Take 2 tablets by mouth as needed for constipation.   sodium chloride 0.65 % nasal spray Commonly known as: OCEAN Place 2 sprays into the nose daily as needed.  SYSTANE OP Apply 1 drop to eye daily.   torsemide 20 MG tablet Commonly known as: DEMADEX Take 1 tablet (20 mg total) by mouth daily. What changed: when to take this        Discharge Exam: Filed Weights   09/17/22 1644 09/18/22 0426 09/19/22 0400  Weight: 58.1 kg 56.3 kg 53.7 kg   HEENT:  /AT, No thrush, no icterus CV:  RRR, no rub, no S3, no S4 Lung:  bibasilar crackles. No wheeze Abd:  soft/+BS, NT Ext:  No edema, no lymphangitis, no synovitis, no rash   Condition at discharge: stable  The results of significant diagnostics from this hospitalization (including imaging, microbiology, ancillary and laboratory) are listed below for reference.   Imaging  Studies: ECHOCARDIOGRAM COMPLETE  Result Date: 09/18/2022    ECHOCARDIOGRAM REPORT   Patient Name:   Cindy [name] Date of Exam: 09/18/2022 Medical Rec #:  [id number]          Height:       67.0 in Accession #:    [id number]         Weight:       124.1 lb Date of Birth:  1932-05-21          BSA:          1.651 m Patient Age:    89 years           BP:           124/75 mmHg Patient Gender: F                  HR:           81 bpm. [facility] Procedure: 2D Echo, Cardiac Doppler and Color Doppler Indications:    I50.40* Unspecified combined systolic (congestive) and diastolic                 (congestive) heart failure  History:        Patient has prior history of Echocardiogram examinations, most                 recent 06/20/2021. Abnormal ECG and Pacemaker, COPD,                 Arrythmias:Atrial Fibrillation, Signs/Symptoms:Chest Pain,                 Shortness of Breath and Dyspnea; Risk Factors:Hypertension.                 Pulmonary fibrosis. ILD. Light Chain Amyloidosis. Pulmonary                 embolus.  Sonographer:    Sheralyn Boatman RDCS Referring Phys: [id number] Heloise Beecham [facility]  Sonographer Comments: Technically difficult study due to poor echo windows. Patient complained of pain with pressure from probe. Could not make adequate contact for optimal images. IMPRESSIONS  1. Left ventricular ejection fraction, by estimation, is 60 to 65%. The left ventricle has normal function. The left ventricle has no regional wall motion abnormalities. There is mild left ventricular hypertrophy. Left ventricular diastolic parameters are indeterminate.  2. RV poorly visualized. Grossly appears enlarged with decreased systolic function. . Right ventricular systolic function was not well visualized. The right ventricular size is not well visualized. There is moderately elevated pulmonary artery systolic pressure.  3. The mitral valve is normal in structure. Trivial mitral valve regurgitation. No evidence of mitral  stenosis.  4. The tricuspid valve is abnormal. Tricuspid valve  regurgitation is moderate.  5. The aortic valve is tricuspid. There is mild calcification of the aortic valve. There is mild thickening of the aortic valve. Aortic valve regurgitation is not visualized. No aortic stenosis is present.  6. The inferior vena cava is normal in size with greater than 50% respiratory variability, suggesting right atrial pressure of 3 mmHg. FINDINGS  Left Ventricle: Left ventricular ejection fraction, by estimation, is 60 to 65%. The left ventricle has normal function. The left ventricle has no regional wall motion abnormalities. The left ventricular internal cavity size was normal in size. There is  mild left ventricular hypertrophy. Left ventricular diastolic parameters are indeterminate. Right Ventricle: RV poorly visualized. Grossly appears enlarged with decreased systolic function. The right ventricular size is not well visualized. Right vetricular wall thickness was not well visualized. Right ventricular systolic function was not well  visualized. There is moderately elevated pulmonary artery systolic pressure. The tricuspid regurgitant velocity is 3.31 m/s, and with an assumed right atrial pressure of 3 mmHg, the estimated right ventricular systolic pressure is 46.8 mmHg. Left Atrium: Left atrial size was normal in size. Right Atrium: Right atrial size was not well visualized. Pericardium: There is no evidence of pericardial effusion. Mitral Valve: The mitral valve is normal in structure. Trivial mitral valve regurgitation. No evidence of mitral valve stenosis. MV peak gradient, 8.2 mmHg. The mean mitral valve gradient is 3.0 mmHg. Tricuspid Valve: The tricuspid valve is abnormal. Tricuspid valve regurgitation is moderate . No evidence of tricuspid stenosis. Aortic Valve: The aortic valve is tricuspid. There is mild calcification of the aortic valve. There is mild thickening of the aortic valve. There is mild aortic  valve annular calcification. Aortic valve regurgitation is not visualized. No aortic stenosis  is present. Aortic valve mean gradient measures 1.9 mmHg. Aortic valve peak gradient measures 3.7 mmHg. Aortic valve area, by VTI measures 2.42 cm. Pulmonic Valve: The pulmonic valve was not well visualized. Pulmonic valve regurgitation is mild. No evidence of pulmonic stenosis. Aorta: The aortic root is normal in size and structure. Venous: The inferior vena cava is normal in size with greater than 50% respiratory variability, suggesting right atrial pressure of 3 mmHg. IAS/Shunts: No atrial level shunt detected by color flow Doppler.  LEFT VENTRICLE PLAX 2D LVIDd:         3.40 cm     Diastology LVIDs:         2.40 cm     LV e' medial:    5.77 cm/s LV PW:         1.20 cm     LV E/e' medial:  22.4 LV IVS:        1.20 cm     LV e' lateral:   6.58 cm/s LVOT diam:     2.10 cm     LV E/e' lateral: 19.6 LV SV:         46 LV SV Index:   28 LVOT Area:     3.46 cm  LV Volumes (MOD) LV vol d, MOD A2C: 48.5 ml LV vol s, MOD A2C: 15.4 ml LV SV MOD A2C:     33.1 ml RIGHT VENTRICLE             IVC RV S prime:     13.20 cm/s  IVC diam: 2.40 cm TAPSE (M-mode): 1.3 cm LEFT ATRIUM             Index        RIGHT ATRIUM  Index LA diam:        2.60 cm 1.57 cm/m   RA Area:     20.70 cm LA Vol (A2C):   18.7 ml 11.33 ml/m  RA Volume:   57.00 ml  34.52 ml/m LA Vol (A4C):   17.6 ml 10.66 ml/m LA Biplane Vol: 19.4 ml 11.75 ml/m  AORTIC VALVE                    PULMONIC VALVE AV Area (Vmax):    2.93 cm     PR End Diast Vel: 1.35 msec AV Area (Vmean):   2.80 cm AV Area (VTI):     2.42 cm AV Vmax:           95.56 cm/s AV Vmean:          63.646 cm/s AV VTI:            0.190 m AV Peak Grad:      3.7 mmHg AV Mean Grad:      1.9 mmHg LVOT Vmax:         80.80 cm/s LVOT Vmean:        51.400 cm/s LVOT VTI:          0.133 m LVOT/AV VTI ratio: 0.70  AORTA Ao Root diam: 3.30 cm Ao Asc diam:  3.10 cm MITRAL VALVE                TRICUSPID  VALVE MV Area (PHT): 3.99 cm     TR Peak grad:   43.8 mmHg MV Area VTI:   1.55 cm     TR Vmax:        331.00 cm/s MV Peak grad:  8.2 mmHg MV Mean grad:  3.0 mmHg     SHUNTS MV Vmax:       1.43 m/s     Systemic VTI:  0.13 m MV Vmean:      70.7 cm/s    Systemic Diam: 2.10 cm MV Decel Time: 190 msec MV E velocity: 129.00 cm/s MV A velocity: 34.00 cm/s MV E/A ratio:  3.79 Dina Rich MD Electronically signed by Dina Rich MD Signature Date/Time: 09/18/2022/12:21:28 PM    Final    DG Chest Port 1 View  Result Date: 09/17/2022 CLINICAL DATA:  sob EXAM: PORTABLE CHEST 1 VIEW COMPARISON:  CXR 06/20/21 FINDINGS: Left-sided dual lead cardiac device in place with unchanged lead positioning. Unchanged cardiac and mediastinal contours. New small right pleural effusion. No pneumothorax. Redemonstrated prominent bilateral interstitial opacities represent mild pulmonary edema. No radiographically apparent displaced rib fractures. Visualized upper abdomen is unremarkable. IMPRESSION: New small right pleural effusion and possible pulmonary venous congestion on background of chronic interstitial lung disease. Electronically Signed   By: Lorenza Cambridge M.D.   On: 09/17/2022 17:12    Microbiology: Results for orders placed or performed during the hospital encounter of 09/17/22  SARS Coronavirus 2 by RT PCR (hospital order, performed in [facility] hospital lab) *cepheid single result test* Anterior Nasal Swab     Status: None   Collection Time: 09/17/22  5:07 PM   Specimen: Anterior Nasal Swab  Result Value Ref Range Status   SARS Coronavirus 2 by RT PCR NEGATIVE NEGATIVE Final    Comment: (NOTE) SARS-CoV-2 target nucleic acids are NOT DETECTED.  The SARS-CoV-2 RNA is generally detectable in upper and lower respiratory specimens during the acute phase of infection. The lowest concentration of SARS-CoV-2 viral copies this assay can detect is 250 copies /  mL. A negative result does not preclude SARS-CoV-2  infection and should not be used as the sole basis for treatment or other patient management decisions.  A negative result may occur with improper specimen collection / handling, submission of specimen other than nasopharyngeal swab, presence of viral mutation(s) within the areas targeted by this assay, and inadequate number of viral copies (<250 copies / mL). A negative result must be combined with clinical observations, patient history, and epidemiological information.  Fact Sheet for Patients:   RoadLapTop.co.za  Fact Sheet for Healthcare Providers: http://kim-miller.com/  This test is not yet approved or  cleared by the [place] FDA and has been authorized for detection and/or diagnosis of SARS-CoV-2 by FDA under an Emergency Use Authorization (EUA).  This EUA will remain in effect (meaning this test can be used) for the duration of the COVID-19 declaration under Section 564(b)(1) of the Act, 21 U.S.C. section 360bbb-3(b)(1), unless the authorization is terminated or revoked sooner.  Performed at [facility], [address], [place], [place] [address]     Labs: CBC: Recent Labs  Lab 09/17/22 1700  WBC 5.7  NEUTROABS 4.2  HGB 12.2  HCT 36.9  MCV 100.3*  PLT 214   Basic Metabolic Panel: Recent Labs  Lab 09/17/22 1700 09/18/22 0511 09/19/22 0445  NA 130* 134* 134*  K 3.8 3.5 3.4*  CL 97* 96* 96*  CO2 23 29 29   GLUCOSE 140* 91 89  BUN 12 10 11   CREATININE 0.68 0.63 0.63  CALCIUM 8.9 8.4* 8.2*  MG  --   --  1.7   Liver Function Tests: No results for input(s): "AST", "ALT", "ALKPHOS", "BILITOT", "PROT", "ALBUMIN" in the last 168 hours. CBG: No results for input(s): "GLUCAP" in the last 168 hours.  Discharge time spent: greater than 30 minutes.  Signed: Catarina Hartshorn, MD [facility] 09/19/2022

## 2022-09-19 NOTE — Progress Notes (Signed)
   09/19/22 0958  ReDS Vest / Clip  Station Marker C  Ruler Value 26  ReDS Value Range < 36  ReDS Actual Value 29

## 2022-09-22 DIAGNOSIS — H3321 Serous retinal detachment, right eye: Secondary | ICD-10-CM | POA: Diagnosis not present

## 2022-09-22 DIAGNOSIS — H26492 Other secondary cataract, left eye: Secondary | ICD-10-CM | POA: Diagnosis not present

## 2022-09-22 DIAGNOSIS — H353133 Nonexudative age-related macular degeneration, bilateral, advanced atrophic without subfoveal involvement: Secondary | ICD-10-CM | POA: Diagnosis not present

## 2022-09-22 DIAGNOSIS — H353221 Exudative age-related macular degeneration, left eye, with active choroidal neovascularization: Secondary | ICD-10-CM | POA: Diagnosis not present

## 2022-09-22 DIAGNOSIS — H353211 Exudative age-related macular degeneration, right eye, with active choroidal neovascularization: Secondary | ICD-10-CM | POA: Diagnosis not present

## 2022-09-22 DIAGNOSIS — H35352 Cystoid macular degeneration, left eye: Secondary | ICD-10-CM | POA: Diagnosis not present

## 2022-09-22 DIAGNOSIS — H43812 Vitreous degeneration, left eye: Secondary | ICD-10-CM | POA: Diagnosis not present

## 2022-09-24 DIAGNOSIS — D808 Other immunodeficiencies with predominantly antibody defects: Secondary | ICD-10-CM | POA: Diagnosis not present

## 2022-09-24 DIAGNOSIS — E871 Hypo-osmolality and hyponatremia: Secondary | ICD-10-CM | POA: Diagnosis not present

## 2022-09-24 DIAGNOSIS — I2699 Other pulmonary embolism without acute cor pulmonale: Secondary | ICD-10-CM | POA: Diagnosis not present

## 2022-09-24 DIAGNOSIS — I272 Pulmonary hypertension, unspecified: Secondary | ICD-10-CM | POA: Diagnosis not present

## 2022-09-24 DIAGNOSIS — I509 Heart failure, unspecified: Secondary | ICD-10-CM | POA: Diagnosis not present

## 2022-09-28 DIAGNOSIS — R309 Painful micturition, unspecified: Secondary | ICD-10-CM | POA: Diagnosis not present

## 2022-09-28 DIAGNOSIS — R300 Dysuria: Secondary | ICD-10-CM | POA: Diagnosis not present

## 2022-09-28 DIAGNOSIS — N390 Urinary tract infection, site not specified: Secondary | ICD-10-CM | POA: Diagnosis not present

## 2022-09-29 DIAGNOSIS — R309 Painful micturition, unspecified: Secondary | ICD-10-CM | POA: Diagnosis not present

## 2022-10-01 DIAGNOSIS — H353211 Exudative age-related macular degeneration, right eye, with active choroidal neovascularization: Secondary | ICD-10-CM | POA: Diagnosis not present

## 2022-10-01 DIAGNOSIS — H353221 Exudative age-related macular degeneration, left eye, with active choroidal neovascularization: Secondary | ICD-10-CM | POA: Diagnosis not present

## 2022-10-01 DIAGNOSIS — H35352 Cystoid macular degeneration, left eye: Secondary | ICD-10-CM | POA: Diagnosis not present

## 2022-10-01 DIAGNOSIS — H353133 Nonexudative age-related macular degeneration, bilateral, advanced atrophic without subfoveal involvement: Secondary | ICD-10-CM | POA: Diagnosis not present

## 2022-10-01 DIAGNOSIS — H43812 Vitreous degeneration, left eye: Secondary | ICD-10-CM | POA: Diagnosis not present

## 2022-10-01 DIAGNOSIS — H3321 Serous retinal detachment, right eye: Secondary | ICD-10-CM | POA: Diagnosis not present

## 2022-10-08 ENCOUNTER — Ambulatory Visit (HOSPITAL_COMMUNITY)
Admission: RE | Admit: 2022-10-08 | Discharge: 2022-10-08 | Disposition: A | Discharge status: Home / Self Care | Payer: Medicare Other | Source: Ambulatory Visit | Attending: Pulmonary Disease | Admitting: Pulmonary Disease

## 2022-10-08 DIAGNOSIS — R300 Dysuria: Secondary | ICD-10-CM | POA: Diagnosis not present

## 2022-10-08 DIAGNOSIS — R309 Painful micturition, unspecified: Secondary | ICD-10-CM | POA: Diagnosis not present

## 2022-10-08 DIAGNOSIS — R918 Other nonspecific abnormal finding of lung field: Secondary | ICD-10-CM | POA: Diagnosis not present

## 2022-10-08 DIAGNOSIS — J849 Interstitial pulmonary disease, unspecified: Secondary | ICD-10-CM | POA: Diagnosis not present

## 2022-10-08 DIAGNOSIS — J181 Lobar pneumonia, unspecified organism: Secondary | ICD-10-CM | POA: Diagnosis not present

## 2022-10-08 DIAGNOSIS — J90 Pleural effusion, not elsewhere classified: Secondary | ICD-10-CM | POA: Diagnosis not present

## 2022-10-12 DIAGNOSIS — H6121 Impacted cerumen, right ear: Secondary | ICD-10-CM | POA: Diagnosis not present

## 2022-10-16 ENCOUNTER — Telehealth: Payer: Self-pay | Admitting: *Deleted

## 2022-10-16 NOTE — Telephone Encounter (Signed)
Patient would like to discuss results of CT scan from 10/08/22. Please call and advise patient. [phone]

## 2022-10-16 NOTE — Telephone Encounter (Signed)
Called and spoke w/ pt - she verbalized understanding of RA message NFN att. 

## 2022-10-28 DIAGNOSIS — Z713 Dietary counseling and surveillance: Secondary | ICD-10-CM | POA: Diagnosis not present

## 2022-10-28 DIAGNOSIS — N390 Urinary tract infection, site not specified: Secondary | ICD-10-CM | POA: Diagnosis not present

## 2022-10-28 DIAGNOSIS — Z681 Body mass index (BMI) 19 or less, adult: Secondary | ICD-10-CM | POA: Diagnosis not present

## 2022-10-29 DIAGNOSIS — I43 Cardiomyopathy in diseases classified elsewhere: Secondary | ICD-10-CM | POA: Diagnosis not present

## 2022-10-29 DIAGNOSIS — I429 Cardiomyopathy, unspecified: Secondary | ICD-10-CM | POA: Diagnosis not present

## 2022-10-29 DIAGNOSIS — I5030 Unspecified diastolic (congestive) heart failure: Secondary | ICD-10-CM | POA: Diagnosis not present

## 2022-10-29 DIAGNOSIS — I491 Atrial premature depolarization: Secondary | ICD-10-CM | POA: Diagnosis not present

## 2022-10-29 DIAGNOSIS — R002 Palpitations: Secondary | ICD-10-CM | POA: Diagnosis not present

## 2022-10-29 DIAGNOSIS — R42 Dizziness and giddiness: Secondary | ICD-10-CM | POA: Diagnosis not present

## 2022-10-29 DIAGNOSIS — Z45018 Encounter for adjustment and management of other part of cardiac pacemaker: Secondary | ICD-10-CM | POA: Diagnosis not present

## 2022-10-29 DIAGNOSIS — I442 Atrioventricular block, complete: Secondary | ICD-10-CM | POA: Diagnosis not present

## 2022-10-29 DIAGNOSIS — I480 Paroxysmal atrial fibrillation: Secondary | ICD-10-CM | POA: Diagnosis not present

## 2022-10-29 DIAGNOSIS — I484 Atypical atrial flutter: Secondary | ICD-10-CM | POA: Diagnosis not present

## 2022-10-29 DIAGNOSIS — Z7901 Long term (current) use of anticoagulants: Secondary | ICD-10-CM | POA: Diagnosis not present

## 2022-11-02 DIAGNOSIS — Z950 Presence of cardiac pacemaker: Secondary | ICD-10-CM | POA: Diagnosis not present

## 2022-11-02 DIAGNOSIS — J849 Interstitial pulmonary disease, unspecified: Secondary | ICD-10-CM | POA: Diagnosis not present

## 2022-11-02 DIAGNOSIS — I495 Sick sinus syndrome: Secondary | ICD-10-CM | POA: Diagnosis not present

## 2022-11-02 DIAGNOSIS — I5030 Unspecified diastolic (congestive) heart failure: Secondary | ICD-10-CM | POA: Diagnosis not present

## 2022-11-02 DIAGNOSIS — I480 Paroxysmal atrial fibrillation: Secondary | ICD-10-CM | POA: Diagnosis not present

## 2022-11-12 DIAGNOSIS — E039 Hypothyroidism, unspecified: Secondary | ICD-10-CM | POA: Diagnosis not present

## 2022-11-12 DIAGNOSIS — R7303 Prediabetes: Secondary | ICD-10-CM | POA: Diagnosis not present

## 2022-11-12 DIAGNOSIS — E782 Mixed hyperlipidemia: Secondary | ICD-10-CM | POA: Diagnosis not present

## 2022-11-16 DIAGNOSIS — I4891 Unspecified atrial fibrillation: Secondary | ICD-10-CM | POA: Diagnosis not present

## 2022-11-16 DIAGNOSIS — E860 Dehydration: Secondary | ICD-10-CM | POA: Diagnosis not present

## 2022-11-16 DIAGNOSIS — D808 Other immunodeficiencies with predominantly antibody defects: Secondary | ICD-10-CM | POA: Diagnosis not present

## 2022-11-16 DIAGNOSIS — I272 Pulmonary hypertension, unspecified: Secondary | ICD-10-CM | POA: Diagnosis not present

## 2022-11-16 DIAGNOSIS — J9601 Acute respiratory failure with hypoxia: Secondary | ICD-10-CM | POA: Diagnosis not present

## 2022-11-16 DIAGNOSIS — I509 Heart failure, unspecified: Secondary | ICD-10-CM | POA: Diagnosis not present

## 2022-11-16 DIAGNOSIS — J849 Interstitial pulmonary disease, unspecified: Secondary | ICD-10-CM | POA: Diagnosis not present

## 2022-11-16 DIAGNOSIS — R600 Localized edema: Secondary | ICD-10-CM | POA: Diagnosis not present

## 2022-11-16 DIAGNOSIS — R110 Nausea: Secondary | ICD-10-CM | POA: Diagnosis not present

## 2022-11-16 DIAGNOSIS — I890 Lymphedema, not elsewhere classified: Secondary | ICD-10-CM | POA: Diagnosis not present

## 2022-11-16 DIAGNOSIS — N760 Acute vaginitis: Secondary | ICD-10-CM | POA: Diagnosis not present

## 2022-11-16 DIAGNOSIS — E871 Hypo-osmolality and hyponatremia: Secondary | ICD-10-CM | POA: Diagnosis not present

## 2022-11-17 DIAGNOSIS — H353221 Exudative age-related macular degeneration, left eye, with active choroidal neovascularization: Secondary | ICD-10-CM | POA: Diagnosis not present

## 2022-11-17 DIAGNOSIS — H353211 Exudative age-related macular degeneration, right eye, with active choroidal neovascularization: Secondary | ICD-10-CM | POA: Diagnosis not present

## 2022-11-17 DIAGNOSIS — H3321 Serous retinal detachment, right eye: Secondary | ICD-10-CM | POA: Diagnosis not present

## 2022-11-17 DIAGNOSIS — H35352 Cystoid macular degeneration, left eye: Secondary | ICD-10-CM | POA: Diagnosis not present

## 2022-11-17 DIAGNOSIS — H43812 Vitreous degeneration, left eye: Secondary | ICD-10-CM | POA: Diagnosis not present

## 2022-11-17 DIAGNOSIS — H353133 Nonexudative age-related macular degeneration, bilateral, advanced atrophic without subfoveal involvement: Secondary | ICD-10-CM | POA: Diagnosis not present

## 2022-11-27 ENCOUNTER — Telehealth: Payer: Self-pay

## 2022-11-27 NOTE — Telephone Encounter (Signed)
Patient call about referral. Patient is made aware that our office just received referral today and someone will reach out to her to scheduled her appointment. Patient states she is having burning with urination, patient is advised to go to the ED if symptoms persisted. Patient voiced understanding

## 2022-12-01 DIAGNOSIS — H353211 Exudative age-related macular degeneration, right eye, with active choroidal neovascularization: Secondary | ICD-10-CM | POA: Diagnosis not present

## 2022-12-01 DIAGNOSIS — H353221 Exudative age-related macular degeneration, left eye, with active choroidal neovascularization: Secondary | ICD-10-CM | POA: Diagnosis not present

## 2022-12-01 DIAGNOSIS — H3321 Serous retinal detachment, right eye: Secondary | ICD-10-CM | POA: Diagnosis not present

## 2022-12-01 DIAGNOSIS — H353133 Nonexudative age-related macular degeneration, bilateral, advanced atrophic without subfoveal involvement: Secondary | ICD-10-CM | POA: Diagnosis not present

## 2022-12-01 DIAGNOSIS — H43812 Vitreous degeneration, left eye: Secondary | ICD-10-CM | POA: Diagnosis not present

## 2022-12-01 DIAGNOSIS — H35352 Cystoid macular degeneration, left eye: Secondary | ICD-10-CM | POA: Diagnosis not present

## 2022-12-17 DIAGNOSIS — L820 Inflamed seborrheic keratosis: Secondary | ICD-10-CM | POA: Diagnosis not present

## 2022-12-17 DIAGNOSIS — Z1283 Encounter for screening for malignant neoplasm of skin: Secondary | ICD-10-CM | POA: Diagnosis not present

## 2022-12-17 DIAGNOSIS — I872 Venous insufficiency (chronic) (peripheral): Secondary | ICD-10-CM | POA: Diagnosis not present

## 2022-12-17 DIAGNOSIS — L298 Other pruritus: Secondary | ICD-10-CM | POA: Diagnosis not present

## 2022-12-17 DIAGNOSIS — D225 Melanocytic nevi of trunk: Secondary | ICD-10-CM | POA: Diagnosis not present

## 2022-12-18 DIAGNOSIS — U071 COVID-19: Secondary | ICD-10-CM | POA: Diagnosis not present

## 2022-12-25 ENCOUNTER — Ambulatory Visit (INDEPENDENT_AMBULATORY_CARE_PROVIDER_SITE_OTHER): Payer: Medicare Other | Admitting: Pulmonary Disease

## 2022-12-25 ENCOUNTER — Encounter: Payer: Self-pay | Admitting: Pulmonary Disease

## 2022-12-25 VITALS — BP 129/73 | HR 98 | Ht 67.0 in | Wt 130.6 lb

## 2022-12-25 DIAGNOSIS — I5032 Chronic diastolic (congestive) heart failure: Secondary | ICD-10-CM

## 2022-12-25 DIAGNOSIS — I272 Pulmonary hypertension, unspecified: Secondary | ICD-10-CM | POA: Diagnosis not present

## 2022-12-25 NOTE — Progress Notes (Signed)
Subjective:    Patient ID: Cindy [name], female    DOB: 02-05-1933, 89 y.o.   MRN: [id number]  HPI  89 yo never smoker with cardiac amyloid and pulmonary hypertension for FU ofchronic hypoxic respiratory failure. Per last evaluation at [facility] pulmonology , ILD was stable and unlikely to be pulmonary amyloid,?  Sequelae of aspiration was possible due to patulous esophagus.  Dyspnea was attributed to pulmonary hypertension 6-minute walk was about 200 m   Previous follow-up with [facility] cardiology- Dr Cindy [name], [facility] amyloid clinic Dr. Leeanne [name], previous pulmonary evaluation by Dr. Dudley [name] at [facility] and my partner Dr. Sherene [name] She was diagnosed with cardiac amyloid in 2016 and was on hospice for a while with right Pleurx catheter but now has been remarkably stable for many years, and off medication since 2020.  Remains on fentanyl patch. she was diagnosed with pulmonary emboli in 2021 & 2022 and is on lifetime anticoagulation She has chronic lower extremity edema since 2021 due to right heart failure and lymphedema     PMH  - HFpEF - Afib s/p ablation and DCCV (2016), AV block requiring dual lead pacemaker (03/2017), - Cardiac amyloid not on current medications, with preserved EF but restricitive CM/ restrictive LV filling pattern -being followed by [facility] --> 2016 - bortezomid/cytoxan and VITAL study --> 2019/2020 - Isatuximab, stopped per patient - Pulmonary embolism x2 --> Acute PE 08/16/20 in RUL and RML and in 07/08/2019 in the right main pulm artery and LUL - Macular degeneration -lymphedema -remote h/o ulcerative colitis  73-month follow-up visit She was diagnosed with COVID infection few days ago had blood-tinged sputum.  PCP gave her prednisone and antibiotic.  She has improved and except for a mild cough now almost back to baseline. Weight has increased gradually to 130 pounds about 5 lbs up. BUN/creatinine 6/24 was 11/0.6. She reports chronic bipedal edema   Significant  tests/ events reviewed   Amb sat 06/2021 >> desat to 93% on walking at slow pace   Echo 06/2022 at [facility] showed moderate TR but normal RV function   Echo 06/2021 RVSP elevated at 51 mm 12/2020 TTE at [facility] ,estimated RVSP  50 mmHg, mod TR, gr 2 DD   12/2020 VQ neg    HRCT chest 09/2022 >> Chronic patchy centrilobular micronodularity and faint patchy GGO throughout both lungs, unchanged, differential includes plexogenic pulmonary arteriopathy from chronic pulmonary hypertension with superimposed mild pulmonary edema, versus chronic hypersensitivity pneumonitis. Stable small loculated basilar right pleural effusion. Stable thick bandlike consolidation in the posterior lungs, right greater than left, favoring bland postinfectious scarring   HRCT chest 11/2021 bandlike consolidation has decreased bilaterally, stable small Rt effusion loculated   HRCT 11/2020  Mild peripheral reticulation in left upper lobe and superior segment of left lower lobe and posterior right upper lobe, may represent early interstitial lung abnormalities vs sequelae of prior aspiration.  Curvilinear scarring in right lower lobe.   Cardiomegaly and dilated main pulmonary artery measuring 3.5 cm, may represent elevated pulmonary arterial pressures.  Small right pleural effusion.   PFTs  - pfts 04/04/14  VC  2.10 (66%) no airflow obst   DLC0 48%   12/16/2020 FVC (% Pred) 2.05 (76.5) FEV1 (% Pred)  1.69 (84.5) DLCO  38.6% , TLC 88%  6 Minute Walk:  159m, O2 nadir 91% on room air   Review of Systems neg for any significant sore throat, dysphagia, itching, sneezing, nasal congestion or excess/ purulent secretions, fever, chills, sweats, unintended wt  loss, pleuritic or exertional cp, hempoptysis, orthopnea pnd or change in chronic leg swelling. Also denies presyncope, palpitations, heartburn, abdominal pain, nausea, vomiting, diarrhea or change in bowel or urinary habits, dysuria,hematuria, rash, arthralgias, visual complaints,  headache, numbness weakness or ataxia.     Objective:   Physical Exam  Gen. Pleasant, thin frail elderly, in no distress ENT - no thrush, no pallor/icterus, masked Neck: No JVD, no thyromegaly, no carotid bruits Lungs: no use of accessory muscles, no dullness to percussion, rales rt base Cardiovascular: Rhythm regular, heart sounds  normal, no murmurs or gallops, 2+ peripheral edema Musculoskeletal: No deformities, no cyanosis or clubbing        Assessment & Plan:   HRCT once again clarifies that she does not have significant ILD with benign postinflammatory scarring.  Of greater concern is possibility of interstitial edema due to diastolic heart failure severe pulmonary hypertension. She is recovering from COVID infection & mild hemoptysis is likely related to apixaban

## 2022-12-25 NOTE — Assessment & Plan Note (Signed)
I have asked her to increase torsemide to twice daily every other day for about a week, to create a weight loss of 3 to 4 pounds-aim for baseline weight of 125

## 2022-12-25 NOTE — Assessment & Plan Note (Signed)
She does not have significant ILD decreased DLCO is likely on the basis of pulmonary hypertension which is WHO 2. Not a candidate for vasodilator therapy

## 2022-12-25 NOTE — Patient Instructions (Signed)
Take an extra fluid pill every other day x 1 week

## 2022-12-28 ENCOUNTER — Ambulatory Visit: Payer: Medicare Other | Admitting: Urology

## 2023-01-13 DIAGNOSIS — H43812 Vitreous degeneration, left eye: Secondary | ICD-10-CM | POA: Diagnosis not present

## 2023-01-13 DIAGNOSIS — H35352 Cystoid macular degeneration, left eye: Secondary | ICD-10-CM | POA: Diagnosis not present

## 2023-01-13 DIAGNOSIS — H3321 Serous retinal detachment, right eye: Secondary | ICD-10-CM | POA: Diagnosis not present

## 2023-01-13 DIAGNOSIS — H353133 Nonexudative age-related macular degeneration, bilateral, advanced atrophic without subfoveal involvement: Secondary | ICD-10-CM | POA: Diagnosis not present

## 2023-01-13 DIAGNOSIS — H353211 Exudative age-related macular degeneration, right eye, with active choroidal neovascularization: Secondary | ICD-10-CM | POA: Diagnosis not present

## 2023-01-13 DIAGNOSIS — H353221 Exudative age-related macular degeneration, left eye, with active choroidal neovascularization: Secondary | ICD-10-CM | POA: Diagnosis not present

## 2023-01-25 ENCOUNTER — Encounter: Payer: Self-pay | Admitting: Urology

## 2023-01-25 ENCOUNTER — Ambulatory Visit (INDEPENDENT_AMBULATORY_CARE_PROVIDER_SITE_OTHER): Payer: Medicare Other | Admitting: Urology

## 2023-01-25 VITALS — BP 128/78 | HR 89

## 2023-01-25 DIAGNOSIS — R300 Dysuria: Secondary | ICD-10-CM | POA: Diagnosis not present

## 2023-01-25 LAB — MICROSCOPIC EXAMINATION

## 2023-01-25 LAB — URINALYSIS, ROUTINE W REFLEX MICROSCOPIC
Bilirubin, UA: NEGATIVE
Glucose, UA: NEGATIVE
Ketones, UA: NEGATIVE
Nitrite, UA: POSITIVE — AB
Protein,UA: NEGATIVE
RBC, UA: NEGATIVE
Specific Gravity, UA: 1.01 (ref 1.005–1.030)
Urobilinogen, Ur: 1 mg/dL (ref 0.2–1.0)
pH, UA: 7 (ref 5.0–7.5)

## 2023-01-25 NOTE — Progress Notes (Unsigned)
post void residual=323

## 2023-01-25 NOTE — Progress Notes (Unsigned)
01/25/2023 11:13 AM   Cindy [name] Cindy [name] 04-Jul-1932 [id number]  Referring provider: Benita Stabile, MD [address] [place],  [place] [address]  No chief complaint on file.   HPI:  New patient-  1) dysuria, UTI - she had dysuria and "UTI". Abx helped but dysuria continued. She is "red" around her vagina. It is manageable. No gross hematuria. No GU surgery. She takes Librarian, academic.   She has intermittent flow. She has some frequency and urgency. She is on fluid pills.   No vaginal or uterine or breast cancer. S/p Hx. She has a h/o DVT. On eliquis. 2022 [place] with no DVT. No h/o radiation. She does have a fentanyl patch. She has h/o chemotherapy. 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 Managed for 8 yrs.   She is followed by [facility] pulmonology for ILD.    PMH: Past Medical History:  Diagnosis Date   Amyloidosis (HCC)    cancer   CHF (congestive heart failure) (HCC)    Amyloidosis   Diarrhea    Diverticulosis of colon (without mention of hemorrhage)    Edema    Esophagitis, unspecified    Family history of malignant neoplasm of gastrointestinal tract    Fibrosis of lung (HCC) 03/14/2013   Flatulence, eructation, and gas pain    GERD (gastroesophageal reflux disease)    Left sided ulcerative colitis (HCC)    Macular degeneration    left eye   Maxillary sinus mass    Osteoporosis, unspecified    Other and unspecified hyperlipidemia    Other and unspecified noninfectious gastroenteritis and colitis(558.9)    Paroxysmal atrial fibrillation (HCC) 07/09/2019   Pneumonia    Stricture and stenosis of esophagus    Unspecified hypothyroidism    URI (upper respiratory infection)     Surgical History: Past Surgical History:  Procedure Laterality Date   ABDOMINAL HYSTERECTOMY     CATARACT EXTRACTION W/PHACO Bilateral    Dr. Nile Riggs   CHOLECYSTECTOMY     THYROIDECTOMY, PARTIAL     YAG LASER  APPLICATION Right 12/12/2013   Procedure: YAG LASER APPLICATION;  Surgeon: Loraine Leriche T. Nile Riggs, MD;  Location: [facility];  Service: Ophthalmology;  Laterality: Right;    Home Medications:  Allergies as of 01/25/2023       Reactions   Nebivolol Hcl    Other reaction(s): Other (See Comments) Chest tightness,sob,cough,severe tiredness,confusion   Codeine Nausea Only   Sulfa Antibiotics Nausea Only   Sulfonamide Derivatives Nausea Only        Medication List        Accurate as of January 25, 2023 11:13 AM. If you have any questions, ask your nurse or doctor.          acetaminophen 650 MG CR tablet Commonly known as: TYLENOL Take 1,300 mg by mouth 2 (two) times daily.   albuterol (2.5 MG/3ML) 0.083% nebulizer solution Commonly known as: PROVENTIL Take 2.5 mg by nebulization. 2-3 times a week   Align 4 MG Caps Take 1 tablet by mouth every morning.   ALPRAZolam 0.5 MG tablet Commonly known as: Xanax Take 1 tablet (0.5 mg total) by mouth at bedtime as needed for anxiety.   apixaban 5 MG Tabs tablet Commonly known as: ELIQUIS Take 5 mg by mouth 2 (two) times daily.   BIOTIN PO Take 1 tablet by mouth daily.   Cholecalciferol 50 MCG (2000 UT) Caps Take 1 capsule by mouth daily.  cyanocobalamin 1000 MCG tablet Commonly known as: VITAMIN B12 Take 1,000 mcg by mouth daily.   diclofenac Sodium 1 % Gel Commonly known as: VOLTAREN Apply topically.   esomeprazole 40 MG capsule Commonly known as: NEXIUM Take 40 mg by mouth daily.   fentaNYL 25 MCG/HR Commonly known as: DURAGESIC Place 1 patch onto the skin every 3 (three) days.   Fluocinolone Acetonide 0.01 % Oil Place 5 drops into both ears as needed (itching). As needed for itching   ipratropium-albuterol 0.5-2.5 (3) MG/3ML Soln Commonly known as: DUONEB Take 3 mLs by nebulization every 4 (four) hours as needed (Shortness of breath and wheezing).   levothyroxine 137 MCG tablet Commonly known as: SYNTHROID Take 137  mcg by mouth daily.   metoprolol succinate 25 MG 24 hr tablet Commonly known as: TOPROL-XL Take 0.5-1 tablets (12.5-25 mg total) by mouth in the morning and at bedtime. Patient takes 1 tablet (25mg ) in the morning and 1/2 of a tablet (12.5mg ) in the evening What changed:  how much to take when to take this additional instructions   ondansetron 4 MG tablet Commonly known as: ZOFRAN Take 4 mg by mouth every 8 (eight) hours as needed.   potassium chloride 10 MEQ tablet Commonly known as: KLOR-CON M Take 10 mEq by mouth daily.   PRESERVISION AREDS PO Take 1 capsule by mouth daily.   senna 8.6 MG tablet Commonly known as: SENOKOT Take 2 tablets by mouth as needed for constipation.   sodium chloride 0.65 % nasal spray Commonly known as: OCEAN Place 2 sprays into the nose daily as needed.   SYSTANE OP Apply 1 drop to eye daily.   torsemide 20 MG tablet Commonly known as: DEMADEX Take 1 tablet (20 mg total) by mouth daily.        Allergies:  Allergies  Allergen Reactions   Nebivolol Hcl     Other reaction(s): Other (See Comments) Chest tightness,sob,cough,severe tiredness,confusion   Codeine Nausea Only   Sulfa Antibiotics Nausea Only   Sulfonamide Derivatives Nausea Only    Family History: Family History  Problem Relation Age of Onset   Colon cancer Mother    Seizures Mother    Heart attack Father        CHF   Stomach cancer Sister    Heart disease Brother    Diabetes Son    Adrenal disorder Son    Kidney cancer Son     Social History:  reports that she has never smoked. She has never used smokeless tobacco. She reports that she does not drink alcohol and does not use drugs.   Physical Exam: There were no vitals taken for this visit.  Constitutional:  Alert and oriented, No acute distress. HEENT: Brushy AT, moist mucus membranes.  Trachea midline, no masses. Cardiovascular: No clubbing, cyanosis, or edema. Respiratory: Normal respiratory effort, no  increased work of breathing. GI: Abdomen is soft, nontender, nondistended, no abdominal masses GU: No CVA tenderness Skin: No rashes, bruises or suspicious lesions. Neurologic: Grossly intact, no focal deficits, moving all 4 extremities. Psychiatric: Normal mood and affect.  Laboratory Data: Lab Results  Component Value Date   WBC 5.7 09/17/2022   HGB 12.2 09/17/2022   HCT 36.9 09/17/2022   MCV 100.3 (H) 09/17/2022   PLT 214 09/17/2022    Lab Results  Component Value Date   CREATININE 0.63 09/19/2022    No results found for: "PSA"  No results found for: "TESTOSTERONE"  Lab Results  Component Value Date  HGBA1C 6.1 (H) 03/14/2013    Urinalysis    Component Value Date/Time   COLORURINE YELLOW 08/16/2020 1137   APPEARANCEUR CLEAR 08/16/2020 1137   LABSPEC 1.040 (H) 08/16/2020 1137   PHURINE 6.0 08/16/2020 1137   GLUCOSEU NEGATIVE 08/16/2020 1137   HGBUR NEGATIVE 08/16/2020 1137   BILIRUBINUR NEGATIVE 08/16/2020 1137   KETONESUR NEGATIVE 08/16/2020 1137   PROTEINUR NEGATIVE 08/16/2020 1137   UROBILINOGEN 0.2 03/13/2013 2208   NITRITE NEGATIVE 08/16/2020 1137   LEUKOCYTESUR SMALL (A) 08/16/2020 1137    Lab Results  Component Value Date   BACTERIA NONE SEEN 08/16/2020    Pertinent Imaging: No results found for this or any previous visit.  Results for orders placed during the hospital encounter of 08/15/20  US Venous Img Lower Bilateral (DVT)  Narrative CLINICAL DATA:  Bilateral lower extremity pain and edema, left greater than right. Evaluate for DVT.  EXAM: BILATERAL LOWER EXTREMITY VENOUS DOPPLER ULTRASOUND  TECHNIQUE: Gray-scale sonography with graded compression, as well as color Doppler and duplex ultrasound were performed to evaluate the lower extremity deep venous systems from the level of the common femoral vein and including the common femoral, femoral, profunda femoral, popliteal and calf veins including the posterior tibial, peroneal and  gastrocnemius veins when visible. The superficial great saphenous vein was also interrogated. Spectral Doppler was utilized to evaluate flow at rest and with distal augmentation maneuvers in the common femoral, femoral and popliteal veins.  COMPARISON:  Right lower extremity venous Doppler ultrasound-02/09/2013 (negative).  FINDINGS: RIGHT LOWER EXTREMITY  Common Femoral Vein: No evidence of thrombus. Normal compressibility, respiratory phasicity and response to augmentation.  Saphenofemoral Junction: No evidence of thrombus. Normal compressibility and flow on color Doppler imaging.  Profunda Femoral Vein: No evidence of thrombus. Normal compressibility and flow on color Doppler imaging.  Femoral Vein: No evidence of thrombus. Normal compressibility, respiratory phasicity and response to augmentation.  Popliteal Vein: No evidence of thrombus. Normal compressibility, respiratory phasicity and response to augmentation.  Calf Veins: No evidence of thrombus. Normal compressibility and flow on color Doppler imaging.  Superficial Great Saphenous Vein: No evidence of thrombus. Normal compressibility.  Venous Reflux:  None.  Other Findings:  None.  LEFT LOWER EXTREMITY  Common Femoral Vein: No evidence of thrombus. Normal compressibility, respiratory phasicity and response to augmentation.  Saphenofemoral Junction: No evidence of thrombus. Normal compressibility and flow on color Doppler imaging.  Profunda Femoral Vein: No evidence of thrombus. Normal compressibility and flow on color Doppler imaging.  Femoral Vein: No evidence of thrombus. Normal compressibility, respiratory phasicity and response to augmentation.  Popliteal Vein: No evidence of thrombus. Normal compressibility, respiratory phasicity and response to augmentation.  Calf Veins: No evidence of thrombus. Normal compressibility and flow on color Doppler imaging.  Superficial Great Saphenous Vein: No  evidence of thrombus. Normal compressibility.  Venous Reflux:  None.  Other Findings:  None.  IMPRESSION: No evidence of DVT within either lower extremity.   Electronically Signed By: Simonne Come M.D. On: 08/16/2020 12:30    Assessment & Plan:    1. Dysuria -  I recommended OTC Azo, also nonhormonal vaginal lubricants and d-mannose. She is already takes Librarian, academic. We discussed nature r/b/a to TV estrogen-this is recommended for postmenopausal women to prevent UTI and dysuria. I would think her risk of DVT is very low on eliquis but she should discuss with her Heme/Onc physicians.   I will plan to see her back for cystoscopy and exam. She would to know if anything worrisome  is going on.   - Urinalysis, Routine w reflex microscopic - BLADDER SCAN AMB NON-IMAGING   No follow-ups on file.  Jerilee Field, MD  [facility]  [address] [place], [place] [address] [phone]

## 2023-01-28 DIAGNOSIS — X32XXXD Exposure to sunlight, subsequent encounter: Secondary | ICD-10-CM | POA: Diagnosis not present

## 2023-01-28 DIAGNOSIS — L570 Actinic keratosis: Secondary | ICD-10-CM | POA: Diagnosis not present

## 2023-01-28 DIAGNOSIS — Z45018 Encounter for adjustment and management of other part of cardiac pacemaker: Secondary | ICD-10-CM | POA: Diagnosis not present

## 2023-01-28 DIAGNOSIS — B078 Other viral warts: Secondary | ICD-10-CM | POA: Diagnosis not present

## 2023-01-28 DIAGNOSIS — I484 Atypical atrial flutter: Secondary | ICD-10-CM | POA: Diagnosis not present

## 2023-01-28 DIAGNOSIS — Z7901 Long term (current) use of anticoagulants: Secondary | ICD-10-CM | POA: Diagnosis not present

## 2023-01-28 DIAGNOSIS — L820 Inflamed seborrheic keratosis: Secondary | ICD-10-CM | POA: Diagnosis not present

## 2023-01-28 DIAGNOSIS — I43 Cardiomyopathy in diseases classified elsewhere: Secondary | ICD-10-CM | POA: Diagnosis not present

## 2023-01-28 DIAGNOSIS — I429 Cardiomyopathy, unspecified: Secondary | ICD-10-CM | POA: Diagnosis not present

## 2023-01-28 DIAGNOSIS — I491 Atrial premature depolarization: Secondary | ICD-10-CM | POA: Diagnosis not present

## 2023-01-28 DIAGNOSIS — I442 Atrioventricular block, complete: Secondary | ICD-10-CM | POA: Diagnosis not present

## 2023-01-28 DIAGNOSIS — I480 Paroxysmal atrial fibrillation: Secondary | ICD-10-CM | POA: Diagnosis not present

## 2023-01-28 DIAGNOSIS — R42 Dizziness and giddiness: Secondary | ICD-10-CM | POA: Diagnosis not present

## 2023-01-28 DIAGNOSIS — I5030 Unspecified diastolic (congestive) heart failure: Secondary | ICD-10-CM | POA: Diagnosis not present

## 2023-01-28 DIAGNOSIS — R002 Palpitations: Secondary | ICD-10-CM | POA: Diagnosis not present

## 2023-02-02 DIAGNOSIS — H353211 Exudative age-related macular degeneration, right eye, with active choroidal neovascularization: Secondary | ICD-10-CM | POA: Diagnosis not present

## 2023-02-02 DIAGNOSIS — H353133 Nonexudative age-related macular degeneration, bilateral, advanced atrophic without subfoveal involvement: Secondary | ICD-10-CM | POA: Diagnosis not present

## 2023-02-02 DIAGNOSIS — H353221 Exudative age-related macular degeneration, left eye, with active choroidal neovascularization: Secondary | ICD-10-CM | POA: Diagnosis not present

## 2023-02-02 DIAGNOSIS — H35352 Cystoid macular degeneration, left eye: Secondary | ICD-10-CM | POA: Diagnosis not present

## 2023-02-02 DIAGNOSIS — H3321 Serous retinal detachment, right eye: Secondary | ICD-10-CM | POA: Diagnosis not present

## 2023-02-02 DIAGNOSIS — H43812 Vitreous degeneration, left eye: Secondary | ICD-10-CM | POA: Diagnosis not present

## 2023-02-12 DIAGNOSIS — B372 Candidiasis of skin and nail: Secondary | ICD-10-CM | POA: Diagnosis not present

## 2023-02-12 DIAGNOSIS — Z23 Encounter for immunization: Secondary | ICD-10-CM | POA: Diagnosis not present

## 2023-02-12 DIAGNOSIS — L821 Other seborrheic keratosis: Secondary | ICD-10-CM | POA: Diagnosis not present

## 2023-02-16 ENCOUNTER — Ambulatory Visit: Payer: Medicare Other | Admitting: Pulmonary Disease

## 2023-02-16 ENCOUNTER — Telehealth: Payer: Self-pay | Admitting: Pulmonary Disease

## 2023-02-16 NOTE — Telephone Encounter (Signed)
Patient was a walk in this afternoon thinking she had an appointment with Dr. Enedina Finner is requesting we refill her Albuterol nebulizer solution at [facility] ---please call patient when / if this is complete---[phone]

## 2023-02-16 NOTE — Telephone Encounter (Signed)
This was given to her by other provide at [facility] is this okay to refill

## 2023-02-17 DIAGNOSIS — L309 Dermatitis, unspecified: Secondary | ICD-10-CM | POA: Diagnosis not present

## 2023-02-17 DIAGNOSIS — R0982 Postnasal drip: Secondary | ICD-10-CM | POA: Diagnosis not present

## 2023-02-17 DIAGNOSIS — R059 Cough, unspecified: Secondary | ICD-10-CM | POA: Diagnosis not present

## 2023-02-17 DIAGNOSIS — R053 Chronic cough: Secondary | ICD-10-CM | POA: Diagnosis not present

## 2023-02-17 DIAGNOSIS — H6123 Impacted cerumen, bilateral: Secondary | ICD-10-CM | POA: Diagnosis not present

## 2023-02-18 ENCOUNTER — Other Ambulatory Visit: Payer: Self-pay

## 2023-02-18 MED ORDER — ALBUTEROL SULFATE (2.5 MG/3ML) 0.083% IN NEBU: 2.5000 mg | INHALATION_SOLUTION | Freq: Four times a day (QID) | RESPIRATORY_TRACT | 0 refills | Status: DC | PRN

## 2023-02-18 NOTE — Telephone Encounter (Signed)
Sent rx to Pharmacy per Dr Vassie Loll  Albuterol for neds every 6 hours as needed

## 2023-02-18 NOTE — Telephone Encounter (Signed)
Tried to call pt to informed that rx had been sent the pharmacy  , someone answer then d/c the call.

## 2023-02-23 DIAGNOSIS — I890 Lymphedema, not elsewhere classified: Secondary | ICD-10-CM | POA: Diagnosis not present

## 2023-02-23 DIAGNOSIS — I43 Cardiomyopathy in diseases classified elsewhere: Secondary | ICD-10-CM | POA: Diagnosis not present

## 2023-02-23 DIAGNOSIS — I2699 Other pulmonary embolism without acute cor pulmonale: Secondary | ICD-10-CM | POA: Diagnosis not present

## 2023-02-23 DIAGNOSIS — Z79899 Other long term (current) drug therapy: Secondary | ICD-10-CM | POA: Diagnosis not present

## 2023-02-23 DIAGNOSIS — Z9222 Personal history of monoclonal drug therapy: Secondary | ICD-10-CM | POA: Diagnosis not present

## 2023-02-23 DIAGNOSIS — Z888 Allergy status to other drugs, medicaments and biological substances status: Secondary | ICD-10-CM | POA: Diagnosis not present

## 2023-02-23 DIAGNOSIS — Z7901 Long term (current) use of anticoagulants: Secondary | ICD-10-CM | POA: Diagnosis not present

## 2023-02-23 DIAGNOSIS — J849 Interstitial pulmonary disease, unspecified: Secondary | ICD-10-CM | POA: Diagnosis not present

## 2023-02-23 DIAGNOSIS — I5032 Chronic diastolic (congestive) heart failure: Secondary | ICD-10-CM | POA: Diagnosis not present

## 2023-02-23 DIAGNOSIS — E785 Hyperlipidemia, unspecified: Secondary | ICD-10-CM | POA: Diagnosis not present

## 2023-02-23 DIAGNOSIS — E8581 Light chain (AL) amyloidosis: Secondary | ICD-10-CM | POA: Diagnosis not present

## 2023-02-23 DIAGNOSIS — M546 Pain in thoracic spine: Secondary | ICD-10-CM | POA: Diagnosis not present

## 2023-02-23 DIAGNOSIS — E039 Hypothyroidism, unspecified: Secondary | ICD-10-CM | POA: Diagnosis not present

## 2023-02-23 DIAGNOSIS — I480 Paroxysmal atrial fibrillation: Secondary | ICD-10-CM | POA: Diagnosis not present

## 2023-02-23 DIAGNOSIS — I4892 Unspecified atrial flutter: Secondary | ICD-10-CM | POA: Diagnosis not present

## 2023-02-23 DIAGNOSIS — E854 Organ-limited amyloidosis: Secondary | ICD-10-CM | POA: Diagnosis not present

## 2023-02-23 DIAGNOSIS — Z950 Presence of cardiac pacemaker: Secondary | ICD-10-CM | POA: Diagnosis not present

## 2023-02-23 DIAGNOSIS — I10 Essential (primary) hypertension: Secondary | ICD-10-CM | POA: Diagnosis not present

## 2023-02-23 DIAGNOSIS — Z79890 Hormone replacement therapy (postmenopausal): Secondary | ICD-10-CM | POA: Diagnosis not present

## 2023-02-23 DIAGNOSIS — Z885 Allergy status to narcotic agent status: Secondary | ICD-10-CM | POA: Diagnosis not present

## 2023-02-23 DIAGNOSIS — T148XXS Other injury of unspecified body region, sequela: Secondary | ICD-10-CM | POA: Diagnosis not present

## 2023-02-23 DIAGNOSIS — G8929 Other chronic pain: Secondary | ICD-10-CM | POA: Diagnosis not present

## 2023-02-23 DIAGNOSIS — H3530 Unspecified macular degeneration: Secondary | ICD-10-CM | POA: Diagnosis not present

## 2023-02-23 DIAGNOSIS — Z882 Allergy status to sulfonamides status: Secondary | ICD-10-CM | POA: Diagnosis not present

## 2023-02-23 DIAGNOSIS — K5150 Left sided colitis without complications: Secondary | ICD-10-CM | POA: Diagnosis not present

## 2023-03-08 ENCOUNTER — Other Ambulatory Visit: Payer: Medicare Other | Admitting: Urology

## 2023-03-10 DIAGNOSIS — H353133 Nonexudative age-related macular degeneration, bilateral, advanced atrophic without subfoveal involvement: Secondary | ICD-10-CM | POA: Diagnosis not present

## 2023-03-10 DIAGNOSIS — H353211 Exudative age-related macular degeneration, right eye, with active choroidal neovascularization: Secondary | ICD-10-CM | POA: Diagnosis not present

## 2023-03-10 DIAGNOSIS — H3321 Serous retinal detachment, right eye: Secondary | ICD-10-CM | POA: Diagnosis not present

## 2023-03-10 DIAGNOSIS — H35352 Cystoid macular degeneration, left eye: Secondary | ICD-10-CM | POA: Diagnosis not present

## 2023-03-10 DIAGNOSIS — H43812 Vitreous degeneration, left eye: Secondary | ICD-10-CM | POA: Diagnosis not present

## 2023-03-10 DIAGNOSIS — H353221 Exudative age-related macular degeneration, left eye, with active choroidal neovascularization: Secondary | ICD-10-CM | POA: Diagnosis not present

## 2023-03-17 DIAGNOSIS — I509 Heart failure, unspecified: Secondary | ICD-10-CM | POA: Diagnosis not present

## 2023-03-17 DIAGNOSIS — R7303 Prediabetes: Secondary | ICD-10-CM | POA: Diagnosis not present

## 2023-03-17 DIAGNOSIS — E782 Mixed hyperlipidemia: Secondary | ICD-10-CM | POA: Diagnosis not present

## 2023-03-17 DIAGNOSIS — E039 Hypothyroidism, unspecified: Secondary | ICD-10-CM | POA: Diagnosis not present

## 2023-03-23 DIAGNOSIS — E782 Mixed hyperlipidemia: Secondary | ICD-10-CM | POA: Diagnosis not present

## 2023-03-23 DIAGNOSIS — E860 Dehydration: Secondary | ICD-10-CM | POA: Diagnosis not present

## 2023-03-23 DIAGNOSIS — I272 Pulmonary hypertension, unspecified: Secondary | ICD-10-CM | POA: Diagnosis not present

## 2023-03-23 DIAGNOSIS — R110 Nausea: Secondary | ICD-10-CM | POA: Diagnosis not present

## 2023-03-23 DIAGNOSIS — J9600 Acute respiratory failure, unspecified whether with hypoxia or hypercapnia: Secondary | ICD-10-CM | POA: Diagnosis not present

## 2023-03-23 DIAGNOSIS — I2699 Other pulmonary embolism without acute cor pulmonale: Secondary | ICD-10-CM | POA: Diagnosis not present

## 2023-03-23 DIAGNOSIS — I890 Lymphedema, not elsewhere classified: Secondary | ICD-10-CM | POA: Diagnosis not present

## 2023-03-23 DIAGNOSIS — R600 Localized edema: Secondary | ICD-10-CM | POA: Diagnosis not present

## 2023-03-23 DIAGNOSIS — E871 Hypo-osmolality and hyponatremia: Secondary | ICD-10-CM | POA: Diagnosis not present

## 2023-03-23 DIAGNOSIS — I509 Heart failure, unspecified: Secondary | ICD-10-CM | POA: Diagnosis not present

## 2023-03-23 DIAGNOSIS — I4891 Unspecified atrial fibrillation: Secondary | ICD-10-CM | POA: Diagnosis not present

## 2023-03-23 DIAGNOSIS — D808 Other immunodeficiencies with predominantly antibody defects: Secondary | ICD-10-CM | POA: Diagnosis not present

## 2023-03-30 DIAGNOSIS — S80811A Abrasion, right lower leg, initial encounter: Secondary | ICD-10-CM | POA: Diagnosis not present

## 2023-04-01 DIAGNOSIS — H43812 Vitreous degeneration, left eye: Secondary | ICD-10-CM | POA: Diagnosis not present

## 2023-04-01 DIAGNOSIS — H35352 Cystoid macular degeneration, left eye: Secondary | ICD-10-CM | POA: Diagnosis not present

## 2023-04-01 DIAGNOSIS — H353133 Nonexudative age-related macular degeneration, bilateral, advanced atrophic without subfoveal involvement: Secondary | ICD-10-CM | POA: Diagnosis not present

## 2023-04-01 DIAGNOSIS — H353211 Exudative age-related macular degeneration, right eye, with active choroidal neovascularization: Secondary | ICD-10-CM | POA: Diagnosis not present

## 2023-04-01 DIAGNOSIS — H3321 Serous retinal detachment, right eye: Secondary | ICD-10-CM | POA: Diagnosis not present

## 2023-04-01 DIAGNOSIS — H353221 Exudative age-related macular degeneration, left eye, with active choroidal neovascularization: Secondary | ICD-10-CM | POA: Diagnosis not present

## 2023-04-26 NOTE — Progress Notes (Signed)
 Name: Cindy [name] DOB: 04-Jun-1932 MRN: [id number]  History of Present Illness: Cindy [name] is a 89 y.o. female who presents today for follow up visit at [facility] [place].   At initial visit with Cindy [name] on 01/25/2023: - Reported dysuria and UTI not resolved with antibiotic use.  - Reported red around her vagina.  - Reported intermittent urinary stream, frequency, and urgency. Taking a diuretic. - No vaginal or uterine or breast cancer. S/p Hx. She has a h/o DVT. On eliquis . 2022 [place]  with no DVT. No h/o radiation. She does have a fentanyl  patch. She has h/o chemotherapy. 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 Managed for 8 yrs.  - The plan was: 1. Recommended OTC Azo, also nonhormonal vaginal lubricants and d-mannose.  2. Continue Align probiotic.  3. Discussed topical vaginal estrogen; risk of DVT considered to be very low with that but she was advised to discuss it with her Heme/Onc physicians. 4. Follow up for cystoscopy and exam.   Since last visit: - 02/23/2023: Normal renal function (creatinine 0.68, GFR 84).  - 03/08/2023: No show for cystoscopy with Cindy [name].  Today: She reports that her PCP gave her some medication (unclear which one) which she is taking which has resolved the red rash in her groin / vaginal area. She also reports seeing dermatology.   She denies increased urinary urgency, frequency, nocturia, dysuria, gross hematuria, hesitancy, straining to void, or sensations of incomplete emptying.  She denies vaginal pain, bleeding, discharge.  She reports that her blood work at Cindy [name] office has all been good recently including her renal function.    Fall Screening: Do you usually have a device to assist in your mobility? Yes - cane   Medications: Current Outpatient Medications  Medication Sig Dispense Refill   acetaminophen  (TYLENOL ) 650 MG CR  tablet Take 1,300 mg by mouth 2 (two) times daily.     albuterol  (PROVENTIL ) (2.5 MG/3ML) 0.083% nebulizer solution Take 3 mLs (2.5 mg total) by nebulization every 6 (six) hours as needed for wheezing or shortness of breath. 2-3 times a week 75 mL 0   ALPRAZolam  (XANAX ) 0.5 MG tablet Take 1 tablet (0.5 mg total) by mouth at bedtime as needed for anxiety. 30 tablet 3   apixaban  (ELIQUIS ) 5 MG TABS tablet Take 5 mg by mouth 2 (two) times daily.     BIOTIN PO Take 1 tablet by mouth daily.     cyanocobalamin (VITAMIN B12) 1000 MCG tablet Take 1,000 mcg by mouth daily.     fentaNYL  (DURAGESIC ) 25 MCG/HR Place 1 patch onto the skin every 3 (three) days.     Fluocinolone Acetonide 0.01 % OIL Place 5 drops into both ears as needed (itching). As needed for itching     ipratropium-albuterol  (DUONEB) 0.5-2.5 (3) MG/3ML SOLN Take 3 mLs by nebulization every 4 (four) hours as needed (Shortness of breath and wheezing). 360 mL 3   levothyroxine  (SYNTHROID ) 137 MCG tablet Take 137 mcg by mouth daily.     metoprolol  succinate (TOPROL -XL) 25 MG 24 hr tablet Take 0.5-1 tablets (12.5-25 mg total) by mouth in the morning and at bedtime. Patient takes 1 tablet (25mg ) in the morning and 1/2 of a tablet (12.5mg ) in the evening (Patient taking differently: Take 12.5 mg by mouth daily.)     Multiple Vitamins-Minerals (PRESERVISION AREDS PO) Take 1 capsule by mouth daily.     ondansetron  (ZOFRAN ) 4  MG tablet Take 4 mg by mouth every 8 (eight) hours as needed.     Polyethyl Glycol-Propyl Glycol (SYSTANE OP) Apply 1 drop to eye daily.     potassium chloride  (KLOR-CON  M) 10 MEQ tablet Take 10 mEq by mouth daily.     Probiotic Product (ALIGN) 4 MG CAPS Take 1 tablet by mouth every morning.     senna (SENOKOT) 8.6 MG tablet Take 2 tablets by mouth as needed for constipation.     sodium chloride  (OCEAN) 0.65 % nasal spray Place 2 sprays into the nose daily as needed.      torsemide  (DEMADEX ) 20 MG tablet Take 1 tablet (20 mg  total) by mouth daily.     Cholecalciferol  50 MCG (2000 UT) CAPS Take 1 capsule by mouth daily. (Patient not taking: Reported on 04/29/2023)     diclofenac Sodium (VOLTAREN) 1 % GEL Apply topically. (Patient not taking: Reported on 04/29/2023)     esomeprazole  (NEXIUM ) 40 MG capsule Take 40 mg by mouth daily. (Patient not taking: Reported on 04/29/2023)     No current facility-administered medications for this visit.    Allergies: Allergies  Allergen Reactions   Nebivolol Hcl     Other reaction(s): Other (See Comments) Chest tightness,sob,cough,severe tiredness,confusion   Codeine Nausea Only   Sulfa Antibiotics Nausea Only   Sulfonamide Derivatives Nausea Only    Past Medical History:  Diagnosis Date   Amyloidosis (HCC)    cancer   CHF (congestive heart failure) (HCC)    Amyloidosis   Diarrhea    Diverticulosis of colon (without mention of hemorrhage)    Edema    Esophagitis, unspecified    Family history of malignant neoplasm of gastrointestinal tract    Fibrosis of lung (HCC) 03/14/2013   Flatulence, eructation, and gas pain    GERD (gastroesophageal reflux disease)    Left sided ulcerative colitis (HCC)    Macular degeneration    left eye   Maxillary sinus mass    Osteoporosis, unspecified    Other and unspecified hyperlipidemia    Other and unspecified noninfectious gastroenteritis and colitis(558.9)    Paroxysmal atrial fibrillation (HCC) 07/09/2019   Pneumonia    Stricture and stenosis of esophagus    Unspecified hypothyroidism    URI (upper respiratory infection)    Past Surgical History:  Procedure Laterality Date   ABDOMINAL HYSTERECTOMY     CATARACT EXTRACTION W/PHACO Bilateral    Cindy [name]   CHOLECYSTECTOMY     THYROIDECTOMY, PARTIAL     YAG LASER APPLICATION Right 12/12/2013   Procedure: YAG LASER APPLICATION;  Surgeon: Cindy T. Roz, MD;  Location: [facility];  Service: Ophthalmology;  Laterality: Right;   Family History  Problem Relation Age of Onset    Colon cancer Mother    Seizures Mother    Heart attack Father        CHF   Stomach cancer Sister    Heart disease Brother    Diabetes Son    Adrenal disorder Son    Kidney cancer Son    Social History   Socioeconomic History   Marital status: Married    Spouse name: Not on file   Number of children: 2   Years of education: Not on file   Highest education level: Not on file  Occupational History   Occupation: Retired  Tobacco Use   Smoking status: Never   Smokeless tobacco: Never  Vaping Use   Vaping status: Never Used  Substance and Sexual Activity  Alcohol  use: No    Alcohol /week: 0.0 standard drinks of alcohol    Drug use: No   Sexual activity: Yes    Birth control/protection: Surgical  Other Topics Concern   Not on file  Social History Narrative   Not on file   Social Drivers of Health   Financial Resource Strain: Not on file  Food Insecurity: No Food Insecurity (03/14/2019)   [facility]   Hunger Vital Sign    Worried About Running Out of Food in the Last Year: Never true    Ran Out of Food in the Last Year: Never true  Transportation Needs: Not on file  Physical Activity: Not on file  Stress: Not on file  Social Connections: Not on file  Intimate Partner Violence: Not on file    Review of Systems Constitutional: Patient denies any unintentional weight loss or change in strength lntegumentary: Patient denies any rashes or pruritus Cardiovascular: Patient denies chest pain or syncope Respiratory: Patient denies shortness of breath Gastrointestinal: Patient denies nausea, vomiting, constipation, or diarrhea Musculoskeletal: Patient denies muscle cramps or weakness Neurologic: Patient denies convulsions or seizures Allergic/Immunologic: Patient denies recent allergic reaction(s) Hematologic/Lymphatic: Patient denies bleeding tendencies Endocrine: Patient denies heat/cold intolerance  GU: As per  HPI.  OBJECTIVE Vitals:   04/29/23 1127  BP: 125/80  Pulse: (!) 101   There is no height or weight on file to calculate BMI.  Physical Examination Constitutional: No obvious distress; patient is non-toxic appearing  Cardiovascular: No visible lower extremity edema.  Respiratory: The patient does not have audible wheezing/stridor; respirations do not appear labored. On oxygen  nasal cannula. Gastrointestinal: Abdomen non-distended Musculoskeletal: Normal ROM of UEs  Skin: No obvious rashes/open sores  Neurologic: CN 2-12 grossly intact Psychiatric: Answered questions appropriately with normal affect  Hematologic/Lymphatic/Immunologic: No obvious bruises or sites of spontaneous bleeding  Urine microscopy: 6-10 WBC/hpf, 0-2 RBC/hpf, many bacteria PVR: 94 ml  ASSESSMENT Atrophic vaginitis - Plan: Urinalysis, Routine w reflex microscopic, BLADDER SCAN AMB NON-IMAGING  She is doing well today with no acute urologic concerns. We agreed to follow up on an as-needed basis. Pt verbalized understanding and agreement. All questions were answered.  PLAN Advised the following: 1. Return if symptoms worsen or fail to improve.  Orders Placed This Encounter  Procedures   Urinalysis, Routine w reflex microscopic   BLADDER SCAN AMB NON-IMAGING    It has been explained that the patient is to follow regularly with their PCP in addition to all other providers involved in their care and to follow instructions provided by these respective offices. Patient advised to contact urology clinic if any urologic-pertaining questions, concerns, new symptoms or problems arise in the interim period.  There are no Patient Instructions on file for this visit.  Electronically signed by:  Lauraine JAYSON Oz, FNP   04/29/23    12:25 PM

## 2023-04-29 ENCOUNTER — Encounter: Payer: Self-pay | Admitting: Urology

## 2023-04-29 ENCOUNTER — Ambulatory Visit (INDEPENDENT_AMBULATORY_CARE_PROVIDER_SITE_OTHER): Payer: Medicare Other | Admitting: Urology

## 2023-04-29 VITALS — BP 125/80 | HR 101

## 2023-04-29 DIAGNOSIS — I484 Atypical atrial flutter: Secondary | ICD-10-CM | POA: Diagnosis not present

## 2023-04-29 DIAGNOSIS — N952 Postmenopausal atrophic vaginitis: Secondary | ICD-10-CM

## 2023-04-29 DIAGNOSIS — I442 Atrioventricular block, complete: Secondary | ICD-10-CM | POA: Diagnosis not present

## 2023-04-29 DIAGNOSIS — Z7901 Long term (current) use of anticoagulants: Secondary | ICD-10-CM | POA: Diagnosis not present

## 2023-04-29 DIAGNOSIS — I071 Rheumatic tricuspid insufficiency: Secondary | ICD-10-CM | POA: Diagnosis not present

## 2023-04-29 DIAGNOSIS — Z45018 Encounter for adjustment and management of other part of cardiac pacemaker: Secondary | ICD-10-CM | POA: Diagnosis not present

## 2023-04-29 DIAGNOSIS — R002 Palpitations: Secondary | ICD-10-CM | POA: Diagnosis not present

## 2023-04-29 DIAGNOSIS — I429 Cardiomyopathy, unspecified: Secondary | ICD-10-CM | POA: Diagnosis not present

## 2023-04-29 DIAGNOSIS — I43 Cardiomyopathy in diseases classified elsewhere: Secondary | ICD-10-CM | POA: Diagnosis not present

## 2023-04-29 DIAGNOSIS — I491 Atrial premature depolarization: Secondary | ICD-10-CM | POA: Diagnosis not present

## 2023-04-29 DIAGNOSIS — I340 Nonrheumatic mitral (valve) insufficiency: Secondary | ICD-10-CM | POA: Diagnosis not present

## 2023-04-29 DIAGNOSIS — I5030 Unspecified diastolic (congestive) heart failure: Secondary | ICD-10-CM | POA: Diagnosis not present

## 2023-04-29 LAB — MICROSCOPIC EXAMINATION

## 2023-04-29 LAB — URINALYSIS, ROUTINE W REFLEX MICROSCOPIC
Bilirubin, UA: NEGATIVE
Glucose, UA: NEGATIVE
Ketones, UA: NEGATIVE
Nitrite, UA: POSITIVE — AB
Protein,UA: NEGATIVE
RBC, UA: NEGATIVE
Specific Gravity, UA: 1.015 (ref 1.005–1.030)
Urobilinogen, Ur: 0.2 mg/dL (ref 0.2–1.0)
pH, UA: 6 (ref 5.0–7.5)

## 2023-04-29 NOTE — Progress Notes (Signed)
PVR 94

## 2023-05-10 DIAGNOSIS — H353221 Exudative age-related macular degeneration, left eye, with active choroidal neovascularization: Secondary | ICD-10-CM | POA: Diagnosis not present

## 2023-05-10 DIAGNOSIS — H353211 Exudative age-related macular degeneration, right eye, with active choroidal neovascularization: Secondary | ICD-10-CM | POA: Diagnosis not present

## 2023-05-10 DIAGNOSIS — H353133 Nonexudative age-related macular degeneration, bilateral, advanced atrophic without subfoveal involvement: Secondary | ICD-10-CM | POA: Diagnosis not present

## 2023-05-10 DIAGNOSIS — H3321 Serous retinal detachment, right eye: Secondary | ICD-10-CM | POA: Diagnosis not present

## 2023-05-10 DIAGNOSIS — H43812 Vitreous degeneration, left eye: Secondary | ICD-10-CM | POA: Diagnosis not present

## 2023-05-10 DIAGNOSIS — H35352 Cystoid macular degeneration, left eye: Secondary | ICD-10-CM | POA: Diagnosis not present

## 2023-05-20 DIAGNOSIS — R895 Abnormal microbiological findings in specimens from other organs, systems and tissues: Secondary | ICD-10-CM | POA: Diagnosis not present

## 2023-05-20 DIAGNOSIS — J849 Interstitial pulmonary disease, unspecified: Secondary | ICD-10-CM | POA: Diagnosis not present

## 2023-05-20 DIAGNOSIS — I4891 Unspecified atrial fibrillation: Secondary | ICD-10-CM | POA: Diagnosis not present

## 2023-05-27 DIAGNOSIS — H353211 Exudative age-related macular degeneration, right eye, with active choroidal neovascularization: Secondary | ICD-10-CM | POA: Diagnosis not present

## 2023-05-27 DIAGNOSIS — H353221 Exudative age-related macular degeneration, left eye, with active choroidal neovascularization: Secondary | ICD-10-CM | POA: Diagnosis not present

## 2023-05-27 DIAGNOSIS — H43812 Vitreous degeneration, left eye: Secondary | ICD-10-CM | POA: Diagnosis not present

## 2023-05-27 DIAGNOSIS — H353123 Nonexudative age-related macular degeneration, left eye, advanced atrophic without subfoveal involvement: Secondary | ICD-10-CM | POA: Diagnosis not present

## 2023-05-27 DIAGNOSIS — H353124 Nonexudative age-related macular degeneration, left eye, advanced atrophic with subfoveal involvement: Secondary | ICD-10-CM | POA: Diagnosis not present

## 2023-05-27 DIAGNOSIS — H3321 Serous retinal detachment, right eye: Secondary | ICD-10-CM | POA: Diagnosis not present

## 2023-05-27 DIAGNOSIS — H35352 Cystoid macular degeneration, left eye: Secondary | ICD-10-CM | POA: Diagnosis not present

## 2023-06-10 DIAGNOSIS — J029 Acute pharyngitis, unspecified: Secondary | ICD-10-CM | POA: Diagnosis not present

## 2023-06-10 DIAGNOSIS — J069 Acute upper respiratory infection, unspecified: Secondary | ICD-10-CM | POA: Diagnosis not present

## 2023-06-29 ENCOUNTER — Ambulatory Visit (HOSPITAL_BASED_OUTPATIENT_CLINIC_OR_DEPARTMENT_OTHER): Payer: Medicare Other | Admitting: Pulmonary Disease

## 2023-06-29 ENCOUNTER — Ambulatory Visit (HOSPITAL_BASED_OUTPATIENT_CLINIC_OR_DEPARTMENT_OTHER)

## 2023-06-29 ENCOUNTER — Encounter (HOSPITAL_BASED_OUTPATIENT_CLINIC_OR_DEPARTMENT_OTHER): Payer: Self-pay | Admitting: Pulmonary Disease

## 2023-06-29 VITALS — BP 118/68 | HR 78 | Ht 67.0 in | Wt 123.6 lb

## 2023-06-29 DIAGNOSIS — I272 Pulmonary hypertension, unspecified: Secondary | ICD-10-CM | POA: Diagnosis not present

## 2023-06-29 DIAGNOSIS — J9611 Chronic respiratory failure with hypoxia: Secondary | ICD-10-CM | POA: Diagnosis not present

## 2023-06-29 DIAGNOSIS — J209 Acute bronchitis, unspecified: Secondary | ICD-10-CM

## 2023-06-29 DIAGNOSIS — R059 Cough, unspecified: Secondary | ICD-10-CM | POA: Diagnosis not present

## 2023-06-29 NOTE — Patient Instructions (Signed)
 CXR today  Continue on fluid pill

## 2023-06-29 NOTE — Progress Notes (Signed)
 Subjective:    Patient ID: Cindy [name], female    DOB: [birth year], 89 y.o.   MRN: [id number]  HPI  89 yo never smoker with cardiac amyloid and pulmonary hypertension for FU ofchronic hypoxic respiratory failure. Per last evaluation at [facility] pulmonology , ILD was stable and unlikely to be pulmonary amyloid,?  Sequelae of aspiration was possible due to patulous esophagus.  Dyspnea was attributed to pulmonary hypertension 6-minute walk was about 200 m She does not have significant ILD on HRCT 11/2021 ,decreased DLCO is due to pulmonary hypertension - WHO 2.    Previous follow-up with [facility] cardiology- Dr Carilyn Goodpasture, [facility] amyloid clinic Dr. Leeanne Deed, previous pulmonary evaluation by Dr. Dudley Major at [facility] and my partner Dr. Sherene Sires She was diagnosed with cardiac amyloid in 2016 and was on hospice for a while with right Pleurx catheter but now has been remarkably stable for many years, and off medication since 2020.  Remains on fentanyl patch. she was diagnosed with pulmonary emboli in 2021 & 2022 and is on lifetime anticoagulation She has chronic lower extremity edema since 2021 due to right heart failure and lymphedema     PMH  - HFpEF - Afib s/p ablation and DCCV (2016), AV block requiring dual lead pacemaker (03/2017), - Cardiac amyloid not on current medications, with preserved EF but restricitive CM/ restrictive LV filling pattern -being followed by [facility] --> 2016 - bortezomid/cytoxan and VITAL study --> 2019/2020 - Isatuximab, stopped per patient - Pulmonary embolism x2 --> Acute PE 08/16/20 in RUL and RML and in 07/08/2019 in the right main pulm artery and LUL - Macular degeneration -lymphedema -remote h/o ulcerative colitis  The patient, with a history of pulmonary hypertension and chronic hypoxia, presents with a three-week history of persistent cough and difficulty breathing. The patient describes the cough as productive, with deep yellow and slightly reddish phlegm. The patient  denies any visible blood in the phlegm. The symptoms were initially thought to be due to a cold, but have persisted despite treatment with two courses of antibiotics and a full bottle of Mucinex. The patient reports significant improvement in symptoms with this treatment, estimating a 75% improvement compared to the onset of symptoms.  The patient also reports chronic fluid retention and leg swelling, which she manages with 1.5 tablets of torsemide daily. She notes that the swelling fluctuates and is particularly noticeable in her legs. She also mentions some varicose veins in her legs. The patient has been managing her fluid retention by skipping her diuretic on days when she has to leave the house for extended periods, such as for church.    Significant tests/ events reviewed   Amb sat 06/2021 >> desat to 93% on walking at slow pace   Echo 06/2022 at [facility] showed moderate TR but normal RV function   Echo 06/2021 RVSP elevated at 51 mm 12/2020 TTE at [facility] ,estimated RVSP  50 mmHg, mod TR, gr 2 DD   12/2020 VQ neg     HRCT chest 09/2022 >> Chronic patchy centrilobular micronodularity and faint patchy GGO throughout both lungs, unchanged, differential includes plexogenic pulmonary arteriopathy from chronic pulmonary hypertension with superimposed mild pulmonary edema, versus chronic hypersensitivity pneumonitis. Stable small loculated basilar right pleural effusion. Stable thick bandlike consolidation in the posterior lungs, right greater than left, favoring bland postinfectious scarring     HRCT chest 11/2021 bandlike consolidation has decreased bilaterally, stable small Rt effusion loculated   HRCT 11/2020  Mild peripheral reticulation in left upper  lobe and superior segment of left lower lobe and posterior right upper lobe, may represent early interstitial lung abnormalities vs sequelae of prior aspiration.  Curvilinear scarring in right lower lobe.   Cardiomegaly and dilated main pulmonary artery  measuring 3.5 cm, may represent elevated pulmonary arterial pressures.  Small right pleural effusion.   PFTs  - pfts 04/04/14  VC  2.10 (66%) no airflow obst   DLC0 48%   12/16/2020 FVC (% Pred) 2.05 (76.5) FEV1 (% Pred)  1.69 (84.5) DLCO  38.6% , TLC 88%  6 Minute Walk:  199m, O2 nadir 91% on room air   Review of Systems neg for any significant sore throat, dysphagia, itching, sneezing, nasal congestion or excess/ purulent secretions, fever, chills, sweats, unintended wt loss, pleuritic or exertional cp, hempoptysis, orthopnea pnd or change in chronic leg swelling. Also denies presyncope, palpitations, heartburn, abdominal pain, nausea, vomiting, diarrhea or change in bowel or urinary habits, dysuria,hematuria, rash, arthralgias, visual complaints, headache, numbness weakness or ataxia.     Objective:   Physical Exam  Gen. Pleasant, elderly, thin, frail,on O2 in no distress ENT - no thrush, no pallor/icterus,no post nasal drip Neck: No JVD, no thyromegaly, no carotid bruits Lungs: no use of accessory muscles, no dullness to percussion, clear without rales or rhonchi  Cardiovascular: Rhythm regular, heart sounds  normal, no murmurs or gallops, no peripheral edema Musculoskeletal: No deformities, no cyanosis or clubbing        Assessment & Plan:   Acute Bronchitis She has been experiencing symptoms consistent with acute bronchitis, including a persistent cough with colored phlegm, for the past three weeks. Antibiotic treatment has led to significant improvement, with subsided cough and improved breathing. Although there is concern for possible pneumonia, symptom improvement suggests this is less likely. - Order chest x-ray >> no pneumonia , effusions to my review - Continue current antibiotics as prescribed  Pulmonary Hypertension Pulmonary hypertension contributes to her chronic hypoxia, requiring ongoing management and monitoring.She experiences fluid retention, particularly in  her legs, managed with torsemide. She reports some improvement but continues to have fluid buildup. She takes 1.5 pills of torsemide daily and occasionally skips doses for convenience. - Continue current management plan for pulmonary hypertension - Monitor oxygen levels and adjust oxygen therapy as needed - Continue torsemide, 1.5 pills daily - Monitor fluid status and adjust diuretic therapy as needed  Chronic resp failure with  Hypoxia Chronic hypoxia, likely secondary to pulmonary hypertension, is managed with supplemental oxygen. An incident of oxygen saturation dropping to 77% was possibly due to nasal cannula displacement. Proper use of supplemental oxygen is crucial. - Ensure proper use of supplemental oxygen

## 2023-07-05 DIAGNOSIS — H3321 Serous retinal detachment, right eye: Secondary | ICD-10-CM | POA: Diagnosis not present

## 2023-07-05 DIAGNOSIS — H353114 Nonexudative age-related macular degeneration, right eye, advanced atrophic with subfoveal involvement: Secondary | ICD-10-CM | POA: Diagnosis not present

## 2023-07-05 DIAGNOSIS — H35352 Cystoid macular degeneration, left eye: Secondary | ICD-10-CM | POA: Diagnosis not present

## 2023-07-05 DIAGNOSIS — H353221 Exudative age-related macular degeneration, left eye, with active choroidal neovascularization: Secondary | ICD-10-CM | POA: Diagnosis not present

## 2023-07-05 DIAGNOSIS — H353123 Nonexudative age-related macular degeneration, left eye, advanced atrophic without subfoveal involvement: Secondary | ICD-10-CM | POA: Diagnosis not present

## 2023-07-05 DIAGNOSIS — H43812 Vitreous degeneration, left eye: Secondary | ICD-10-CM | POA: Diagnosis not present

## 2023-07-05 DIAGNOSIS — H353211 Exudative age-related macular degeneration, right eye, with active choroidal neovascularization: Secondary | ICD-10-CM | POA: Diagnosis not present

## 2023-07-15 NOTE — Progress Notes (Signed)
 Tired to contact pt but no answer or VM

## 2023-07-20 DIAGNOSIS — H353211 Exudative age-related macular degeneration, right eye, with active choroidal neovascularization: Secondary | ICD-10-CM | POA: Diagnosis not present

## 2023-07-20 DIAGNOSIS — H353114 Nonexudative age-related macular degeneration, right eye, advanced atrophic with subfoveal involvement: Secondary | ICD-10-CM | POA: Diagnosis not present

## 2023-07-20 DIAGNOSIS — H43812 Vitreous degeneration, left eye: Secondary | ICD-10-CM | POA: Diagnosis not present

## 2023-07-20 DIAGNOSIS — H353123 Nonexudative age-related macular degeneration, left eye, advanced atrophic without subfoveal involvement: Secondary | ICD-10-CM | POA: Diagnosis not present

## 2023-07-20 DIAGNOSIS — H35352 Cystoid macular degeneration, left eye: Secondary | ICD-10-CM | POA: Diagnosis not present

## 2023-07-20 DIAGNOSIS — H3321 Serous retinal detachment, right eye: Secondary | ICD-10-CM | POA: Diagnosis not present

## 2023-07-20 DIAGNOSIS — H353221 Exudative age-related macular degeneration, left eye, with active choroidal neovascularization: Secondary | ICD-10-CM | POA: Diagnosis not present

## 2023-07-23 NOTE — Progress Notes (Signed)
 Unable to leave pt message as no VM

## 2023-07-28 NOTE — Progress Notes (Signed)
Unable to leave pt message

## 2023-07-29 DIAGNOSIS — I071 Rheumatic tricuspid insufficiency: Secondary | ICD-10-CM | POA: Diagnosis not present

## 2023-07-29 DIAGNOSIS — I43 Cardiomyopathy in diseases classified elsewhere: Secondary | ICD-10-CM | POA: Diagnosis not present

## 2023-07-29 DIAGNOSIS — I5030 Unspecified diastolic (congestive) heart failure: Secondary | ICD-10-CM | POA: Diagnosis not present

## 2023-07-29 DIAGNOSIS — I491 Atrial premature depolarization: Secondary | ICD-10-CM | POA: Diagnosis not present

## 2023-07-29 DIAGNOSIS — I340 Nonrheumatic mitral (valve) insufficiency: Secondary | ICD-10-CM | POA: Diagnosis not present

## 2023-07-29 DIAGNOSIS — I429 Cardiomyopathy, unspecified: Secondary | ICD-10-CM | POA: Diagnosis not present

## 2023-07-29 DIAGNOSIS — I484 Atypical atrial flutter: Secondary | ICD-10-CM | POA: Diagnosis not present

## 2023-07-29 DIAGNOSIS — Z45018 Encounter for adjustment and management of other part of cardiac pacemaker: Secondary | ICD-10-CM | POA: Diagnosis not present

## 2023-07-29 DIAGNOSIS — Z7901 Long term (current) use of anticoagulants: Secondary | ICD-10-CM | POA: Diagnosis not present

## 2023-07-29 DIAGNOSIS — I442 Atrioventricular block, complete: Secondary | ICD-10-CM | POA: Diagnosis not present

## 2023-07-29 DIAGNOSIS — R002 Palpitations: Secondary | ICD-10-CM | POA: Diagnosis not present

## 2023-07-29 NOTE — Progress Notes (Signed)
 Results mailed to patient as no answer at either number after multiple attempts

## 2023-08-17 DIAGNOSIS — E039 Hypothyroidism, unspecified: Secondary | ICD-10-CM | POA: Diagnosis not present

## 2023-08-17 DIAGNOSIS — I509 Heart failure, unspecified: Secondary | ICD-10-CM | POA: Diagnosis not present

## 2023-08-17 DIAGNOSIS — R7303 Prediabetes: Secondary | ICD-10-CM | POA: Diagnosis not present

## 2023-08-17 DIAGNOSIS — E782 Mixed hyperlipidemia: Secondary | ICD-10-CM | POA: Diagnosis not present

## 2023-08-19 DIAGNOSIS — D808 Other immunodeficiencies with predominantly antibody defects: Secondary | ICD-10-CM | POA: Diagnosis not present

## 2023-08-19 DIAGNOSIS — I890 Lymphedema, not elsewhere classified: Secondary | ICD-10-CM | POA: Diagnosis not present

## 2023-08-19 DIAGNOSIS — R110 Nausea: Secondary | ICD-10-CM | POA: Diagnosis not present

## 2023-08-19 DIAGNOSIS — R600 Localized edema: Secondary | ICD-10-CM | POA: Diagnosis not present

## 2023-08-19 DIAGNOSIS — E860 Dehydration: Secondary | ICD-10-CM | POA: Diagnosis not present

## 2023-08-19 DIAGNOSIS — I4891 Unspecified atrial fibrillation: Secondary | ICD-10-CM | POA: Diagnosis not present

## 2023-08-19 DIAGNOSIS — J9601 Acute respiratory failure with hypoxia: Secondary | ICD-10-CM | POA: Diagnosis not present

## 2023-08-19 DIAGNOSIS — I2699 Other pulmonary embolism without acute cor pulmonale: Secondary | ICD-10-CM | POA: Diagnosis not present

## 2023-08-19 DIAGNOSIS — J849 Interstitial pulmonary disease, unspecified: Secondary | ICD-10-CM | POA: Diagnosis not present

## 2023-08-19 DIAGNOSIS — E871 Hypo-osmolality and hyponatremia: Secondary | ICD-10-CM | POA: Diagnosis not present

## 2023-08-19 DIAGNOSIS — I272 Pulmonary hypertension, unspecified: Secondary | ICD-10-CM | POA: Diagnosis not present

## 2023-08-19 DIAGNOSIS — I509 Heart failure, unspecified: Secondary | ICD-10-CM | POA: Diagnosis not present

## 2023-08-24 DIAGNOSIS — H353221 Exudative age-related macular degeneration, left eye, with active choroidal neovascularization: Secondary | ICD-10-CM | POA: Diagnosis not present

## 2023-08-24 DIAGNOSIS — H3321 Serous retinal detachment, right eye: Secondary | ICD-10-CM | POA: Diagnosis not present

## 2023-08-24 DIAGNOSIS — H353114 Nonexudative age-related macular degeneration, right eye, advanced atrophic with subfoveal involvement: Secondary | ICD-10-CM | POA: Diagnosis not present

## 2023-08-24 DIAGNOSIS — H35352 Cystoid macular degeneration, left eye: Secondary | ICD-10-CM | POA: Diagnosis not present

## 2023-08-24 DIAGNOSIS — H353211 Exudative age-related macular degeneration, right eye, with active choroidal neovascularization: Secondary | ICD-10-CM | POA: Diagnosis not present

## 2023-08-24 DIAGNOSIS — H43812 Vitreous degeneration, left eye: Secondary | ICD-10-CM | POA: Diagnosis not present

## 2023-08-24 DIAGNOSIS — H353123 Nonexudative age-related macular degeneration, left eye, advanced atrophic without subfoveal involvement: Secondary | ICD-10-CM | POA: Diagnosis not present

## 2023-09-01 DIAGNOSIS — I43 Cardiomyopathy in diseases classified elsewhere: Secondary | ICD-10-CM | POA: Diagnosis not present

## 2023-09-01 DIAGNOSIS — E854 Organ-limited amyloidosis: Secondary | ICD-10-CM | POA: Diagnosis not present

## 2023-09-01 DIAGNOSIS — R0609 Other forms of dyspnea: Secondary | ICD-10-CM | POA: Diagnosis not present

## 2023-09-02 ENCOUNTER — Emergency Department (HOSPITAL_COMMUNITY)
Admission: EM | Admit: 2023-09-02 | Discharge: 2023-09-02 | Disposition: A | Discharge status: Home / Self Care | Attending: Emergency Medicine | Admitting: Emergency Medicine

## 2023-09-02 ENCOUNTER — Emergency Department (HOSPITAL_COMMUNITY)

## 2023-09-02 ENCOUNTER — Encounter (HOSPITAL_COMMUNITY): Payer: Self-pay

## 2023-09-02 ENCOUNTER — Other Ambulatory Visit: Payer: Self-pay

## 2023-09-02 DIAGNOSIS — R531 Weakness: Secondary | ICD-10-CM | POA: Diagnosis not present

## 2023-09-02 DIAGNOSIS — R0602 Shortness of breath: Secondary | ICD-10-CM | POA: Insufficient documentation

## 2023-09-02 DIAGNOSIS — I509 Heart failure, unspecified: Secondary | ICD-10-CM | POA: Insufficient documentation

## 2023-09-02 DIAGNOSIS — R0989 Other specified symptoms and signs involving the circulatory and respiratory systems: Secondary | ICD-10-CM | POA: Diagnosis not present

## 2023-09-02 DIAGNOSIS — Z7901 Long term (current) use of anticoagulants: Secondary | ICD-10-CM | POA: Insufficient documentation

## 2023-09-02 DIAGNOSIS — R5383 Other fatigue: Secondary | ICD-10-CM | POA: Diagnosis not present

## 2023-09-02 DIAGNOSIS — J90 Pleural effusion, not elsewhere classified: Secondary | ICD-10-CM | POA: Diagnosis not present

## 2023-09-02 DIAGNOSIS — I517 Cardiomegaly: Secondary | ICD-10-CM | POA: Diagnosis not present

## 2023-09-02 LAB — URINALYSIS, ROUTINE W REFLEX MICROSCOPIC
Bacteria, UA: NONE SEEN
Bilirubin Urine: NEGATIVE
Glucose, UA: NEGATIVE mg/dL
Hgb urine dipstick: NEGATIVE
Ketones, ur: NEGATIVE mg/dL
Nitrite: NEGATIVE
Protein, ur: NEGATIVE mg/dL
Specific Gravity, Urine: 1.004 — ABNORMAL LOW (ref 1.005–1.030)
pH: 6 (ref 5.0–8.0)

## 2023-09-02 LAB — CBC WITH DIFFERENTIAL/PLATELET
Abs Immature Granulocytes: 0.01 10*3/uL (ref 0.00–0.07)
Basophils Absolute: 0.1 10*3/uL (ref 0.0–0.1)
Basophils Relative: 1 %
Eosinophils Absolute: 0.1 10*3/uL (ref 0.0–0.5)
Eosinophils Relative: 2 %
HCT: 42.8 % (ref 36.0–46.0)
Hemoglobin: 13.9 g/dL (ref 12.0–15.0)
Immature Granulocytes: 0 %
Lymphocytes Relative: 17 %
Lymphs Abs: 1 10*3/uL (ref 0.7–4.0)
MCH: 33.2 pg (ref 26.0–34.0)
MCHC: 32.5 g/dL (ref 30.0–36.0)
MCV: 102.1 fL — ABNORMAL HIGH (ref 80.0–100.0)
Monocytes Absolute: 0.5 10*3/uL (ref 0.1–1.0)
Monocytes Relative: 9 %
Neutro Abs: 4.3 10*3/uL (ref 1.7–7.7)
Neutrophils Relative %: 71 %
Platelets: 191 10*3/uL (ref 150–400)
RBC: 4.19 MIL/uL (ref 3.87–5.11)
RDW: 13.4 % (ref 11.5–15.5)
WBC: 6 10*3/uL (ref 4.0–10.5)
nRBC: 0 % (ref 0.0–0.2)

## 2023-09-02 LAB — COMPREHENSIVE METABOLIC PANEL WITH GFR
ALT: 11 U/L (ref 0–44)
AST: 23 U/L (ref 15–41)
Albumin: 3.8 g/dL (ref 3.5–5.0)
Alkaline Phosphatase: 105 U/L (ref 38–126)
Anion gap: 10 (ref 5–15)
BUN: 17 mg/dL (ref 8–23)
CO2: 25 mmol/L (ref 22–32)
Calcium: 9.1 mg/dL (ref 8.9–10.3)
Chloride: 98 mmol/L (ref 98–111)
Creatinine, Ser: 0.9 mg/dL (ref 0.44–1.00)
GFR, Estimated: >60 mL/min (ref >60)
Glucose, Bld: 95 mg/dL (ref 70–99)
Potassium: 4 mmol/L (ref 3.5–5.1)
Sodium: 133 mmol/L — ABNORMAL LOW (ref 135–145)
Total Bilirubin: 1.6 mg/dL — ABNORMAL HIGH (ref 0.0–1.2)
Total Protein: 7.4 g/dL (ref 6.5–8.1)

## 2023-09-02 LAB — TROPONIN I (HIGH SENSITIVITY)
Troponin I (High Sensitivity): 9 ng/L (ref <18)
Troponin I (High Sensitivity): 7 ng/L (ref <18)

## 2023-09-02 LAB — BRAIN NATRIURETIC PEPTIDE: B Natriuretic Peptide: 231 pg/mL — ABNORMAL HIGH (ref 0.0–100.0)

## 2023-09-02 NOTE — ED Notes (Signed)
 Pt given snack and soda per pt request prior to DC. Pt guardian notified of DC and aware and okayed pt driving self home with home O2.

## 2023-09-02 NOTE — Discharge Instructions (Addendum)
 Was a pleasure taking care of you today.  Your workup was reassuring.  You are not having significant heart failure.  You do not have a UTI, you do not have pneumonia or other signs of infection.  Please follow-up close with your primary care doctor if you develop any new or worsening symptoms please come back to the ER right away.  Please call your [facility] cardiologist for close outpatient follow-up.

## 2023-09-02 NOTE — ED Provider Notes (Signed)
 [place] EMERGENCY DEPARTMENT AT [facility] Provider Note   CSN: [id number] Arrival date & time: 09/02/23  1328     History  Chief Complaint  Patient presents with   Weakness    Cindy [name] is a 89 y.o. female.  She has a history of HFpEF, pulmonary embolism on chronic anticoagulation with Eliquis , paroxysmal A-fib, weight chain amyloidosis, GERD, fibrosis of the lungs and chronic respiratory failure on 2 L of oxygen  at baseline. Presents the ER today for generalized weakness and fatigue, has been going on for several months, worse in the past week.  She saw her oncologist at [facility] yesterday.  She states they told her they are going to see her treatment for her amyloidosis and can maintain her off treatment until she has recurrence of disease but she discussed with them her ongoing fatigue and generalized weakness.  She states they discussed with her that she should see her cardiologist that could be a problem with her heart failure.  She denies any increase in her baseline peripheral edema, denies any increase in shortness of breath, she is not having to use oxygen  more than usual, no chest pain, no dizziness.  She states that she has been more fatigued and twice in the past week she has had to take a nap around 10:00 in the morning for about 2 hours.  She states she has been eating and drinking well.  Has compliant with her medications.   Weakness      Home Medications Prior to Admission medications   Medication Sig Start Date End Date Taking? Authorizing Provider  acetaminophen  (TYLENOL ) 650 MG CR tablet Take 650 mg by mouth every 8 (eight) hours as needed for pain or fever.   Yes [provider]  albuterol  (PROVENTIL ) (2.5 MG/3ML) 0.083% nebulizer solution Take 3 mLs (2.5 mg total) by nebulization every 6 (six) hours as needed for wheezing or shortness of breath. 2-3 times a week 02/18/23  Yes Lind Repine, MD  ALPRAZolam  (XANAX ) 0.5 MG tablet Take 1  tablet (0.5 mg total) by mouth at bedtime as needed for anxiety. Patient taking differently: Take 0.5 mg by mouth at bedtime. 03/15/13  Yes Elton Ham, MD  apixaban  (ELIQUIS ) 5 MG TABS tablet Take 5 mg by mouth 2 (two) times daily.   Yes [provider]  BIOTIN PO Take 1 tablet by mouth daily.   Yes [provider]  Cholecalciferol  50 MCG (2000 UT) CAPS Take 1 capsule by mouth daily.   Yes [provider]  cyanocobalamin (VITAMIN B12) 1000 MCG tablet Take 1,000 mcg by mouth daily.   Yes [provider]  fentaNYL  (DURAGESIC ) 25 MCG/HR Place 1 patch onto the skin every 3 (three) days. 06/15/19  Yes [provider]  Fluocinolone Acetonide 0.01 % OIL Place 5 drops into both ears as needed (itching). As needed for itching 08/14/21  Yes [provider]  levothyroxine  (SYNTHROID ) 137 MCG tablet Take 137 mcg by mouth daily. 02/16/22  Yes [provider]  metoprolol  succinate (TOPROL -XL) 25 MG 24 hr tablet Take 0.5-1 tablets (12.5-25 mg total) by mouth in the morning and at bedtime. Patient takes 1 tablet (25mg ) in the morning and 1/2 of a tablet (12.5mg ) in the evening Patient taking differently: Take 12.5 mg by mouth daily. 07/10/19  Yes Justina Oman, MD  Multiple Vitamins-Minerals (PRESERVISION AREDS PO) Take 1 capsule by mouth daily.   Yes [provider]  NYAMYC powder Apply 1 Application topically  2 (two) times daily.   Yes [provider]  ondansetron  (ZOFRAN ) 4 MG tablet Take 4 mg by mouth every 8 (eight) hours as needed for nausea or vomiting. 08/03/19  Yes [provider]  Polyethyl Glycol-Propyl Glycol (SYSTANE OP) Apply 1 drop to eye daily.   Yes [provider]  potassium chloride  (KLOR-CON  M) 10 MEQ tablet Take 10 mEq by mouth daily. 02/05/22  Yes [provider]  Probiotic Product (ALIGN) 4 MG CAPS Take 1 tablet by mouth every morning.   Yes [provider]  senna (SENOKOT)  8.6 MG tablet Take 2 tablets by mouth as needed for constipation.   Yes [provider]  sodium chloride  (OCEAN) 0.65 % nasal spray Place 2 sprays into the nose daily as needed for congestion.   Yes [provider]  torsemide  (DEMADEX ) 20 MG tablet Take 1 tablet (20 mg total) by mouth daily. Patient taking differently: Take 30 mg by mouth daily. 09/19/22  Yes Tat, Myrtie Atkinson, MD      Allergies    Nebivolol hcl, Codeine, Sulfa antibiotics, and Sulfonamide derivatives    Review of Systems   Review of Systems  Neurological:  Positive for weakness.    Physical Exam Updated Vital Signs BP 105/62 (BP Location: Right Arm)   Pulse 76   Temp 98 F (36.7 C) (Oral)   Resp 15   Ht 5\' 7"  (1.702 m)   Wt 56.1 kg   SpO2 99%   BMI 19.37 kg/m  Physical Exam Vitals and nursing note reviewed.  Constitutional:      General: She is not in acute distress.    Appearance: She is well-developed.  HENT:     Head: Normocephalic and atraumatic.     Mouth/Throat:     Mouth: Mucous membranes are moist.  Eyes:     Conjunctiva/sclera: Conjunctivae normal.  Cardiovascular:     Rate and Rhythm: Normal rate and regular rhythm.     Heart sounds: No murmur heard. Pulmonary:     Effort: Pulmonary effort is normal. No respiratory distress.     Breath sounds: Normal breath sounds.  Abdominal:     Palpations: Abdomen is soft.     Tenderness: There is no abdominal tenderness.  Musculoskeletal:        General: No swelling.     Cervical back: Neck supple.     Right lower leg: Edema present.     Left lower leg: Edema present.  Skin:    General: Skin is warm and dry.     Capillary Refill: Capillary refill takes less than 2 seconds.  Neurological:     General: No focal deficit present.     Mental Status: She is alert and oriented to person, place, and time.  Psychiatric:        Mood and Affect: Mood normal.     ED Results / Procedures / Treatments   Labs (all labs ordered are listed, but  only abnormal results are displayed) Labs Reviewed  CBC WITH DIFFERENTIAL/PLATELET - Abnormal; Notable for the following components:      Result Value   MCV 102.1 (*)    All other components within normal limits  COMPREHENSIVE METABOLIC PANEL WITH GFR - Abnormal; Notable for the following components:   Sodium 133 (*)    Total Bilirubin 1.6 (*)    All other components within normal limits  BRAIN NATRIURETIC PEPTIDE - Abnormal; Notable for the following components:   B Natriuretic Peptide 231.0 (*)  All other components within normal limits  URINALYSIS, ROUTINE W REFLEX MICROSCOPIC - Abnormal; Notable for the following components:   Specific Gravity, Urine 1.004 (*)    Leukocytes,Ua TRACE (*)    All other components within normal limits  TROPONIN I (HIGH SENSITIVITY)  TROPONIN I (HIGH SENSITIVITY)    EKG EKG Interpretation Date/Time:  Thursday Sep 02 2023 15:48:42 EDT Ventricular Rate:  75 PR Interval:  287 QRS Duration:  109 QT Interval:  427 QTC Calculation: 477 R Axis:   -65  Text Interpretation: ATRIAL PACED RHYTHM Prolonged PR interval Probable left atrial enlargement Left anterior fascicular block Left ventricular hypertrophy Anterior infarct, old Confirmed by Annita Kindle [phone]) on 09/02/2023 5:19:21 PM  Radiology DG Chest Port 1 View Result Date: 09/02/2023 CLINICAL DATA:  Shortness of breath EXAM: PORTABLE CHEST 1 VIEW COMPARISON:  06/29/2023, chest CT 10/08/2022, 06/19/2021 FINDINGS: Left-sided pacing device as before. Mild diffuse reticular interstitial opacity, likely due to mild underlying chronic disease. Small right-sided pleural effusion. Cardiomegaly with vascular congestion and probable mild interstitial edema superimposed on underlying chronic change. Aortic atherosclerosis. No pneumothorax IMPRESSION: Cardiomegaly with suspected mild interstitial edema superimposed on underlying chronic change. Small right pleural effusion. Electronically Signed   By: Esmeralda Hedge M.D.   On: 09/02/2023 15:31    Procedures Procedures    Medications Ordered in ED Medications - No data to display  ED Course/ Medical Decision Making/ A&P Clinical Course as of 09/02/23 1955  Thu Sep 02, 2023  1719 Abs Immature Granulocytes: 0.01 [CB]    Clinical Course User Index [CB] Aimee Houseman, PA-C                                 Medical Decision Making This patient presents to the ED for concern of fatigue, this involves an extensive number of treatment options, and is a complaint that carries with it a high risk of complications and morbidity.  The differential diagnosis includes electrolyte imbalance, dehydration, symptomatic anemia, UTI, heart failure, pneumonia,   Co morbidities that complicate the patient evaluation  Chronic anticoagulant use, heart failure, A-fib, chronic PE, amyloidosis   Additional history obtained:  Additional history obtained from EMR External records from outside source obtained and reviewed including prior notes and labs   Lab Tests:  I Ordered, and personally interpreted labs.  The pertinent results include: Troponin negative x 2, BNP 231, CMP slightly elevated bilirubin 1.6, baseline is usually around 1.3 previously, CBC normal, UA normal   Imaging Studies ordered:  I ordered imaging studies including chest x-ray I independently visualized and interpreted imaging which showed mild interstitial edema I agree with the radiologist interpretation     Problem List / ED Course / Critical interventions / Medication management  Fatigue and generalized weakness-this is an ongoing problem, has been worse over the past week, seen by her oncologist yesterday who recommended cardiology follow-up.  Patient seen by [facility].  She states today she went to come in to make sure things okay with her heart.  She is not having increased edema in her legs, no rales on her exam, she is not hypoxic, she walked well on 2 L of oxygen  without  getting dyspneic or significantly hypoxic.  Blood pressure is normal, she is eating and drinking well, labs are all reassuring she has no abdominal pain or tenderness.  With patient that she has a reassuring workup, she should still follow-up with her  cardiologist at [facility] but at this time no medication changes needed.  She asked if she needed IV fluids.  Discussed with patient that she does not appear to be dehydrated and would worry about causing volume overload so we will not do IV fluids at this time.  I considered PE, however patient has no chest pain or shortness of breath, doing well on her baseline home oxygen  and is already on anticoagulation.  Do not feel she needs further workup for this.  I have reviewed the patients home medicines and have made adjustments as needed      Amount and/or Complexity of Data Reviewed Labs: ordered. Decision-making details documented in ED Course.           Final Clinical Impression(s) / ED Diagnoses Final diagnoses:  Weakness    Rx / DC Orders ED Discharge Orders     None         Joshua Nieves 09/02/23 Beverely Buba, MD 09/03/23 1622

## 2023-09-02 NOTE — ED Triage Notes (Signed)
 Pt arrived via POV from home c/o weakness for over past week. Pt reports concern her heart failure is causing her weakness. Pt presents on 2L Nasal Cannula at baseline.

## 2023-09-07 DIAGNOSIS — Z7901 Long term (current) use of anticoagulants: Secondary | ICD-10-CM | POA: Diagnosis not present

## 2023-09-07 DIAGNOSIS — I4891 Unspecified atrial fibrillation: Secondary | ICD-10-CM | POA: Diagnosis not present

## 2023-09-07 DIAGNOSIS — R531 Weakness: Secondary | ICD-10-CM | POA: Diagnosis not present

## 2023-09-08 ENCOUNTER — Ambulatory Visit: Attending: Internal Medicine | Admitting: Internal Medicine

## 2023-09-08 ENCOUNTER — Encounter: Payer: Self-pay | Admitting: Internal Medicine

## 2023-09-08 VITALS — BP 118/60 | HR 86 | Ht 67.5 in | Wt 128.2 lb

## 2023-09-08 DIAGNOSIS — Z79899 Other long term (current) drug therapy: Secondary | ICD-10-CM | POA: Insufficient documentation

## 2023-09-08 DIAGNOSIS — I4892 Unspecified atrial flutter: Secondary | ICD-10-CM | POA: Diagnosis not present

## 2023-09-08 DIAGNOSIS — I5030 Unspecified diastolic (congestive) heart failure: Secondary | ICD-10-CM | POA: Insufficient documentation

## 2023-09-08 MED ORDER — DAPAGLIFLOZIN PROPANEDIOL 10 MG PO TABS: 10.0000 mg | ORAL_TABLET | Freq: Every day | ORAL | 5 refills | Status: DC

## 2023-09-08 NOTE — Progress Notes (Unsigned)
 Cardiology Office Note  Date: 09/08/2023   ID: Cindy [name], Cindy [name] 26-Jan-1933, MRN [id number]  PCP:  Omie Bickers, MD  Cardiologist:  Lasalle Pointer, MD Electrophysiologist:  None   History of Present Illness: Cindy [name] is a 89 y.o. female known to have AL amyloidosis, amyloid cardiomyopathy, paroxysmal A-fib, AV block s/p [employer] dual-chamber PPM, pulmonary embolism, RV enlargement, questionable interstitial fibrosis is here to establish care.  Reviewed records from Care Everywhere.  She noticed worsening shortness of breath with exertion for the last 1 month.  Associated with generalized body weakness.  She went to the ER recently on 09/02/2023 and was told she was compensated.  I reviewed the chest x-ray performed on that day that showed pulmonary vascular congestion with mild cardiomegaly.  Total bilirubin is elevated, 1.6.  BNP was slightly elevated, 231.  She did have chronic elevation of BNP, 315 around May 2024.  She does have bilateral lower extremity swelling.  No angina.  Currently on home oxygen .  No dizziness, presyncope or syncope.  No palpitations.  Has been on home oxygen  for the last 1 year.  Her husband passed away around 1 and half year ago.  Past Medical History:  Diagnosis Date   Amyloidosis (HCC)    cancer   Cancer (HCC)    CHF (congestive heart failure) (HCC)    Amyloidosis   Diarrhea    Diverticulosis of colon (without mention of hemorrhage)    Edema    Esophagitis, unspecified    Family history of malignant neoplasm of gastrointestinal tract    Fibrosis of lung (HCC) 03/14/2013   Flatulence, eructation, and gas pain    GERD (gastroesophageal reflux disease)    Left sided ulcerative colitis (HCC)    Macular degeneration    left eye   Maxillary sinus mass    Osteoporosis, unspecified    Other and unspecified hyperlipidemia    Other and unspecified noninfectious gastroenteritis and colitis(558.9)    Paroxysmal atrial  fibrillation (HCC) 07/09/2019   Pneumonia    Stricture and stenosis of esophagus    Unspecified hypothyroidism    URI (upper respiratory infection)     Past Surgical History:  Procedure Laterality Date   ABDOMINAL HYSTERECTOMY     CATARACT EXTRACTION W/PHACO Bilateral    Dr. Gennie Kicks   CHOLECYSTECTOMY     THYROIDECTOMY, PARTIAL     YAG LASER APPLICATION Right 12/12/2013   Procedure: YAG LASER APPLICATION;  Surgeon: Lavonia Powers T. Gennie Kicks, MD;  Location: [facility];  Service: Ophthalmology;  Laterality: Right;    Current Outpatient Medications  Medication Sig Dispense Refill   acetaminophen  (TYLENOL ) 650 MG CR tablet Take 650 mg by mouth every 8 (eight) hours as needed for pain or fever.     albuterol  (PROVENTIL ) (2.5 MG/3ML) 0.083% nebulizer solution Take 3 mLs (2.5 mg total) by nebulization every 6 (six) hours as needed for wheezing or shortness of breath. 2-3 times a week 75 mL 0   ALPRAZolam  (XANAX ) 0.5 MG tablet Take 1 tablet (0.5 mg total) by mouth at bedtime as needed for anxiety. (Patient taking differently: Take 0.5 mg by mouth at bedtime.) 30 tablet 3   apixaban  (ELIQUIS ) 5 MG TABS tablet Take 5 mg by mouth 2 (two) times daily.     BIOTIN PO Take 1 tablet by mouth daily.     Cholecalciferol  50 MCG (2000 UT) CAPS Take 1 capsule by mouth daily.     cyanocobalamin (VITAMIN B12) 1000 MCG tablet Take  1,000 mcg by mouth daily.     fentaNYL  (DURAGESIC ) 25 MCG/HR Place 1 patch onto the skin every 3 (three) days.     Fluocinolone Acetonide 0.01 % OIL Place 5 drops into both ears as needed (itching). As needed for itching     levothyroxine  (SYNTHROID ) 137 MCG tablet Take 137 mcg by mouth daily.     metoprolol  succinate (TOPROL -XL) 25 MG 24 hr tablet Take 0.5-1 tablets (12.5-25 mg total) by mouth in the morning and at bedtime. Patient takes 1 tablet (25mg ) in the morning and 1/2 of a tablet (12.5mg ) in the evening (Patient taking differently: Take 12.5 mg by mouth daily.)     Multiple  Vitamins-Minerals (PRESERVISION AREDS PO) Take 1 capsule by mouth daily.     NYAMYC powder Apply 1 Application topically 2 (two) times daily.     ondansetron  (ZOFRAN ) 4 MG tablet Take 4 mg by mouth every 8 (eight) hours as needed for nausea or vomiting.     Polyethyl Glycol-Propyl Glycol (SYSTANE OP) Apply 1 drop to eye daily.     potassium chloride  (KLOR-CON  M) 10 MEQ tablet Take 10 mEq by mouth daily.     Probiotic Product (ALIGN) 4 MG CAPS Take 1 tablet by mouth every morning.     senna (SENOKOT) 8.6 MG tablet Take 2 tablets by mouth as needed for constipation.     sodium chloride  (OCEAN) 0.65 % nasal spray Place 2 sprays into the nose daily as needed for congestion.     torsemide  (DEMADEX ) 20 MG tablet Take 1 tablet (20 mg total) by mouth daily. (Patient taking differently: Take 30 mg by mouth daily.)     No current facility-administered medications for this visit.   Allergies:  Nebivolol hcl, Codeine, Sulfa antibiotics, and Sulfonamide derivatives   Social History: The patient  reports that she has never smoked. She has never been exposed to tobacco smoke. She has never used smokeless tobacco. She reports that she does not drink alcohol  and does not use drugs.   Family History: The patient's family history includes Adrenal disorder in her son; Colon cancer in her mother; Diabetes in her son; Heart attack in her father; Heart disease in her brother; Kidney cancer in her son; Seizures in her mother; Stomach cancer in her sister.   ROS:  Please see the history of present illness. Otherwise, complete review of systems is positive for none  All other systems are reviewed and negative.   Physical Exam: VS:  BP 118/60   Pulse 86   Ht 5' 7.5" (1.715 m)   Wt 128 lb 3.2 oz (58.2 kg)   SpO2 90%   BMI 19.78 kg/m , BMI Body mass index is 19.78 kg/m.  Wt Readings from Last 3 Encounters:  09/08/23 128 lb 3.2 oz (58.2 kg)  09/02/23 123 lb 10.9 oz (56.1 kg)  06/29/23 123 lb 9.6 oz (56.1 kg)     General: Patient appears comfortable at rest. HEENT: Conjunctiva and lids normal, oropharynx clear with moist mucosa. Neck: Supple, no elevated JVP or carotid bruits, no thyromegaly. Lungs: Clear to auscultation, nonlabored breathing at rest. Cardiac: Regular rate and rhythm, no S3 or significant systolic murmur, no pericardial rub. Abdomen: Soft, nontender, no hepatomegaly, bowel sounds present, no guarding or rebound. Extremities: No pitting edema, distal pulses 2+. Skin: Warm and dry. Musculoskeletal: No kyphosis. Neuropsychiatric: Alert and oriented x3, affect grossly appropriate.  Recent Labwork: 09/19/2022: Magnesium  1.7 09/02/2023: ALT 11; AST 23; B Natriuretic Peptide 231.0; BUN 17; Creatinine,  Ser 0.90; Hemoglobin 13.9; Platelets 191; Potassium 4.0; Sodium 133     Component Value Date/Time   CHOL 201 (H) 04/06/2014 0912   TRIG 73.0 04/06/2014 0912   HDL 39.30 04/06/2014 0912   CHOLHDL 5 04/06/2014 0912   VLDL 14.6 04/06/2014 0912   LDLCALC 147 (H) 04/06/2014 0912    Other Studies Reviewed Today:   Assessment and Plan:  Acute on chronic diastolic heart failure: Reports taking torsemide  20 mg twice daily, will continue and add Farxiga 10 mg once daily.  Obtain BMP in 5 days.  She has soft blood pressures, we will see her back in 1 month to reassess.  Echocardiogram performed in May 2024 showed normal LVEF, RV enlargement with decreased RV systolic function, moderate pulmonary hypertension and no valvular heart disease.         Medication Adjustments/Labs and Tests Ordered: Current medicines are reviewed at length with the patient today.  Concerns regarding medicines are outlined above.    Disposition:  Follow up 1 month  Signed Benay Pomeroy Priya Gal Feldhaus, MD, 09/08/2023 2:39 PM    [facility] [address] [place], [place], [place] [address]

## 2023-09-08 NOTE — Patient Instructions (Addendum)
 Medication Instructions:  Your physician has recommended you make the following change in your medication:  Start taking Farxiga 10 mg once daily Continue taking all other medications as prescribed  Labwork: BMET in one week at [facility]-05/28  Testing/Procedures: None  Follow-Up: Your physician recommends that you schedule a follow-up appointment in: 1 month  Any Other Special Instructions Will Be Listed Below (If Applicable). Thank you for choosing [facility]!     If you need a refill on your cardiac medications before your next appointment, please call your pharmacy.

## 2023-09-10 ENCOUNTER — Telehealth: Payer: Self-pay | Admitting: Internal Medicine

## 2023-09-10 NOTE — Telephone Encounter (Signed)
 Unable to reach patient by phone- called son (DPR) unable to reach by phone

## 2023-09-10 NOTE — Telephone Encounter (Signed)
 Per 5/21 OV note with provider:  Acute on chronic diastolic heart failure: Reports taking torsemide  20 mg twice daily, will continue and add Farxiga 10 mg once daily. Obtain BMP in 5 days. She has soft blood pressures, we will see her back in 1 month to reassess. Echocardiogram performed in May 2024 showed normal LVEF, RV enlargement with decreased RV systolic function, moderate pulmonary hypertension and no valvular heart disease.

## 2023-09-10 NOTE — Telephone Encounter (Signed)
 Pt c/o medication issue:  1. Name of Medication: dapagliflozin propanediol (FARXIGA) 10 MG TABS tablet   2. How are you currently taking this medication (dosage and times per day)? As written   3. Are you having a reaction (difficulty breathing--STAT)? No   4. What is your medication issue? Pt called in asking why did Dr. Mallipeddi want to start her on this medication, she stated she remembers discussing but she can't remember why. Please advise.

## 2023-09-14 DIAGNOSIS — H3321 Serous retinal detachment, right eye: Secondary | ICD-10-CM | POA: Diagnosis not present

## 2023-09-14 DIAGNOSIS — H353211 Exudative age-related macular degeneration, right eye, with active choroidal neovascularization: Secondary | ICD-10-CM | POA: Diagnosis not present

## 2023-09-14 DIAGNOSIS — H353221 Exudative age-related macular degeneration, left eye, with active choroidal neovascularization: Secondary | ICD-10-CM | POA: Diagnosis not present

## 2023-09-14 DIAGNOSIS — H353123 Nonexudative age-related macular degeneration, left eye, advanced atrophic without subfoveal involvement: Secondary | ICD-10-CM | POA: Diagnosis not present

## 2023-09-14 DIAGNOSIS — H43812 Vitreous degeneration, left eye: Secondary | ICD-10-CM | POA: Diagnosis not present

## 2023-09-14 DIAGNOSIS — H353114 Nonexudative age-related macular degeneration, right eye, advanced atrophic with subfoveal involvement: Secondary | ICD-10-CM | POA: Diagnosis not present

## 2023-09-14 DIAGNOSIS — H35352 Cystoid macular degeneration, left eye: Secondary | ICD-10-CM | POA: Diagnosis not present

## 2023-09-14 NOTE — Telephone Encounter (Signed)
 Reached recorded message asking who was speaking, not able to leave message.

## 2023-09-15 DIAGNOSIS — I5030 Unspecified diastolic (congestive) heart failure: Secondary | ICD-10-CM | POA: Diagnosis not present

## 2023-09-15 DIAGNOSIS — Z79899 Other long term (current) drug therapy: Secondary | ICD-10-CM | POA: Diagnosis not present

## 2023-09-16 DIAGNOSIS — G894 Chronic pain syndrome: Secondary | ICD-10-CM | POA: Diagnosis not present

## 2023-09-16 DIAGNOSIS — K5909 Other constipation: Secondary | ICD-10-CM | POA: Diagnosis not present

## 2023-09-16 DIAGNOSIS — J849 Interstitial pulmonary disease, unspecified: Secondary | ICD-10-CM | POA: Diagnosis not present

## 2023-09-16 DIAGNOSIS — D8989 Other specified disorders involving the immune mechanism, not elsewhere classified: Secondary | ICD-10-CM | POA: Diagnosis not present

## 2023-09-16 DIAGNOSIS — I509 Heart failure, unspecified: Secondary | ICD-10-CM | POA: Diagnosis not present

## 2023-09-16 DIAGNOSIS — K219 Gastro-esophageal reflux disease without esophagitis: Secondary | ICD-10-CM | POA: Diagnosis not present

## 2023-09-16 DIAGNOSIS — F411 Generalized anxiety disorder: Secondary | ICD-10-CM | POA: Diagnosis not present

## 2023-09-16 DIAGNOSIS — I272 Pulmonary hypertension, unspecified: Secondary | ICD-10-CM | POA: Diagnosis not present

## 2023-09-16 DIAGNOSIS — I2699 Other pulmonary embolism without acute cor pulmonale: Secondary | ICD-10-CM | POA: Diagnosis not present

## 2023-09-16 DIAGNOSIS — I4891 Unspecified atrial fibrillation: Secondary | ICD-10-CM | POA: Diagnosis not present

## 2023-09-16 DIAGNOSIS — R0982 Postnasal drip: Secondary | ICD-10-CM | POA: Diagnosis not present

## 2023-09-16 DIAGNOSIS — E039 Hypothyroidism, unspecified: Secondary | ICD-10-CM | POA: Diagnosis not present

## 2023-09-16 LAB — BASIC METABOLIC PANEL WITH GFR
BUN/Creatinine Ratio: 25 (ref 12–28)
BUN: 20 mg/dL (ref 8–27)
CO2: 19 mmol/L — ABNORMAL LOW (ref 20–29)
Calcium: 9.4 mg/dL (ref 8.7–10.3)
Chloride: 101 mmol/L (ref 96–106)
Creatinine, Ser: 0.79 mg/dL (ref 0.57–1.00)
Glucose: 103 mg/dL — ABNORMAL HIGH (ref 70–99)
Potassium: 4.7 mmol/L (ref 3.5–5.2)
Sodium: 140 mmol/L (ref 134–144)
eGFR: 72 mL/min/{1.73_m2} (ref >59)

## 2023-09-16 NOTE — Telephone Encounter (Signed)
 Reached recorded message asking who was speaking, not able to leave message.

## 2023-09-17 ENCOUNTER — Telehealth: Payer: Self-pay | Admitting: Internal Medicine

## 2023-09-17 DIAGNOSIS — L219 Seborrheic dermatitis, unspecified: Secondary | ICD-10-CM | POA: Diagnosis not present

## 2023-09-17 NOTE — Telephone Encounter (Signed)
  Pt c/o medication issue:  1. Name of Medication:   apixaban  (ELIQUIS ) 5 MG TABS tablet    2. How are you currently taking this medication (dosage and times per day)? As written   3. Are you having a reaction (difficulty breathing--STAT)? No   4. What is your medication issue? Brooke with [employer] partners called in stating they had nurse go out to pt's home and due to afib and PE they asked if its okay to reduce pt Eliquis  to 2.5 mg twice daily. Please advise.

## 2023-09-17 NOTE — Telephone Encounter (Signed)
 I spoke with son and verified phone ,it was wrong in our records, it should be [phone]   Patient had granddaughter look up farxiga and now understands it is for her heart.

## 2023-09-17 NOTE — Telephone Encounter (Signed)
 Prescription refill request for Eliquis  received. Indication:afib Last office visit:5/25 Scr:0.79  5/25 Age: 89 Weight:58.2  kg  Prescription under review

## 2023-09-18 DIAGNOSIS — I4891 Unspecified atrial fibrillation: Secondary | ICD-10-CM | POA: Diagnosis not present

## 2023-09-18 DIAGNOSIS — J849 Interstitial pulmonary disease, unspecified: Secondary | ICD-10-CM | POA: Diagnosis not present

## 2023-09-18 DIAGNOSIS — I272 Pulmonary hypertension, unspecified: Secondary | ICD-10-CM | POA: Diagnosis not present

## 2023-09-18 DIAGNOSIS — I509 Heart failure, unspecified: Secondary | ICD-10-CM | POA: Diagnosis not present

## 2023-09-21 ENCOUNTER — Other Ambulatory Visit: Payer: Self-pay

## 2023-09-21 MED ORDER — APIXABAN 2.5 MG PO TABS: 2.5000 mg | ORAL_TABLET | Freq: Two times a day (BID) | ORAL | 1 refills | Status: AC

## 2023-09-24 ENCOUNTER — Ambulatory Visit: Payer: Self-pay | Admitting: Internal Medicine

## 2023-09-27 NOTE — Telephone Encounter (Signed)
-----   Message from Vishnu P Mallipeddi sent at 09/24/2023 11:45 AM EDT ----- Normal serum creatinine, continue Farxiga .  If she has DOE or weight gain, recommend to take an additional dose of torsemide .

## 2023-09-27 NOTE — Telephone Encounter (Signed)
 The patient has been notified of the result and verbalized understanding.  All questions (if any) were answered. Cindy [name], [place] 09/27/2023 3:44 PM

## 2023-09-29 ENCOUNTER — Ambulatory Visit (INDEPENDENT_AMBULATORY_CARE_PROVIDER_SITE_OTHER): Admitting: Primary Care

## 2023-09-29 ENCOUNTER — Encounter: Payer: Self-pay | Admitting: Primary Care

## 2023-09-29 VITALS — BP 109/67 | HR 99 | Ht 67.0 in | Wt 126.4 lb

## 2023-09-29 DIAGNOSIS — R531 Weakness: Secondary | ICD-10-CM | POA: Diagnosis not present

## 2023-09-29 DIAGNOSIS — R5383 Other fatigue: Secondary | ICD-10-CM

## 2023-09-29 DIAGNOSIS — J90 Pleural effusion, not elsewhere classified: Secondary | ICD-10-CM | POA: Diagnosis not present

## 2023-09-29 DIAGNOSIS — J9611 Chronic respiratory failure with hypoxia: Secondary | ICD-10-CM

## 2023-09-29 DIAGNOSIS — I2699 Other pulmonary embolism without acute cor pulmonale: Secondary | ICD-10-CM | POA: Diagnosis not present

## 2023-09-29 DIAGNOSIS — E854 Organ-limited amyloidosis: Secondary | ICD-10-CM

## 2023-09-29 DIAGNOSIS — R600 Localized edema: Secondary | ICD-10-CM

## 2023-09-29 DIAGNOSIS — I272 Pulmonary hypertension, unspecified: Secondary | ICD-10-CM | POA: Diagnosis not present

## 2023-09-29 NOTE — Progress Notes (Signed)
 @Patient  ID: Cindy [name], female    DOB: 03/01/1933, 89 y.o.   MRN: [id number]  No chief complaint on file.   Referring provider: Shona Norleen PEDLAR, MD  HPI: 89 yo never smoker with cardiac amyloid and pulmonary hypertension for FU ofchronic hypoxic respiratory failure. Per last evaluation at [facility] pulmonology , ILD was stable and unlikely to be pulmonary amyloid,?  Sequelae of aspiration was possible due to patulous esophagus.  Dyspnea was attributed to pulmonary hypertension 6-minute walk was about 200 m She does not have significant ILD on HRCT 11/2021 ,decreased DLCO is due to pulmonary hypertension - WHO 2.    Previous follow-up with [facility] cardiology- Dr Francina, [facility] amyloid clinic Dr. Volanda, previous pulmonary evaluation by Dr. Diana at [facility] and my partner Dr. Darlean She was diagnosed with cardiac amyloid in 2016 and was on hospice for a while with right Pleurx catheter but now has been remarkably stable for many years, and off medication since 2020.  Remains on fentanyl  patch. she was diagnosed with pulmonary emboli in 2021 & 2022 and is on lifetime anticoagulation She has chronic lower extremity edema since 2021 due to right heart failure and lymphedema     PMH  - HFpEF - Afib s/p ablation and DCCV (2016), AV block requiring dual lead pacemaker (03/2017), - Cardiac amyloid not on current medications, with preserved EF but restricitive CM/ restrictive LV filling pattern -being followed by [facility] --> 2016 - bortezomid/cytoxan  and VITAL study --> 2019/2020 - Isatuximab, stopped per patient - Pulmonary embolism x2 --> Acute PE 08/16/20 in RUL and RML and in 07/08/2019 in the right main pulm artery and LUL - Macular degeneration -lymphedema -remote h/o ulcerative colitis  The patient, with a history of pulmonary hypertension and chronic hypoxia, presents with a three-week history of persistent cough and difficulty breathing. The patient describes the cough as productive,  with deep yellow and slightly reddish phlegm. The patient denies any visible blood in the phlegm. The symptoms were initially thought to be due to a cold, but have persisted despite treatment with two courses of antibiotics and a full bottle of Mucinex . The patient reports significant improvement in symptoms with this treatment, estimating a 75% improvement compared to the onset of symptoms.  The patient also reports chronic fluid retention and leg swelling, which she manages with 1.5 tablets of torsemide  daily. She notes that the swelling fluctuates and is particularly noticeable in her legs. She also mentions some varicose veins in her legs. The patient has been managing her fluid retention by skipping her diuretic on days when she has to leave the house for extended periods, such as for church.   Acute Bronchitis She has been experiencing symptoms consistent with acute bronchitis, including a persistent cough with colored phlegm, for the past three weeks. Antibiotic treatment has led to significant improvement, with subsided cough and improved breathing. Although there is concern for possible pneumonia, symptom improvement suggests this is less likely. - Order chest x-ray >> no pneumonia , effusions to my review - Continue current antibiotics as prescribed   Pulmonary Hypertension Pulmonary hypertension contributes to her chronic hypoxia, requiring ongoing management and monitoring.She experiences fluid retention, particularly in her legs, managed with torsemide . She reports some improvement but continues to have fluid buildup. She takes 1.5 pills of torsemide  daily and occasionally skips doses for convenience. - Continue current management plan for pulmonary hypertension - Monitor oxygen  levels and adjust oxygen  therapy as needed - Continue torsemide , 1.5 pills  daily - Monitor fluid status and adjust diuretic therapy as needed   Chronic resp failure with  Hypoxia Chronic hypoxia, likely  secondary to pulmonary hypertension, is managed with supplemental oxygen . An incident of oxygen  saturation dropping to 77% was possibly due to nasal cannula displacement. Proper use of supplemental oxygen  is crucial. - Ensure proper use of supplemental oxygen     09/29/2023- Interim hx Discussed the use of AI scribe software for clinical note transcription with the patient, who gave verbal consent to proceed.  History of Present Illness   Cindy [name] is an 89 year old female with cardiac amyloidosis, pulmonary hypertension, and chronic respiratory failure who presents for 3 month follow-up of her respiratory conditions.   She has a history of cardiac amyloidosis, pulmonary hypertension, and chronic respiratory failure. She is on oxygen  therapy, using two liters at home, and increases to two and a half liters due to the long cord that curls up. Her lung changes have been stable according to [facility] pulmonary interstitial lung disease specialists. She experiences fatigue and weakness, which vary day by day, and often falls asleep during the day when inactive. She sleeps well at night but sometimes wakes up with a dry mouth. No snoring or waking up gasping or choking.  She has chronic lower extremity edema since 2021 due to heart failure and lymphedema. She is on torsemide  for diuresis and reports swelling in her legs. She is also on Eliquis  for a history of two pulmonary emboli. Her weight has fluctuated, and she recently had to adjust her Eliquis  dose due to weight loss.  History of cardiomegaly, mild interstitial edema, and a small pleural effusion as of Sep 02, 2023. Previous had indwelling pleurx catheter for over a year.   She uses a nebulizer to manage mucus production and reports that it helps loosen mucus for expectoration.      Significant tests/ events reviewed   Amb sat 06/2021 >> desat to 93% on walking at slow pace   Echo 06/2022 at [facility] showed moderate TR but normal RV  function   Echo 06/2021 RVSP elevated at 51 mm 12/2020 TTE at [facility] ,estimated RVSP  50 mmHg, mod TR, gr 2 DD   12/2020 VQ neg     HRCT chest 09/2022 >> Chronic patchy centrilobular micronodularity and faint patchy GGO throughout both lungs, unchanged, differential includes plexogenic pulmonary arteriopathy from chronic pulmonary hypertension with superimposed mild pulmonary edema, versus chronic hypersensitivity pneumonitis. Stable small loculated basilar right pleural effusion. Stable thick bandlike consolidation in the posterior lungs, right greater than left, favoring bland postinfectious scarring     HRCT chest 11/2021 bandlike consolidation has decreased bilaterally, stable small Rt effusion loculated   HRCT 11/2020  Mild peripheral reticulation in left upper lobe and superior segment of left lower lobe and posterior right upper lobe, may represent early interstitial lung abnormalities vs sequelae of prior aspiration.  Curvilinear scarring in right lower lobe.   Cardiomegaly and dilated main pulmonary artery measuring 3.5 cm, may represent elevated pulmonary arterial pressures.  Small right pleural effusion.   PFTs  - pfts 04/04/14  VC  2.10 (66%) no airflow obst   DLC0 48%   12/16/2020 FVC (% Pred) 2.05 (76.5) FEV1 (% Pred)  1.69 (84.5) DLCO  38.6% , TLC 88%  6 Minute Walk:  130m, O2 nadir 91% on room air    Allergies  Allergen Reactions   Nebivolol Hcl Shortness Of Breath, Other (See Comments) and Cough    Chest  tightness, severe tiredness, confusion   Codeine Nausea Only   Sulfa Antibiotics Nausea Only   Sulfonamide Derivatives Nausea Only    Immunization History  Administered Date(s) Administered   Fluad Quad(high Dose 65+) 01/19/2022, 01/20/2022   Fluad Trivalent(High Dose 65+) 01/22/2023, 02/12/2023   Influenza, High Dose Seasonal PF 01/25/2020, 02/14/2021   Influenza,inj,Quad PF,6+ Mos 02/19/2014   Influenza-Unspecified 03/17/2014, 01/19/2015, 01/19/2016, 01/28/2017,  01/18/2018, 04/15/2018, 01/08/2019, 02/06/2019   Tdap 01/09/2021   Unspecified SARS-COV-2 Vaccination 06/02/2019, 06/30/2019, 01/28/2022    Past Medical History:  Diagnosis Date   Amyloidosis (HCC)    cancer   Cancer (HCC)    CHF (congestive heart failure) (HCC)    Amyloidosis   Diarrhea    Diverticulosis of colon (without mention of hemorrhage)    Edema    Esophagitis, unspecified    Family history of malignant neoplasm of gastrointestinal tract    Fibrosis of lung (HCC) 03/14/2013   Flatulence, eructation, and gas pain    GERD (gastroesophageal reflux disease)    Left sided ulcerative colitis (HCC)    Macular degeneration    left eye   Maxillary sinus mass    Osteoporosis, unspecified    Other and unspecified hyperlipidemia    Other and unspecified noninfectious gastroenteritis and colitis(558.9)    Paroxysmal atrial fibrillation (HCC) 07/09/2019   Pneumonia    Stricture and stenosis of esophagus    Unspecified hypothyroidism    URI (upper respiratory infection)     Tobacco History: Social History   Tobacco Use  Smoking Status Never   Passive exposure: Never  Smokeless Tobacco Never   Counseling given: Not Answered   Outpatient Medications Prior to Visit  Medication Sig Dispense Refill   clobetasol (TEMOVATE) 0.05 % external solution Apply 1 Application topically 2 (two) times daily.     acetaminophen  (TYLENOL ) 650 MG CR tablet Take 650 mg by mouth every 8 (eight) hours as needed for pain or fever.     albuterol  (PROVENTIL ) (2.5 MG/3ML) 0.083% nebulizer solution Take 3 mLs (2.5 mg total) by nebulization every 6 (six) hours as needed for wheezing or shortness of breath. 2-3 times a week 75 mL 0   ALPRAZolam  (XANAX ) 0.5 MG tablet Take 1 tablet (0.5 mg total) by mouth at bedtime as needed for anxiety. (Patient taking differently: Take 0.5 mg by mouth at bedtime.) 30 tablet 3   apixaban  (ELIQUIS ) 2.5 MG TABS tablet Take 1 tablet (2.5 mg total) by mouth 2 (two) times  daily. 180 tablet 1   BIOTIN PO Take 1 tablet by mouth daily.     Cholecalciferol  50 MCG (2000 UT) CAPS Take 1 capsule by mouth daily.     cyanocobalamin (VITAMIN B12) 1000 MCG tablet Take 1,000 mcg by mouth daily.     dapagliflozin  propanediol (FARXIGA ) 10 MG TABS tablet Take 1 tablet (10 mg total) by mouth daily. 30 tablet 5   fentaNYL  (DURAGESIC ) 25 MCG/HR Place 1 patch onto the skin every 3 (three) days.     Fluocinolone Acetonide 0.01 % OIL Place 5 drops into both ears as needed (itching). As needed for itching     levothyroxine  (SYNTHROID ) 137 MCG tablet Take 137 mcg by mouth daily.     metoprolol  succinate (TOPROL -XL) 25 MG 24 hr tablet Take 0.5-1 tablets (12.5-25 mg total) by mouth in the morning and at bedtime. Patient takes 1 tablet (25mg ) in the morning and 1/2 of a tablet (12.5mg ) in the evening (Patient taking differently: Take 12.5 mg by mouth daily.)  Multiple Vitamins-Minerals (PRESERVISION AREDS PO) Take 1 capsule by mouth daily.     NYAMYC powder Apply 1 Application topically 2 (two) times daily.     ondansetron  (ZOFRAN ) 4 MG tablet Take 4 mg by mouth every 8 (eight) hours as needed for nausea or vomiting.     Polyethyl Glycol-Propyl Glycol (SYSTANE OP) Apply 1 drop to eye daily.     potassium chloride  (KLOR-CON  M) 10 MEQ tablet Take 10 mEq by mouth daily.     Probiotic Product (ALIGN) 4 MG CAPS Take 1 tablet by mouth every morning.     senna (SENOKOT) 8.6 MG tablet Take 2 tablets by mouth as needed for constipation.     sodium chloride  (OCEAN) 0.65 % nasal spray Place 2 sprays into the nose daily as needed for congestion.     torsemide  (DEMADEX ) 20 MG tablet Take 1 tablet (20 mg total) by mouth daily. (Patient taking differently: Take 30 mg by mouth daily.)     No facility-administered medications prior to visit.   Review of Systems  Review of Systems  Constitutional:  Positive for fatigue.  Respiratory:  Negative for shortness of breath.   Psychiatric/Behavioral:   Negative for sleep disturbance.    Physical Exam  There were no vitals taken for this visit. Physical Exam Constitutional:      Appearance: Normal appearance. She is not ill-appearing.  HENT:     Head: Normocephalic and atraumatic.  Cardiovascular:     Rate and Rhythm: Normal rate and regular rhythm.  Pulmonary:     Effort: Pulmonary effort is normal.     Breath sounds: Normal breath sounds.  Musculoskeletal:     Right lower leg: Edema present.     Left lower leg: Edema present.  Neurological:     General: No focal deficit present.     Mental Status: She is alert and oriented to person, place, and time. Mental status is at baseline.  Psychiatric:        Mood and Affect: Mood normal.        Behavior: Behavior normal.        Thought Content: Thought content normal.        Judgment: Judgment normal.      Lab Results:  CBC    Component Value Date/Time   WBC 6.0 09/02/2023 1438   RBC 4.19 09/02/2023 1438   HGB 13.9 09/02/2023 1438   HCT 42.8 09/02/2023 1438   PLT 191 09/02/2023 1438   MCV 102.1 (H) 09/02/2023 1438   MCH 33.2 09/02/2023 1438   MCHC 32.5 09/02/2023 1438   RDW 13.4 09/02/2023 1438   LYMPHSABS 1.0 09/02/2023 1438   MONOABS 0.5 09/02/2023 1438   EOSABS 0.1 09/02/2023 1438   BASOSABS 0.1 09/02/2023 1438    BMET    Component Value Date/Time   NA 140 09/15/2023 1248   K 4.7 09/15/2023 1248   CL 101 09/15/2023 1248   CO2 19 (L) 09/15/2023 1248   GLUCOSE 103 (H) 09/15/2023 1248   GLUCOSE 95 09/02/2023 1438   BUN 20 09/15/2023 1248   CREATININE 0.79 09/15/2023 1248   CREATININE 0.61 07/15/2012 1107   CALCIUM  9.4 09/15/2023 1248   GFRNONAA >60 09/02/2023 1438   GFRAA >60 11/06/2019 1227    BNP    Component Value Date/Time   BNP 231.0 (H) 09/02/2023 1438    ProBNP    Component Value Date/Time   PROBNP 193.0 (H) 03/26/2014 1227    Imaging: DG Chest Port 1 View Result  Date: 09/02/2023 CLINICAL DATA:  Shortness of breath EXAM: PORTABLE CHEST  1 VIEW COMPARISON:  06/29/2023, chest CT 10/08/2022, 06/19/2021 FINDINGS: Left-sided pacing device as before. Mild diffuse reticular interstitial opacity, likely due to mild underlying chronic disease. Small right-sided pleural effusion. Cardiomegaly with vascular congestion and probable mild interstitial edema superimposed on underlying chronic change. Aortic atherosclerosis. No pneumothorax IMPRESSION: Cardiomegaly with suspected mild interstitial edema superimposed on underlying chronic change. Small right pleural effusion. Electronically Signed   By: Luke Bun M.D.   On: 09/02/2023 15:31     Assessment & Plan:    Assessment and Plan    Pulmonary hypertension Secondary to chronic respiratory failure, experiences shortness of breath and fatigue. Continue diuretics and oxygen .   Chronic respiratory failure Chronic respiratory failure managed with supplemental oxygen  at 2 liters per minute, increased to 2.5 liters at home due to long tubing.  Pleural effusion Pleural effusion with past home drainage. Recent chest X-ray on May 15 showed mild interstitial edema and small pleural effusion. Continue to monitor.   Cardiac amyloidosis Cardiac amyloidosis managed with follow-up at [facility] with oncologist Dr. Letta.  Pulmonary embolism Continue Eliquis  as directed   Chronic lower extremity edema Chronic lower extremity edema since 2021 due to heart failure and lymphedema, managed with torsemide .  Fatigue and weakness Intermittent fatigue and weakness likely related to heart failure. No evidence of sleep apnea or sudden sleep attacks. Experiences daytime sleepiness when inactive. No indication for sleep study, fatigue felt to be multifactorial due to other numerous medical conditions.    Recording duration: 22 minutes      Almarie LELON Ferrari, NP 09/29/2023

## 2023-09-29 NOTE — Patient Instructions (Addendum)
   YOUR PLAN: -PULMONARY HYPERTENSION: Pulmonary hypertension is high blood pressure in the lungs' arteries, often causing shortness of breath and fatigue. Continue diuretics and oxygen .   -CHRONIC RESPIRATORY FAILURE: Chronic respiratory failure is a long-term condition where your lungs can't get enough oxygen  into your blood. You should continue using your supplemental oxygen  at 2 liters per minute, increasing to 2.5 liters at home due to the long tubing.  -PLEURAL EFFUSION: Pleural effusion is the buildup of fluid between the tissues that line the lungs and the chest. Your recent chest X-ray showed mild interstitial edema and a small pleural effusion, which we will continue to monitor.  -CARDIAC AMYLOIDOSIS: Cardiac amyloidosis is a condition where abnormal protein deposits in the heart tissue, affecting its function. You should continue your follow-up care with Dr. Lulla Salen at the [facility].  -PULMONARY EMBOLISM: A pulmonary embolism is a blood clot in the lungs. You are currently taking Eliquis , and your dosage has been adjusted to 2.5 mg due to weight loss. Continue taking your medication as prescribed.  -CHRONIC LOWER EXTREMITY EDEMA: Chronic lower extremity edema is swelling in the lower legs, often due to heart failure and lymphedema. You are taking torsemide  to manage this condition. We will refer you to a vascular specialist for further evaluation of your leg swelling.  -FATIGUE AND WEAKNESS: Fatigue and weakness can be symptoms of heart failure. You experience daytime sleepiness when inactive but do not have signs of sleep apnea. We will continue to monitor these symptoms.  INSTRUCTIONS: Please continue your ongoing management of pulmonary conditions with Dr. Villa Greaser. We will send a message to Dr. Alvaa with your current status and any recommendations. Additionally, follow up with a vascular specialist for evaluation of your leg swelling.   Follow-up: 6 months with APP or Dr.  Waymond Hailey in [place]- 30 mins

## 2023-10-01 DIAGNOSIS — K5909 Other constipation: Secondary | ICD-10-CM | POA: Diagnosis not present

## 2023-10-01 DIAGNOSIS — I2699 Other pulmonary embolism without acute cor pulmonale: Secondary | ICD-10-CM | POA: Diagnosis not present

## 2023-10-01 DIAGNOSIS — G894 Chronic pain syndrome: Secondary | ICD-10-CM | POA: Diagnosis not present

## 2023-10-01 DIAGNOSIS — F411 Generalized anxiety disorder: Secondary | ICD-10-CM | POA: Diagnosis not present

## 2023-10-01 DIAGNOSIS — I509 Heart failure, unspecified: Secondary | ICD-10-CM | POA: Diagnosis not present

## 2023-10-01 DIAGNOSIS — R0982 Postnasal drip: Secondary | ICD-10-CM | POA: Diagnosis not present

## 2023-10-01 DIAGNOSIS — J849 Interstitial pulmonary disease, unspecified: Secondary | ICD-10-CM | POA: Diagnosis not present

## 2023-10-01 DIAGNOSIS — I272 Pulmonary hypertension, unspecified: Secondary | ICD-10-CM | POA: Diagnosis not present

## 2023-10-01 DIAGNOSIS — K219 Gastro-esophageal reflux disease without esophagitis: Secondary | ICD-10-CM | POA: Diagnosis not present

## 2023-10-01 DIAGNOSIS — E039 Hypothyroidism, unspecified: Secondary | ICD-10-CM | POA: Diagnosis not present

## 2023-10-01 DIAGNOSIS — D8989 Other specified disorders involving the immune mechanism, not elsewhere classified: Secondary | ICD-10-CM | POA: Diagnosis not present

## 2023-10-01 DIAGNOSIS — I4891 Unspecified atrial fibrillation: Secondary | ICD-10-CM | POA: Diagnosis not present

## 2023-10-11 ENCOUNTER — Telehealth: Payer: Self-pay | Admitting: Primary Care

## 2023-10-11 NOTE — Telephone Encounter (Signed)
-----   Message from [place] V. Alva sent at 09/29/2023  6:07 PM EDT ----- Regarding: RE: cardiac amyloid, PH No need for sleep study.  She has multiple other issues that can certainly cause fatigue ----- Message ----- From: Hope Almarie ORN, NP Sent: 09/29/2023   2:42 PM EDT To: Harden Jude GAILS, MD Subject: cardiac amyloid, PH                            Cardiac amyloid C/o intermittent fatigue/weakness, falls asleep easily when inactive She has heart failure and PH Compliant with diuretics and oxygen  2-2.5L Weight is stable 123 (home); 126lbs today in the office CXR May 15th showed cardiomegaly with mild interstitial edema superimposed on underlying chronic change. Small right pleural effusion. Do you want me to work of fatigue with sleep study- she sleeps well at night, no snoring/gasping. Has dry mouth. Denies sudden sleep attacks. Eworth score 8 She is seeing vascular specialist for lymphedema  Interested in rehab but only at home   Ill send you my note Not changing anything today  -[employer]

## 2023-10-11 NOTE — Telephone Encounter (Signed)
 I called and spoke to pt. Pt informed of Beth's note and verbalized understanding. NFN

## 2023-10-11 NOTE — Telephone Encounter (Signed)
 Please let patient know Dr. Jude did not fee the need to get sleep study.

## 2023-10-12 DIAGNOSIS — H353221 Exudative age-related macular degeneration, left eye, with active choroidal neovascularization: Secondary | ICD-10-CM | POA: Diagnosis not present

## 2023-10-12 DIAGNOSIS — H353114 Nonexudative age-related macular degeneration, right eye, advanced atrophic with subfoveal involvement: Secondary | ICD-10-CM | POA: Diagnosis not present

## 2023-10-12 DIAGNOSIS — H35352 Cystoid macular degeneration, left eye: Secondary | ICD-10-CM | POA: Diagnosis not present

## 2023-10-12 DIAGNOSIS — H43812 Vitreous degeneration, left eye: Secondary | ICD-10-CM | POA: Diagnosis not present

## 2023-10-12 DIAGNOSIS — H353211 Exudative age-related macular degeneration, right eye, with active choroidal neovascularization: Secondary | ICD-10-CM | POA: Diagnosis not present

## 2023-10-12 DIAGNOSIS — H3321 Serous retinal detachment, right eye: Secondary | ICD-10-CM | POA: Diagnosis not present

## 2023-10-12 DIAGNOSIS — H353123 Nonexudative age-related macular degeneration, left eye, advanced atrophic without subfoveal involvement: Secondary | ICD-10-CM | POA: Diagnosis not present

## 2023-10-18 ENCOUNTER — Ambulatory Visit: Admitting: Internal Medicine

## 2023-10-18 DIAGNOSIS — I272 Pulmonary hypertension, unspecified: Secondary | ICD-10-CM | POA: Diagnosis not present

## 2023-10-18 DIAGNOSIS — J849 Interstitial pulmonary disease, unspecified: Secondary | ICD-10-CM | POA: Diagnosis not present

## 2023-10-18 DIAGNOSIS — E039 Hypothyroidism, unspecified: Secondary | ICD-10-CM | POA: Diagnosis not present

## 2023-10-18 DIAGNOSIS — I509 Heart failure, unspecified: Secondary | ICD-10-CM | POA: Diagnosis not present

## 2023-10-21 DIAGNOSIS — R399 Unspecified symptoms and signs involving the genitourinary system: Secondary | ICD-10-CM | POA: Diagnosis not present

## 2023-10-26 DIAGNOSIS — R0982 Postnasal drip: Secondary | ICD-10-CM | POA: Diagnosis not present

## 2023-10-26 DIAGNOSIS — I2699 Other pulmonary embolism without acute cor pulmonale: Secondary | ICD-10-CM | POA: Diagnosis not present

## 2023-10-26 DIAGNOSIS — J849 Interstitial pulmonary disease, unspecified: Secondary | ICD-10-CM | POA: Diagnosis not present

## 2023-10-26 DIAGNOSIS — G894 Chronic pain syndrome: Secondary | ICD-10-CM | POA: Diagnosis not present

## 2023-10-26 DIAGNOSIS — E039 Hypothyroidism, unspecified: Secondary | ICD-10-CM | POA: Diagnosis not present

## 2023-10-26 DIAGNOSIS — I272 Pulmonary hypertension, unspecified: Secondary | ICD-10-CM | POA: Diagnosis not present

## 2023-10-26 DIAGNOSIS — K5909 Other constipation: Secondary | ICD-10-CM | POA: Diagnosis not present

## 2023-10-26 DIAGNOSIS — F411 Generalized anxiety disorder: Secondary | ICD-10-CM | POA: Diagnosis not present

## 2023-10-26 DIAGNOSIS — K219 Gastro-esophageal reflux disease without esophagitis: Secondary | ICD-10-CM | POA: Diagnosis not present

## 2023-10-26 DIAGNOSIS — D8989 Other specified disorders involving the immune mechanism, not elsewhere classified: Secondary | ICD-10-CM | POA: Diagnosis not present

## 2023-10-26 DIAGNOSIS — I4891 Unspecified atrial fibrillation: Secondary | ICD-10-CM | POA: Diagnosis not present

## 2023-10-26 DIAGNOSIS — I509 Heart failure, unspecified: Secondary | ICD-10-CM | POA: Diagnosis not present

## 2023-10-28 DIAGNOSIS — Z7901 Long term (current) use of anticoagulants: Secondary | ICD-10-CM | POA: Diagnosis not present

## 2023-10-28 DIAGNOSIS — I071 Rheumatic tricuspid insufficiency: Secondary | ICD-10-CM | POA: Diagnosis not present

## 2023-10-28 DIAGNOSIS — I484 Atypical atrial flutter: Secondary | ICD-10-CM | POA: Diagnosis not present

## 2023-10-28 DIAGNOSIS — Z45018 Encounter for adjustment and management of other part of cardiac pacemaker: Secondary | ICD-10-CM | POA: Diagnosis not present

## 2023-10-28 DIAGNOSIS — I429 Cardiomyopathy, unspecified: Secondary | ICD-10-CM | POA: Diagnosis not present

## 2023-10-28 DIAGNOSIS — I442 Atrioventricular block, complete: Secondary | ICD-10-CM | POA: Diagnosis not present

## 2023-10-28 DIAGNOSIS — I5030 Unspecified diastolic (congestive) heart failure: Secondary | ICD-10-CM | POA: Diagnosis not present

## 2023-10-28 DIAGNOSIS — I43 Cardiomyopathy in diseases classified elsewhere: Secondary | ICD-10-CM | POA: Diagnosis not present

## 2023-10-28 DIAGNOSIS — I340 Nonrheumatic mitral (valve) insufficiency: Secondary | ICD-10-CM | POA: Diagnosis not present

## 2023-10-28 DIAGNOSIS — I491 Atrial premature depolarization: Secondary | ICD-10-CM | POA: Diagnosis not present

## 2023-10-28 DIAGNOSIS — R002 Palpitations: Secondary | ICD-10-CM | POA: Diagnosis not present

## 2023-11-09 DIAGNOSIS — H35352 Cystoid macular degeneration, left eye: Secondary | ICD-10-CM | POA: Diagnosis not present

## 2023-11-09 DIAGNOSIS — H353221 Exudative age-related macular degeneration, left eye, with active choroidal neovascularization: Secondary | ICD-10-CM | POA: Diagnosis not present

## 2023-11-09 DIAGNOSIS — H353114 Nonexudative age-related macular degeneration, right eye, advanced atrophic with subfoveal involvement: Secondary | ICD-10-CM | POA: Diagnosis not present

## 2023-11-09 DIAGNOSIS — H43812 Vitreous degeneration, left eye: Secondary | ICD-10-CM | POA: Diagnosis not present

## 2023-11-09 DIAGNOSIS — H353123 Nonexudative age-related macular degeneration, left eye, advanced atrophic without subfoveal involvement: Secondary | ICD-10-CM | POA: Diagnosis not present

## 2023-11-09 DIAGNOSIS — H3321 Serous retinal detachment, right eye: Secondary | ICD-10-CM | POA: Diagnosis not present

## 2023-11-09 DIAGNOSIS — H353211 Exudative age-related macular degeneration, right eye, with active choroidal neovascularization: Secondary | ICD-10-CM | POA: Diagnosis not present

## 2023-11-16 DIAGNOSIS — R0982 Postnasal drip: Secondary | ICD-10-CM | POA: Diagnosis not present

## 2023-11-16 DIAGNOSIS — D8989 Other specified disorders involving the immune mechanism, not elsewhere classified: Secondary | ICD-10-CM | POA: Diagnosis not present

## 2023-11-16 DIAGNOSIS — I509 Heart failure, unspecified: Secondary | ICD-10-CM | POA: Diagnosis not present

## 2023-11-16 DIAGNOSIS — K5909 Other constipation: Secondary | ICD-10-CM | POA: Diagnosis not present

## 2023-11-16 DIAGNOSIS — I2699 Other pulmonary embolism without acute cor pulmonale: Secondary | ICD-10-CM | POA: Diagnosis not present

## 2023-11-16 DIAGNOSIS — E039 Hypothyroidism, unspecified: Secondary | ICD-10-CM | POA: Diagnosis not present

## 2023-11-16 DIAGNOSIS — I272 Pulmonary hypertension, unspecified: Secondary | ICD-10-CM | POA: Diagnosis not present

## 2023-11-16 DIAGNOSIS — I4891 Unspecified atrial fibrillation: Secondary | ICD-10-CM | POA: Diagnosis not present

## 2023-11-16 DIAGNOSIS — G894 Chronic pain syndrome: Secondary | ICD-10-CM | POA: Diagnosis not present

## 2023-11-16 DIAGNOSIS — J849 Interstitial pulmonary disease, unspecified: Secondary | ICD-10-CM | POA: Diagnosis not present

## 2023-11-16 DIAGNOSIS — F411 Generalized anxiety disorder: Secondary | ICD-10-CM | POA: Diagnosis not present

## 2023-11-16 DIAGNOSIS — K219 Gastro-esophageal reflux disease without esophagitis: Secondary | ICD-10-CM | POA: Diagnosis not present

## 2023-11-17 ENCOUNTER — Other Ambulatory Visit: Payer: Self-pay | Admitting: *Deleted

## 2023-11-17 DIAGNOSIS — M7989 Other specified soft tissue disorders: Secondary | ICD-10-CM

## 2023-11-18 DIAGNOSIS — I509 Heart failure, unspecified: Secondary | ICD-10-CM | POA: Diagnosis not present

## 2023-11-18 DIAGNOSIS — E782 Mixed hyperlipidemia: Secondary | ICD-10-CM | POA: Diagnosis not present

## 2023-11-18 DIAGNOSIS — K219 Gastro-esophageal reflux disease without esophagitis: Secondary | ICD-10-CM | POA: Diagnosis not present

## 2023-11-29 ENCOUNTER — Encounter: Payer: Self-pay | Admitting: Internal Medicine

## 2023-11-29 ENCOUNTER — Ambulatory Visit: Attending: Internal Medicine | Admitting: Internal Medicine

## 2023-11-29 VITALS — BP 108/70 | Ht 67.5 in | Wt 125.0 lb

## 2023-11-29 DIAGNOSIS — I4819 Other persistent atrial fibrillation: Secondary | ICD-10-CM | POA: Diagnosis not present

## 2023-11-29 DIAGNOSIS — Z950 Presence of cardiac pacemaker: Secondary | ICD-10-CM | POA: Insufficient documentation

## 2023-11-29 NOTE — Progress Notes (Signed)
 Cardiology Office Note  Date: 11/29/2023   ID: Cindy [name], DOB 02-20-1933, MRN [id number]  PCP:  Shona Norleen PEDLAR, MD  Cardiologist:  Diannah SHAUNNA Maywood, MD Electrophysiologist:  None   History of Present Illness: Cindy [name] is a 89 y.o. female known to have AL amyloidosis (she received NEOD001 as part of the VITAL trial with subsequent isatuximab in clinical trial after recurrence of AL amyloidosis, now in remission), amyloid cardiomyopathy/restrictive cardiomyopathy/chronic diastolic heart failure, persistent A-fib s/p ablation and DCCV in 2016, AV block s/p [employer] dual-chamber PPM in 2018, pulmonary embolism, RV enlargement, questionable interstitial fibrosis is here for follow-up visit.  Patient was volume overloaded when she had ER visit in May 2025 but diuretics dose was not increased at that time.  When she saw me as a new patient visit in May 2025, I did not increase her diuretic dose, instead I started her on Farxiga  10 mg once daily.  She reported improvement in her shortness of breath after adding Farxiga .  She says she is not back to baseline but she feels better and doing her daily activities.  Does not have any angina, dizziness, syncope.  She has upcoming appointment with vascular surgery for the management of chronic venous insufficiency related leg swelling.  She takes injections in her eye for macular degeneration.  She sees pulm at [facility].  She follows up with oncology for the management of AL amyloidosis at [facility] (she said his oncologist switched from [facility] to [facility]).  Past Medical History:  Diagnosis Date   Amyloidosis (HCC)    cancer   Cancer (HCC)    CHF (congestive heart failure) (HCC)    Amyloidosis   Diarrhea    Diverticulosis of colon (without mention of hemorrhage)    Edema    Esophagitis, unspecified    Family history of malignant neoplasm of gastrointestinal tract    Fibrosis of lung (HCC) 03/14/2013   Flatulence, eructation, and gas pain     GERD (gastroesophageal reflux disease)    Left sided ulcerative colitis (HCC)    Macular degeneration    left eye   Maxillary sinus mass    Osteoporosis, unspecified    Other and unspecified hyperlipidemia    Other and unspecified noninfectious gastroenteritis and colitis(558.9)    Paroxysmal atrial fibrillation (HCC) 07/09/2019   Pneumonia    Stricture and stenosis of esophagus    Unspecified hypothyroidism    URI (upper respiratory infection)     Past Surgical History:  Procedure Laterality Date   ABDOMINAL HYSTERECTOMY     CATARACT EXTRACTION W/PHACO Bilateral    Dr. Roz   CHOLECYSTECTOMY     THYROIDECTOMY, PARTIAL     YAG LASER APPLICATION Right 12/12/2013   Procedure: YAG LASER APPLICATION;  Surgeon: Oneil T. Roz, MD;  Location: [facility];  Service: Ophthalmology;  Laterality: Right;    Current Outpatient Medications  Medication Sig Dispense Refill   acetaminophen  (TYLENOL ) 650 MG CR tablet Take 650 mg by mouth every 8 (eight) hours as needed for pain or fever.     albuterol  (PROVENTIL ) (2.5 MG/3ML) 0.083% nebulizer solution Take 3 mLs (2.5 mg total) by nebulization every 6 (six) hours as needed for wheezing or shortness of breath. 2-3 times a week 75 mL 0   ALPRAZolam  (XANAX ) 0.5 MG tablet Take 1 tablet (0.5 mg total) by mouth at bedtime as needed for anxiety. (Patient taking differently: Take 0.5 mg by mouth at bedtime.) 30 tablet 3   apixaban  (ELIQUIS )  2.5 MG TABS tablet Take 1 tablet (2.5 mg total) by mouth 2 (two) times daily. 180 tablet 1   BIOTIN PO Take 1 tablet by mouth daily.     Cholecalciferol  50 MCG (2000 UT) CAPS Take 1 capsule by mouth daily.     clobetasol (TEMOVATE) 0.05 % external solution Apply 1 Application topically 2 (two) times daily.     cyanocobalamin (VITAMIN B12) 1000 MCG tablet Take 1,000 mcg by mouth daily.     dapagliflozin  propanediol (FARXIGA ) 10 MG TABS tablet Take 1 tablet (10 mg total) by mouth daily. 30 tablet 5   fentaNYL   (DURAGESIC ) 25 MCG/HR Place 1 patch onto the skin every 3 (three) days.     Fluocinolone Acetonide 0.01 % OIL Place 5 drops into both ears as needed (itching). As needed for itching     levothyroxine  (SYNTHROID ) 137 MCG tablet Take 137 mcg by mouth daily.     metoprolol  succinate (TOPROL -XL) 25 MG 24 hr tablet Take 0.5-1 tablets (12.5-25 mg total) by mouth in the morning and at bedtime. Patient takes 1 tablet (25mg ) in the morning and 1/2 of a tablet (12.5mg ) in the evening (Patient taking differently: Take 12.5 mg by mouth daily.)     Multiple Vitamins-Minerals (PRESERVISION AREDS PO) Take 1 capsule by mouth daily.     NYAMYC powder Apply 1 Application topically 2 (two) times daily.     ondansetron  (ZOFRAN ) 4 MG tablet Take 4 mg by mouth every 8 (eight) hours as needed for nausea or vomiting.     Polyethyl Glycol-Propyl Glycol (SYSTANE OP) Apply 1 drop to eye daily.     potassium chloride  (KLOR-CON  M) 10 MEQ tablet Take 10 mEq by mouth daily.     Probiotic Product (ALIGN) 4 MG CAPS Take 1 tablet by mouth every morning.     senna (SENOKOT) 8.6 MG tablet Take 2 tablets by mouth as needed for constipation.     sodium chloride  (OCEAN) 0.65 % nasal spray Place 2 sprays into the nose daily as needed for congestion.     torsemide  (DEMADEX ) 20 MG tablet Take 1 tablet (20 mg total) by mouth daily.     No current facility-administered medications for this visit.   Allergies:  Nebivolol hcl, Codeine, Sulfa antibiotics, and Sulfonamide derivatives   Social History: The patient  reports that she has never smoked. She has never been exposed to tobacco smoke. She has never used smokeless tobacco. She reports that she does not drink alcohol  and does not use drugs.   Family History: The patient's family history includes Adrenal disorder in her son; Colon cancer in her mother; Diabetes in her son; Heart attack in her father; Heart disease in her brother; Kidney cancer in her son; Seizures in her mother; Stomach  cancer in her sister.   ROS:  Please see the history of present illness. Otherwise, complete review of systems is positive for none  All other systems are reviewed and negative.   Physical Exam: VS:  BP 108/70 (BP Location: Left Arm, Cuff Size: Normal)   Ht 5' 7.5 (1.715 m)   Wt 125 lb (56.7 kg)   SpO2 91% Comment: on 2 liters oxygen   BMI 19.29 kg/m , BMI Body mass index is 19.29 kg/m.  Wt Readings from Last 3 Encounters:  11/29/23 125 lb (56.7 kg)  09/29/23 126 lb 6.4 oz (57.3 kg)  09/08/23 128 lb 3.2 oz (58.2 kg)    General: Patient appears comfortable at rest. HEENT: Conjunctiva and lids normal,  oropharynx clear with moist mucosa. Neck: Supple, no elevated JVP or carotid bruits, no thyromegaly. Lungs: Clear to auscultation, nonlabored breathing at rest. Cardiac: Regular rate and rhythm, no S3 or significant systolic murmur, no pericardial rub. Abdomen: Soft, nontender, no hepatomegaly, bowel sounds present, no guarding or rebound. Extremities: No pitting edema, distal pulses 2+. Skin: Warm and dry. Musculoskeletal: No kyphosis. Neuropsychiatric: Alert and oriented x3, affect grossly appropriate.  Recent Labwork: 09/02/2023: ALT 11; AST 23; B Natriuretic Peptide 231.0; Hemoglobin 13.9; Platelets 191 09/15/2023: BUN 20; Creatinine, Ser 0.79; Potassium 4.7; Sodium 140     Component Value Date/Time   CHOL 201 (H) 04/06/2014 0912   TRIG 73.0 04/06/2014 0912   HDL 39.30 04/06/2014 0912   CHOLHDL 5 04/06/2014 0912   VLDL 14.6 04/06/2014 0912   LDLCALC 147 (H) 04/06/2014 0912    Assessment and Plan:  Chronic diastolic heart failure: Patient has AL lordosis and restrictive cardiomyopathy.  After adding Farxiga  10 mg once daily, SOB significantly improved.  No weight gain.  Continue torsemide  30 mg once daily, Farxiga  10 mg once daily.  Continue metoprolol  succinate 12.5 mg once daily. Echocardiogram performed in May 2024 showed normal LVEF, RV enlargement with decreased RV  systolic function, moderate pulmonary hypertension and no valvular heart disease.   AL amyloidosis: Follows with oncology at [facility].  Persistent atrial fibrillation s/p DCCV and ablation in 2016: EKG shows atrial paced rhythm, continue metoprolol  succinate 12.5 mg once daily and Eliquis  2.5 mg twice daily.  AV block s/p [employer] dual chamber PPM in 2019: Remote device interrogation reports from [place].  Patient states she is not following up with [facility] cardiology anymore.  Will refer her to electrophysiology at [facility] to establish care for device checks.  Chronic hypoxic respiratory failure: On Home oxygen    Records from Care everywhere reviewed.,  More than 3 labs reviewed.   Medication Adjustments/Labs and Tests Ordered: Current medicines are reviewed at length with the patient today.  Concerns regarding medicines are outlined above.    Disposition:  Follow up 3 months  Signed Anthone Prieur Priya Latandra Loureiro, MD, 11/29/2023 1:03 PM    [facility] at [facility] [address] [place], [place], [place] [address]

## 2023-11-29 NOTE — Patient Instructions (Addendum)
 Medication Instructions:  Your physician recommends that you continue on your current medications as directed. Please refer to the Current Medication list given to you today.   Labwork: None  Testing/Procedures: None  Follow-Up: Your physician recommends that you schedule a follow-up appointment in: 3 months  Any Other Special Instructions Will Be Listed Below (If Applicable).  Referral for Electrophysiology   Thank you for choosing [facility]!     If you need a refill on your cardiac medications before your next appointment, please call your pharmacy.

## 2023-11-30 DIAGNOSIS — H43812 Vitreous degeneration, left eye: Secondary | ICD-10-CM | POA: Diagnosis not present

## 2023-11-30 DIAGNOSIS — H35352 Cystoid macular degeneration, left eye: Secondary | ICD-10-CM | POA: Diagnosis not present

## 2023-11-30 DIAGNOSIS — H353221 Exudative age-related macular degeneration, left eye, with active choroidal neovascularization: Secondary | ICD-10-CM | POA: Diagnosis not present

## 2023-11-30 DIAGNOSIS — H353114 Nonexudative age-related macular degeneration, right eye, advanced atrophic with subfoveal involvement: Secondary | ICD-10-CM | POA: Diagnosis not present

## 2023-11-30 DIAGNOSIS — H3321 Serous retinal detachment, right eye: Secondary | ICD-10-CM | POA: Diagnosis not present

## 2023-11-30 DIAGNOSIS — H353211 Exudative age-related macular degeneration, right eye, with active choroidal neovascularization: Secondary | ICD-10-CM | POA: Diagnosis not present

## 2023-11-30 DIAGNOSIS — H353123 Nonexudative age-related macular degeneration, left eye, advanced atrophic without subfoveal involvement: Secondary | ICD-10-CM | POA: Diagnosis not present

## 2023-12-09 NOTE — Progress Notes (Unsigned)
 Patient ID: Cindy [name], female   DOB: 03-13-1933, 89 y.o.   MRN: [id number]  Reason for Consult: No chief complaint on file.   Referred by Shona Norleen PEDLAR, MD  Subjective:     HPI  Cindy [name] is a 89 y.o. female who presents for evaluation of lower extremity swelling Timeframe: Many years Symptoms: Aching and pain/fatigue Varicosities: yes, posterior R calf Previous wounds: no Previous DVT: no In compression: no  Past Medical History:  Diagnosis Date   Amyloidosis (HCC)    cancer   Cancer (HCC)    CHF (congestive heart failure) (HCC)    Amyloidosis   Diarrhea    Diverticulosis of colon (without mention of hemorrhage)    Edema    Esophagitis, unspecified    Family history of malignant neoplasm of gastrointestinal tract    Fibrosis of lung (HCC) 03/14/2013   Flatulence, eructation, and gas pain    GERD (gastroesophageal reflux disease)    Left sided ulcerative colitis (HCC)    Macular degeneration    left eye   Maxillary sinus mass    Osteoporosis, unspecified    Other and unspecified hyperlipidemia    Other and unspecified noninfectious gastroenteritis and colitis(558.9)    Paroxysmal atrial fibrillation (HCC) 07/09/2019   Pneumonia    Stricture and stenosis of esophagus    Unspecified hypothyroidism    URI (upper respiratory infection)    Family History  Problem Relation Age of Onset   Colon cancer Mother    Seizures Mother    Heart attack Father        CHF   Stomach cancer Sister    Heart disease Brother    Diabetes Son    Adrenal disorder Son    Kidney cancer Son    Past Surgical History:  Procedure Laterality Date   ABDOMINAL HYSTERECTOMY     CATARACT EXTRACTION W/PHACO Bilateral    Dr. Roz   CHOLECYSTECTOMY     THYROIDECTOMY, PARTIAL     YAG LASER APPLICATION Right 12/12/2013   Procedure: YAG LASER APPLICATION;  Surgeon: Oneil T. Roz, MD;  Location: [facility];  Service: Ophthalmology;  Laterality: Right;    Short Social  History:  Social History   Tobacco Use   Smoking status: Never    Passive exposure: Never   Smokeless tobacco: Never  Substance Use Topics   Alcohol  use: No    Alcohol /week: 0.0 standard drinks of alcohol     Allergies  Allergen Reactions   Nebivolol Hcl Shortness Of Breath, Other (See Comments) and Cough    Chest tightness, severe tiredness, confusion   Codeine Nausea Only   Sulfa Antibiotics Nausea Only   Sulfonamide Derivatives Nausea Only    Current Outpatient Medications  Medication Sig Dispense Refill   acetaminophen  (TYLENOL ) 650 MG CR tablet Take 650 mg by mouth every 8 (eight) hours as needed for pain or fever.     albuterol  (PROVENTIL ) (2.5 MG/3ML) 0.083% nebulizer solution Take 3 mLs (2.5 mg total) by nebulization every 6 (six) hours as needed for wheezing or shortness of breath. 2-3 times a week 75 mL 0   ALPRAZolam  (XANAX ) 0.5 MG tablet Take 1 tablet (0.5 mg total) by mouth at bedtime as needed for anxiety. (Patient taking differently: Take 0.5 mg by mouth at bedtime.) 30 tablet 3   apixaban  (ELIQUIS ) 2.5 MG TABS tablet Take 1 tablet (2.5 mg total) by mouth 2 (two) times daily. 180 tablet 1   BIOTIN PO Take 1 tablet by  mouth daily.     Cholecalciferol  50 MCG (2000 UT) CAPS Take 1 capsule by mouth daily.     clobetasol (TEMOVATE) 0.05 % external solution Apply 1 Application topically 2 (two) times daily.     cyanocobalamin (VITAMIN B12) 1000 MCG tablet Take 1,000 mcg by mouth daily.     dapagliflozin  propanediol (FARXIGA ) 10 MG TABS tablet Take 1 tablet (10 mg total) by mouth daily. 30 tablet 5   fentaNYL  (DURAGESIC ) 25 MCG/HR Place 1 patch onto the skin every 3 (three) days.     Fluocinolone Acetonide 0.01 % OIL Place 5 drops into both ears as needed (itching). As needed for itching     levothyroxine  (SYNTHROID ) 137 MCG tablet Take 137 mcg by mouth daily.     metoprolol  succinate (TOPROL -XL) 25 MG 24 hr tablet Take 0.5-1 tablets (12.5-25 mg total) by mouth in the  morning and at bedtime. Patient takes 1 tablet (25mg ) in the morning and 1/2 of a tablet (12.5mg ) in the evening (Patient taking differently: Take 12.5 mg by mouth daily.)     Multiple Vitamins-Minerals (PRESERVISION AREDS PO) Take 1 capsule by mouth daily.     NYAMYC powder Apply 1 Application topically 2 (two) times daily.     ondansetron  (ZOFRAN ) 4 MG tablet Take 4 mg by mouth every 8 (eight) hours as needed for nausea or vomiting.     Polyethyl Glycol-Propyl Glycol (SYSTANE OP) Apply 1 drop to eye daily.     potassium chloride  (KLOR-CON  M) 10 MEQ tablet Take 10 mEq by mouth daily.     Probiotic Product (ALIGN) 4 MG CAPS Take 1 tablet by mouth every morning.     senna (SENOKOT) 8.6 MG tablet Take 2 tablets by mouth as needed for constipation.     sodium chloride  (OCEAN) 0.65 % nasal spray Place 2 sprays into the nose daily as needed for congestion.     torsemide  (DEMADEX ) 20 MG tablet Take 1 tablet (20 mg total) by mouth daily.     No current facility-administered medications for this visit.    REVIEW OF SYSTEMS All other systems were reviewed and are negative    Objective:  Objective   There were no vitals filed for this visit. There is no height or weight on file to calculate BMI.  Physical Exam General: no acute distress Cardiac: hemodynamically stable Pulm: normal work of breathing Neuro: alert, no focal deficit Extremities: 2+ pretibial edema from ankles to knees.  Ropey varicosity in posterior right calf no signs of previous wounds   Data: Reflux study Venous Reflux Times  +--------------+---------+------+-----------+------------+--------+  RIGHT        Reflux NoRefluxReflux TimeDiameter cmsComments                          Yes                                   +--------------+---------+------+-----------+------------+--------+  CFV          no                                               +--------------+---------+------+-----------+------------+--------+  FV prox       no                                              +--------------+---------+------+-----------+------------+--------+  FV mid        no                                              +--------------+---------+------+-----------+------------+--------+  FV dist       no                                              +--------------+---------+------+-----------+------------+--------+  Popliteal    no                                              +--------------+---------+------+-----------+------------+--------+  GSV at SFJ              yes    >500 ms      0.51              +--------------+---------+------+-----------+------------+--------+  GSV prox thigh          yes    >500 ms      0.69              +--------------+---------+------+-----------+------------+--------+  GSV mid thigh           yes    >500 ms      0.46              +--------------+---------+------+-----------+------------+--------+  GSV dist thigh          yes    >500 ms      0.91              +--------------+---------+------+-----------+------------+--------+  GSV at knee             yes    >500 ms      0.36              +--------------+---------+------+-----------+------------+--------+  GSV prox calf           yes    >500 ms      0.37              +--------------+---------+------+-----------+------------+--------+  GSV mid calf            yes    >500 ms      0.17              +--------------+---------+------+-----------+------------+--------+  GSV dist calf no                            0.18              +--------------+---------+------+-----------+------------+--------+  SSV at [facility]    no                            0.41              +--------------+---------+------+-----------+------------+--------+  SSV prox calf no                            0.17               +--------------+---------+------+-----------+------------+--------+  SSV mid calf  no  0.12              +--------------+---------+------+-----------+------------+--------+    Summary:  Right:  - No evidence of deep vein thrombosis seen in the right lower extremity,  from the common femoral through the popliteal veins.  - Venous reflux is noted in the right greater saphenous vein in the thigh.  - Venous reflux is noted in the right greater saphenous vein in the calf.  - There is evidence of chronic thrombus in the prox calf ssv. Multiple  varicosities are seen in the mid medial calf with reflux.     Reflux study on the left from 2021 reviewed Positive for GSV reflux      Assessment/Plan:     Cindy [name] is a 89 y.o. female with chronic venous insufficiency with C3 disease and reflux noted in bilateral GSV I explained the foundation of CVI treatment of compression and elevation I recommended medical grade graduated compression stockings and intermittent leg elevation  Being 89 years old with amyloidosis and heart failure contributing to her swelling I explained that I do not think procedural intervention is in her best interest and that we should just plan for conservative management with compression and elevation.  She explained that she lives alone and has significant trouble getting stockings on.  I explained there are alternatives at medical supply stores and instructed her to look for CircAid wraps which are much easier to get on as they are Velcro straps that can give her some compression.    Follow-up as needed     Norman GORMAN Serve MD [facility]

## 2023-12-10 ENCOUNTER — Encounter: Payer: Self-pay | Admitting: Vascular Surgery

## 2023-12-10 ENCOUNTER — Ambulatory Visit (INDEPENDENT_AMBULATORY_CARE_PROVIDER_SITE_OTHER): Attending: Vascular Surgery | Admitting: Vascular Surgery

## 2023-12-10 ENCOUNTER — Ambulatory Visit (HOSPITAL_COMMUNITY)
Admission: RE | Admit: 2023-12-10 | Discharge: 2023-12-10 | Disposition: A | Discharge status: Home / Self Care | Source: Ambulatory Visit | Attending: Vascular Surgery | Admitting: Vascular Surgery

## 2023-12-10 VITALS — BP 127/67 | HR 75 | Temp 97.2°F | Resp 20 | Ht 67.5 in | Wt 121.0 lb

## 2023-12-10 DIAGNOSIS — I872 Venous insufficiency (chronic) (peripheral): Secondary | ICD-10-CM | POA: Diagnosis not present

## 2023-12-10 DIAGNOSIS — M7989 Other specified soft tissue disorders: Secondary | ICD-10-CM | POA: Insufficient documentation

## 2023-12-13 ENCOUNTER — Telehealth: Payer: Self-pay

## 2023-12-13 NOTE — Telephone Encounter (Signed)
 Christina from [facility] called to get a prescription for Circaid or Juxtalite Circaid compression sleeves.  Called back 12/13/23 @ 1106 and left message to call [facility] back.

## 2023-12-14 DIAGNOSIS — I509 Heart failure, unspecified: Secondary | ICD-10-CM | POA: Diagnosis not present

## 2023-12-14 DIAGNOSIS — G894 Chronic pain syndrome: Secondary | ICD-10-CM | POA: Diagnosis not present

## 2023-12-14 DIAGNOSIS — J849 Interstitial pulmonary disease, unspecified: Secondary | ICD-10-CM | POA: Diagnosis not present

## 2023-12-14 DIAGNOSIS — E039 Hypothyroidism, unspecified: Secondary | ICD-10-CM | POA: Diagnosis not present

## 2023-12-14 DIAGNOSIS — F411 Generalized anxiety disorder: Secondary | ICD-10-CM | POA: Diagnosis not present

## 2023-12-14 DIAGNOSIS — K5909 Other constipation: Secondary | ICD-10-CM | POA: Diagnosis not present

## 2023-12-14 DIAGNOSIS — D8989 Other specified disorders involving the immune mechanism, not elsewhere classified: Secondary | ICD-10-CM | POA: Diagnosis not present

## 2023-12-14 DIAGNOSIS — I272 Pulmonary hypertension, unspecified: Secondary | ICD-10-CM | POA: Diagnosis not present

## 2023-12-14 DIAGNOSIS — R0982 Postnasal drip: Secondary | ICD-10-CM | POA: Diagnosis not present

## 2023-12-14 DIAGNOSIS — K219 Gastro-esophageal reflux disease without esophagitis: Secondary | ICD-10-CM | POA: Diagnosis not present

## 2023-12-14 DIAGNOSIS — I4891 Unspecified atrial fibrillation: Secondary | ICD-10-CM | POA: Diagnosis not present

## 2023-12-14 DIAGNOSIS — I2699 Other pulmonary embolism without acute cor pulmonale: Secondary | ICD-10-CM | POA: Diagnosis not present

## 2023-12-17 DIAGNOSIS — I272 Pulmonary hypertension, unspecified: Secondary | ICD-10-CM | POA: Diagnosis not present

## 2023-12-17 DIAGNOSIS — I4891 Unspecified atrial fibrillation: Secondary | ICD-10-CM | POA: Diagnosis not present

## 2023-12-17 DIAGNOSIS — I509 Heart failure, unspecified: Secondary | ICD-10-CM | POA: Diagnosis not present

## 2023-12-17 DIAGNOSIS — E782 Mixed hyperlipidemia: Secondary | ICD-10-CM | POA: Diagnosis not present

## 2023-12-22 ENCOUNTER — Ambulatory Visit: Admitting: Cardiovascular Disease

## 2023-12-22 DIAGNOSIS — I43 Cardiomyopathy in diseases classified elsewhere: Secondary | ICD-10-CM | POA: Diagnosis not present

## 2023-12-22 DIAGNOSIS — I440 Atrioventricular block, first degree: Secondary | ICD-10-CM | POA: Diagnosis not present

## 2023-12-22 DIAGNOSIS — E854 Organ-limited amyloidosis: Secondary | ICD-10-CM | POA: Diagnosis not present

## 2023-12-22 DIAGNOSIS — Z7901 Long term (current) use of anticoagulants: Secondary | ICD-10-CM | POA: Diagnosis not present

## 2023-12-22 DIAGNOSIS — Z45018 Encounter for adjustment and management of other part of cardiac pacemaker: Secondary | ICD-10-CM | POA: Diagnosis not present

## 2023-12-22 DIAGNOSIS — Z950 Presence of cardiac pacemaker: Secondary | ICD-10-CM | POA: Diagnosis not present

## 2023-12-22 DIAGNOSIS — I5030 Unspecified diastolic (congestive) heart failure: Secondary | ICD-10-CM | POA: Diagnosis not present

## 2023-12-22 DIAGNOSIS — I480 Paroxysmal atrial fibrillation: Secondary | ICD-10-CM | POA: Diagnosis not present

## 2023-12-22 DIAGNOSIS — Z9889 Other specified postprocedural states: Secondary | ICD-10-CM | POA: Diagnosis not present

## 2023-12-29 DIAGNOSIS — H43812 Vitreous degeneration, left eye: Secondary | ICD-10-CM | POA: Diagnosis not present

## 2023-12-29 DIAGNOSIS — H353123 Nonexudative age-related macular degeneration, left eye, advanced atrophic without subfoveal involvement: Secondary | ICD-10-CM | POA: Diagnosis not present

## 2023-12-29 DIAGNOSIS — H3321 Serous retinal detachment, right eye: Secondary | ICD-10-CM | POA: Diagnosis not present

## 2023-12-29 DIAGNOSIS — H353211 Exudative age-related macular degeneration, right eye, with active choroidal neovascularization: Secondary | ICD-10-CM | POA: Diagnosis not present

## 2023-12-29 DIAGNOSIS — H353114 Nonexudative age-related macular degeneration, right eye, advanced atrophic with subfoveal involvement: Secondary | ICD-10-CM | POA: Diagnosis not present

## 2023-12-29 DIAGNOSIS — H353221 Exudative age-related macular degeneration, left eye, with active choroidal neovascularization: Secondary | ICD-10-CM | POA: Diagnosis not present

## 2023-12-29 DIAGNOSIS — H35352 Cystoid macular degeneration, left eye: Secondary | ICD-10-CM | POA: Diagnosis not present

## 2023-12-30 ENCOUNTER — Telehealth: Payer: Self-pay | Admitting: Pulmonary Disease

## 2023-12-30 NOTE — Telephone Encounter (Signed)
 Copied from CRM #[id number]. Topic: Appointments - Transfer of Care >> Dec 30, 2023 11:48 AM Corean SAUNDERS wrote: Pt is requesting to transfer FROM: Dr. Jude at [facility]  Pt is requesting to transfer TO: Dr. Catherine Reason for requested transfer: Dr. Jude changed location, too far away for patient.  It is the responsibility of the team the patient would like to transfer to (Dr. Catherine) to reach out to the patient if for any reason this transfer is not acceptable.

## 2024-01-06 DIAGNOSIS — J069 Acute upper respiratory infection, unspecified: Secondary | ICD-10-CM | POA: Diagnosis not present

## 2024-01-11 DIAGNOSIS — K5909 Other constipation: Secondary | ICD-10-CM | POA: Diagnosis not present

## 2024-01-11 DIAGNOSIS — I2699 Other pulmonary embolism without acute cor pulmonale: Secondary | ICD-10-CM | POA: Diagnosis not present

## 2024-01-11 DIAGNOSIS — R0982 Postnasal drip: Secondary | ICD-10-CM | POA: Diagnosis not present

## 2024-01-11 DIAGNOSIS — K219 Gastro-esophageal reflux disease without esophagitis: Secondary | ICD-10-CM | POA: Diagnosis not present

## 2024-01-11 DIAGNOSIS — D8989 Other specified disorders involving the immune mechanism, not elsewhere classified: Secondary | ICD-10-CM | POA: Diagnosis not present

## 2024-01-11 DIAGNOSIS — I4891 Unspecified atrial fibrillation: Secondary | ICD-10-CM | POA: Diagnosis not present

## 2024-01-11 DIAGNOSIS — I509 Heart failure, unspecified: Secondary | ICD-10-CM | POA: Diagnosis not present

## 2024-01-11 DIAGNOSIS — G894 Chronic pain syndrome: Secondary | ICD-10-CM | POA: Diagnosis not present

## 2024-01-11 DIAGNOSIS — I272 Pulmonary hypertension, unspecified: Secondary | ICD-10-CM | POA: Diagnosis not present

## 2024-01-11 DIAGNOSIS — F411 Generalized anxiety disorder: Secondary | ICD-10-CM | POA: Diagnosis not present

## 2024-01-11 DIAGNOSIS — E039 Hypothyroidism, unspecified: Secondary | ICD-10-CM | POA: Diagnosis not present

## 2024-01-11 DIAGNOSIS — J849 Interstitial pulmonary disease, unspecified: Secondary | ICD-10-CM | POA: Diagnosis not present

## 2024-01-13 DIAGNOSIS — E039 Hypothyroidism, unspecified: Secondary | ICD-10-CM | POA: Diagnosis not present

## 2024-01-13 DIAGNOSIS — I509 Heart failure, unspecified: Secondary | ICD-10-CM | POA: Diagnosis not present

## 2024-01-13 DIAGNOSIS — E782 Mixed hyperlipidemia: Secondary | ICD-10-CM | POA: Diagnosis not present

## 2024-01-13 DIAGNOSIS — R7303 Prediabetes: Secondary | ICD-10-CM | POA: Diagnosis not present

## 2024-01-18 DIAGNOSIS — H353211 Exudative age-related macular degeneration, right eye, with active choroidal neovascularization: Secondary | ICD-10-CM | POA: Diagnosis not present

## 2024-01-18 DIAGNOSIS — H3321 Serous retinal detachment, right eye: Secondary | ICD-10-CM | POA: Diagnosis not present

## 2024-01-18 DIAGNOSIS — E782 Mixed hyperlipidemia: Secondary | ICD-10-CM | POA: Diagnosis not present

## 2024-01-18 DIAGNOSIS — I509 Heart failure, unspecified: Secondary | ICD-10-CM | POA: Diagnosis not present

## 2024-01-18 DIAGNOSIS — E039 Hypothyroidism, unspecified: Secondary | ICD-10-CM | POA: Diagnosis not present

## 2024-01-18 DIAGNOSIS — H353221 Exudative age-related macular degeneration, left eye, with active choroidal neovascularization: Secondary | ICD-10-CM | POA: Diagnosis not present

## 2024-01-18 DIAGNOSIS — H43812 Vitreous degeneration, left eye: Secondary | ICD-10-CM | POA: Diagnosis not present

## 2024-01-18 DIAGNOSIS — H353114 Nonexudative age-related macular degeneration, right eye, advanced atrophic with subfoveal involvement: Secondary | ICD-10-CM | POA: Diagnosis not present

## 2024-01-18 DIAGNOSIS — H353123 Nonexudative age-related macular degeneration, left eye, advanced atrophic without subfoveal involvement: Secondary | ICD-10-CM | POA: Diagnosis not present

## 2024-01-18 DIAGNOSIS — H35352 Cystoid macular degeneration, left eye: Secondary | ICD-10-CM | POA: Diagnosis not present

## 2024-01-19 ENCOUNTER — Encounter: Payer: Self-pay | Admitting: Family Medicine

## 2024-01-19 DIAGNOSIS — G894 Chronic pain syndrome: Secondary | ICD-10-CM | POA: Diagnosis not present

## 2024-01-19 DIAGNOSIS — I509 Heart failure, unspecified: Secondary | ICD-10-CM | POA: Diagnosis not present

## 2024-01-19 DIAGNOSIS — J849 Interstitial pulmonary disease, unspecified: Secondary | ICD-10-CM | POA: Diagnosis not present

## 2024-01-19 DIAGNOSIS — Z23 Encounter for immunization: Secondary | ICD-10-CM | POA: Diagnosis not present

## 2024-01-19 DIAGNOSIS — R600 Localized edema: Secondary | ICD-10-CM | POA: Diagnosis not present

## 2024-01-19 DIAGNOSIS — I272 Pulmonary hypertension, unspecified: Secondary | ICD-10-CM | POA: Diagnosis not present

## 2024-01-19 DIAGNOSIS — R5383 Other fatigue: Secondary | ICD-10-CM | POA: Diagnosis not present

## 2024-01-19 DIAGNOSIS — Z86711 Personal history of pulmonary embolism: Secondary | ICD-10-CM | POA: Diagnosis not present

## 2024-01-19 DIAGNOSIS — E782 Mixed hyperlipidemia: Secondary | ICD-10-CM | POA: Diagnosis not present

## 2024-01-19 DIAGNOSIS — I872 Venous insufficiency (chronic) (peripheral): Secondary | ICD-10-CM | POA: Diagnosis not present

## 2024-01-19 DIAGNOSIS — J9611 Chronic respiratory failure with hypoxia: Secondary | ICD-10-CM | POA: Diagnosis not present

## 2024-01-19 DIAGNOSIS — D808 Other immunodeficiencies with predominantly antibody defects: Secondary | ICD-10-CM | POA: Diagnosis not present

## 2024-01-27 DIAGNOSIS — R002 Palpitations: Secondary | ICD-10-CM | POA: Diagnosis not present

## 2024-01-27 DIAGNOSIS — I484 Atypical atrial flutter: Secondary | ICD-10-CM | POA: Diagnosis not present

## 2024-01-27 DIAGNOSIS — Z950 Presence of cardiac pacemaker: Secondary | ICD-10-CM | POA: Diagnosis not present

## 2024-01-27 DIAGNOSIS — I43 Cardiomyopathy in diseases classified elsewhere: Secondary | ICD-10-CM | POA: Diagnosis not present

## 2024-01-27 DIAGNOSIS — I071 Rheumatic tricuspid insufficiency: Secondary | ICD-10-CM | POA: Diagnosis not present

## 2024-01-27 DIAGNOSIS — I5030 Unspecified diastolic (congestive) heart failure: Secondary | ICD-10-CM | POA: Diagnosis not present

## 2024-01-27 DIAGNOSIS — I491 Atrial premature depolarization: Secondary | ICD-10-CM | POA: Diagnosis not present

## 2024-01-27 DIAGNOSIS — I442 Atrioventricular block, complete: Secondary | ICD-10-CM | POA: Diagnosis not present

## 2024-01-27 DIAGNOSIS — Z45018 Encounter for adjustment and management of other part of cardiac pacemaker: Secondary | ICD-10-CM | POA: Diagnosis not present

## 2024-01-27 DIAGNOSIS — I429 Cardiomyopathy, unspecified: Secondary | ICD-10-CM | POA: Diagnosis not present

## 2024-01-27 DIAGNOSIS — Z7901 Long term (current) use of anticoagulants: Secondary | ICD-10-CM | POA: Diagnosis not present

## 2024-02-02 DIAGNOSIS — K219 Gastro-esophageal reflux disease without esophagitis: Secondary | ICD-10-CM | POA: Diagnosis not present

## 2024-02-02 DIAGNOSIS — K5909 Other constipation: Secondary | ICD-10-CM | POA: Diagnosis not present

## 2024-02-02 DIAGNOSIS — R0982 Postnasal drip: Secondary | ICD-10-CM | POA: Diagnosis not present

## 2024-02-02 DIAGNOSIS — D8989 Other specified disorders involving the immune mechanism, not elsewhere classified: Secondary | ICD-10-CM | POA: Diagnosis not present

## 2024-02-02 DIAGNOSIS — J849 Interstitial pulmonary disease, unspecified: Secondary | ICD-10-CM | POA: Diagnosis not present

## 2024-02-02 DIAGNOSIS — I272 Pulmonary hypertension, unspecified: Secondary | ICD-10-CM | POA: Diagnosis not present

## 2024-02-02 DIAGNOSIS — I2699 Other pulmonary embolism without acute cor pulmonale: Secondary | ICD-10-CM | POA: Diagnosis not present

## 2024-02-02 DIAGNOSIS — F411 Generalized anxiety disorder: Secondary | ICD-10-CM | POA: Diagnosis not present

## 2024-02-02 DIAGNOSIS — G894 Chronic pain syndrome: Secondary | ICD-10-CM | POA: Diagnosis not present

## 2024-02-02 DIAGNOSIS — I509 Heart failure, unspecified: Secondary | ICD-10-CM | POA: Diagnosis not present

## 2024-02-02 DIAGNOSIS — I4891 Unspecified atrial fibrillation: Secondary | ICD-10-CM | POA: Diagnosis not present

## 2024-02-02 DIAGNOSIS — E039 Hypothyroidism, unspecified: Secondary | ICD-10-CM | POA: Diagnosis not present

## 2024-02-08 ENCOUNTER — Other Ambulatory Visit: Payer: Self-pay | Admitting: Internal Medicine

## 2024-02-10 DIAGNOSIS — J069 Acute upper respiratory infection, unspecified: Secondary | ICD-10-CM | POA: Diagnosis not present

## 2024-02-10 DIAGNOSIS — J849 Interstitial pulmonary disease, unspecified: Secondary | ICD-10-CM | POA: Diagnosis not present

## 2024-02-10 DIAGNOSIS — R531 Weakness: Secondary | ICD-10-CM | POA: Diagnosis not present

## 2024-02-15 NOTE — Progress Notes (Deleted)
 Established Patient Pulmonology Office Visit   Subjective:  Patient ID: Cindy [name], female    DOB: 1933/01/07  MRN: [id number]  CC: No chief complaint on file.   HPI  Cindy [name] is an 89 y/o  F with a PMH significant for HFpEF, nonrheumatic MR, ectopic atrial tachycardia, paroxysmal atrial fibrillation, atypical atrial flutter, third-degree AV block s/p [employer] dual-chamber pacemaker, cardiac amyloidosis, ILD, hypothyroidism and group II pulmonary hypertension who presents for follow up.    {PULM QUESTIONNAIRES (Optional):33196}  ROS  {History (Optional):23778}  Current Outpatient Medications:    acetaminophen  (TYLENOL ) 650 MG CR tablet, Take 650 mg by mouth every 8 (eight) hours as needed for pain or fever., Disp: , Rfl:    albuterol  (PROVENTIL ) (2.5 MG/3ML) 0.083% nebulizer solution, Take 3 mLs (2.5 mg total) by nebulization every 6 (six) hours as needed for wheezing or shortness of breath. 2-3 times a week, Disp: 75 mL, Rfl: 0   ALPRAZolam  (XANAX ) 0.5 MG tablet, Take 1 tablet (0.5 mg total) by mouth at bedtime as needed for anxiety. (Patient taking differently: Take 0.5 mg by mouth at bedtime.), Disp: 30 tablet, Rfl: 3   apixaban  (ELIQUIS ) 2.5 MG TABS tablet, Take 1 tablet (2.5 mg total) by mouth 2 (two) times daily., Disp: 180 tablet, Rfl: 1   BIOTIN PO, Take 1 tablet by mouth daily., Disp: , Rfl:    Cholecalciferol  50 MCG (2000 UT) CAPS, Take 1 capsule by mouth daily., Disp: , Rfl:    clobetasol (TEMOVATE) 0.05 % external solution, Apply 1 Application topically 2 (two) times daily., Disp: , Rfl:    cyanocobalamin (VITAMIN B12) 1000 MCG tablet, Take 1,000 mcg by mouth daily., Disp: , Rfl:    FARXIGA  10 MG TABS tablet, TAKE 1 TABLET BY MOUTH DAILY, Disp: 90 tablet, Rfl: 3   fentaNYL  (DURAGESIC ) 25 MCG/HR, Place 1 patch onto the skin every 3 (three) days., Disp: , Rfl:    Fluocinolone Acetonide 0.01 % OIL, Place 5 drops into both ears as needed (itching). As  needed for itching, Disp: , Rfl:    levothyroxine  (SYNTHROID ) 137 MCG tablet, Take 137 mcg by mouth daily., Disp: , Rfl:    metoprolol  succinate (TOPROL -XL) 25 MG 24 hr tablet, Take 0.5-1 tablets (12.5-25 mg total) by mouth in the morning and at bedtime. Patient takes 1 tablet (25mg ) in the morning and 1/2 of a tablet (12.5mg ) in the evening (Patient taking differently: Take 12.5 mg by mouth daily.), Disp: , Rfl:    Multiple Vitamins-Minerals (PRESERVISION AREDS PO), Take 1 capsule by mouth daily., Disp: , Rfl:    NYAMYC powder, Apply 1 Application topically 2 (two) times daily., Disp: , Rfl:    ondansetron  (ZOFRAN ) 4 MG tablet, Take 4 mg by mouth every 8 (eight) hours as needed for nausea or vomiting., Disp: , Rfl:    Polyethyl Glycol-Propyl Glycol (SYSTANE OP), Apply 1 drop to eye daily., Disp: , Rfl:    potassium chloride  (KLOR-CON  M) 10 MEQ tablet, Take 10 mEq by mouth daily., Disp: , Rfl:    Probiotic Product (ALIGN) 4 MG CAPS, Take 1 tablet by mouth every morning., Disp: , Rfl:    senna (SENOKOT) 8.6 MG tablet, Take 2 tablets by mouth as needed for constipation., Disp: , Rfl:    sodium chloride  (OCEAN) 0.65 % nasal spray, Place 2 sprays into the nose daily as needed for congestion., Disp: , Rfl:    torsemide  (DEMADEX ) 20 MG tablet, Take 1 tablet (20 mg total) by  mouth daily., Disp: , Rfl:       Objective:  There were no vitals taken for this visit. {Pulm Vitals (Optional):32837}  Physical Exam   Diagnostic Review:  {Labs (Optional):32838}  CT Chest 10/08/2022: 1. Stable chest CT study. 2. Stable mild cardiomegaly. One vessel coronary atherosclerosis. 3. Stable dilated main pulmonary artery, suggesting chronic pulmonary arterial hypertension. 4. Chronic patchy centrilobular micronodularity and faint patchy ground-glass opacity throughout both lungs, unchanged, differential includes plexogenic pulmonary arteriopathy from chronic pulmonary hypertension with superimposed mild  pulmonary edema, versus chronic hypersensitivity pneumonitis. 5. Stable small loculated basilar right pleural effusion. Stable thick bandlike consolidation in the posterior lungs, right greater than left, favoring bland postinfectious scarring. 6.  Aortic Atherosclerosis (ICD10-I70.0).  TTE 09/17/2022: IMPRESSIONS     1. Left ventricular ejection fraction, by estimation, is 60 to 65%. The  left ventricle has normal function. The left ventricle has no regional  wall motion abnormalities. There is mild left ventricular hypertrophy.  Left ventricular diastolic parameters  are indeterminate.   2. RV poorly visualized. Grossly appears enlarged with decreased systolic  function. . Right ventricular systolic function was not well visualized.  The right ventricular size is not well visualized. There is moderately  elevated pulmonary artery systolic  pressure.   3. The mitral valve is normal in structure. Trivial mitral valve  regurgitation. No evidence of mitral stenosis.   4. The tricuspid valve is abnormal. Tricuspid valve regurgitation is  moderate.   5. The aortic valve is tricuspid. There is mild calcification of the  aortic valve. There is mild thickening of the aortic valve. Aortic valve  regurgitation is not visualized. No aortic stenosis is present.   6. The inferior vena cava is normal in size with greater than 50%  respiratory variability, suggesting right atrial pressure of 3 mmHg.     Assessment & Plan:   Assessment & Plan Chronic respiratory failure with hypoxia (HCC)   No orders of the defined types were placed in this encounter.     No follow-ups on file.   Trevonne Nyland, MD

## 2024-02-16 ENCOUNTER — Encounter: Admitting: Pulmonary Disease

## 2024-02-16 DIAGNOSIS — J9611 Chronic respiratory failure with hypoxia: Secondary | ICD-10-CM

## 2024-02-16 NOTE — Progress Notes (Addendum)
 Established Patient Pulmonology Office Visit   Subjective:  Patient ID: Cindy [name], female    DOB: 1932/08/24  MRN: [id number]  CC:  Chief Complaint  Patient presents with   respiratory failure    Shob    HPI  Cindy [name] is an 89 y/o  F with a PMH significant for HFpEF, nonrheumatic MR, ectopic atrial tachycardia, paroxysmal atrial fibrillation, atypical atrial flutter, third-degree AV block s/p [employer] dual-chamber pacemaker, cardiac amyloidosis, ILD, hypothyroidism and group II pulmonary hypertension who presents for follow up.  Discussed the use of AI scribe software for clinical note transcription with the patient, who gave verbal consent to proceed. History of Present Illness Cindy [name] is an 89 year old female with amyloidosis affecting her heart and lungs who presents with increased fatigue and respiratory symptoms.  She was diagnosed with amyloidosis nine and a half years ago and was told it affected her heart and lungs. Initially given a prognosis of two weeks to live, she has significantly surpassed this expectation. She underwent a year and a half of fluid drainage from her heart and lungs, which improved her condition.  Recently, she has experienced increased fatigue and a significant cough with phlegm production. The phlegm was initially yellow but improved after a course of antibiotics. No fevers, chills, or night sweats. She experiences shortness of breath, especially when walking through her large house, but not when lying flat. She monitors her weight daily to manage fluid retention, maintaining it around 120 pounds. She takes torsemide  20 mg daily for fluid management.  She has a history of swollen ankles, which do not resolve completely, and she has been unable to use compression stockings due to weakness. She is on continuous oxygen  at two liters per minute, which she started after developing blood clots in her chest following her  husband's death. She takes apixaban  2.5 mg for anticoagulation, reduced from 5 mg to minimize bleeding risks. She uses a nebulizer with albuterol  as needed, which helps loosen phlegm, and she has about ten to twelve vials left.  Her social history includes caring for her husband at home until his passing, which contributed to her fatigue. She has a son who has survived multiple cancers and is now cancer-free.  ROS    Current Outpatient Medications:    albuterol  (PROVENTIL ) (2.5 MG/3ML) 0.083% nebulizer solution, Take 3 mLs (2.5 mg total) by nebulization every 6 (six) hours as needed for wheezing or shortness of breath., Disp: 75 mL, Rfl: 5   acetaminophen  (TYLENOL ) 650 MG CR tablet, Take 650 mg by mouth every 8 (eight) hours as needed for pain or fever., Disp: , Rfl:    albuterol  (PROVENTIL ) (2.5 MG/3ML) 0.083% nebulizer solution, Take 3 mLs (2.5 mg total) by nebulization every 6 (six) hours as needed for wheezing or shortness of breath. 2-3 times a week, Disp: 75 mL, Rfl: 0   ALPRAZolam  (XANAX ) 0.5 MG tablet, Take 1 tablet (0.5 mg total) by mouth at bedtime as needed for anxiety. (Patient taking differently: Take 0.5 mg by mouth at bedtime.), Disp: 30 tablet, Rfl: 3   amoxicillin (AMOXIL) 875 MG tablet, Take 875 mg by mouth 2 (two) times daily., Disp: , Rfl:    apixaban  (ELIQUIS ) 2.5 MG TABS tablet, Take 1 tablet (2.5 mg total) by mouth 2 (two) times daily., Disp: 180 tablet, Rfl: 1   BIOTIN PO, Take 1 tablet by mouth daily., Disp: , Rfl:    Cholecalciferol  50 MCG (2000 UT) CAPS, Take 1 capsule  by mouth daily., Disp: , Rfl:    clobetasol (TEMOVATE) 0.05 % external solution, Apply 1 Application topically 2 (two) times daily., Disp: , Rfl:    cyanocobalamin (VITAMIN B12) 1000 MCG tablet, Take 1,000 mcg by mouth daily., Disp: , Rfl:    FARXIGA  10 MG TABS tablet, TAKE 1 TABLET BY MOUTH DAILY, Disp: 90 tablet, Rfl: 3   fentaNYL  (DURAGESIC ) 25 MCG/HR, Place 1 patch onto the skin every 3 (three) days.,  Disp: , Rfl:    Fluocinolone Acetonide 0.01 % OIL, Place 5 drops into both ears as needed (itching). As needed for itching, Disp: , Rfl:    levothyroxine  (SYNTHROID ) 137 MCG tablet, Take 137 mcg by mouth daily., Disp: , Rfl:    metoprolol  succinate (TOPROL -XL) 25 MG 24 hr tablet, Take 0.5-1 tablets (12.5-25 mg total) by mouth in the morning and at bedtime. Patient takes 1 tablet (25mg ) in the morning and 1/2 of a tablet (12.5mg ) in the evening (Patient taking differently: Take 12.5 mg by mouth daily.), Disp: , Rfl:    Multiple Vitamins-Minerals (PRESERVISION AREDS PO), Take 1 capsule by mouth daily., Disp: , Rfl:    NYAMYC powder, Apply 1 Application topically 2 (two) times daily., Disp: , Rfl:    ondansetron  (ZOFRAN ) 4 MG tablet, Take 4 mg by mouth every 8 (eight) hours as needed for nausea or vomiting., Disp: , Rfl:    Polyethyl Glycol-Propyl Glycol (SYSTANE OP), Apply 1 drop to eye daily., Disp: , Rfl:    potassium chloride  (KLOR-CON  M) 10 MEQ tablet, Take 10 mEq by mouth daily., Disp: , Rfl:    Probiotic Product (ALIGN) 4 MG CAPS, Take 1 tablet by mouth every morning., Disp: , Rfl:    senna (SENOKOT) 8.6 MG tablet, Take 2 tablets by mouth as needed for constipation., Disp: , Rfl:    sodium chloride  (OCEAN) 0.65 % nasal spray, Place 2 sprays into the nose daily as needed for congestion., Disp: , Rfl:    torsemide  (DEMADEX ) 20 MG tablet, Take 1 tablet (20 mg total) by mouth daily., Disp: , Rfl:       Objective:  BP 136/83   Pulse 85   Ht 5' 7.5 (1.715 m)   Wt 122 lb (55.3 kg)   SpO2 90% Comment: 2 l poc  BMI 18.83 kg/m  Wt Readings from Last 3 Encounters:  02/17/24 122 lb (55.3 kg)  12/10/23 121 lb (54.9 kg)  11/29/23 125 lb (56.7 kg)   BMI Readings from Last 3 Encounters:  02/17/24 18.83 kg/m  12/10/23 18.67 kg/m  11/29/23 19.29 kg/m   SpO2 Readings from Last 3 Encounters:  02/17/24 90%  12/10/23 99%  11/29/23 91%    Physical Exam General: chronically ill appearing, no  distress Eyes: PERRL, no scleral icterus ENMT: oropharynx clear, good dentition, no oral lesions, mallampati score I Skin: warm, intact, no rashes Neck: JVD elevated, ROM and lymph node assessment normal CV: RRR, no MRG, nl S1 and S2, no RV heave 2+ pitting peripheral edema Resp: clear to auscultation bilaterally, no wheezes, rales, or rhonchi, normal effort, no clubbing/cyanosis Neuro: Awake alert oriented to person place time and situation   Diagnostic Review:  Last CBC Lab Results  Component Value Date   WBC 6.0 09/02/2023   HGB 13.9 09/02/2023   HCT 42.8 09/02/2023   MCV 102.1 (H) 09/02/2023   MCH 33.2 09/02/2023   RDW 13.4 09/02/2023   PLT 191 09/02/2023   Last metabolic panel Lab Results  Component Value Date  GLUCOSE 103 (H) 09/15/2023   NA 140 09/15/2023   K 4.7 09/15/2023   CL 101 09/15/2023   CO2 19 (L) 09/15/2023   BUN 20 09/15/2023   CREATININE 0.79 09/15/2023   EGFR 72 09/15/2023   CALCIUM  9.4 09/15/2023   PHOS 4.4 07/08/2019   PROT 7.4 09/02/2023   ALBUMIN 3.8 09/02/2023   BILITOT 1.6 (H) 09/02/2023   ALKPHOS 105 09/02/2023   AST 23 09/02/2023   ALT 11 09/02/2023   ANIONGAP 10 09/02/2023    CT Chest 10/08/2022: 1. Stable chest CT study. 2. Stable mild cardiomegaly. One vessel coronary atherosclerosis. 3. Stable dilated main pulmonary artery, suggesting chronic pulmonary arterial hypertension. 4. Chronic patchy centrilobular micronodularity and faint patchy ground-glass opacity throughout both lungs, unchanged, differential includes plexogenic pulmonary arteriopathy from chronic pulmonary hypertension with superimposed mild pulmonary edema, versus chronic hypersensitivity pneumonitis. 5. Stable small loculated basilar right pleural effusion. Stable thick bandlike consolidation in the posterior lungs, right greater than left, favoring bland postinfectious scarring. 6.  Aortic Atherosclerosis (ICD10-I70.0).  TTE 09/17/2022: IMPRESSIONS   1. Left  ventricular ejection fraction, by estimation, is 60 to 65%. The  left ventricle has normal function. The left ventricle has no regional  wall motion abnormalities. There is mild left ventricular hypertrophy.  Left ventricular diastolic parameters  are indeterminate.   2. RV poorly visualized. Grossly appears enlarged with decreased systolic  function. Right ventricular systolic function was not well visualized.  The right ventricular size is not well visualized. There is moderately  elevated pulmonary artery systolic  pressure.   3. The mitral valve is normal in structure. Trivial mitral valve  regurgitation. No evidence of mitral stenosis.   4. The tricuspid valve is abnormal. Tricuspid valve regurgitation is  moderate.   5. The aortic valve is tricuspid. There is mild calcification of the  aortic valve. There is mild thickening of the aortic valve. Aortic valve  regurgitation is not visualized. No aortic stenosis is present.   6. The inferior vena cava is normal in size with greater than 50%  respiratory variability, suggesting right atrial pressure of 3 mmHg.   No right heart cath data.    Assessment & Plan:   Assessment & Plan Chronic respiratory failure with hypoxia (HCC) Chronic respiratory failure with hypoxia Managed with continuous oxygen  therapy. Symptoms include fatigue and exertional dyspnea. Increased phlegm improved with antibiotics. Nebulizer use effective. - Prescribe new nebulizer machine. - Prescribe albuterol  for nebulizer. - Refer to pulmonary rehabilitation program. Pulmonary hypertension (HCC) Pulmonary hypertension likely group II due to HFpEF. NYHA Class III/IV - Diuretic plan per below - Monitor weight daily - Increase diuretic regimen as needed - Continue oxygen  therapy to maintain SPO2 > 92% given hx of PH. ILD (interstitial lung disease) (HCC) Likely due to amyloidosis. HRCT reviewed and appears to be stable over time. There's no data to support using  antifibotics in amyloidosis. Also, patient's BMI is low already and thus starting her on medications that may cause significant diarrhea and weight loss is not in her best interest.  Cardiac amyloidosis (HCC) Heart failure with preserved ejection fraction due to amyloidosis Chronic diastolic heart failure secondary to amyloidosis. Managed with torsemide . Reports chronic edema and occasional dyspnea. Weight monitoring ongoing. - Advise doubling torsemide  dose temporarily to manage edema and return weight to baseline of 120 lbs. - Continue daily weight monitoring. - Continue Farxiga . Light chain (AL) amyloidosis (HCC) Amyloidosis with cardiac and pulmonary involvement Chronic condition with heart and lung involvement causing heart  failure and lung disease. Simple chronic bronchitis (HCC)   Orders Placed This Encounter  Procedures   AMB REFERRAL FOR DME   AMB referral to pulmonary rehabilitation   I spent 40 minutes reviewing patient's chart including prior consultant notes, imaging, and PFTs as well as face-to-face with the patient, over half in discussion of the diagnosis and the importance of compliance with the treatment plan.  Return in about 4 months (around 06/17/2024).   Elizet Kaplan, MD

## 2024-02-17 ENCOUNTER — Ambulatory Visit (INDEPENDENT_AMBULATORY_CARE_PROVIDER_SITE_OTHER): Admitting: Pulmonary Disease

## 2024-02-17 ENCOUNTER — Encounter: Payer: Self-pay | Admitting: Pulmonary Disease

## 2024-02-17 VITALS — BP 136/83 | HR 85 | Ht 67.5 in | Wt 122.0 lb

## 2024-02-17 DIAGNOSIS — J849 Interstitial pulmonary disease, unspecified: Secondary | ICD-10-CM

## 2024-02-17 DIAGNOSIS — J9611 Chronic respiratory failure with hypoxia: Secondary | ICD-10-CM | POA: Diagnosis not present

## 2024-02-17 DIAGNOSIS — E8581 Light chain (AL) amyloidosis: Secondary | ICD-10-CM

## 2024-02-17 DIAGNOSIS — I43 Cardiomyopathy in diseases classified elsewhere: Secondary | ICD-10-CM

## 2024-02-17 DIAGNOSIS — E854 Organ-limited amyloidosis: Secondary | ICD-10-CM | POA: Diagnosis not present

## 2024-02-17 DIAGNOSIS — I272 Pulmonary hypertension, unspecified: Secondary | ICD-10-CM | POA: Diagnosis not present

## 2024-02-17 DIAGNOSIS — J410 Simple chronic bronchitis: Secondary | ICD-10-CM

## 2024-02-17 MED ORDER — ALBUTEROL SULFATE (2.5 MG/3ML) 0.083% IN NEBU: 2.5000 mg | INHALATION_SOLUTION | Freq: Four times a day (QID) | RESPIRATORY_TRACT | 5 refills | Status: AC | PRN

## 2024-02-17 MED ORDER — ALBUTEROL SULFATE (2.5 MG/3ML) 0.083% IN NEBU
2.5000 mg | INHALATION_SOLUTION | Freq: Four times a day (QID) | RESPIRATORY_TRACT | 0 refills | Status: AC | PRN
Start: 2024-02-17 — End: ?

## 2024-02-17 NOTE — Assessment & Plan Note (Signed)
 Chronic respiratory failure with hypoxia Managed with continuous oxygen  therapy. Symptoms include fatigue and exertional dyspnea. Increased phlegm improved with antibiotics. Nebulizer use effective. - Prescribe new nebulizer machine. - Prescribe albuterol  for nebulizer. - Refer to pulmonary rehabilitation program.

## 2024-02-17 NOTE — Assessment & Plan Note (Signed)
 Heart failure with preserved ejection fraction due to amyloidosis Chronic diastolic heart failure secondary to amyloidosis. Managed with torsemide . Reports chronic edema and occasional dyspnea. Weight monitoring ongoing. - Advise doubling torsemide  dose temporarily to manage edema and return weight to baseline of 120 lbs. - Continue daily weight monitoring. - Continue Farxiga .

## 2024-02-17 NOTE — Assessment & Plan Note (Signed)
 Amyloidosis with cardiac and pulmonary involvement Chronic condition with heart and lung involvement causing heart failure and lung disease.

## 2024-02-17 NOTE — Patient Instructions (Signed)
 VISIT SUMMARY: Today, we discussed your ongoing management of amyloidosis affecting your heart and lungs, which has led to increased fatigue and respiratory symptoms. We reviewed your current medications and made some adjustments to help manage your symptoms more effectively.  YOUR PLAN: AMYLOIDOSIS WITH CARDIAC AND PULMONARY INVOLVEMENT: This is a chronic condition affecting your heart and lungs, causing heart failure and lung disease. -Monitor for any new symptoms and report them promptly.  CHRONIC RESPIRATORY FAILURE WITH HYPOXIA: This condition causes low oxygen  levels in your blood, leading to fatigue and shortness of breath. -You will receive a new nebulizer machine. -Continue using albuterol  with the nebulizer as needed. -You will be referred to a pulmonary rehabilitation program.  HEART FAILURE DUE TO AMYLOIDOSIS: Heart failure caused by amyloidosis, managed with medication and weight monitoring. -Temporarily double your torsemide  dose to manage edema and return your weight to 120 lbs. -Continue daily weight monitoring.  PULMONARY HYPERTENSION: High blood pressure in the lungs, managed with medication. -Continue Torsemide  20 mg daily -Monitor your weight daily. If it increases, can double up on fluid pill  CHRONIC INTERSTITIAL LUNG DISEASE: A lung condition contributing to your respiratory symptoms. -Continue using oxygen  and nebulizer treatments as needed.  CHRONIC LOWER EXTREMITY EDEMA: Swelling in your legs related to heart failure and amyloidosis. -Temporarily double your torsemide  dose to manage the swelling. - Can come back to normal dose once your weight is back to baseline  HISTORY OF PULMONARY EMBOLISM: Previous blood clots in the lungs, managed with medication. -Continue taking apixaban  2.5 mg to prevent further blood clots.   Contains text generated by [employer].

## 2024-02-17 NOTE — Assessment & Plan Note (Signed)
 Pulmonary hypertension likely group II due to HFpEF. NYHA Class III/IV - Diuretic plan per below - Monitor weight daily - Increase diuretic regimen as needed - Continue oxygen  therapy to maintain SPO2 > 92% given hx of PH.

## 2024-02-18 DIAGNOSIS — I509 Heart failure, unspecified: Secondary | ICD-10-CM | POA: Diagnosis not present

## 2024-02-18 DIAGNOSIS — E782 Mixed hyperlipidemia: Secondary | ICD-10-CM | POA: Diagnosis not present

## 2024-02-18 DIAGNOSIS — I272 Pulmonary hypertension, unspecified: Secondary | ICD-10-CM | POA: Diagnosis not present

## 2024-02-18 DIAGNOSIS — J849 Interstitial pulmonary disease, unspecified: Secondary | ICD-10-CM | POA: Diagnosis not present

## 2024-02-22 DIAGNOSIS — H353123 Nonexudative age-related macular degeneration, left eye, advanced atrophic without subfoveal involvement: Secondary | ICD-10-CM | POA: Diagnosis not present

## 2024-02-22 DIAGNOSIS — H35352 Cystoid macular degeneration, left eye: Secondary | ICD-10-CM | POA: Diagnosis not present

## 2024-02-22 DIAGNOSIS — H353114 Nonexudative age-related macular degeneration, right eye, advanced atrophic with subfoveal involvement: Secondary | ICD-10-CM | POA: Diagnosis not present

## 2024-02-22 DIAGNOSIS — H353211 Exudative age-related macular degeneration, right eye, with active choroidal neovascularization: Secondary | ICD-10-CM | POA: Diagnosis not present

## 2024-02-22 DIAGNOSIS — H3321 Serous retinal detachment, right eye: Secondary | ICD-10-CM | POA: Diagnosis not present

## 2024-02-22 DIAGNOSIS — H43812 Vitreous degeneration, left eye: Secondary | ICD-10-CM | POA: Diagnosis not present

## 2024-02-22 DIAGNOSIS — H353221 Exudative age-related macular degeneration, left eye, with active choroidal neovascularization: Secondary | ICD-10-CM | POA: Diagnosis not present

## 2024-02-23 ENCOUNTER — Encounter (HOSPITAL_COMMUNITY)
Admission: RE | Admit: 2024-02-23 | Discharge: 2024-02-23 | Disposition: A | Discharge status: Home / Self Care | Source: Ambulatory Visit | Attending: Pulmonary Disease | Admitting: Pulmonary Disease

## 2024-02-23 ENCOUNTER — Other Ambulatory Visit: Payer: Self-pay

## 2024-02-23 DIAGNOSIS — J9611 Chronic respiratory failure with hypoxia: Secondary | ICD-10-CM | POA: Insufficient documentation

## 2024-02-23 DIAGNOSIS — J209 Acute bronchitis, unspecified: Secondary | ICD-10-CM

## 2024-02-23 DIAGNOSIS — J40 Bronchitis, not specified as acute or chronic: Secondary | ICD-10-CM

## 2024-02-23 DIAGNOSIS — J849 Interstitial pulmonary disease, unspecified: Secondary | ICD-10-CM | POA: Insufficient documentation

## 2024-02-23 DIAGNOSIS — I272 Pulmonary hypertension, unspecified: Secondary | ICD-10-CM | POA: Insufficient documentation

## 2024-02-23 NOTE — Progress Notes (Signed)
 Virtual orientation visit completed for pulmonary rehab with chronic respiratory failure/pulmonary HTN and ILD. On-site orientation visit scheduled for 02/28/24 at 2:15.

## 2024-02-28 ENCOUNTER — Encounter (HOSPITAL_COMMUNITY)

## 2024-03-01 DIAGNOSIS — I43 Cardiomyopathy in diseases classified elsewhere: Secondary | ICD-10-CM | POA: Diagnosis not present

## 2024-03-01 DIAGNOSIS — E8581 Light chain (AL) amyloidosis: Secondary | ICD-10-CM | POA: Diagnosis not present

## 2024-03-01 DIAGNOSIS — E854 Organ-limited amyloidosis: Secondary | ICD-10-CM | POA: Diagnosis not present

## 2024-03-06 ENCOUNTER — Encounter (HOSPITAL_COMMUNITY)
Admission: RE | Admit: 2024-03-06 | Discharge: 2024-03-06 | Disposition: A | Source: Ambulatory Visit | Attending: Pulmonary Disease | Admitting: Pulmonary Disease

## 2024-03-06 VITALS — Ht 67.0 in | Wt 123.9 lb

## 2024-03-06 DIAGNOSIS — I272 Pulmonary hypertension, unspecified: Secondary | ICD-10-CM | POA: Diagnosis not present

## 2024-03-06 DIAGNOSIS — J849 Interstitial pulmonary disease, unspecified: Secondary | ICD-10-CM

## 2024-03-06 DIAGNOSIS — J9611 Chronic respiratory failure with hypoxia: Secondary | ICD-10-CM

## 2024-03-06 NOTE — Progress Notes (Signed)
 Patient attend orientation today.  Patient is attending Pulmonary Rehabilitation Program.  Documentation for diagnosis can be found in [facility].  Reviewed medical chart, RPE/RPD, gym safety, and program guidelines.  Patient was fitted to equipment they will be using during rehab.  Patient is scheduled to start exercise on 03/13/24.   Initial ITP created and sent for review and signature by Dr. Anton Kelp, Medical Director for Pulmonary Rehabilitation Program.

## 2024-03-06 NOTE — Progress Notes (Deleted)
 Cardiac Individual Treatment Plan  Patient Details  Name: Cindy [name] MRN: [id number] Date of Birth: [birth year] Referring Provider:   Flowsheet Row PULMONARY REHAB OTHER RESP ORIENTATION from 03/06/2024 in [facility] CARDIAC REHABILITATION  Referring Provider Len Cools MD    Initial Encounter Date:  Flowsheet Row PULMONARY REHAB OTHER RESP ORIENTATION from 03/06/2024 in [place] [facility] CARDIAC REHABILITATION  Date 03/06/24    Visit Diagnosis: Chronic respiratory failure with hypoxia (HCC)  Pulmonary HTN (HCC)  ILD (interstitial lung disease) (HCC)  Patient's Home Medications on Admission:  Current Outpatient Medications:    acetaminophen  (TYLENOL ) 650 MG CR tablet, Take 650 mg by mouth every 8 (eight) hours as needed for pain or fever., Disp: , Rfl:    albuterol  (PROVENTIL ) (2.5 MG/3ML) 0.083% nebulizer solution, Take 3 mLs (2.5 mg total) by nebulization every 6 (six) hours as needed for wheezing or shortness of breath., Disp: 75 mL, Rfl: 5   albuterol  (PROVENTIL ) (2.5 MG/3ML) 0.083% nebulizer solution, Take 3 mLs (2.5 mg total) by nebulization every 6 (six) hours as needed for wheezing or shortness of breath. 2-3 times a week, Disp: 75 mL, Rfl: 0   ALPRAZolam  (XANAX ) 0.5 MG tablet, Take 1 tablet (0.5 mg total) by mouth at bedtime as needed for anxiety., Disp: 30 tablet, Rfl: 3   amoxicillin (AMOXIL) 875 MG tablet, Take 875 mg by mouth 2 (two) times daily., Disp: , Rfl:    apixaban  (ELIQUIS ) 2.5 MG TABS tablet, Take 1 tablet (2.5 mg total) by mouth 2 (two) times daily., Disp: 180 tablet, Rfl: 1   BIOTIN PO, Take 1 tablet by mouth daily., Disp: , Rfl:    Cholecalciferol  50 MCG (2000 UT) CAPS, Take 1 capsule by mouth daily., Disp: , Rfl:    clobetasol (TEMOVATE) 0.05 % external solution, Apply 1 Application topically 2 (two) times daily., Disp: , Rfl:    cyanocobalamin (VITAMIN B12) 1000 MCG tablet, Take 1,000 mcg by mouth daily., Disp: , Rfl:    FARXIGA  10 MG TABS tablet,  TAKE 1 TABLET BY MOUTH DAILY, Disp: 90 tablet, Rfl: 3   fentaNYL  (DURAGESIC ) 25 MCG/HR, Place 1 patch onto the skin every 3 (three) days., Disp: , Rfl:    Fluocinolone Acetonide 0.01 % OIL, Place 5 drops into both ears as needed (itching). As needed for itching, Disp: , Rfl:    levothyroxine  (SYNTHROID ) 137 MCG tablet, Take 137 mcg by mouth daily., Disp: , Rfl:    metoprolol  succinate (TOPROL -XL) 25 MG 24 hr tablet, Take 0.5-1 tablets (12.5-25 mg total) by mouth in the morning and at bedtime. Patient takes 1 tablet (25mg ) in the morning and 1/2 of a tablet (12.5mg ) in the evening (Patient taking differently: Take 25 mg by mouth daily. Takes 1/2), Disp: , Rfl:    Multiple Vitamins-Minerals (PRESERVISION AREDS PO), Take 1 capsule by mouth daily., Disp: , Rfl:    NYAMYC powder, Apply 1 Application topically 2 (two) times daily., Disp: , Rfl:    ondansetron  (ZOFRAN ) 4 MG tablet, Take 4 mg by mouth every 8 (eight) hours as needed for nausea or vomiting., Disp: , Rfl:    Polyethyl Glycol-Propyl Glycol (SYSTANE OP), Apply 1 drop to eye daily., Disp: , Rfl:    potassium chloride  (KLOR-CON  M) 10 MEQ tablet, Take 10 mEq by mouth daily., Disp: , Rfl:    Probiotic Product (ALIGN) 4 MG CAPS, Take 1 tablet by mouth every morning., Disp: , Rfl:    senna (SENOKOT) 8.6 MG tablet, Take 2 tablets by mouth  as needed for constipation., Disp: , Rfl:    sodium chloride  (OCEAN) 0.65 % nasal spray, Place 2 sprays into the nose daily as needed for congestion., Disp: , Rfl:    torsemide  (DEMADEX ) 20 MG tablet, Take 1 tablet (20 mg total) by mouth daily., Disp: , Rfl:   Past Medical History: Past Medical History:  Diagnosis Date   Amyloidosis (HCC)    cancer   Cancer (HCC)    CHF (congestive heart failure) (HCC)    Amyloidosis   Diarrhea    Diverticulosis of colon (without mention of hemorrhage)    Edema    Esophagitis, unspecified    Family history of malignant neoplasm of gastrointestinal tract    Fibrosis of lung  (HCC) 03/14/2013   Flatulence, eructation, and gas pain    GERD (gastroesophageal reflux disease)    Left sided ulcerative colitis (HCC)    Macular degeneration    left eye   Maxillary sinus mass    Osteoporosis, unspecified    Other and unspecified hyperlipidemia    Other and unspecified noninfectious gastroenteritis and colitis(558.9)    Paroxysmal atrial fibrillation (HCC) 07/09/2019   Peripheral vascular disease    Pneumonia    Stricture and stenosis of esophagus    Unspecified hypothyroidism    URI (upper respiratory infection)     Tobacco Use: Social History   Tobacco Use  Smoking Status Never   Passive exposure: Never  Smokeless Tobacco Never    Labs: Review Flowsheet  More data may exist      Latest Ref Rng & Units 07/15/2012 03/14/2013 06/29/2013 12/21/2013 04/06/2014  Labs for ITP Cardiac and Pulmonary Rehab  Cholestrol 0 - 200 mg/dL 778  - 799  757  798   LDL (calc) 0 - 99 mg/dL 845  - 859  816  852   HDL-C >39.00 mg/dL 50  - 49  56.29  60.69   Trlycerides 0.0 - 149.0 mg/dL 86  - 57  24.9  26.9   Hemoglobin A1c <5.7 % - 6.1  - - -     Exercise Target Goals: Exercise Program Goal: Individual exercise prescription set using results from initial 6 min walk test and THRR while considering  patient's activity barriers and safety.   Exercise Prescription Goal: Initial exercise prescription builds to 30-45 minutes a day of aerobic activity, 2-3 days per week.  Home exercise guidelines will be given to patient during program as part of exercise prescription that the participant will acknowledge.   Education: Aerobic Exercise: - Group verbal and visual presentation on the components of exercise prescription. Introduces F.I.T.T principle from ACSM for exercise prescriptions.  Reviews F.I.T.T. principles of aerobic exercise including progression. Written material provided at class time.   Education: Resistance Exercise: - Group verbal and visual presentation on the  components of exercise prescription. Introduces F.I.T.T principle from ACSM for exercise prescriptions  Reviews F.I.T.T. principles of resistance exercise including progression. Written material provided at class time.    Education: Exercise & Equipment Safety: - Individual verbal instruction and demonstration of equipment use and safety with use of the equipment.   Education: Exercise Physiology & General Exercise Guidelines: - Group verbal and written instruction with models to review the exercise physiology of the cardiovascular system and associated critical values. Provides general exercise guidelines with specific guidelines to those with heart or lung disease. Written material provided at class time.   Education: Flexibility, Balance, Mind/Body Relaxation: - Group verbal and visual presentation with interactive activity on the  components of exercise prescription. Introduces F.I.T.T principle from ACSM for exercise prescriptions. Reviews F.I.T.T. principles of flexibility and balance exercise training including progression. Also discusses the mind body connection.  Reviews various relaxation techniques to help reduce and manage stress (i.e. Deep breathing, progressive muscle relaxation, and visualization). Balance handout provided to take home. Written material provided at class time.   Activity Barriers & Risk Stratification:  Activity Barriers & Cardiac Risk Stratification - 02/23/24 1305       Activity Barriers & Cardiac Risk Stratification   Activity Barriers Shortness of Breath;Balance Concerns;Assistive Device;Deconditioning;Arthritis   Uses cane and walker when needed.         6 Minute Walk:  6 Minute Walk     Row Name 03/06/24 1521         6 Minute Walk   Phase Initial     Distance 190 feet     Walk Time 2 minutes     # of Rest Breaks 0  stopped due to desaturation     MPH 1.08     METS 0.48     RPE 17     Perceived Dyspnea  3     VO2 Peak 1.68     Symptoms  Yes (comment)     Comments SOB, desaturation     Resting HR 82 bpm     Resting BP 100/62     Resting Oxygen  Saturation  94 %     Exercise Oxygen  Saturation  during 6 min walk 75 %     Max Ex. HR 96 bpm     Max Ex. BP 148/70     2 Minute Post BP 136/74       Interval HR   1 Minute HR 96     2 Minute HR 96     2 Minute Post HR 92     Interval Heart Rate? Yes       Interval Oxygen    Interval Oxygen ? Yes     Baseline Oxygen  Saturation % 94 %     1 Minute Oxygen  Saturation % 85 %     1 Minute Liters of Oxygen  2 L  NCP POC     2 Minute Oxygen  Saturation % 75 %     2 Minute Liters of Oxygen  2 L     2 Minute Post Oxygen  Saturation % 91 %     2 Minute Post Liters of Oxygen  2 L        Oxygen  Initial Assessment:  Oxygen  Initial Assessment - 03/06/24 1527       Home Oxygen    Home Oxygen  Device Home Concentrator;E-Tanks    Sleep Oxygen  Prescription Continuous    Liters per minute 2    Home Exercise Oxygen  Prescription Continuous    Liters per minute 2    Home Resting Oxygen  Prescription Continuous    Liters per minute 2    Compliance with Home Oxygen  Use Yes      Initial 6 min Walk   Oxygen  Used Portable Concentrator;Pulsed    Liters per minute 2      Program Oxygen  Prescription   Program Oxygen  Prescription Continuous;E-Tanks    Liters per minute 2      Intervention   Short Term Goals To learn and exhibit compliance with exercise, home and travel O2 prescription;To learn and understand importance of monitoring SPO2 with pulse oximeter and demonstrate accurate use of the pulse oximeter.;To learn and understand importance of maintaining oxygen  saturations>88%;To learn and demonstrate proper  pursed lip breathing techniques or other breathing techniques. ;To learn and demonstrate proper use of respiratory medications    Long  Term Goals Exhibits compliance with exercise, home  and travel O2 prescription;Verbalizes importance of monitoring SPO2 with pulse oximeter and return  demonstration;Maintenance of O2 saturations>88%;Exhibits proper breathing techniques, such as pursed lip breathing or other method taught during program session;Compliance with respiratory medication;Demonstrates proper use of MDI's          Oxygen  Re-Evaluation:   Oxygen  Discharge (Final Oxygen  Re-Evaluation):   Initial Exercise Prescription:  Initial Exercise Prescription - 03/06/24 1500       Date of Initial Exercise RX and Referring Provider   Date 03/06/24    Referring Provider Algharnim, Fahid MD      Oxygen    Oxygen  Continuous    Liters 2    Maintain Oxygen  Saturation 88% or higher      NuStep   Level 1    SPM 60    Minutes 15    METs 1.5      REL-XR   Level 1    Speed 50    Minutes 15    METs 1.5      Prescription Details   Frequency (times per week) 2    Duration Progress to 30 minutes of continuous aerobic without signs/symptoms of physical distress      Intensity   THRR 40-80% of Max Heartrate 105-122    Ratings of Perceived Exertion 11-13    Perceived Dyspnea 0-4      Progression   Progression Continue to progress workloads to maintain intensity without signs/symptoms of physical distress.      Resistance Training   Training Prescription Yes    Weight 3 lb    Reps 10-15          Perform Capillary Blood Glucose checks as needed.  Exercise Prescription Changes:   Exercise Prescription Changes     Row Name 03/06/24 1500             Response to Exercise   Blood Pressure (Admit) 100/62       Blood Pressure (Exercise) 148/70       Blood Pressure (Exit) 126/64       Heart Rate (Admit) 82 bpm       Heart Rate (Exercise) 96 bpm       Heart Rate (Exit) 92 bpm       Oxygen  Saturation (Admit) 94 %       Oxygen  Saturation (Exercise) 75 %       Oxygen  Saturation (Exit) 91 %       Rating of Perceived Exertion (Exercise) 17       Perceived Dyspnea (Exercise) 3       Symptoms SOb, destaruation       Comments walk test results           Exercise Comments:   Exercise Comments     Row Name 03/06/24 1426           Exercise Comments Patient attend orientation today.  Patient is attending Pulmonary Rehabilitation Program.  Documentation for diagnosis can be found in [facility].  Reviewed medical chart, RPE/RPD, gym safety, and program guidelines.  Patient was fitted to equipment they will be using during rehab.  Patient is scheduled to start exercise on 03/13/24.   Initial ITP created and sent for review and signature by Dr. Anton Kelp, Medical Director for Pulmonary Rehabilitation Program.  Exercise Goals and Review:   Exercise Goals     Row Name 03/06/24 1524             Exercise Goals   Increase Physical Activity Yes       Intervention Provide advice, education, support and counseling about physical activity/exercise needs.;Develop an individualized exercise prescription for aerobic and resistive training based on initial evaluation findings, risk stratification, comorbidities and participant's personal goals.       Expected Outcomes Short Term: Attend rehab on a regular basis to increase amount of physical activity.;Long Term: Exercising regularly at least 3-5 days a week.;Long Term: Add in home exercise to make exercise part of routine and to increase amount of physical activity.       Increase Strength and Stamina Yes       Intervention Provide advice, education, support and counseling about physical activity/exercise needs.;Develop an individualized exercise prescription for aerobic and resistive training based on initial evaluation findings, risk stratification, comorbidities and participant's personal goals.       Expected Outcomes Short Term: Increase workloads from initial exercise prescription for resistance, speed, and METs.;Short Term: Perform resistance training exercises routinely during rehab and add in resistance training at home;Long Term: Improve cardiorespiratory fitness, muscular endurance  and strength as measured by increased METs and functional capacity ( )       Able to understand and use rate of perceived exertion (RPE) scale Yes       Intervention Provide education and explanation on how to use RPE scale       Expected Outcomes Long Term:  Able to use RPE to guide intensity level when exercising independently;Short Term: Able to use RPE daily in rehab to express subjective intensity level       Able to understand and use Dyspnea scale Yes       Intervention Provide education and explanation on how to use Dyspnea scale       Expected Outcomes Short Term: Able to use Dyspnea scale daily in rehab to express subjective sense of shortness of breath during exertion;Long Term: Able to use Dyspnea scale to guide intensity level when exercising independently       Knowledge and understanding of Target Heart Rate Range (THRR) Yes       Intervention Provide education and explanation of THRR including how the numbers were predicted and where they are located for reference       Expected Outcomes Short Term: Able to state/look up THRR;Long Term: Able to use THRR to govern intensity when exercising independently;Short Term: Able to use daily as guideline for intensity in rehab       Able to check pulse independently Yes       Intervention Provide education and demonstration on how to check pulse in carotid and radial arteries.;Review the importance of being able to check your own pulse for safety during independent exercise       Expected Outcomes Long Term: Able to check pulse independently and accurately;Short Term: Able to explain why pulse checking is important during independent exercise       Understanding of Exercise Prescription Yes       Intervention Provide education, explanation, and written materials on patient's individual exercise prescription       Expected Outcomes Long Term: Able to explain home exercise prescription to exercise independently;Short Term: Able to explain  program exercise prescription          Exercise Goals Re-Evaluation :   Discharge Exercise Prescription (Final Exercise  Prescription Changes):  Exercise Prescription Changes - 03/06/24 1500       Response to Exercise   Blood Pressure (Admit) 100/62    Blood Pressure (Exercise) 148/70    Blood Pressure (Exit) 126/64    Heart Rate (Admit) 82 bpm    Heart Rate (Exercise) 96 bpm    Heart Rate (Exit) 92 bpm    Oxygen  Saturation (Admit) 94 %    Oxygen  Saturation (Exercise) 75 %    Oxygen  Saturation (Exit) 91 %    Rating of Perceived Exertion (Exercise) 17    Perceived Dyspnea (Exercise) 3    Symptoms SOb, destaruation    Comments walk test results          Nutrition:  Target Goals: Understanding of nutrition guidelines, daily intake of sodium 1500mg , cholesterol 200mg , calories 30% from fat and 7% or less from saturated fats, daily to have 5 or more servings of fruits and vegetables.  Education: Nutrition 1 -Group instruction provided by verbal, written material, interactive activities, discussions, models, and posters to present general guidelines for heart healthy nutrition including macronutrients, label reading, and promoting whole foods over processed counterparts. Education serves as pensions consultant of discussion of heart healthy eating for all. Written material provided at class time.    Education: Nutrition 2 -Group instruction provided by verbal, written material, interactive activities, discussions, models, and posters to present general guidelines for heart healthy nutrition including sodium, cholesterol, and saturated fat. Providing guidance of habit forming to improve blood pressure, cholesterol, and body weight. Written material provided at class time.     Biometrics:  Pre Biometrics - 03/06/24 1525       Pre Biometrics   Height 5' 7 (1.702 m)    Weight 56.2 kg    Waist Circumference 29.5 inches    Hip Circumference 36 inches    Waist to Hip Ratio 0.82 %     BMI (Calculated) 19.4    Grip Strength 9.8 kg    Single Leg Stand 1.2 seconds           Nutrition Therapy Plan and Nutrition Goals:  Nutrition Therapy & Goals - 03/06/24 1526       Intervention Plan   Intervention Prescribe, educate and counsel regarding individualized specific dietary modifications aiming towards targeted core components such as weight, hypertension, lipid management, diabetes, heart failure and other comorbidities.;Nutrition handout(s) given to patient.    Expected Outcomes Short Term Goal: Understand basic principles of dietary content, such as calories, fat, sodium, cholesterol and nutrients.;Long Term Goal: Adherence to prescribed nutrition plan.          Nutrition Assessments:  MEDIFICTS Score Key: >=70 Need to make dietary changes  40-70 Heart Healthy Diet <= 40 Therapeutic Level Cholesterol Diet   Picture Your Plate Scores: <59 Unhealthy dietary pattern with much room for improvement. 41-50 Dietary pattern unlikely to meet recommendations for good health and room for improvement. 51-60 More healthful dietary pattern, with some room for improvement.  >60 Healthy dietary pattern, although there may be some specific behaviors that could be improved.    Nutrition Goals Re-Evaluation:   Nutrition Goals Discharge (Final Nutrition Goals Re-Evaluation):   Psychosocial: Target Goals: Acknowledge presence or absence of significant depression and/or stress, maximize coping skills, provide positive support system. Participant is able to verbalize types and ability to use techniques and skills needed for reducing stress and depression.   Education: Stress, Anxiety, and Depression - Group verbal and visual presentation to define topics covered.  Reviews how  body is impacted by stress, anxiety, and depression.  Also discusses healthy ways to reduce stress and to treat/manage anxiety and depression. Written material provided at class time.   Education: Sleep  Hygiene -Provides group verbal and written instruction about how sleep can affect your health.  Define sleep hygiene, discuss sleep cycles and impact of sleep habits. Review good sleep hygiene tips.   Initial Review & Psychosocial Screening:  Initial Psych Review & Screening - 02/23/24 1334       Initial Review   Current issues with None Identified      Family Dynamics   Good Support System? Yes    Comments Patient has a large extended family that supports her.      Barriers   Psychosocial barriers to participate in program The patient should benefit from training in stress management and relaxation.;There are no identifiable barriers or psychosocial needs.      Screening Interventions   Interventions Encouraged to exercise;To provide support and resources with identified psychosocial needs;Provide feedback about the scores to participant    Expected Outcomes Short Term goal: Utilizing psychosocial counselor, staff and physician to assist with identification of specific Stressors or current issues interfering with healing process. Setting desired goal for each stressor or current issue identified.;Long Term Goal: Stressors or current issues are controlled or eliminated.;Short Term goal: Identification and review with participant of any Quality of Life or Depression concerns found by scoring the questionnaire.;Long Term goal: The participant improves quality of Life and PHQ9 Scores as seen by post scores and/or verbalization of changes          Quality of Life Scores:   Scores of 19 and below usually indicate a poorer quality of life in these areas.  A difference of  2-3 points is a clinically meaningful difference.  A difference of 2-3 points in the total score of the Quality of Life Index has been associated with significant improvement in overall quality of life, self-image, physical symptoms, and general health in studies assessing change in quality of life.  PHQ-9: Review Flowsheet        03/06/2024 01/19/2014 01/05/2014 05/18/2013  Depression screen PHQ 2/9  Decreased Interest 0 0 0 0  Down, Depressed, Hopeless 0 0 0 0  PHQ - 2 Score 0 0 0 0  Altered sleeping 0 - - -  Tired, decreased energy 3 - - -  Change in appetite 3 - - -  Feeling bad or failure about yourself  0 - - -  Trouble concentrating 0 - - -  Moving slowly or fidgety/restless 0 - - -  Suicidal thoughts 0 - - -  PHQ-9 Score 6 - - -  Difficult doing work/chores Somewhat difficult - - -   Interpretation of Total Score  Total Score Depression Severity:  1-4 = Minimal depression, 5-9 = Mild depression, 10-14 = Moderate depression, 15-19 = Moderately severe depression, 20-27 = Severe depression   Psychosocial Evaluation and Intervention:  Psychosocial Evaluation - 02/23/24 1335       Psychosocial Evaluation & Interventions   Interventions Stress management education;Relaxation education;Encouraged to exercise with the program and follow exercise prescription    Comments Patient was referred to [facility] with chronic respiratory failure with hypoxia; pulmonary HTN and ILD. She denies any depression or anxiety. She says she sleeps well at night using Alprazalam. Her grandson lives in her basement but she says she rarely sees him, but he is there if she needs him. 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 She is looking forward to participating in the program. Her goals are to get stronger in her legs and feet and to improve her confidence in her ability to do things. She has no barriers identified to complete the program.    Expected Outcomes Short Term: Patient will start the program and attend consistently. Long Term: Patient will complete the program meeting personal goals.    Continue Psychosocial Services  Follow up required by staff          Psychosocial Re-Evaluation:   Psychosocial  Discharge (Final Psychosocial Re-Evaluation):   Vocational Rehabilitation: Provide vocational rehab assistance to qualifying candidates.   Vocational Rehab Evaluation & Intervention:  Vocational Rehab - 02/23/24 1318       Initial Vocational Rehab Evaluation & Intervention   Assessment shows need for Vocational Rehabilitation No      Vocational Rehab Re-Evaulation   Comments Retired.          Education: Education Goals: Education classes will be provided on a variety of topics geared toward better understanding of heart health and risk factor modification. Participant will state understanding/return demonstration of topics presented as noted by education test scores.  Learning Barriers/Preferences:  Learning Barriers/Preferences - 02/23/24 1318       Learning Barriers/Preferences   Learning Barriers None    Learning Preferences Audio;Skilled Demonstration          General Cardiac Education Topics:  AED/CPR: - Group verbal and written instruction with the use of models to demonstrate the basic use of the AED with the basic ABC's of resuscitation.   Test and Procedures: - Group verbal and visual presentation and models provide information about basic cardiac anatomy and function. Reviews the testing methods done to diagnose heart disease and the outcomes of the test results. Describes the treatment choices: Medical Management, Angioplasty, or Coronary Bypass Surgery for treating various heart conditions including Myocardial Infarction, Angina, Valve Disease, and Cardiac Arrhythmias. Written material provided at class time.   Medication Safety: - Group verbal and visual instruction to review commonly prescribed medications for heart and lung disease. Reviews the medication, class of the drug, and side effects. Includes the steps to properly store meds and maintain the prescription regimen. Written material provided at class time.   Intimacy: - Group verbal instruction  through game format to discuss how heart and lung disease can affect sexual intimacy. Written material provided at class time.   Know Your Numbers and Heart Failure: - Group verbal and visual instruction to discuss disease risk factors for cardiac and pulmonary disease and treatment options.  Reviews associated critical values for Overweight/Obesity, Hypertension, Cholesterol, and Diabetes.  Discusses basics of heart failure: signs/symptoms and treatments.  Introduces Heart Failure Zone chart for action plan for heart failure. Written material provided at class time.   Infection Prevention: - Provides verbal and written material to individual with discussion of infection control including proper hand washing and proper equipment cleaning during exercise session.   Falls Prevention: - Provides verbal and written material to individual with discussion of falls prevention and safety.   Other: -Provides group and verbal instruction on various topics (see comments)   Knowledge Questionnaire Score:  Knowledge Questionnaire Score - 03/06/24 1427       Knowledge Questionnaire Score   Pre Score 15/18  Core Components/Risk Factors/Patient Goals at Admission:  Personal Goals and Risk Factors at Admission - 03/06/24 1526       Core Components/Risk Factors/Patient Goals on Admission    Weight Management Weight Maintenance;Yes;Weight Gain    Intervention Weight Management: Develop a combined nutrition and exercise program designed to reach desired caloric intake, while maintaining appropriate intake of nutrient and fiber, sodium and fats, and appropriate energy expenditure required for the weight goal.;Weight Management: Provide education and appropriate resources to help participant work on and attain dietary goals.    Admit Weight 123 lb 14.4 oz (56.2 kg)    Goal Weight: Short Term 125 lb (56.7 kg)    Goal Weight: Long Term 125 lb (56.7 kg)    Expected Outcomes Short Term:  Continue to assess and modify interventions until short term weight is achieved;Long Term: Adherence to nutrition and physical activity/exercise program aimed toward attainment of established weight goal;Weight Maintenance: Understanding of the daily nutrition guidelines, which includes 25-35% calories from fat, 7% or less cal from saturated fats, less than 200mg  cholesterol, less than 1.5gm of sodium, & 5 or more servings of fruits and vegetables daily;Weight Gain: Understanding of general recommendations for a high calorie, high protein meal plan that promotes weight gain by distributing calorie intake throughout the day with the consumption for 4-5 meals, snacks, and/or supplements    Improve shortness of breath with ADL's Yes    Intervention Provide education, individualized exercise plan and daily activity instruction to help decrease symptoms of SOB with activities of daily living.    Expected Outcomes Short Term: Improve cardiorespiratory fitness to achieve a reduction of symptoms when performing ADLs;Long Term: Be able to perform more ADLs without symptoms or delay the onset of symptoms    Increase knowledge of respiratory medications and ability to use respiratory devices properly  Yes    Intervention Provide education and demonstration as needed of appropriate use of medications, inhalers, and oxygen  therapy.    Expected Outcomes Short Term: Achieves understanding of medications use. Understands that oxygen  is a medication prescribed by physician. Demonstrates appropriate use of inhaler and oxygen  therapy.;Long Term: Maintain appropriate use of medications, inhalers, and oxygen  therapy.    Hypertension Yes    Intervention Provide education on lifestyle modifcations including regular physical activity/exercise, weight management, moderate sodium restriction and increased consumption of fresh fruit, vegetables, and low fat dairy, alcohol  moderation, and smoking cessation.;Monitor prescription use  compliance.    Expected Outcomes Short Term: Continued assessment and intervention until BP is < 140/70mm HG in hypertensive participants. < 130/52mm HG in hypertensive participants with diabetes, heart failure or chronic kidney disease.;Long Term: Maintenance of blood pressure at goal levels.          Education:Diabetes - Individual verbal and written instruction to review signs/symptoms of diabetes, desired ranges of glucose level fasting, after meals and with exercise. Acknowledge that pre and post exercise glucose checks will be done for 3 sessions at entry of program.   Core Components/Risk Factors/Patient Goals Review:    Core Components/Risk Factors/Patient Goals at Discharge (Final Review):    ITP Comments:  ITP Comments     Row Name 02/23/24 1341 03/06/24 1425         ITP Comments Virtual orientation visit completed for pulmonary rehab with chronic respiratory failure/pulmonary HTN and ILD. On-site orientation visit scheduled for 02/28/24 at 2:15. Patient attend orientation today.  Patient is attending Pulmonary Rehabilitation Program.  Documentation for diagnosis can be found in [facility].  Reviewed medical  chart, RPE/RPD, gym safety, and program guidelines.  Patient was fitted to equipment they will be using during rehab.  Patient is scheduled to start exercise on 03/13/24.   Initial ITP created and sent for review and signature by Dr. Anton Kelp, Medical Director for Pulmonary Rehabilitation Program.         Comments: Initial ITP.

## 2024-03-06 NOTE — Progress Notes (Signed)
 Pulmonary Individual Treatment Plan  Patient Details  Name: Cindy [name] MRN: [id number] Date of Birth: 09/08/1932 Referring Provider:   Flowsheet Row PULMONARY REHAB OTHER RESP ORIENTATION from 03/06/2024 in [facility] CARDIAC REH[facility]IL[facility]  Referring Provider Len Cools MD    Initial Encounter Date:  Flowsheet Row PULMONARY REHAB OTHER RESP ORIENTATION from 03/06/2024 in [facility]  Date 03/06/24    Visit Diagnosis: Chronic respiratory failure with hypoxia (HCC)  Pulmonary HTN (HCC)  ILD (interstitial lung disease) (HCC)  Patient's Home Medications on Admission:  Current Outpatient Medications:    acetaminophen  (TYLENOL ) 650 MG CR tablet, Take 650 mg by mouth every 8 (eight) hours as needed for pain or fever., Disp: , Rfl:    albuterol  (PROVENTIL ) (2.5 MG/3ML) 0.083% nebulizer solution, Take 3 mLs (2.5 mg total) by nebulization every 6 (six) hours as needed for wheezing or shortness of breath., Disp: 75 mL, Rfl: 5   albuterol  (PROVENTIL ) (2.5 MG/3ML) 0.083% nebulizer solution, Take 3 mLs (2.5 mg total) by nebulization every 6 (six) hours as needed for wheezing or shortness of breath. 2-3 times a week, Disp: 75 mL, Rfl: 0   ALPRAZolam  (XANAX ) 0.5 MG tablet, Take 1 tablet (0.5 mg total) by mouth at bedtime as needed for anxiety., Disp: 30 tablet, Rfl: 3   amoxicillin (AMOXIL) 875 MG tablet, Take 875 mg by mouth 2 (two) times daily., Disp: , Rfl:    apixaban  (ELIQUIS ) 2.5 MG TABS tablet, Take 1 tablet (2.5 mg total) by mouth 2 (two) times daily., Disp: 180 tablet, Rfl: 1   BIOTIN PO, Take 1 tablet by mouth daily., Disp: , Rfl:    Cholecalciferol  50 MCG (2000 UT) CAPS, Take 1 capsule by mouth daily., Disp: , Rfl:    clobetasol (TEMOVATE) 0.05 % external solution, Apply 1 Application topically 2 (two) times daily., Disp: , Rfl:    cyanocobalamin (VITAMIN B12) 1000 MCG tablet, Take 1,000 mcg by mouth daily., Disp: , Rfl:    FARXIGA  10 MG TABS tablet,  TAKE 1 TABLET BY MOUTH DAILY, Disp: 90 tablet, Rfl: 3   fentaNYL  (DURAGESIC ) 25 MCG/HR, Place 1 patch onto the skin every 3 (three) days., Disp: , Rfl:    Fluocinolone Acetonide 0.01 % OIL, Place 5 drops into both ears as needed (itching). As needed for itching, Disp: , Rfl:    levothyroxine  (SYNTHROID ) 137 MCG tablet, Take 137 mcg by mouth daily., Disp: , Rfl:    metoprolol  succinate (TOPROL -XL) 25 MG 24 hr tablet, Take 0.5-1 tablets (12.5-25 mg total) by mouth in the morning and at bedtime. Patient takes 1 tablet (25mg ) in the morning and 1/2 of a tablet (12.5mg ) in the evening (Patient taking differently: Take 25 mg by mouth daily. Takes 1/2), Disp: , Rfl:    Multiple Vitamins-Minerals (PRESERVISION AREDS PO), Take 1 capsule by mouth daily., Disp: , Rfl:    NYAMYC powder, Apply 1 Application topically 2 (two) times daily., Disp: , Rfl:    ondansetron  (ZOFRAN ) 4 MG tablet, Take 4 mg by mouth every 8 (eight) hours as needed for nausea or vomiting., Disp: , Rfl:    Polyethyl Glycol-Propyl Glycol (SYSTANE OP), Apply 1 drop to eye daily., Disp: , Rfl:    potassium chloride  (KLOR-CON  M) 10 MEQ tablet, Take 10 mEq by mouth daily., Disp: , Rfl:    Probiotic Product (ALIGN) 4 MG CAPS, Take 1 tablet by mouth every morning., Disp: , Rfl:    senna (SENOKOT) 8.6 MG tablet, Take 2 tablets by mouth  as needed for constipation., Disp: , Rfl:    sodium chloride  (OCEAN) 0.65 % nasal spray, Place 2 sprays into the nose daily as needed for congestion., Disp: , Rfl:    torsemide  (DEMADEX ) 20 MG tablet, Take 1 tablet (20 mg total) by mouth daily., Disp: , Rfl:   Past Medical History: Past Medical History:  Diagnosis Date   Amyloidosis (HCC)    cancer   Cancer (HCC)    CHF (congestive heart failure) (HCC)    Amyloidosis   Diarrhea    Diverticulosis of colon (without mention of hemorrhage)    Edema    Esophagitis, unspecified    Family history of malignant neoplasm of gastrointestinal tract    Fibrosis of lung  (HCC) 03/14/2013   Flatulence, eructation, and gas pain    GERD (gastroesophageal reflux disease)    Left sided ulcerative colitis (HCC)    Macular degeneration    left eye   Maxillary sinus mass    Osteoporosis, unspecified    Other and unspecified hyperlipidemia    Other and unspecified noninfectious gastroenteritis and colitis(558.9)    Paroxysmal atrial fibrillation (HCC) 07/09/2019   Peripheral vascular disease    Pneumonia    Stricture and stenosis of esophagus    Unspecified hypothyroidism    URI (upper respiratory infection)     Tobacco Use: Social History   Tobacco Use  Smoking Status Never   Passive exposure: Never  Smokeless Tobacco Never    Labs: Review Flowsheet  More data may exist      Latest Ref Rng & Units 07/15/2012 03/14/2013 06/29/2013 12/21/2013 04/06/2014  Labs for ITP Cardiac and Pulmonary Rehab  Cholestrol 0 - 200 mg/dL 778  - 799  757  798   LDL (calc) 0 - 99 mg/dL 845  - 859  816  852   HDL-C >39.00 mg/dL 50  - 49  56.29  60.69   Trlycerides 0.0 - 149.0 mg/dL 86  - 57  24.9  26.9   Hemoglobin A1c <5.7 % - 6.1  - - -     Pulmonary Assessment Scores:  Pulmonary Assessment Scores     Row Name 03/06/24 1427         ADL UCSD   ADL Phase Entry     SOB Score total 82     Rest 2     Walk 3     Stairs 5     Bath 3     Dress 4     Shop 4       CAT Score   CAT Score 21       mMRC Score   mMRC Score 3        UCSD: Self-administered rating of dyspnea associated with activities of daily living (ADLs) 6-point scale (0 = not at all to 5 = maximal or unable to do because of breathlessness)  Scoring Scores range from 0 to 120.  Minimally important difference is 5 units  CAT: CAT can identify the health impairment of COPD patients and is better correlated with disease progression.  CAT has a scoring range of zero to 40. The CAT score is classified into four groups of low (less than 10), medium (10 - 20), high (21-30) and very high (31-40)  based on the impact level of disease on health status. A CAT score over 10 suggests significant symptoms.  A worsening CAT score could be explained by an exacerbation, poor medication adherence, poor inhaler technique, or progression of COPD  or comorbid conditions.  CAT MCID is 2 points  mMRC: mMRC (Modified Medical Research Council) Dyspnea Scale is used to assess the degree of baseline functional disability in patients of respiratory disease due to dyspnea. No minimal important difference is established. A decrease in score of 1 point or greater is considered a positive change.   Pulmonary Function Assessment:  Pulmonary Function Assessment - 03/06/24 1527       Breath   Shortness of Breath Fear of Shortness of Breath;Yes;Limiting activity;Panic with Shortness of Breath          Exercise Target Goals: Exercise Program Goal: Individual exercise prescription set using results from initial 6 min walk test and THRR while considering  patient's activity barriers and safety.   Exercise Prescription Goal: Initial exercise prescription builds to 30-45 minutes a day of aerobic activity, 2-3 days per week.  Home exercise guidelines will be given to patient during program as part of exercise prescription that the participant will acknowledge.  Education: Aerobic Exercise: - Group verbal and visual presentation on the components of exercise prescription. Introduces F.I.T.T principle from ACSM for exercise prescriptions.  Reviews F.I.T.T. principles of aerobic exercise including progression. Written material provided at class time.   Education: Resistance Exercise: - Group verbal and visual presentation on the components of exercise prescription. Introduces F.I.T.T principle from ACSM for exercise prescriptions  Reviews F.I.T.T. principles of resistance exercise including progression. Written material provided at class time.    Education: Exercise & Equipment Safety: - Individual verbal  instruction and demonstration of equipment use and safety with use of the equipment.   Education: Exercise Physiology & General Exercise Guidelines: - Group verbal and written instruction with models to review the exercise physiology of the cardiovascular system and associated critical values. Provides general exercise guidelines with specific guidelines to those with heart or lung disease.    Education: Flexibility, Balance, Mind/Body Relaxation: - Group verbal and visual presentation with interactive activity on the components of exercise prescription. Introduces F.I.T.T principle from ACSM for exercise prescriptions. Reviews F.I.T.T. principles of flexibility and balance exercise training including progression. Also discusses the mind body connection.  Reviews various relaxation techniques to help reduce and manage stress (i.e. Deep breathing, progressive muscle relaxation, and visualization). Balance handout provided to take home. Written material provided at class time.   Activity Barriers & Risk Stratification:  Activity Barriers & Cardiac Risk Stratification - 02/23/24 1305       Activity Barriers & Cardiac Risk Stratification   Activity Barriers Shortness of Breath;Balance Concerns;Assistive Device;Deconditioning;Arthritis   Uses cane and walker when needed.         6 Minute Walk:  6 Minute Walk     Row Name 03/06/24 1521         6 Minute Walk   Phase Initial     Distance 190 feet     Walk Time 2 minutes     # of Rest Breaks 0  stopped due to desaturation     MPH 1.08     METS 0.48     RPE 17     Perceived Dyspnea  3     VO2 Peak 1.68     Symptoms Yes (comment)     Comments SOB, desaturation     Resting HR 82 bpm     Resting BP 100/62     Resting Oxygen  Saturation  94 %     Exercise Oxygen  Saturation  during 6 min walk 75 %     Max  Ex. HR 96 bpm     Max Ex. BP 148/70     2 Minute Post BP 136/74       Interval HR   1 Minute HR 96     2 Minute HR 96     2  Minute Post HR 92     Interval Heart Rate? Yes       Interval Oxygen    Interval Oxygen ? Yes     Baseline Oxygen  Saturation % 94 %     1 Minute Oxygen  Saturation % 85 %     1 Minute Liters of Oxygen  2 L  NCP POC     2 Minute Oxygen  Saturation % 75 %     2 Minute Liters of Oxygen  2 L     2 Minute Post Oxygen  Saturation % 91 %     2 Minute Post Liters of Oxygen  2 L       Oxygen  Initial Assessment:  Oxygen  Initial Assessment - 03/06/24 1527       Home Oxygen    Home Oxygen  Device Home Concentrator;E-Tanks    Sleep Oxygen  Prescription Continuous    Liters per minute 2    Home Exercise Oxygen  Prescription Continuous    Liters per minute 2    Home Resting Oxygen  Prescription Continuous    Liters per minute 2    Compliance with Home Oxygen  Use Yes      Initial 6 min Walk   Oxygen  Used Portable Concentrator;Pulsed    Liters per minute 2      Program Oxygen  Prescription   Program Oxygen  Prescription Continuous;E-Tanks    Liters per minute 2      Intervention   Short Term Goals To learn and exhibit compliance with exercise, home and travel O2 prescription;To learn and understand importance of monitoring SPO2 with pulse oximeter and demonstrate accurate use of the pulse oximeter.;To learn and understand importance of maintaining oxygen  saturations>88%;To learn and demonstrate proper pursed lip breathing techniques or other breathing techniques. ;To learn and demonstrate proper use of respiratory medications    Long  Term Goals Exhibits compliance with exercise, home  and travel O2 prescription;Verbalizes importance of monitoring SPO2 with pulse oximeter and return demonstration;Maintenance of O2 saturations>88%;Exhibits proper breathing techniques, such as pursed lip breathing or other method taught during program session;Compliance with respiratory medication;Demonstrates proper use of MDI's          Oxygen  Re-Evaluation:   Oxygen  Discharge (Final Oxygen   Re-Evaluation):   Initial Exercise Prescription:  Initial Exercise Prescription - 03/06/24 1500       Date of Initial Exercise RX and Referring Provider   Date 03/06/24    Referring Provider Algharnim, Paula MD      Oxygen    Oxygen  Continuous    Liters 2    Maintain Oxygen  Saturation 88% or higher      NuStep   Level 1    SPM 60    Minutes 15    METs 1.5      REL-XR   Level 1    Speed 50    Minutes 15    METs 1.5      Prescription Details   Frequency (times per week) 2    Duration Progress to 30 minutes of continuous aerobic without signs/symptoms of physical distress      Intensity   THRR 40-80% of Max Heartrate 105-122    Ratings of Perceived Exertion 11-13    Perceived Dyspnea 0-4      Progression  Progression Continue to progress workloads to maintain intensity without signs/symptoms of physical distress.      Resistance Training   Training Prescription Yes    Weight 3 lb    Reps 10-15          Perform Capillary Blood Glucose checks as needed.  Exercise Prescription Changes:   Exercise Prescription Changes     Row Name 03/06/24 1500             Response to Exercise   Blood Pressure (Admit) 100/62       Blood Pressure (Exercise) 148/70       Blood Pressure (Exit) 126/64       Heart Rate (Admit) 82 bpm       Heart Rate (Exercise) 96 bpm       Heart Rate (Exit) 92 bpm       Oxygen  Saturation (Admit) 94 %       Oxygen  Saturation (Exercise) 75 %       Oxygen  Saturation (Exit) 91 %       Rating of Perceived Exertion (Exercise) 17       Perceived Dyspnea (Exercise) 3       Symptoms SOb, destaruation       Comments walk test results          Exercise Comments:   Exercise Comments     Row Name 03/06/24 1426           Exercise Comments Patient attend orientation today.  Patient is attending Pulmonary Rehabilitation Program.  Documentation for diagnosis can be found in [facility].  Reviewed medical chart, RPE/RPD, gym safety, and program  guidelines.  Patient was fitted to equipment they will be using during rehab.  Patient is scheduled to start exercise on 03/13/24.   Initial ITP created and sent for review and signature by Dr. Anton Kelp, Medical Director for Pulmonary Rehabilitation Program.          Exercise Goals and Review:   Exercise Goals     Row Name 03/06/24 1524             Exercise Goals   Increase Physical Activity Yes       Intervention Provide advice, education, support and counseling about physical activity/exercise needs.;Develop an individualized exercise prescription for aerobic and resistive training based on initial evaluation findings, risk stratification, comorbidities and participant's personal goals.       Expected Outcomes Short Term: Attend rehab on a regular basis to increase amount of physical activity.;Long Term: Exercising regularly at least 3-5 days a week.;Long Term: Add in home exercise to make exercise part of routine and to increase amount of physical activity.       Increase Strength and Stamina Yes       Intervention Provide advice, education, support and counseling about physical activity/exercise needs.;Develop an individualized exercise prescription for aerobic and resistive training based on initial evaluation findings, risk stratification, comorbidities and participant's personal goals.       Expected Outcomes Short Term: Increase workloads from initial exercise prescription for resistance, speed, and METs.;Short Term: Perform resistance training exercises routinely during rehab and add in resistance training at home;Long Term: Improve cardiorespiratory fitness, muscular endurance and strength as measured by increased METs and functional capacity ( )       Able to understand and use rate of perceived exertion (RPE) scale Yes       Intervention Provide education and explanation on how to use RPE scale  Expected Outcomes Long Term:  Able to use RPE to guide intensity level  when exercising independently;Short Term: Able to use RPE daily in rehab to express subjective intensity level       Able to understand and use Dyspnea scale Yes       Intervention Provide education and explanation on how to use Dyspnea scale       Expected Outcomes Short Term: Able to use Dyspnea scale daily in rehab to express subjective sense of shortness of breath during exertion;Long Term: Able to use Dyspnea scale to guide intensity level when exercising independently       Knowledge and understanding of Target Heart Rate Range (THRR) Yes       Intervention Provide education and explanation of THRR including how the numbers were predicted and where they are located for reference       Expected Outcomes Short Term: Able to state/look up THRR;Long Term: Able to use THRR to govern intensity when exercising independently;Short Term: Able to use daily as guideline for intensity in rehab       Able to check pulse independently Yes       Intervention Provide education and demonstration on how to check pulse in carotid and radial arteries.;Review the importance of being able to check your own pulse for safety during independent exercise       Expected Outcomes Long Term: Able to check pulse independently and accurately;Short Term: Able to explain why pulse checking is important during independent exercise       Understanding of Exercise Prescription Yes       Intervention Provide education, explanation, and written materials on patient's individual exercise prescription       Expected Outcomes Long Term: Able to explain home exercise prescription to exercise independently;Short Term: Able to explain program exercise prescription          Exercise Goals Re-Evaluation :   Discharge Exercise Prescription (Final Exercise Prescription Changes):  Exercise Prescription Changes - 03/06/24 1500       Response to Exercise   Blood Pressure (Admit) 100/62    Blood Pressure (Exercise) 148/70    Blood  Pressure (Exit) 126/64    Heart Rate (Admit) 82 bpm    Heart Rate (Exercise) 96 bpm    Heart Rate (Exit) 92 bpm    Oxygen  Saturation (Admit) 94 %    Oxygen  Saturation (Exercise) 75 %    Oxygen  Saturation (Exit) 91 %    Rating of Perceived Exertion (Exercise) 17    Perceived Dyspnea (Exercise) 3    Symptoms SOb, destaruation    Comments walk test results          Nutrition:  Target Goals: Understanding of nutrition guidelines, daily intake of sodium 1500mg , cholesterol 200mg , calories 30% from fat and 7% or less from saturated fats, daily to have 5 or more servings of fruits and vegetables.  Education: Nutrition 1 -Group instruction provided by verbal, written material, interactive activities, discussions, models, and posters to present general guidelines for heart healthy nutrition including macronutrients, label reading, and promoting whole foods over processed counterparts. Education serves as pensions consultant of discussion of heart healthy eating for all. Written material provided at class time.     Education: Nutrition 2 -Group instruction provided by verbal, written material, interactive activities, discussions, models, and posters to present general guidelines for heart healthy nutrition including sodium, cholesterol, and saturated fat. Providing guidance of habit forming to improve blood pressure, cholesterol, and body weight. Written material provided at  class time.     Biometrics:  Pre Biometrics - 03/06/24 1525       Pre Biometrics   Height 5' 7 (1.702 m)    Weight 56.2 kg    Waist Circumference 29.5 inches    Hip Circumference 36 inches    Waist to Hip Ratio 0.82 %    BMI (Calculated) 19.4    Grip Strength 9.8 kg    Single Leg Stand 1.2 seconds           Nutrition Therapy Plan and Nutrition Goals:  Nutrition Therapy & Goals - 03/06/24 1526       Intervention Plan   Intervention Prescribe, educate and counsel regarding individualized specific dietary  modifications aiming towards targeted core components such as weight, hypertension, lipid management, diabetes, heart failure and other comorbidities.;Nutrition handout(s) given to patient.    Expected Outcomes Short Term Goal: Understand basic principles of dietary content, such as calories, fat, sodium, cholesterol and nutrients.;Long Term Goal: Adherence to prescribed nutrition plan.          Nutrition Assessments:  MEDIFICTS Score Key: >=70 Need to make dietary changes  40-70 Heart Healthy Diet <= 40 Therapeutic Level Cholesterol Diet   Picture Your Plate Scores: <59 Unhealthy dietary pattern with much room for improvement. 41-50 Dietary pattern unlikely to meet recommendations for good health and room for improvement. 51-60 More healthful dietary pattern, with some room for improvement.  >60 Healthy dietary pattern, although there may be some specific behaviors that could be improved.   Nutrition Goals Re-Evaluation:   Nutrition Goals Discharge (Final Nutrition Goals Re-Evaluation):   Psychosocial: Target Goals: Acknowledge presence or absence of significant depression and/or stress, maximize coping skills, provide positive support system. Participant is able to verbalize types and ability to use techniques and skills needed for reducing stress and depression.   Education: Stress, Anxiety, and Depression - Group verbal and visual presentation to define topics covered.  Reviews how body is impacted by stress, anxiety, and depression.  Also discusses healthy ways to reduce stress and to treat/manage anxiety and depression.  Written material provided at class time.   Education: Sleep Hygiene -Provides group verbal and written instruction about how sleep can affect your health.  Define sleep hygiene, discuss sleep cycles and impact of sleep habits. Review good sleep hygiene tips.    Initial Review & Psychosocial Screening:  Initial Psych Review & Screening - 02/23/24 1334        Initial Review   Current issues with None Identified      Family Dynamics   Good Support System? Yes    Comments Patient has a large extended family that supports her.      Barriers   Psychosocial barriers to participate in program The patient should benefit from training in stress management and relaxation.;There are no identifiable barriers or psychosocial needs.      Screening Interventions   Interventions Encouraged to exercise;To provide support and resources with identified psychosocial needs;Provide feedback about the scores to participant    Expected Outcomes Short Term goal: Utilizing psychosocial counselor, staff and physician to assist with identification of specific Stressors or current issues interfering with healing process. Setting desired goal for each stressor or current issue identified.;Long Term Goal: Stressors or current issues are controlled or eliminated.;Short Term goal: Identification and review with participant of any Quality of Life or Depression concerns found by scoring the questionnaire.;Long Term goal: The participant improves quality of Life and PHQ9 Scores as seen by post scores  and/or verbalization of changes          Quality of Life Scores:  Scores of 19 and below usually indicate a poorer quality of life in these areas.  A difference of  2-3 points is a clinically meaningful difference.  A difference of 2-3 points in the total score of the Quality of Life Index has been associated with significant improvement in overall quality of life, self-image, physical symptoms, and general health in studies assessing change in quality of life.  PHQ-9: Review Flowsheet       03/06/2024 01/19/2014 01/05/2014 05/18/2013  Depression screen PHQ 2/9  Decreased Interest 0 0 0 0  Down, Depressed, Hopeless 0 0 0 0  PHQ - 2 Score 0 0 0 0  Altered sleeping 0 - - -  Tired, decreased energy 3 - - -  Change in appetite 3 - - -  Feeling bad or failure about yourself   0 - - -  Trouble concentrating 0 - - -  Moving slowly or fidgety/restless 0 - - -  Suicidal thoughts 0 - - -  PHQ-9 Score 6 - - -  Difficult doing work/chores Somewhat difficult - - -   Interpretation of Total Score  Total Score Depression Severity:  1-4 = Minimal depression, 5-9 = Mild depression, 10-14 = Moderate depression, 15-19 = Moderately severe depression, 20-27 = Severe depression   Psychosocial Evaluation and Intervention:  Psychosocial Evaluation - 02/23/24 1335       Psychosocial Evaluation & Interventions   Interventions Stress management education;Relaxation education;Encouraged to exercise with the program and follow exercise prescription    Comments Patient was referred to [facility] with chronic respiratory failure with hypoxia; pulmonary HTN and ILD. She denies any depression or anxiety. She says she sleeps well at night using Alprazalam. Her grandson lives in her basement but she says she rarely sees him, but he is there if she needs him. 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 She is looking forward to participating in the program. Her goals are to get stronger in her legs and feet and to improve her confidence in her ability to do things. She has no barriers identified to complete the program.    Expected Outcomes Short Term: Patient will start the program and attend consistently. Long Term: Patient will complete the program meeting personal goals.    Continue Psychosocial Services  Follow up required by staff          Psychosocial Re-Evaluation:   Psychosocial Discharge (Final Psychosocial Re-Evaluation):   Education: Education Goals: Education classes will be provided on a weekly basis, covering required topics. Participant will state understanding/return demonstration of topics presented.  Learning Barriers/Preferences:  Learning  Barriers/Preferences - 02/23/24 1318       Learning Barriers/Preferences   Learning Barriers None    Learning Preferences Audio;Skilled Demonstration          General Pulmonary Education Topics:  Infection Prevention: - Provides verbal and written material to individual with discussion of infection control including proper hand washing and proper equipment cleaning during exercise session.   Falls Prevention: - Provides verbal and written material to individual with discussion of falls prevention and safety.   Chronic Lung Disease Review: - Group verbal instruction with posters, models, PowerPoint presentations and videos,  to review new updates, new respiratory medications, new advancements in procedures  and treatments. Providing information on websites and 800 numbers for continued self-education. Includes information about supplement oxygen , available portable oxygen  systems, continuous and intermittent flow rates, oxygen  safety, concentrators, and Medicare reimbursement for oxygen . Explanation of Pulmonary Drugs, including class, frequency, complications, importance of spacers, rinsing mouth after steroid MDI's, and proper cleaning methods for nebulizers. Review of basic lung anatomy and physiology related to function, structure, and complications of lung disease. Review of risk factors. Discussion about methods for diagnosing sleep apnea and types of masks and machines for OSA. Includes a review of the use of types of environmental controls: home humidity, furnaces, filters, dust mite/pet prevention, HEPA vacuums. Discussion about weather changes, air quality and the benefits of nasal washing. Instruction on Warning signs, infection symptoms, calling MD promptly, preventive modes, and value of vaccinations. Review of effective airway clearance, coughing and/or vibration techniques. Emphasizing that all should Create an Action Plan. Written material provided at class  time.   AED/CPR: - Group verbal and written instruction with the use of models to demonstrate the basic use of the AED with the basic ABC's of resuscitation.    Tests and Procedures:  - Group verbal and visual presentation and models provide information about basic cardiac anatomy and function. Reviews the testing methods done to diagnose heart disease and the outcomes of the test results. Describes the treatment choices: Medical Management, Angioplasty, or Coronary Bypass Surgery for treating various heart conditions including Myocardial Infarction, Angina, Valve Disease, and Cardiac Arrhythmias.  Written material provided at class time.   Medication Safety: - Group verbal and visual instruction to review commonly prescribed medications for heart and lung disease. Reviews the medication, class of the drug, and side effects. Includes the steps to properly store meds and maintain the prescription regimen.  Written material given at graduation.   Other: -Provides group and verbal instruction on various topics (see comments)   Knowledge Questionnaire Score:  Knowledge Questionnaire Score - 03/06/24 1427       Knowledge Questionnaire Score   Pre Score 15/18           Core Components/Risk Factors/Patient Goals at Admission:  Personal Goals and Risk Factors at Admission - 03/06/24 1526       Core Components/Risk Factors/Patient Goals on Admission    Weight Management Weight Maintenance;Yes;Weight Gain    Intervention Weight Management: Develop a combined nutrition and exercise program designed to reach desired caloric intake, while maintaining appropriate intake of nutrient and fiber, sodium and fats, and appropriate energy expenditure required for the weight goal.;Weight Management: Provide education and appropriate resources to help participant work on and attain dietary goals.    Admit Weight 123 lb 14.4 oz (56.2 kg)    Goal Weight: Short Term 125 lb (56.7 kg)    Goal Weight:  Long Term 125 lb (56.7 kg)    Expected Outcomes Short Term: Continue to assess and modify interventions until short term weight is achieved;Long Term: Adherence to nutrition and physical activity/exercise program aimed toward attainment of established weight goal;Weight Maintenance: Understanding of the daily nutrition guidelines, which includes 25-35% calories from fat, 7% or less cal from saturated fats, less than 200mg  cholesterol, less than 1.5gm of sodium, & 5 or more servings of fruits and vegetables daily;Weight Gain: Understanding of general recommendations for a high calorie, high protein meal plan that promotes weight gain by distributing calorie intake throughout the day with the consumption for 4-5 meals, snacks, and/or supplements    Improve shortness of breath with ADL's Yes  Intervention Provide education, individualized exercise plan and daily activity instruction to help decrease symptoms of SOB with activities of daily living.    Expected Outcomes Short Term: Improve cardiorespiratory fitness to achieve a reduction of symptoms when performing ADLs;Long Term: Be able to perform more ADLs without symptoms or delay the onset of symptoms    Increase knowledge of respiratory medications and ability to use respiratory devices properly  Yes    Intervention Provide education and demonstration as needed of appropriate use of medications, inhalers, and oxygen  therapy.    Expected Outcomes Short Term: Achieves understanding of medications use. Understands that oxygen  is a medication prescribed by physician. Demonstrates appropriate use of inhaler and oxygen  therapy.;Long Term: Maintain appropriate use of medications, inhalers, and oxygen  therapy.    Hypertension Yes    Intervention Provide education on lifestyle modifcations including regular physical activity/exercise, weight management, moderate sodium restriction and increased consumption of fresh fruit, vegetables, and low fat dairy, alcohol   moderation, and smoking cessation.;Monitor prescription use compliance.    Expected Outcomes Short Term: Continued assessment and intervention until BP is < 140/92mm HG in hypertensive participants. < 130/9mm HG in hypertensive participants with diabetes, heart failure or chronic kidney disease.;Long Term: Maintenance of blood pressure at goal levels.          Education:Diabetes - Individual verbal and written instruction to review signs/symptoms of diabetes, desired ranges of glucose level fasting, after meals and with exercise. Acknowledge that pre and post exercise glucose checks will be done for 3 sessions at entry of program.   Know Your Numbers and Heart Failure: - Group verbal and visual instruction to discuss disease risk factors for cardiac and pulmonary disease and treatment options.  Reviews associated critical values for Overweight/Obesity, Hypertension, Cholesterol, and Diabetes.  Discusses basics of heart failure: signs/symptoms and treatments.  Introduces Heart Failure Zone chart for action plan for heart failure. Written material provided at class time.   Core Components/Risk Factors/Patient Goals Review:    Core Components/Risk Factors/Patient Goals at Discharge (Final Review):    ITP Comments:  ITP Comments     Row Name 02/23/24 1341 03/06/24 1425         ITP Comments Virtual orientation visit completed for pulmonary rehab with chronic respiratory failure/pulmonary HTN and ILD. On-site orientation visit scheduled for 02/28/24 at 2:15. Patient attend orientation today.  Patient is attending Pulmonary Rehabilitation Program.  Documentation for diagnosis can be found in [facility].  Reviewed medical chart, RPE/RPD, gym safety, and program guidelines.  Patient was fitted to equipment they will be using during rehab.  Patient is scheduled to start exercise on 03/13/24.   Initial ITP created and sent for review and signature by Dr. Anton Kelp, Medical Director for Pulmonary  Rehabilitation Program.         Comments: Initial ITP.

## 2024-03-06 NOTE — Patient Instructions (Signed)
 Patient Instructions  Patient Details  Name: Cindy [name] MRN: [id number] Date of Birth: [birth year] Referring Provider:  Catherine Cools, MD  Below are your personal goals for exercise, nutrition, and risk factors. Our goal is to help you stay on track towards obtaining and maintaining these goals. We will be discussing your progress on these goals with you throughout the program.  Initial Exercise Prescription:  Initial Exercise Prescription - 03/06/24 1500       Date of Initial Exercise RX and Referring Provider   Date 03/06/24    Referring Provider Algharnim, Fahid MD      Oxygen    Oxygen  Continuous    Liters 2    Maintain Oxygen  Saturation 88% or higher      NuStep   Level 1    SPM 60    Minutes 15    METs 1.5      REL-XR   Level 1    Speed 50    Minutes 15    METs 1.5      Prescription Details   Frequency (times per week) 2    Duration Progress to 30 minutes of continuous aerobic without signs/symptoms of physical distress      Intensity   THRR 40-80% of Max Heartrate 105-122    Ratings of Perceived Exertion 11-13    Perceived Dyspnea 0-4      Progression   Progression Continue to progress workloads to maintain intensity without signs/symptoms of physical distress.      Resistance Training   Training Prescription Yes    Weight 3 lb    Reps 10-15          Exercise Goals: Frequency: Be able to perform aerobic exercise two to three times per week in program working toward 2-5 days per week of home exercise.  Intensity: Work with a perceived exertion of 11 (fairly light) - 15 (hard) while following your exercise prescription.  We will make changes to your prescription with you as you progress through the program.   Duration: Be able to do 30 to 45 minutes of continuous aerobic exercise in addition to a 5 minute warm-up and a 5 minute cool-down routine.   Nutrition Goals: Your personal nutrition goals will be established when you do your nutrition  analysis with the dietician.  The following are general nutrition guidelines to follow: Cholesterol < 200mg /day Sodium < 1500mg /day Fiber: Women over 50 yrs - 21 grams per day  Personal Goals:  Personal Goals and Risk Factors at Admission - 03/06/24 1526       Core Components/Risk Factors/Patient Goals on Admission    Weight Management Weight Maintenance;Yes;Weight Gain    Intervention Weight Management: Develop a combined nutrition and exercise program designed to reach desired caloric intake, while maintaining appropriate intake of nutrient and fiber, sodium and fats, and appropriate energy expenditure required for the weight goal.;Weight Management: Provide education and appropriate resources to help participant work on and attain dietary goals.    Admit Weight 123 lb 14.4 oz (56.2 kg)    Goal Weight: Short Term 125 lb (56.7 kg)    Goal Weight: Long Term 125 lb (56.7 kg)    Expected Outcomes Short Term: Continue to assess and modify interventions until short term weight is achieved;Long Term: Adherence to nutrition and physical activity/exercise program aimed toward attainment of established weight goal;Weight Maintenance: Understanding of the daily nutrition guidelines, which includes 25-35% calories from fat, 7% or less cal from saturated fats, less than 200mg  cholesterol, less  than 1.5gm of sodium, & 5 or more servings of fruits and vegetables daily;Weight Gain: Understanding of general recommendations for a high calorie, high protein meal plan that promotes weight gain by distributing calorie intake throughout the day with the consumption for 4-5 meals, snacks, and/or supplements    Improve shortness of breath with ADL's Yes    Intervention Provide education, individualized exercise plan and daily activity instruction to help decrease symptoms of SOB with activities of daily living.    Expected Outcomes Short Term: Improve cardiorespiratory fitness to achieve a reduction of symptoms when  performing ADLs;Long Term: Be able to perform more ADLs without symptoms or delay the onset of symptoms    Increase knowledge of respiratory medications and ability to use respiratory devices properly  Yes    Intervention Provide education and demonstration as needed of appropriate use of medications, inhalers, and oxygen  therapy.    Expected Outcomes Short Term: Achieves understanding of medications use. Understands that oxygen  is a medication prescribed by physician. Demonstrates appropriate use of inhaler and oxygen  therapy.;Long Term: Maintain appropriate use of medications, inhalers, and oxygen  therapy.    Hypertension Yes    Intervention Provide education on lifestyle modifcations including regular physical activity/exercise, weight management, moderate sodium restriction and increased consumption of fresh fruit, vegetables, and low fat dairy, alcohol  moderation, and smoking cessation.;Monitor prescription use compliance.    Expected Outcomes Short Term: Continued assessment and intervention until BP is < 140/52mm HG in hypertensive participants. < 130/20mm HG in hypertensive participants with diabetes, heart failure or chronic kidney disease.;Long Term: Maintenance of blood pressure at goal levels.          Tobacco Use Initial Evaluation: Social History   Tobacco Use  Smoking Status Never   Passive exposure: Never  Smokeless Tobacco Never    Exercise Goals and Review:  Exercise Goals     Row Name 03/06/24 1524             Exercise Goals   Increase Physical Activity Yes       Intervention Provide advice, education, support and counseling about physical activity/exercise needs.;Develop an individualized exercise prescription for aerobic and resistive training based on initial evaluation findings, risk stratification, comorbidities and participant's personal goals.       Expected Outcomes Short Term: Attend rehab on a regular basis to increase amount of physical activity.;Long  Term: Exercising regularly at least 3-5 days a week.;Long Term: Add in home exercise to make exercise part of routine and to increase amount of physical activity.       Increase Strength and Stamina Yes       Intervention Provide advice, education, support and counseling about physical activity/exercise needs.;Develop an individualized exercise prescription for aerobic and resistive training based on initial evaluation findings, risk stratification, comorbidities and participant's personal goals.       Expected Outcomes Short Term: Increase workloads from initial exercise prescription for resistance, speed, and METs.;Short Term: Perform resistance training exercises routinely during rehab and add in resistance training at home;Long Term: Improve cardiorespiratory fitness, muscular endurance and strength as measured by increased METs and functional capacity ( )       Able to understand and use rate of perceived exertion (RPE) scale Yes       Intervention Provide education and explanation on how to use RPE scale       Expected Outcomes Long Term:  Able to use RPE to guide intensity level when exercising independently;Short Term: Able to use RPE daily in rehab  to express subjective intensity level       Able to understand and use Dyspnea scale Yes       Intervention Provide education and explanation on how to use Dyspnea scale       Expected Outcomes Short Term: Able to use Dyspnea scale daily in rehab to express subjective sense of shortness of breath during exertion;Long Term: Able to use Dyspnea scale to guide intensity level when exercising independently       Knowledge and understanding of Target Heart Rate Range (THRR) Yes       Intervention Provide education and explanation of THRR including how the numbers were predicted and where they are located for reference       Expected Outcomes Short Term: Able to state/look up THRR;Long Term: Able to use THRR to govern intensity when exercising  independently;Short Term: Able to use daily as guideline for intensity in rehab       Able to check pulse independently Yes       Intervention Provide education and demonstration on how to check pulse in carotid and radial arteries.;Review the importance of being able to check your own pulse for safety during independent exercise       Expected Outcomes Long Term: Able to check pulse independently and accurately;Short Term: Able to explain why pulse checking is important during independent exercise       Understanding of Exercise Prescription Yes       Intervention Provide education, explanation, and written materials on patient's individual exercise prescription       Expected Outcomes Long Term: Able to explain home exercise prescription to exercise independently;Short Term: Able to explain program exercise prescription        Copy of goals given to participant.

## 2024-03-07 ENCOUNTER — Encounter: Payer: Self-pay | Admitting: Internal Medicine

## 2024-03-07 ENCOUNTER — Ambulatory Visit: Attending: Internal Medicine | Admitting: Internal Medicine

## 2024-03-07 VITALS — BP 124/82 | HR 80 | Ht 67.0 in | Wt 121.4 lb

## 2024-03-07 DIAGNOSIS — I43 Cardiomyopathy in diseases classified elsewhere: Secondary | ICD-10-CM | POA: Insufficient documentation

## 2024-03-07 DIAGNOSIS — I4819 Other persistent atrial fibrillation: Secondary | ICD-10-CM | POA: Diagnosis not present

## 2024-03-07 DIAGNOSIS — E854 Organ-limited amyloidosis: Secondary | ICD-10-CM | POA: Diagnosis not present

## 2024-03-07 MED ORDER — TORSEMIDE 20 MG PO TABS: ORAL_TABLET | ORAL | Status: AC

## 2024-03-07 NOTE — Progress Notes (Signed)
 Cardiology Office Note  Date: 03/07/2024   ID: Cindy [name], DOB 1932-06-22, MRN [id number]  PCP:  Shona Norleen PEDLAR, MD  Cardiologist:  Diannah SHAUNNA Maywood, MD Electrophysiologist:  None   History of Present Illness: Cindy [name] is a 89 y.o. female known to have cardiac AL amyloidosis (she received NEOD001 as part of the VITAL trial with subsequent isatuximab in clinical trial after recurrence of AL amyloidosis, now in remission), amyloid cardiomyopathy/restrictive cardiomyopathy/chronic diastolic heart failure, persistent A-fib s/p ablation and DCCV in 2016, AV block s/p [employer] dual-chamber PPM in 2018, pulmonary embolism, RV enlargement, ILD on home oxygen  is here for follow-up visit. Follows up with hematology for AL amyloidosis at [facility], Dr Volanda.  Patient was volume overloaded when she had ER visit in May 2025 but diuretics dose was not increased at that time.  When she saw me as a new patient visit in May 2025, I did not increase her diuretic dose, instead I started her on Farxiga  10 mg once daily.  She reported improvement in her shortness of breath after adding Farxiga .  She says she is not back to baseline but she feels better and doing her daily activities.  But in the last couple of months, her diuretic dose was uptitrated due to volume overload.  Previously was taking torsemide  30 mg once daily.  Currently taking torsemide  40 mg daily alternating with 20 mg.  He is doing good.  DOE improved and has baseline shortness of breath now.  No angina, dizziness, syncope.  Leg swelling improved.  She takes injections in her eye for macular degeneration.  She sees pulm at [facility].  She follows up with oncology for the management of AL amyloidosis at [facility] (she said his oncologist switched from [facility] to [facility]).  Past Medical History:  Diagnosis Date   Amyloidosis (HCC)    cancer   Cancer (HCC)    CHF (congestive heart failure) (HCC)    Amyloidosis   Diarrhea     Diverticulosis of colon (without mention of hemorrhage)    Edema    Esophagitis, unspecified    Family history of malignant neoplasm of gastrointestinal tract    Fibrosis of lung (HCC) 03/14/2013   Flatulence, eructation, and gas pain    GERD (gastroesophageal reflux disease)    Left sided ulcerative colitis (HCC)    Macular degeneration    left eye   Maxillary sinus mass    Osteoporosis, unspecified    Other and unspecified hyperlipidemia    Other and unspecified noninfectious gastroenteritis and colitis(558.9)    Paroxysmal atrial fibrillation (HCC) 07/09/2019   Peripheral vascular disease    Pneumonia    Stricture and stenosis of esophagus    Unspecified hypothyroidism    URI (upper respiratory infection)     Past Surgical History:  Procedure Laterality Date   ABDOMINAL HYSTERECTOMY     CATARACT EXTRACTION W/PHACO Bilateral    Dr. Roz   CHOLECYSTECTOMY     THYROIDECTOMY, PARTIAL     YAG LASER APPLICATION Right 12/12/2013   Procedure: YAG LASER APPLICATION;  Surgeon: Oneil T. Roz, MD;  Location: [facility] ORS;  Service: Ophthalmology;  Laterality: Right;    Current Outpatient Medications  Medication Sig Dispense Refill   acetaminophen  (TYLENOL ) 650 MG CR tablet Take 650 mg by mouth every 8 (eight) hours as needed for pain or fever.     albuterol  (PROVENTIL ) (2.5 MG/3ML) 0.083% nebulizer solution Take 3 mLs (2.5 mg total) by nebulization every 6 (six)  hours as needed for wheezing or shortness of breath. 75 mL 5   albuterol  (PROVENTIL ) (2.5 MG/3ML) 0.083% nebulizer solution Take 3 mLs (2.5 mg total) by nebulization every 6 (six) hours as needed for wheezing or shortness of breath. 2-3 times a week 75 mL 0   ALPRAZolam  (XANAX ) 0.5 MG tablet Take 1 tablet (0.5 mg total) by mouth at bedtime as needed for anxiety. 30 tablet 3   amoxicillin (AMOXIL) 875 MG tablet Take 875 mg by mouth 2 (two) times daily.     apixaban  (ELIQUIS ) 2.5 MG TABS tablet Take 1 tablet (2.5 mg total) by  mouth 2 (two) times daily. 180 tablet 1   BIOTIN PO Take 1 tablet by mouth daily.     Cholecalciferol  50 MCG (2000 UT) CAPS Take 1 capsule by mouth daily.     clobetasol (TEMOVATE) 0.05 % external solution Apply 1 Application topically 2 (two) times daily.     cyanocobalamin (VITAMIN B12) 1000 MCG tablet Take 1,000 mcg by mouth daily.     FARXIGA  10 MG TABS tablet TAKE 1 TABLET BY MOUTH DAILY 90 tablet 3   fentaNYL  (DURAGESIC ) 25 MCG/HR Place 1 patch onto the skin every 3 (three) days.     Fluocinolone Acetonide 0.01 % OIL Place 5 drops into both ears as needed (itching). As needed for itching     levothyroxine  (SYNTHROID ) 137 MCG tablet Take 137 mcg by mouth daily.     metoprolol  succinate (TOPROL -XL) 25 MG 24 hr tablet Take 0.5-1 tablets (12.5-25 mg total) by mouth in the morning and at bedtime. Patient takes 1 tablet (25mg ) in the morning and 1/2 of a tablet (12.5mg ) in the evening (Patient taking differently: Take 25 mg by mouth daily. Takes 1/2)     Multiple Vitamins-Minerals (PRESERVISION AREDS PO) Take 1 capsule by mouth daily.     NYAMYC powder Apply 1 Application topically 2 (two) times daily.     ondansetron  (ZOFRAN ) 4 MG tablet Take 4 mg by mouth every 8 (eight) hours as needed for nausea or vomiting.     Polyethyl Glycol-Propyl Glycol (SYSTANE OP) Apply 1 drop to eye daily.     potassium chloride  (KLOR-CON  M) 10 MEQ tablet Take 10 mEq by mouth daily.     Probiotic Product (ALIGN) 4 MG CAPS Take 1 tablet by mouth every morning.     senna (SENOKOT) 8.6 MG tablet Take 2 tablets by mouth as needed for constipation.     sodium chloride  (OCEAN) 0.65 % nasal spray Place 2 sprays into the nose daily as needed for congestion.     torsemide  (DEMADEX ) 20 MG tablet Take 1 tablet (20 mg total) by mouth daily.     No current facility-administered medications for this visit.   Allergies:  Nebivolol hcl, Codeine, Sulfa antibiotics, and Sulfonamide derivatives   Social History: The patient   reports that she has never smoked. She has never been exposed to tobacco smoke. She has never used smokeless tobacco. She reports that she does not drink alcohol  and does not use drugs.   Family History: The patient's family history includes Adrenal disorder in her son; Colon cancer in her mother; Diabetes in her son; Heart attack in her father; Heart disease in her brother; Kidney cancer in her son; Seizures in her mother; Stomach cancer in her sister.   ROS:  Please see the history of present illness. Otherwise, complete review of systems is positive for none  All other systems are reviewed and negative.  Physical Exam: VS:  BP 124/82   Pulse 80   Ht 5' 7 (1.702 m)   Wt 121 lb 6.4 oz (55.1 kg)   SpO2 100%   BMI 19.01 kg/m , BMI Body mass index is 19.01 kg/m.  Wt Readings from Last 3 Encounters:  03/07/24 121 lb 6.4 oz (55.1 kg)  03/06/24 123 lb 14.4 oz (56.2 kg)  02/17/24 122 lb (55.3 kg)    General: Patient appears comfortable at rest. HEENT: Conjunctiva and lids normal, oropharynx clear with moist mucosa. Neck: Supple, no elevated JVP or carotid bruits, no thyromegaly. Lungs: Clear to auscultation, nonlabored breathing at rest. Cardiac: Regular rate and rhythm, no S3 or significant systolic murmur, no pericardial rub. Abdomen: Soft, nontender, no hepatomegaly, bowel sounds present, no guarding or rebound. Extremities: 1+ pitting edema Skin: Warm and dry. Musculoskeletal: No kyphosis. Neuropsychiatric: Alert and oriented x3, affect grossly appropriate.  Recent Labwork: 09/02/2023: ALT 11; AST 23; B Natriuretic Peptide 231.0; Hemoglobin 13.9; Platelets 191 09/15/2023: BUN 20; Creatinine, Ser 0.79; Potassium 4.7; Sodium 140     Component Value Date/Time   CHOL 201 (H) 04/06/2014 0912   TRIG 73.0 04/06/2014 0912   HDL 39.30 04/06/2014 0912   CHOLHDL 5 04/06/2014 0912   VLDL 14.6 04/06/2014 0912   LDLCALC 147 (H) 04/06/2014 0912    Assessment and Plan:  Chronic diastolic  heart failure: Previous outpatient diuretic escalation by pulmonology recently due to volume overload.  Currently taking torsemide  40 mg daily alternating with 20 mg (previously taking 30 mg daily and when she was following with [facility], she was taking torsemide  20 mg every other day).  Doing well on this regimen.  Continue current diuretics and Farxiga  10 mg once daily.  Continue metoprolol  succinate 12.5 mg once daily.  She has AL amyloidosis and restrictive cardiomyopathy.  Echocardiogram in May 2024 showed normal LV function, RV enlargement with decreased RV systolic function, moderate pulmonary hypertension and no valvular heart disease.  Will refer to advanced heart failure as she has amyloid cardiomyopathy/restrictive cardiomyopathy to see if she will benefit from any additional therapy.  AL amyloidosis: Follows with oncology at [facility].  Persistent atrial fibrillation s/p DCCV and ablation in 2016: EKG shows atrial paced rhythm, continue metoprolol  succinate 12.5 mg once daily and Eliquis  2.5 mg twice daily.  AV block s/p [employer] dual chamber PPM in 2019: Remote device interrogation reports from [place].  Patient states she is not following up with [facility] cardiology anymore.  Will refer her to electrophysiology at [facility] to establish care for device checks.  Pulmonary hypertension: History of recurrent pulmonary embolisms.  Likely WHO group 2 and 3.  On diuretics, as above and on chronic home oxygen  therapy.  Chronic hypoxic respiratory failure: On Home oxygen    30-minute spent in reviewing prior medical records, reports, more than 3 labs, discussion and documentation.   Medication Adjustments/Labs and Tests Ordered: Current medicines are reviewed at length with the patient today.  Concerns regarding medicines are outlined above.    Disposition:  Follow up 6 months  Signed Anae Hams Priya Arif Amendola, MD, 03/07/2024 11:36 AM    [facility] at [facility] [address]  [place], [place], [place] [address]

## 2024-03-07 NOTE — Patient Instructions (Signed)
 Medication Instructions:  Your physician recommends that you continue on your current medications as directed. Please refer to the Current Medication list given to you today.  *If you need a refill on your cardiac medications before your next appointment, please call your pharmacy*  Lab Work: None If you have labs (blood work) drawn today and your tests are completely normal, you will receive your results only by: MyChart Message (if you have MyChart) OR A paper copy in the mail If you have any lab test that is abnormal or we need to change your treatment, we will call you to review the results.  Testing/Procedures: None  Follow-Up: At [facility], you and your health needs are our priority.  As part of our continuing mission to provide you with exceptional heart care, our providers are all part of one team.  This team includes your primary Cardiologist (physician) and Advanced Practice Providers or APPs (Physician Assistants and Nurse Practitioners) who all work together to provide you with the care you need, when you need it.  Your next appointment:   6 month(s)  Provider:   You may see Vishnu P Mallipeddi, MD or one of the following Advanced Practice Providers on your designated Care Team:   [place], PA-C  [place], [place] Jacolyn Reedy, [place]     We recommend signing up for the patient portal called "MyChart".  Sign up information is provided on this After Visit Summary.  MyChart is used to connect with patients for Virtual Visits (Telemedicine).  Patients are able to view lab/test results, encounter notes, upcoming appointments, etc.  Non-urgent messages can be sent to your provider as well.   To learn more about what you can do with MyChart, go to [email or web address].   Other Instructions

## 2024-03-10 DIAGNOSIS — D8989 Other specified disorders involving the immune mechanism, not elsewhere classified: Secondary | ICD-10-CM | POA: Diagnosis not present

## 2024-03-10 DIAGNOSIS — J849 Interstitial pulmonary disease, unspecified: Secondary | ICD-10-CM | POA: Diagnosis not present

## 2024-03-10 DIAGNOSIS — I4891 Unspecified atrial fibrillation: Secondary | ICD-10-CM | POA: Diagnosis not present

## 2024-03-10 DIAGNOSIS — I272 Pulmonary hypertension, unspecified: Secondary | ICD-10-CM | POA: Diagnosis not present

## 2024-03-10 DIAGNOSIS — F411 Generalized anxiety disorder: Secondary | ICD-10-CM | POA: Diagnosis not present

## 2024-03-10 DIAGNOSIS — I509 Heart failure, unspecified: Secondary | ICD-10-CM | POA: Diagnosis not present

## 2024-03-10 DIAGNOSIS — G894 Chronic pain syndrome: Secondary | ICD-10-CM | POA: Diagnosis not present

## 2024-03-10 DIAGNOSIS — K5909 Other constipation: Secondary | ICD-10-CM | POA: Diagnosis not present

## 2024-03-10 DIAGNOSIS — R0982 Postnasal drip: Secondary | ICD-10-CM | POA: Diagnosis not present

## 2024-03-10 DIAGNOSIS — I2699 Other pulmonary embolism without acute cor pulmonale: Secondary | ICD-10-CM | POA: Diagnosis not present

## 2024-03-10 DIAGNOSIS — K219 Gastro-esophageal reflux disease without esophagitis: Secondary | ICD-10-CM | POA: Diagnosis not present

## 2024-03-10 DIAGNOSIS — E039 Hypothyroidism, unspecified: Secondary | ICD-10-CM | POA: Diagnosis not present

## 2024-03-13 ENCOUNTER — Encounter (HOSPITAL_COMMUNITY)
Admission: RE | Admit: 2024-03-13 | Discharge: 2024-03-13 | Disposition: A | Discharge status: Home / Self Care | Source: Ambulatory Visit | Attending: Pulmonary Disease | Admitting: Pulmonary Disease

## 2024-03-13 DIAGNOSIS — H353123 Nonexudative age-related macular degeneration, left eye, advanced atrophic without subfoveal involvement: Secondary | ICD-10-CM | POA: Diagnosis not present

## 2024-03-13 DIAGNOSIS — J9611 Chronic respiratory failure with hypoxia: Secondary | ICD-10-CM

## 2024-03-13 DIAGNOSIS — H353211 Exudative age-related macular degeneration, right eye, with active choroidal neovascularization: Secondary | ICD-10-CM | POA: Diagnosis not present

## 2024-03-13 DIAGNOSIS — I272 Pulmonary hypertension, unspecified: Secondary | ICD-10-CM

## 2024-03-13 DIAGNOSIS — H3321 Serous retinal detachment, right eye: Secondary | ICD-10-CM | POA: Diagnosis not present

## 2024-03-13 DIAGNOSIS — H43812 Vitreous degeneration, left eye: Secondary | ICD-10-CM | POA: Diagnosis not present

## 2024-03-13 DIAGNOSIS — J849 Interstitial pulmonary disease, unspecified: Secondary | ICD-10-CM

## 2024-03-13 DIAGNOSIS — H353114 Nonexudative age-related macular degeneration, right eye, advanced atrophic with subfoveal involvement: Secondary | ICD-10-CM | POA: Diagnosis not present

## 2024-03-13 DIAGNOSIS — H353221 Exudative age-related macular degeneration, left eye, with active choroidal neovascularization: Secondary | ICD-10-CM | POA: Diagnosis not present

## 2024-03-13 DIAGNOSIS — H35352 Cystoid macular degeneration, left eye: Secondary | ICD-10-CM | POA: Diagnosis not present

## 2024-03-13 NOTE — Progress Notes (Signed)
 Daily Session Note  Patient Details  Name: Cindy [name] MRN: [id number] Date of Birth: [birth year] Referring Provider:   Flowsheet Row PULMONARY REHAB OTHER RESP ORIENTATION from 03/06/2024 in [facility] CARDIAC REHABILITATION  Referring Provider Len Cools MD    Encounter Date: 03/13/2024  Check In:  Session Check In - 03/13/24 1108       Check-In   Supervising physician immediately available to respond to emergencies See telemetry face sheet for immediately available MD    Location [facility]-Cardiac & Pulmonary Rehab    Staff Present Powell Benders, BS, Exercise Physiologist;Brittany Jackquline, BSN, RN, Rosalba Gelineau, MA, RCEP, CCRP, CCET    Virtual Visit No    Medication changes reported     No    Fall or balance concerns reported    No    Tobacco Cessation No Change    Warm-up and Cool-down Performed on first and last piece of equipment    Resistance Training Performed Yes    VAD Patient? No    PAD/SET Patient? No      Pain Assessment   Currently in Pain? No/denies    Multiple Pain Sites No          Capillary Blood Glucose: No results found for this or any previous visit (from the past 24 hours).    Social History   Tobacco Use  Smoking Status Never   Passive exposure: Never  Smokeless Tobacco Never    Goals Met:  Independence with exercise equipment Exercise tolerated well No report of concerns or symptoms today Strength training completed today  Goals Unmet:  Not Applicable  Comments: First full day of exercise!  Patient was oriented to gym and equipment including functions, settings, policies, and procedures.  Patient's individual exercise prescription and treatment plan were reviewed.  All starting workloads were established based on the results of the 6 minute walk test done at initial orientation visit.  The plan for exercise progression was also introduced and progression will be customized based on patient's performance and  goals.

## 2024-03-15 ENCOUNTER — Ambulatory Visit: Admitting: Internal Medicine

## 2024-03-15 ENCOUNTER — Encounter (HOSPITAL_COMMUNITY)
Admission: RE | Admit: 2024-03-15 | Discharge: 2024-03-15 | Disposition: A | Discharge status: Home / Self Care | Source: Ambulatory Visit | Attending: Pulmonary Disease | Admitting: Pulmonary Disease

## 2024-03-15 DIAGNOSIS — J9611 Chronic respiratory failure with hypoxia: Secondary | ICD-10-CM | POA: Diagnosis not present

## 2024-03-15 DIAGNOSIS — J849 Interstitial pulmonary disease, unspecified: Secondary | ICD-10-CM

## 2024-03-15 DIAGNOSIS — I272 Pulmonary hypertension, unspecified: Secondary | ICD-10-CM | POA: Diagnosis not present

## 2024-03-15 NOTE — Progress Notes (Signed)
 Daily Session Note  Patient Details  Name: Cindy [name] MRN: [id number] Date of Birth: 1932-10-24 Referring Provider:   Flowsheet Row PULMONARY REHAB OTHER RESP ORIENTATION from 03/06/2024 in [facility] CARDIAC REHABILITATION  Referring Provider Len Cools MD    Encounter Date: 03/15/2024  Check In:  Session Check In - 03/15/24 1104       Check-In   Supervising physician immediately available to respond to emergencies See telemetry face sheet for immediately available MD    Location [facility]    Staff Present Powell Benders, BS, Exercise Physiologist;Ernesha Ramone Jackquline, BSN, RN, WTA-C;Victoria Zina, RN    Virtual Visit No    Medication changes reported     No    Fall or balance concerns reported    No    Tobacco Cessation No Change    Warm-up and Cool-down Performed on first and last piece of equipment    Resistance Training Performed Yes    VAD Patient? No    PAD/SET Patient? No      Pain Assessment   Currently in Pain? No/denies          Capillary Blood Glucose: No results found for this or any previous visit (from the past 24 hours).    Social History   Tobacco Use  Smoking Status Never   Passive exposure: Never  Smokeless Tobacco Never    Goals Met:  Proper associated with RPD/PD & O2 Sat Independence with exercise equipment Improved SOB with ADL's Using PLB without cueing & demonstrates good technique Exercise tolerated well No report of concerns or symptoms today Strength training completed today  Goals Unmet:  Not Applicable  Comments: Pt able to follow exercise prescription today without complaint.  Will continue to monitor for progression.

## 2024-03-19 DIAGNOSIS — E1122 Type 2 diabetes mellitus with diabetic chronic kidney disease: Secondary | ICD-10-CM | POA: Diagnosis not present

## 2024-03-19 DIAGNOSIS — I4891 Unspecified atrial fibrillation: Secondary | ICD-10-CM | POA: Diagnosis not present

## 2024-03-19 DIAGNOSIS — I509 Heart failure, unspecified: Secondary | ICD-10-CM | POA: Diagnosis not present

## 2024-03-19 DIAGNOSIS — E782 Mixed hyperlipidemia: Secondary | ICD-10-CM | POA: Diagnosis not present

## 2024-03-20 ENCOUNTER — Encounter (HOSPITAL_COMMUNITY)
Admission: RE | Admit: 2024-03-20 | Discharge: 2024-03-20 | Disposition: A | Source: Ambulatory Visit | Attending: Pulmonary Disease | Admitting: Pulmonary Disease

## 2024-03-20 DIAGNOSIS — J9611 Chronic respiratory failure with hypoxia: Secondary | ICD-10-CM | POA: Insufficient documentation

## 2024-03-20 DIAGNOSIS — J849 Interstitial pulmonary disease, unspecified: Secondary | ICD-10-CM | POA: Diagnosis present

## 2024-03-20 DIAGNOSIS — I272 Pulmonary hypertension, unspecified: Secondary | ICD-10-CM | POA: Insufficient documentation

## 2024-03-20 NOTE — Progress Notes (Addendum)
 Daily Session Note  Patient Details  Name: Cindy [name] MRN: [id number] Date of Birth: October 16, 1932 Referring Provider:   Flowsheet Row PULMONARY REHAB OTHER RESP ORIENTATION from 03/06/2024 in [facility] CARDIAC REHABILITATION  Referring Provider Len Cools MD    Encounter Date: 03/20/2024  Check In:  Session Check In - 03/20/24 1100       Check-In   Supervising physician immediately available to respond to emergencies See telemetry face sheet for immediately available MD    Location [facility]iac [facility]    Staff Present Richerd Buddle, RN;Hyman Crossan Vicci, RN, BSN;Heather Con, BS, Exercise Physiologist    Virtual Visit No    Medication changes reported     Yes    Comments MD decreased Eliquis  to 2.5 mg daily.    Fall or balance concerns reported    No    Warm-up and Cool-down Performed on first and last piece of equipment    Resistance Training Performed Yes    VAD Patient? No    PAD/SET Patient? No      Pain Assessment   Currently in Pain? No/denies    Multiple Pain Sites No          Capillary Blood Glucose: No results found for this or any previous visit (from the past 24 hours).    Social History   Tobacco Use  Smoking Status Never   Passive exposure: Never  Smokeless Tobacco Never    Goals Met:  Proper associated with RPD/PD & O2 Sat Independence with exercise equipment Using PLB without cueing & demonstrates good technique Exercise tolerated well No report of concerns or symptoms today Strength training completed today  Goals Unmet:  Not Applicable  Comments: Pt able to follow exercise prescription today without complaint.  Will continue to monitor for progression.

## 2024-03-21 ENCOUNTER — Ambulatory Visit
Attending: Student in an Organized Health Care Education/Training Program | Admitting: Student in an Organized Health Care Education/Training Program

## 2024-03-21 ENCOUNTER — Encounter (HOSPITAL_COMMUNITY): Payer: Self-pay | Admitting: *Deleted

## 2024-03-21 ENCOUNTER — Encounter: Payer: Self-pay | Admitting: Student in an Organized Health Care Education/Training Program

## 2024-03-21 VITALS — BP 112/60 | HR 92 | Ht 67.0 in | Wt 122.6 lb

## 2024-03-21 DIAGNOSIS — J849 Interstitial pulmonary disease, unspecified: Secondary | ICD-10-CM

## 2024-03-21 DIAGNOSIS — I480 Paroxysmal atrial fibrillation: Secondary | ICD-10-CM | POA: Diagnosis not present

## 2024-03-21 DIAGNOSIS — I272 Pulmonary hypertension, unspecified: Secondary | ICD-10-CM

## 2024-03-21 DIAGNOSIS — J9611 Chronic respiratory failure with hypoxia: Secondary | ICD-10-CM

## 2024-03-21 LAB — CUP PACEART INCLINIC DEVICE CHECK
Date Time Interrogation Session: 20251202141829
Implantable Lead Connection Status: 753985
Implantable Lead Connection Status: 753985
Implantable Lead Implant Date: 20181219
Implantable Lead Implant Date: 20181219
Implantable Lead Location: 753859
Implantable Lead Location: 753860
Implantable Lead Model: 7740
Implantable Lead Model: 7741
Implantable Lead Serial Number: 721401
Implantable Lead Serial Number: 973684
Implantable Pulse Generator Implant Date: 20181219
Lead Channel Impedance Value: 630 Ohm
Lead Channel Impedance Value: 773 Ohm
Lead Channel Pacing Threshold Amplitude: 0.7 V
Lead Channel Pacing Threshold Amplitude: 0.7 V
Lead Channel Pacing Threshold Pulse Width: 0.4 ms
Lead Channel Pacing Threshold Pulse Width: 0.4 ms
Lead Channel Sensing Intrinsic Amplitude: 25 mV
Lead Channel Sensing Intrinsic Amplitude: 4 mV
Lead Channel Setting Pacing Amplitude: 1.6 V
Lead Channel Setting Pacing Amplitude: 2 V
Lead Channel Setting Pacing Pulse Width: 0.4 ms
Lead Channel Setting Sensing Sensitivity: 0.6 mV
Pulse Gen Serial Number: 813693
Zone Setting Status: 755011

## 2024-03-21 NOTE — Progress Notes (Signed)
 Cardiology Office Note   Date: 03/21/24 ID:  Kelsay Haggard, DOB 1932/05/30, MRN [id number] PCP: Shona Norleen PEDLAR, MD  [facility] Providers Cardiologist:  Diannah SHAUNNA Maywood, MD Electrophysiologist:  Donnice DELENA Primus, MD   History of Present Illness Cindy [name] is a 89 y.o. female with cardiac AL amyloidosis (NEOD001 tx, VITAL trial with subsequent isatuximab), amyloid cardiomyopathy/restrictive cardiomyopathy, HFpEF, persistent AF s/p ablation, AV block s/p BSCI DC PPM (2018), prior PE, RV enlargement and ILD on home O2 who presents for device f/u.   She has been followed at [facility] by Dr. Britta and Ashley Keepers.  She was last seen in their clinic on 12/22/2023 and transitioned her care to us  for convenience as she lives locally in [place].  At her last visit she had significant atrial pacing (96%) with minimal VP (6%) and no significant atrial arrhythmias.  History of Present Illness Cindy [name] is an 89 year old female with amyloidosis who presents with worsening shortness of breath. She was referred by Dena Keepers for evaluation of her cardiovascular condition.  She experiences significant shortness of breath, particularly with minimal exertion such as walking around her house or attending a luncheon. She becomes completely out of breath after walking short distances and uses supplemental oxygen  at a rate of 2 liters per minute.  She has increased phlegm production, for which she uses a nebulizer that helps alleviate her symptoms. No fever is present.  She experiences episodes of feeling her heart racing, especially when walking, despite her pacemaker being set at 75 beats per minute. Her pacemaker was implanted in December 2018.  She has a history of blood clots and has been on blood thinners. Oxygen  therapy was not required until after her husband's death two and a half years ago. She lives alone since his passing.  She has two children; one  son passed away at 57 due to complications from diabetes and dialysis, and her other son, who had widespread cancer, is now cancer-free and works as a programmer, multimedia.  ROS: SOB/DOE  Studies Reviewed  ECG review  09/02/23: [facility] 75, PR 287, QRS 109, QT/c 427/477 09/17/23: [facility] 75, QRS 102, QT/c 412/461 06/19/21: [facility] 88, PR 246, QRS 88, QT/c 378/457  TTE Result date: 09/18/22  1. Left ventricular ejection fraction, by estimation, is 60 to 65%. The  left ventricle has normal function. The left ventricle has no regional  wall motion abnormalities. There is mild left ventricular hypertrophy.  Left ventricular diastolic parameters  are indeterminate.   2. RV poorly visualized. Grossly appears enlarged with decreased systolic  function. . Right ventricular systolic function was not well visualized.  The right ventricular size is not well visualized. There is moderately  elevated pulmonary artery systolic  pressure.   3. The mitral valve is normal in structure. Trivial mitral valve  regurgitation. No evidence of mitral stenosis.   4. The tricuspid valve is abnormal. Tricuspid valve regurgitation is  moderate.   5. The aortic valve is tricuspid. There is mild calcification of the  aortic valve. There is mild thickening of the aortic valve. Aortic valve  regurgitation is not visualized. No aortic stenosis is present.   6. The inferior vena cava is normal in size with greater than 50%  respiratory variability, suggesting right atrial pressure of 3 mmHg.   Risk Assessment/Calculations  CHA2DS2-VASc Score = 4 (AL cardiac amyloidosis so likely not adequately represented) This indicates a 4.8% annual risk of stroke. The patient's score is  based upon: Physical Exam VS:  BP 112/60 (BP Location: Right Arm, Cuff Size: Normal)   Pulse 92   Ht 5' 7 (1.702 m)   Wt 122 lb 9.6 oz (55.6 kg)   SpO2 99%   BMI 19.20 kg/m   Wt Readings from Last 3 Encounters:  03/21/24 122 lb 9.6 oz (55.6 kg)   03/07/24 121 lb 6.4 oz (55.1 kg)  03/06/24 123 lb 14.4 oz (56.2 kg)    GEN: Well nourished, well developed in no acute distress NECK: No JVD; No carotid bruits CARDIAC: RRR, no murmurs, rubs, gallops RESPIRATORY:  Clear to auscultation without rales, wheezing or rhonchi  ABDOMEN: Soft, non-tender, non-distended EXTREMITIES:  No edema; No deformity  SKIN: LEFT infraclavicular incision well healed, no erythema/swelling or erosion, very thin skin   Device information PPM: BSCI L331 [employer] MRI, SN [id number], DOI 04/07/17 RA: BSCI 7740 [employer]vity MRI, SN [id number], DOI 04/07/17 RV: BSCI 7742 [employer]vity MRI, SN [id number], DOI 04/07/17  ASSESSMENT AND PLAN Cindy [name] is a 89 y.o. female with cardiac AL amyloidosis (NEOD001 tx, VITAL trial with subsequent isatuximab), amyloid cardiomyopathy/restrictive cardiomyopathy, HFpEF, persistent AF s/p ablation, AV block s/p BSCI DC PPM (2018), prior PE, RV enlargement and ILD on home O2 who presents for device f/u.  Assessment & Plan Cardiac amyloidosis with restrictive cardiomyopathy Chronic condition with dyspnea and fatigue due to cardiac stiffness. No acute symptom changes. - Continue current management and monitoring.  Paroxysmal atrial fibrillation Managed with pacemaker. Pacemaker settings adjusted to conserve battery life. - Adjusted pacemaker settings to reduce heart rate to 60 bpm.  Chronic respiratory failure with hypoxemia Managed with supplemental oxygen  and nebulizer therapy. - Continue supplemental oxygen  at 2 L/min. - Continue nebulizer therapy as prescribed.  Presence of cardiac pacemaker Pacemaker functioning well. Battery life estimated at six and a half years or more. - Continue regular monitoring of pacemaker function. - Adjusted pacemaker settings to reduce heart rate to 60 bpm.  A total of 35 minutes was spent preparing for the patient, reviewing history, performing exam, document encounter, coordinating care and counseling  the patient. 23 minutes was spent with direct patient care.   Dispo: RTC 6 months   Signed, Donnice DELENA Primus, MD

## 2024-03-21 NOTE — Progress Notes (Signed)
 Pulmonary Individual Treatment Plan  Patient Details  Name: Cindy [name] MRN: [id number] Date of Birth: [birth year] Referring Provider:   Flowsheet Row PULMONARY REHAB OTHER RESP ORIENTATION from 03/06/2024 in [facility] CARDIAC REHABILITATION  Referring Provider Len Cools MD    Initial Encounter Date:  Flowsheet Row PULMONARY REHAB OTHER RESP ORIENTATION from 03/06/2024 in [facility]  Date 03/06/24    Visit Diagnosis: Chronic respiratory failure with hypoxia (HCC)  Pulmonary HTN (HCC)  ILD (interstitial lung disease) (HCC)  Patient's Home Medications on Admission:   Current Outpatient Medications:    acetaminophen  (TYLENOL ) 650 MG CR tablet, Take 650 mg by mouth every 8 (eight) hours as needed for pain or fever., Disp: , Rfl:    albuterol  (PROVENTIL ) (2.5 MG/3ML) 0.083% nebulizer solution, Take 3 mLs (2.5 mg total) by nebulization every 6 (six) hours as needed for wheezing or shortness of breath., Disp: 75 mL, Rfl: 5   albuterol  (PROVENTIL ) (2.5 MG/3ML) 0.083% nebulizer solution, Take 3 mLs (2.5 mg total) by nebulization every 6 (six) hours as needed for wheezing or shortness of breath. 2-3 times a week, Disp: 75 mL, Rfl: 0   ALPRAZolam  (XANAX ) 0.5 MG tablet, Take 1 tablet (0.5 mg total) by mouth at bedtime as needed for anxiety., Disp: 30 tablet, Rfl: 3   amoxicillin (AMOXIL) 875 MG tablet, Take 875 mg by mouth 2 (two) times daily., Disp: , Rfl:    apixaban  (ELIQUIS ) 2.5 MG TABS tablet, Take 1 tablet (2.5 mg total) by mouth 2 (two) times daily., Disp: 180 tablet, Rfl: 1   BIOTIN PO, Take 1 tablet by mouth daily., Disp: , Rfl:    Cholecalciferol  50 MCG (2000 UT) CAPS, Take 1 capsule by mouth daily., Disp: , Rfl:    clobetasol (TEMOVATE) 0.05 % external solution, Apply 1 Application topically 2 (two) times daily., Disp: , Rfl:    cyanocobalamin (VITAMIN B12) 1000 MCG tablet, Take 1,000 mcg by mouth daily., Disp: , Rfl:    FARXIGA  10 MG TABS tablet,  TAKE 1 TABLET BY MOUTH DAILY, Disp: 90 tablet, Rfl: 3   fentaNYL  (DURAGESIC ) 25 MCG/HR, Place 1 patch onto the skin every 3 (three) days., Disp: , Rfl:    Fluocinolone Acetonide 0.01 % OIL, Place 5 drops into both ears as needed (itching). As needed for itching, Disp: , Rfl:    levothyroxine  (SYNTHROID ) 137 MCG tablet, Take 137 mcg by mouth daily., Disp: , Rfl:    metoprolol  succinate (TOPROL -XL) 25 MG 24 hr tablet, Take 0.5-1 tablets (12.5-25 mg total) by mouth in the morning and at bedtime. Patient takes 1 tablet (25mg ) in the morning and 1/2 of a tablet (12.5mg ) in the evening (Patient taking differently: Take 25 mg by mouth daily. Takes 1/2), Disp: , Rfl:    Multiple Vitamins-Minerals (PRESERVISION AREDS PO), Take 1 capsule by mouth daily., Disp: , Rfl:    NYAMYC powder, Apply 1 Application topically 2 (two) times daily., Disp: , Rfl:    ondansetron  (ZOFRAN ) 4 MG tablet, Take 4 mg by mouth every 8 (eight) hours as needed for nausea or vomiting., Disp: , Rfl:    Polyethyl Glycol-Propyl Glycol (SYSTANE OP), Apply 1 drop to eye daily., Disp: , Rfl:    potassium chloride  (KLOR-CON  M) 10 MEQ tablet, Take 10 mEq by mouth daily., Disp: , Rfl:    Probiotic Product (ALIGN) 4 MG CAPS, Take 1 tablet by mouth every morning., Disp: , Rfl:    senna (SENOKOT) 8.6 MG tablet, Take 2 tablets by  mouth as needed for constipation., Disp: , Rfl:    sodium chloride  (OCEAN) 0.65 % nasal spray, Place 2 sprays into the nose daily as needed for congestion., Disp: , Rfl:    torsemide  (DEMADEX ) 20 MG tablet, Take 20 mg (1 tablet) daily alternating with 40 mg (2 tablets), Disp: , Rfl:   Past Medical History: Past Medical History:  Diagnosis Date   Amyloidosis (HCC)    cancer   Cancer (HCC)    CHF (congestive heart failure) (HCC)    Amyloidosis   Diarrhea    Diverticulosis of colon (without mention of hemorrhage)    Edema    Esophagitis, unspecified    Family history of malignant neoplasm of gastrointestinal tract     Fibrosis of lung (HCC) 03/14/2013   Flatulence, eructation, and gas pain    GERD (gastroesophageal reflux disease)    Left sided ulcerative colitis (HCC)    Macular degeneration    left eye   Maxillary sinus mass    Osteoporosis, unspecified    Other and unspecified hyperlipidemia    Other and unspecified noninfectious gastroenteritis and colitis(558.9)    Paroxysmal atrial fibrillation (HCC) 07/09/2019   Peripheral vascular disease    Pneumonia    Stricture and stenosis of esophagus    Unspecified hypothyroidism    URI (upper respiratory infection)     Tobacco Use: Social History   Tobacco Use  Smoking Status Never   Passive exposure: Never  Smokeless Tobacco Never    Labs: Review Flowsheet  More data may exist      Latest Ref Rng & Units 07/15/2012 03/14/2013 06/29/2013 12/21/2013 04/06/2014  Labs for ITP Cardiac and Pulmonary Rehab  Cholestrol 0 - 200 mg/dL 778  - 799  757  798   LDL (calc) 0 - 99 mg/dL 845  - 859  816  852   HDL-C >39.00 mg/dL 50  - 49  56.29  60.69   Trlycerides 0.0 - 149.0 mg/dL 86  - 57  24.9  26.9   Hemoglobin A1c <5.7 % - 6.1  - - -    Capillary Blood Glucose: Lab Results  Component Value Date   GLUCAP 89 07/08/2019     Pulmonary Assessment Scores:  Pulmonary Assessment Scores     Row Name 03/06/24 1427         ADL UCSD   ADL Phase Entry     SOB Score total 82     Rest 2     Walk 3     Stairs 5     Bath 3     Dress 4     Shop 4       CAT Score   CAT Score 21       mMRC Score   mMRC Score 3       UCSD: Self-administered rating of dyspnea associated with activities of daily living (ADLs) 6-point scale (0 = not at all to 5 = maximal or unable to do because of breathlessness)  Scoring Scores range from 0 to 120.  Minimally important difference is 5 units  CAT: CAT can identify the health impairment of COPD patients and is better correlated with disease progression.  CAT has a scoring range of zero to 40. The CAT  score is classified into four groups of low (less than 10), medium (10 - 20), high (21-30) and very high (31-40) based on the impact level of disease on health status. A CAT score over 10 suggests significant symptoms.  A worsening CAT score could be explained by an exacerbation, poor medication adherence, poor inhaler technique, or progression of COPD or comorbid conditions.  CAT MCID is 2 points  mMRC: mMRC (Modified Medical Research Council) Dyspnea Scale is used to assess the degree of baseline functional disability in patients of respiratory disease due to dyspnea. No minimal important difference is established. A decrease in score of 1 point or greater is considered a positive change.   Pulmonary Function Assessment:  Pulmonary Function Assessment - 03/06/24 1527       Breath   Shortness of Breath Fear of Shortness of Breath;Yes;Limiting activity;Panic with Shortness of Breath          Exercise Target Goals: Exercise Program Goal: Individual exercise prescription set using results from initial 6 min walk test and THRR while considering  patient's activity barriers and safety.   Exercise Prescription Goal: Initial exercise prescription builds to 30-45 minutes a day of aerobic activity, 2-3 days per week.  Home exercise guidelines will be given to patient during program as part of exercise prescription that the participant will acknowledge.  Activity Barriers & Risk Stratification:  Activity Barriers & Cardiac Risk Stratification - 02/23/24 1305       Activity Barriers & Cardiac Risk Stratification   Activity Barriers Shortness of Breath;Balance Concerns;Assistive Device;Deconditioning;Arthritis   Uses cane and walker when needed.         6 Minute Walk:  6 Minute Walk     Row Name 03/06/24 1521         6 Minute Walk   Phase Initial     Distance 190 feet     Walk Time 2 minutes     # of Rest Breaks 0  stopped due to desaturation     MPH 1.08     METS 0.48     RPE  17     Perceived Dyspnea  3     VO2 Peak 1.68     Symptoms Yes (comment)     Comments SOB, desaturation     Resting HR 82 bpm     Resting BP 100/62     Resting Oxygen  Saturation  94 %     Exercise Oxygen  Saturation  during 6 min walk 75 %     Max Ex. HR 96 bpm     Max Ex. BP 148/70     2 Minute Post BP 136/74       Interval HR   1 Minute HR 96     2 Minute HR 96     2 Minute Post HR 92     Interval Heart Rate? Yes       Interval Oxygen    Interval Oxygen ? Yes     Baseline Oxygen  Saturation % 94 %     1 Minute Oxygen  Saturation % 85 %     1 Minute Liters of Oxygen  2 L  NCP POC     2 Minute Oxygen  Saturation % 75 %     2 Minute Liters of Oxygen  2 L     2 Minute Post Oxygen  Saturation % 91 %     2 Minute Post Liters of Oxygen  2 L        Oxygen  Initial Assessment:  Oxygen  Initial Assessment - 03/06/24 1527       Home Oxygen    Home Oxygen  Device Home Concentrator;E-Tanks    Sleep Oxygen  Prescription Continuous    Liters per minute 2    Home Exercise Oxygen  Prescription  Continuous    Liters per minute 2    Home Resting Oxygen  Prescription Continuous    Liters per minute 2    Compliance with Home Oxygen  Use Yes      Initial 6 min Walk   Oxygen  Used Portable Concentrator;Pulsed    Liters per minute 2      Program Oxygen  Prescription   Program Oxygen  Prescription Continuous;E-Tanks    Liters per minute 2      Intervention   Short Term Goals To learn and exhibit compliance with exercise, home and travel O2 prescription;To learn and understand importance of monitoring SPO2 with pulse oximeter and demonstrate accurate use of the pulse oximeter.;To learn and understand importance of maintaining oxygen  saturations>88%;To learn and demonstrate proper pursed lip breathing techniques or other breathing techniques. ;To learn and demonstrate proper use of respiratory medications    Long  Term Goals Exhibits compliance with exercise, home  and travel O2 prescription;Verbalizes  importance of monitoring SPO2 with pulse oximeter and return demonstration;Maintenance of O2 saturations>88%;Exhibits proper breathing techniques, such as pursed lip breathing or other method taught during program session;Compliance with respiratory medication;Demonstrates proper use of MDI's          Oxygen  Re-Evaluation:  Oxygen  Re-Evaluation     Row Name 03/13/24 1110             Goals/Expected Outcomes   Long  Term Goals Maintenance of O2 saturations>88%;Exhibits compliance with exercise, home  and travel O2 prescription       Comments Reviewed PLB technique with pt.  Talked about how it works and it's importance in maintaining their exercise saturations.       Goals/Expected Outcomes Short: Become more profiecient at using PLB.   Long: Become independent at using PLB.          Oxygen  Discharge (Final Oxygen  Re-Evaluation):  Oxygen  Re-Evaluation - 03/13/24 1110       Goals/Expected Outcomes   Long  Term Goals Maintenance of O2 saturations>88%;Exhibits compliance with exercise, home  and travel O2 prescription    Comments Reviewed PLB technique with pt.  Talked about how it works and it's importance in maintaining their exercise saturations.    Goals/Expected Outcomes Short: Become more profiecient at using PLB.   Long: Become independent at using PLB.          Initial Exercise Prescription:  Initial Exercise Prescription - 03/06/24 1500       Date of Initial Exercise RX and Referring Provider   Date 03/06/24    Referring Provider Algharnim, Fahid MD      Oxygen    Oxygen  Continuous    Liters 2    Maintain Oxygen  Saturation 88% or higher      NuStep   Level 1    SPM 60    Minutes 15    METs 1.5      REL-XR   Level 1    Speed 50    Minutes 15    METs 1.5      Prescription Details   Frequency (times per week) 2    Duration Progress to 30 minutes of continuous aerobic without signs/symptoms of physical distress      Intensity   THRR 40-80% of Max  Heartrate 105-122    Ratings of Perceived Exertion 11-13    Perceived Dyspnea 0-4      Progression   Progression Continue to progress workloads to maintain intensity without signs/symptoms of physical distress.      Resistance Training  Training Prescription Yes    Weight 3 lb    Reps 10-15          Perform Capillary Blood Glucose checks as needed.  Exercise Prescription Changes:   Exercise Prescription Changes     Row Name 03/06/24 1500             Response to Exercise   Blood Pressure (Admit) 100/62       Blood Pressure (Exercise) 148/70       Blood Pressure (Exit) 126/64       Heart Rate (Admit) 82 bpm       Heart Rate (Exercise) 96 bpm       Heart Rate (Exit) 92 bpm       Oxygen  Saturation (Admit) 94 %       Oxygen  Saturation (Exercise) 75 %       Oxygen  Saturation (Exit) 91 %       Rating of Perceived Exertion (Exercise) 17       Perceived Dyspnea (Exercise) 3       Symptoms SOb, destaruation       Comments walk test results          Exercise Comments:   Exercise Comments     Row Name 03/06/24 1426 03/13/24 1108         Exercise Comments Patient attend orientation today.  Patient is attending Pulmonary Rehabilitation Program.  Documentation for diagnosis can be found in [facility].  Reviewed medical chart, RPE/RPD, gym safety, and program guidelines.  Patient was fitted to equipment they will be using during rehab.  Patient is scheduled to start exercise on 03/13/24.   Initial ITP created and sent for review and signature by Dr. Anton Kelp, Medical Director for Pulmonary Rehabilitation Program. First full day of exercise!  Patient was oriented to gym and equipment including functions, settings, policies, and procedures.  Patient's individual exercise prescription and treatment plan were reviewed.  All starting workloads were established based on the results of the 6 minute walk test done at initial orientation visit.  The plan for exercise progression was also  introduced and progression will be customized based on patient's performance and goals.         Exercise Goals and Review:   Exercise Goals     Row Name 03/06/24 1524             Exercise Goals   Increase Physical Activity Yes       Intervention Provide advice, education, support and counseling about physical activity/exercise needs.;Develop an individualized exercise prescription for aerobic and resistive training based on initial evaluation findings, risk stratification, comorbidities and participant's personal goals.       Expected Outcomes Short Term: Attend rehab on a regular basis to increase amount of physical activity.;Long Term: Exercising regularly at least 3-5 days a week.;Long Term: Add in home exercise to make exercise part of routine and to increase amount of physical activity.       Increase Strength and Stamina Yes       Intervention Provide advice, education, support and counseling about physical activity/exercise needs.;Develop an individualized exercise prescription for aerobic and resistive training based on initial evaluation findings, risk stratification, comorbidities and participant's personal goals.       Expected Outcomes Short Term: Increase workloads from initial exercise prescription for resistance, speed, and METs.;Short Term: Perform resistance training exercises routinely during rehab and add in resistance training at home;Long Term: Improve cardiorespiratory fitness, muscular endurance and strength as measured by  increased METs and functional capacity ( )       Able to understand and use rate of perceived exertion (RPE) scale Yes       Intervention Provide education and explanation on how to use RPE scale       Expected Outcomes Long Term:  Able to use RPE to guide intensity level when exercising independently;Short Term: Able to use RPE daily in rehab to express subjective intensity level       Able to understand and use Dyspnea scale Yes        Intervention Provide education and explanation on how to use Dyspnea scale       Expected Outcomes Short Term: Able to use Dyspnea scale daily in rehab to express subjective sense of shortness of breath during exertion;Long Term: Able to use Dyspnea scale to guide intensity level when exercising independently       Knowledge and understanding of Target Heart Rate Range (THRR) Yes       Intervention Provide education and explanation of THRR including how the numbers were predicted and where they are located for reference       Expected Outcomes Short Term: Able to state/look up THRR;Long Term: Able to use THRR to govern intensity when exercising independently;Short Term: Able to use daily as guideline for intensity in rehab       Able to check pulse independently Yes       Intervention Provide education and demonstration on how to check pulse in carotid and radial arteries.;Review the importance of being able to check your own pulse for safety during independent exercise       Expected Outcomes Long Term: Able to check pulse independently and accurately;Short Term: Able to explain why pulse checking is important during independent exercise       Understanding of Exercise Prescription Yes       Intervention Provide education, explanation, and written materials on patient's individual exercise prescription       Expected Outcomes Long Term: Able to explain home exercise prescription to exercise independently;Short Term: Able to explain program exercise prescription          Exercise Goals Re-Evaluation :  Exercise Goals Re-Evaluation     Row Name 03/13/24 1109             Exercise Goal Re-Evaluation   Exercise Goals Review Able to understand and use rate of perceived exertion (RPE) scale;Knowledge and understanding of Target Heart Rate Range (THRR)       Comments Reviewed RPE and dyspnea scale, THR and program prescription with pt today.  Pt voiced understanding and was given a copy of goals to  take home.       Expected Outcomes Short: Use RPE daily to regulate intensity.  Long: Follow program prescription in THR.          Discharge Exercise Prescription (Final Exercise Prescription Changes):  Exercise Prescription Changes - 03/06/24 1500       Response to Exercise   Blood Pressure (Admit) 100/62    Blood Pressure (Exercise) 148/70    Blood Pressure (Exit) 126/64    Heart Rate (Admit) 82 bpm    Heart Rate (Exercise) 96 bpm    Heart Rate (Exit) 92 bpm    Oxygen  Saturation (Admit) 94 %    Oxygen  Saturation (Exercise) 75 %    Oxygen  Saturation (Exit) 91 %    Rating of Perceived Exertion (Exercise) 17    Perceived Dyspnea (Exercise) 3    Symptoms  SOb, destaruation    Comments walk test results          Nutrition:  Target Goals: Understanding of nutrition guidelines, daily intake of sodium 1500mg , cholesterol 200mg , calories 30% from fat and 7% or less from saturated fats, daily to have 5 or more servings of fruits and vegetables.  Biometrics:  Pre Biometrics - 03/06/24 1525       Pre Biometrics   Height 5' 7 (1.702 m)    Weight 123 lb 14.4 oz (56.2 kg)    Waist Circumference 29.5 inches    Hip Circumference 36 inches    Waist to Hip Ratio 0.82 %    BMI (Calculated) 19.4    Grip Strength 9.8 kg    Single Leg Stand 1.2 seconds           Nutrition Therapy Plan and Nutrition Goals:  Nutrition Therapy & Goals - 03/06/24 1526       Intervention Plan   Intervention Prescribe, educate and counsel regarding individualized specific dietary modifications aiming towards targeted core components such as weight, hypertension, lipid management, diabetes, heart failure and other comorbidities.;Nutrition handout(s) given to patient.    Expected Outcomes Short Term Goal: Understand basic principles of dietary content, such as calories, fat, sodium, cholesterol and nutrients.;Long Term Goal: Adherence to prescribed nutrition plan.          Nutrition  Assessments:  MEDIFICTS Score Key: >=70 Need to make dietary changes  40-70 Heart Healthy Diet <= 40 Therapeutic Level Cholesterol Diet   Picture Your Plate Scores: <59 Unhealthy dietary pattern with much room for improvement. 41-50 Dietary pattern unlikely to meet recommendations for good health and room for improvement. 51-60 More healthful dietary pattern, with some room for improvement.  >60 Healthy dietary pattern, although there may be some specific behaviors that could be improved.    Nutrition Goals Re-Evaluation:   Nutrition Goals Discharge (Final Nutrition Goals Re-Evaluation):   Psychosocial: Target Goals: Acknowledge presence or absence of significant depression and/or stress, maximize coping skills, provide positive support system. Participant is able to verbalize types and ability to use techniques and skills needed for reducing stress and depression.  Initial Review & Psychosocial Screening:  Initial Psych Review & Screening - 02/23/24 1334       Initial Review   Current issues with None Identified      Family Dynamics   Good Support System? Yes    Comments Patient has a large extended family that supports her.      Barriers   Psychosocial barriers to participate in program The patient should benefit from training in stress management and relaxation.;There are no identifiable barriers or psychosocial needs.      Screening Interventions   Interventions Encouraged to exercise;To provide support and resources with identified psychosocial needs;Provide feedback about the scores to participant    Expected Outcomes Short Term goal: Utilizing psychosocial counselor, staff and physician to assist with identification of specific Stressors or current issues interfering with healing process. Setting desired goal for each stressor or current issue identified.;Long Term Goal: Stressors or current issues are controlled or eliminated.;Short Term goal: Identification and review  with participant of any Quality of Life or Depression concerns found by scoring the questionnaire.;Long Term goal: The participant improves quality of Life and PHQ9 Scores as seen by post scores and/or verbalization of changes          Quality of Life Scores:  Scores of 19 and below usually indicate a poorer quality of life in  these areas.  A difference of  2-3 points is a clinically meaningful difference.  A difference of 2-3 points in the total score of the Quality of Life Index has been associated with significant improvement in overall quality of life, self-image, physical symptoms, and general health in studies assessing change in quality of life.   PHQ-9: Review Flowsheet       03/06/2024 01/19/2014 01/05/2014 05/18/2013  Depression screen PHQ 2/9  Decreased Interest 0 0 0 0  Down, Depressed, Hopeless 0 0 0 0  PHQ - 2 Score 0 0 0 0  Altered sleeping 0 - - -  Tired, decreased energy 3 - - -  Change in appetite 3 - - -  Feeling bad or failure about yourself  0 - - -  Trouble concentrating 0 - - -  Moving slowly or fidgety/restless 0 - - -  Suicidal thoughts 0 - - -  PHQ-9 Score 6 - - -  Difficult doing work/chores Somewhat difficult - - -   Interpretation of Total Score  Total Score Depression Severity:  1-4 = Minimal depression, 5-9 = Mild depression, 10-14 = Moderate depression, 15-19 = Moderately severe depression, 20-27 = Severe depression   Psychosocial Evaluation and Intervention:  Psychosocial Evaluation - 02/23/24 1335       Psychosocial Evaluation & Interventions   Interventions Stress management education;Relaxation education;Encouraged to exercise with the program and follow exercise prescription    Comments Patient was referred to [facility] with chronic respiratory failure with hypoxia; pulmonary HTN and ILD. She denies any depression or anxiety. She says she sleeps well at night using Alprazalam. Her grandson lives in her basement but she says she rarely sees him, but  he is there if she needs him. 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 She is looking forward to participating in the program. Her goals are to get stronger in her legs and feet and to improve her confidence in her ability to do things. She has no barriers identified to complete the program.    Expected Outcomes Short Term: Patient will start the program and attend consistently. Long Term: Patient will complete the program meeting personal goals.    Continue Psychosocial Services  Follow up required by staff          Psychosocial Re-Evaluation:   Psychosocial Discharge (Final Psychosocial Re-Evaluation):    Education: Education Goals: Education classes will be provided on a weekly basis, covering required topics. Participant will state understanding/return demonstration of topics presented.  Learning Barriers/Preferences:  Learning Barriers/Preferences - 02/23/24 1318       Learning Barriers/Preferences   Learning Barriers None    Learning Preferences Audio;Skilled Demonstration          Education Topics: How Lungs Work and Diseases: - Discuss the anatomy of the lungs and diseases that can affect the lungs, such as COPD.   Exercise: -Discuss the importance of exercise, FITT principles of exercise, normal and abnormal responses to exercise, and how to exercise safely.   Environmental Irritants: -Discuss types of environmental irritants and how to limit exposure to environmental irritants.   Meds/Inhalers and oxygen : - Discuss respiratory medications, definition of an inhaler and oxygen , and the proper way to use an inhaler and oxygen .   Energy Saving Techniques: - Discuss methods to conserve energy and decrease shortness of breath when performing activities of  daily living.    Bronchial Hygiene / Breathing Techniques: - Discuss breathing  mechanics, pursed-lip breathing technique,  proper posture, effective ways to clear airways, and other functional breathing techniques   Cleaning Equipment: - Provides group verbal and written instruction about the health risks of elevated stress, cause of high stress, and healthy ways to reduce stress.   Nutrition I: Fats: - Discuss the types of cholesterol, what cholesterol does to the body, and how cholesterol levels can be controlled.   Nutrition II: Labels: -Discuss the different components of food labels and how to read food labels.   Respiratory Infections: - Discuss the signs and symptoms of respiratory infections, ways to prevent respiratory infections, and the importance of seeking medical treatment when having a respiratory infection.   Stress I: Signs and Symptoms: - Discuss the causes of stress, how stress may lead to anxiety and depression, and ways to limit stress.   Stress II: Relaxation: -Discuss relaxation techniques to limit stress.   Oxygen  for Home/Travel: - Discuss how to prepare for travel when on oxygen  and proper ways to transport and store oxygen  to ensure safety.   Knowledge Questionnaire Score:  Knowledge Questionnaire Score - 03/06/24 1427       Knowledge Questionnaire Score   Pre Score 15/18          Core Components/Risk Factors/Patient Goals at Admission:  Personal Goals and Risk Factors at Admission - 03/06/24 1526       Core Components/Risk Factors/Patient Goals on Admission    Weight Management Weight Maintenance;Yes;Weight Gain    Intervention Weight Management: Develop a combined nutrition and exercise program designed to reach desired caloric intake, while maintaining appropriate intake of nutrient and fiber, sodium and fats, and appropriate energy expenditure required for the weight goal.;Weight Management: Provide education and appropriate resources to help participant work on and attain dietary goals.    Admit Weight 123 lb 14.4  oz (56.2 kg)    Goal Weight: Short Term 125 lb (56.7 kg)    Goal Weight: Long Term 125 lb (56.7 kg)    Expected Outcomes Short Term: Continue to assess and modify interventions until short term weight is achieved;Long Term: Adherence to nutrition and physical activity/exercise program aimed toward attainment of established weight goal;Weight Maintenance: Understanding of the daily nutrition guidelines, which includes 25-35% calories from fat, 7% or less cal from saturated fats, less than 200mg  cholesterol, less than 1.5gm of sodium, & 5 or more servings of fruits and vegetables daily;Weight Gain: Understanding of general recommendations for a high calorie, high protein meal plan that promotes weight gain by distributing calorie intake throughout the day with the consumption for 4-5 meals, snacks, and/or supplements    Improve shortness of breath with ADL's Yes    Intervention Provide education, individualized exercise plan and daily activity instruction to help decrease symptoms of SOB with activities of daily living.    Expected Outcomes Short Term: Improve cardiorespiratory fitness to achieve a reduction of symptoms when performing ADLs;Long Term: Be able to perform more ADLs without symptoms or delay the onset of symptoms    Increase knowledge of respiratory medications and ability to use respiratory devices properly  Yes    Intervention Provide education and demonstration as needed of appropriate use of medications, inhalers, and oxygen  therapy.    Expected Outcomes Short Term: Achieves understanding of medications use. Understands that oxygen  is a medication prescribed by physician. Demonstrates appropriate use of inhaler and oxygen  therapy.;Long Term: Maintain appropriate use of medications, inhalers,  and oxygen  therapy.    Hypertension Yes    Intervention Provide education on lifestyle modifcations including regular physical activity/exercise, weight management, moderate sodium restriction and  increased consumption of fresh fruit, vegetables, and low fat dairy, alcohol  moderation, and smoking cessation.;Monitor prescription use compliance.    Expected Outcomes Short Term: Continued assessment and intervention until BP is < 140/69mm HG in hypertensive participants. < 130/29mm HG in hypertensive participants with diabetes, heart failure or chronic kidney disease.;Long Term: Maintenance of blood pressure at goal levels.          Core Components/Risk Factors/Patient Goals Review:    Core Components/Risk Factors/Patient Goals at Discharge (Final Review):    ITP Comments:  ITP Comments     Row Name 02/23/24 1341 03/06/24 1425 03/13/24 1108 03/21/24 0943     ITP Comments Virtual orientation visit completed for pulmonary rehab with chronic respiratory failure/pulmonary HTN and ILD. On-site orientation visit scheduled for 02/28/24 at 2:15. Patient attend orientation today.  Patient is attending Pulmonary Rehabilitation Program.  Documentation for diagnosis can be found in [facility].  Reviewed medical chart, RPE/RPD, gym safety, and program guidelines.  Patient was fitted to equipment they will be using during rehab.  Patient is scheduled to start exercise on 03/13/24.   Initial ITP created and sent for review and signature by Dr. Anton Kelp, Medical Director for Pulmonary Rehabilitation Program. First full day of exercise!  Patient was oriented to gym and equipment including functions, settings, policies, and procedures.  Patient's individual exercise prescription and treatment plan were reviewed.  All starting workloads were established based on the results of the 6 minute walk test done at initial orientation visit.  The plan for exercise progression was also introduced and progression will be customized based on patient's performance and goals. 30 day review completed. ITP sent to Dr.Jehanzeb Memon, Medical Director of  Pulmonary Rehab. Continue with ITP unless changes are made by physician.   Still new to program       Comments: 30 day review

## 2024-03-21 NOTE — Patient Instructions (Signed)

## 2024-03-22 ENCOUNTER — Encounter (HOSPITAL_COMMUNITY)
Admission: RE | Admit: 2024-03-22 | Discharge: 2024-03-22 | Disposition: A | Source: Ambulatory Visit | Attending: Pulmonary Disease | Admitting: Pulmonary Disease

## 2024-03-22 DIAGNOSIS — J9611 Chronic respiratory failure with hypoxia: Secondary | ICD-10-CM

## 2024-03-22 DIAGNOSIS — I272 Pulmonary hypertension, unspecified: Secondary | ICD-10-CM

## 2024-03-22 DIAGNOSIS — J849 Interstitial pulmonary disease, unspecified: Secondary | ICD-10-CM

## 2024-03-22 NOTE — Progress Notes (Signed)
 Daily Session Note  Patient Details  Name: Cindy [name] MRN: [id number] Date of Birth: [birth year] Referring Provider:   Flowsheet Row PULMONARY REHAB OTHER RESP ORIENTATION from 03/06/2024 in [facility] CARDIAC REHABILITATION  Referring Provider Len Cools MD    Encounter Date: 03/22/2024  Check In:  Session Check In - 03/22/24 1155       Check-In   Supervising physician immediately available to respond to emergencies See telemetry face sheet for immediately available MD    Location [facility]    Staff Present Powell Benders, BS, Exercise Physiologist;Brittany Jackquline, BSN, RN, WTA-C    Virtual Visit No    Medication changes reported     No    Fall or balance concerns reported    No    Warm-up and Cool-down Performed on first and last piece of equipment    Resistance Training Performed Yes    VAD Patient? No    PAD/SET Patient? No      Pain Assessment   Currently in Pain? No/denies          Capillary Blood Glucose: No results found for this or any previous visit (from the past 24 hours).    Social History   Tobacco Use  Smoking Status Never   Passive exposure: Never  Smokeless Tobacco Never    Goals Met:  Independence with exercise equipment Exercise tolerated well No report of concerns or symptoms today Strength training completed today  Goals Unmet:  Not Applicable  Comments: Pt able to follow exercise prescription today without complaint.  Will continue to monitor for progression.

## 2024-03-22 NOTE — Progress Notes (Signed)
 Daily Session Note  Patient Details  Name: Erricka Falkner MRN: [id number] Date of Birth: 26-May-1932 Referring Provider:   Flowsheet Row PULMONARY REHAB OTHER RESP ORIENTATION from 03/06/2024 in [facility] CARDIAC REHABILITATION  Referring Provider Len Cools MD    Encounter Date: 03/22/2024  Check In:  Session Check In - 03/22/24 1155       Check-In   Supervising physician immediately available to respond to emergencies See telemetry face sheet for immediately available MD    Location [facility]    Staff Present Powell Benders, BS, Exercise Physiologist;Brittany Jackquline, BSN, RN, WTA-C    Virtual Visit No    Medication changes reported     No    Fall or balance concerns reported    No    Warm-up and Cool-down Performed on first and last piece of equipment    Resistance Training Performed Yes    VAD Patient? No    PAD/SET Patient? No      Pain Assessment   Currently in Pain? No/denies          Capillary Blood Glucose: No results found for this or any previous visit (from the past 24 hours).    Social History   Tobacco Use  Smoking Status Never   Passive exposure: Never  Smokeless Tobacco Never    Goals Met:  Proper associated with RPD/PD & O2 Sat Exercise tolerated well No report of concerns or symptoms today Strength training completed today  Goals Unmet:  Not Applicable  Comments: Pt able to follow exercise prescription today without complaint.  Will continue to monitor for progression.

## 2024-03-27 ENCOUNTER — Encounter (HOSPITAL_COMMUNITY)

## 2024-03-29 ENCOUNTER — Encounter (HOSPITAL_COMMUNITY)
Admission: RE | Admit: 2024-03-29 | Discharge: 2024-03-29 | Disposition: A | Discharge status: Home / Self Care | Source: Ambulatory Visit | Attending: Pulmonary Disease | Admitting: Pulmonary Disease

## 2024-03-29 DIAGNOSIS — I272 Pulmonary hypertension, unspecified: Secondary | ICD-10-CM

## 2024-03-29 DIAGNOSIS — J849 Interstitial pulmonary disease, unspecified: Secondary | ICD-10-CM

## 2024-03-29 DIAGNOSIS — J9611 Chronic respiratory failure with hypoxia: Secondary | ICD-10-CM

## 2024-03-29 NOTE — Progress Notes (Signed)
 Daily Session Note  Patient Details  Name: Cindy [name] MRN: [id number] Date of Birth: June 04, 1932 Referring Provider:   Flowsheet Row PULMONARY REHAB OTHER RESP ORIENTATION from 03/06/2024 in [facility] CARDIAC REHABILITATION  Referring Provider Len Cools MD    Encounter Date: 03/29/2024  Check In:  Session Check In - 03/29/24 1056       Check-In   Supervising physician immediately available to respond to emergencies See telemetry face sheet for immediately available MD    Location [facility]    Staff Present Powell Benders, BS, Exercise Physiologist;Efraim Vanallen Jackquline, BSN, RN, Rosalba Gelineau, MA, RCEP, CCRP, CCET    Virtual Visit No    Medication changes reported     No    Fall or balance concerns reported    No    Tobacco Cessation No Change    Warm-up and Cool-down Performed on first and last piece of equipment    Resistance Training Performed Yes    VAD Patient? No    PAD/SET Patient? No      Pain Assessment   Currently in Pain? No/denies          Capillary Blood Glucose: No results found for this or any previous visit (from the past 24 hours).    Social History   Tobacco Use  Smoking Status Never   Passive exposure: Never  Smokeless Tobacco Never    Goals Met:  Proper associated with RPD/PD & O2 Sat Independence with exercise equipment Improved SOB with ADL's Using PLB without cueing & demonstrates good technique Exercise tolerated well No report of concerns or symptoms today Strength training completed today  Goals Unmet:  Not Applicable  Comments: Pt able to follow exercise prescription today without complaint.  Will continue to monitor for progression.

## 2024-04-03 ENCOUNTER — Encounter (HOSPITAL_COMMUNITY)

## 2024-04-05 ENCOUNTER — Encounter (HOSPITAL_COMMUNITY)
Admission: RE | Admit: 2024-04-05 | Discharge: 2024-04-05 | Disposition: A | Discharge status: Home / Self Care | Source: Ambulatory Visit | Attending: Pulmonary Disease | Admitting: Pulmonary Disease

## 2024-04-05 ENCOUNTER — Encounter (HOSPITAL_COMMUNITY)

## 2024-04-05 ENCOUNTER — Telehealth (HOSPITAL_COMMUNITY): Payer: Self-pay

## 2024-04-05 DIAGNOSIS — J849 Interstitial pulmonary disease, unspecified: Secondary | ICD-10-CM

## 2024-04-05 DIAGNOSIS — J9611 Chronic respiratory failure with hypoxia: Secondary | ICD-10-CM | POA: Diagnosis not present

## 2024-04-05 DIAGNOSIS — I272 Pulmonary hypertension, unspecified: Secondary | ICD-10-CM

## 2024-04-05 NOTE — Telephone Encounter (Signed)
 Attempted to call patient regarding missing class today, no answer.

## 2024-04-05 NOTE — Progress Notes (Signed)
 Daily Session Note  Patient Details  Name: Cindy [name] MRN: [id number] Date of Birth: [birth year] Referring Provider:   Flowsheet Row PULMONARY REHAB OTHER RESP ORIENTATION from 03/06/2024 in [facility] CARDIAC REHABILITATION  Referring Provider Len Cools MD    Encounter Date: 04/05/2024  Check In:  Session Check In - 04/05/24 1115       Check-In   Supervising physician immediately available to respond to emergencies See telemetry face sheet for immediately available MD    Location [facility]    Staff Present Laymon Rattler, BSN, RN, Rosalba Gelineau, MA, RCEP, CCRP, CCET;[employer], RN    Virtual Visit No    Medication changes reported     No    Fall or balance concerns reported    No    Tobacco Cessation No Change    Warm-up and Cool-down Performed on first and last piece of equipment    Resistance Training Performed Yes    VAD Patient? No    PAD/SET Patient? No      Pain Assessment   Currently in Pain? No/denies          Capillary Blood Glucose: No results found for this or any previous visit (from the past 24 hours).    Tobacco Use History[1]  Goals Met:  Proper associated with RPD/PD & O2 Sat Independence with exercise equipment Improved SOB with ADL's Using PLB without cueing & demonstrates good technique Exercise tolerated well No report of concerns or symptoms today Strength training completed today  Goals Unmet:  Not Applicable  Comments: Pt able to follow exercise prescription today without complaint.  Will continue to monitor for progression.        [1]  Social History Tobacco Use  Smoking Status Never   Passive exposure: Never  Smokeless Tobacco Never

## 2024-04-06 DIAGNOSIS — E039 Hypothyroidism, unspecified: Secondary | ICD-10-CM | POA: Diagnosis not present

## 2024-04-06 DIAGNOSIS — F411 Generalized anxiety disorder: Secondary | ICD-10-CM | POA: Diagnosis not present

## 2024-04-06 DIAGNOSIS — J849 Interstitial pulmonary disease, unspecified: Secondary | ICD-10-CM | POA: Diagnosis not present

## 2024-04-06 DIAGNOSIS — K5909 Other constipation: Secondary | ICD-10-CM | POA: Diagnosis not present

## 2024-04-06 DIAGNOSIS — K219 Gastro-esophageal reflux disease without esophagitis: Secondary | ICD-10-CM | POA: Diagnosis not present

## 2024-04-06 DIAGNOSIS — G894 Chronic pain syndrome: Secondary | ICD-10-CM | POA: Diagnosis not present

## 2024-04-06 DIAGNOSIS — D8989 Other specified disorders involving the immune mechanism, not elsewhere classified: Secondary | ICD-10-CM | POA: Diagnosis not present

## 2024-04-06 DIAGNOSIS — R0982 Postnasal drip: Secondary | ICD-10-CM | POA: Diagnosis not present

## 2024-04-06 DIAGNOSIS — I509 Heart failure, unspecified: Secondary | ICD-10-CM | POA: Diagnosis not present

## 2024-04-06 DIAGNOSIS — I2699 Other pulmonary embolism without acute cor pulmonale: Secondary | ICD-10-CM | POA: Diagnosis not present

## 2024-04-06 DIAGNOSIS — I272 Pulmonary hypertension, unspecified: Secondary | ICD-10-CM | POA: Diagnosis not present

## 2024-04-06 DIAGNOSIS — I4891 Unspecified atrial fibrillation: Secondary | ICD-10-CM | POA: Diagnosis not present

## 2024-04-10 ENCOUNTER — Encounter (HOSPITAL_COMMUNITY): Admission: RE | Admit: 2024-04-10 | Discharge: 2024-04-10 | Attending: Pulmonary Disease | Admitting: Pulmonary Disease

## 2024-04-10 DIAGNOSIS — I272 Pulmonary hypertension, unspecified: Secondary | ICD-10-CM

## 2024-04-10 DIAGNOSIS — J9611 Chronic respiratory failure with hypoxia: Secondary | ICD-10-CM | POA: Diagnosis not present

## 2024-04-10 DIAGNOSIS — J849 Interstitial pulmonary disease, unspecified: Secondary | ICD-10-CM

## 2024-04-10 NOTE — Progress Notes (Signed)
 Daily Session Note  Patient Details  Name: Claryce Friel MRN: [id number] Date of Birth: [birth year] Referring Provider:   Flowsheet Row PULMONARY REHAB OTHER RESP ORIENTATION from 03/06/2024 in [facility] CARDIAC REHABILITATION  Referring Provider Len Cools MD    Encounter Date: 04/10/2024  Check In:  Session Check In - 04/10/24 1109       Check-In   Supervising physician immediately available to respond to emergencies See telemetry face sheet for immediately available MD    Location [facility]    Staff Present Powell Benders, BS, Exercise Physiologist;Brittany Jackquline, BSN, RN, Rosalba Gelineau, MA, RCEP, CCRP, CCET    Virtual Visit No    Medication changes reported     No    Fall or balance concerns reported    No    Tobacco Cessation No Change    Warm-up and Cool-down Performed on first and last piece of equipment    Resistance Training Performed Yes    VAD Patient? No    PAD/SET Patient? No      Pain Assessment   Currently in Pain? No/denies    Pain Score 0-No pain    Multiple Pain Sites No          Capillary Blood Glucose: No results found for this or any previous visit (from the past 24 hours).    Tobacco Use History[1]  Goals Met:  Independence with exercise equipment Exercise tolerated well No report of concerns or symptoms today Strength training completed today  Goals Unmet:  Not Applicable  Comments: Pt able to follow exercise prescription today without complaint.  Will continue to monitor for progression.        [1]  Social History Tobacco Use  Smoking Status Never   Passive exposure: Never  Smokeless Tobacco Never

## 2024-04-12 ENCOUNTER — Encounter (HOSPITAL_COMMUNITY)

## 2024-04-17 ENCOUNTER — Encounter (HOSPITAL_COMMUNITY)

## 2024-04-18 ENCOUNTER — Encounter (HOSPITAL_COMMUNITY): Payer: Self-pay | Admitting: *Deleted

## 2024-04-18 DIAGNOSIS — J9611 Chronic respiratory failure with hypoxia: Secondary | ICD-10-CM

## 2024-04-18 DIAGNOSIS — I272 Pulmonary hypertension, unspecified: Secondary | ICD-10-CM

## 2024-04-18 DIAGNOSIS — J849 Interstitial pulmonary disease, unspecified: Secondary | ICD-10-CM

## 2024-04-18 NOTE — Progress Notes (Signed)
 Pulmonary Individual Treatment Plan  Patient Details  Name: Cindy [name] MRN: [id number] Date of Birth: 1932-12-16 Referring Provider:   Flowsheet Row PULMONARY REHAB OTHER RESP ORIENTATION from 03/06/2024 in [facility] CARDIAC REHABILITATION  Referring Provider Len Cools MD    Initial Encounter Date:  Flowsheet Row PULMONARY REHAB OTHER RESP ORIENTATION from 03/06/2024 in [facility] CARDIAC REHABILITATION  Date 03/06/24    Visit Diagnosis: Chronic respiratory failure with hypoxia (HCC)  Pulmonary HTN (HCC)  ILD (interstitial lung disease) (HCC)  Patient's Home Medications on Admission:  Current Medications[1]  Past Medical History: Past Medical History:  Diagnosis Date   Amyloidosis (HCC)    cancer   Cancer (HCC)    CHF (congestive heart failure) (HCC)    Amyloidosis   Diarrhea    Diverticulosis of colon (without mention of hemorrhage)    Edema    Esophagitis, unspecified    Family history of malignant neoplasm of gastrointestinal tract    Fibrosis of lung (HCC) 03/14/2013   Flatulence, eructation, and gas pain    GERD (gastroesophageal reflux disease)    Left sided ulcerative colitis (HCC)    Macular degeneration    left eye   Maxillary sinus mass    Osteoporosis, unspecified    Other and unspecified hyperlipidemia    Other and unspecified noninfectious gastroenteritis and colitis(558.9)    Paroxysmal atrial fibrillation (HCC) 07/09/2019   Peripheral vascular disease    Pneumonia    Stricture and stenosis of esophagus    Unspecified hypothyroidism    URI (upper respiratory infection)     Tobacco Use: Tobacco Use History[2]  Labs: Review Flowsheet  More data may exist      Latest Ref Rng & Units 07/15/2012 03/14/2013 06/29/2013 12/21/2013 04/06/2014  Labs for ITP Cardiac and Pulmonary Rehab  Cholestrol 0 - 200 mg/dL 778  - 799  757  798   LDL (calc) 0 - 99 mg/dL 845  - 859  816  852   HDL-C >39.00 mg/dL 50  - 49  56.29  60.69   Trlycerides  0.0 - 149.0 mg/dL 86  - 57  24.9  26.9   Hemoglobin A1c <5.7 % - 6.1  - - -    Capillary Blood Glucose: Lab Results  Component Value Date   GLUCAP 89 07/08/2019     Pulmonary Assessment Scores:  Pulmonary Assessment Scores     Row Name 03/06/24 1427         ADL UCSD   ADL Phase Entry     SOB Score total 82     Rest 2     Walk 3     Stairs 5     Bath 3     Dress 4     Shop 4       CAT Score   CAT Score 21       mMRC Score   mMRC Score 3       UCSD: Self-administered rating of dyspnea associated with activities of daily living (ADLs) 6-point scale (0 = not at all to 5 = maximal or unable to do because of breathlessness)  Scoring Scores range from 0 to 120.  Minimally important difference is 5 units  CAT: CAT can identify the health impairment of COPD patients and is better correlated with disease progression.  CAT has a scoring range of zero to 40. The CAT score is classified into four groups of low (less than 10), medium (10 - 20), high (21-30) and very  high (31-40) based on the impact level of disease on health status. A CAT score over 10 suggests significant symptoms.  A worsening CAT score could be explained by an exacerbation, poor medication adherence, poor inhaler technique, or progression of COPD or comorbid conditions.  CAT MCID is 2 points  mMRC: mMRC (Modified Medical Research Council) Dyspnea Scale is used to assess the degree of baseline functional disability in patients of respiratory disease due to dyspnea. No minimal important difference is established. A decrease in score of 1 point or greater is considered a positive change.   Pulmonary Function Assessment:  Pulmonary Function Assessment - 03/06/24 1527       Breath   Shortness of Breath Fear of Shortness of Breath;Yes;Limiting activity;Panic with Shortness of Breath          Exercise Target Goals: Exercise Program Goal: Individual exercise prescription set using results from initial 6  min walk test and THRR while considering  patients activity barriers and safety.   Exercise Prescription Goal: Initial exercise prescription builds to 30-45 minutes a day of aerobic activity, 2-3 days per week.  Home exercise guidelines will be given to patient during program as part of exercise prescription that the participant will acknowledge.  Activity Barriers & Risk Stratification:  Activity Barriers & Cardiac Risk Stratification - 02/23/24 1305       Activity Barriers & Cardiac Risk Stratification   Activity Barriers Shortness of Breath;Balance Concerns;Assistive Device;Deconditioning;Arthritis   Uses cane and walker when needed.         6 Minute Walk:  6 Minute Walk     Row Name 03/06/24 1521         6 Minute Walk   Phase Initial     Distance 190 feet     Walk Time 2 minutes     # of Rest Breaks 0  stopped due to desaturation     MPH 1.08     METS 0.48     RPE 17     Perceived Dyspnea  3     VO2 Peak 1.68     Symptoms Yes (comment)     Comments SOB, desaturation     Resting HR 82 bpm     Resting BP 100/62     Resting Oxygen  Saturation  94 %     Exercise Oxygen  Saturation  during 6 min walk 75 %     Max Ex. HR 96 bpm     Max Ex. BP 148/70     2 Minute Post BP 136/74       Interval HR   1 Minute HR 96     2 Minute HR 96     2 Minute Post HR 92     Interval Heart Rate? Yes       Interval Oxygen    Interval Oxygen ? Yes     Baseline Oxygen  Saturation % 94 %     1 Minute Oxygen  Saturation % 85 %     1 Minute Liters of Oxygen  2 L  NCP POC     2 Minute Oxygen  Saturation % 75 %     2 Minute Liters of Oxygen  2 L     2 Minute Post Oxygen  Saturation % 91 %     2 Minute Post Liters of Oxygen  2 L        Oxygen  Initial Assessment:  Oxygen  Initial Assessment - 03/06/24 1527       Home Oxygen    Home Oxygen  Device Home Concentrator;E-Tanks  Sleep Oxygen  Prescription Continuous    Liters per minute 2    Home Exercise Oxygen  Prescription Continuous     Liters per minute 2    Home Resting Oxygen  Prescription Continuous    Liters per minute 2    Compliance with Home Oxygen  Use Yes      Initial 6 min Walk   Oxygen  Used Portable Concentrator;Pulsed    Liters per minute 2      Program Oxygen  Prescription   Program Oxygen  Prescription Continuous;E-Tanks    Liters per minute 2      Intervention   Short Term Goals To learn and exhibit compliance with exercise, home and travel O2 prescription;To learn and understand importance of monitoring SPO2 with pulse oximeter and demonstrate accurate use of the pulse oximeter.;To learn and understand importance of maintaining oxygen  saturations>88%;To learn and demonstrate proper pursed lip breathing techniques or other breathing techniques. ;To learn and demonstrate proper use of respiratory medications    Long  Term Goals Exhibits compliance with exercise, home  and travel O2 prescription;Verbalizes importance of monitoring SPO2 with pulse oximeter and return demonstration;Maintenance of O2 saturations>88%;Exhibits proper breathing techniques, such as pursed lip breathing or other method taught during program session;Compliance with respiratory medication;Demonstrates proper use of MDIs          Oxygen  Re-Evaluation:  Oxygen  Re-Evaluation     Row Name 03/13/24 1110 04/10/24 1141           Program Oxygen  Prescription   Program Oxygen  Prescription -- Continuous;E-Tanks      Liters per minute -- 2        Home Oxygen    Home Oxygen  Device -- Home Concentrator;E-Tanks      Sleep Oxygen  Prescription -- Continuous      Liters per minute -- 2      Home Exercise Oxygen  Prescription -- Continuous      Liters per minute -- 2      Home Resting Oxygen  Prescription -- Continuous      Liters per minute -- 2      Compliance with Home Oxygen  Use -- Yes        Goals/Expected Outcomes   Short Term Goals -- To learn and exhibit compliance with exercise, home and travel O2 prescription;To learn and understand  importance of monitoring SPO2 with pulse oximeter and demonstrate accurate use of the pulse oximeter.;To learn and understand importance of maintaining oxygen  saturations>88%;To learn and demonstrate proper pursed lip breathing techniques or other breathing techniques. ;To learn and demonstrate proper use of respiratory medications      Long  Term Goals Maintenance of O2 saturations>88%;Exhibits compliance with exercise, home  and travel O2 prescription Maintenance of O2 saturations>88%;Exhibits compliance with exercise, home  and travel O2 prescription;Verbalizes importance of monitoring SPO2 with pulse oximeter and return demonstration;Exhibits proper breathing techniques, such as pursed lip breathing or other method taught during program session;Compliance with respiratory medication;Demonstrates proper use of MDIs      Comments Reviewed PLB technique with pt.  Talked about how it works and it's importance in maintaining their exercise saturations. Adean states she has good and bad days with her breathing. She gets SOB often, especially when she gets in a hurry. She wears 2L all the time, and may increase her portable concentrator to 3L due to her SOB.      Goals/Expected Outcomes Short: Become more profiecient at using PLB.   Long: Become independent at using PLB. Short: Become more profiecient at using PLB.   Long:  Become independent at using PLB.         Oxygen  Discharge (Final Oxygen  Re-Evaluation):  Oxygen  Re-Evaluation - 04/10/24 1141       Program Oxygen  Prescription   Program Oxygen  Prescription Continuous;E-Tanks    Liters per minute 2      Home Oxygen    Home Oxygen  Device Home Concentrator;E-Tanks    Sleep Oxygen  Prescription Continuous    Liters per minute 2    Home Exercise Oxygen  Prescription Continuous    Liters per minute 2    Home Resting Oxygen  Prescription Continuous    Liters per minute 2    Compliance with Home Oxygen  Use Yes      Goals/Expected Outcomes   Short Term  Goals To learn and exhibit compliance with exercise, home and travel O2 prescription;To learn and understand importance of monitoring SPO2 with pulse oximeter and demonstrate accurate use of the pulse oximeter.;To learn and understand importance of maintaining oxygen  saturations>88%;To learn and demonstrate proper pursed lip breathing techniques or other breathing techniques. ;To learn and demonstrate proper use of respiratory medications    Long  Term Goals Maintenance of O2 saturations>88%;Exhibits compliance with exercise, home  and travel O2 prescription;Verbalizes importance of monitoring SPO2 with pulse oximeter and return demonstration;Exhibits proper breathing techniques, such as pursed lip breathing or other method taught during program session;Compliance with respiratory medication;Demonstrates proper use of MDIs    Comments Adean states she has good and bad days with her breathing. She gets SOB often, especially when she gets in a hurry. She wears 2L all the time, and may increase her portable concentrator to 3L due to her SOB.    Goals/Expected Outcomes Short: Become more profiecient at using PLB.   Long: Become independent at using PLB.          Initial Exercise Prescription:  Initial Exercise Prescription - 03/06/24 1500       Date of Initial Exercise RX and Referring Provider   Date 03/06/24    Referring Provider Algharnim, Paula MD      Oxygen    Oxygen  Continuous    Liters 2    Maintain Oxygen  Saturation 88% or higher      NuStep   Level 1    SPM 60    Minutes 15    METs 1.5      REL-XR   Level 1    Speed 50    Minutes 15    METs 1.5      Prescription Details   Frequency (times per week) 2    Duration Progress to 30 minutes of continuous aerobic without signs/symptoms of physical distress      Intensity   THRR 40-80% of Max Heartrate 105-122    Ratings of Perceived Exertion 11-13    Perceived Dyspnea 0-4      Progression   Progression Continue to progress  workloads to maintain intensity without signs/symptoms of physical distress.      Resistance Training   Training Prescription Yes    Weight 3 lb    Reps 10-15          Perform Capillary Blood Glucose checks as needed.  Exercise Prescription Changes:   Exercise Prescription Changes     Row Name 03/06/24 1500 04/05/24 1500           Response to Exercise   Blood Pressure (Admit) 100/62 142/66      Blood Pressure (Exercise) 148/70 --      Blood Pressure (Exit) 126/64 110/62  Heart Rate (Admit) 82 bpm 78 bpm      Heart Rate (Exercise) 96 bpm 80 bpm      Heart Rate (Exit) 92 bpm 65 bpm      Oxygen  Saturation (Admit) 94 % 88 %      Oxygen  Saturation (Exercise) 75 % 90 %      Oxygen  Saturation (Exit) 91 % 97 %      Rating of Perceived Exertion (Exercise) 17 15      Perceived Dyspnea (Exercise) 3 2      Symptoms SOb, destaruation --      Comments walk test results --      Duration -- Continue with 30 min of aerobic exercise without signs/symptoms of physical distress.      Intensity -- THRR unchanged        Progression   Progression -- Continue to progress workloads to maintain intensity without signs/symptoms of physical distress.        Resistance Training   Weight -- 3      Reps -- 10-15        Oxygen    Oxygen  -- Continuous      Liters -- 2        NuStep   Level -- 1      SPM -- 60      Minutes -- 35      METs -- 1.5        Oxygen    Maintain Oxygen  Saturation -- 88% or higher         Exercise Comments:   Exercise Comments     Row Name 03/06/24 1426 03/13/24 1108         Exercise Comments Patient attend orientation today.  Patient is attending Pulmonary Rehabilitation Program.  Documentation for diagnosis can be found in [facility].  Reviewed medical chart, RPE/RPD, gym safety, and program guidelines.  Patient was fitted to equipment they will be using during rehab.  Patient is scheduled to start exercise on 03/13/24.   Initial ITP created and sent for  review and signature by Dr. Anton Kelp, Medical Director for Pulmonary Rehabilitation Program. First full day of exercise!  Patient was oriented to gym and equipment including functions, settings, policies, and procedures.  Patient's individual exercise prescription and treatment plan were reviewed.  All starting workloads were established based on the results of the 6 minute walk test done at initial orientation visit.  The plan for exercise progression was also introduced and progression will be customized based on patient's performance and goals.         Exercise Goals and Review:   Exercise Goals     Row Name 03/06/24 1524             Exercise Goals   Increase Physical Activity Yes       Intervention Provide advice, education, support and counseling about physical activity/exercise needs.;Develop an individualized exercise prescription for aerobic and resistive training based on initial evaluation findings, risk stratification, comorbidities and participant's personal goals.       Expected Outcomes Short Term: Attend rehab on a regular basis to increase amount of physical activity.;Long Term: Exercising regularly at least 3-5 days a week.;Long Term: Add in home exercise to make exercise part of routine and to increase amount of physical activity.       Increase Strength and Stamina Yes       Intervention Provide advice, education, support and counseling about physical activity/exercise needs.;Develop an individualized exercise prescription for aerobic and  resistive training based on initial evaluation findings, risk stratification, comorbidities and participant's personal goals.       Expected Outcomes Short Term: Increase workloads from initial exercise prescription for resistance, speed, and METs.;Short Term: Perform resistance training exercises routinely during rehab and add in resistance training at home;Long Term: Improve cardiorespiratory fitness, muscular endurance and strength as  measured by increased METs and functional capacity ( )       Able to understand and use rate of perceived exertion (RPE) scale Yes       Intervention Provide education and explanation on how to use RPE scale       Expected Outcomes Long Term:  Able to use RPE to guide intensity level when exercising independently;Short Term: Able to use RPE daily in rehab to express subjective intensity level       Able to understand and use Dyspnea scale Yes       Intervention Provide education and explanation on how to use Dyspnea scale       Expected Outcomes Short Term: Able to use Dyspnea scale daily in rehab to express subjective sense of shortness of breath during exertion;Long Term: Able to use Dyspnea scale to guide intensity level when exercising independently       Knowledge and understanding of Target Heart Rate Range (THRR) Yes       Intervention Provide education and explanation of THRR including how the numbers were predicted and where they are located for reference       Expected Outcomes Short Term: Able to state/look up THRR;Long Term: Able to use THRR to govern intensity when exercising independently;Short Term: Able to use daily as guideline for intensity in rehab       Able to check pulse independently Yes       Intervention Provide education and demonstration on how to check pulse in carotid and radial arteries.;Review the importance of being able to check your own pulse for safety during independent exercise       Expected Outcomes Long Term: Able to check pulse independently and accurately;Short Term: Able to explain why pulse checking is important during independent exercise       Understanding of Exercise Prescription Yes       Intervention Provide education, explanation, and written materials on patient's individual exercise prescription       Expected Outcomes Long Term: Able to explain home exercise prescription to exercise independently;Short Term: Able to explain program exercise  prescription          Exercise Goals Re-Evaluation :  Exercise Goals Re-Evaluation     Row Name 03/13/24 1109 04/10/24 1138           Exercise Goal Re-Evaluation   Exercise Goals Review Able to understand and use rate of perceived exertion (RPE) scale;Knowledge and understanding of Target Heart Rate Range (THRR) Increase Physical Activity;Increase Strength and Stamina;Able to understand and use Dyspnea scale;Able to understand and use rate of perceived exertion (RPE) scale;Knowledge and understanding of Target Heart Rate Range (THRR);Able to check pulse independently;Understanding of Exercise Prescription      Comments Reviewed RPE and dyspnea scale, THR and program prescription with pt today.  Pt voiced understanding and was given a copy of goals to take home. Adean states she doesn't do any exercise at home. She is is independent with her ADL's for her age and still drives. She comes here two days a week, she wants to change to the afternoon class because she is not much of a morning  person.      Expected Outcomes Short: Use RPE daily to regulate intensity.  Long: Follow program prescription in THR. Short: Continue to attend rehab. Long: Try to incorprate exercise at home, but stay as active and independent as she can.         Discharge Exercise Prescription (Final Exercise Prescription Changes):  Exercise Prescription Changes - 04/05/24 1500       Response to Exercise   Blood Pressure (Admit) 142/66    Blood Pressure (Exit) 110/62    Heart Rate (Admit) 78 bpm    Heart Rate (Exercise) 80 bpm    Heart Rate (Exit) 65 bpm    Oxygen  Saturation (Admit) 88 %    Oxygen  Saturation (Exercise) 90 %    Oxygen  Saturation (Exit) 97 %    Rating of Perceived Exertion (Exercise) 15    Perceived Dyspnea (Exercise) 2    Duration Continue with 30 min of aerobic exercise without signs/symptoms of physical distress.    Intensity THRR unchanged      Progression   Progression Continue to progress  workloads to maintain intensity without signs/symptoms of physical distress.      Resistance Training   Weight 3    Reps 10-15      Oxygen    Oxygen  Continuous    Liters 2      NuStep   Level 1    SPM 60    Minutes 35    METs 1.5      Oxygen    Maintain Oxygen  Saturation 88% or higher          Nutrition:  Target Goals: Understanding of nutrition guidelines, daily intake of sodium 1500mg , cholesterol 200mg , calories 30% from fat and 7% or less from saturated fats, daily to have 5 or more servings of fruits and vegetables.  Biometrics:  Pre Biometrics - 03/06/24 1525       Pre Biometrics   Height 5' 7 (1.702 m)    Weight 123 lb 14.4 oz (56.2 kg)    Waist Circumference 29.5 inches    Hip Circumference 36 inches    Waist to Hip Ratio 0.82 %    BMI (Calculated) 19.4    Grip Strength 9.8 kg    Single Leg Stand 1.2 seconds           Nutrition Therapy Plan and Nutrition Goals:  Nutrition Therapy & Goals - 03/06/24 1526       Intervention Plan   Intervention Prescribe, educate and counsel regarding individualized specific dietary modifications aiming towards targeted core components such as weight, hypertension, lipid management, diabetes, heart failure and other comorbidities.;Nutrition handout(s) given to patient.    Expected Outcomes Short Term Goal: Understand basic principles of dietary content, such as calories, fat, sodium, cholesterol and nutrients.;Long Term Goal: Adherence to prescribed nutrition plan.          Nutrition Assessments:  MEDIFICTS Score Key: >=70 Need to make dietary changes  40-70 Heart Healthy Diet <= 40 Therapeutic Level Cholesterol Diet   Picture Your Plate Scores: <59 Unhealthy dietary pattern with much room for improvement. 41-50 Dietary pattern unlikely to meet recommendations for good health and room for improvement. 51-60 More healthful dietary pattern, with some room for improvement.  >60 Healthy dietary pattern, although  there may be some specific behaviors that could be improved.    Nutrition Goals Re-Evaluation:  Nutrition Goals Re-Evaluation     Row Name 04/10/24 [phone]  Goals   Nutrition Goal Increase protein intake.       Comment Adean is doing well in rehab. She states she doesn't have much of an appetite. She eats about 1 good meal per day, and the rest of the day she snacks. She does try to drink an ensure per day also. She keeps a bottle of water with her at all times. She has to be careful with her water intake due to her heart and lung issues.       Expected Outcome Short: Continue to increase protein intake. Long: Keep adequate water intake.          Nutrition Goals Discharge (Final Nutrition Goals Re-Evaluation):  Nutrition Goals Re-Evaluation - 04/10/24 1133       Goals   Nutrition Goal Increase protein intake.    Comment Adean is doing well in rehab. She states she doesn't have much of an appetite. She eats about 1 good meal per day, and the rest of the day she snacks. She does try to drink an ensure per day also. She keeps a bottle of water with her at all times. She has to be careful with her water intake due to her heart and lung issues.    Expected Outcome Short: Continue to increase protein intake. Long: Keep adequate water intake.          Psychosocial: Target Goals: Acknowledge presence or absence of significant depression and/or stress, maximize coping skills, provide positive support system. Participant is able to verbalize types and ability to use techniques and skills needed for reducing stress and depression.  Initial Review & Psychosocial Screening:  Initial Psych Review & Screening - 02/23/24 1334       Initial Review   Current issues with None Identified      Family Dynamics   Good Support System? Yes    Comments Patient has a large extended family that supports her.      Barriers   Psychosocial barriers to participate in program The patient should  benefit from training in stress management and relaxation.;There are no identifiable barriers or psychosocial needs.      Screening Interventions   Interventions Encouraged to exercise;To provide support and resources with identified psychosocial needs;Provide feedback about the scores to participant    Expected Outcomes Short Term goal: Utilizing psychosocial counselor, staff and physician to assist with identification of specific Stressors or current issues interfering with healing process. Setting desired goal for each stressor or current issue identified.;Long Term Goal: Stressors or current issues are controlled or eliminated.;Short Term goal: Identification and review with participant of any Quality of Life or Depression concerns found by scoring the questionnaire.;Long Term goal: The participant improves quality of Life and PHQ9 Scores as seen by post scores and/or verbalization of changes          Quality of Life Scores:  Scores of 19 and below usually indicate a poorer quality of life in these areas.  A difference of  2-3 points is a clinically meaningful difference.  A difference of 2-3 points in the total score of the Quality of Life Index has been associated with significant improvement in overall quality of life, self-image, physical symptoms, and general health in studies assessing change in quality of life.   PHQ-9: Review Flowsheet       03/06/2024 01/19/2014 01/05/2014 05/18/2013  Depression screen PHQ 2/9  Decreased Interest 0 0 0 0  Down, Depressed, Hopeless 0 0 0 0  PHQ - 2 Score  0 0 0 0  Altered sleeping 0 - - -  Tired, decreased energy 3 - - -  Change in appetite 3 - - -  Feeling bad or failure about yourself  0 - - -  Trouble concentrating 0 - - -  Moving slowly or fidgety/restless 0 - - -  Suicidal thoughts 0 - - -  PHQ-9 Score 6 - - -  Difficult doing work/chores Somewhat difficult - - -   Interpretation of Total Score  Total Score Depression Severity:  1-4 =  Minimal depression, 5-9 = Mild depression, 10-14 = Moderate depression, 15-19 = Moderately severe depression, 20-27 = Severe depression   Psychosocial Evaluation and Intervention:  Psychosocial Evaluation - 02/23/24 1335       Psychosocial Evaluation & Interventions   Interventions Stress management education;Relaxation education;Encouraged to exercise with the program and follow exercise prescription    Comments Patient was referred to [facility] with chronic respiratory failure with hypoxia; pulmonary HTN and ILD. She denies any depression or anxiety. She says she sleeps well at night using Alprazalam. Her grandson lives in her basement but she says she rarely sees him, but he is there if she needs him. 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 She is looking forward to participating in the program. Her goals are to get stronger in her legs and feet and to improve her confidence in her ability to do things. She has no barriers identified to complete the program.    Expected Outcomes Short Term: Patient will start the program and attend consistently. Long Term: Patient will complete the program meeting personal goals.    Continue Psychosocial Services  Follow up required by staff          Psychosocial Re-Evaluation:  Psychosocial Re-Evaluation     Row Name 04/10/24 1130             Psychosocial Re-Evaluation   Current issues with None Identified       Comments Adean identifies no current stressors in her life at the moment. She is sleeps well also.       Expected Outcomes Short: Continue to attend rehab. Long: Continue to have stress free lifestyle.       Interventions Encouraged to attend Pulmonary Rehabilitation for the exercise       Continue Psychosocial Services  Follow up required by staff          Psychosocial Discharge (Final Psychosocial Re-Evaluation):   Psychosocial Re-Evaluation - 04/10/24 1130       Psychosocial Re-Evaluation   Current issues with None Identified    Comments Adean identifies no current stressors in her life at the moment. She is sleeps well also.    Expected Outcomes Short: Continue to attend rehab. Long: Continue to have stress free lifestyle.    Interventions Encouraged to attend Pulmonary Rehabilitation for the exercise    Continue Psychosocial Services  Follow up required by staff           Education: Education Goals: Education classes will be provided on a weekly basis, covering required topics. Participant will state understanding/return demonstration of topics presented.  Learning Barriers/Preferences:  Learning Barriers/Preferences - 02/23/24 1318       Learning Barriers/Preferences   Learning Barriers None    Learning Preferences Audio;Skilled Demonstration          Education Topics:  How Lungs Work and Diseases: - Discuss the anatomy of the lungs and diseases that can affect the lungs, such as COPD.   Exercise: -Discuss the importance of exercise, FITT principles of exercise, normal and abnormal responses to exercise, and how to exercise safely.   Environmental Irritants: -Discuss types of environmental irritants and how to limit exposure to environmental irritants.   Meds/Inhalers and oxygen : - Discuss respiratory medications, definition of an inhaler and oxygen , and the proper way to use an inhaler and oxygen .   Energy Saving Techniques: - Discuss methods to conserve energy and decrease shortness of breath when performing activities of daily living.    Bronchial Hygiene / Breathing Techniques: - Discuss breathing mechanics, pursed-lip breathing technique,  proper posture, effective ways to clear airways, and other functional breathing techniques   Cleaning Equipment: - Provides group verbal and written instruction about the health risks of elevated stress, cause of high stress,  and healthy ways to reduce stress.   Nutrition I: Fats: - Discuss the types of cholesterol, what cholesterol does to the body, and how cholesterol levels can be controlled.   Nutrition II: Labels: -Discuss the different components of food labels and how to read food labels.   Respiratory Infections: - Discuss the signs and symptoms of respiratory infections, ways to prevent respiratory infections, and the importance of seeking medical treatment when having a respiratory infection.   Stress I: Signs and Symptoms: - Discuss the causes of stress, how stress may lead to anxiety and depression, and ways to limit stress.   Stress II: Relaxation: -Discuss relaxation techniques to limit stress.   Oxygen  for Home/Travel: - Discuss how to prepare for travel when on oxygen  and proper ways to transport and store oxygen  to ensure safety.   Knowledge Questionnaire Score:  Knowledge Questionnaire Score - 03/06/24 1427       Knowledge Questionnaire Score   Pre Score 15/18          Core Components/Risk Factors/Patient Goals at Admission:  Personal Goals and Risk Factors at Admission - 03/06/24 1526       Core Components/Risk Factors/Patient Goals on Admission    Weight Management Weight Maintenance;Yes;Weight Gain    Intervention Weight Management: Develop a combined nutrition and exercise program designed to reach desired caloric intake, while maintaining appropriate intake of nutrient and fiber, sodium and fats, and appropriate energy expenditure required for the weight goal.;Weight Management: Provide education and appropriate resources to help participant work on and attain dietary goals.    Admit Weight 123 lb 14.4 oz (56.2 kg)    Goal Weight: Short Term 125 lb (56.7 kg)    Goal Weight: Long Term 125 lb (56.7 kg)    Expected Outcomes Short Term: Continue to assess and modify interventions until short term weight is achieved;Long Term: Adherence to nutrition and physical  activity/exercise program aimed toward attainment of established weight goal;Weight Maintenance: Understanding of the daily nutrition guidelines, which includes 25-35% calories from fat, 7% or less cal from saturated fats, less than 200mg  cholesterol, less than 1.5gm of sodium, & 5 or more servings of fruits and vegetables daily;Weight Gain: Understanding of general recommendations for a high calorie, high protein meal plan that promotes weight gain by distributing calorie intake throughout the day with the consumption for 4-5 meals, snacks, and/or supplements    Improve shortness of breath with ADL's Yes    Intervention Provide education, individualized exercise plan and daily activity instruction to help decrease symptoms of SOB with activities of daily  living.    Expected Outcomes Short Term: Improve cardiorespiratory fitness to achieve a reduction of symptoms when performing ADLs;Long Term: Be able to perform more ADLs without symptoms or delay the onset of symptoms    Increase knowledge of respiratory medications and ability to use respiratory devices properly  Yes    Intervention Provide education and demonstration as needed of appropriate use of medications, inhalers, and oxygen  therapy.    Expected Outcomes Short Term: Achieves understanding of medications use. Understands that oxygen  is a medication prescribed by physician. Demonstrates appropriate use of inhaler and oxygen  therapy.;Long Term: Maintain appropriate use of medications, inhalers, and oxygen  therapy.    Hypertension Yes    Intervention Provide education on lifestyle modifcations including regular physical activity/exercise, weight management, moderate sodium restriction and increased consumption of fresh fruit, vegetables, and low fat dairy, alcohol  moderation, and smoking cessation.;Monitor prescription use compliance.    Expected Outcomes Short Term: Continued assessment and intervention until BP is < 140/43mm HG in hypertensive  participants. < 130/67mm HG in hypertensive participants with diabetes, heart failure or chronic kidney disease.;Long Term: Maintenance of blood pressure at goal levels.          Core Components/Risk Factors/Patient Goals Review:   Goals and Risk Factor Review     Row Name 04/10/24 1136             Core Components/Risk Factors/Patient Goals Review   Personal Goals Review Lipids;Heart Failure;Develop more efficient breathing techniques such as purse lipped breathing and diaphragmatic breathing and practicing self-pacing with activity.;Increase knowledge of respiratory medications and ability to use respiratory devices properly.;Improve shortness of breath with ADL's       Review Adean is doing well in rehab. She takes all her medicines as prescribed. She wears her 2L of oxygen  all the time, even at night. She checks her BP at home as well.       Expected Outcomes Short: Continue to attend rehab. Long: Continue checking vitals at home and report any abnormalities.          Core Components/Risk Factors/Patient Goals at Discharge (Final Review):   Goals and Risk Factor Review - 04/10/24 1136       Core Components/Risk Factors/Patient Goals Review   Personal Goals Review Lipids;Heart Failure;Develop more efficient breathing techniques such as purse lipped breathing and diaphragmatic breathing and practicing self-pacing with activity.;Increase knowledge of respiratory medications and ability to use respiratory devices properly.;Improve shortness of breath with ADL's    Review Adean is doing well in rehab. She takes all her medicines as prescribed. She wears her 2L of oxygen  all the time, even at night. She checks her BP at home as well.    Expected Outcomes Short: Continue to attend rehab. Long: Continue checking vitals at home and report any abnormalities.          ITP Comments:  ITP Comments     Row Name 02/23/24 1341 03/06/24 1425 03/13/24 1108 03/21/24 0943 04/18/24 1318   ITP  Comments Virtual orientation visit completed for pulmonary rehab with chronic respiratory failure/pulmonary HTN and ILD. On-site orientation visit scheduled for 02/28/24 at 2:15. Patient attend orientation today.  Patient is attending Pulmonary Rehabilitation Program.  Documentation for diagnosis can be found in [facility].  Reviewed medical chart, RPE/RPD, gym safety, and program guidelines.  Patient was fitted to equipment they will be using during rehab.  Patient is scheduled to start exercise on 03/13/24.   Initial ITP created and sent for review and signature by Dr.  Anton Kelp, Medical Director for Pulmonary Rehabilitation Program. First full day of exercise!  Patient was oriented to gym and equipment including functions, settings, policies, and procedures.  Patient's individual exercise prescription and treatment plan were reviewed.  All starting workloads were established based on the results of the 6 minute walk test done at initial orientation visit.  The plan for exercise progression was also introduced and progression will be customized based on patient's performance and goals. 30 day review completed. ITP sent to Dr.Jehanzeb Memon, Medical Director of  Pulmonary Rehab. Continue with ITP unless changes are made by physician.  Still new to program 30 day review completed. ITP sent to Dr.Jehanzeb Memon, Medical Director of  Pulmonary Rehab. Continue with ITP unless changes are made by physician.      Comments: 30 day review      [1]  Current Outpatient Medications:    acetaminophen  (TYLENOL ) 650 MG CR tablet, Take 650 mg by mouth every 8 (eight) hours as needed for pain or fever., Disp: , Rfl:    albuterol  (PROVENTIL ) (2.5 MG/3ML) 0.083% nebulizer solution, Take 3 mLs (2.5 mg total) by nebulization every 6 (six) hours as needed for wheezing or shortness of breath., Disp: 75 mL, Rfl: 5   albuterol  (PROVENTIL ) (2.5 MG/3ML) 0.083% nebulizer solution, Take 3 mLs (2.5 mg total) by nebulization every 6  (six) hours as needed for wheezing or shortness of breath. 2-3 times a week, Disp: 75 mL, Rfl: 0   ALPRAZolam  (XANAX ) 0.5 MG tablet, Take 1 tablet (0.5 mg total) by mouth at bedtime as needed for anxiety., Disp: 30 tablet, Rfl: 3   amoxicillin (AMOXIL) 875 MG tablet, Take 875 mg by mouth 2 (two) times daily. (Patient not taking: Reported on 03/21/2024), Disp: , Rfl:    apixaban  (ELIQUIS ) 2.5 MG TABS tablet, Take 1 tablet (2.5 mg total) by mouth 2 (two) times daily., Disp: 180 tablet, Rfl: 1   BIOTIN PO, Take 1 tablet by mouth daily. (Patient not taking: Reported on 03/21/2024), Disp: , Rfl:    Cholecalciferol  50 MCG (2000 UT) CAPS, Take 1 capsule by mouth daily., Disp: , Rfl:    clobetasol (TEMOVATE) 0.05 % external solution, Apply 1 Application topically 2 (two) times daily., Disp: , Rfl:    cyanocobalamin (VITAMIN B12) 1000 MCG tablet, Take 1,000 mcg by mouth daily., Disp: , Rfl:    FARXIGA  10 MG TABS tablet, TAKE 1 TABLET BY MOUTH DAILY, Disp: 90 tablet, Rfl: 3   fentaNYL  (DURAGESIC ) 25 MCG/HR, Place 1 patch onto the skin every 3 (three) days., Disp: , Rfl:    Fluocinolone Acetonide 0.01 % OIL, Place 5 drops into both ears as needed (itching). As needed for itching, Disp: , Rfl:    levothyroxine  (SYNTHROID ) 137 MCG tablet, Take 137 mcg by mouth daily., Disp: , Rfl:    metoprolol  succinate (TOPROL -XL) 25 MG 24 hr tablet, Take 0.5-1 tablets (12.5-25 mg total) by mouth in the morning and at bedtime. Patient takes 1 tablet (25mg ) in the morning and 1/2 of a tablet (12.5mg ) in the evening (Patient taking differently: Take 25 mg by mouth daily. Takes 1/2), Disp: , Rfl:    Multiple Vitamins-Minerals (PRESERVISION AREDS PO), Take 1 capsule by mouth daily., Disp: , Rfl:    NYAMYC powder, Apply 1 Application topically 2 (two) times daily., Disp: , Rfl:    ondansetron  (ZOFRAN ) 4 MG tablet, Take 4 mg by mouth every 8 (eight) hours as needed for nausea or vomiting., Disp: , Rfl:  Polyethyl Glycol-Propyl Glycol  (SYSTANE OP), Apply 1 drop to eye daily., Disp: , Rfl:    potassium chloride  (KLOR-CON  M) 10 MEQ tablet, Take 10 mEq by mouth daily., Disp: , Rfl:    Probiotic Product (ALIGN) 4 MG CAPS, Take 1 tablet by mouth every morning. (Patient not taking: Reported on 03/21/2024), Disp: , Rfl:    senna (SENOKOT) 8.6 MG tablet, Take 2 tablets by mouth as needed for constipation., Disp: , Rfl:    sodium chloride  (OCEAN) 0.65 % nasal spray, Place 2 sprays into the nose daily as needed for congestion., Disp: , Rfl:    torsemide  (DEMADEX ) 20 MG tablet, Take 20 mg (1 tablet) daily alternating with 40 mg (2 tablets), Disp: , Rfl:  [2]  Social History Tobacco Use  Smoking Status Never   Passive exposure: Never  Smokeless Tobacco Never

## 2024-04-19 ENCOUNTER — Encounter (HOSPITAL_COMMUNITY)
Admission: RE | Admit: 2024-04-19 | Discharge: 2024-04-19 | Disposition: A | Discharge status: Home / Self Care | Source: Ambulatory Visit | Attending: Pulmonary Disease | Admitting: Pulmonary Disease

## 2024-04-19 DIAGNOSIS — J9611 Chronic respiratory failure with hypoxia: Secondary | ICD-10-CM | POA: Diagnosis not present

## 2024-04-19 DIAGNOSIS — I272 Pulmonary hypertension, unspecified: Secondary | ICD-10-CM

## 2024-04-19 DIAGNOSIS — J849 Interstitial pulmonary disease, unspecified: Secondary | ICD-10-CM

## 2024-04-19 NOTE — Progress Notes (Signed)
 Daily Session Note  Patient Details  Name: Tamecka Milham MRN: [id number] Date of Birth: 08-06-1932 Referring Provider:   Flowsheet Row PULMONARY REHAB OTHER RESP ORIENTATION from 03/06/2024 in [facility] CARD[facility]AC RE[facility]ITATION  Referring Provider Len Cools MD    Encounter Date: 04/19/2024  Check In:  Session Check In - 04/19/24 1415       Check-In   Supervising physician immediately available to respond to emergencies See telemetry face sheet for immediately available MD    Location [facility]    Staff Present Laymon Rattler, BSN, RN, WTA-C;Heather Con, BS, Exercise Physiologist    Virtual Visit No    Medication changes reported     No    Fall or balance concerns reported    No    Warm-up and Cool-down Performed on first and last piece of equipment    Resistance Training Performed Yes    VAD Patient? No    PAD/SET Patient? No      Pain Assessment   Currently in Pain? No/denies          Capillary Blood Glucose: No results found for this or any previous visit (from the past 24 hours).    Tobacco Use History[1]  Goals Met:  Proper associated with RPD/PD & O2 Sat Independence with exercise equipment Improved SOB with ADL's Using PLB without cueing & demonstrates good technique Exercise tolerated well Personal goals reviewed No report of concerns or symptoms today Strength training completed today  Goals Unmet:  Not Applicable  Comments: Pt able to follow exercise prescription today without complaint.  Will continue to monitor for progression.        [1]  Social History Tobacco Use  Smoking Status Never   Passive exposure: Never  Smokeless Tobacco Never

## 2024-04-22 ENCOUNTER — Emergency Department (HOSPITAL_COMMUNITY)

## 2024-04-22 ENCOUNTER — Encounter (HOSPITAL_COMMUNITY): Payer: Self-pay | Admitting: *Deleted

## 2024-04-22 ENCOUNTER — Observation Stay (HOSPITAL_COMMUNITY)
Admission: EM | Admit: 2024-04-22 | Discharge: 2024-04-23 | Disposition: A | Attending: Internal Medicine | Admitting: Internal Medicine

## 2024-04-22 DIAGNOSIS — Z79899 Other long term (current) drug therapy: Secondary | ICD-10-CM | POA: Diagnosis not present

## 2024-04-22 DIAGNOSIS — I700 Atherosclerosis of aorta: Secondary | ICD-10-CM | POA: Diagnosis not present

## 2024-04-22 DIAGNOSIS — E039 Hypothyroidism, unspecified: Secondary | ICD-10-CM | POA: Insufficient documentation

## 2024-04-22 DIAGNOSIS — J90 Pleural effusion, not elsewhere classified: Secondary | ICD-10-CM | POA: Insufficient documentation

## 2024-04-22 DIAGNOSIS — I110 Hypertensive heart disease with heart failure: Secondary | ICD-10-CM | POA: Insufficient documentation

## 2024-04-22 DIAGNOSIS — J849 Interstitial pulmonary disease, unspecified: Secondary | ICD-10-CM | POA: Insufficient documentation

## 2024-04-22 DIAGNOSIS — Z86711 Personal history of pulmonary embolism: Secondary | ICD-10-CM | POA: Diagnosis not present

## 2024-04-22 DIAGNOSIS — R079 Chest pain, unspecified: Secondary | ICD-10-CM

## 2024-04-22 DIAGNOSIS — I4430 Unspecified atrioventricular block: Secondary | ICD-10-CM | POA: Diagnosis not present

## 2024-04-22 DIAGNOSIS — J9611 Chronic respiratory failure with hypoxia: Secondary | ICD-10-CM | POA: Insufficient documentation

## 2024-04-22 DIAGNOSIS — Z959 Presence of cardiac and vascular implant and graft, unspecified: Secondary | ICD-10-CM | POA: Diagnosis not present

## 2024-04-22 DIAGNOSIS — I5032 Chronic diastolic (congestive) heart failure: Secondary | ICD-10-CM | POA: Diagnosis not present

## 2024-04-22 DIAGNOSIS — I4819 Other persistent atrial fibrillation: Secondary | ICD-10-CM | POA: Diagnosis not present

## 2024-04-22 DIAGNOSIS — R0789 Other chest pain: Principal | ICD-10-CM | POA: Diagnosis present

## 2024-04-22 DIAGNOSIS — R2681 Unsteadiness on feet: Secondary | ICD-10-CM | POA: Diagnosis not present

## 2024-04-22 DIAGNOSIS — M6281 Muscle weakness (generalized): Secondary | ICD-10-CM | POA: Diagnosis not present

## 2024-04-22 DIAGNOSIS — Z7901 Long term (current) use of anticoagulants: Secondary | ICD-10-CM | POA: Insufficient documentation

## 2024-04-22 DIAGNOSIS — I2510 Atherosclerotic heart disease of native coronary artery without angina pectoris: Secondary | ICD-10-CM | POA: Diagnosis not present

## 2024-04-22 LAB — BASIC METABOLIC PANEL WITH GFR
Anion gap: 18 — ABNORMAL HIGH (ref 5–15)
BUN: 18 mg/dL (ref 8–23)
CO2: 23 mmol/L (ref 22–32)
Calcium: 9.3 mg/dL (ref 8.9–10.3)
Chloride: 97 mmol/L — ABNORMAL LOW (ref 98–111)
Creatinine, Ser: 0.85 mg/dL (ref 0.44–1.00)
GFR, Estimated: >60 mL/min
Glucose, Bld: 86 mg/dL (ref 70–99)
Potassium: 4.1 mmol/L (ref 3.5–5.1)
Sodium: 139 mmol/L (ref 135–145)

## 2024-04-22 LAB — HEPATIC FUNCTION PANEL
ALT: 6 U/L (ref 0–44)
AST: 23 U/L (ref 15–41)
Albumin: 4 g/dL (ref 3.5–5.0)
Alkaline Phosphatase: 141 U/L — ABNORMAL HIGH (ref 38–126)
Bilirubin, Direct: 0.7 mg/dL — ABNORMAL HIGH (ref 0.0–0.2)
Indirect Bilirubin: 0.7 mg/dL (ref 0.3–0.9)
Total Bilirubin: 1.4 mg/dL — ABNORMAL HIGH (ref 0.0–1.2)
Total Protein: 7.3 g/dL (ref 6.5–8.1)

## 2024-04-22 LAB — CBC
HCT: 43.6 % (ref 36.0–46.0)
Hemoglobin: 13.8 g/dL (ref 12.0–15.0)
MCH: 32.1 pg (ref 26.0–34.0)
MCHC: 31.7 g/dL (ref 30.0–36.0)
MCV: 101.4 fL — ABNORMAL HIGH (ref 80.0–100.0)
Platelets: 173 K/uL (ref 150–400)
RBC: 4.3 MIL/uL (ref 3.87–5.11)
RDW: 15.1 % (ref 11.5–15.5)
WBC: 6 K/uL (ref 4.0–10.5)
nRBC: 0 % (ref 0.0–0.2)

## 2024-04-22 LAB — TROPONIN T, HIGH SENSITIVITY
Troponin T High Sensitivity: <15 ng/L (ref 0–19)
Troponin T High Sensitivity: <15 ng/L (ref 0–19)

## 2024-04-22 LAB — PRO BRAIN NATRIURETIC PEPTIDE: Pro Brain Natriuretic Peptide: 597 pg/mL — ABNORMAL HIGH

## 2024-04-22 MED ORDER — OXYCODONE-ACETAMINOPHEN 5-325 MG PO TABS
1.0000 | ORAL_TABLET | Freq: Once | ORAL | Status: AC
  Administered 2024-04-22: 1 via ORAL
  Filled 2024-04-22: qty 1

## 2024-04-22 MED ORDER — POTASSIUM CHLORIDE CRYS ER 10 MEQ PO TBCR
10.0000 meq | EXTENDED_RELEASE_TABLET | Freq: Every day | ORAL | Status: DC
  Administered 2024-04-22 – 2024-04-23 (×2): 10 meq via ORAL
  Filled 2024-04-22 (×2): qty 1

## 2024-04-22 MED ORDER — APIXABAN 2.5 MG PO TABS
2.5000 mg | ORAL_TABLET | Freq: Two times a day (BID) | ORAL | Status: DC
  Administered 2024-04-23: 2.5 mg via ORAL
  Filled 2024-04-22: qty 1

## 2024-04-22 MED ORDER — POLYETHYLENE GLYCOL 3350 17 G PO PACK: 17.0000 g | PACK | Freq: Every day | ORAL | Status: DC | PRN

## 2024-04-22 MED ORDER — MELATONIN 3 MG PO TABS
6.0000 mg | ORAL_TABLET | Freq: Every evening | ORAL | Status: DC | PRN
  Administered 2024-04-22: 6 mg via ORAL
  Filled 2024-04-22: qty 2

## 2024-04-22 MED ORDER — ACETAMINOPHEN 500 MG PO TABS: 500.0000 mg | ORAL_TABLET | Freq: Four times a day (QID) | ORAL | Status: DC | PRN

## 2024-04-22 MED ORDER — METOPROLOL SUCCINATE ER 25 MG PO TB24
12.5000 mg | ORAL_TABLET | Freq: Every day | ORAL | Status: DC
  Filled 2024-04-22: qty 1

## 2024-04-22 MED ORDER — LEVOTHYROXINE SODIUM 137 MCG PO TABS
137.0000 ug | ORAL_TABLET | Freq: Every day | ORAL | Status: DC
  Administered 2024-04-23: 137 ug via ORAL
  Filled 2024-04-22: qty 1

## 2024-04-22 MED ORDER — PROCHLORPERAZINE EDISYLATE 10 MG/2ML IJ SOLN: 5.0000 mg | Freq: Four times a day (QID) | INTRAMUSCULAR | Status: DC | PRN

## 2024-04-22 MED ORDER — IOHEXOL 350 MG/ML SOLN
100.0000 mL | Freq: Once | INTRAVENOUS | Status: AC | PRN
  Administered 2024-04-22: 100 mL via INTRAVENOUS

## 2024-04-22 MED ORDER — TORSEMIDE 20 MG PO TABS
40.0000 mg | ORAL_TABLET | ORAL | Status: DC
  Filled 2024-04-22: qty 2

## 2024-04-22 MED ORDER — TORSEMIDE 20 MG PO TABS
20.0000 mg | ORAL_TABLET | ORAL | Status: DC
  Administered 2024-04-22: 20 mg via ORAL
  Filled 2024-04-22 (×2): qty 1

## 2024-04-22 NOTE — H&P (Signed)
 " History and Physical  Cindy [name] FMW:[id number] DOB: 29-Sep-1932 DOA: 04/22/2024  Referring physician: Dr. Suzette, EDP  PCP: Shona Norleen PEDLAR, MD  Outpatient Specialists: Cardiologist Patient coming from: Home  Chief Complaint: Chest pain   HPI: Cindy [name] is a 89 y.o. female with medical history significant for ILD, chronic hypoxia on 2 L nasal cannula, pulmonary HTN, cardiac AL amyloidosis, amyloid cardiomyopathy/restrictive cardiomyopathy, chronic HFpEF. Persistent AF s/p ablation on Eliquis , AV block s/p PPM (2018), prior PE, hypothyroidism, who presents to the ER due to left sided chest pain, sharp, 10 out of 10 in severity, intermittent, and lasting for about 45 minutes and recurring.  Not exacerbated by movement or deep inspiration, radiating to the left side of her back.  Symptoms started this afternoon.  Associated with shortness of breath.  Presented to the ER for further evaluation.  In the ER, vital signs are stable.  No evidence of acute ischemia on 12 lead EKG.  High sensitivity troponin negative x 2.  Due to high risk with chronic heart disease, EDP requested admission for chest pain rule out ACS.  Admitted by [facility], Hospital service.  ED Course: Temp 98.3.  BP 130/60, P67, RR12, O2 96% 3L [place].  Review of Systems: Review of systems as noted in the HPI. All other systems reviewed and are negative.   Past Medical History:  Diagnosis Date   Amyloidosis (HCC)    cancer   Cancer (HCC)    CHF (congestive heart failure) (HCC)    Amyloidosis   Diarrhea    Diverticulosis of colon (without mention of hemorrhage)    Edema    Esophagitis, unspecified    Family history of malignant neoplasm of gastrointestinal tract    Fibrosis of lung (HCC) 03/14/2013   Flatulence, eructation, and gas pain    GERD (gastroesophageal reflux disease)    Left sided ulcerative colitis (HCC)    Macular degeneration    left eye   Maxillary sinus mass    Osteoporosis, unspecified    Other  and unspecified hyperlipidemia    Other and unspecified noninfectious gastroenteritis and colitis(558.9)    Paroxysmal atrial fibrillation (HCC) 07/09/2019   Peripheral vascular disease    Pneumonia    Stricture and stenosis of esophagus    Unspecified hypothyroidism    URI (upper respiratory infection)    Past Surgical History:  Procedure Laterality Date   ABDOMINAL HYSTERECTOMY     CATARACT EXTRACTION W/PHACO Bilateral    Dr. Roz   CHOLECYSTECTOMY     THYROIDECTOMY, PARTIAL     YAG LASER APPLICATION Right 12/12/2013   Procedure: YAG LASER APPLICATION;  Surgeon: Oneil T. Roz, MD;  Location: [facility];  Service: Ophthalmology;  Laterality: Right;    Social History:  reports that she has never smoked. She has never been exposed to tobacco smoke. She has never used smokeless tobacco. She reports that she does not drink alcohol  and does not use drugs.   Allergies[1]  Family History  Problem Relation Age of Onset   Colon cancer Mother    Seizures Mother    Heart attack Father        CHF   Stomach cancer Sister    Heart disease Brother    Diabetes Son    Adrenal disorder Son    Kidney cancer Son       Prior to Admission medications  Medication Sig Start Date End Date Taking? Authorizing Provider  acetaminophen  (TYLENOL ) 650 MG CR tablet Take 650 mg  by mouth every 8 (eight) hours as needed for pain or fever.    [provider]  albuterol  (PROVENTIL ) (2.5 MG/3ML) 0.083% nebulizer solution Take 3 mLs (2.5 mg total) by nebulization every 6 (six) hours as needed for wheezing or shortness of breath. 02/17/24 02/16/25  Alghanim, Paula, MD  albuterol  (PROVENTIL ) (2.5 MG/3ML) 0.083% nebulizer solution Take 3 mLs (2.5 mg total) by nebulization every 6 (six) hours as needed for wheezing or shortness of breath. 2-3 times a week 02/17/24   Alghanim, Paula, MD  ALPRAZolam  (XANAX ) 0.5 MG tablet Take 1 tablet (0.5 mg total) by mouth at bedtime as needed for anxiety. 03/15/13    Levern Beckey HERO, MD  amoxicillin (AMOXIL) 875 MG tablet Take 875 mg by mouth 2 (two) times daily. Patient not taking: Reported on 03/21/2024    [provider]  apixaban  (ELIQUIS ) 2.5 MG TABS tablet Take 1 tablet (2.5 mg total) by mouth 2 (two) times daily. 09/21/23   Mallipeddi, Vishnu P, MD  BIOTIN PO Take 1 tablet by mouth daily. Patient not taking: Reported on 03/21/2024    [provider]  Cholecalciferol  50 MCG (2000 UT) CAPS Take 1 capsule by mouth daily.    [provider]  clobetasol (TEMOVATE) 0.05 % external solution Apply 1 Application topically 2 (two) times daily. 09/17/23   [provider]  cyanocobalamin (VITAMIN B12) 1000 MCG tablet Take 1,000 mcg by mouth daily.    [provider]  FARXIGA  10 MG TABS tablet TAKE 1 TABLET BY MOUTH DAILY 02/08/24   Mallipeddi, Vishnu P, MD  fentaNYL  (DURAGESIC ) 25 MCG/HR Place 1 patch onto the skin every 3 (three) days. 06/15/19   [provider]  Fluocinolone Acetonide 0.01 % OIL Place 5 drops into both ears as needed (itching). As needed for itching 08/14/21   [provider]  levothyroxine  (SYNTHROID ) 137 MCG tablet Take 137 mcg by mouth daily. 02/16/22   [provider]  metoprolol  succinate (TOPROL -XL) 25 MG 24 hr tablet Take 0.5-1 tablets (12.5-25 mg total) by mouth in the morning and at bedtime. Patient takes 1 tablet (25mg ) in the morning and 1/2 of a tablet (12.5mg ) in the evening Patient taking differently: Take 25 mg by mouth daily. Takes 1/2 07/10/19   Ricky Fines, MD  Multiple Vitamins-Minerals (PRESERVISION AREDS PO) Take 1 capsule by mouth daily.    [provider]  [facility] powder Apply 1 Application topically 2 (two) times daily.    [provider]  ondansetron  (ZOFRAN ) 4 MG tablet Take 4 mg by mouth every 8 (eight) hours as needed for nausea or vomiting. 08/03/19   [provider]  Polyethyl Glycol-Propyl Glycol (SYSTANE OP) Apply 1 drop to  eye daily.    [provider]  potassium chloride  (KLOR-CON  M) 10 MEQ tablet Take 10 mEq by mouth daily. 02/05/22   [provider]  Probiotic Product (ALIGN) 4 MG CAPS Take 1 tablet by mouth every morning. Patient not taking: Reported on 03/21/2024    [provider]  senna (SENOKOT) 8.6 MG tablet Take 2 tablets by mouth as needed for constipation.    [provider]  sodium chloride  (OCEAN) 0.65 % nasal spray Place 2 sprays into the nose daily as needed for congestion.    [provider]  torsemide  (DEMADEX ) 20 MG tablet Take 20 mg (1 tablet) daily alternating with 40 mg (2 tablets) 03/07/24   Mallipeddi, Vishnu P, MD    Physical Exam: BP 130/60   Pulse 63  Temp 98.3 F (36.8 C) (Temporal)   Resp 15   Ht 5' 7 (1.702 m)   Wt 55.3 kg   SpO2 100%   BMI 19.11 kg/m   General: 89 y.o. year-old female weak appearing in no acute distress.  Alert and oriented x3. Cardiovascular: Regular rate and rhythm with no rubs or gallops.  No thyromegaly.  Positive bilateral JVD noted.  Lower extremity edema bilaterally. Respiratory: Mild rales diffusely. Good inspiratory effort. Abdomen: Soft nontender nondistended with normal bowel sounds x4 quadrants. Muskuloskeletal: No cyanosis or clubbing noted bilaterally Neuro: CN II-XII intact, strength, sensation, reflexes Skin: No ulcerative lesions noted or rashes Psychiatry: Judgement and insight appear normal. Mood is appropriate for condition and setting          Labs on Admission:  Basic Metabolic Panel: Recent Labs  Lab 04/22/24 1753  NA 139  K 4.1  CL 97*  CO2 23  GLUCOSE 86  BUN 18  CREATININE 0.85  CALCIUM  9.3   Liver Function Tests: Recent Labs  Lab 04/22/24 1753  AST 23  ALT 6  ALKPHOS 141*  BILITOT 1.4*  PROT 7.3  ALBUMIN 4.0   No results for input(s): LIPASE, AMYLASE in the last 168 hours. No results for input(s): AMMONIA in the last 168 hours. CBC: Recent Labs  Lab  04/22/24 1753  WBC 6.0  HGB 13.8  HCT 43.6  MCV 101.4*  PLT 173   Cardiac Enzymes: No results for input(s): CKTOTAL, CKMB, CKMBINDEX, TROPONINI in the last 168 hours.  BNP (last 3 results) Recent Labs    09/02/23 1438  BNP 231.0*    ProBNP (last 3 results) Recent Labs    04/22/24 1753  PROBNP 597.0*    CBG: No results for input(s): GLUCAP in the last 168 hours.  Radiological Exams on Admission: CT Angio Chest/Abd/Pel for Dissection W and/or Wo Contrast Result Date: 04/22/2024 EXAM: CTA CHEST, ABDOMEN AND PELVIS WITHOUT AND WITH CONTRAST 04/22/2024 06:48:37 PM TECHNIQUE: CTA of the chest was performed without and with the administration of 100 mL of intravenous iohexol  (OMNIPAQUE ) 350 MG/ML injection. CTA of the abdomen and pelvis was performed without and with the administration of 100 mL of intravenous iohexol  (OMNIPAQUE ) 350 MG/ML injection. Multiplanar reformatted images are provided for review. MIP images are provided for review. Automated exposure control, iterative reconstruction, and/or weight based adjustment of the mA/kV was utilized to reduce the radiation dose to as low as reasonably achievable. COMPARISON: 10/08/2022 CLINICAL HISTORY: Acute aortic syndrome (AAS) suspected. FINDINGS: VASCULATURE: AORTA: Coronary artery and aortic atherosclerosis. No aneurysm or dissection. PULMONARY ARTERIES: No pulmonary embolism with the limits of this exam. GREAT VESSELS OF AORTIC ARCH: Coronary artery and aortic atherosclerosis. No dissection. No arterial occlusion or significant stenosis. CELIAC TRUNK: No acute finding. No occlusion or significant stenosis. SUPERIOR MESENTERIC ARTERY: Narrowing at the origin and within the proximal SMA, likely 50% stenosis. INFERIOR MESENTERIC ARTERY: No acute finding. No occlusion or significant stenosis. RENAL ARTERIES: No acute finding. No occlusion or significant stenosis. ILIAC ARTERIES: No acute finding. No occlusion or significant  stenosis. CHEST: MEDIASTINUM: Pacer wires in the right heart. Coronary artery and aortic atherosclerosis. No mediastinal lymphadenopathy. The heart and pericardium demonstrate no acute abnormality. LUNGS AND PLEURA: Mosaic attenuation throughout the lungs is compatible with air trapping. Bilateral upper lobes scarring, band-like scarring in the lower lobes bilaterally. Trace right pleural effusion. No pneumothorax. THORACIC BONES AND SOFT TISSUES: No acute bone or soft tissue abnormality. ABDOMEN AND PELVIS: LIVER: Scattered hypodensities in  the liver, the largest 2.2 cm most compatible with cysts. GALLBLADDER AND BILE DUCTS: Gallbladder is unremarkable. No biliary ductal dilatation. SPLEEN: The spleen is unremarkable. PANCREAS: The pancreas is unremarkable. ADRENAL GLANDS: Bilateral adrenal glands demonstrate no acute abnormality. KIDNEYS, URETERS AND BLADDER: Scattered hypodensities in the left kidney, the largest measuring 2.2 cm most compatible with simple cysts. No stones in the kidneys or ureters. No hydronephrosis. No perinephric or periureteral stranding. Urinary bladder is unremarkable. GI AND BOWEL: Diffuse colonic diverticulosis. No active diverticulitis. Stomach and duodenal sweep demonstrate no acute abnormality. There is no bowel obstruction. No abnormal bowel wall thickening or distension. REPRODUCTIVE: Prior hysterectomy. PERITONEUM AND RETROPERITONEUM: No ascites or free air. LYMPH NODES: No lymphadenopathy. ABDOMINAL BONES AND SOFT TISSUES: No acute abnormality of the bones. No acute soft tissue abnormality. IMPRESSION: 1. No evidence of acute aortic syndrome. 2. No pulmonary embolus. 3. Narrowing at the origin and within the proximal SMA, likely 50% stenosis. 4. Coronary artery and aortic atherosclerosis. 5. Diffuse colonic diverticulosis. No active diverticulitis. Electronically signed by: Franky Crease MD 04/22/2024 07:35 PM EST RP Workstation: [id number]   DG Chest Portable 1 View Result Date:  04/22/2024 EXAM: 1 VIEW(S) XRAY OF THE CHEST 04/22/2024 06:10:00 PM COMPARISON: Comparison with 09/02/2023. CLINICAL HISTORY: Shortness of breath and chest pain starting this afternoon. FINDINGS: LINES, TUBES AND DEVICES: Cardiac pacemaker. LUNGS AND PLEURA: Interstitial changes in the lung bases may be due to chronic fibrosis or edema. Similar appearance to prior study. Emphysematous changes and scattered fibrosis in the lungs. Small bilateral pleural effusions. No pneumothorax. HEART AND MEDIASTINUM: Mild cardiac enlargement with mild vascular congestion. Calcification of the aorta. BONES AND SOFT TISSUES: Degenerative changes in the spine. No acute osseous abnormality. IMPRESSION: 1. Mild cardiac enlargement with mild vascular congestion, similar to prior study. 2. Interstitial changes in the lung bases, which may be due to chronic fibrosis or edema, similar to prior study. 3. Small bilateral pleural effusions. No pneumothorax. Electronically signed by: Elsie Gravely MD 04/22/2024 06:23 PM EST RP Workstation: [id number]    EKG: I independently viewed the EKG done and my findings are as followed: SR 68, QTC 455.   Assessment/Plan Present on Admission:  Chest pain  Active Problems:   Chest pain  Atypical chest pain No evidence of acute ischemia on 12 lead EKG High sensitivity troponin negative x 2 Follow transthoracic echo Monitor on telemetry Consider cardiology consult in the AM  ILD, chronic hypoxia, on 2L [place] continuously at baseline Resume home regimen Maintain O2 saturation above 90%  Chronic HFpEF Last 2D echo done on 09/18/22 revealed LVEF 60-65% Follow repeat 2D echo Monitor strict I's and Os and daily weight   Persistent AF s/p ablation on Eliquis  Resume home Eliquis  and home Beta Blocker Monitor on telemetry  AV block s/p PPM (2018) No acute issues  Hypothyroidism Resume home levothyroxine   Generalized weakness PT/OT evaluation Fall precautions At baseline,  ambulates with a walker.   Time:  75 minutes   DVT prophylaxis: Home Eliquis  BID   Code Status: Full code   Family Communication: None at bedside   Disposition Plan: Admitted to telemetry unit   Consults called: None   Admission status: Observation    Status is: Observation    Terry LOISE Hurst MD [facility] Pager [phone]  If 7PM-7AM, please contact night-coverage [email or web address] Password [email or web address]  04/22/2024, 8:05 PM      [1]  Allergies Allergen Reactions   Nebivolol Hcl Shortness Of Breath, Other (See Comments) and Cough  Chest tightness, severe tiredness, confusion   Codeine Nausea Only   Sulfa Antibiotics Nausea Only   Sulfonamide Derivatives Nausea Only   "

## 2024-04-22 NOTE — ED Triage Notes (Signed)
 Pt with left sided CP that started this afternoon. Also c/o SOB and with hx of CHF. Denies any weight gain.

## 2024-04-22 NOTE — ED Notes (Signed)
 Pt's fingers are cold and unable to obtain pulse ox

## 2024-04-23 ENCOUNTER — Observation Stay (HOSPITAL_COMMUNITY)

## 2024-04-23 ENCOUNTER — Other Ambulatory Visit: Payer: Self-pay

## 2024-04-23 DIAGNOSIS — R079 Chest pain, unspecified: Secondary | ICD-10-CM | POA: Diagnosis not present

## 2024-04-23 DIAGNOSIS — R0789 Other chest pain: Secondary | ICD-10-CM | POA: Diagnosis not present

## 2024-04-23 LAB — CBC
HCT: 38.5 % (ref 36.0–46.0)
Hemoglobin: 12.4 g/dL (ref 12.0–15.0)
MCH: 32.2 pg (ref 26.0–34.0)
MCHC: 32.2 g/dL (ref 30.0–36.0)
MCV: 100 fL (ref 80.0–100.0)
Platelets: 147 K/uL — ABNORMAL LOW (ref 150–400)
RBC: 3.85 MIL/uL — ABNORMAL LOW (ref 3.87–5.11)
RDW: 15.3 % (ref 11.5–15.5)
WBC: 4.5 K/uL (ref 4.0–10.5)
nRBC: 0 % (ref 0.0–0.2)

## 2024-04-23 LAB — BASIC METABOLIC PANEL WITH GFR
Anion gap: 8 (ref 5–15)
BUN: 18 mg/dL (ref 8–23)
CO2: 33 mmol/L — ABNORMAL HIGH (ref 22–32)
Calcium: 8.7 mg/dL — ABNORMAL LOW (ref 8.9–10.3)
Chloride: 98 mmol/L (ref 98–111)
Creatinine, Ser: 0.76 mg/dL (ref 0.44–1.00)
GFR, Estimated: >60 mL/min
Glucose, Bld: 72 mg/dL (ref 70–99)
Potassium: 3.6 mmol/L (ref 3.5–5.1)
Sodium: 139 mmol/L (ref 135–145)

## 2024-04-23 LAB — ECHOCARDIOGRAM COMPLETE
AR max vel: 1.82 cm2
AV Area VTI: 2.03 cm2
AV Area mean vel: 1.96 cm2
AV Mean grad: 3 mmHg
AV Peak grad: 5.3 mmHg
Ao pk vel: 1.15 m/s
Area-P 1/2: 3.34 cm2
Height: 67 in
S' Lateral: 2.4 cm
Weight: 1910.4 [oz_av]

## 2024-04-23 LAB — MAGNESIUM: Magnesium: 2.3 mg/dL (ref 1.7–2.4)

## 2024-04-23 LAB — PHOSPHORUS: Phosphorus: 4.5 mg/dL (ref 2.5–4.6)

## 2024-04-23 NOTE — Discharge Summary (Signed)
 " Physician Discharge Summary   Patient: Cindy [name] MRN: [id number] DOB: [birth year]  Admit date:     04/22/2024  Discharge date: 04/23/2024  Discharge Physician: Eric Nunnery   PCP: Shona Norleen PEDLAR, MD   Recommendations at discharge:  Reassess blood pressure and adjust medication as needed Make sure patient follow-up with cardiology service as instructed   Discharge Diagnoses: Active Problems:   Atypical chest pain  Brief Hospital admission narrative: As per H&P written by Dr. Shona on 04/22/2024 Cindy [name] is a 89 y.o. female with medical history significant for ILD, chronic hypoxia on 2 L nasal cannula, pulmonary HTN, cardiac AL amyloidosis, amyloid cardiomyopathy/restrictive cardiomyopathy, chronic HFpEF. Persistent AF s/p ablation on Eliquis , AV block s/p PPM (2018), prior PE, hypothyroidism, who presents to the ER due to left sided chest pain, sharp, 10 out of 10 in severity, intermittent, and lasting for about 45 minutes and recurring.  Not exacerbated by movement or deep inspiration, radiating to the left side of her back.  Symptoms started this afternoon.  Associated with shortness of breath.  Presented to the ER for further evaluation.   In the ER, vital signs are stable.  No evidence of acute ischemia on 12 lead EKG.  High sensitivity troponin negative x 2.  Due to high risk with chronic heart disease, EDP requested admission for chest pain rule out ACS.  Admitted by [facility], Hospital service.  Assessment and Plan: 1-atypical chest pain - No acute ischemic changes appreciated on EKG or telemetry - High sensitive troponin negative x 2 - Patient chest pain completely relieved and resolved prior to discharge - 2D echo demonstrating no wall motion abnormalities and preserved ejection fraction. - Patient advised to follow heart healthy/low-sodium diet, to resume home medication regimen and to follow-up with cardiology as an outpatient.  2-interstitial lung disease/chronic  hypoxic respiratory failure (on 2 L nasal cannula supplementation) - Appears to be stable and at baseline - Continue oxygen  supplementation - Continue home bronchodilator management - Continue to follow-up with PCP/pulmonologist as an outpatient.  3-chronic diastolic heart failure - Repeat echo demonstrating preserved ejection fraction, no wall motion abnormalities and at this time unable to assess diastolic function. - Continue to follow daily weights and adequate hydration - Low-sodium diet discussed with patient - Resume home medications/diuretic regimen - Continue patient follow-up with cardiology service.  4-hypothyroidism - Continue Synthroid .  5-generalized weakness - Seen by physical therapy and found to be safe to return home without home health services or any further recommendations.  6-persistent atrial fibrillation status post ablation and pacemaker implantation - Continue patient follow-up with cardiology service - Continue beta-blocker at current dose for rate control - Continue Eliquis  for secondary prevention.  7-history of pulmonary embolism - Continue prevention with chronic anticoagulation.  Consultants: None Procedures performed: See below for x-ray report; 2D echo. Disposition: Home Diet recommendation: Heart healthy/low-sodium diet.  DISCHARGE MEDICATION: Allergies as of 04/23/2024       Reactions   Nebivolol Hcl Shortness Of Breath, Other (See Comments), Cough   Chest tightness, severe tiredness, confusion   Codeine Nausea Only   Sulfa Antibiotics Nausea Only   Sulfonamide Derivatives Nausea Only        Medication List     STOP taking these medications    amoxicillin 875 MG tablet Commonly known as: AMOXIL   BIOTIN PO       TAKE these medications    acetaminophen  650 MG CR tablet Commonly known as: TYLENOL  Take 650 mg by  mouth every 8 (eight) hours as needed for pain or fever.   albuterol  (2.5 MG/3ML) 0.083% nebulizer  solution Commonly known as: PROVENTIL  Take 3 mLs (2.5 mg total) by nebulization every 6 (six) hours as needed for wheezing or shortness of breath.   albuterol  (2.5 MG/3ML) 0.083% nebulizer solution Commonly known as: PROVENTIL  Take 3 mLs (2.5 mg total) by nebulization every 6 (six) hours as needed for wheezing or shortness of breath. 2-3 times a week   Align 4 MG Caps Take 1 tablet by mouth every morning.   ALPRAZolam  0.5 MG tablet Commonly known as: Xanax  Take 1 tablet (0.5 mg total) by mouth at bedtime as needed for anxiety.   apixaban  2.5 MG Tabs tablet Commonly known as: ELIQUIS  Take 1 tablet (2.5 mg total) by mouth 2 (two) times daily.   Cholecalciferol  50 MCG (2000 UT) Caps Take 1 capsule by mouth daily.   clobetasol 0.05 % external solution Commonly known as: TEMOVATE Apply 1 Application topically 2 (two) times daily.   cyanocobalamin 1000 MCG tablet Commonly known as: VITAMIN B12 Take 1,000 mcg by mouth daily.   Farxiga  10 MG Tabs tablet Generic drug: dapagliflozin  propanediol TAKE 1 TABLET BY MOUTH DAILY   fentaNYL  25 MCG/HR Commonly known as: DURAGESIC  Place 1 patch onto the skin every 3 (three) days.   Fluocinolone Acetonide 0.01 % Oil Place 5 drops into both ears as needed (itching). As needed for itching   levothyroxine  137 MCG tablet Commonly known as: SYNTHROID  Take 137 mcg by mouth daily.   metoprolol  succinate 25 MG 24 hr tablet Commonly known as: TOPROL -XL Take 0.5-1 tablets (12.5-25 mg total) by mouth in the morning and at bedtime. Patient takes 1 tablet (25mg ) in the morning and 1/2 of a tablet (12.5mg ) in the evening What changed:  how much to take when to take this additional instructions   Nyamyc powder Generic drug: nystatin Apply 1 Application topically 2 (two) times daily.   ondansetron  4 MG tablet Commonly known as: ZOFRAN  Take 4 mg by mouth every 8 (eight) hours as needed for nausea or vomiting.   potassium chloride  10 MEQ  tablet Commonly known as: KLOR-CON  M Take 10 mEq by mouth daily.   PRESERVISION AREDS PO Take 1 capsule by mouth daily.   senna 8.6 MG tablet Commonly known as: SENOKOT Take 2 tablets by mouth as needed for constipation.   sodium chloride  0.65 % nasal spray Commonly known as: OCEAN Place 2 sprays into the nose daily as needed for congestion.   SYSTANE OP Apply 1 drop to eye daily.   torsemide  20 MG tablet Commonly known as: DEMADEX  Take 20 mg (1 tablet) daily alternating with 40 mg (2 tablets)        Follow-up Information     Shona Norleen PEDLAR, MD. Schedule an appointment as soon as possible for a visit in 10 day(s).   Specialty: Internal Medicine Contact information: [address] [place] [address] [phone]                Discharge Exam: Filed Weights   04/22/24 1724 04/22/24 2116 04/23/24 0542  Weight: 55.3 kg 55.7 kg 54.2 kg   General exam: Alert, awake, oriented x 3; feeling back to her baseline and ready to go home.  Patient reports no chest pain. Respiratory system: Good saturation on chronic supplementation; no using accessory muscles. Cardiovascular system: Rate controlled, no rubs, no gallops, no JVD. Gastrointestinal system: Abdomen is nondistended, soft and nontender. No organomegaly  or masses felt. Normal bowel sounds heard. Central nervous system: No focal neurological deficits. Extremities: No cyanosis or clubbing.  Trace to 1+ edema appreciated bilaterally (patient reports unchanged from baseline). Skin: No petechiae. Psychiatry: Judgement and insight appear normal. Mood & affect appropriate.    Condition at discharge: Stable and improved.  The results of significant diagnostics from this hospitalization (including imaging, microbiology, ancillary and laboratory) are listed below for reference.   Imaging Studies: ECHOCARDIOGRAM COMPLETE Result Date: 04/23/2024    ECHOCARDIOGRAM REPORT   Patient Name:   ARMA REINING Date of  Exam: 04/23/2024 Medical Rec #:  [id number]          Height:       67.0 in Accession #:    [id number]         Weight:       119.4 lb Date of Birth:  [birth year]          BSA:          1.624 m Patient Age:    89 years           BP:           97/59 mmHg Patient Gender: F                  HR:           64 bpm. Exam Location:  [facility] Procedure: 2D Echo, Cardiac Doppler and Color Doppler (Both Spectral and Color            Flow Doppler were utilized during procedure). Indications:    Chest Pain R07.9  History:        Patient has prior history of Echocardiogram examinations, most                 recent 09/18/2022. CHF; Arrythmias:Atrial Fibrillation.  Sonographer:    Jayson Gaskins Referring Phys: [id number] CAROLE N HALL IMPRESSIONS  1. Left ventricular ejection fraction, by estimation, is 60 to 65%. The left ventricle has normal function. The left ventricle has no regional wall motion abnormalities. Left ventricular diastolic function could not be evaluated.  2. Right ventricular systolic function is normal. The right ventricular size is normal. There is moderately elevated pulmonary artery systolic pressure. The estimated right ventricular systolic pressure is 56.4 mmHg.  3. Right atrial size was severely dilated.  4. The mitral valve is rheumatic. Mild to moderate mitral valve regurgitation. No evidence of mitral stenosis.  5. Tricuspid valve regurgitation is mild to moderate.  6. The aortic valve is tricuspid. Aortic valve regurgitation is not visualized. Aortic valve sclerosis/calcification is present, without any evidence of aortic stenosis. Aortic valve area, by VTI measures 2.03 cm. Aortic valve mean gradient measures 3.0 mmHg. Aortic valve Vmax measures 1.15 m/s.  7. The inferior vena cava is normal in size with <50% respiratory variability, suggesting right atrial pressure of 8 mmHg. FINDINGS  Left Ventricle: Left ventricular ejection fraction, by estimation, is 60 to 65%. The left ventricle has normal function. The  left ventricle has no regional wall motion abnormalities. The left ventricular internal cavity size was normal in size. There is  no left ventricular hypertrophy. Left ventricular diastolic function could not be evaluated. Right Ventricle: The right ventricular size is normal. No increase in right ventricular wall thickness. Right ventricular systolic function is normal. There is moderately elevated pulmonary artery systolic pressure. The tricuspid regurgitant velocity is 3.48 m/s, and with an assumed right atrial pressure of 8 mmHg, the estimated right  ventricular systolic pressure is 56.4 mmHg. Left Atrium: Left atrial size was normal in size. Right Atrium: Right atrial size was severely dilated. Pericardium: There is no evidence of pericardial effusion. Mitral Valve: The mitral valve is rheumatic. There is moderate thickening of the mitral valve leaflet(s). There is mild calcification of the mitral valve leaflet(s). Mild to moderate mitral valve regurgitation. No evidence of mitral valve stenosis. Tricuspid Valve: The tricuspid valve is normal in structure. Tricuspid valve regurgitation is mild to moderate. No evidence of tricuspid stenosis. Aortic Valve: The aortic valve is tricuspid. Aortic valve regurgitation is not visualized. Aortic valve sclerosis/calcification is present, without any evidence of aortic stenosis. Aortic valve mean gradient measures 3.0 mmHg. Aortic valve peak gradient measures 5.3 mmHg. Aortic valve area, by VTI measures 2.03 cm. Pulmonic Valve: The pulmonic valve was normal in structure. Pulmonic valve regurgitation is not visualized. No evidence of pulmonic stenosis. Aorta: The aortic root is normal in size and structure. Venous: The inferior vena cava is normal in size with less than 50% respiratory variability, suggesting right atrial pressure of 8 mmHg. IAS/Shunts: No atrial level shunt detected by color flow Doppler. Additional Comments: A device lead is visualized.  LEFT VENTRICLE  PLAX 2D LVIDd:         3.60 cm LVIDs:         2.40 cm LV PW:         0.90 cm LV IVS:        0.90 cm LVOT diam:     1.90 cm LV SV:         46 LV SV Index:   29 LVOT Area:     2.84 cm  LEFT ATRIUM           Index LA Vol (A4C): 25.7 ml 15.82 ml/m  AORTIC VALVE AV Area (Vmax):    1.82 cm AV Area (Vmean):   1.96 cm AV Area (VTI):     2.03 cm AV Vmax:           115.00 cm/s AV Vmean:          83.600 cm/s AV VTI:            0.229 m AV Peak Grad:      5.3 mmHg AV Mean Grad:      3.0 mmHg LVOT Vmax:         73.90 cm/s LVOT Vmean:        57.800 cm/s LVOT VTI:          0.164 m LVOT/AV VTI ratio: 0.72  AORTA Ao Root diam: 3.10 cm MITRAL VALVE                TRICUSPID VALVE MV Area (PHT): 3.34 cm     TR Peak grad:   48.4 mmHg MV Decel Time: 227 msec     TR Vmax:        348.00 cm/s MV E velocity: 149.00 cm/s                             SHUNTS                             Systemic VTI:  0.16 m                             Systemic Diam: 1.90 cm Traci  Turner MD Electronically signed by Wilbert Bihari MD Signature Date/Time: 04/23/2024/11:26:46 AM    Final    CT Angio Chest/Abd/Pel for Dissection W and/or Wo Contrast Result Date: 04/22/2024 EXAM: CTA CHEST, ABDOMEN AND PELVIS WITHOUT AND WITH CONTRAST 04/22/2024 06:48:37 PM TECHNIQUE: CTA of the chest was performed without and with the administration of 100 mL of intravenous iohexol  (OMNIPAQUE ) 350 MG/ML injection. CTA of the abdomen and pelvis was performed without and with the administration of 100 mL of intravenous iohexol  (OMNIPAQUE ) 350 MG/ML injection. Multiplanar reformatted images are provided for review. MIP images are provided for review. Automated exposure control, iterative reconstruction, and/or weight based adjustment of the mA/kV was utilized to reduce the radiation dose to as low as reasonably achievable. COMPARISON: 10/08/2022 CLINICAL HISTORY: Acute aortic syndrome (AAS) suspected. FINDINGS: VASCULATURE: AORTA: Coronary artery and aortic atherosclerosis. No  aneurysm or dissection. PULMONARY ARTERIES: No pulmonary embolism with the limits of this exam. GREAT VESSELS OF AORTIC ARCH: Coronary artery and aortic atherosclerosis. No dissection. No arterial occlusion or significant stenosis. CELIAC TRUNK: No acute finding. No occlusion or significant stenosis. SUPERIOR MESENTERIC ARTERY: Narrowing at the origin and within the proximal SMA, likely 50% stenosis. INFERIOR MESENTERIC ARTERY: No acute finding. No occlusion or significant stenosis. RENAL ARTERIES: No acute finding. No occlusion or significant stenosis. ILIAC ARTERIES: No acute finding. No occlusion or significant stenosis. CHEST: MEDIASTINUM: Pacer wires in the right heart. Coronary artery and aortic atherosclerosis. No mediastinal lymphadenopathy. The heart and pericardium demonstrate no acute abnormality. LUNGS AND PLEURA: Mosaic attenuation throughout the lungs is compatible with air trapping. Bilateral upper lobes scarring, band-like scarring in the lower lobes bilaterally. Trace right pleural effusion. No pneumothorax. THORACIC BONES AND SOFT TISSUES: No acute bone or soft tissue abnormality. ABDOMEN AND PELVIS: LIVER: Scattered hypodensities in the liver, the largest 2.2 cm most compatible with cysts. GALLBLADDER AND BILE DUCTS: Gallbladder is unremarkable. No biliary ductal dilatation. SPLEEN: The spleen is unremarkable. PANCREAS: The pancreas is unremarkable. ADRENAL GLANDS: Bilateral adrenal glands demonstrate no acute abnormality. KIDNEYS, URETERS AND BLADDER: Scattered hypodensities in the left kidney, the largest measuring 2.2 cm most compatible with simple cysts. No stones in the kidneys or ureters. No hydronephrosis. No perinephric or periureteral stranding. Urinary bladder is unremarkable. GI AND BOWEL: Diffuse colonic diverticulosis. No active diverticulitis. Stomach and duodenal sweep demonstrate no acute abnormality. There is no bowel obstruction. No abnormal bowel wall thickening or distension.  REPRODUCTIVE: Prior hysterectomy. PERITONEUM AND RETROPERITONEUM: No ascites or free air. LYMPH NODES: No lymphadenopathy. ABDOMINAL BONES AND SOFT TISSUES: No acute abnormality of the bones. No acute soft tissue abnormality. IMPRESSION: 1. No evidence of acute aortic syndrome. 2. No pulmonary embolus. 3. Narrowing at the origin and within the proximal SMA, likely 50% stenosis. 4. Coronary artery and aortic atherosclerosis. 5. Diffuse colonic diverticulosis. No active diverticulitis. Electronically signed by: Franky Crease MD 04/22/2024 07:35 PM EST RP Workstation: [id number]   DG Chest Portable 1 View Result Date: 04/22/2024 EXAM: 1 VIEW(S) XRAY OF THE CHEST 04/22/2024 06:10:00 PM COMPARISON: Comparison with 09/02/2023. CLINICAL HISTORY: Shortness of breath and chest pain starting this afternoon. FINDINGS: LINES, TUBES AND DEVICES: Cardiac pacemaker. LUNGS AND PLEURA: Interstitial changes in the lung bases may be due to chronic fibrosis or edema. Similar appearance to prior study. Emphysematous changes and scattered fibrosis in the lungs. Small bilateral pleural effusions. No pneumothorax. HEART AND MEDIASTINUM: Mild cardiac enlargement with mild vascular congestion. Calcification of the aorta. BONES AND SOFT TISSUES: Degenerative changes in the spine. No acute  osseous abnormality. IMPRESSION: 1. Mild cardiac enlargement with mild vascular congestion, similar to prior study. 2. Interstitial changes in the lung bases, which may be due to chronic fibrosis or edema, similar to prior study. 3. Small bilateral pleural effusions. No pneumothorax. Electronically signed by: Elsie Gravely MD 04/22/2024 06:23 PM EST RP Workstation: [id number]    Microbiology: Results for orders placed or performed in visit on 04/29/23  Microscopic Examination     Status: Abnormal   Collection Time: 04/29/23 11:24 AM   Urine  Result Value Ref Range Status   WBC, UA 6-10 (A) 0 - 5 /hpf Final   RBC, Urine 0-2 0 - 2 /hpf Final    Epithelial Cells (non renal) 0-10 0 - 10 /hpf Final   Bacteria, UA Many (A) None seen/Few Final    Labs: CBC: Recent Labs  Lab 04/22/24 1753 04/23/24 0456  WBC 6.0 4.5  HGB 13.8 12.4  HCT 43.6 38.5  MCV 101.4* 100.0  PLT 173 147*   Basic Metabolic Panel: Recent Labs  Lab 04/22/24 1753 04/23/24 0456  NA 139 139  K 4.1 3.6  CL 97* 98  CO2 23 33*  GLUCOSE 86 72  BUN 18 18  CREATININE 0.85 0.76  CALCIUM  9.3 8.7*  MG  --  2.3  PHOS  --  4.5   Liver Function Tests: Recent Labs  Lab 04/22/24 1753  AST 23  ALT 6  ALKPHOS 141*  BILITOT 1.4*  PROT 7.3  ALBUMIN 4.0   CBG: No results for input(s): GLUCAP in the last 168 hours.  Discharge time spent:  35 minutes.  Signed: Eric Nunnery, MD [facility] 04/23/2024 "

## 2024-04-23 NOTE — Plan of Care (Signed)

## 2024-04-23 NOTE — Plan of Care (Signed)

## 2024-04-23 NOTE — Evaluation (Signed)
 Physical Therapy Evaluation Patient Details Name: Flossie Wexler MRN: [id number] DOB: [birth year] Today's Date: 04/23/2024  History of Present Illness  Shakeyla Giebler is a 89 y.o. female with medical history significant for ILD, chronic hypoxia on 2 L nasal cannula, pulmonary HTN, cardiac AL amyloidosis, amyloid cardiomyopathy/restrictive cardiomyopathy, chronic HFpEF. Persistent AF s/p ablation on Eliquis , AV block s/p PPM (2018), prior PE, hypothyroidism, who presents to the ER due to left sided chest pain, sharp, 10 out of 10 in severity, intermittent, and lasting for about 45 minutes and recurring.  Not exacerbated by movement or deep inspiration, radiating to the left side of her back.  Symptoms started this afternoon.  Associated with shortness of breath.  Presented to the ER for further evaluation.   Clinical Impression  Patient functioning near baseline for functional mobility and gait demonstrating good return for ambulating in room using her cane without loss of balance, on 2 LPM O2 with SpO2 at 94%. Patient tolerated sitting up in chair after therapy with her spouse present. Patient encouraged to ambulate with nursing staff/mobility tech as tolerated for length of stay. Plan:  Patient discharged from physical therapy to care of nursing for ambulation daily as tolerated for length of stay.           If plan is discharge home, recommend the following: A little help with walking and/or transfers;A little help with bathing/dressing/bathroom;Help with stairs or ramp for entrance;Assist for transportation;Assistance with cooking/housework   Can travel by private vehicle        Equipment Recommendations None recommended by PT  Recommendations for Other Services       Functional Status Assessment Patient has had a recent decline in their functional status and demonstrates the ability to make significant improvements in function in a reasonable and predictable amount of time.      Precautions / Restrictions Precautions Precautions: Fall Restrictions Weight Bearing Restrictions Per Provider Order: No      Mobility  Bed Mobility Overal bed mobility: Modified Independent                  Transfers Overall transfer level: Modified independent                      Ambulation/Gait Ambulation/Gait assistance: Modified independent (Device/Increase time) Gait Distance (Feet): 80 Feet Assistive device: Straight cane Gait Pattern/deviations: WFL(Within Functional Limits), Step-to pattern, Decreased stride length Gait velocity: decreased     General Gait Details: grossly WFL using her SPC with mostly step-to pattern without loss of balance, on 2 LPM with SpO2 at 94%  Stairs            Wheelchair Mobility     Tilt Bed    Modified Rankin (Stroke Patients Only)       Balance Overall balance assessment: Needs assistance Sitting-balance support: Feet supported, No upper extremity supported Sitting balance-Leahy Scale: Good Sitting balance - Comments: seated at EOB   Standing balance support: Reliant on assistive device for balance, During functional activity, Single extremity supported Standing balance-Leahy Scale: Fair Standing balance comment: fair/good using SPC                             Pertinent Vitals/Pain Pain Assessment Pain Assessment: Faces Faces Pain Scale: Hurts a little bit Pain Location: mild left chest, left flank pain Pain Descriptors / Indicators: Discomfort Pain Intervention(s): Monitored during session    Home Living Family/patient expects to  be discharged to:: Private residence Living Arrangements: Alone Available Help at Discharge: Family;Available 24 hours/day Type of Home: House Home Access: Stairs to enter Entrance Stairs-Rails: None Entrance Stairs-Number of Steps: 1   Home Layout: One level Home Equipment: Agricultural Consultant (2 wheels);Cane - single point      Prior Function Prior  Level of Function : Independent/Modified Independent;Driving             Mobility Comments: Community ambulation using SPC, drives, shops ADLs Comments: Independent     Extremity/Trunk Assessment   Upper Extremity Assessment Upper Extremity Assessment: Defer to OT evaluation    Lower Extremity Assessment Lower Extremity Assessment: Overall WFL for tasks assessed    Cervical / Trunk Assessment Cervical / Trunk Assessment: Normal  Communication   Communication Communication: No apparent difficulties    Cognition Arousal: Alert Behavior During Therapy: WFL for tasks assessed/performed   PT - Cognitive impairments: No apparent impairments                         Following commands: Intact       Cueing Cueing Techniques: Verbal cues     General Comments      Exercises     Assessment/Plan    PT Assessment All further PT needs can be met in the next venue of care  PT Problem List Decreased strength;Decreased activity tolerance;Decreased balance;Decreased mobility       PT Treatment Interventions      PT Goals (Current goals can be found in the Care Plan section)  Acute Rehab PT Goals Patient Stated Goal: return home with family to assist PT Goal Formulation: With patient/family Time For Goal Achievement: 04/23/24 Potential to Achieve Goals: Good    Frequency       Co-evaluation               AM-PAC PT 6 Clicks Mobility  Outcome Measure Help needed turning from your back to your side while in a flat bed without using bedrails?: None Help needed moving from lying on your back to sitting on the side of a flat bed without using bedrails?: None Help needed moving to and from a bed to a chair (including a wheelchair)?: A Little Help needed standing up from a chair using your arms (e.g., wheelchair or bedside chair)?: None Help needed to walk in hospital room?: A Little Help needed climbing 3-5 steps with a railing? : A Little 6 Click  Score: 21    End of Session Equipment Utilized During Treatment: Oxygen  Activity Tolerance: Patient tolerated treatment well;Patient limited by fatigue Patient left: in chair;with call bell/phone within reach Nurse Communication: Mobility status PT Visit Diagnosis: Unsteadiness on feet (R26.81);Other abnormalities of gait and mobility (R26.89);Muscle weakness (generalized) (M62.81)    Time: 9044-8974 PT Time Calculation (min) (ACUTE ONLY): 30 min   Charges:   PT Evaluation $PT Eval Moderate Complexity: 1 Mod PT Treatments $Therapeutic Activity: 23-37 mins PT General Charges $$ ACUTE PT VISIT: 1 Visit         1:29 PM, 04/23/2024 Lynwood Music, MPT Physical Therapist with [facility] [phone] office [phone] mobile phone

## 2024-04-23 NOTE — Progress Notes (Signed)
 Patient placed in OBSERVATION on 1/3 for Chest Pain. Inpatient Care Manager (ICM) conducted chart review and visited with patient at bedside to complete brief admission assessment.    Patient was sitting up in chair when CM arrived and confirmed she lives at home alone, but her G-Son lives downstairs in her basement Apartment and is available to help if she needs him.  She drives sometimes and can rely on family to drive her to appointments.  She is independent with ADLs with baseline reliance on her Cane or Rollator for ambulation, also has a shower chair available in addition to continuous Home O2 2L thru [employer].   No discharge needs identified at this time. However, IPCM team will continue to follow along and monitor patient advancement through interdisciplinary progression rounds. If new patient transition needs arise, please enter a ICM consult to prompt IPCM team to follow up.    04/23/24 1125  TOC Brief Assessment  Insurance and Status Reviewed  Patient has primary care physician Yes  Home environment has been reviewed Home with self-care  Prior level of function: Independent wiht baseline reliance on Cane or Rollator  Prior/Current Home Services No current home services  Social Drivers of Health Review SDOH reviewed no interventions necessary  Readmission risk has been reviewed Yes  Transition of care needs no transition of care needs at this time

## 2024-04-23 NOTE — ED Provider Notes (Signed)
 " [facility] MEDICAL SURGICAL UNIT Provider Note   CSN: [id number] Arrival date & time: 04/22/24  1719     Patient presents with: Chest Pain   Cindy [name] is a 89 y.o. female.   Patient complains of left-sided chest discomfort.  Patient has a history of chronic hypoxia  The history is provided by the patient and medical records.  Chest Pain Pain location:  L chest Pain quality: aching   Pain radiates to:  Does not radiate Pain severity:  Mild Onset quality:  Sudden Timing:  Intermittent Progression:  Waxing and waning Chronicity:  New Context: not breathing   Relieved by:  Nothing Associated symptoms: no abdominal pain, no back pain, no cough, no fatigue and no headache        Prior to Admission medications  Medication Sig Start Date End Date Taking? Authorizing Provider  acetaminophen  (TYLENOL ) 650 MG CR tablet Take 650 mg by mouth every 8 (eight) hours as needed for pain or fever.    [provider]  albuterol  (PROVENTIL ) (2.5 MG/3ML) 0.083% nebulizer solution Take 3 mLs (2.5 mg total) by nebulization every 6 (six) hours as needed for wheezing or shortness of breath. 02/17/24 02/16/25  Alghanim, Paula, MD  albuterol  (PROVENTIL ) (2.5 MG/3ML) 0.083% nebulizer solution Take 3 mLs (2.5 mg total) by nebulization every 6 (six) hours as needed for wheezing or shortness of breath. 2-3 times a week 02/17/24   Alghanim, Paula, MD  ALPRAZolam  (XANAX ) 0.5 MG tablet Take 1 tablet (0.5 mg total) by mouth at bedtime as needed for anxiety. 03/15/13   Levern Beckey HERO, MD  apixaban  (ELIQUIS ) 2.5 MG TABS tablet Take 1 tablet (2.5 mg total) by mouth 2 (two) times daily. 09/21/23   Mallipeddi, Vishnu P, MD  Cholecalciferol  50 MCG (2000 UT) CAPS Take 1 capsule by mouth daily.    [provider]  clobetasol (TEMOVATE) 0.05 % external solution Apply 1 Application topically 2 (two) times daily. 09/17/23   [provider]  cyanocobalamin (VITAMIN B12) 1000 MCG tablet Take  1,000 mcg by mouth daily.    [provider]  FARXIGA  10 MG TABS tablet TAKE 1 TABLET BY MOUTH DAILY 02/08/24   Mallipeddi, Vishnu P, MD  fentaNYL  (DURAGESIC ) 25 MCG/HR Place 1 patch onto the skin every 3 (three) days. 06/15/19   [provider]  Fluocinolone Acetonide 0.01 % OIL Place 5 drops into both ears as needed (itching). As needed for itching 08/14/21   [provider]  levothyroxine  (SYNTHROID ) 137 MCG tablet Take 137 mcg by mouth daily. 02/16/22   [provider]  metoprolol  succinate (TOPROL -XL) 25 MG 24 hr tablet Take 0.5-1 tablets (12.5-25 mg total) by mouth in the morning and at bedtime. Patient takes 1 tablet (25mg ) in the morning and 1/2 of a tablet (12.5mg ) in the evening Patient taking differently: Take 25 mg by mouth daily. Takes 1/2 07/10/19   Ricky Fines, MD  Multiple Vitamins-Minerals (PRESERVISION AREDS PO) Take 1 capsule by mouth daily.    [provider]  [facility] powder Apply 1 Application topically 2 (two) times daily.    [provider]  ondansetron  (ZOFRAN ) 4 MG tablet Take 4 mg by mouth every 8 (eight) hours as needed for nausea or vomiting. 08/03/19   [provider]  Polyethyl Glycol-Propyl Glycol (SYSTANE OP) Apply 1 drop to eye daily.    [provider]  potassium chloride  (KLOR-CON  M) 10 MEQ tablet Take 10 mEq by mouth daily. 02/05/22   [provider]  Probiotic Product (ALIGN) 4 MG CAPS Take 1 tablet by mouth every morning. Patient not taking: Reported on 03/21/2024    [provider]  senna (SENOKOT) 8.6 MG tablet Take 2 tablets by mouth as needed for constipation.    [provider]  sodium chloride  (OCEAN) 0.65 % nasal spray Place 2 sprays into the nose daily as needed for congestion.    [provider]  torsemide  (DEMADEX ) 20 MG tablet Take 20 mg (1 tablet) daily alternating with 40 mg (2 tablets) 03/07/24   Mallipeddi, Vishnu P, MD    Allergies:  Nebivolol hcl, Codeine, Sulfa antibiotics, and Sulfonamide derivatives    Review of Systems  Constitutional:  Negative for appetite change and fatigue.  HENT:  Negative for congestion, ear discharge and sinus pressure.   Eyes:  Negative for discharge.  Respiratory:  Negative for cough.   Cardiovascular:  Positive for chest pain.  Gastrointestinal:  Negative for abdominal pain and diarrhea.  Genitourinary:  Negative for frequency and hematuria.  Musculoskeletal:  Negative for back pain.  Skin:  Negative for rash.  Neurological:  Negative for seizures and headaches.  Psychiatric/Behavioral:  Negative for hallucinations.     Updated Vital Signs BP (!) 102/56 (BP Location: Left Arm)   Pulse (!) 59   Temp 98 F (36.7 C) (Oral)   Resp 18   Ht 5' 7 (1.702 m)   Wt 54.2 kg   SpO2 98%   BMI 18.70 kg/m   Physical Exam Vitals and nursing note reviewed.  Constitutional:      Appearance: She is well-developed.  HENT:     Head: Normocephalic.     Nose: Nose normal.  Eyes:     General: No scleral icterus.    Conjunctiva/sclera: Conjunctivae normal.  Neck:     Thyroid : No thyromegaly.  Cardiovascular:     Rate and Rhythm: Normal rate and regular rhythm.     Heart sounds: No murmur heard.    No friction rub. No gallop.  Pulmonary:     Breath sounds: No stridor. No wheezing or rales.  Chest:     Chest wall: No tenderness.  Abdominal:     General: There is no distension.     Tenderness: There is no abdominal tenderness. There is no rebound.  Musculoskeletal:        General: Normal range of motion.     Cervical back: Neck supple.  Lymphadenopathy:     Cervical: No cervical adenopathy.  Skin:    Findings: No erythema or rash.  Neurological:     Mental Status: She is alert and oriented to person, place, and time.     Motor: No abnormal muscle tone.     Coordination: Coordination normal.  Psychiatric:        Behavior: Behavior normal.     (all labs ordered are listed, but  only abnormal results are displayed) Labs Reviewed  BASIC METABOLIC PANEL WITH GFR - Abnormal; Notable for the following components:      Result Value   Chloride 97 (*)    Anion gap 18 (*)    All other components within normal limits  CBC - Abnormal; Notable for the following components:   MCV 101.4 (*)    All other components within normal limits  PRO BRAIN NATRIURETIC PEPTIDE - Abnormal; Notable for the following components:   Pro Brain Natriuretic Peptide 597.0 (*)    All other components within normal limits  HEPATIC FUNCTION PANEL - Abnormal; Notable  for the following components:   Alkaline Phosphatase 141 (*)    Total Bilirubin 1.4 (*)    Bilirubin, Direct 0.7 (*)    All other components within normal limits  CBC - Abnormal; Notable for the following components:   RBC 3.85 (*)    Platelets 147 (*)    All other components within normal limits  BASIC METABOLIC PANEL WITH GFR - Abnormal; Notable for the following components:   CO2 33 (*)    Calcium  8.7 (*)    All other components within normal limits  MAGNESIUM   PHOSPHORUS  TROPONIN T, HIGH SENSITIVITY  TROPONIN T, HIGH SENSITIVITY    EKG: EKG Interpretation Date/Time:  Saturday April 22 2024 17:39:54 EST Ventricular Rate:  68 PR Interval:  203 QRS Duration:  106 QT Interval:  427 QTC Calculation: 455 R Axis:   -60  Text Interpretation: Sinus or ectopic atrial rhythm Multiple premature complexes, vent & supraven Left anterior fascicular block Anterior infarct, old Confirmed by Suzette Pac [phone]) on 04/22/2024 6:11:37 PM  Radiology: ECHOCARDIOGRAM COMPLETE Result Date: 04/23/2024    ECHOCARDIOGRAM REPORT   Patient Name:   Cindy [name] Date of Exam: 04/23/2024 Medical Rec #:  [id number]          Height:       67.0 in Accession #:    [id number]         Weight:       119.4 lb Date of Birth:  08/27/1932          BSA:          1.624 m Patient Age:    89 years           BP:           97/59 mmHg Patient Gender: F                   HR:           64 bpm. Exam Location:  [facility] Procedure: 2D Echo, Cardiac Doppler and Color Doppler (Both Spectral and Color            Flow Doppler were utilized during procedure). Indications:    Chest Pain R07.9  History:        Patient has prior history of Echocardiogram examinations, most                 recent 09/18/2022. CHF; Arrythmias:Atrial Fibrillation.  Sonographer:    Jayson Gaskins Referring Phys: [id number] CAROLE N HALL IMPRESSIONS  1. Left ventricular ejection fraction, by estimation, is 60 to 65%. The left ventricle has normal function. The left ventricle has no regional wall motion abnormalities. Left ventricular diastolic function could not be evaluated.  2. Right ventricular systolic function is normal. The right ventricular size is normal. There is moderately elevated pulmonary artery systolic pressure. The estimated right ventricular systolic pressure is 56.4 mmHg.  3. Right atrial size was severely dilated.  4. The mitral valve is rheumatic. Mild to moderate mitral valve regurgitation. No evidence of mitral stenosis.  5. Tricuspid valve regurgitation is mild to moderate.  6. The aortic valve is tricuspid. Aortic valve regurgitation is not visualized. Aortic valve sclerosis/calcification is present, without any evidence of aortic stenosis. Aortic valve area, by VTI measures 2.03 cm. Aortic valve mean gradient measures 3.0 mmHg. Aortic valve Vmax measures 1.15 m/s.  7. The inferior vena cava is normal in size with <50% respiratory variability, suggesting right atrial pressure of 8 mmHg. FINDINGS  Left Ventricle: Left ventricular ejection fraction, by estimation, is 60 to 65%. The left ventricle has normal function. The left ventricle has no regional wall motion abnormalities. The left ventricular internal cavity size was normal in size. There is  no left ventricular hypertrophy. Left ventricular diastolic function could not be evaluated. Right Ventricle: The right ventricular size is  normal. No increase in right ventricular wall thickness. Right ventricular systolic function is normal. There is moderately elevated pulmonary artery systolic pressure. The tricuspid regurgitant velocity is 3.48 m/s, and with an assumed right atrial pressure of 8 mmHg, the estimated right ventricular systolic pressure is 56.4 mmHg. Left Atrium: Left atrial size was normal in size. Right Atrium: Right atrial size was severely dilated. Pericardium: There is no evidence of pericardial effusion. Mitral Valve: The mitral valve is rheumatic. There is moderate thickening of the mitral valve leaflet(s). There is mild calcification of the mitral valve leaflet(s). Mild to moderate mitral valve regurgitation. No evidence of mitral valve stenosis. Tricuspid Valve: The tricuspid valve is normal in structure. Tricuspid valve regurgitation is mild to moderate. No evidence of tricuspid stenosis. Aortic Valve: The aortic valve is tricuspid. Aortic valve regurgitation is not visualized. Aortic valve sclerosis/calcification is present, without any evidence of aortic stenosis. Aortic valve mean gradient measures 3.0 mmHg. Aortic valve peak gradient measures 5.3 mmHg. Aortic valve area, by VTI measures 2.03 cm. Pulmonic Valve: The pulmonic valve was normal in structure. Pulmonic valve regurgitation is not visualized. No evidence of pulmonic stenosis. Aorta: The aortic root is normal in size and structure. Venous: The inferior vena cava is normal in size with less than 50% respiratory variability, suggesting right atrial pressure of 8 mmHg. IAS/Shunts: No atrial level shunt detected by color flow Doppler. Additional Comments: A device lead is visualized.  LEFT VENTRICLE PLAX 2D LVIDd:         3.60 cm LVIDs:         2.40 cm LV PW:         0.90 cm LV IVS:        0.90 cm LVOT diam:     1.90 cm LV SV:         46 LV SV Index:   29 LVOT Area:     2.84 cm  LEFT ATRIUM           Index LA Vol (A4C): 25.7 ml 15.82 ml/m  AORTIC VALVE AV Area  (Vmax):    1.82 cm AV Area (Vmean):   1.96 cm AV Area (VTI):     2.03 cm AV Vmax:           115.00 cm/s AV Vmean:          83.600 cm/s AV VTI:            0.229 m AV Peak Grad:      5.3 mmHg AV Mean Grad:      3.0 mmHg LVOT Vmax:         73.90 cm/s LVOT Vmean:        57.800 cm/s LVOT VTI:          0.164 m LVOT/AV VTI ratio: 0.72  AORTA Ao Root diam: 3.10 cm MITRAL VALVE                TRICUSPID VALVE MV Area (PHT): 3.34 cm     TR Peak grad:   48.4 mmHg MV Decel Time: 227 msec     TR Vmax:  348.00 cm/s MV E velocity: 149.00 cm/s                             SHUNTS                             Systemic VTI:  0.16 m                             Systemic Diam: 1.90 cm Wilbert Bihari MD Electronically signed by Wilbert Bihari MD Signature Date/Time: 04/23/2024/11:26:46 AM    Final    CT Angio Chest/Abd/Pel for Dissection W and/or Wo Contrast Result Date: 04/22/2024 EXAM: CTA CHEST, ABDOMEN AND PELVIS WITHOUT AND WITH CONTRAST 04/22/2024 06:48:37 PM TECHNIQUE: CTA of the chest was performed without and with the administration of 100 mL of intravenous iohexol  (OMNIPAQUE ) 350 MG/ML injection. CTA of the abdomen and pelvis was performed without and with the administration of 100 mL of intravenous iohexol  (OMNIPAQUE ) 350 MG/ML injection. Multiplanar reformatted images are provided for review. MIP images are provided for review. Automated exposure control, iterative reconstruction, and/or weight based adjustment of the mA/kV was utilized to reduce the radiation dose to as low as reasonably achievable. COMPARISON: 10/08/2022 CLINICAL HISTORY: Acute aortic syndrome (AAS) suspected. FINDINGS: VASCULATURE: AORTA: Coronary artery and aortic atherosclerosis. No aneurysm or dissection. PULMONARY ARTERIES: No pulmonary embolism with the limits of this exam. GREAT VESSELS OF AORTIC ARCH: Coronary artery and aortic atherosclerosis. No dissection. No arterial occlusion or significant stenosis. CELIAC TRUNK: No acute finding. No  occlusion or significant stenosis. SUPERIOR MESENTERIC ARTERY: Narrowing at the origin and within the proximal SMA, likely 50% stenosis. INFERIOR MESENTERIC ARTERY: No acute finding. No occlusion or significant stenosis. RENAL ARTERIES: No acute finding. No occlusion or significant stenosis. ILIAC ARTERIES: No acute finding. No occlusion or significant stenosis. CHEST: MEDIASTINUM: Pacer wires in the right heart. Coronary artery and aortic atherosclerosis. No mediastinal lymphadenopathy. The heart and pericardium demonstrate no acute abnormality. LUNGS AND PLEURA: Mosaic attenuation throughout the lungs is compatible with air trapping. Bilateral upper lobes scarring, band-like scarring in the lower lobes bilaterally. Trace right pleural effusion. No pneumothorax. THORACIC BONES AND SOFT TISSUES: No acute bone or soft tissue abnormality. ABDOMEN AND PELVIS: LIVER: Scattered hypodensities in the liver, the largest 2.2 cm most compatible with cysts. GALLBLADDER AND BILE DUCTS: Gallbladder is unremarkable. No biliary ductal dilatation. SPLEEN: The spleen is unremarkable. PANCREAS: The pancreas is unremarkable. ADRENAL GLANDS: Bilateral adrenal glands demonstrate no acute abnormality. KIDNEYS, URETERS AND BLADDER: Scattered hypodensities in the left kidney, the largest measuring 2.2 cm most compatible with simple cysts. No stones in the kidneys or ureters. No hydronephrosis. No perinephric or periureteral stranding. Urinary bladder is unremarkable. GI AND BOWEL: Diffuse colonic diverticulosis. No active diverticulitis. Stomach and duodenal sweep demonstrate no acute abnormality. There is no bowel obstruction. No abnormal bowel wall thickening or distension. REPRODUCTIVE: Prior hysterectomy. PERITONEUM AND RETROPERITONEUM: No ascites or free air. LYMPH NODES: No lymphadenopathy. ABDOMINAL BONES AND SOFT TISSUES: No acute abnormality of the bones. No acute soft tissue abnormality. IMPRESSION: 1. No evidence of acute aortic  syndrome. 2. No pulmonary embolus. 3. Narrowing at the origin and within the proximal SMA, likely 50% stenosis. 4. Coronary artery and aortic atherosclerosis. 5. Diffuse colonic diverticulosis. No active diverticulitis. Electronically signed by: Franky Crease MD 04/22/2024 07:35 PM EST RP Workstation: [id number]  DG Chest Portable 1 View Result Date: 04/22/2024 EXAM: 1 VIEW(S) XRAY OF THE CHEST 04/22/2024 06:10:00 PM COMPARISON: Comparison with 09/02/2023. CLINICAL HISTORY: Shortness of breath and chest pain starting this afternoon. FINDINGS: LINES, TUBES AND DEVICES: Cardiac pacemaker. LUNGS AND PLEURA: Interstitial changes in the lung bases may be due to chronic fibrosis or edema. Similar appearance to prior study. Emphysematous changes and scattered fibrosis in the lungs. Small bilateral pleural effusions. No pneumothorax. HEART AND MEDIASTINUM: Mild cardiac enlargement with mild vascular congestion. Calcification of the aorta. BONES AND SOFT TISSUES: Degenerative changes in the spine. No acute osseous abnormality. IMPRESSION: 1. Mild cardiac enlargement with mild vascular congestion, similar to prior study. 2. Interstitial changes in the lung bases, which may be due to chronic fibrosis or edema, similar to prior study. 3. Small bilateral pleural effusions. No pneumothorax. Electronically signed by: Elsie Gravely MD 04/22/2024 06:23 PM EST RP Workstation: [id number]     Procedures   Medications Ordered in the ED  apixaban  (ELIQUIS ) tablet 2.5 mg (2.5 mg Oral Given 04/23/24 0851)  levothyroxine  (SYNTHROID ) tablet 137 mcg (137 mcg Oral Given 04/23/24 0441)  acetaminophen  (TYLENOL ) tablet 500 mg (has no administration in time range)  prochlorperazine  (COMPAZINE ) injection 5 mg (has no administration in time range)  polyethylene glycol (MIRALAX  / GLYCOLAX ) packet 17 g (has no administration in time range)  melatonin tablet 6 mg (6 mg Oral Given 04/22/24 2145)  torsemide  (DEMADEX ) tablet 20 mg (20 mg Oral  Given 04/22/24 2140)  potassium chloride  (KLOR-CON  M) CR tablet 10 mEq (10 mEq Oral Given 04/23/24 0851)  metoprolol  succinate (TOPROL -XL) 24 hr tablet 12.5 mg (0 mg Oral Hold 04/23/24 0814)  torsemide  (DEMADEX ) tablet 40 mg (0 mg Oral Hold 04/23/24 0814)  oxyCODONE -acetaminophen  (PERCOCET/ROXICET) 5-325 MG per tablet 1 tablet (1 tablet Oral Given 04/22/24 1922)  iohexol  (OMNIPAQUE ) 350 MG/ML injection 100 mL (100 mLs Intravenous Contrast Given 04/22/24 1842)                                    Medical Decision Making Amount and/or Complexity of Data Reviewed Labs: ordered. Radiology: ordered.  Risk Prescription drug management. Decision regarding hospitalization.  Patient with atypical chest discomfort.  She will be admitted to medicine and observed     Final diagnoses:  Atypical chest pain    ED Discharge Orders          Ordered    Discharge instructions       Comments: Follow heart healthy/low-sodium diet Maintain adequate hydration Arrange follow-up with PCP in 10 days Follow-up with cardiology service as previously instructed Take medications as prescribed.   04/23/24 1427    (HEART FAILURE PATIENTS) Call MD:  Anytime you have any of the following symptoms: 1) 3 pound weight gain in 24 hours or 5 pounds in 1 week 2) shortness of breath, with or without a dry hacking cough 3) swelling in the hands, feet or stomach 4) if you have to sleep on extra pillows at night in order to breathe.        04/23/24 1427               Suzette Pac, MD 04/23/24 1630  "

## 2024-04-23 NOTE — Care Management Obs Status (Signed)
 MEDICARE OBSERVATION STATUS NOTIFICATION   Patient Details  Name: Cindy [name] MRN: [id number] Date of Birth: 05-Nov-1932   Medicare Observation Status Notification Given:  Yes    Ronnald MARLA Sil, RN 04/23/2024, 10:51 AM

## 2024-04-24 ENCOUNTER — Encounter (HOSPITAL_COMMUNITY)

## 2024-04-26 ENCOUNTER — Encounter (HOSPITAL_COMMUNITY)

## 2024-05-01 ENCOUNTER — Encounter (HOSPITAL_COMMUNITY)
Admission: RE | Admit: 2024-05-01 | Discharge: 2024-05-01 | Disposition: A | Discharge status: Home / Self Care | Source: Ambulatory Visit | Attending: Pulmonary Disease | Admitting: Pulmonary Disease

## 2024-05-01 ENCOUNTER — Encounter (HOSPITAL_COMMUNITY)

## 2024-05-01 DIAGNOSIS — J9611 Chronic respiratory failure with hypoxia: Secondary | ICD-10-CM | POA: Insufficient documentation

## 2024-05-01 DIAGNOSIS — I272 Pulmonary hypertension, unspecified: Secondary | ICD-10-CM | POA: Insufficient documentation

## 2024-05-01 DIAGNOSIS — J849 Interstitial pulmonary disease, unspecified: Secondary | ICD-10-CM | POA: Insufficient documentation

## 2024-05-01 NOTE — Progress Notes (Signed)
 Daily Session Note  Patient Details  Name: Cindy [name] MRN: [id number] Date of Birth: 1932/06/26 Referring Provider:   Flowsheet Row PULMONARY REHAB OTHER RESP ORIENTATION from 03/06/2024 in [facility] CARDIAC RE[facility]ATION  Referring Provider Len Cools MD    Encounter Date: 05/01/2024  Check In:  Session Check In - 05/01/24 1433       Check-In   Supervising physician immediately available to respond to emergencies See telemetry face sheet for immediately available MD    Location [facility]    Staff Present Powell Benders, BS, Exercise Physiologist;Brittany Jackquline, BSN, RN, WTA-C;Victoria Zina, RN    Virtual Visit No    Medication changes reported     No    Fall or balance concerns reported    No    Tobacco Cessation No Change    Warm-up and Cool-down Performed on first and last piece of equipment    Resistance Training Performed Yes    VAD Patient? No    PAD/SET Patient? No      Pain Assessment   Currently in Pain? No/denies    Pain Score 0-No pain    Multiple Pain Sites No          Capillary Blood Glucose: No results found for this or any previous visit (from the past 24 hours).    Tobacco Use History[1]  Goals Met:  Independence with exercise equipment Using PLB without cueing & demonstrates good technique Exercise tolerated well Queuing for purse lip breathing No report of concerns or symptoms today Strength training completed today  Goals Unmet:  Not Applicable  Comments: Pt able to follow exercise prescription today without complaint.  Will continue to monitor for progression.        [1]  Social History Tobacco Use  Smoking Status Never   Passive exposure: Never  Smokeless Tobacco Never

## 2024-05-03 ENCOUNTER — Encounter (HOSPITAL_COMMUNITY): Admission: RE | Admit: 2024-05-03 | Discharge: 2024-05-03 | Attending: Pulmonary Disease | Admitting: Pulmonary Disease

## 2024-05-03 ENCOUNTER — Encounter (HOSPITAL_COMMUNITY)

## 2024-05-03 DIAGNOSIS — J849 Interstitial pulmonary disease, unspecified: Secondary | ICD-10-CM

## 2024-05-03 DIAGNOSIS — J9611 Chronic respiratory failure with hypoxia: Secondary | ICD-10-CM | POA: Diagnosis present

## 2024-05-03 DIAGNOSIS — I272 Pulmonary hypertension, unspecified: Secondary | ICD-10-CM | POA: Diagnosis present

## 2024-05-03 NOTE — Progress Notes (Signed)
 Daily Session Note  Patient Details  Name: Cindy [name] MRN: [id number] Date of Birth: 05-17-1932 Referring Provider:   Flowsheet Row PULMONARY REHAB OTHER RESP ORIENTATION from 03/06/2024 in [facility] CARDIAC REHAB[facility]  Referring Provider Len Cools MD    Encounter Date: 05/03/2024  Check In:  Session Check In - 05/03/24 1418       Check-In   Supervising physician immediately available to respond to emergencies See telemetry face sheet for immediately available MD    Location [facility]    Staff Present Powell Benders, BS, Exercise Physiologist;Ethie Curless Dean BSN, RN;Brittany Jackquline, BSN, RN, WTA-C    Virtual Visit No    Medication changes reported     No    Fall or balance concerns reported    No    Tobacco Cessation No Change    Warm-up and Cool-down Performed on first and last piece of equipment    Resistance Training Performed Yes    VAD Patient? No    PAD/SET Patient? No      Pain Assessment   Currently in Pain? No/denies    Pain Score 0-No pain    Multiple Pain Sites No          Capillary Blood Glucose: No results found for this or any previous visit (from the past 24 hours).    Tobacco Use History[1]  Goals Met:  Proper associated with RPD/PD & O2 Sat Independence with exercise equipment Using PLB without cueing & demonstrates good technique Exercise tolerated well Queuing for purse lip breathing No report of concerns or symptoms today Strength training completed today  Goals Unmet:  Not Applicable  Comments: SABRASABRAPt able to follow exercise prescription today without complaint.  Will continue to monitor for progression.        [1]  Social History Tobacco Use  Smoking Status Never   Passive exposure: Never  Smokeless Tobacco Never

## 2024-05-08 ENCOUNTER — Encounter (HOSPITAL_COMMUNITY)

## 2024-05-08 ENCOUNTER — Encounter (HOSPITAL_COMMUNITY)
Admission: RE | Admit: 2024-05-08 | Discharge: 2024-05-08 | Disposition: A | Source: Ambulatory Visit | Attending: Pulmonary Disease | Admitting: Pulmonary Disease

## 2024-05-08 DIAGNOSIS — J9611 Chronic respiratory failure with hypoxia: Secondary | ICD-10-CM

## 2024-05-08 DIAGNOSIS — J849 Interstitial pulmonary disease, unspecified: Secondary | ICD-10-CM

## 2024-05-08 DIAGNOSIS — I272 Pulmonary hypertension, unspecified: Secondary | ICD-10-CM

## 2024-05-08 NOTE — Progress Notes (Signed)
 Daily Session Note  Patient Details  Name: Cindy [name] MRN: [id number] Date of Birth: 1933/04/16 Referring Provider:   Flowsheet Row PULMONARY REHAB OTHER RESP ORIENTATION from 03/06/2024 in [facility] CARDIAC REHABILITATION  Referring Provider Len Cools MD    Encounter Date: 05/08/2024  Check In:  Session Check In - 05/08/24 1425       Check-In   Supervising physician immediately available to respond to emergencies See telemetry face sheet for immediately available MD    Location [facility]    Staff Present Powell Benders, BS, Exercise Physiologist;Johnpatrick Jenny Zina, RN;Brittany Jackquline, BSN, RN, WTA-C    Virtual Visit No    Medication changes reported     No    Fall or balance concerns reported    No    Warm-up and Cool-down Performed on first and last piece of equipment    Resistance Training Performed Yes    VAD Patient? No    PAD/SET Patient? No      Pain Assessment   Currently in Pain? No/denies          Capillary Blood Glucose: No results found for this or any previous visit (from the past 24 hours).    Tobacco Use History[1]  Goals Met:  Proper associated with RPD/PD & O2 Sat Independence with exercise equipment Using PLB without cueing & demonstrates good technique Exercise tolerated well No report of concerns or symptoms today Strength training completed today  Goals Unmet:  Not Applicable  Comments: Pt able to follow exercise prescription today without complaint.  Will continue to monitor for progression.        [1]  Social History Tobacco Use  Smoking Status Never   Passive exposure: Never  Smokeless Tobacco Never

## 2024-05-10 ENCOUNTER — Encounter (HOSPITAL_COMMUNITY)

## 2024-05-10 ENCOUNTER — Encounter (HOSPITAL_COMMUNITY)
Admission: RE | Admit: 2024-05-10 | Discharge: 2024-05-10 | Disposition: A | Source: Ambulatory Visit | Attending: Pulmonary Disease | Admitting: Pulmonary Disease

## 2024-05-10 DIAGNOSIS — J849 Interstitial pulmonary disease, unspecified: Secondary | ICD-10-CM

## 2024-05-10 DIAGNOSIS — J9611 Chronic respiratory failure with hypoxia: Secondary | ICD-10-CM

## 2024-05-10 DIAGNOSIS — I272 Pulmonary hypertension, unspecified: Secondary | ICD-10-CM

## 2024-05-10 NOTE — Progress Notes (Signed)
 Daily Session Note  Patient Details  Name: Cindy [name] MRN: [id number] Date of Birth: [birth year] Referring Provider:   Flowsheet Row PULMONARY REHAB OTHER RESP ORIENTATION from 03/06/2024 in [facility] CARDIAC REHABILITATION  Referring Provider Len Cools MD    Encounter Date: 05/10/2024  Check In:  Session Check In - 05/10/24 1416       Check-In   Supervising physician immediately available to respond to emergencies See telemetry face sheet for immediately available MD    Location [facility]    Staff Present Powell Benders, BS, Exercise Physiologist;Donte Lenzo Dean BSN, RN;Victoria Zina, RN    Virtual Visit No    Medication changes reported     No    Fall or balance concerns reported    No    Tobacco Cessation No Change    Warm-up and Cool-down Performed on first and last piece of equipment    Resistance Training Performed Yes    VAD Patient? No    PAD/SET Patient? No      Pain Assessment   Currently in Pain? No/denies    Pain Score 0-No pain    Multiple Pain Sites No          Capillary Blood Glucose: No results found for this or any previous visit (from the past 24 hours).    Tobacco Use History[1]  Goals Met:  Proper associated with RPD/PD & O2 Sat Independence with exercise equipment Using PLB without cueing & demonstrates good technique Exercise tolerated well Queuing for purse lip breathing No report of concerns or symptoms today Strength training completed today  Goals Unmet:  Not Applicable  Comments: SABRASABRAPt able to follow exercise prescription today without complaint.  Will continue to monitor for progression.        [1]  Social History Tobacco Use  Smoking Status Never   Passive exposure: Never  Smokeless Tobacco Never

## 2024-05-15 ENCOUNTER — Encounter (HOSPITAL_COMMUNITY)

## 2024-05-16 ENCOUNTER — Encounter (HOSPITAL_COMMUNITY): Payer: Self-pay | Admitting: *Deleted

## 2024-05-16 DIAGNOSIS — I272 Pulmonary hypertension, unspecified: Secondary | ICD-10-CM

## 2024-05-16 DIAGNOSIS — J9611 Chronic respiratory failure with hypoxia: Secondary | ICD-10-CM

## 2024-05-16 DIAGNOSIS — J849 Interstitial pulmonary disease, unspecified: Secondary | ICD-10-CM

## 2024-05-16 NOTE — Progress Notes (Signed)
 Pulmonary Individual Treatment Plan  Patient Details  Name: Cindy [name] MRN: [id number] Date of Birth: 07/04/1932 Referring Provider:   Flowsheet Row PULMONARY REHAB OTHER RESP ORIENTATION from 03/06/2024 in [facility] CARDIAC REHABILITATION  Referring Provider Len Cools MD    Initial Encounter Date:  Flowsheet Row PULMONARY REHAB OTHER RESP ORIENTATION from 03/06/2024 in [facility]IAC [facility]  Date 03/06/24    Visit Diagnosis: Chronic respiratory failure with hypoxia (HCC)  Pulmonary HTN (HCC)  ILD (interstitial lung disease) (HCC)  Patient's Home Medications on Admission:  Current Medications[1]  Past Medical History: Past Medical History:  Diagnosis Date   Amyloidosis (HCC)    cancer   Cancer (HCC)    CHF (congestive heart failure) (HCC)    Amyloidosis   Diarrhea    Diverticulosis of colon (without mention of hemorrhage)    Edema    Esophagitis, unspecified    Family history of malignant neoplasm of gastrointestinal tract    Fibrosis of lung (HCC) 03/14/2013   Flatulence, eructation, and gas pain    GERD (gastroesophageal reflux disease)    Left sided ulcerative colitis (HCC)    Macular degeneration    left eye   Maxillary sinus mass    Osteoporosis, unspecified    Other and unspecified hyperlipidemia    Other and unspecified noninfectious gastroenteritis and colitis(558.9)    Paroxysmal atrial fibrillation (HCC) 07/09/2019   Peripheral vascular disease    Pneumonia    Stricture and stenosis of esophagus    Unspecified hypothyroidism    URI (upper respiratory infection)     Tobacco Use: Tobacco Use History[2]  Labs: Review Flowsheet  More data may exist      Latest Ref Rng & Units 07/15/2012 03/14/2013 06/29/2013 12/21/2013 04/06/2014  Labs for ITP Cardiac and Pulmonary Rehab  Cholestrol 0 - 200 mg/dL 778  - 799  757  798   LDL (calc) 0 - 99 mg/dL 845  - 859  816  852   HDL-C >39.00 mg/dL 50  - 49  56.29  60.69   Trlycerides  0.0 - 149.0 mg/dL 86  - 57  24.9  26.9   Hemoglobin A1c <5.7 % - 6.1  - - -    Capillary Blood Glucose: Lab Results  Component Value Date   GLUCAP 89 07/08/2019     Pulmonary Assessment Scores:  Pulmonary Assessment Scores     Row Name 03/06/24 1427         ADL UCSD   ADL Phase Entry     SOB Score total 82     Rest 2     Walk 3     Stairs 5     Bath 3     Dress 4     Shop 4       CAT Score   CAT Score 21       mMRC Score   mMRC Score 3       UCSD: Self-administered rating of dyspnea associated with activities of daily living (ADLs) 6-point scale (0 = not at all to 5 = maximal or unable to do because of breathlessness)  Scoring Scores range from 0 to 120.  Minimally important difference is 5 units  CAT: CAT can identify the health impairment of COPD patients and is better correlated with disease progression.  CAT has a scoring range of zero to 40. The CAT score is classified into four groups of low (less than 10), medium (10 - 20), high (21-30) and very  high (31-40) based on the impact level of disease on health status. A CAT score over 10 suggests significant symptoms.  A worsening CAT score could be explained by an exacerbation, poor medication adherence, poor inhaler technique, or progression of COPD or comorbid conditions.  CAT MCID is 2 points  mMRC: mMRC (Modified Medical Research Council) Dyspnea Scale is used to assess the degree of baseline functional disability in patients of respiratory disease due to dyspnea. No minimal important difference is established. A decrease in score of 1 point or greater is considered a positive change.   Pulmonary Function Assessment:  Pulmonary Function Assessment - 03/06/24 1527       Breath   Shortness of Breath Fear of Shortness of Breath;Yes;Limiting activity;Panic with Shortness of Breath          Exercise Target Goals: Exercise Program Goal: Individual exercise prescription set using results from initial 6  min walk test and THRR while considering  patients activity barriers and safety.   Exercise Prescription Goal: Initial exercise prescription builds to 30-45 minutes a day of aerobic activity, 2-3 days per week.  Home exercise guidelines will be given to patient during program as part of exercise prescription that the participant will acknowledge.  Activity Barriers & Risk Stratification:  Activity Barriers & Cardiac Risk Stratification - 02/23/24 1305       Activity Barriers & Cardiac Risk Stratification   Activity Barriers Shortness of Breath;Balance Concerns;Assistive Device;Deconditioning;Arthritis   Uses cane and walker when needed.         6 Minute Walk:  6 Minute Walk     Row Name 03/06/24 1521         6 Minute Walk   Phase Initial     Distance 190 feet     Walk Time 2 minutes     # of Rest Breaks 0  stopped due to desaturation     MPH 1.08     METS 0.48     RPE 17     Perceived Dyspnea  3     VO2 Peak 1.68     Symptoms Yes (comment)     Comments SOB, desaturation     Resting HR 82 bpm     Resting BP 100/62     Resting Oxygen  Saturation  94 %     Exercise Oxygen  Saturation  during 6 min walk 75 %     Max Ex. HR 96 bpm     Max Ex. BP 148/70     2 Minute Post BP 136/74       Interval HR   1 Minute HR 96     2 Minute HR 96     2 Minute Post HR 92     Interval Heart Rate? Yes       Interval Oxygen    Interval Oxygen ? Yes     Baseline Oxygen  Saturation % 94 %     1 Minute Oxygen  Saturation % 85 %     1 Minute Liters of Oxygen  2 L  NCP POC     2 Minute Oxygen  Saturation % 75 %     2 Minute Liters of Oxygen  2 L     2 Minute Post Oxygen  Saturation % 91 %     2 Minute Post Liters of Oxygen  2 L        Oxygen  Initial Assessment:  Oxygen  Initial Assessment - 03/06/24 1527       Home Oxygen    Home Oxygen  Device Home Concentrator;E-Tanks  Sleep Oxygen  Prescription Continuous    Liters per minute 2    Home Exercise Oxygen  Prescription Continuous     Liters per minute 2    Home Resting Oxygen  Prescription Continuous    Liters per minute 2    Compliance with Home Oxygen  Use Yes      Initial 6 min Walk   Oxygen  Used Portable Concentrator;Pulsed    Liters per minute 2      Program Oxygen  Prescription   Program Oxygen  Prescription Continuous;E-Tanks    Liters per minute 2      Intervention   Short Term Goals To learn and exhibit compliance with exercise, home and travel O2 prescription;To learn and understand importance of monitoring SPO2 with pulse oximeter and demonstrate accurate use of the pulse oximeter.;To learn and understand importance of maintaining oxygen  saturations>88%;To learn and demonstrate proper pursed lip breathing techniques or other breathing techniques. ;To learn and demonstrate proper use of respiratory medications    Long  Term Goals Exhibits compliance with exercise, home  and travel O2 prescription;Verbalizes importance of monitoring SPO2 with pulse oximeter and return demonstration;Maintenance of O2 saturations>88%;Exhibits proper breathing techniques, such as pursed lip breathing or other method taught during program session;Compliance with respiratory medication;Demonstrates proper use of MDIs          Oxygen  Re-Evaluation:  Oxygen  Re-Evaluation     Row Name 03/13/24 1110 04/10/24 1141 05/10/24 1445         Program Oxygen  Prescription   Program Oxygen  Prescription -- Continuous;E-Tanks Continuous;E-Tanks     Liters per minute -- 2 --       Home Oxygen    Home Oxygen  Device -- Home Concentrator;E-Tanks Home Concentrator;E-Tanks     Sleep Oxygen  Prescription -- Continuous Continuous     Liters per minute -- 2 2     Home Exercise Oxygen  Prescription -- Continuous Continuous     Liters per minute -- 2 2     Home Resting Oxygen  Prescription -- Continuous Continuous     Liters per minute -- 2 2     Compliance with Home Oxygen  Use -- Yes Yes       Goals/Expected Outcomes   Short Term Goals -- To learn  and exhibit compliance with exercise, home and travel O2 prescription;To learn and understand importance of monitoring SPO2 with pulse oximeter and demonstrate accurate use of the pulse oximeter.;To learn and understand importance of maintaining oxygen  saturations>88%;To learn and demonstrate proper pursed lip breathing techniques or other breathing techniques. ;To learn and demonstrate proper use of respiratory medications To learn and exhibit compliance with exercise, home and travel O2 prescription;To learn and understand importance of monitoring SPO2 with pulse oximeter and demonstrate accurate use of the pulse oximeter.;To learn and understand importance of maintaining oxygen  saturations>88%;To learn and demonstrate proper pursed lip breathing techniques or other breathing techniques. ;To learn and demonstrate proper use of respiratory medications     Long  Term Goals Maintenance of O2 saturations>88%;Exhibits compliance with exercise, home  and travel O2 prescription Maintenance of O2 saturations>88%;Exhibits compliance with exercise, home  and travel O2 prescription;Verbalizes importance of monitoring SPO2 with pulse oximeter and return demonstration;Exhibits proper breathing techniques, such as pursed lip breathing or other method taught during program session;Compliance with respiratory medication;Demonstrates proper use of MDIs Maintenance of O2 saturations>88%;Exhibits compliance with exercise, home  and travel O2 prescription;Verbalizes importance of monitoring SPO2 with pulse oximeter and return demonstration;Exhibits proper breathing techniques, such as pursed lip breathing or other method taught during program session;Compliance  with respiratory medication;Demonstrates proper use of MDIs     Comments Reviewed PLB technique with pt.  Talked about how it works and it's importance in maintaining their exercise saturations. Adean states she has good and bad days with her breathing. She gets SOB often,  especially when she gets in a hurry. She wears 2L all the time, and may increase her portable concentrator to 3L due to her SOB. Adean has been doing well in rehab. She has good and bad breathing days. She get SOB walking long distances and needs to take a break. She will increase her portabnle concentrator to 3L when she is SOB.     Goals/Expected Outcomes Short: Become more profiecient at using PLB.   Long: Become independent at using PLB. Short: Become more profiecient at using PLB.   Long: Become independent at using PLB. Short: Become more profiecient at using PLB.   Long: Become independent at using PLB.        Oxygen  Discharge (Final Oxygen  Re-Evaluation):  Oxygen  Re-Evaluation - 05/10/24 1445       Program Oxygen  Prescription   Program Oxygen  Prescription Continuous;E-Tanks      Home Oxygen    Home Oxygen  Device Home Concentrator;E-Tanks    Sleep Oxygen  Prescription Continuous    Liters per minute 2    Home Exercise Oxygen  Prescription Continuous    Liters per minute 2    Home Resting Oxygen  Prescription Continuous    Liters per minute 2    Compliance with Home Oxygen  Use Yes      Goals/Expected Outcomes   Short Term Goals To learn and exhibit compliance with exercise, home and travel O2 prescription;To learn and understand importance of monitoring SPO2 with pulse oximeter and demonstrate accurate use of the pulse oximeter.;To learn and understand importance of maintaining oxygen  saturations>88%;To learn and demonstrate proper pursed lip breathing techniques or other breathing techniques. ;To learn and demonstrate proper use of respiratory medications    Long  Term Goals Maintenance of O2 saturations>88%;Exhibits compliance with exercise, home  and travel O2 prescription;Verbalizes importance of monitoring SPO2 with pulse oximeter and return demonstration;Exhibits proper breathing techniques, such as pursed lip breathing or other method taught during program session;Compliance with  respiratory medication;Demonstrates proper use of MDIs    Comments Adean has been doing well in rehab. She has good and bad breathing days. She get SOB walking long distances and needs to take a break. She will increase her portabnle concentrator to 3L when she is SOB.    Goals/Expected Outcomes Short: Become more profiecient at using PLB.   Long: Become independent at using PLB.          Initial Exercise Prescription:  Initial Exercise Prescription - 03/06/24 1500       Date of Initial Exercise RX and Referring Provider   Date 03/06/24    Referring Provider Algharnim, Fahid MD      Oxygen    Oxygen  Continuous    Liters 2    Maintain Oxygen  Saturation 88% or higher      NuStep   Level 1    SPM 60    Minutes 15    METs 1.5      REL-XR   Level 1    Speed 50    Minutes 15    METs 1.5      Prescription Details   Frequency (times per week) 2    Duration Progress to 30 minutes of continuous aerobic without signs/symptoms of physical distress  Intensity   THRR 40-80% of Max Heartrate 105-122    Ratings of Perceived Exertion 11-13    Perceived Dyspnea 0-4      Progression   Progression Continue to progress workloads to maintain intensity without signs/symptoms of physical distress.      Resistance Training   Training Prescription Yes    Weight 3 lb    Reps 10-15          Perform Capillary Blood Glucose checks as needed.  Exercise Prescription Changes:   Exercise Prescription Changes     Row Name 03/06/24 1500 04/05/24 1500           Response to Exercise   Blood Pressure (Admit) 100/62 142/66      Blood Pressure (Exercise) 148/70 --      Blood Pressure (Exit) 126/64 110/62      Heart Rate (Admit) 82 bpm 78 bpm      Heart Rate (Exercise) 96 bpm 80 bpm      Heart Rate (Exit) 92 bpm 65 bpm      Oxygen  Saturation (Admit) 94 % 88 %      Oxygen  Saturation (Exercise) 75 % 90 %      Oxygen  Saturation (Exit) 91 % 97 %      Rating of Perceived Exertion  (Exercise) 17 15      Perceived Dyspnea (Exercise) 3 2      Symptoms SOb, destaruation --      Comments walk test results --      Duration -- Continue with 30 min of aerobic exercise without signs/symptoms of physical distress.      Intensity -- THRR unchanged        Progression   Progression -- Continue to progress workloads to maintain intensity without signs/symptoms of physical distress.        Resistance Training   Weight -- 3      Reps -- 10-15        Oxygen    Oxygen  -- Continuous      Liters -- 2        NuStep   Level -- 1      SPM -- 60      Minutes -- 35      METs -- 1.5        Oxygen    Maintain Oxygen  Saturation -- 88% or higher         Exercise Comments:   Exercise Comments     Row Name 03/06/24 1426 03/13/24 1108         Exercise Comments Patient attend orientation today.  Patient is attending Pulmonary Rehabilitation Program.  Documentation for diagnosis can be found in [facility].  Reviewed medical chart, RPE/RPD, gym safety, and program guidelines.  Patient was fitted to equipment they will be using during rehab.  Patient is scheduled to start exercise on 03/13/24.   Initial ITP created and sent for review and signature by Dr. Anton Kelp, Medical Director for Pulmonary Rehabilitation Program. First full day of exercise!  Patient was oriented to gym and equipment including functions, settings, policies, and procedures.  Patient's individual exercise prescription and treatment plan were reviewed.  All starting workloads were established based on the results of the 6 minute walk test done at initial orientation visit.  The plan for exercise progression was also introduced and progression will be customized based on patient's performance and goals.         Exercise Goals and Review:   Exercise Goals  Row Name 03/06/24 1524             Exercise Goals   Increase Physical Activity Yes       Intervention Provide advice, education, support and counseling  about physical activity/exercise needs.;Develop an individualized exercise prescription for aerobic and resistive training based on initial evaluation findings, risk stratification, comorbidities and participant's personal goals.       Expected Outcomes Short Term: Attend rehab on a regular basis to increase amount of physical activity.;Long Term: Exercising regularly at least 3-5 days a week.;Long Term: Add in home exercise to make exercise part of routine and to increase amount of physical activity.       Increase Strength and Stamina Yes       Intervention Provide advice, education, support and counseling about physical activity/exercise needs.;Develop an individualized exercise prescription for aerobic and resistive training based on initial evaluation findings, risk stratification, comorbidities and participant's personal goals.       Expected Outcomes Short Term: Increase workloads from initial exercise prescription for resistance, speed, and METs.;Short Term: Perform resistance training exercises routinely during rehab and add in resistance training at home;Long Term: Improve cardiorespiratory fitness, muscular endurance and strength as measured by increased METs and functional capacity ( )       Able to understand and use rate of perceived exertion (RPE) scale Yes       Intervention Provide education and explanation on how to use RPE scale       Expected Outcomes Long Term:  Able to use RPE to guide intensity level when exercising independently;Short Term: Able to use RPE daily in rehab to express subjective intensity level       Able to understand and use Dyspnea scale Yes       Intervention Provide education and explanation on how to use Dyspnea scale       Expected Outcomes Short Term: Able to use Dyspnea scale daily in rehab to express subjective sense of shortness of breath during exertion;Long Term: Able to use Dyspnea scale to guide intensity level when exercising independently        Knowledge and understanding of Target Heart Rate Range (THRR) Yes       Intervention Provide education and explanation of THRR including how the numbers were predicted and where they are located for reference       Expected Outcomes Short Term: Able to state/look up THRR;Long Term: Able to use THRR to govern intensity when exercising independently;Short Term: Able to use daily as guideline for intensity in rehab       Able to check pulse independently Yes       Intervention Provide education and demonstration on how to check pulse in carotid and radial arteries.;Review the importance of being able to check your own pulse for safety during independent exercise       Expected Outcomes Long Term: Able to check pulse independently and accurately;Short Term: Able to explain why pulse checking is important during independent exercise       Understanding of Exercise Prescription Yes       Intervention Provide education, explanation, and written materials on patient's individual exercise prescription       Expected Outcomes Long Term: Able to explain home exercise prescription to exercise independently;Short Term: Able to explain program exercise prescription          Exercise Goals Re-Evaluation :  Exercise Goals Re-Evaluation     Row Name 03/13/24 1109 04/10/24 1138 05/10/24 1440  Exercise Goal Re-Evaluation   Exercise Goals Review Able to understand and use rate of perceived exertion (RPE) scale;Knowledge and understanding of Target Heart Rate Range (THRR) Increase Physical Activity;Increase Strength and Stamina;Able to understand and use Dyspnea scale;Able to understand and use rate of perceived exertion (RPE) scale;Knowledge and understanding of Target Heart Rate Range (THRR);Able to check pulse independently;Understanding of Exercise Prescription Increase Physical Activity;Increase Strength and Stamina;Understanding of Exercise Prescription     Comments Reviewed RPE and dyspnea scale, THR  and program prescription with pt today.  Pt voiced understanding and was given a copy of goals to take home. Adean states she doesn't do any exercise at home. She is is independent with her ADL's for her age and still drives. She comes here two days a week, she wants to change to the afternoon class because she is not much of a morning person. Adean has done well in rehab . She does not exercise at home but she is independent at home and still drives. She does need some help and is slower.     Expected Outcomes Short: Use RPE daily to regulate intensity.  Long: Follow program prescription in THR. Short: Continue to attend rehab. Long: Try to incorprate exercise at home, but stay as active and independent as she can. Short: Continue to attend rehab. Long: Try to incorprate exercise at home, but stay as active and independent as she can.        Discharge Exercise Prescription (Final Exercise Prescription Changes):  Exercise Prescription Changes - 04/05/24 1500       Response to Exercise   Blood Pressure (Admit) 142/66    Blood Pressure (Exit) 110/62    Heart Rate (Admit) 78 bpm    Heart Rate (Exercise) 80 bpm    Heart Rate (Exit) 65 bpm    Oxygen  Saturation (Admit) 88 %    Oxygen  Saturation (Exercise) 90 %    Oxygen  Saturation (Exit) 97 %    Rating of Perceived Exertion (Exercise) 15    Perceived Dyspnea (Exercise) 2    Duration Continue with 30 min of aerobic exercise without signs/symptoms of physical distress.    Intensity THRR unchanged      Progression   Progression Continue to progress workloads to maintain intensity without signs/symptoms of physical distress.      Resistance Training   Weight 3    Reps 10-15      Oxygen    Oxygen  Continuous    Liters 2      NuStep   Level 1    SPM 60    Minutes 35    METs 1.5      Oxygen    Maintain Oxygen  Saturation 88% or higher          Nutrition:  Target Goals: Understanding of nutrition guidelines, daily intake of sodium  1500mg , cholesterol 200mg , calories 30% from fat and 7% or less from saturated fats, daily to have 5 or more servings of fruits and vegetables.  Biometrics:  Pre Biometrics - 03/06/24 1525       Pre Biometrics   Height 5' 7 (1.702 m)    Weight 123 lb 14.4 oz (56.2 kg)    Waist Circumference 29.5 inches    Hip Circumference 36 inches    Waist to Hip Ratio 0.82 %    BMI (Calculated) 19.4    Grip Strength 9.8 kg    Single Leg Stand 1.2 seconds           Nutrition Therapy Plan  and Nutrition Goals:  Nutrition Therapy & Goals - 03/06/24 1526       Intervention Plan   Intervention Prescribe, educate and counsel regarding individualized specific dietary modifications aiming towards targeted core components such as weight, hypertension, lipid management, diabetes, heart failure and other comorbidities.;Nutrition handout(s) given to patient.    Expected Outcomes Short Term Goal: Understand basic principles of dietary content, such as calories, fat, sodium, cholesterol and nutrients.;Long Term Goal: Adherence to prescribed nutrition plan.          Nutrition Assessments:  MEDIFICTS Score Key: >=70 Need to make dietary changes  40-70 Heart Healthy Diet <= 40 Therapeutic Level Cholesterol Diet   Picture Your Plate Scores: <59 Unhealthy dietary pattern with much room for improvement. 41-50 Dietary pattern unlikely to meet recommendations for good health and room for improvement. 51-60 More healthful dietary pattern, with some room for improvement.  >60 Healthy dietary pattern, although there may be some specific behaviors that could be improved.    Nutrition Goals Re-Evaluation:  Nutrition Goals Re-Evaluation     Row Name 04/10/24 1133 05/10/24 1440           Goals   Nutrition Goal Increase protein intake. healthy eating      Comment Adean is doing well in rehab. She states she doesn't have much of an appetite. She eats about 1 good meal per day, and the rest of the day  she snacks. She does try to drink an ensure per day also. She keeps a bottle of water with her at all times. She has to be careful with her water intake due to her heart and lung issues. Adean is doing well in rehab. She does not have much of an appetite and only really eats one good meal a day. She does snack some during the day and will drink an ensure. She is trying to drink more water and keeps one filled with her throughtout the day.      Expected Outcome Short: Continue to increase protein intake. Long: Keep adequate water intake. Short: Continue to increase protein intake. Long: Keep adequate water intake.         Nutrition Goals Discharge (Final Nutrition Goals Re-Evaluation):  Nutrition Goals Re-Evaluation - 05/10/24 1440       Goals   Nutrition Goal healthy eating    Comment Adean is doing well in rehab. She does not have much of an appetite and only really eats one good meal a day. She does snack some during the day and will drink an ensure. She is trying to drink more water and keeps one filled with her throughtout the day.    Expected Outcome Short: Continue to increase protein intake. Long: Keep adequate water intake.          Psychosocial: Target Goals: Acknowledge presence or absence of significant depression and/or stress, maximize coping skills, provide positive support system. Participant is able to verbalize types and ability to use techniques and skills needed for reducing stress and depression.  Initial Review & Psychosocial Screening:  Initial Psych Review & Screening - 02/23/24 1334       Initial Review   Current issues with None Identified      Family Dynamics   Good Support System? Yes    Comments Patient has a large extended family that supports her.      Barriers   Psychosocial barriers to participate in program The patient should benefit from training in stress management and relaxation.;There are no identifiable  barriers or psychosocial needs.       Screening Interventions   Interventions Encouraged to exercise;To provide support and resources with identified psychosocial needs;Provide feedback about the scores to participant    Expected Outcomes Short Term goal: Utilizing psychosocial counselor, staff and physician to assist with identification of specific Stressors or current issues interfering with healing process. Setting desired goal for each stressor or current issue identified.;Long Term Goal: Stressors or current issues are controlled or eliminated.;Short Term goal: Identification and review with participant of any Quality of Life or Depression concerns found by scoring the questionnaire.;Long Term goal: The participant improves quality of Life and PHQ9 Scores as seen by post scores and/or verbalization of changes          Quality of Life Scores:  Scores of 19 and below usually indicate a poorer quality of life in these areas.  A difference of  2-3 points is a clinically meaningful difference.  A difference of 2-3 points in the total score of the Quality of Life Index has been associated with significant improvement in overall quality of life, self-image, physical symptoms, and general health in studies assessing change in quality of life.   PHQ-9: Review Flowsheet       03/06/2024 01/19/2014 01/05/2014 05/18/2013  Depression screen PHQ 2/9  Decreased Interest 0 0 0 0  Down, Depressed, Hopeless 0 0 0 0  PHQ - 2 Score 0 0 0 0  Altered sleeping 0 - - -  Tired, decreased energy 3 - - -  Change in appetite 3 - - -  Feeling bad or failure about yourself  0 - - -  Trouble concentrating 0 - - -  Moving slowly or fidgety/restless 0 - - -  Suicidal thoughts 0 - - -  PHQ-9 Score 6 - - -  Difficult doing work/chores Somewhat difficult - - -   Interpretation of Total Score  Total Score Depression Severity:  1-4 = Minimal depression, 5-9 = Mild depression, 10-14 = Moderate depression, 15-19 = Moderately severe depression, 20-27 =  Severe depression   Psychosocial Evaluation and Intervention:  Psychosocial Evaluation - 02/23/24 1335       Psychosocial Evaluation & Interventions   Interventions Stress management education;Relaxation education;Encouraged to exercise with the program and follow exercise prescription    Comments Patient was referred to [facility] with chronic respiratory failure with hypoxia; pulmonary HTN and ILD. She denies any depression or anxiety. She says she sleeps well at night using Alprazalam. Her grandson lives in her basement but she says she rarely sees him, but he is there if she needs him. 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 She is looking forward to participating in the program. Her goals are to get stronger in her legs and feet and to improve her confidence in her ability to do things. She has no barriers identified to complete the program.    Expected Outcomes Short Term: Patient will start the program and attend consistently. Long Term: Patient will complete the program meeting personal goals.    Continue Psychosocial Services  Follow up required by staff          Psychosocial Re-Evaluation:  Psychosocial Re-Evaluation     Row Name 04/10/24 1130             Psychosocial Re-Evaluation   Current issues with None Identified  Comments Adean identifies no current stressors in her life at the moment. She is sleeps well also.       Expected Outcomes Short: Continue to attend rehab. Long: Continue to have stress free lifestyle.       Interventions Encouraged to attend Pulmonary Rehabilitation for the exercise       Continue Psychosocial Services  Follow up required by staff          Psychosocial Discharge (Final Psychosocial Re-Evaluation):  Psychosocial Re-Evaluation - 04/10/24 1130       Psychosocial Re-Evaluation   Current issues with None Identified     Comments Adean identifies no current stressors in her life at the moment. She is sleeps well also.    Expected Outcomes Short: Continue to attend rehab. Long: Continue to have stress free lifestyle.    Interventions Encouraged to attend Pulmonary Rehabilitation for the exercise    Continue Psychosocial Services  Follow up required by staff           Education: Education Goals: Education classes will be provided on a weekly basis, covering required topics. Participant will state understanding/return demonstration of topics presented.  Learning Barriers/Preferences:  Learning Barriers/Preferences - 02/23/24 1318       Learning Barriers/Preferences   Learning Barriers None    Learning Preferences Audio;Skilled Demonstration          Education Topics: How Lungs Work and Diseases: - Discuss the anatomy of the lungs and diseases that can affect the lungs, such as COPD.   Exercise: -Discuss the importance of exercise, FITT principles of exercise, normal and abnormal responses to exercise, and how to exercise safely.   Environmental Irritants: -Discuss types of environmental irritants and how to limit exposure to environmental irritants.   Meds/Inhalers and oxygen : - Discuss respiratory medications, definition of an inhaler and oxygen , and the proper way to use an inhaler and oxygen .   Energy Saving Techniques: - Discuss methods to conserve energy and decrease shortness of breath when performing activities of daily living.    Bronchial Hygiene / Breathing Techniques: - Discuss breathing mechanics, pursed-lip breathing technique,  proper posture, effective ways to clear airways, and other functional breathing techniques   Cleaning Equipment: - Provides group verbal and written instruction about the health risks of elevated stress, cause of high stress, and healthy ways to reduce stress.   Nutrition I: Fats: - Discuss the types of cholesterol, what cholesterol does to the  body, and how cholesterol levels can be controlled.   Nutrition II: Labels: -Discuss the different components of food labels and how to read food labels.   Respiratory Infections: - Discuss the signs and symptoms of respiratory infections, ways to prevent respiratory infections, and the importance of seeking medical treatment when having a respiratory infection.   Stress I: Signs and Symptoms: - Discuss the causes of stress, how stress may lead to anxiety and depression, and ways to limit stress.   Stress II: Relaxation: -Discuss relaxation techniques to limit stress.   Oxygen  for Home/Travel: - Discuss how to prepare for travel when on oxygen  and proper ways to transport and store oxygen  to ensure safety.   Knowledge Questionnaire Score:  Knowledge Questionnaire Score - 03/06/24 1427       Knowledge Questionnaire Score   Pre Score 15/18          Core Components/Risk Factors/Patient Goals at Admission:  Personal Goals and Risk Factors at Admission - 03/06/24 1526       Core Components/Risk Factors/Patient Goals  on Admission    Weight Management Weight Maintenance;Yes;Weight Gain    Intervention Weight Management: Develop a combined nutrition and exercise program designed to reach desired caloric intake, while maintaining appropriate intake of nutrient and fiber, sodium and fats, and appropriate energy expenditure required for the weight goal.;Weight Management: Provide education and appropriate resources to help participant work on and attain dietary goals.    Admit Weight 123 lb 14.4 oz (56.2 kg)    Goal Weight: Short Term 125 lb (56.7 kg)    Goal Weight: Long Term 125 lb (56.7 kg)    Expected Outcomes Short Term: Continue to assess and modify interventions until short term weight is achieved;Long Term: Adherence to nutrition and physical activity/exercise program aimed toward attainment of established weight goal;Weight Maintenance: Understanding of the daily nutrition  guidelines, which includes 25-35% calories from fat, 7% or less cal from saturated fats, less than 200mg  cholesterol, less than 1.5gm of sodium, & 5 or more servings of fruits and vegetables daily;Weight Gain: Understanding of general recommendations for a high calorie, high protein meal plan that promotes weight gain by distributing calorie intake throughout the day with the consumption for 4-5 meals, snacks, and/or supplements    Improve shortness of breath with ADL's Yes    Intervention Provide education, individualized exercise plan and daily activity instruction to help decrease symptoms of SOB with activities of daily living.    Expected Outcomes Short Term: Improve cardiorespiratory fitness to achieve a reduction of symptoms when performing ADLs;Long Term: Be able to perform more ADLs without symptoms or delay the onset of symptoms    Increase knowledge of respiratory medications and ability to use respiratory devices properly  Yes    Intervention Provide education and demonstration as needed of appropriate use of medications, inhalers, and oxygen  therapy.    Expected Outcomes Short Term: Achieves understanding of medications use. Understands that oxygen  is a medication prescribed by physician. Demonstrates appropriate use of inhaler and oxygen  therapy.;Long Term: Maintain appropriate use of medications, inhalers, and oxygen  therapy.    Hypertension Yes    Intervention Provide education on lifestyle modifcations including regular physical activity/exercise, weight management, moderate sodium restriction and increased consumption of fresh fruit, vegetables, and low fat dairy, alcohol  moderation, and smoking cessation.;Monitor prescription use compliance.    Expected Outcomes Short Term: Continued assessment and intervention until BP is < 140/77mm HG in hypertensive participants. < 130/55mm HG in hypertensive participants with diabetes, heart failure or chronic kidney disease.;Long Term: Maintenance of  blood pressure at goal levels.          Core Components/Risk Factors/Patient Goals Review:   Goals and Risk Factor Review     Row Name 04/10/24 1136 05/10/24 1444           Core Components/Risk Factors/Patient Goals Review   Personal Goals Review Lipids;Heart Failure;Develop more efficient breathing techniques such as purse lipped breathing and diaphragmatic breathing and practicing self-pacing with activity.;Increase knowledge of respiratory medications and ability to use respiratory devices properly.;Improve shortness of breath with ADL's Lipids;Heart Failure;Develop more efficient breathing techniques such as purse lipped breathing and diaphragmatic breathing and practicing self-pacing with activity.;Increase knowledge of respiratory medications and ability to use respiratory devices properly.;Improve shortness of breath with ADL's      Review Adean is doing well in rehab. She takes all her medicines as prescribed. She wears her 2L of oxygen  all the time, even at night. She checks her BP at home as well. Nichola is doing okay in rehab . She  has been taking her medications as prescribed and wears her O2 at all times and is on 2L. She deos check her BP at home and numbers has been WNL      Expected Outcomes Short: Continue to attend rehab. Long: Continue checking vitals at home and report any abnormalities. Short: Continue to attend rehab. Long: Continue checking vitals at home and report any abnormalities.         Core Components/Risk Factors/Patient Goals at Discharge (Final Review):   Goals and Risk Factor Review - 05/10/24 1444       Core Components/Risk Factors/Patient Goals Review   Personal Goals Review Lipids;Heart Failure;Develop more efficient breathing techniques such as purse lipped breathing and diaphragmatic breathing and practicing self-pacing with activity.;Increase knowledge of respiratory medications and ability to use respiratory devices properly.;Improve shortness of  breath with ADL's    Review Adena is doing okay in rehab . She has been taking her medications as prescribed and wears her O2 at all times and is on 2L. She deos check her BP at home and numbers has been WNL    Expected Outcomes Short: Continue to attend rehab. Long: Continue checking vitals at home and report any abnormalities.          ITP Comments:  ITP Comments     Row Name 02/23/24 1341 03/06/24 1425 03/13/24 1108 03/21/24 0943 04/18/24 1318   ITP Comments Virtual orientation visit completed for pulmonary rehab with chronic respiratory failure/pulmonary HTN and ILD. On-site orientation visit scheduled for 02/28/24 at 2:15. Patient attend orientation today.  Patient is attending Pulmonary Rehabilitation Program.  Documentation for diagnosis can be found in [facility].  Reviewed medical chart, RPE/RPD, gym safety, and program guidelines.  Patient was fitted to equipment they will be using during rehab.  Patient is scheduled to start exercise on 03/13/24.   Initial ITP created and sent for review and signature by Dr. Anton Kelp, Medical Director for Pulmonary Rehabilitation Program. First full day of exercise!  Patient was oriented to gym and equipment including functions, settings, policies, and procedures.  Patient's individual exercise prescription and treatment plan were reviewed.  All starting workloads were established based on the results of the 6 minute walk test done at initial orientation visit.  The plan for exercise progression was also introduced and progression will be customized based on patient's performance and goals. 30 day review completed. ITP sent to Dr.Jehanzeb Memon, Medical Director of  Pulmonary Rehab. Continue with ITP unless changes are made by physician.  Still new to program 30 day review completed. ITP sent to Dr.Jehanzeb Memon, Medical Director of  Pulmonary Rehab. Continue with ITP unless changes are made by physician.    Row Name 05/16/24 1103           ITP Comments  30 day review completed. ITP sent to Dr.Jehanzeb Memon, Medical Director of  Pulmonary Rehab. Continue with ITP unless changes are made by physician.          Comments: 30 day review      [1]  Current Outpatient Medications:    acetaminophen  (TYLENOL ) 650 MG CR tablet, Take 650 mg by mouth every 8 (eight) hours as needed for pain or fever., Disp: , Rfl:    albuterol  (PROVENTIL ) (2.5 MG/3ML) 0.083% nebulizer solution, Take 3 mLs (2.5 mg total) by nebulization every 6 (six) hours as needed for wheezing or shortness of breath., Disp: 75 mL, Rfl: 5   albuterol  (PROVENTIL ) (2.5 MG/3ML) 0.083% nebulizer solution, Take 3 mLs (2.5  mg total) by nebulization every 6 (six) hours as needed for wheezing or shortness of breath. 2-3 times a week, Disp: 75 mL, Rfl: 0   ALPRAZolam  (XANAX ) 0.5 MG tablet, Take 1 tablet (0.5 mg total) by mouth at bedtime as needed for anxiety., Disp: 30 tablet, Rfl: 3   apixaban  (ELIQUIS ) 2.5 MG TABS tablet, Take 1 tablet (2.5 mg total) by mouth 2 (two) times daily., Disp: 180 tablet, Rfl: 1   Cholecalciferol  50 MCG (2000 UT) CAPS, Take 1 capsule by mouth daily., Disp: , Rfl:    clobetasol (TEMOVATE) 0.05 % external solution, Apply 1 Application topically 2 (two) times daily., Disp: , Rfl:    cyanocobalamin (VITAMIN B12) 1000 MCG tablet, Take 1,000 mcg by mouth daily., Disp: , Rfl:    FARXIGA  10 MG TABS tablet, TAKE 1 TABLET BY MOUTH DAILY, Disp: 90 tablet, Rfl: 3   fentaNYL  (DURAGESIC ) 25 MCG/HR, Place 1 patch onto the skin every 3 (three) days., Disp: , Rfl:    Fluocinolone Acetonide 0.01 % OIL, Place 5 drops into both ears as needed (itching). As needed for itching, Disp: , Rfl:    levothyroxine  (SYNTHROID ) 137 MCG tablet, Take 137 mcg by mouth daily., Disp: , Rfl:    metoprolol  succinate (TOPROL -XL) 25 MG 24 hr tablet, Take 0.5-1 tablets (12.5-25 mg total) by mouth in the morning and at bedtime. Patient takes 1 tablet (25mg ) in the morning and 1/2 of a tablet (12.5mg ) in the  evening (Patient taking differently: Take 25 mg by mouth daily. Takes 1/2), Disp: , Rfl:    Multiple Vitamins-Minerals (PRESERVISION AREDS PO), Take 1 capsule by mouth daily., Disp: , Rfl:    NYAMYC powder, Apply 1 Application topically 2 (two) times daily., Disp: , Rfl:    ondansetron  (ZOFRAN ) 4 MG tablet, Take 4 mg by mouth every 8 (eight) hours as needed for nausea or vomiting., Disp: , Rfl:    Polyethyl Glycol-Propyl Glycol (SYSTANE OP), Apply 1 drop to eye daily., Disp: , Rfl:    potassium chloride  (KLOR-CON  M) 10 MEQ tablet, Take 10 mEq by mouth daily., Disp: , Rfl:    Probiotic Product (ALIGN) 4 MG CAPS, Take 1 tablet by mouth every morning. (Patient not taking: Reported on 03/21/2024), Disp: , Rfl:    senna (SENOKOT) 8.6 MG tablet, Take 2 tablets by mouth as needed for constipation., Disp: , Rfl:    sodium chloride  (OCEAN) 0.65 % nasal spray, Place 2 sprays into the nose daily as needed for congestion., Disp: , Rfl:    torsemide  (DEMADEX ) 20 MG tablet, Take 20 mg (1 tablet) daily alternating with 40 mg (2 tablets), Disp: , Rfl:  [2]  Social History Tobacco Use  Smoking Status Never   Passive exposure: Never  Smokeless Tobacco Never

## 2024-05-17 ENCOUNTER — Encounter (HOSPITAL_COMMUNITY)

## 2024-05-22 ENCOUNTER — Encounter (HOSPITAL_COMMUNITY)

## 2024-05-23 ENCOUNTER — Encounter (HOSPITAL_COMMUNITY): Payer: Self-pay

## 2024-05-24 ENCOUNTER — Encounter (HOSPITAL_COMMUNITY)

## 2024-05-29 ENCOUNTER — Encounter (HOSPITAL_COMMUNITY)

## 2024-05-31 ENCOUNTER — Encounter (HOSPITAL_COMMUNITY)

## 2024-06-05 ENCOUNTER — Encounter (HOSPITAL_COMMUNITY)

## 2024-06-07 ENCOUNTER — Encounter (HOSPITAL_COMMUNITY)

## 2024-06-12 ENCOUNTER — Encounter (HOSPITAL_COMMUNITY)

## 2024-06-14 ENCOUNTER — Encounter (HOSPITAL_COMMUNITY)

## 2024-06-19 ENCOUNTER — Encounter (HOSPITAL_COMMUNITY)

## 2024-06-21 ENCOUNTER — Encounter (HOSPITAL_COMMUNITY)

## 2024-06-22 ENCOUNTER — Ambulatory Visit

## 2024-06-26 ENCOUNTER — Encounter (HOSPITAL_COMMUNITY)

## 2024-06-26 ENCOUNTER — Ambulatory Visit: Admitting: Pulmonary Disease

## 2024-06-28 ENCOUNTER — Encounter (HOSPITAL_COMMUNITY)

## 2024-07-03 ENCOUNTER — Encounter (HOSPITAL_COMMUNITY)

## 2024-07-05 ENCOUNTER — Encounter (HOSPITAL_COMMUNITY)

## 2024-07-10 ENCOUNTER — Encounter (HOSPITAL_COMMUNITY)

## 2024-07-12 ENCOUNTER — Encounter (HOSPITAL_COMMUNITY)
